# Patient Record
Sex: Female | Born: 1965 | Race: White | Hispanic: No | Smoking: Current every day smoker
Health system: Southern US, Community
[De-identification: ages and names within clinical notes are randomized; demographics above are authoritative.]

## PROBLEM LIST (undated history)

## (undated) VITALS — BP 94/63 | HR 89 | Temp 97.5°F | Resp 16

## (undated) DIAGNOSIS — F419 Anxiety disorder, unspecified: Secondary | ICD-10-CM

## (undated) DIAGNOSIS — F329 Major depressive disorder, single episode, unspecified: Secondary | ICD-10-CM

## (undated) DIAGNOSIS — N83209 Unspecified ovarian cyst, unspecified side: Secondary | ICD-10-CM

## (undated) DIAGNOSIS — F32A Depression, unspecified: Secondary | ICD-10-CM

## (undated) DIAGNOSIS — K219 Gastro-esophageal reflux disease without esophagitis: Secondary | ICD-10-CM

## (undated) DIAGNOSIS — O2230 Deep phlebothrombosis in pregnancy, unspecified trimester: Secondary | ICD-10-CM

## (undated) HISTORY — PX: APPENDECTOMY: SHX54

## (undated) HISTORY — DX: Depression, unspecified: F32.A

## (undated) HISTORY — DX: Gastro-esophageal reflux disease without esophagitis: K21.9

## (undated) HISTORY — DX: Anxiety disorder, unspecified: F41.9

## (undated) HISTORY — DX: Major depressive disorder, single episode, unspecified: F32.9

## (undated) HISTORY — PX: TONSILLECTOMY: SUR1361

---

## 2006-10-05 ENCOUNTER — Ambulatory Visit (HOSPITAL_COMMUNITY): Payer: Self-pay | Admitting: Psychiatry

## 2006-11-02 ENCOUNTER — Ambulatory Visit (HOSPITAL_COMMUNITY): Payer: Self-pay | Admitting: Psychiatry

## 2006-11-30 ENCOUNTER — Ambulatory Visit (HOSPITAL_COMMUNITY): Payer: Self-pay | Admitting: Psychiatry

## 2007-07-13 ENCOUNTER — Ambulatory Visit (HOSPITAL_COMMUNITY): Payer: Self-pay | Admitting: Psychiatry

## 2007-08-25 ENCOUNTER — Emergency Department (HOSPITAL_COMMUNITY): Admission: EM | Admit: 2007-08-25 | Discharge: 2007-08-25 | Payer: Self-pay | Admitting: Emergency Medicine

## 2007-08-27 ENCOUNTER — Emergency Department (HOSPITAL_COMMUNITY): Admission: EM | Admit: 2007-08-27 | Discharge: 2007-08-27 | Payer: Self-pay | Admitting: Emergency Medicine

## 2007-08-29 ENCOUNTER — Emergency Department (HOSPITAL_COMMUNITY): Admission: EM | Admit: 2007-08-29 | Discharge: 2007-08-29 | Payer: Self-pay | Admitting: Emergency Medicine

## 2007-08-29 ENCOUNTER — Inpatient Hospital Stay (HOSPITAL_COMMUNITY): Admission: AD | Admit: 2007-08-29 | Discharge: 2007-09-01 | Payer: Self-pay | Admitting: Psychiatry

## 2007-08-31 ENCOUNTER — Ambulatory Visit: Payer: Self-pay | Admitting: Psychiatry

## 2007-09-03 ENCOUNTER — Ambulatory Visit (HOSPITAL_COMMUNITY): Payer: Self-pay | Admitting: Psychiatry

## 2008-05-31 ENCOUNTER — Emergency Department (HOSPITAL_COMMUNITY): Admission: EM | Admit: 2008-05-31 | Discharge: 2008-05-31 | Payer: Self-pay | Admitting: Family Medicine

## 2008-06-24 ENCOUNTER — Emergency Department (HOSPITAL_COMMUNITY): Admission: EM | Admit: 2008-06-24 | Discharge: 2008-06-25 | Payer: Self-pay | Admitting: *Deleted

## 2008-09-29 ENCOUNTER — Emergency Department (HOSPITAL_COMMUNITY): Admission: EM | Admit: 2008-09-29 | Discharge: 2008-09-29 | Payer: Self-pay | Admitting: Family Medicine

## 2010-12-17 NOTE — H&P (Signed)
Kristina Owens, Kristina Owens NO.:  [id number]   MEDICAL RECORD NO.:  [id number]          PATIENT TYPE:  IPS   LOCATION:  0502                          FACILITY:  [facility]   PHYSICIAN:  Geoffery Lyons, M.D.      DATE OF BIRTH:  05-Mar-1966   DATE OF ADMISSION:  08/29/2007  DATE OF DISCHARGE:                       PSYCHIATRIC ADMISSION ASSESSMENT   IDENTIFYING INFORMATION:  A 45 year old divorced white female.  This is  a voluntary admission.   HISTORY OF PRESENT ILLNESS:  This 45 year old requested admission for  help detoxing from alcohol after 3 visits to the emergency room over the  course of the past week when she was trying to quit alcohol on her own.  She reports she had 18 months of abstinence from all substances until a  little more than a week ago when she had been suffering previously from  a dental abscess, had been prescribed Vicodin and completed the Vicodin.  At that poin, she began to develop acute cravings for alcohol and began  to drink.  Currently is drinking between 12 and 18 beers daily.  Having  some suicidal thoughts of possibly wanting to overdose on some  medications.  Did not really abuse the Vicodin.  Denies other substance  abuse   PAST PSYCHIATRIC HISTORY:  The patient is a current the patient of Jorje Guild, P.A., and Arrie Senate, her psychotherapist at [facility] outpatient clinic.  This is her first inpatient admission at [facility].  She has a history of one admission in [place], [place] for detox  preceding her 18 months of abstinence ending this past week.  Also,  another admission in December of 2005 to [facility] for 28 days,  followed by 4 months of abstinence.  She has been on naltrexone in the  past which helped her maintain abstinence.   SOCIAL HISTORY:  Divorced white female endorsing some chronic conflict  with her ex-husband, has a 54-1/2-year-old son who visits his father in  [place] in summers and an on school holidays.   Otherwise, lives full  time with the patient.  She is employed full-time by an Tour manager.  Is also a Physicist, medical at [employer]  Sister lives nearby  and is supportive and currently caring for her son.  The patient moved  to [place] a couple of years ago in order to be closer to family  after separating from her husband.  No legal charges.   FAMILY HISTORY:  Is remarkable for father with history of alcohol abuse  and depression.   ALCOHOL AND DRUG HISTORY:  Noted above.   MEDICAL HISTORY:  The patient currently followed by Dr. Merri Brunette.  Medical problems are none.   CURRENT MEDICATIONS:  1. Effexor XR 150 mg daily.  2. Lexapro 30 mg daily.  3. BuSpar 7.5 mg p.o. b.i.d.   Has been on this combination for several months, with the Effexor being  the last added medication. She also takes Depo-Provera injections every  3 months.   DRUG ALLERGIES:  Are none.   POSITIVE PHYSICAL FINDINGS:  Physical exam done in the emergency room:  She had been prescribed some Ativan in the emergency room this past week  to help with outpatient detox.  On admission to our unit, 5 feet 2  inches tall, 133 pounds, temperature 97.7, pulse 75, respirations 22,  blood pressure 135/93.  Physical exam unremarkable.  Does have a belly  button ring and a tattoo across her sacrum.   DIAGNOSTIC STUDIES:  Urine drug screen was positive for benzodiazepines,  negative for all other substances.  TSH is 1.951.  Routine urinalysis is  remarkable for 7-10 WBCs per high-powered field and moderate leukocytes,  trace blood, specific gravity is 1.012 and pH 5.50.  Urine pregnancy  test is negative.  Alcohol level was 6.  CBC:  WBC 10.6, hemoglobin 13,  hematocrit 38.2 and platelets 347,000.  Her MCV 86.6.  Chemistry:  Sodium 139, potassium 3.8, chloride 106, carbon dioxide 26, BUN 9,  creatinine 0.75, and random glucose 78.  Liver enzymes:  SGOT 23, SGPT  21, alkaline phosphatase 73, and total  bilirubin is 0.7.   MENTAL STATUS EXAM:  Fully alert female, pleasant cooperative, composed,  gives a coherent history.  Speech is normal in form, pace and tone.  Mood is depressed.  Thought process is remarkable for some passive  suicidal thoughts, is feeling that her addiction is controlling her  instead of her in control.  No active suicidal thoughts today, does not  have a plan, is deterred from suicide out of her concerns for her son,  does admit to doing some ruminating thought, constantly worrying about  her son even during periods of abstinence.  Cognition is preserved.  She  is oriented x4.  Insight adequate.  Impulse control and judgment within  normal limits.  Registration and attention are intact.  She is anxious  to re-achieve abstinence, felt good during abstinence, and generally  functioned quite well.  Wants to achieve that.  Regrets ever taking the  Vicodin.  Also would like to consider going back on the naltrexone.   AXIS I:  Depressive disorder, not otherwise specified.  EtOH abuse, rule  out dependence.  AXIS II:  Deferred.  AXIS III:  Pyuria, rule out urinary tract infection.  AXIS IV:  Moderate chronic parenting stress.  AXIS V:  Current is 45.  Past year 68.   PLAN:  Is to voluntarily admit the patient with every 15-minute checks  in place with a goal of a safe detox.  We are going to continue her  current antidepressant regimen and will start her on naltrexone 25 mg  daily.  We have placed her on a Librium detox protocol which she is  tolerating well and have started her on naltrexone 25 mg daily and will  add Risperdal 0.25 mg b.i.d. to help her with ruminating thoughts.  Estimated length of stay is 4 days.      Margaret A. Scott, N.P.      Geoffery Lyons, M.D.  Electronically Signed    MAS/MEDQ  D:  08/30/2007  T:  08/31/2007  Job:  [id number]

## 2010-12-20 NOTE — Discharge Summary (Signed)
Kristina Owens, Kristina Owens           ACCOUNT NO.:  [id number]   MEDICAL RECORD NO.:  [id number]          PATIENT TYPE:  IPS   LOCATION:  0502                          FACILITY:  BH   PHYSICIAN:  Geoffery Lyons, M.D.      DATE OF BIRTH:  July 18, 1966   DATE OF ADMISSION:  08/29/2007  DATE OF DISCHARGE:  09/01/2007                               DISCHARGE SUMMARY   CHIEF COMPLAINT AND HISTORY OF PRESENT ILLNESS:  This was the first  admission to [facility] for this 45 year old divorced  white female admitted for detox from alcohol after three visits to the  ED when she was trying to quit alcohol on her own.  She had in the past  18 months of abstinence until a little more than a week ago when she had  been suffering previously from a dental abscess.  She was prescribed  Vicodin, completed the Vicodin course and at that time started having  cravings for alcohol and began to drink, drinking between 12-18 beers.   PAST PSYCHIATRIC HISTORY:  Active at [facility]  outpatient clinic, seeing Heron Sabins and Jorje Guild.  Prior admission in  [place], [place], for detox preceding her 18 months of abstinence.   SOCIAL HISTORY:  As stated, persistent use of alcohol after 18 months of  abstinence.   MEDICAL HISTORY:  Noncontributory.   MEDICATIONS:  1. Effexor XR 150 mg per day.  2. Lexapro 30 mg per day.  3. BuSpar 7.5 twice a day.   PHYSICAL EXAMINATION:  Exam failed to show any acute findings.   LABORATORY WORK:  White blood cells 10.6, hemoglobin 13.0, sodium 139,  potassium 3.8, glucose 78, SGOT 23, SGPT 21, total bilirubin 0.7, TSH  1.951, drug screen positive for benzodiazepines.   MENTAL STATUS EXAM:  On admission revealed a female that was  cooperative.  Speech was normal in tempo and production.  Mood was  depressed.  Affect was depressed.  Thought processes were logical,  coherent and relevant.  There were some passive suicidal thoughts,  feeling that  her addiction was controlling her instead of her being in  control, endorsed persistent worrying, ruminating about her son.  No  delusions.  No active suicidal ideas, no hallucinations.  Cognition well-  preserved.   ADMITTING DIAGNOSIS:  AXIS I:  Depressive disorder not otherwise  specified.  Rule out mood disorder NOS.  Alcohol dependence.  AXIS II:  No diagnosis.  AXIS III:  No diagnosis.  AXIS IV:  Moderate.  AXIS V:  Upon admission 35, highest GAF in the last year 70.   COURSE IN THE HOSPITAL:  She was admitted, started in individual and  group psychotherapy.  She was detoxified with Librium.  Did endorse  increased use of alcohol January 1 with this gum infection and given  Vicodin and this seemed to trigger the relapse on alcohol.  Has a 80-1/2-  year-old son in fifth grade.  There is some conflict with the ex-  husband.  Had been in the mortgage business for 13 years, taking online  courses for substance abuse, had been  seeing Heron Sabins for the whole  year, just moved from [place] and was active in [place] and had been in  [place], [place], Risk manager.  We started the Risperdal to see if  it would help with the ruminations.  She continued to worry that  something was going to happen, could get to be incapacitated and she  would not do anything due to the same.  We increased the BuSpar to 15  twice a day.  January 28 she endorsed she was better, wanting to go home  with the sister and continuing to work on abstinence, encouraged by the  possibility of the medication helping with the anxiety.  There was a  family session on this same date that went well.  There were no concerns  about safety.  She was fully detoxed.  We went ahead and discharged to  outpatient followup.   DISCHARGE DIAGNOSIS:  AXIS I:  Alcohol dependence, anxiety disorder not  otherwise specified, depressive disorder not otherwise specified.  AXIS II:  No diagnosis.  AXIS III:  No diagnosis.  AXIS IV:   Moderate.  AXIS V:  On discharge 50/55.   Discharged on Revia 50 mg per day, BuSpar 50 mg twice a day, Risperdal  0.5 twice a day, Effexor XR 150 mg per day, Lexapro 30 mg per day,  Librium 25 one the following day to complete the Librium detox protocol.  Follow up at the [facility].      Geoffery Lyons, M.D.  Electronically Signed     IL/MEDQ  D:  09/26/2007  T:  09/27/2007  Job:  [id number]

## 2011-04-09 ENCOUNTER — Emergency Department: Payer: Self-pay | Admitting: Emergency Medicine

## 2011-04-25 LAB — COMPREHENSIVE METABOLIC PANEL
ALT: 21
AST: 23
Albumin: 4.1
Alkaline Phosphatase: 73
BUN: 9
CO2: 26
Calcium: 9
Chloride: 106
Creatinine, Ser: 0.75
GFR calc Af Amer: >60
GFR calc non Af Amer: >60
Glucose, Bld: 78
Potassium: 3.8
Sodium: 139
Total Bilirubin: 0.7
Total Protein: 6.8

## 2011-04-25 LAB — BENZODIAZEPINE, QUANTITATIVE, URINE
Alprazolam (GC/LC/MS), ur confirm: NEGATIVE
Flurazepam GC/MS Conf: NEGATIVE
Nordiazepam GC/MS Conf: 110 ng/mL
Oxazepam GC/MS Conf: 130 ng/mL

## 2011-04-25 LAB — DRUGS OF ABUSE SCREEN W/O ALC, ROUTINE URINE
Amphetamine Screen, Ur: NEGATIVE
Barbiturate Quant, Ur: NEGATIVE
Benzodiazepines.: POSITIVE — AB
Cocaine Metabolites: NEGATIVE
Creatinine,U: 106
Marijuana Metabolite: NEGATIVE
Methadone: NEGATIVE
Opiate Screen, Urine: NEGATIVE
Phencyclidine (PCP): NEGATIVE
Propoxyphene: NEGATIVE

## 2011-04-25 LAB — DIFFERENTIAL
Basophils Absolute: 0
Basophils Relative: 0
Eosinophils Absolute: 0.6
Eosinophils Relative: 6 — ABNORMAL HIGH
Lymphocytes Relative: 45
Lymphs Abs: 3.9
Monocytes Absolute: 0.7
Monocytes Relative: 8
Neutro Abs: 3.6
Neutrophils Relative %: 41 — ABNORMAL LOW

## 2011-04-25 LAB — CBC
HCT: 38.2
HCT: 38.8
Hemoglobin: 13
Hemoglobin: 13.4
MCHC: 34.1
MCHC: 34.6
MCV: 86.1
MCV: 86.6
Platelets: 347
Platelets: 369
RBC: 4.4
RBC: 4.5
RDW: 13.3
RDW: 13.3
WBC: 10.6 — ABNORMAL HIGH
WBC: 8.7

## 2011-04-25 LAB — URINALYSIS, ROUTINE W REFLEX MICROSCOPIC
Bilirubin Urine: NEGATIVE
Glucose, UA: NEGATIVE
Ketones, ur: NEGATIVE
Nitrite: NEGATIVE
Protein, ur: NEGATIVE
Specific Gravity, Urine: 1.012
Urobilinogen, UA: 0.2
pH: 5.5

## 2011-04-25 LAB — URINE MICROSCOPIC-ADD ON

## 2011-04-25 LAB — ETHANOL
Alcohol, Ethyl (B): 247 — ABNORMAL HIGH
Alcohol, Ethyl (B): 6

## 2011-04-25 LAB — TSH: TSH: 1.951

## 2011-04-25 LAB — PREGNANCY, URINE: Preg Test, Ur: NEGATIVE

## 2011-05-05 ENCOUNTER — Emergency Department (HOSPITAL_COMMUNITY)
Admission: EM | Admit: 2011-05-05 | Discharge: 2011-05-06 | Disposition: A | Discharge status: Other Acute Inpatient Hospital | Payer: 59 | Attending: Emergency Medicine | Admitting: Emergency Medicine

## 2011-05-05 ENCOUNTER — Ambulatory Visit (HOSPITAL_COMMUNITY): Admission: RE | Admit: 2011-05-05 | Payer: 59 | Source: Home / Self Care | Admitting: Psychiatry

## 2011-05-05 DIAGNOSIS — Z046 Encounter for general psychiatric examination, requested by authority: Secondary | ICD-10-CM | POA: Insufficient documentation

## 2011-05-05 DIAGNOSIS — F329 Major depressive disorder, single episode, unspecified: Secondary | ICD-10-CM | POA: Insufficient documentation

## 2011-05-05 DIAGNOSIS — F3289 Other specified depressive episodes: Secondary | ICD-10-CM | POA: Insufficient documentation

## 2011-05-05 DIAGNOSIS — F1010 Alcohol abuse, uncomplicated: Secondary | ICD-10-CM | POA: Insufficient documentation

## 2011-05-05 DIAGNOSIS — R259 Unspecified abnormal involuntary movements: Secondary | ICD-10-CM | POA: Insufficient documentation

## 2011-05-05 LAB — RAPID URINE DRUG SCREEN, HOSP PERFORMED
Amphetamines: NOT DETECTED
Barbiturates: NOT DETECTED
Benzodiazepines: NOT DETECTED
Cocaine: NOT DETECTED
Opiates: NOT DETECTED
Tetrahydrocannabinol: NOT DETECTED

## 2011-05-05 LAB — CBC
HCT: 44.9 % (ref 36.0–46.0)
Hemoglobin: 15.2 g/dL — ABNORMAL HIGH (ref 12.0–15.0)
MCH: 30.5 pg (ref 26.0–34.0)
MCHC: 33.9 g/dL (ref 30.0–36.0)
MCV: 90 fL (ref 78.0–100.0)
Platelets: 299 10*3/uL (ref 150–400)
RBC: 4.99 MIL/uL (ref 3.87–5.11)
RDW: 12.4 % (ref 11.5–15.5)
WBC: 7.6 10*3/uL (ref 4.0–10.5)

## 2011-05-05 LAB — COMPREHENSIVE METABOLIC PANEL
ALT: 19 U/L (ref 0–35)
AST: 18 U/L (ref 0–37)
Albumin: 4.1 g/dL (ref 3.5–5.2)
Alkaline Phosphatase: 90 U/L (ref 39–117)
BUN: 8 mg/dL (ref 6–23)
CO2: 25 mEq/L (ref 19–32)
Calcium: 8.8 mg/dL (ref 8.4–10.5)
Chloride: 109 mEq/L (ref 96–112)
Creatinine, Ser: 0.63 mg/dL (ref 0.50–1.10)
GFR calc Af Amer: >90 mL/min (ref >90)
GFR calc non Af Amer: >90 mL/min (ref >90)
Glucose, Bld: 85 mg/dL (ref 70–99)
Potassium: 4.3 mEq/L (ref 3.5–5.1)
Sodium: 144 mEq/L (ref 135–145)
Total Bilirubin: 0.1 mg/dL — ABNORMAL LOW (ref 0.3–1.2)
Total Protein: 7.5 g/dL (ref 6.0–8.3)

## 2011-05-05 LAB — ETHANOL: Alcohol, Ethyl (B): 279 mg/dL — ABNORMAL HIGH (ref 0–11)

## 2011-05-05 LAB — DIFFERENTIAL
Basophils Absolute: 0 10*3/uL (ref 0.0–0.1)
Basophils Relative: 0 % (ref 0–1)
Eosinophils Absolute: 0.3 10*3/uL (ref 0.0–0.7)
Eosinophils Relative: 3 % (ref 0–5)
Lymphocytes Relative: 43 % (ref 12–46)
Lymphs Abs: 3.2 10*3/uL (ref 0.7–4.0)
Monocytes Absolute: 0.4 10*3/uL (ref 0.1–1.0)
Monocytes Relative: 5 % (ref 3–12)
Neutro Abs: 3.7 10*3/uL (ref 1.7–7.7)
Neutrophils Relative %: 48 % (ref 43–77)

## 2011-05-06 ENCOUNTER — Inpatient Hospital Stay (HOSPITAL_COMMUNITY)
Admission: AD | Admit: 2011-05-06 | Discharge: 2011-05-10 | DRG: 897 | Disposition: A | Discharge status: Home / Self Care | Payer: 59 | Source: Ambulatory Visit | Attending: Psychiatry | Admitting: Psychiatry

## 2011-05-06 DIAGNOSIS — F1320 Sedative, hypnotic or anxiolytic dependence, uncomplicated: Secondary | ICD-10-CM

## 2011-05-06 DIAGNOSIS — F429 Obsessive-compulsive disorder, unspecified: Secondary | ICD-10-CM

## 2011-05-06 DIAGNOSIS — F0781 Postconcussional syndrome: Secondary | ICD-10-CM

## 2011-05-06 DIAGNOSIS — F39 Unspecified mood [affective] disorder: Secondary | ICD-10-CM

## 2011-05-06 DIAGNOSIS — F1020 Alcohol dependence, uncomplicated: Principal | ICD-10-CM

## 2011-05-06 DIAGNOSIS — Z6379 Other stressful life events affecting family and household: Secondary | ICD-10-CM

## 2011-05-06 DIAGNOSIS — F17200 Nicotine dependence, unspecified, uncomplicated: Secondary | ICD-10-CM

## 2011-05-06 LAB — ETHANOL: Alcohol, Ethyl (B): <11 mg/dL (ref 0–11)

## 2011-05-07 DIAGNOSIS — F1020 Alcohol dependence, uncomplicated: Secondary | ICD-10-CM

## 2011-05-07 DIAGNOSIS — F429 Obsessive-compulsive disorder, unspecified: Secondary | ICD-10-CM

## 2011-05-07 DIAGNOSIS — F1320 Sedative, hypnotic or anxiolytic dependence, uncomplicated: Secondary | ICD-10-CM

## 2011-05-11 ENCOUNTER — Inpatient Hospital Stay: Payer: Self-pay | Admitting: Psychiatry
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
## 2011-07-13 ENCOUNTER — Encounter: Payer: Self-pay | Admitting: Emergency Medicine

## 2011-07-13 ENCOUNTER — Emergency Department (HOSPITAL_COMMUNITY)
Admission: EM | Admit: 2011-07-13 | Discharge: 2011-07-14 | Disposition: A | Discharge status: Home / Self Care | Payer: 59 | Attending: Emergency Medicine | Admitting: Emergency Medicine

## 2011-07-13 DIAGNOSIS — R45851 Suicidal ideations: Secondary | ICD-10-CM | POA: Insufficient documentation

## 2011-07-13 DIAGNOSIS — F1020 Alcohol dependence, uncomplicated: Secondary | ICD-10-CM | POA: Insufficient documentation

## 2011-07-13 LAB — CBC
HCT: 40.8 % (ref 36.0–46.0)
Hemoglobin: 14.1 g/dL (ref 12.0–15.0)
MCH: 29.4 pg (ref 26.0–34.0)
MCHC: 34.6 g/dL (ref 30.0–36.0)
MCV: 85.2 fL (ref 78.0–100.0)
Platelets: 303 10*3/uL (ref 150–400)
RBC: 4.79 MIL/uL (ref 3.87–5.11)
RDW: 12.8 % (ref 11.5–15.5)
WBC: 8.6 10*3/uL (ref 4.0–10.5)

## 2011-07-13 LAB — COMPREHENSIVE METABOLIC PANEL
ALT: 15 U/L (ref 0–35)
AST: 17 U/L (ref 0–37)
Albumin: 4.5 g/dL (ref 3.5–5.2)
Alkaline Phosphatase: 89 U/L (ref 39–117)
BUN: 9 mg/dL (ref 6–23)
CO2: 24 mEq/L (ref 19–32)
Calcium: 9.4 mg/dL (ref 8.4–10.5)
Chloride: 107 mEq/L (ref 96–112)
Creatinine, Ser: 0.66 mg/dL (ref 0.50–1.10)
GFR calc Af Amer: >90 mL/min (ref >90)
GFR calc non Af Amer: >90 mL/min (ref >90)
Glucose, Bld: 81 mg/dL (ref 70–99)
Potassium: 4.1 mEq/L (ref 3.5–5.1)
Sodium: 142 mEq/L (ref 135–145)
Total Bilirubin: 0.1 mg/dL — ABNORMAL LOW (ref 0.3–1.2)
Total Protein: 7.9 g/dL (ref 6.0–8.3)

## 2011-07-13 LAB — RAPID URINE DRUG SCREEN, HOSP PERFORMED
Amphetamines: NOT DETECTED
Barbiturates: NOT DETECTED
Benzodiazepines: NOT DETECTED
Cocaine: NOT DETECTED
Opiates: NOT DETECTED
Tetrahydrocannabinol: NOT DETECTED

## 2011-07-13 LAB — ETHANOL: Alcohol, Ethyl (B): 255 mg/dL — ABNORMAL HIGH (ref 0–11)

## 2011-07-13 MED ORDER — HYDROCODONE-ACETAMINOPHEN 5-325 MG PO TABS: 1.0000 | ORAL_TABLET | Freq: Four times a day (QID) | ORAL | Status: DC | PRN

## 2011-07-13 MED ORDER — LORAZEPAM 1 MG PO TABS
1.0000 mg | ORAL_TABLET | Freq: Three times a day (TID) | ORAL | Status: DC | PRN
  Administered 2011-07-14: 1 mg via ORAL
  Filled 2011-07-13 (×2): qty 1

## 2011-07-13 MED ORDER — ALUM & MAG HYDROXIDE-SIMETH 200-200-20 MG/5ML PO SUSP: 30.0000 mL | ORAL | Status: DC | PRN

## 2011-07-13 MED ORDER — ZOLPIDEM TARTRATE 5 MG PO TABS: 5.0000 mg | ORAL_TABLET | Freq: Every evening | ORAL | Status: DC | PRN

## 2011-07-13 MED ORDER — IBUPROFEN 600 MG PO TABS: 600.0000 mg | ORAL_TABLET | Freq: Three times a day (TID) | ORAL | Status: DC | PRN

## 2011-07-13 MED ORDER — ACETAMINOPHEN 325 MG PO TABS: 650.0000 mg | ORAL_TABLET | ORAL | Status: DC | PRN

## 2011-07-13 MED ORDER — ONDANSETRON HCL 4 MG PO TABS
4.0000 mg | ORAL_TABLET | Freq: Three times a day (TID) | ORAL | Status: DC | PRN
  Administered 2011-07-14: 4 mg via ORAL
  Filled 2011-07-13: qty 1

## 2011-07-13 MED ORDER — LORAZEPAM 1 MG PO TABS
1.0000 mg | ORAL_TABLET | Freq: Once | ORAL | Status: AC
  Administered 2011-07-13: 1 mg via ORAL
  Filled 2011-07-13: qty 1

## 2011-07-13 MED ORDER — NICOTINE 21 MG/24HR TD PT24
21.0000 mg | MEDICATED_PATCH | Freq: Every day | TRANSDERMAL | Status: DC
  Administered 2011-07-13: 21 mg via TRANSDERMAL
  Filled 2011-07-13: qty 1

## 2011-07-13 NOTE — ED Notes (Signed)
ETOH, "I think I have had to much to drink, and I need some help".

## 2011-07-13 NOTE — ED Notes (Signed)
FAO:[id number]<VH> Expected date:07/13/11<BR> Expected time: 6:23 PM<BR> Means of arrival:Ambulance<BR> Comments:<BR> EMS 12 GC - suicidal

## 2011-07-13 NOTE — ED Notes (Signed)
Report given to Julie-RN- pt was clearedby security  belonging bag x 2

## 2011-07-13 NOTE — ED Provider Notes (Addendum)
History     CSN: [id number] Arrival date & time: 07/13/2011  6:35 PM   First MD Initiated Contact with Patient 07/13/11 2042      Chief Complaint  Patient presents with  . Alcohol Problem    (Consider location/radiation/quality/duration/timing/severity/associated sxs/prior treatment) HPI Comments: Kristina Owens is an alcoholic, who had one month of sobriety [facility].  She did not follow through with AA meetings prior to obtaining a sponsor.  She now is drinking more eating, lasts and is contemplating suicide.   Patient is a 45 y.o. female presenting with alcohol problem. The history is provided by the patient.  Alcohol Problem This is a chronic problem. The current episode started more than 1 year ago. The problem occurs constantly. The problem has been unchanged. Pertinent negatives include no abdominal pain, anorexia, arthralgias, change in bowel habit, chest pain, chills, fatigue, fever, headaches, nausea or vomiting. The symptoms are aggravated by nothing. She has tried nothing for the symptoms. The treatment provided no relief.    No past medical history on file.  No past surgical history on file.  No family history on file.  History  Substance Use Topics  . Smoking status: Not on file  . Smokeless tobacco: Not on file  . Alcohol Use: Not on file    OB History    Grav Para Term Preterm Abortions TAB SAB Ect Mult Living                  Review of Systems  Constitutional: Negative for fever, chills and fatigue.  Eyes: Negative.   Respiratory: Negative.   Cardiovascular: Negative for chest pain.  Gastrointestinal: Negative for nausea, vomiting, abdominal pain, anorexia and change in bowel habit.  Musculoskeletal: Negative for arthralgias.  Skin: Negative.   Neurological: Negative for headaches.  Psychiatric/Behavioral: Positive for suicidal ideas.    Allergies  Review of patient's allergies indicates no known  allergies.  Home Medications   Current Outpatient Rx  Name Route Sig Dispense Refill  . ARIPIPRAZOLE 10 MG PO TABS Oral Take 10 mg by mouth daily.      Marland Kitchen ESCITALOPRAM OXALATE 20 MG PO TABS Oral Take 30 mg by mouth daily. Pt takes 1.5 tabs for 30mg  dose     . MULTI-VITAMIN/MINERALS PO TABS Oral Take 1 tablet by mouth daily.        BP 113/70  Pulse 94  Temp(Src) 98 F (36.7 C) (Oral)  Resp 20  Wt 142 lb (64.411 kg)  SpO2 98%  Physical Exam  Constitutional: She is oriented to person, place, and time. She appears well-developed and well-nourished.  HENT:  Head: Normocephalic.  Eyes: Pupils are equal, round, and reactive to light.  Neck: Neck supple.  Cardiovascular: Normal rate.   Pulmonary/Chest: Effort normal.  Abdominal: Soft.  Musculoskeletal: Normal range of motion.  Neurological: She is oriented to person, place, and time.  Skin: Skin is warm and dry.  Psychiatric: Her behavior is normal. Thought content normal. Her mood appears anxious. Her speech is not rapid and/or pressured and not slurred. Cognition and memory are normal. She expresses impulsivity. She is communicative.    ED Course  Procedures (including critical care time)  Labs Reviewed  COMPREHENSIVE METABOLIC PANEL - Abnormal; Notable for the following:    Total Bilirubin 0.1 (*)    All other components within normal limits  ETHANOL - Abnormal; Notable for the following:    Alcohol, Ethyl (B) 255 (*)    All  other components within normal limits  URINE RAPID DRUG SCREEN (HOSP PERFORMED)  CBC   No results found.   1. Alcohol dependence    Patient discharged to home after Dr. Paris Lore.  Evaluation  Seen by activity reassessed in the morning for possible placement in rehabilitation.  Facility. MDM  She states she wants to home to take care of her dogs, although she does have someone who can care for them discussed at length.  The importance of her taking care of herself.  The fact that she can not leave at  this point since she voiced suicidality.  She is willing to stay and be assessed.        Arman Filter, NP 07/13/11 1610  Arman Filter, NP 07/14/11 0630  Arman Filter, NP 07/14/11 2006

## 2011-07-13 NOTE — ED Notes (Signed)
PTA pt gave class ring to friend, Kristina Owens (sp)

## 2011-07-13 NOTE — ED Notes (Signed)
Pt states that she is not suicidal and does not want to stay here tonight. Dr. Preston Fleeting informed and he states that pt made statements on the phone to someone that she wanted to kill herself. Dr. Preston Fleeting states that if pt wants to leave she will be IVC'd.

## 2011-07-14 DIAGNOSIS — F1020 Alcohol dependence, uncomplicated: Secondary | ICD-10-CM

## 2011-07-14 DIAGNOSIS — F429 Obsessive-compulsive disorder, unspecified: Secondary | ICD-10-CM

## 2011-07-14 MED ORDER — LORAZEPAM 2 MG/ML IJ SOLN: 1.0000 mg | Freq: Four times a day (QID) | INTRAMUSCULAR | Status: DC | PRN

## 2011-07-14 MED ORDER — LORAZEPAM 1 MG PO TABS: 1.0000 mg | ORAL_TABLET | Freq: Four times a day (QID) | ORAL | Status: DC | PRN

## 2011-07-14 NOTE — Consult Note (Signed)
Patient Identification:  Kristina Owens Date of Evaluation:  07/14/2011   History of Present Illness:  Patient seen in the medical records reviewed. 45 year old Caucasian female who came to get the detox and further help from alcohol abuse. But reported to me today that she is feeling much better she [facility] and she know how to stay sober and wants to followup in the outpatient setting. Patient works in a bank and told me that by being admitted in the hospital she don't want to jeopardize her job. Patient currently has no withdrawal symptoms. She reported that she drinks 1-2 bottles of wine daily. Patient denied any past suicide attempt and she told me she is never going to hurt herself.   Her labs are within normal limits.  Mental Status Examination: Calm cooperative pleasant on approach. Not suicidal homicidal. Not hallucinating or delusional. Attention concentration good memory is good. Attention concentration good abstract ability intact insight and judgment intact.  Patient signed a Engineer, manufacturing systems and I told the counselor to go over all the suicide precautions and gave her all the option to followup in the outpatient setting.  Patient takes Abilify and Lexapro for OCD and follow psychiatrists in the outpatient setting regularly. 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   Past Medical History:    No past medical history on file.    No past surgical history on file.  Allergies: No Known Allergies  Current Medications:  Prior to Admission medications   Medication Sig Start Date End Date Taking? Authorizing Provider  ARIPiprazole (ABILIFY) 10 MG tablet Take 10 mg by mouth daily.     Yes Historical Provider, MD  escitalopram (LEXAPRO) 20 MG tablet Take 30 mg by mouth daily. Pt takes 1.5 tabs for 30mg  dose    Yes Historical Provider, MD  Multiple Vitamins-Minerals (MULTIVITAMIN WITH MINERALS) tablet Take 1 tablet by mouth daily.     Yes  Historical Provider, MD    Social History:    does not have a smoking history on file. She does not have any smokeless tobacco history on file. Her alcohol and drug histories not on file.   Family History:    No family history on file.   DIAGNOSIS:   AXIS I  chronic alcohol dependence, OCD   AXIS II  Deffered  AXIS III See medical notes.  AXIS IV  alcohol abuse   AXIS V 55     Recommendations:  Patient does not meet criteria to be admitted on  IVC she wants to be discharged to followup in the outpatient setting at this time patient mental status is stable and can be discharged to followup in the outpatient setting.   Eulogio Ditch, MD

## 2011-07-14 NOTE — ED Notes (Signed)
Patient signed no harm contract and was given outpatient referrals.

## 2011-07-14 NOTE — ED Provider Notes (Signed)
Evaluation and management procedures were performed by the PA/NP under my supervision/collaboration.   Dione Booze, MD 07/14/11 [id number]

## 2011-07-14 NOTE — BH Assessment (Signed)
Assessment Note   Kristina Owens is an 45 y.o. female. PT PRESENT INTOXICATED REQUESTING HELP & NEEDS DETOX. PT STATES SHE DRINKS TO DEAL WITH STRESSOR. PT DENIES ANY IDEATION EVENTHOUGHT EDP EXPRESSED SHE HEARD PT VOICE SUICIDAL IDEATION TO SISTER. PT ADMITS BEING UNDER A LOT FOR STRESS WITH FINANCIAL ISSUES. PT SAYS SHE HAS BEEN TO [facility] ADMISSION THEN [facility] MONTH[facility]. PT EXPRESSED THAT SHE DID NOT WANT TO CONTINUE LIVING THIS WAY & FELT HER DRINKING TENDS TO DEPRESS HER THE MORE. PT ADMITS TO DRINKING 2 BIG BOTTLE OF WINE DAILY & HAD 4 SMALL BOTTLE TODAY.  Axis I: Alcohol Abuse and Mood Disorder NOS Axis II: Deferred Axis III: No past medical history on file. Axis IV: problems related to social environment and FINANCIAL ISSUES Axis V: 41-50 serious symptoms  Past Medical History: No past medical history on file.  No past surgical history on file.  Family History: No family history on file.  Social History:  does not have a smoking history on file. She does not have any smokeless tobacco history on file. Her alcohol and drug histories not on file.  Additional Social History:    Allergies: No Known Allergies  Home Medications:  Medications Prior to Admission  Medication Dose Route Frequency Provider Last Rate Last Dose  . acetaminophen (TYLENOL) tablet 650 mg  650 mg Oral Q4H PRN Dione Booze, MD      . alum & mag hydroxide-simeth (MAALOX/MYLANTA) 200-200-20 MG/5ML suspension 30 mL  30 mL Oral PRN Dione Booze, MD      . HYDROcodone-acetaminophen [facility]) 5-325 MG per tablet 1 tablet  1 tablet Oral Q6H PRN Dione Booze, MD      . ibuprofen (ADVIL,MOTRIN) tablet 600 mg  600 mg Oral Q8H PRN Dione Booze, MD      . LORazepam (ATIVAN) tablet 1 mg  1 mg Oral Q6H PRN Arman Filter, NP       Or  . LORazepam (ATIVAN) injection 1 mg  1 mg Intravenous Q6H PRN Arman Filter, NP      . LORazepam (ATIVAN) tablet 1 mg  1 mg Oral Once Dione Booze, MD   1 mg at 07/13/11 2039  .  LORazepam (ATIVAN) tablet 1 mg  1 mg Oral Q8H PRN Dione Booze, MD   1 mg at 07/14/11 0957  . nicotine (NICODERM CQ - dosed in mg/24 hours) patch 21 mg  21 mg Transdermal Daily Dione Booze, MD   21 mg at 07/13/11 2218  . ondansetron (ZOFRAN) tablet 4 mg  4 mg Oral Q8H PRN Dione Booze, MD   4 mg at 07/14/11 0735  . zolpidem (AMBIEN) tablet 5 mg  5 mg Oral QHS PRN Dione Booze, MD       Medications Prior to Admission  Medication Sig Dispense Refill  . ARIPiprazole (ABILIFY) 10 MG tablet Take 10 mg by mouth daily.        Marland Kitchen escitalopram (LEXAPRO) 20 MG tablet Take 30 mg by mouth daily. Pt takes 1.5 tabs for 30mg  dose       . Multiple Vitamins-Minerals (MULTIVITAMIN WITH MINERALS) tablet Take 1 tablet by mouth daily.        . QUEtiapine (SEROQUEL) 25 MG tablet Take 25 mg by mouth at bedtime.          OB/GYN Status:  No LMP recorded.  General Assessment Data Assessment Number: 1  Living Arrangements: Alone Can pt return to current living arrangement?: Yes Admission Status: Voluntary  Is patient capable of signing voluntary admission?: Yes Transfer from: Acute Hospital Referral Source: MD     Risk to self Suicidal Ideation: No Suicidal Intent: No Is patient at risk for suicide?: Yes Suicidal Plan?: No Access to Means: No What has been your use of drugs/alcohol within the last 12 months?: PT STARTED DRINKING AT AGE 64 & DRINKS 2 BOTTLES OF WINE DAILY, LAST DRINK WAS TODAY WHEN SHE DRANK 4 SMALL BOTTLE OF WINE. Previous Attempts/Gestures: No How many times?: 0  Other Self Harm Risks: NA Triggers for Past Attempts: Other (Comment) (FINANCIAL ISSUES, LIMITED SUPPORT) Intentional Self Injurious Behavior: None Family Suicide History: See progress notes (SISER HAS DEPRESSION) Recent stressful life event(s): Financial Problems;Turmoil (Comment) Persecutory voices/beliefs?: No Depression: Yes Depression Symptoms: Loss of interest in usual pleasures;Feeling worthless/self pity Substance abuse  history and/or treatment for substance abuse?: Yes Suicide prevention information given to non-admitted patients: Not applicable  Risk to Others Homicidal Ideation: No Thoughts of Harm to Others: No Current Homicidal Intent: No Current Homicidal Plan: No Access to Homicidal Means: No Identified Victim: NA History of harm to others?: No Assessment of Violence: None Noted Violent Behavior Description: COOPERATIVE Does patient have access to weapons?: No Criminal Charges Pending?: No Does patient have a court date: No  Psychosis Hallucinations: None noted Delusions: None noted  Mental Status Report Appear/Hygiene: Other (Comment) (NEAT) Eye Contact: Fair Motor Activity: Other (Comment) (NORMAL) Speech: Other (Comment) (NORMAL) Level of Consciousness: Alert Mood: Depressed;Despair;Sad Affect: Depressed;Other (Comment) Anxiety Level: None Thought Processes: Coherent;Relevant Judgement: Impaired Orientation: Person;Place;Time;Situation Obsessive Compulsive Thoughts/Behaviors: None  Cognitive Functioning Concentration: Decreased Memory: Recent Intact;Remote Intact IQ: Average Insight: Poor Impulse Control: Poor Appetite: Poor Weight Loss: 0  Weight Gain: 0  Sleep: Decreased Total Hours of Sleep: 3  Vegetative Symptoms: None  Prior Inpatient Therapy Prior Inpatient Therapy: Yes Prior Therapy Dates: 2012, 2011 Prior Therapy Facilty/Provider(s): [facility], [facility] Reason for Treatment: DETOX & REHAB  Prior Outpatient Therapy Prior Outpatient Therapy: Yes Prior Therapy Dates: CURRENT Prior Therapy Facilty/Provider(s): DR. Petra Kuba Reason for Treatment: MED MANAGEMENT                     Additional Information 1:1 In Past 12 Months?: No CIRT Risk: No Elopement Risk: No Does patient have medical clearance?: Yes     Disposition:  Disposition Disposition of Patient: Inpatient treatment program;Referred to [facility]) Type of inpatient treatment program:  Adult  On Site Evaluation by:   Reviewed with Physician:     Waldron Session 07/14/2011 11:21 AM

## 2017-06-05 DIAGNOSIS — E876 Hypokalemia: Secondary | ICD-10-CM | POA: Insufficient documentation

## 2018-12-02 ENCOUNTER — Encounter: Payer: Self-pay | Admitting: Physician Assistant

## 2018-12-02 ENCOUNTER — Ambulatory Visit: Payer: Self-pay | Admitting: Physician Assistant

## 2018-12-02 ENCOUNTER — Other Ambulatory Visit: Payer: Self-pay

## 2018-12-02 VITALS — BP 100/64 | HR 78 | Temp 97.9°F

## 2018-12-02 DIAGNOSIS — F1911 Other psychoactive substance abuse, in remission: Secondary | ICD-10-CM

## 2018-12-02 DIAGNOSIS — Z7689 Persons encountering health services in other specified circumstances: Secondary | ICD-10-CM

## 2018-12-02 DIAGNOSIS — F1011 Alcohol abuse, in remission: Secondary | ICD-10-CM

## 2018-12-02 DIAGNOSIS — Z86010 Personal history of colonic polyps: Secondary | ICD-10-CM

## 2018-12-02 DIAGNOSIS — F39 Unspecified mood [affective] disorder: Secondary | ICD-10-CM

## 2018-12-02 DIAGNOSIS — F17200 Nicotine dependence, unspecified, uncomplicated: Secondary | ICD-10-CM

## 2018-12-02 NOTE — Progress Notes (Signed)
BP 100/64   Pulse 78   Temp 97.9 F (36.6 C)   SpO2 98%    Subjective:    Patient ID: Kristina Owens, female    DOB: [birth year], 53 y.o.   MRN: [id number]  HPI: Kristina Owens is a 53 y.o. female presenting on 12/02/2018 for New Patient (Initial Visit)   HPI  Chief Complaint  Patient presents with  . New Patient (Initial Visit)    Pt [facility] about a week ago. (alcohol and crack)   [facility]  [place] [facility].    She says her Last PAP was about 2 yr ago and her Last mammogram about 3 yr ago  She says she had Polyps on colonoscopy at age 53 or 89.  She had test early Due to her father had colon cancer (but died of thyroid cancer).  That was done she doesn't know where/what facility.       Relevant past medical, surgical, family and social history reviewed and updated as indicated. Interim medical history since our last visit reviewed. Allergies and medications reviewed and updated.   Current Outpatient Medications:  .  ARIPiprazole (ABILIFY) 10 MG tablet, Take 10 mg by mouth daily.  , Disp: , Rfl:  .  escitalopram (LEXAPRO) 20 MG tablet, Take 20 mg by mouth daily. Pt takes 1.5 tabs for 30mg  dose , Disp: , Rfl:  .  GABAPENTIN PO, Take 1 tablet by mouth 3 (three) times daily., Disp: , Rfl:  .  mirtazapine (REMERON) 30 MG tablet, Take 30 mg by mouth at bedtime., Disp: , Rfl:  .  Multiple Vitamins-Minerals (MULTIVITAMIN WITH MINERALS) tablet, Take 1 tablet by mouth daily.  , Disp: , Rfl:    Review of Systems  Per HPI unless specifically indicated above     Objective:    BP 100/64   Pulse 78   Temp 97.9 F (36.6 C)   SpO2 98%   Wt Readings from Last 3 Encounters:  07/13/11 142 lb (64.4 kg)    Physical Exam Vitals signs reviewed.  Constitutional:      Appearance: She is well-developed.  HENT:     Head: Normocephalic and atraumatic.  Eyes:   Conjunctiva/sclera: Conjunctivae normal.     Pupils: Pupils are equal, round, and reactive to light.  Neck:     Musculoskeletal: Neck supple.     Thyroid: No thyromegaly.  Cardiovascular:     Rate and Rhythm: Normal rate and regular rhythm.  Pulmonary:     Effort: Pulmonary effort is normal.     Breath sounds: Normal breath sounds.  Abdominal:     General: Bowel sounds are normal.     Palpations: Abdomen is soft. There is no mass.     Tenderness: There is no abdominal tenderness.  Lymphadenopathy:     Cervical: No cervical adenopathy.  Skin:    General: Skin is warm and dry.  Neurological:     Mental Status: She is alert and oriented to person, place, and time.     Gait: Gait normal.  Psychiatric:        Behavior: Behavior normal.         Assessment & Plan:   Encounter Diagnoses  Name Primary?  . Encounter to establish care Yes  . Mood disorder (HCC)   . Substance abuse in remission (HCC)   . Alcohol abuse, in remission   . Tobacco  use disorder   . History of colon polyps      -pt will be put on mammogram list (to be scheduled when that resumes) -will defer routine labs at this time due to CV19 and old labs in EHR indicate no problems -pt is to review her records and contact us back so records of her previous colonoscopy can be obtained.  She is likely due for a repeat and will need to be referred to GI for this -pt to continue with [name] for MH issues -pt will be scheduled to [facility]TO in 1 year.  She is to contact office sooner for any problems.

## 2018-12-09 DIAGNOSIS — Z86010 Personal history of colonic polyps: Secondary | ICD-10-CM | POA: Insufficient documentation

## 2018-12-09 DIAGNOSIS — F1911 Other psychoactive substance abuse, in remission: Secondary | ICD-10-CM | POA: Insufficient documentation

## 2019-01-24 ENCOUNTER — Emergency Department (HOSPITAL_COMMUNITY)
Admission: EM | Admit: 2019-01-24 | Discharge: 2019-01-24 | Disposition: A | Discharge status: Home / Self Care | Payer: Self-pay | Attending: Emergency Medicine | Admitting: Emergency Medicine

## 2019-01-24 ENCOUNTER — Encounter (HOSPITAL_COMMUNITY): Payer: Self-pay | Admitting: Emergency Medicine

## 2019-01-24 ENCOUNTER — Other Ambulatory Visit: Payer: Self-pay

## 2019-01-24 DIAGNOSIS — R232 Flushing: Secondary | ICD-10-CM | POA: Insufficient documentation

## 2019-01-24 DIAGNOSIS — F17210 Nicotine dependence, cigarettes, uncomplicated: Secondary | ICD-10-CM | POA: Insufficient documentation

## 2019-01-24 DIAGNOSIS — Z79899 Other long term (current) drug therapy: Secondary | ICD-10-CM | POA: Insufficient documentation

## 2019-01-24 DIAGNOSIS — R110 Nausea: Secondary | ICD-10-CM

## 2019-01-24 DIAGNOSIS — F419 Anxiety disorder, unspecified: Secondary | ICD-10-CM | POA: Insufficient documentation

## 2019-01-24 LAB — CBC
HCT: 45.5 % (ref 36.0–46.0)
Hemoglobin: 14.8 g/dL (ref 12.0–15.0)
MCH: 29.8 pg (ref 26.0–34.0)
MCHC: 32.5 g/dL (ref 30.0–36.0)
MCV: 91.5 fL (ref 80.0–100.0)
Platelets: 308 10*3/uL (ref 150–400)
RBC: 4.97 MIL/uL (ref 3.87–5.11)
RDW: 12.3 % (ref 11.5–15.5)
WBC: 7.2 10*3/uL (ref 4.0–10.5)
nRBC: 0 % (ref 0.0–0.2)

## 2019-01-24 LAB — COMPREHENSIVE METABOLIC PANEL
ALT: 17 U/L (ref 0–44)
AST: 15 U/L (ref 15–41)
Albumin: 4.3 g/dL (ref 3.5–5.0)
Alkaline Phosphatase: 90 U/L (ref 38–126)
Anion gap: 7 (ref 5–15)
BUN: 7 mg/dL (ref 6–20)
CO2: 26 mmol/L (ref 22–32)
Calcium: 9.6 mg/dL (ref 8.9–10.3)
Chloride: 106 mmol/L (ref 98–111)
Creatinine, Ser: 0.68 mg/dL (ref 0.44–1.00)
GFR calc Af Amer: >60 mL/min (ref >60)
GFR calc non Af Amer: >60 mL/min (ref >60)
Glucose, Bld: 84 mg/dL (ref 70–99)
Potassium: 4.2 mmol/L (ref 3.5–5.1)
Sodium: 139 mmol/L (ref 135–145)
Total Bilirubin: 0.5 mg/dL (ref 0.3–1.2)
Total Protein: 7.2 g/dL (ref 6.5–8.1)

## 2019-01-24 LAB — LIPASE, BLOOD: Lipase: 30 U/L (ref 11–51)

## 2019-01-24 LAB — URINALYSIS, ROUTINE W REFLEX MICROSCOPIC
Bilirubin Urine: NEGATIVE
Glucose, UA: NEGATIVE mg/dL
Hgb urine dipstick: NEGATIVE
Ketones, ur: NEGATIVE mg/dL
Leukocytes,Ua: NEGATIVE
Nitrite: NEGATIVE
Protein, ur: NEGATIVE mg/dL
Specific Gravity, Urine: 1.01 (ref 1.005–1.030)
pH: 6 (ref 5.0–8.0)

## 2019-01-24 LAB — I-STAT BETA HCG BLOOD, ED (MC, WL, AP ONLY): I-stat hCG, quantitative: <5 m[IU]/mL (ref <5)

## 2019-01-24 LAB — TSH: TSH: 2.131 u[IU]/mL (ref 0.350–4.500)

## 2019-01-24 MED ORDER — SODIUM CHLORIDE 0.9% FLUSH: 3.0000 mL | Freq: Once | INTRAVENOUS | Status: DC

## 2019-01-24 MED ORDER — ARIPIPRAZOLE 10 MG PO TABS: 10.0000 mg | ORAL_TABLET | Freq: Every day | ORAL | 0 refills | Status: DC

## 2019-01-24 MED ORDER — ONDANSETRON 4 MG PO TBDP
4.0000 mg | ORAL_TABLET | Freq: Once | ORAL | Status: AC | PRN
  Administered 2019-01-24: 4 mg via ORAL
  Filled 2019-01-24: qty 1

## 2019-01-24 MED ORDER — ONDANSETRON 4 MG PO TBDP
8.0000 mg | ORAL_TABLET | Freq: Once | ORAL | Status: AC
  Administered 2019-01-24: 8 mg via ORAL
  Filled 2019-01-24: qty 2

## 2019-01-24 MED ORDER — ONDANSETRON HCL 4 MG PO TABS: 4.0000 mg | ORAL_TABLET | Freq: Four times a day (QID) | ORAL | 0 refills | Status: DC

## 2019-01-24 NOTE — ED Notes (Signed)
Pt c/o being miserable and states that she has been having hot flashes and nausea. States she has been 30 days clean from alcohol and drugs but thinks this has to do with menopause. LMP was one year ago.

## 2019-01-24 NOTE — ED Triage Notes (Addendum)
Onset a weeks ago developed per patient menopause symptoms with hot flashes and nausea. States feels some anxiety due to recovering from alcohol and drugs for 30 days. Also has a concerned of a sore on her tongue for 2 weeks. Airway intact bilateral equal chest rise and fall.

## 2019-01-24 NOTE — Discharge Instructions (Signed)
[facility] You can see the results of your thyroid test on MyChart Please follow up with a psychiatrist Return if worsening

## 2019-01-24 NOTE — ED Provider Notes (Signed)
[facility]   CSN: [id number] Arrival date & time: 01/24/19  1514     History   Chief Complaint Chief Complaint  Patient presents with  . Menopause  . Nausea  . Anxiety    HPI  Kristina Owens is a 53 y.o. female who presents with hot flashes, nausea, and anxiety.  Past medical history significant for anxiety, depression, GERD, history of polysubstance abuse.  Patient states that over the past several weeks she has been having frequent hot flashes.  She also reports increasing anxiety, obsessive compulsive thoughts, and nausea.  She went to rehab and has been clean for the past month.  She thinks any this may be contributing to her problem as well.  Her psychiatrist took her off Abilify she states that she has been on this medication for 7 years and felt like it previously helped her.  Now she has trouble with racing thoughts, obsessions about her health, insomnia.  She is not from the area and just moved here.  She states that she does not have an OB/GYN or psychiatrist here yet.  She also is requesting that I look at her tongue today because there is been a sore area on the right side of her tongue for 2 weeks.  She thinks it may be from biting down on her tongue however she is also been googling her symptoms and is worried about tongue cancer.  She denies fever, chest pain, shortness of breath, abdominal pain, vomiting, diarrhea, urinary symptoms.  No suicidal or homicidal ideation    HPI  Past Medical History:  Diagnosis Date  . Anxiety   . Depression   . GERD (gastroesophageal reflux disease)     Patient Active Problem List   Diagnosis Date Noted  . History of colon polyps 12/09/2018  . 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 APPENDECTOMY  age 53  . TONSILLECTOMY Bilateral age 53     OB History   No obstetric history on file.      Home Medications     Prior to Admission medications   Medication Sig Start Date End Date Taking? Authorizing Provider  ARIPiprazole (ABILIFY) 10 MG tablet Take 10 mg by mouth daily.      [provider]  escitalopram (LEXAPRO) 20 MG tablet Take 20 mg by mouth daily. Pt takes 1.5 tabs for 30mg  dose     [provider]  GABAPENTIN PO Take 1 tablet by mouth 3 (three) times daily.    [provider]  mirtazapine (REMERON) 30 MG tablet Take 30 mg by mouth at bedtime.    [provider]  Multiple Vitamins-Minerals (MULTIVITAMIN WITH MINERALS) tablet Take 1 tablet by mouth daily.      [provider]    Family History Family History  Problem Relation Age of Onset  . Asthma Mother   . COPD Mother   . Cancer Father        thyroid cancer    Social History Social History   Tobacco Use  . Smoking status: Current Every Day Smoker    Packs/day: 1.00    Years: 35.00    Pack years: 35.00    Types: Cigarettes  . Smokeless tobacco: Never Used  Substance Use Topics  . Alcohol use: Not Currently    Comment: alcoholic.  none since 11/15/18  . Drug use: Not Currently    Types: "Crack" cocaine  Comment: last use was 11-15-18     Allergies   Patient has no known allergies.   Review of Systems Review of Systems  Constitutional: Negative for fever.  Respiratory: Negative for shortness of breath.   Cardiovascular: Negative for chest pain.  Gastrointestinal: Positive for nausea. Negative for abdominal pain, diarrhea and vomiting.  Endocrine: Positive for heat intolerance.  Neurological: Negative for headaches.  Psychiatric/Behavioral: Positive for sleep disturbance. The patient is nervous/anxious.   All other systems reviewed and are negative.    Physical Exam Updated Vital Signs BP (!) 145/82 (BP Location: Right Arm)   Pulse 76   Temp 97.6 F (36.4 C) (Oral)   Resp 16   Ht 5\' 3"  (1.6 m)   Wt 65 kg   LMP 01/23/2018   SpO2 97%   BMI 25.38 kg/m    Physical Exam Vitals signs and nursing note reviewed.  Constitutional:      General: She is not in acute distress.    Appearance: Normal appearance. She is well-developed. She is not ill-appearing.     Comments: Calm, cooperative.  HENT:     Head: Normocephalic and atraumatic.     Comments: No evidence of tongue lesion. There is mild tenderness over the right side of the tongue Eyes:     General: No scleral icterus.       Right eye: No discharge.        Left eye: No discharge.     Conjunctiva/sclera: Conjunctivae normal.     Pupils: Pupils are equal, round, and reactive to light.  Neck:     Musculoskeletal: Normal range of motion.  Cardiovascular:     Rate and Rhythm: Normal rate and regular rhythm.  Pulmonary:     Effort: Pulmonary effort is normal. No respiratory distress.     Breath sounds: Normal breath sounds.  Abdominal:     General: There is no distension.     Palpations: Abdomen is soft.     Tenderness: There is no abdominal tenderness.  Skin:    General: Skin is warm and dry.  Neurological:     Mental Status: She is alert and oriented to person, place, and time.  Psychiatric:        Mood and Affect: Mood normal.        Behavior: Behavior normal.      ED Treatments / Results  Labs (all labs ordered are listed, but only abnormal results are displayed) Labs Reviewed  URINALYSIS, ROUTINE W REFLEX MICROSCOPIC - Abnormal; Notable for the following components:      Result Value   APPearance HAZY (*)    All other components within normal limits  LIPASE, BLOOD  COMPREHENSIVE METABOLIC PANEL  CBC  TSH  I-STAT BETA HCG BLOOD, ED ([facility], [facility], [facility] ONLY)    EKG    Radiology No results found.  Procedures Procedures (including critical care time)  Medications Ordered in ED Medications  sodium chloride flush (NS) 0.9 % injection 3 mL (has no administration in time range)  ondansetron (ZOFRAN-ODT) disintegrating tablet 4 mg (4 mg Oral Given 01/24/19 1614)   ondansetron (ZOFRAN-ODT) disintegrating tablet 8 mg (8 mg Oral Given 01/24/19 2225)     Initial Impression / Assessment and Plan / ED Course  I have reviewed the triage vital signs and the nursing notes.  Pertinent labs & imaging results that were available during my care of the patient were reviewed by me and considered in my medical decision making (see chart for details).  53 year old female presents with hot flashes, anxiety, nausea.  Her vital signs are normal here.  She is well-appearing on exam and exam is unremarkable.  I advised her that we do not give treatment for hot flashes here in the ED.  She was given a referral to women's clinic for this.  She is requesting a refill of her Abilify.  She states that this she was on this medicine long-term and was taken off of it cold [place] and she feels like her mental health is been suffering because of this.  She does not have any SI, HI, AVH.  We will give her a month supply and have her follow-up with a psychiatrist which it sounds like she is very eager to do.  Regarding her tongue do not see any evidence of any oral lesion.  She thinks she may be biting her tongue repeatedly but is worried about tongue cancer.  She was provided reassurance regarding this.  Her labs and urine are normal.  She is not pregnant.  Patient was advised to return as needed.  Final Clinical Impressions(s) / ED Diagnoses   Final diagnoses:  Anxiety  Nausea  Hot flashes    ED Discharge Orders    None       Recardo Evangelist, PA-C 01/24/19 2324    Sherwood Gambler, MD 01/24/19 2342

## 2019-05-27 ENCOUNTER — Emergency Department (HOSPITAL_COMMUNITY)
Admission: EM | Admit: 2019-05-27 | Discharge: 2019-05-27 | Disposition: A | Discharge status: Home / Self Care | Payer: Self-pay | Attending: Emergency Medicine | Admitting: Emergency Medicine

## 2019-05-27 ENCOUNTER — Encounter (HOSPITAL_COMMUNITY): Payer: Self-pay | Admitting: Emergency Medicine

## 2019-05-27 ENCOUNTER — Emergency Department (HOSPITAL_COMMUNITY): Payer: Self-pay

## 2019-05-27 ENCOUNTER — Other Ambulatory Visit: Payer: Self-pay

## 2019-05-27 DIAGNOSIS — R1030 Lower abdominal pain, unspecified: Secondary | ICD-10-CM | POA: Insufficient documentation

## 2019-05-27 DIAGNOSIS — Z79899 Other long term (current) drug therapy: Secondary | ICD-10-CM | POA: Insufficient documentation

## 2019-05-27 DIAGNOSIS — F1410 Cocaine abuse, uncomplicated: Secondary | ICD-10-CM | POA: Insufficient documentation

## 2019-05-27 DIAGNOSIS — R1031 Right lower quadrant pain: Secondary | ICD-10-CM | POA: Insufficient documentation

## 2019-05-27 DIAGNOSIS — F17210 Nicotine dependence, cigarettes, uncomplicated: Secondary | ICD-10-CM | POA: Insufficient documentation

## 2019-05-27 HISTORY — DX: Unspecified ovarian cyst, unspecified side: N83.209

## 2019-05-27 LAB — CBC WITH DIFFERENTIAL/PLATELET
Abs Immature Granulocytes: 0.03 10*3/uL (ref 0.00–0.07)
Basophils Absolute: 0 10*3/uL (ref 0.0–0.1)
Basophils Relative: 1 %
Eosinophils Absolute: 0.1 10*3/uL (ref 0.0–0.5)
Eosinophils Relative: 2 %
HCT: 45.1 % (ref 36.0–46.0)
Hemoglobin: 14.3 g/dL (ref 12.0–15.0)
Immature Granulocytes: 1 %
Lymphocytes Relative: 41 %
Lymphs Abs: 2.5 10*3/uL (ref 0.7–4.0)
MCH: 28.8 pg (ref 26.0–34.0)
MCHC: 31.7 g/dL (ref 30.0–36.0)
MCV: 90.7 fL (ref 80.0–100.0)
Monocytes Absolute: 0.5 10*3/uL (ref 0.1–1.0)
Monocytes Relative: 8 %
Neutro Abs: 3 10*3/uL (ref 1.7–7.7)
Neutrophils Relative %: 47 %
Platelets: 283 10*3/uL (ref 150–400)
RBC: 4.97 MIL/uL (ref 3.87–5.11)
RDW: 12.6 % (ref 11.5–15.5)
WBC: 6.2 10*3/uL (ref 4.0–10.5)
nRBC: 0 % (ref 0.0–0.2)

## 2019-05-27 LAB — URINALYSIS, ROUTINE W REFLEX MICROSCOPIC
Bilirubin Urine: NEGATIVE
Glucose, UA: NEGATIVE mg/dL
Hgb urine dipstick: NEGATIVE
Ketones, ur: NEGATIVE mg/dL
Leukocytes,Ua: NEGATIVE
Nitrite: NEGATIVE
Protein, ur: NEGATIVE mg/dL
Specific Gravity, Urine: 1.006 (ref 1.005–1.030)
pH: 5 (ref 5.0–8.0)

## 2019-05-27 LAB — COMPREHENSIVE METABOLIC PANEL
ALT: 17 U/L (ref 0–44)
AST: 16 U/L (ref 15–41)
Albumin: 4.1 g/dL (ref 3.5–5.0)
Alkaline Phosphatase: 85 U/L (ref 38–126)
Anion gap: 8 (ref 5–15)
BUN: 8 mg/dL (ref 6–20)
CO2: 24 mmol/L (ref 22–32)
Calcium: 8.8 mg/dL — ABNORMAL LOW (ref 8.9–10.3)
Chloride: 106 mmol/L (ref 98–111)
Creatinine, Ser: 0.61 mg/dL (ref 0.44–1.00)
GFR calc Af Amer: >60 mL/min (ref >60)
GFR calc non Af Amer: >60 mL/min (ref >60)
Glucose, Bld: 73 mg/dL (ref 70–99)
Potassium: 4.5 mmol/L (ref 3.5–5.1)
Sodium: 138 mmol/L (ref 135–145)
Total Bilirubin: 0.9 mg/dL (ref 0.3–1.2)
Total Protein: 7.1 g/dL (ref 6.5–8.1)

## 2019-05-27 LAB — PREGNANCY, URINE: Preg Test, Ur: NEGATIVE

## 2019-05-27 MED ORDER — KETOROLAC TROMETHAMINE 30 MG/ML IJ SOLN
30.0000 mg | Freq: Once | INTRAMUSCULAR | Status: AC
  Administered 2019-05-27: 30 mg via INTRAVENOUS
  Filled 2019-05-27: qty 1

## 2019-05-27 MED ORDER — ONDANSETRON HCL 4 MG/2ML IJ SOLN
4.0000 mg | Freq: Once | INTRAMUSCULAR | Status: AC
  Administered 2019-05-27: 4 mg via INTRAVENOUS
  Filled 2019-05-27: qty 2

## 2019-05-27 MED ORDER — FENTANYL CITRATE (PF) 100 MCG/2ML IJ SOLN
50.0000 ug | Freq: Once | INTRAMUSCULAR | Status: AC | PRN
  Administered 2019-05-27: 50 ug via INTRAVENOUS
  Filled 2019-05-27: qty 2

## 2019-05-27 MED ORDER — SODIUM CHLORIDE 0.9 % IV BOLUS
1000.0000 mL | Freq: Once | INTRAVENOUS | Status: AC
  Administered 2019-05-27: 1000 mL via INTRAVENOUS

## 2019-05-27 NOTE — ED Provider Notes (Signed)
[facility] DEPT Provider Note   CSN: [id number] Arrival date & time: 05/27/19  0841     History   Chief Complaint Chief Complaint  Patient presents with   Abdominal Pain    HPI Kristina Owens is a 53 y.o. female.     The history is provided by the patient and medical records. No language interpreter was used.  Abdominal Pain Associated symptoms: nausea and vomiting ("dry heaving" )   Associated symptoms: no constipation and no diarrhea    Kristina Owens is a 54 y.o. female  with a PMH of known right ovarian cyst, prior appendectomy who presents to the Emergency Department complaining of right lower quadrant pain. This has been present off and on for several weeks to months, however over the last week and a half, symptoms have become more severe.  At onset, she was told this was due to her ovarian cyst on the right, however with her worsening of pain as well as new nausea and dry heaving lately, presents to emergency department for concern of her worsening symptoms.  Denies any fever or chills.  No vaginal discharge or urinary symptoms.  No back pain.  No chest pain or shortness of breath.  Has tried ibuprofen this morning with some improvement.    Past Medical History:  Diagnosis Date   Anxiety    Depression    GERD (gastroesophageal reflux disease)    Ovarian cyst     Patient Active Problem List   Diagnosis Date Noted   History of colon polyps 12/09/2018   Substance abuse in remission ([place]) 12/09/2018    Past Surgical History:  Procedure Laterality Date   APPENDECTOMY  age 62   TONSILLECTOMY Bilateral age 31     OB History   No obstetric history on file.      Home Medications    Prior to Admission medications   Medication Sig Start Date End Date Taking? Authorizing Provider  ARIPiprazole (ABILIFY) 10 MG tablet Take 1 tablet (10 mg total) by mouth daily. 01/24/19  Yes Recardo Evangelist, PA-C  escitalopram  (LEXAPRO) 20 MG tablet Take 20 mg by mouth daily.    Yes [provider]  ibuprofen (ADVIL) 200 MG tablet Take 600 mg by mouth every 6 (six) hours as needed for fever or moderate pain.   Yes [provider]  Multiple Vitamins-Minerals (MULTIVITAMIN WITH MINERALS) tablet Take 1 tablet by mouth daily.      [provider]  ondansetron (ZOFRAN) 4 MG tablet Take 1 tablet (4 mg total) by mouth every 6 (six) hours. Patient not taking: Reported on 05/27/2019 01/24/19   Recardo Evangelist, PA-C    Family History Family History  Problem Relation Age of Onset   Asthma Mother    COPD Mother    Cancer Father        thyroid cancer    Social History Social History   Tobacco Use   Smoking status: Current Every Day Smoker    Packs/day: 1.00    Years: 35.00    Pack years: 35.00    Types: Cigarettes   Smokeless tobacco: Never Used  Substance Use Topics   Alcohol use: Not Currently    Comment: alcoholic.  none since 09/05/52   Drug use: Not Currently    Types: "Crack" cocaine    Comment: last use was 11-15-18     Allergies   Patient has no known allergies.   Review of Systems Review of Systems  Gastrointestinal: Positive for abdominal pain, nausea and vomiting ("dry heaving" ). Negative for constipation and diarrhea.     Physical Exam Updated Vital Signs BP 100/78 (BP Location: Right Arm)    Pulse 61    Temp 98.2 F (36.8 C) (Oral)    Resp 17    Ht 5\' 3"  (1.6 m)    Wt 65.8 kg    SpO2 99%    BMI 25.69 kg/m   Physical Exam Vitals signs and nursing note reviewed.  Constitutional:      General: She is not in acute distress.    Appearance: She is well-developed.  HENT:     Head: Normocephalic and atraumatic.  Neck:     Musculoskeletal: Neck supple.  Cardiovascular:     Rate and Rhythm: Normal rate and regular rhythm.     Heart sounds: Normal heart sounds. No murmur.  Pulmonary:     Effort: Pulmonary effort is normal. No respiratory distress.      Breath sounds: Normal breath sounds.  Abdominal:     General: There is no distension.     Palpations: Abdomen is soft.     Comments: Bilateral lower abdominal tenderness. No flank or CVA tenderness.  Skin:    General: Skin is warm and dry.  Neurological:     Mental Status: She is alert and oriented to person, place, and time.      ED Treatments / Results  Labs (all labs ordered are listed, but only abnormal results are displayed) Labs Reviewed  COMPREHENSIVE METABOLIC PANEL - Abnormal; Notable for the following components:      Result Value   Calcium 8.8 (*)    All other components within normal limits  URINALYSIS, ROUTINE W REFLEX MICROSCOPIC - Abnormal; Notable for the following components:   APPearance HAZY (*)    All other components within normal limits  CBC WITH DIFFERENTIAL/PLATELET  PREGNANCY, URINE    EKG None  Radiology [place] Transvaginal Non-ob  Result Date: 05/27/2019 CLINICAL DATA:  Right lower quadrant pain for 1 week. History of right ovarian cyst. EXAM: TRANSABDOMINAL AND TRANSVAGINAL ULTRASOUND OF PELVIS DOPPLER ULTRASOUND OF OVARIES TECHNIQUE: Both transabdominal and transvaginal ultrasound examinations of the pelvis were performed. Transabdominal technique was performed for global imaging of the pelvis including uterus, ovaries, adnexal regions, and pelvic cul-de-sac. It was necessary to proceed with endovaginal exam following the transabdominal exam to visualize the uterus, endometrium and ovaries/adnexa to better advantage. Color and duplex Doppler ultrasound was utilized to evaluate blood flow to the ovaries. COMPARISON:  None. FINDINGS: Uterus Measurements: 8.0 x 3.8 x 4.5 cm = volume: 72.8 mL. No fibroids or other mass visualized. Endometrium Thickness: 13 mm.  No focal abnormality visualized. Right ovary Measurements: 3.5 x 1.4 x 1.6 cm = volume: 4.2 mL. Normal appearance/no adnexal mass. Left ovary Measurements: 2.6 x 1.7 x 1.3 cm = volume: 3.1 mL. Normal  appearance/no adnexal mass. Pulsed Doppler evaluation of both ovaries demonstrates normal low-resistance arterial and venous waveforms. Other findings No abnormal free fluid. IMPRESSION: 1. Normal transabdominal and endovaginal pelvic ultrasound. No findings to account for the patient's right lower quadrant pain. Electronically Signed   By: Amie Portlandavid  Ormond M.D.   On: 05/27/2019 10:20   [place] Pelvis Complete  Result Date: 05/27/2019 CLINICAL DATA:  Right lower quadrant pain for 1 week. History of right ovarian cyst. EXAM: TRANSABDOMINAL AND TRANSVAGINAL ULTRASOUND OF PELVIS DOPPLER ULTRASOUND OF OVARIES TECHNIQUE: Both transabdominal and transvaginal ultrasound examinations of the pelvis were performed. Transabdominal technique was  performed for global imaging of the pelvis including uterus, ovaries, adnexal regions, and pelvic cul-de-sac. It was necessary to proceed with endovaginal exam following the transabdominal exam to visualize the uterus, endometrium and ovaries/adnexa to better advantage. Color and duplex Doppler ultrasound was utilized to evaluate blood flow to the ovaries. COMPARISON:  None. FINDINGS: Uterus Measurements: 8.0 x 3.8 x 4.5 cm = volume: 72.8 mL. No fibroids or other mass visualized. Endometrium Thickness: 13 mm.  No focal abnormality visualized. Right ovary Measurements: 3.5 x 1.4 x 1.6 cm = volume: 4.2 mL. Normal appearance/no adnexal mass. Left ovary Measurements: 2.6 x 1.7 x 1.3 cm = volume: 3.1 mL. Normal appearance/no adnexal mass. Pulsed Doppler evaluation of both ovaries demonstrates normal low-resistance arterial and venous waveforms. Other findings No abnormal free fluid. IMPRESSION: 1. Normal transabdominal and endovaginal pelvic ultrasound. No findings to account for the patient's right lower quadrant pain. Electronically Signed   By: Amie Portland M.D.   On: 05/27/2019 10:20   [place] Art/ven Flow Abd Pelv Doppler  Result Date: 05/27/2019 CLINICAL DATA:  Right lower quadrant pain  for 1 week. History of right ovarian cyst. EXAM: TRANSABDOMINAL AND TRANSVAGINAL ULTRASOUND OF PELVIS DOPPLER ULTRASOUND OF OVARIES TECHNIQUE: Both transabdominal and transvaginal ultrasound examinations of the pelvis were performed. Transabdominal technique was performed for global imaging of the pelvis including uterus, ovaries, adnexal regions, and pelvic cul-de-sac. It was necessary to proceed with endovaginal exam following the transabdominal exam to visualize the uterus, endometrium and ovaries/adnexa to better advantage. Color and duplex Doppler ultrasound was utilized to evaluate blood flow to the ovaries. COMPARISON:  None. FINDINGS: Uterus Measurements: 8.0 x 3.8 x 4.5 cm = volume: 72.8 mL. No fibroids or other mass visualized. Endometrium Thickness: 13 mm.  No focal abnormality visualized. Right ovary Measurements: 3.5 x 1.4 x 1.6 cm = volume: 4.2 mL. Normal appearance/no adnexal mass. Left ovary Measurements: 2.6 x 1.7 x 1.3 cm = volume: 3.1 mL. Normal appearance/no adnexal mass. Pulsed Doppler evaluation of both ovaries demonstrates normal low-resistance arterial and venous waveforms. Other findings No abnormal free fluid. IMPRESSION: 1. Normal transabdominal and endovaginal pelvic ultrasound. No findings to account for the patient's right lower quadrant pain. Electronically Signed   By: Amie Portland M.D.   On: 05/27/2019 10:20    Procedures Procedures (including critical care time)  Medications Ordered in ED Medications  sodium chloride 0.9 % bolus 1,000 mL (1,000 mLs Intravenous New Bag/Given 05/27/19 0934)  ketorolac (TORADOL) 30 MG/ML injection 30 mg (30 mg Intravenous Given 05/27/19 0936)  fentaNYL (SUBLIMAZE) injection 50 mcg (50 mcg Intravenous Given 05/27/19 0936)  ondansetron (ZOFRAN) injection 4 mg (4 mg Intravenous Given 05/27/19 0943)     Initial Impression / Assessment and Plan / ED Course  I have reviewed the triage vital signs and the nursing notes.  Pertinent labs &  imaging results that were available during my care of the patient were reviewed by me and considered in my medical decision making (see chart for details).       Arilla Massachusetts is a 53 y.o. female who presents to ED for right lower abdominal pain which feels similar to her previous ovarian cysts.  On exam, patient is afebrile, hemodynamically stable with mild lower abdominal tenderness.  She has had prior appendectomy therefore no concern for appendicitis with her right lower quadrant pain.  No flank or CVA tenderness and urinalysis without signs of infection.  Labs reviewed and reassuring.  Urine pregnancy negative.  Ultrasound without evidence of  torsion.  Offered pelvic exam for STD testing, however patient denies any vaginal discharge, denies being sexually active and does not feel as if this is necessary.  Nonsurgical abdominal exam on reeval. Evaluation does not show pathology that would require ongoing emergent intervention or inpatient treatment. Reasons to return to ER was discussed, PCP follow up encouraged and all questions answered.      MDM Number of Diagnoses or Management Options Lower abdominal pain:     Final Clinical Impressions(s) / ED Diagnoses   Final diagnoses:  Lower abdominal pain    ED Discharge Orders    None       Shanikka Wonders, Chase Picket, PA-C 05/27/19 1119    Linwood Dibbles, MD 05/29/19 1146

## 2019-05-27 NOTE — Discharge Instructions (Signed)
It was my pleasure taking care of you today!  ° °Follow up with your primary care doctor.  ° °Return to ER for new or worsening symptoms, any additional concerns.  °

## 2019-05-27 NOTE — ED Triage Notes (Signed)
Patient arrived by self from home. Pt c/o RT lower ABD pain.   Patient states she has had some nausea and dry heaves.   Patient rates pain 6/10.   Patient has hx of cyst on ovary.

## 2019-07-05 ENCOUNTER — Encounter (HOSPITAL_COMMUNITY): Payer: Self-pay

## 2019-07-05 ENCOUNTER — Emergency Department (HOSPITAL_COMMUNITY): Payer: Self-pay

## 2019-07-05 ENCOUNTER — Other Ambulatory Visit: Payer: Self-pay

## 2019-07-05 ENCOUNTER — Emergency Department (HOSPITAL_COMMUNITY)
Admission: EM | Admit: 2019-07-05 | Discharge: 2019-07-05 | Disposition: A | Discharge status: Home / Self Care | Payer: Self-pay | Attending: Emergency Medicine | Admitting: Emergency Medicine

## 2019-07-05 DIAGNOSIS — F17210 Nicotine dependence, cigarettes, uncomplicated: Secondary | ICD-10-CM | POA: Insufficient documentation

## 2019-07-05 DIAGNOSIS — R1031 Right lower quadrant pain: Secondary | ICD-10-CM | POA: Insufficient documentation

## 2019-07-05 DIAGNOSIS — R102 Pelvic and perineal pain: Secondary | ICD-10-CM | POA: Insufficient documentation

## 2019-07-05 DIAGNOSIS — R45851 Suicidal ideations: Secondary | ICD-10-CM | POA: Insufficient documentation

## 2019-07-05 DIAGNOSIS — Z113 Encounter for screening for infections with a predominantly sexual mode of transmission: Secondary | ICD-10-CM | POA: Insufficient documentation

## 2019-07-05 DIAGNOSIS — Z008 Encounter for other general examination: Secondary | ICD-10-CM | POA: Insufficient documentation

## 2019-07-05 DIAGNOSIS — Z79899 Other long term (current) drug therapy: Secondary | ICD-10-CM | POA: Insufficient documentation

## 2019-07-05 DIAGNOSIS — Z20828 Contact with and (suspected) exposure to other viral communicable diseases: Secondary | ICD-10-CM | POA: Insufficient documentation

## 2019-07-05 LAB — COMPREHENSIVE METABOLIC PANEL
ALT: 16 U/L (ref 0–44)
AST: 15 U/L (ref 15–41)
Albumin: 4.8 g/dL (ref 3.5–5.0)
Alkaline Phosphatase: 95 U/L (ref 38–126)
Anion gap: 9 (ref 5–15)
BUN: 8 mg/dL (ref 6–20)
CO2: 26 mmol/L (ref 22–32)
Calcium: 9 mg/dL (ref 8.9–10.3)
Chloride: 108 mmol/L (ref 98–111)
Creatinine, Ser: 0.52 mg/dL (ref 0.44–1.00)
GFR calc Af Amer: >60 mL/min (ref >60)
GFR calc non Af Amer: >60 mL/min (ref >60)
Glucose, Bld: 101 mg/dL — ABNORMAL HIGH (ref 70–99)
Potassium: 4 mmol/L (ref 3.5–5.1)
Sodium: 143 mmol/L (ref 135–145)
Total Bilirubin: 0.3 mg/dL (ref 0.3–1.2)
Total Protein: 8.2 g/dL — ABNORMAL HIGH (ref 6.5–8.1)

## 2019-07-05 LAB — URINALYSIS, ROUTINE W REFLEX MICROSCOPIC
Bilirubin Urine: NEGATIVE
Glucose, UA: NEGATIVE mg/dL
Ketones, ur: NEGATIVE mg/dL
Nitrite: NEGATIVE
Protein, ur: NEGATIVE mg/dL
Specific Gravity, Urine: 1.008 (ref 1.005–1.030)
pH: 5 (ref 5.0–8.0)

## 2019-07-05 LAB — LIPASE, BLOOD: Lipase: 41 U/L (ref 11–51)

## 2019-07-05 LAB — CBC WITH DIFFERENTIAL/PLATELET
Abs Immature Granulocytes: 0.06 10*3/uL (ref 0.00–0.07)
Basophils Absolute: 0 10*3/uL (ref 0.0–0.1)
Basophils Relative: 1 %
Eosinophils Absolute: 0.1 10*3/uL (ref 0.0–0.5)
Eosinophils Relative: 1 %
HCT: 48.2 % — ABNORMAL HIGH (ref 36.0–46.0)
Hemoglobin: 15.7 g/dL — ABNORMAL HIGH (ref 12.0–15.0)
Immature Granulocytes: 1 %
Lymphocytes Relative: 44 %
Lymphs Abs: 3.6 10*3/uL (ref 0.7–4.0)
MCH: 29.1 pg (ref 26.0–34.0)
MCHC: 32.6 g/dL (ref 30.0–36.0)
MCV: 89.3 fL (ref 80.0–100.0)
Monocytes Absolute: 0.5 10*3/uL (ref 0.1–1.0)
Monocytes Relative: 6 %
Neutro Abs: 4 10*3/uL (ref 1.7–7.7)
Neutrophils Relative %: 47 %
Platelets: 339 10*3/uL (ref 150–400)
RBC: 5.4 MIL/uL — ABNORMAL HIGH (ref 3.87–5.11)
RDW: 12.6 % (ref 11.5–15.5)
WBC: 8.2 10*3/uL (ref 4.0–10.5)
nRBC: 0 % (ref 0.0–0.2)

## 2019-07-05 LAB — RAPID URINE DRUG SCREEN, HOSP PERFORMED
Amphetamines: NOT DETECTED
Barbiturates: NOT DETECTED
Benzodiazepines: NOT DETECTED
Cocaine: NOT DETECTED
Opiates: NOT DETECTED
Tetrahydrocannabinol: NOT DETECTED

## 2019-07-05 LAB — I-STAT BETA HCG BLOOD, ED (MC, WL, AP ONLY): I-stat hCG, quantitative: <5 m[IU]/mL (ref <5)

## 2019-07-05 LAB — WET PREP, GENITAL
Sperm: NONE SEEN
Trich, Wet Prep: NONE SEEN
WBC, Wet Prep HPF POC: NONE SEEN
Yeast Wet Prep HPF POC: NONE SEEN

## 2019-07-05 LAB — ETHANOL: Alcohol, Ethyl (B): 185 mg/dL — ABNORMAL HIGH (ref <10)

## 2019-07-05 LAB — SARS CORONAVIRUS 2 (TAT 6-24 HRS): SARS Coronavirus 2: NEGATIVE

## 2019-07-05 MED ORDER — MORPHINE SULFATE (PF) 4 MG/ML IV SOLN
4.0000 mg | Freq: Once | INTRAVENOUS | Status: AC
  Administered 2019-07-05: 4 mg via INTRAVENOUS
  Filled 2019-07-05: qty 1

## 2019-07-05 MED ORDER — IOHEXOL 300 MG/ML  SOLN
100.0000 mL | Freq: Once | INTRAMUSCULAR | Status: AC | PRN
  Administered 2019-07-05: 100 mL via INTRAVENOUS

## 2019-07-05 MED ORDER — SODIUM CHLORIDE (PF) 0.9 % IJ SOLN
INTRAMUSCULAR | Status: AC
  Filled 2019-07-05: qty 50

## 2019-07-05 MED ORDER — ONDANSETRON HCL 4 MG/2ML IJ SOLN
4.0000 mg | Freq: Once | INTRAMUSCULAR | Status: AC
  Administered 2019-07-05: 4 mg via INTRAVENOUS
  Filled 2019-07-05: qty 2

## 2019-07-05 NOTE — BH Assessment (Signed)
[facility] Assessment Progress Note  Per Neita Garnet, MD, this pt does not require psychiatric hospitalization at this time.  Pt is to be discharged from [facility] with recommendation to continue treatment with her current outpatient provider.  This has been included in pt's discharge instructions.  Pt's nurse has been notified.  Jalene Mullet, [place] Triage Specialist [phone]

## 2019-07-05 NOTE — ED Notes (Signed)
Pt now denies suicidal ideations.

## 2019-07-05 NOTE — ED Triage Notes (Addendum)
Arrived POV from home. Dropped off by friend. Patient reports she is depressed and has thoughts of ending her life w/o specific plan. Patient cannot think of any recent events that had made her feel this way. Patient says she had a few shots of Vodka tonight

## 2019-07-05 NOTE — ED Provider Notes (Signed)
[facility]-EMERGENCY DEPT Provider Note   CSN: [id number] Arrival date & time: 07/05/19  0000     History   Chief Complaint Chief Complaint  Patient presents with  . Suicidal    HPI Kristina Owens is a 53 y.o. female.     Patient presents to the emergency department for evaluation of multiple problems.  Patient reports that Kristina Owens has been experiencing right lower abdominal and pelvic pain for several days.  Kristina Owens has had similar pains in the past and it was from an ovarian cyst.  Kristina Owens is perimenopausal, no vaginal bleeding.  Kristina Owens has not had any urinary symptoms.  No associated nausea, vomiting or diarrhea.  Patient also concerned about depression.  Kristina Owens told the triage nurse that Kristina Owens was depressed and having suicidal thoughts.  By the time Kristina Owens made it back to the exam room 2 hours later, however, patient now states that Kristina Owens is not suicidal.  Kristina Owens feels safe.  Kristina Owens still would be interested in talking to a counselor, however.     Past Medical History:  Diagnosis Date  . Anxiety   . Depression   . GERD (gastroesophageal reflux disease)   . Ovarian cyst     Patient Active Problem List   Diagnosis Date Noted  . History of colon polyps 12/09/2018  . 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 APPENDECTOMY  age 53  . TONSILLECTOMY Bilateral age 53     OB History   No obstetric history on file.      Home Medications    Prior to Admission medications   Medication Sig Start Date End Date Taking? Authorizing Provider  ARIPiprazole (ABILIFY) 10 MG tablet Take 1 tablet (10 mg total) by mouth daily. 01/24/19  Yes Bethel BornGekas, Kelly Marie, PA-C  escitalopram (LEXAPRO) 20 MG tablet Take 20 mg by mouth daily.    Yes [provider]  ondansetron (ZOFRAN) 4 MG tablet Take 1 tablet (4 mg total) by mouth every 6 (six) hours. Patient not taking: Reported on 05/27/2019 01/24/19   Bethel BornGekas, Kelly Marie, PA-C     Family History Family History  Problem Relation Age of Onset  . Asthma Mother   . COPD Mother   . Cancer Father        thyroid cancer    Social History Social History   Tobacco Use  . Smoking status: Current Every Day Smoker    Packs/day: 1.00    Years: 35.00    Pack years: 35.00    Types: Cigarettes  . Smokeless tobacco: Never Used  Substance Use Topics  . Alcohol use: Not Currently    Comment: alcoholic.  none since 11/15/18  . Drug use: Not Currently    Types: "Crack" cocaine    Comment: last use was 11-15-18     Allergies   Patient has no known allergies.   Review of Systems Review of Systems  Gastrointestinal: Positive for abdominal pain.  Genitourinary: Positive for pelvic pain.  Psychiatric/Behavioral: Positive for dysphoric mood.  All other systems reviewed and are negative.    Physical Exam Updated Vital Signs BP (!) 85/61   Pulse 70   Temp (!) 97.3 F (36.3 C) (Oral)   Resp 20   SpO2 96%   Physical Exam Vitals signs and nursing note reviewed. Exam conducted with a chaperone present.  Constitutional:      General: Kristina Owens is not in acute distress.    Appearance:  Normal appearance. Kristina Owens is well-developed.  HENT:     Head: Normocephalic and atraumatic.     Right Ear: Hearing normal.     Left Ear: Hearing normal.     Nose: Nose normal.  Eyes:     Conjunctiva/sclera: Conjunctivae normal.     Pupils: Pupils are equal, round, and reactive to light.  Neck:     Musculoskeletal: Normal range of motion and neck supple.  Cardiovascular:     Rate and Rhythm: Regular rhythm.     Heart sounds: S1 normal and S2 normal. No murmur. No friction rub. No gallop.   Pulmonary:     Effort: Pulmonary effort is normal. No respiratory distress.     Breath sounds: Normal breath sounds.  Chest:     Chest wall: No tenderness.  Abdominal:     General: Bowel sounds are normal.     Palpations: Abdomen is soft.     Tenderness: There is abdominal tenderness in the  right lower quadrant. There is no guarding or rebound. Negative signs include Murphy's sign and McBurney's sign.     Hernia: No hernia is present. There is no hernia in the left inguinal area or right inguinal area.  Genitourinary:    General: Normal vulva.     Vagina: Normal.     Cervix: Normal.     Uterus: Normal.      Adnexa: Right adnexa normal and left adnexa normal.  Musculoskeletal: Normal range of motion.  Lymphadenopathy:     Lower Body: No right inguinal adenopathy. No left inguinal adenopathy.  Skin:    General: Skin is warm and dry.     Findings: No rash.  Neurological:     Mental Status: Kristina Owens is alert and oriented to person, place, and time.     GCS: GCS eye subscore is 4. GCS verbal subscore is 5. GCS motor subscore is 6.     Cranial Nerves: No cranial nerve deficit.     Sensory: No sensory deficit.     Coordination: Coordination normal.  Psychiatric:        Speech: Speech normal.        Behavior: Behavior normal.        Thought Content: Thought content normal.      ED Treatments / Results  Labs (all labs ordered are listed, but only abnormal results are displayed) Labs Reviewed  WET PREP, GENITAL - Abnormal; Notable for the following components:      Result Value   Clue Cells Wet Prep HPF POC PRESENT (*)    All other components within normal limits  CBC WITH DIFFERENTIAL/PLATELET - Abnormal; Notable for the following components:   RBC 5.40 (*)    Hemoglobin 15.7 (*)    HCT 48.2 (*)    All other components within normal limits  COMPREHENSIVE METABOLIC PANEL - Abnormal; Notable for the following components:   Glucose, Bld 101 (*)    Total Protein 8.2 (*)    All other components within normal limits  URINALYSIS, ROUTINE W REFLEX MICROSCOPIC - Abnormal; Notable for the following components:   Color, Urine AMBER (*)    Hgb urine dipstick SMALL (*)    Leukocytes,Ua TRACE (*)    Bacteria, UA RARE (*)    All other components within normal limits  ETHANOL -  Abnormal; Notable for the following components:   Alcohol, Ethyl (B) 185 (*)    All other components within normal limits  LIPASE, BLOOD  RAPID URINE DRUG SCREEN, HOSP PERFORMED  I-STAT BETA HCG BLOOD, ED ([facility], [facility], [facility] ONLY)  GC/CHLAMYDIA PROBE AMP ([place]) NOT AT [facility]    EKG None  Radiology Ct Abdomen Pelvis W Contrast  Result Date: 07/05/2019 CLINICAL DATA:  Generalized abdominal pain. EXAM: CT ABDOMEN AND PELVIS WITH CONTRAST TECHNIQUE: Multidetector CT imaging of the abdomen and pelvis was performed using the standard protocol following bolus administration of intravenous contrast. CONTRAST:  OMNIPAQUE IOHEXOL 300 MG/ML  SOLN COMPARISON:  CT scan 04/10/2011 FINDINGS: Lower chest: The lung bases are clear of acute process. No pleural effusion or pulmonary lesions. The heart is normal in size. No pericardial effusion. The distal esophagus and aorta are unremarkable. Hepatobiliary: No focal hepatic lesions or intrahepatic biliary dilatation. Gallbladder is normal. No common bile duct dilatation. The portal and hepatic veins are patent. Pancreas: This no mass, inflammation or ductal dilatation. Spleen: Normal size.  No focal lesions. Adrenals/Urinary Tract: The adrenal glands are normal. No renal, ureteral or bladder calculi or mass. No findings for pyelonephritis. Stomach/Bowel: The stomach, duodenum, small bowel and colon are unremarkable. No acute inflammatory changes, mass lesions or obstructive findings. The terminal ileum is normal. The appendix is surgically absent. Vascular/Lymphatic: The aorta is normal in caliber. No dissection. The branch vessels are patent. The major venous structures are patent. No mesenteric or retroperitoneal mass or adenopathy. Small scattered lymph nodes are noted. Reproductive: The uterus and ovaries are unremarkable. Other: No pelvic mass or adenopathy. No free pelvic fluid collections. No inguinal mass or adenopathy. No abdominal wall hernia or  subcutaneous lesions. Evidence of prior right-sided abdominal wall hernia repair. Musculoskeletal: No significant bony findings. IMPRESSION: Unremarkable abdominal/pelvic CT scan. No acute abdominal/pelvic findings, mass lesions or adenopathy. Electronically Signed   By: Rudie Meyer M.D.   On: 07/05/2019 05:14    Procedures Procedures (including critical care time)  Medications Ordered in ED Medications  sodium chloride (PF) 0.9 % injection (has no administration in time range)  morphine 4 MG/ML injection 4 mg (4 mg Intravenous Given 07/05/19 0317)  ondansetron (ZOFRAN) injection 4 mg (4 mg Intravenous Given 07/05/19 0317)  iohexol (OMNIPAQUE) 300 MG/ML solution 100 mL (100 mLs Intravenous Contrast Given 07/05/19 0441)     Initial Impression / Assessment and Plan / ED Course  I have reviewed the triage vital signs and the nursing notes.  Pertinent labs & imaging results that were available during my care of the patient were reviewed by me and considered in my medical decision making (see chart for details).        Patient presents to the emergency department with multiple complaints.  Patient initially presented with concerns over depression and suicidal thoughts.  When I encountered her in the exam room, however, Kristina Owens did not even mention her depression, began to complain about right-sided abdominal and pelvic pain.  Kristina Owens reports that this feels similar to ovarian cysts that Kristina Owens has had in the past.  Pelvic exam was unremarkable.  Kristina Owens has had a previous appendectomy.  CT scan performed to further evaluate, anticipating possible psychiatric evaluation.  CT scan was negative.  Patient keeps asking to leave, but is redirectable and once questions, agrees to stay for psychiatric evaluation.  Kristina Owens is medically clear for psychiatric evaluation and/or treatment.  Final Clinical Impressions(s) / ED Diagnoses   Final diagnoses:  Suicidal ideation  Pelvic pain    ED Discharge Orders    None        Gilda Crease, MD 07/05/19 [phone]

## 2019-07-05 NOTE — ED Notes (Signed)
Pt is on the phone with her step father asking him to come get her. Informed the pt we would do everything we could for in the quickest time possible. She said okay.

## 2019-07-05 NOTE — ED Notes (Signed)
Sitter in room with pt. 

## 2019-07-05 NOTE — Discharge Instructions (Signed)
For your behavioral health needs, you are advised to follow up with your current outpatient providers. °

## 2019-07-05 NOTE — ED Notes (Addendum)
Pt during triage called her step father to come pick her up. When asked if she wanted to be seen for mental health she said yes and that she would stay

## 2019-07-05 NOTE — ED Notes (Signed)
Pt has requested pain meds. Informed Pt she would have to wait to see the MD before getting anything for pain.

## 2019-07-05 NOTE — ED Notes (Addendum)
Pt requested to see the MD. Informed her they are busy and they would see her as soon as they could

## 2019-07-06 LAB — GC/CHLAMYDIA PROBE AMP (~~LOC~~) NOT AT ARMC
Chlamydia: NEGATIVE
Neisseria Gonorrhea: NEGATIVE

## 2019-07-23 ENCOUNTER — Emergency Department (HOSPITAL_COMMUNITY)
Admission: EM | Admit: 2019-07-23 | Discharge: 2019-07-25 | Payer: Medicaid Other | Attending: Emergency Medicine | Admitting: Emergency Medicine

## 2019-07-23 ENCOUNTER — Other Ambulatory Visit: Payer: Self-pay

## 2019-07-23 ENCOUNTER — Encounter (HOSPITAL_COMMUNITY): Payer: Self-pay

## 2019-07-23 DIAGNOSIS — U071 COVID-19: Secondary | ICD-10-CM

## 2019-07-23 DIAGNOSIS — Z79899 Other long term (current) drug therapy: Secondary | ICD-10-CM | POA: Insufficient documentation

## 2019-07-23 DIAGNOSIS — F10929 Alcohol use, unspecified with intoxication, unspecified: Secondary | ICD-10-CM | POA: Insufficient documentation

## 2019-07-23 DIAGNOSIS — Z9114 Patient's other noncompliance with medication regimen: Secondary | ICD-10-CM | POA: Insufficient documentation

## 2019-07-23 DIAGNOSIS — R45851 Suicidal ideations: Secondary | ICD-10-CM | POA: Insufficient documentation

## 2019-07-23 DIAGNOSIS — F1094 Alcohol use, unspecified with alcohol-induced mood disorder: Secondary | ICD-10-CM

## 2019-07-23 DIAGNOSIS — Y907 Blood alcohol level of 200-239 mg/100 ml: Secondary | ICD-10-CM | POA: Insufficient documentation

## 2019-07-23 DIAGNOSIS — F331 Major depressive disorder, recurrent, moderate: Secondary | ICD-10-CM | POA: Insufficient documentation

## 2019-07-23 DIAGNOSIS — F1420 Cocaine dependence, uncomplicated: Secondary | ICD-10-CM

## 2019-07-23 LAB — COMPREHENSIVE METABOLIC PANEL
ALT: 46 U/L — ABNORMAL HIGH (ref 0–44)
AST: 49 U/L — ABNORMAL HIGH (ref 15–41)
Albumin: 3.9 g/dL (ref 3.5–5.0)
Alkaline Phosphatase: 106 U/L (ref 38–126)
Anion gap: 11 (ref 5–15)
BUN: 17 mg/dL (ref 6–20)
CO2: 21 mmol/L — ABNORMAL LOW (ref 22–32)
Calcium: 8.4 mg/dL — ABNORMAL LOW (ref 8.9–10.3)
Chloride: 107 mmol/L (ref 98–111)
Creatinine, Ser: 0.59 mg/dL (ref 0.44–1.00)
GFR calc Af Amer: >60 mL/min (ref >60)
GFR calc non Af Amer: >60 mL/min (ref >60)
Glucose, Bld: 120 mg/dL — ABNORMAL HIGH (ref 70–99)
Potassium: 3.9 mmol/L (ref 3.5–5.1)
Sodium: 139 mmol/L (ref 135–145)
Total Bilirubin: 0.5 mg/dL (ref 0.3–1.2)
Total Protein: 7.1 g/dL (ref 6.5–8.1)

## 2019-07-23 LAB — CBC WITH DIFFERENTIAL/PLATELET
Abs Immature Granulocytes: 0.07 10*3/uL (ref 0.00–0.07)
Basophils Absolute: 0 10*3/uL (ref 0.0–0.1)
Basophils Relative: 0 %
Eosinophils Absolute: 0.1 10*3/uL (ref 0.0–0.5)
Eosinophils Relative: 1 %
HCT: 43.9 % (ref 36.0–46.0)
Hemoglobin: 14.9 g/dL (ref 12.0–15.0)
Immature Granulocytes: 1 %
Lymphocytes Relative: 41 %
Lymphs Abs: 4.1 10*3/uL — ABNORMAL HIGH (ref 0.7–4.0)
MCH: 29.9 pg (ref 26.0–34.0)
MCHC: 33.9 g/dL (ref 30.0–36.0)
MCV: 88.2 fL (ref 80.0–100.0)
Monocytes Absolute: 0.7 10*3/uL (ref 0.1–1.0)
Monocytes Relative: 7 %
Neutro Abs: 5.1 10*3/uL (ref 1.7–7.7)
Neutrophils Relative %: 50 %
Platelets: 253 10*3/uL (ref 150–400)
RBC: 4.98 MIL/uL (ref 3.87–5.11)
RDW: 13.7 % (ref 11.5–15.5)
WBC: 10.2 10*3/uL (ref 4.0–10.5)
nRBC: 0 % (ref 0.0–0.2)

## 2019-07-23 LAB — RESPIRATORY PANEL BY RT PCR (FLU A&B, COVID)
Influenza A by PCR: NEGATIVE
Influenza B by PCR: NEGATIVE
SARS Coronavirus 2 by RT PCR: POSITIVE — AB

## 2019-07-23 LAB — ACETAMINOPHEN LEVEL: Acetaminophen (Tylenol), Serum: <10 ug/mL — ABNORMAL LOW (ref 10–30)

## 2019-07-23 LAB — LIPASE, BLOOD: Lipase: 33 U/L (ref 11–51)

## 2019-07-23 LAB — ETHANOL: Alcohol, Ethyl (B): 230 mg/dL — ABNORMAL HIGH (ref <10)

## 2019-07-23 LAB — SALICYLATE LEVEL: Salicylate Lvl: <7 mg/dL (ref 2.8–30.0)

## 2019-07-23 MED ORDER — LORAZEPAM 2 MG/ML IJ SOLN: 0.0000 mg | Freq: Four times a day (QID) | INTRAMUSCULAR | Status: DC

## 2019-07-23 MED ORDER — ESCITALOPRAM OXALATE 10 MG PO TABS
20.0000 mg | ORAL_TABLET | Freq: Every day | ORAL | Status: DC
  Administered 2019-07-23 – 2019-07-25 (×3): 20 mg via ORAL
  Filled 2019-07-23 (×3): qty 2

## 2019-07-23 MED ORDER — LORAZEPAM 1 MG PO TABS: 0.0000 mg | ORAL_TABLET | Freq: Two times a day (BID) | ORAL | Status: DC

## 2019-07-23 MED ORDER — LORAZEPAM 2 MG/ML IJ SOLN: 0.0000 mg | Freq: Two times a day (BID) | INTRAMUSCULAR | Status: DC

## 2019-07-23 MED ORDER — THIAMINE HCL 100 MG/ML IJ SOLN: 100.0000 mg | Freq: Every day | INTRAMUSCULAR | Status: DC

## 2019-07-23 MED ORDER — LORAZEPAM 1 MG PO TABS
0.0000 mg | ORAL_TABLET | Freq: Four times a day (QID) | ORAL | Status: DC
  Administered 2019-07-24: 1 mg via ORAL
  Administered 2019-07-24 – 2019-07-25 (×3): 2 mg via ORAL
  Filled 2019-07-23: qty 2
  Filled 2019-07-23: qty 1
  Filled 2019-07-23 (×2): qty 2

## 2019-07-23 MED ORDER — NICOTINE 21 MG/24HR TD PT24
21.0000 mg | MEDICATED_PATCH | Freq: Every day | TRANSDERMAL | Status: DC
  Administered 2019-07-23 – 2019-07-25 (×2): 21 mg via TRANSDERMAL
  Filled 2019-07-23 (×2): qty 1

## 2019-07-23 MED ORDER — ARIPIPRAZOLE 10 MG PO TABS
10.0000 mg | ORAL_TABLET | Freq: Every day | ORAL | Status: DC
  Administered 2019-07-23 – 2019-07-25 (×3): 10 mg via ORAL
  Filled 2019-07-23 (×3): qty 1

## 2019-07-23 MED ORDER — ONDANSETRON 4 MG PO TBDP
4.0000 mg | ORAL_TABLET | Freq: Once | ORAL | Status: AC
  Administered 2019-07-23: 4 mg via ORAL
  Filled 2019-07-23: qty 1

## 2019-07-23 MED ORDER — HYDROXYZINE HCL 25 MG PO TABS
50.0000 mg | ORAL_TABLET | Freq: Once | ORAL | Status: AC
  Administered 2019-07-23: 50 mg via ORAL
  Filled 2019-07-23: qty 2

## 2019-07-23 MED ORDER — DIPHENHYDRAMINE HCL 25 MG PO CAPS
50.0000 mg | ORAL_CAPSULE | Freq: Once | ORAL | Status: AC
  Administered 2019-07-23: 50 mg via ORAL
  Filled 2019-07-23: qty 2

## 2019-07-23 MED ORDER — THIAMINE HCL 100 MG PO TABS
100.0000 mg | ORAL_TABLET | Freq: Every day | ORAL | Status: DC
  Administered 2019-07-23 – 2019-07-25 (×3): 100 mg via ORAL
  Filled 2019-07-23 (×3): qty 1

## 2019-07-23 NOTE — ED Notes (Signed)
Date and time results received: 07/23/19 2306 (use smartphrase ".now" to insert current time)  Test: COVID Critical Value: Positive  Name of Provider Notified: Maryan Rued, MD  Orders Received? Or Actions Taken?: Orders Received - See Orders for details

## 2019-07-23 NOTE — ED Provider Notes (Signed)
[facility] DEPT Provider Note   CSN: [id number] Arrival date & time: 07/23/19  1952     History Chief Complaint  Patient presents with  . Suicidal    Kiely Tennessee is a 53 y.o. female.  Patient is a 53 year old female with a history of anxiety, depression, chronic alcohol abuse who is presenting today with suicidal ideation.  Patient states for the last month she has been going through a lot with relationship issues, family issues and a lot of stress.  She is drinking daily but has increased her alcohol use more recently.  Yesterday she states she attempted to take her life by taking Ativan and drinking alcohol.  There is a period of time that she does not remember at all but she did wake up today.  She has had some vomiting and is feeling anxious, stressed and endorses wanting to kill herself.  She stopped taking her Lexapro and Abilify last week.  She denies any cocaine use but continues to smoke cigarettes.  She denies any abdominal pain, fevers but has noticed a itchy rash intermittently for the last 2 days.  She cannot think of anything new that she has done and she has not started any new medications.  The history is provided by the patient.       Past Medical History:  Diagnosis Date  . Anxiety   . Depression   . GERD (gastroesophageal reflux disease)   . Ovarian cyst     Patient Active Problem List   Diagnosis Date Noted  . History of colon polyps 12/09/2018  . Substance abuse in remission ([place]) 12/09/2018    Past Surgical History:  Procedure Laterality Date  . APPENDECTOMY  age 3  . TONSILLECTOMY Bilateral age 40     OB History   No obstetric history on file.     Family History  Problem Relation Age of Onset  . Asthma Mother   . COPD Mother   . Cancer Father        thyroid cancer    Social History   Tobacco Use  . Smoking status: Current Every Day Smoker    Packs/day: 1.00    Years: 35.00    Pack years: 35.00     Types: Cigarettes  . Smokeless tobacco: Never Used  Substance Use Topics  . Alcohol use: Not Currently    Comment: alcoholic.  none since 2/92/44  . Drug use: Not Currently    Types: "Crack" cocaine    Comment: last use was 11-15-18    Home Medications Prior to Admission medications   Medication Sig Start Date End Date Taking? Authorizing Provider  ARIPiprazole (ABILIFY) 10 MG tablet Take 1 tablet (10 mg total) by mouth daily. 01/24/19  Yes Recardo Evangelist, PA-C  escitalopram (LEXAPRO) 20 MG tablet Take 20 mg by mouth daily.    Yes [provider]  ondansetron (ZOFRAN) 4 MG tablet Take 1 tablet (4 mg total) by mouth every 6 (six) hours. Patient not taking: Reported on 05/27/2019 01/24/19   Recardo Evangelist, PA-C    Allergies    Patient has no known allergies.  Review of Systems   Review of Systems  All other systems reviewed and are negative.   Physical Exam Updated Vital Signs BP (!) 138/100 (BP Location: Left Arm)   Pulse (!) 112   Temp 98.4 F (36.9 C) (Oral)   Resp 20   SpO2 96%   Physical Exam Vitals and nursing note reviewed.  Constitutional:      General: She is not in acute distress.    Appearance: She is well-developed and normal weight.  HENT:     Head: Normocephalic and atraumatic.     Nose: Nose normal.     Mouth/Throat:     Mouth: Mucous membranes are moist.  Eyes:     Pupils: Pupils are equal, round, and reactive to light.  Cardiovascular:     Rate and Rhythm: Regular rhythm. Tachycardia present.     Heart sounds: Normal heart sounds. No murmur. No friction rub.  Pulmonary:     Effort: Pulmonary effort is normal.     Breath sounds: Normal breath sounds. No wheezing or rales.  Abdominal:     General: Bowel sounds are normal. There is no distension.     Palpations: Abdomen is soft.     Tenderness: There is no abdominal tenderness. There is no guarding or rebound.  Musculoskeletal:        General: No tenderness. Normal range of  motion.     Comments: No edema  Skin:    General: Skin is warm and dry.     Findings: No rash.     Comments: No notable urticaria or rash present at this time  Neurological:     Mental Status: She is alert and oriented to person, place, and time.     Cranial Nerves: No cranial nerve deficit.  Psychiatric:        Attention and Perception: She does not perceive auditory or visual hallucinations.        Mood and Affect: Mood is anxious. Affect is tearful.        Speech: Speech normal.        Behavior: Behavior normal.        Thought Content: Thought content includes suicidal ideation. Thought content includes suicidal plan.     ED Results / Procedures / Treatments   Labs (all labs ordered are listed, but only abnormal results are displayed) Labs Reviewed  CBC WITH DIFFERENTIAL/PLATELET - Abnormal; Notable for the following components:      Result Value   Lymphs Abs 4.1 (*)    All other components within normal limits  COMPREHENSIVE METABOLIC PANEL - Abnormal; Notable for the following components:   CO2 21 (*)    Glucose, Bld 120 (*)    Calcium 8.4 (*)    AST 49 (*)    ALT 46 (*)    All other components within normal limits  ETHANOL - Abnormal; Notable for the following components:   Alcohol, Ethyl (B) 230 (*)    All other components within normal limits  ACETAMINOPHEN LEVEL - Abnormal; Notable for the following components:   Acetaminophen (Tylenol), Serum <10 (*)    All other components within normal limits  RESPIRATORY PANEL BY RT PCR (FLU A&B, COVID)  LIPASE, BLOOD  SALICYLATE LEVEL  RAPID URINE DRUG SCREEN, HOSP PERFORMED    EKG None  Radiology No results found.  Procedures Procedures (including critical care time)  Medications Ordered in ED Medications  ondansetron (ZOFRAN-ODT) disintegrating tablet 4 mg (has no administration in time range)  diphenhydrAMINE (BENADRYL) capsule 50 mg (has no administration in time range)    ED Course  I have reviewed the  triage vital signs and the nursing notes.  Pertinent labs & imaging results that were available during my care of the patient were reviewed by me and considered in my medical decision making (see chart for details).    MDM  Rules/Calculators/A&P                      Patient is a 53 year old female presenting today with SI, heavy alcohol use and history of mental health issues and has been off medication now for a week.  Patient attempted to take her life last night by drinking alcohol and taking Ativan.  She last drink approximately 45 minutes ago.  She states she has had some vomiting and is scared that she may withdrawal.  She is mildly tachycardic here at 112 but otherwise in no acute distress.  Low suspicion for withdrawal at this time as patient just recently stopped drinking alcohol.  However will place patient on CIWA get medical screening labs and if clear will have TTS evaluate.  9:14 PM Patient's labs are without acute findings other than mild transaminitis which is most likely from drinking.  Patient has an alcohol level of 230 so no evidence of withdrawal at this time.  Patient was placed in psych hold and have TTS evaluate. Final Clinical Impression(s) / ED Diagnoses Final diagnoses:  Suicidal ideation    Rx / DC Orders ED Discharge Orders    None       Gwyneth SproutPlunkett, Erian Lariviere, MD 07/23/19 2115

## 2019-07-23 NOTE — ED Triage Notes (Signed)
Pt reports SI for a few days. Pt reports family/relationship issues that caused SI. Pt reports not taking prescription Lexapro and Abilify over the last few days. Pt reports taking Ativan while drinking yesterday with an attempt to hurt herself.

## 2019-07-24 DIAGNOSIS — F1094 Alcohol use, unspecified with alcohol-induced mood disorder: Secondary | ICD-10-CM

## 2019-07-24 DIAGNOSIS — F1420 Cocaine dependence, uncomplicated: Secondary | ICD-10-CM

## 2019-07-24 LAB — RAPID URINE DRUG SCREEN, HOSP PERFORMED
Amphetamines: NOT DETECTED
Barbiturates: NOT DETECTED
Benzodiazepines: POSITIVE — AB
Cocaine: NOT DETECTED
Opiates: NOT DETECTED
Tetrahydrocannabinol: NOT DETECTED

## 2019-07-24 MED ORDER — ONDANSETRON 4 MG PO TBDP
4.0000 mg | ORAL_TABLET | Freq: Once | ORAL | Status: AC
  Administered 2019-07-24: 4 mg via ORAL
  Filled 2019-07-24: qty 1

## 2019-07-24 MED ORDER — ACETAMINOPHEN 325 MG PO TABS
650.0000 mg | ORAL_TABLET | Freq: Once | ORAL | Status: AC
  Administered 2019-07-24: 650 mg via ORAL
  Filled 2019-07-24: qty 2

## 2019-07-24 MED ORDER — GABAPENTIN 100 MG PO CAPS
200.0000 mg | ORAL_CAPSULE | Freq: Two times a day (BID) | ORAL | Status: DC
  Administered 2019-07-24 – 2019-07-25 (×2): 200 mg via ORAL
  Filled 2019-07-24 (×2): qty 2

## 2019-07-24 MED ORDER — ONDANSETRON 4 MG PO TBDP
4.0000 mg | ORAL_TABLET | Freq: Three times a day (TID) | ORAL | Status: DC | PRN
  Administered 2019-07-24 – 2019-07-25 (×2): 4 mg via ORAL
  Filled 2019-07-24 (×2): qty 1

## 2019-07-24 NOTE — ED Notes (Signed)
Pt said is very anxious and scared and would like to speak with her mother. I gave her a cordless phone to call her mother.

## 2019-07-24 NOTE — Consult Note (Addendum)
Thedore Mins, MDTelepsych Consultation   Reason for Consult: Suicidal ideations Referring Physician: Wonda Olds, EDP Location of Patient:  Location of Provider: [facility]  Patient Identification: Kristina Owens MRN:  [id number] Principal Diagnosis: Alcohol abuse with alcohol-induced mood disorder (HCC) Diagnosis:  Principal Problem:   Alcohol abuse with alcohol-induced mood disorder (HCC) Active Problems:   Cocaine use disorder, severe, dependence (HCC)   Total Time spent with patient: 30 minutes  Subjective:   Kristina Owens is a 53 y.o. female patient admitted with suicidal ideations. Patient assessed by nurse practitioner.  Patient alert and oriented, answers appropriately patient reports "I am an alcoholic I relapsed 2 weeks ago, I stopped my meds because I was trying to lose weight and my depression and anxiety got really bad."  Patient reports also smoking crack.  Patient reports being homeless.  Patient currently seen at [facility] outpatient, patient reports she is diagnosed with anxiety, depression, PTSD, OCD and ADHD."  Patient currently prescribed Lexapro, Abilify, and trazodone. Patient denies suicidal and homicidal ideations today.  Patient denies access to weapons.  Patient denies auditory and visual hallucinations.  Patient request help with substance use disorder.  Patient would like inpatient substance use treatment.     HPI:  Kristina Owens is a 53 y.o. female who came to [facility] due to increasing SI over the last few days due to increasing depression and ongoing alcohol dependency. Kristina states, "my alcoholism is out of control. I relapsed 2 weeks ago. When I don't take my meds I relapse." Kristina states she is prescribed Abilify, Lexapro, and Trazadone, though she is able to recognize that she only takes these medications 3-4 days/week. Kristina is able to recognize that, by not taking her prescribed doses, she is not getting the full  therapeutic dose, and use, of the medications she is prescribed. Kristina Owens at [facility] for her prescriptions and that she has been seeing him for approximately 6 months. Kristina denies she currently has a therapist. 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   Past Psychiatric History: Alcohol abuse with alcohol-induced mood disorder, cocaine use disorder  Risk to Self: Suicidal Ideation: Yes-Currently Present Suicidal Intent: Yes-Currently Present Is patient at risk for suicide?: Yes Suicidal Plan?: No Access to Means: Yes Specify Access to Suicidal Means: Kristina has access to her medication & EtOH What has been your use of drugs/alcohol within the last 12 months?: Kristina acknowledges the use of EtOH & crack cocaine How many times?: 1 Other Self Harm Risks: None noted Triggers for Past Attempts: Unpredictable Intentional Self Injurious Behavior: None Risk to Others: Homicidal Ideation: No Thoughts of Harm to Others: No Current Homicidal Intent: No Current Homicidal Plan: No Access to Homicidal Means: No Identified Victim: None noted History of harm to others?: No Assessment of Violence: None Noted Violent Behavior Description: None noted Does patient have access to weapons?: (Kristina denies access to guns/weapons) Criminal Charges Pending?: No Does patient have a court date: No Prior Inpatient Therapy: Prior Inpatient Therapy: Yes Prior Therapy Dates: Multiple - cannot remember dates Prior Therapy Facilty/Provider(s): Cannot remember Reason for Treatment: SA Prior Outpatient Therapy: Prior Outpatient Therapy: Yes Prior Therapy Dates: Cannot remember  Prior Therapy Facilty/Provider(s): Cannot  remember Reason for Treatment: SA, MDD Does patient have an ACCT team?: No Does patient have Intensive In-House Services?  : No Does patient have [facility] services? : Yes Does patient have P4CC services?: No  Past Medical History:  Past Medical History:  Diagnosis Date  . Anxiety   . Depression   . GERD (gastroesophageal reflux disease)   . Ovarian cyst     Past Surgical History:  Procedure Laterality Date  . APPENDECTOMY  age 2  . TONSILLECTOMY Bilateral age 62   Family History:  Family History  Problem Relation Age of Onset  . Asthma Mother   . COPD Mother   . Cancer Father        thyroid cancer   Family Psychiatric  History: Denies Social History:  Social History   Substance and Sexual Activity  Alcohol Use Not Currently   Comment: alcoholic.  none since 11/15/18     Social History   Substance and Sexual Activity  Drug Use Not Currently  . Types: "Crack" cocaine   Comment: last use was 11-15-18    Social History   Socioeconomic History  . Marital status: Divorced    Spouse name: Not on file  . Number of children: Not on file  . Years of education: Not on file  . Highest education level: Not on file  Occupational History  . Not on file  Tobacco Use  . Smoking status: Current Every Day Smoker    Packs/day: 1.00    Years: 35.00    Pack years: 35.00    Types: Cigarettes  . Smokeless tobacco: Never Used  Substance and Sexual Activity  . Alcohol use: Not Currently    Comment: alcoholic.  none since 11/15/18  . Drug use: Not Currently    Types: "Crack" cocaine    Comment: last use was 11-15-18  . Sexual activity: Not on file  Other Topics Concern  . Not on file  Social History Narrative  . Not on file   Social Determinants of Health   Financial Resource Strain:   . Difficulty of Paying Living Expenses: Not on file  Food Insecurity:   . Worried About Programme researcher, broadcasting/film/video in the Last Year: Not on file  . Ran Out of Food in the Last Year: Not on file   Transportation Needs:   . Lack of Transportation (Medical): Not on file  . Lack of Transportation (Non-Medical): Not on file  Physical Activity:   . Days of Exercise Kristina Week: Not on file  . Minutes of Exercise Kristina Session: Not on file  Stress:   . Feeling of Stress : Not on file  Social Connections:   . Frequency of Communication with Friends and Family: Not on file  . Frequency of Social Gatherings with Friends and Family: Not on file  . Attends Religious Services: Not on file  . Active Member of Clubs or Organizations: Not on file  . Attends Banker Meetings: Not on file  . Marital Status: Not on file   Additional Social History:    Allergies:  No Known Allergies  Labs:  Results for orders placed or performed during the hospital encounter of 07/23/19 (from the past 48 hour(s))  CBC with Differential     Status: Abnormal   Collection Time: 07/23/19  8:08 PM  Result Value Ref Range   WBC 10.2 4.0 - 10.5 K/uL   RBC 4.98 3.87 - 5.11 MIL/uL   Hemoglobin 14.9  12.0 - 15.0 g/dL   HCT 43.9 36.0 - 46.0 %   MCV 88.2 80.0 - 100.0 fL   MCH 29.9 26.0 - 34.0 pg   MCHC 33.9 30.0 - 36.0 g/dL   RDW 13.7 11.5 - 15.5 %   Platelets 253 150 - 400 K/uL   nRBC 0.0 0.0 - 0.2 %   Neutrophils Relative % 50 %   Neutro Abs 5.1 1.7 - 7.7 K/uL   Lymphocytes Relative 41 %   Lymphs Abs 4.1 (H) 0.7 - 4.0 K/uL   Monocytes Relative 7 %   Monocytes Absolute 0.7 0.1 - 1.0 K/uL   Eosinophils Relative 1 %   Eosinophils Absolute 0.1 0.0 - 0.5 K/uL   Basophils Relative 0 %   Basophils Absolute 0.0 0.0 - 0.1 K/uL   Immature Granulocytes 1 %   Abs Immature Granulocytes 0.07 0.00 - 0.07 K/uL    Comment: Performed at [facility], [place] [address] [place], [place] [address]  Comprehensive metabolic panel     Status: Abnormal   Collection Time: 07/23/19  8:08 PM  Result Value Ref Range   Sodium 139 135 - 145 mmol/L   Potassium 3.9 3.5 - 5.1 mmol/L   Chloride 107 98 - 111  mmol/L   CO2 21 (L) 22 - 32 mmol/L   Glucose, Bld 120 (H) 70 - 99 mg/dL   BUN 17 6 - 20 mg/dL   Creatinine, Ser 0.59 0.44 - 1.00 mg/dL   Calcium 8.4 (L) 8.9 - 10.3 mg/dL   Total Protein 7.1 6.5 - 8.1 g/dL   Albumin 3.9 3.5 - 5.0 g/dL   AST 49 (H) 15 - 41 U/L   ALT 46 (H) 0 - 44 U/L   Alkaline Phosphatase 106 38 - 126 U/L   Total Bilirubin 0.5 0.3 - 1.2 mg/dL   GFR calc non Af Amer >60 >60 mL/min   GFR calc Af Amer >60 >60 mL/min   Anion gap 11 5 - 15    Comment: Performed at [facility], [place] [address] [place], [place] [address]  Lipase     Status: None   Collection Time: 07/23/19  8:08 PM  Result Value Ref Range   Lipase 33 11 - 51 U/L    Comment: Performed at [facility], [place] [address] [place], [place] [address]  EtOH     Status: Abnormal   Collection Time: 07/23/19  8:08 PM  Result Value Ref Range   Alcohol, Ethyl (B) 230 (H) <10 mg/dL    Comment: (NOTE) Lowest detectable limit for serum alcohol is 10 mg/dL. For medical purposes only. Performed at [facility], [place] [address] [place], [place] [address]   Urine rapid drug screen (hosp performed)not at [facility]     Status: Abnormal   Collection Time: 07/23/19  8:08 PM  Result Value Ref Range   Opiates NONE DETECTED NONE DETECTED   Cocaine NONE DETECTED NONE DETECTED   Benzodiazepines POSITIVE (A) NONE DETECTED   Amphetamines NONE DETECTED NONE DETECTED   Tetrahydrocannabinol NONE DETECTED NONE DETECTED   Barbiturates NONE DETECTED NONE DETECTED    Comment: (NOTE) DRUG SCREEN FOR MEDICAL PURPOSES ONLY.  IF CONFIRMATION IS NEEDED FOR ANY PURPOSE, NOTIFY LAB WITHIN 5 DAYS. LOWEST DETECTABLE LIMITS FOR URINE DRUG SCREEN Drug Class                     Cutoff (ng/mL) Amphetamine and metabolites    1000  Barbiturate and metabolites    200 Benzodiazepine                 200 Tricyclics and metabolites     300 Opiates and metabolites        300 Cocaine and  metabolites        300 THC                            50 Performed at [facility], [address] [place], [place] [address]   Acetaminophen Level     Status: Abnormal   Collection Time: 07/23/19  8:08 PM  Result Value Ref Range   Acetaminophen (Tylenol), Serum <10 (L) 10 - 30 ug/mL    Comment: (NOTE) Therapeutic concentrations vary significantly. A range of 10-30 ug/mL  may be an effective concentration for many patients. However, some  are best treated at concentrations outside of this range. Acetaminophen concentrations >150 ug/mL at 4 hours after ingestion  and >50 ug/mL at 12 hours after ingestion are often associated with  toxic reactions. Performed at [facility], [address], [place], [place] [address]   Salicylate Level     Status: None   Collection Time: 07/23/19  8:08 PM  Result Value Ref Range   Salicylate Lvl <7.0 2.8 - 30.0 mg/dL    Comment: Performed at [facility], [address], [place], [place] [address]  Respiratory Panel by RT PCR (Flu A&B, Covid) - Nasopharyngeal Swab     Status: Abnormal   Collection Time: 07/23/19  9:13 PM   Specimen: Nasopharyngeal Swab  Result Value Ref Range   SARS Coronavirus 2 by RT PCR POSITIVE (A) NEGATIVE    Comment: RESULT CALLED TO, READ BACK BY AND VERIFIED WITH: WICKER, RN ON 07/23/19 @ 2306 BY LE (NOTE) SARS-CoV-2 target nucleic acids are DETECTED. SARS-CoV-2 RNA is generally detectable in upper respiratory specimens  during the acute phase of infection. Positive results are indicative of the presence of the identified virus, but do not rule out bacterial infection or co-infection with other pathogens not detected by the test. Clinical correlation with patient history and other diagnostic information is necessary to determine patient infection status. The expected result is Negative. Fact Sheet for Patients:  https://www.moore.com/ Fact  Sheet for Healthcare Providers: https://www.young.biz/ This test is not yet approved or cleared by the [place] FDA and  has been authorized for detection and/or diagnosis of SARS-CoV-2 by FDA under an Emergency Use Authorization (EUA).  This EUA will remain in effect (meaning this test can be used) f or the duration of  the COVID-19 declaration under Section 564(b)(1) of the Act, 21 U.S.C. section 360bbb-3(b)(1), unless the authorization is terminated or revoked sooner.    Influenza A by PCR NEGATIVE NEGATIVE   Influenza B by PCR NEGATIVE NEGATIVE    Comment: (NOTE) The Xpert Xpress SARS-CoV-2/FLU/RSV assay is intended as an aid in  the diagnosis of influenza from Nasopharyngeal swab specimens and  should not be used as a sole basis for treatment. Nasal washings and  aspirates are unacceptable for Xpert Xpress SARS-CoV-2/FLU/RSV  testing. Fact Sheet for Patients: https://www.moore.com/ Fact Sheet for Healthcare Providers: https://www.young.biz/ This test is not yet approved or cleared by the [place] FDA and  has been authorized for detection and/or diagnosis of SARS-CoV-2 by  FDA under an Emergency Use Authorization (EUA). This EUA will remain  in effect (meaning this test  can be used) for the duration of the  Covid-19 declaration under Section 564(b)(1) of the Act, 21  U.S.C. section 360bbb-3(b)(1), unless the authorization is  terminated or revoked. Performed at [facility], [address] [place], [place] [address]     Medications:  Current Facility-Administered Medications  Medication Dose Route Frequency Provider Last Rate Last Admin  . ARIPiprazole (ABILIFY) tablet 10 mg  10 mg Oral Daily Gwyneth SproutPlunkett, Whitney, MD   10 mg at 07/24/19 1016  . escitalopram (LEXAPRO) tablet 20 mg  20 mg Oral Daily Gwyneth SproutPlunkett, Whitney, MD   20 mg at 07/24/19 1016  . gabapentin (NEURONTIN) capsule 200 mg  200  mg Oral BID Cornelio Parkerson, MD      . LORazepam (ATIVAN) injection 0-4 mg  0-4 mg Intravenous Q6H Gwyneth SproutPlunkett, Whitney, MD       Or  . LORazepam (ATIVAN) tablet 0-4 mg  0-4 mg Oral Q6H Plunkett, Whitney, MD   2 mg at 07/24/19 1016  . [START ON 07/26/2019] LORazepam (ATIVAN) injection 0-4 mg  0-4 mg Intravenous Q12H Gwyneth SproutPlunkett, Whitney, MD       Or  . Melene Muller[START ON 07/26/2019] LORazepam (ATIVAN) tablet 0-4 mg  0-4 mg Oral Q12H Plunkett, Whitney, MD      . nicotine (NICODERM CQ - dosed in mg/24 hours) patch 21 mg  21 mg Transdermal Daily Gwyneth SproutPlunkett, Whitney, MD   21 mg at 07/23/19 2146  . ondansetron (ZOFRAN-ODT) disintegrating tablet 4 mg  4 mg Oral Q8H PRN Manasvini Whatley, MD      . thiamine tablet 100 mg  100 mg Oral Daily Plunkett, Whitney, MD   100 mg at 07/24/19 1016   Or  . thiamine (B-1) injection 100 mg  100 mg Intravenous Daily Gwyneth SproutPlunkett, Whitney, MD       Current Outpatient Medications  Medication Sig Dispense Refill  . ARIPiprazole (ABILIFY) 10 MG tablet Take 1 tablet (10 mg total) by mouth daily. 30 tablet 0  . escitalopram (LEXAPRO) 20 MG tablet Take 20 mg by mouth daily.     . ondansetron (ZOFRAN) 4 MG tablet Take 1 tablet (4 mg total) by mouth every 6 (six) hours. (Patient not taking: Reported on 05/27/2019) 12 tablet 0    Musculoskeletal: Strength & Muscle Tone: within normal limits Gait & Station: normal Patient leans: N/A  Psychiatric Specialty Exam: Physical Exam  Nursing note and vitals reviewed. Constitutional: She is oriented to person, place, and time. She appears well-developed.  HENT:  Head: Normocephalic.  Cardiovascular: Normal rate.  Respiratory: Effort normal.  Neurological: She is alert and oriented to person, place, and time.  Psychiatric: Her behavior is normal. Judgment and thought content normal.    Review of Systems  Constitutional: Negative.   HENT: Negative.   Eyes: Negative.   Respiratory: Negative.   Cardiovascular: Negative.   Gastrointestinal:  Negative.   Genitourinary: Negative.   Musculoskeletal: Negative.   Skin: Negative.   Neurological: Negative.   Psychiatric/Behavioral: Negative.     Blood pressure 99/65, pulse 76, temperature 98.7 F (37.1 C), temperature source Oral, resp. rate 18, SpO2 93 %.There is no height or weight on file to calculate BMI.  General Appearance: Casual and Fairly Groomed  Eye Contact:  Good  Speech:  Clear and Coherent and Normal Rate  Volume:  Normal  Mood:  Depressed  Affect:  Appropriate and Congruent  Thought Process:  Coherent, Goal Directed and Descriptions of Associations: Intact  Orientation:  Full (Time, Place, and Person)  Thought  Content:  WDL and Logical  Suicidal Thoughts:  No  Homicidal Thoughts:  No  Memory:  Immediate;   Good Recent;   Good Remote;   Good  Judgement:  Good  Insight:  Good  Psychomotor Activity:  Normal  Concentration:  Concentration: Good and Attention Span: Good  Recall:  Good  Fund of Knowledge:  Good  Language:  Good  Akathisia:  No  Handed:  Right  AIMS (if indicated):     Assets:  Communication Skills Desire for Improvement Financial Resources/Insurance Resilience  ADL's:  Intact  Cognition:  WNL  Sleep:        Treatment Plan Summary: Daily contact with patient to assess and evaluate symptoms and progress in treatment  Recommend patient be observed overnight.  Tentative plan for discharge on Monday 12/21 /2020 with substance use resources.  Follow-up with established psychiatrist Dr. Algis Owens at [place]. Patient inappropriate for behavioral health observation area at this time as she is Covid positive.  Disposition: No evidence of imminent risk to self or others at present.   Patient does not meet criteria for psychiatric inpatient admission. Supportive therapy provided about ongoing stressors. Discussed crisis plan, support from social network, calling 911, coming to the Emergency Department, and calling Suicide Hotline.  This service  was provided via telemedicine using a 2-way, interactive audio and video technology.  Names of all persons participating in this telemedicine service and their role in this encounter. Name: [facility] Role: Patient  Name: Kristina Owens Role: FNP    Patrcia Dolly, FNP 07/24/2019 12:15 PM   Patient seen face-to-face for psychiatric evaluation, chart reviewed and case discussed with the physician extender and developed treatment plan. Reviewed the information documented and agree with the treatment plan.

## 2019-07-24 NOTE — ED Notes (Signed)
Patient cooperative during TTS consult

## 2019-07-24 NOTE — ED Notes (Signed)
[place] notified this RN of patient's current plan of care based off of her TTS consult today. Plan of care is to continue observation and TTS will contact patient again later this morning.

## 2019-07-24 NOTE — Patient Outreach (Signed)
[facility] provided the patient's nurse with information for substance use recovery resources to give to the patient. Some of these resources include a residential/outpatient substance use treatment center list, online/in-person [employer], detox center list, [place] women's [facility] list/flier detailing [employer], and a Arts development officer for [employer] of the [place] online/in-person outpatient dual diagnosis treatment services. This information also included written instructions on how to contact [facility] for further questions/help with getting connected to substance use recovery resources. Per chart review, patient reports a history of daily alcohol and crack cocaine use after relapsing 2 weeks ago.
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
## 2019-07-25 ENCOUNTER — Encounter (HOSPITAL_COMMUNITY): Payer: Self-pay | Admitting: Registered Nurse

## 2019-07-25 DIAGNOSIS — R45851 Suicidal ideations: Secondary | ICD-10-CM | POA: Insufficient documentation

## 2019-07-25 DIAGNOSIS — F1094 Alcohol use, unspecified with alcohol-induced mood disorder: Secondary | ICD-10-CM

## 2019-07-25 DIAGNOSIS — F1420 Cocaine dependence, uncomplicated: Secondary | ICD-10-CM

## 2019-07-25 MED ORDER — GABAPENTIN 100 MG PO CAPS: 200.0000 mg | ORAL_CAPSULE | Freq: Two times a day (BID) | ORAL | 0 refills | Status: DC

## 2019-07-25 NOTE — Discharge Instructions (Addendum)
For your behavioral health needs, you are advised to continue treatment with [facility]: ° °     [facility] °     [address] °     [place], [place] [address] °     [phone] °     Crisis number: [phone] ° °

## 2019-07-25 NOTE — Progress Notes (Signed)
CSW provided patient with information on the [employer] quarantine hotel and filled out the paperwork if she chose to go. Patient stated that she did not want to move forward with going to the hotel as she did not want to go to the homeless shelter afterwards and reports she plans to make arrangements to go else where. CSW explained in order to get into the program, patient has to agree to go to a homeless shelter. If she chooses not to go she will have to discuss this with the Health Department and [facility]. Patient reports she will think about it. CSW spoke with EDP and RN to make them aware. Per EDP, if patient chooses not to go she will have to make her own arrangements.   [place]en Circle, LCSW Transitions of Care Department [facility] ED [phone]

## 2019-07-25 NOTE — ED Provider Notes (Addendum)
  Physical Exam  BP 122/80 (BP Location: Left Arm)   Pulse 94   Temp 98.6 F (37 C) (Oral)   Resp 16   SpO2 94%   Physical Exam  ED Course/Procedures     Procedures  MDM  Patient's been cleared by psychiatry.  Also Covid positive.  Social work gave some resources but patient does not want to go to any other places.  Will discharge  Discussed with social work.  Patient will reportedly go to Covid housing.       Davonna Belling, MD 07/25/19 1615    Davonna Belling, MD 07/25/19 1743

## 2019-07-25 NOTE — BH Assessment (Signed)
[facility] Assessment Progress Note  Per Shuvon Rankin, FNP, this pt does not require psychiatric hospitalization at this time.  Pt is to be discharged from [facility] with recommendation to continue treatment with [facility].  This has been included in pt's discharge instructions.  A social work consult have been ordered for this pt.  Pt's nurse has been notified.  Jalene Mullet, [place] Triage Specialist [phone]

## 2019-07-25 NOTE — Progress Notes (Signed)
CSW received pt's signed paperwork and postive COVID results and forwarded them to [place] at [facility] for the Health Dept.  CSW updated RN that pot's transport is en route and set to arrive in minutes.  CSW will continue to follow for D/C needs.  Alphonse Guild. Mirah Nevins, LCSW, LCAS, CSI Transitions of Care Clinical Social Worker Care Coordination Department Ph: [phone]

## 2019-07-25 NOTE — Consult Note (Signed)
[facility]  07/25/2019 1:28 PM Kristina DikeJennifer Owens  MRN:  [id number] Principal Problem: Alcohol-induced mood disorder [facility](HCC) Discharge Diagnoses: Principal Problem:   Alcohol-induced mood disorder (HCC) Active Problems:   Cocaine use disorder, severe, dependence (HCC)   Subjective: Kristina Owens, 53 y.o., female patient seen via tele psych by this provider, Dr. Lucianne MussKumar; and chart reviewed on 07/25/19.  On evaluation Kristina Owens was psychiatrically cleared by Dr. Jannifer FranklinAkintayo yesterday.  Patient seen again today via telepsych.  Patient denies suicidal/self-harm/homicidal ideation, psychosis, and paranoia.  Patient reporting that [place] [facility] [place] to [place] but states she is testing Covid positive at this time not sure if she can go back.  [place] [facility] [place] homeless informed that a social work consult will be entered to assist her with someone to go for isolation.  Patient states once isolation is completed she would like [place] [facility]. [name] is alert/oriented x 4; calm/cooperative; and mood is congruent with affect.  She does not appear to be responding to internal/external stimuli or delusional thoughts.  Patient denies suicidal/self-harm/homicidal ideation, psychosis, and paranoia.  Patient answered question appropriately.  Spoke with Dr. Ranae PalmsYelverton inform the patient status of Covid positive and has been psychiatrically cleared.  Social work consult has been ordered related to patient having nowhere to go for isolation were Covid positive.  States he will discharge once social work is completed with patient.   Total Time spent with patient: 30 minutes  Past Psychiatric History: See below  Past Medical History:  Past Medical History:  Diagnosis Date  . Anxiety   . Depression   . GERD (gastroesophageal reflux disease)   . Ovarian cyst     Past Surgical History:  Procedure Laterality Date   . APPENDECTOMY  age 53  . TONSILLECTOMY Bilateral age 53   Family History:  Family History  Problem Relation Age of Onset  . Asthma Mother   . COPD Mother   . Cancer Father        thyroid cancer   Family Psychiatric  History: Unaware Social History:  Social History   Substance and Sexual Activity  Alcohol Use Not Currently   Comment: alcoholic.  none since 11/15/18     Social History   Substance and Sexual Activity  Drug Use Not Currently  . Types: "Crack" cocaine   Comment: last use was 11-15-18    Social History   Socioeconomic History  . Marital status: Divorced    Spouse name: Not on file  . Number of children: Not on file  . Years of education: Not on file  . Highest education level: Not on file  Occupational History  . Not on file  Tobacco Use  . Smoking status: Current Every Day Smoker    Packs/day: 1.00    Years: 35.00    Pack years: 35.00    Types: Cigarettes  . Smokeless tobacco: Never Used  Substance and Sexual Activity  . Alcohol use: Not Currently    Comment: alcoholic.  none since 11/15/18  . Drug use: Not Currently    Types: "Crack" cocaine    Comment: last use was 11-15-18  . Sexual activity: Not on file  Other Topics Concern  . Not on file  Social History Narrative  . Not on file   Social Determinants of Health   Financial Resource Strain:   . Difficulty of Paying Living Expenses: Not on file  Food Insecurity:   .  Worried About Charity fundraiser in the Last Year: Not on file  . Ran Out of Food in the Last Year: Not on file  Transportation Needs:   . Lack of Transportation (Medical): Not on file  . Lack of Transportation (Non-Medical): Not on file  Physical Activity:   . Days of Exercise per Week: Not on file  . Minutes of Exercise per Session: Not on file  Stress:   . Feeling of Stress : Not on file  Social Connections:   . Frequency of Communication with Friends and Family: Not on file  . Frequency of Social Gatherings with  Friends and Family: Not on file  . Attends Religious Services: Not on file  . Active Member of Clubs or Organizations: Not on file  . Attends Archivist Meetings: Not on file  . Marital Status: Not on file    Has this patient used any form of tobacco in the last 30 days? (Cigarettes, Smokeless Tobacco, Cigars, and/or Pipes) A prescription for an FDA-approved tobacco cessation medication was offered at discharge and the patient refused  Current Medications: Current Facility-Administered Medications  Medication Dose Route Frequency Provider Last Rate Last Admin  . ARIPiprazole (ABILIFY) tablet 10 mg  10 mg Oral Daily Blanchie Dessert, MD   10 mg at 07/25/19 1039  . escitalopram (LEXAPRO) tablet 20 mg  20 mg Oral Daily Blanchie Dessert, MD   20 mg at 07/25/19 1039  . gabapentin (NEURONTIN) capsule 200 mg  200 mg Oral BID Darleene Cleaver, Mojeed, MD   200 mg at 07/25/19 1039  . LORazepam (ATIVAN) injection 0-4 mg  0-4 mg Intravenous Q6H Blanchie Dessert, MD   Stopped at 07/25/19 [phone]   Or  . LORazepam (ATIVAN) tablet 0-4 mg  0-4 mg Oral Q6H Blanchie Dessert, MD   2 mg at 07/25/19 0839  . [START ON 07/26/2019] LORazepam (ATIVAN) injection 0-4 mg  0-4 mg Intravenous Q12H Blanchie Dessert, MD       Or  . Derrill Memo ON 07/26/2019] LORazepam (ATIVAN) tablet 0-4 mg  0-4 mg Oral Q12H Plunkett, Whitney, MD      . nicotine (NICODERM CQ - dosed in mg/24 hours) patch 21 mg  21 mg Transdermal Daily Blanchie Dessert, MD   21 mg at 07/25/19 1039  . ondansetron (ZOFRAN-ODT) disintegrating tablet 4 mg  4 mg Oral Q8H PRN Corena Pilgrim, MD   4 mg at 07/25/19 0839  . thiamine tablet 100 mg  100 mg Oral Daily Blanchie Dessert, MD   100 mg at 07/25/19 1039   Or  . thiamine (B-1) injection 100 mg  100 mg Intravenous Daily Blanchie Dessert, MD       Current Outpatient Medications  Medication Sig Dispense Refill  . ARIPiprazole (ABILIFY) 10 MG tablet Take 1 tablet (10 mg total) by mouth daily. 30 tablet 0   . escitalopram (LEXAPRO) 20 MG tablet Take 20 mg by mouth daily.     Marland Kitchen gabapentin (NEURONTIN) 100 MG capsule Take 2 capsules (200 mg total) by mouth 2 (two) times daily. 60 capsule 0   PTA Medications: (Not in a hospital admission)   Musculoskeletal: Strength & Muscle Tone: within normal limits Gait & Station: normal Patient leans: N/A  Psychiatric Specialty Exam: Physical Exam Vitals and nursing note reviewed.  Constitutional:      Appearance: Normal appearance.  Pulmonary:     Effort: Pulmonary effort is normal.  Musculoskeletal:        General: Normal range of motion.  Neurological:  Mental Status: She is alert.  Psychiatric:        Mood and Affect: Mood normal.        Behavior: Behavior normal.        Thought Content: Thought content normal.        Judgment: Judgment normal.     Review of Systems  Constitutional:       Covid positive but states that she is asymptomatic   HENT: Negative for sinus pressure.   Gastrointestinal: Negative for abdominal pain, rectal pain and vomiting.  Musculoskeletal: Negative for myalgias.  Psychiatric/Behavioral: Agitation: Denies. Confusion: Denies. Hallucinations: Denies. Self-injury: Denies. Sleep disturbance: Denies. Suicidal ideas: Denies. Nervous/anxious: Denies.   All other systems reviewed and are negative.   Blood pressure 122/80, pulse 94, temperature 98.6 F (37 C), temperature source Oral, resp. rate 16, SpO2 94 %.There is no height or weight on file to calculate BMI.  General Appearance: Casual  Eye Contact:  Good  Speech:  Clear and Coherent and Normal Rate  Volume:  Normal  Mood:  " Fine.  I do not feel any pain."  Appropriate  Affect:  Appropriate and Congruent  Thought Process:  Coherent, Goal Directed and Descriptions of Associations: Intact  Orientation:  Full (Time, Place, and Person)  Thought Content:  WDL and Logical  Suicidal Thoughts:  No  Homicidal Thoughts:  No  Memory:  Immediate;   Good Recent;    Good  Judgement:  Intact  Insight:  Good  Psychomotor Activity:  Normal  Concentration:  Concentration: Good and Attention Span: Good  Recall:  Good  Fund of Knowledge:  Good  Language:  Good  Akathisia:  No  Handed:  Right  AIMS (if indicated):     Assets:  Communication Skills Desire for Improvement  ADL's:  Intact  Cognition:  WNL  Sleep:        Demographic Factors:  Caucasian  Loss Factors: Financial problems/change in socioeconomic status  Historical Factors: Impulsivity  Risk Reduction Factors:   Religious beliefs about death  Continued Clinical Symptoms:  Alcohol/Substance Abuse/Dependencies Previous Psychiatric Diagnoses and Treatments  Cognitive Features That Contribute To Risk:  None    Suicide Risk:  Minimal: No identifiable suicidal ideation.  Patients presenting with no risk factors but with morbid ruminations; may be classified as minimal risk based on the severity of the depressive symptoms    Plan Of Care/Follow-up recommendations:  Activity:  As tolerated Diet:  Heart healthy    Discharge Instructions     For your behavioral health needs, you are advised to continue treatment with [facility]:       [facility]      [address]      [place], [place] [address]      [phone]      Crisis number: [phone]       Disposition: Patient psychiatrically cleared No evidence of imminent risk to self or others at present.   Patient does not meet criteria for psychiatric inpatient admission. Supportive therapy provided about ongoing stressors. Discussed crisis plan, support from social network, calling 911, coming to the Emergency Department, and calling Suicide Hotline.  Dewey Viens, NP 07/25/2019, 1:28 PM

## 2019-07-25 NOTE — Progress Notes (Signed)
CSW received a call from pt's RN stating CSW can send pt's COVID test results to the Health Dept.  CSW will continue to follow for D/C needs.  Alphonse Guild. Raveen Wieseler, LCSW, LCAS, CSI Transitions of Care Clinical Social Worker Care Coordination Department Ph: [phone]

## 2019-07-25 NOTE — ED Notes (Addendum)
Patient gave verbal consent to writer for CSW JRiffey to send her positive COVID results to Health Dept.

## 2019-08-31 ENCOUNTER — Encounter (HOSPITAL_COMMUNITY): Payer: Self-pay | Admitting: Emergency Medicine

## 2019-08-31 ENCOUNTER — Emergency Department (HOSPITAL_COMMUNITY)
Admission: EM | Admit: 2019-08-31 | Discharge: 2019-08-31 | Disposition: A | Discharge status: Home / Self Care | Payer: Medicaid Other | Attending: Emergency Medicine | Admitting: Emergency Medicine

## 2019-08-31 ENCOUNTER — Other Ambulatory Visit: Payer: Self-pay

## 2019-08-31 DIAGNOSIS — F17210 Nicotine dependence, cigarettes, uncomplicated: Secondary | ICD-10-CM | POA: Insufficient documentation

## 2019-08-31 DIAGNOSIS — Z79899 Other long term (current) drug therapy: Secondary | ICD-10-CM | POA: Insufficient documentation

## 2019-08-31 DIAGNOSIS — F339 Major depressive disorder, recurrent, unspecified: Secondary | ICD-10-CM | POA: Insufficient documentation

## 2019-08-31 DIAGNOSIS — F419 Anxiety disorder, unspecified: Secondary | ICD-10-CM

## 2019-08-31 DIAGNOSIS — N3000 Acute cystitis without hematuria: Secondary | ICD-10-CM

## 2019-08-31 LAB — COMPREHENSIVE METABOLIC PANEL
ALT: 16 U/L (ref 0–44)
AST: 15 U/L (ref 15–41)
Albumin: 4.5 g/dL (ref 3.5–5.0)
Alkaline Phosphatase: 85 U/L (ref 38–126)
Anion gap: 11 (ref 5–15)
BUN: 12 mg/dL (ref 6–20)
CO2: 25 mmol/L (ref 22–32)
Calcium: 9.4 mg/dL (ref 8.9–10.3)
Chloride: 104 mmol/L (ref 98–111)
Creatinine, Ser: 0.71 mg/dL (ref 0.44–1.00)
GFR calc Af Amer: >60 mL/min (ref >60)
GFR calc non Af Amer: >60 mL/min (ref >60)
Glucose, Bld: 86 mg/dL (ref 70–99)
Potassium: 4.1 mmol/L (ref 3.5–5.1)
Sodium: 140 mmol/L (ref 135–145)
Total Bilirubin: 0.7 mg/dL (ref 0.3–1.2)
Total Protein: 7.8 g/dL (ref 6.5–8.1)

## 2019-08-31 LAB — CBC WITH DIFFERENTIAL/PLATELET
Abs Immature Granulocytes: 0.04 10*3/uL (ref 0.00–0.07)
Basophils Absolute: 0 10*3/uL (ref 0.0–0.1)
Basophils Relative: 0 %
Eosinophils Absolute: 0.1 10*3/uL (ref 0.0–0.5)
Eosinophils Relative: 1 %
HCT: 45 % (ref 36.0–46.0)
Hemoglobin: 14.6 g/dL (ref 12.0–15.0)
Immature Granulocytes: 0 %
Lymphocytes Relative: 19 %
Lymphs Abs: 2.3 10*3/uL (ref 0.7–4.0)
MCH: 29.4 pg (ref 26.0–34.0)
MCHC: 32.4 g/dL (ref 30.0–36.0)
MCV: 90.5 fL (ref 80.0–100.0)
Monocytes Absolute: 0.7 10*3/uL (ref 0.1–1.0)
Monocytes Relative: 6 %
Neutro Abs: 9.2 10*3/uL — ABNORMAL HIGH (ref 1.7–7.7)
Neutrophils Relative %: 74 %
Platelets: 301 10*3/uL (ref 150–400)
RBC: 4.97 MIL/uL (ref 3.87–5.11)
RDW: 13.2 % (ref 11.5–15.5)
WBC: 12.5 10*3/uL — ABNORMAL HIGH (ref 4.0–10.5)
nRBC: 0 % (ref 0.0–0.2)

## 2019-08-31 LAB — URINALYSIS, ROUTINE W REFLEX MICROSCOPIC
Bilirubin Urine: NEGATIVE
Glucose, UA: NEGATIVE mg/dL
Hgb urine dipstick: NEGATIVE
Ketones, ur: 5 mg/dL — AB
Nitrite: NEGATIVE
Protein, ur: NEGATIVE mg/dL
Specific Gravity, Urine: 1.02 (ref 1.005–1.030)
pH: 5 (ref 5.0–8.0)

## 2019-08-31 LAB — RAPID URINE DRUG SCREEN, HOSP PERFORMED
Amphetamines: NOT DETECTED
Barbiturates: NOT DETECTED
Benzodiazepines: NOT DETECTED
Cocaine: NOT DETECTED
Opiates: NOT DETECTED
Tetrahydrocannabinol: NOT DETECTED

## 2019-08-31 LAB — ACETAMINOPHEN LEVEL: Acetaminophen (Tylenol), Serum: <10 ug/mL — ABNORMAL LOW (ref 10–30)

## 2019-08-31 LAB — SALICYLATE LEVEL: Salicylate Lvl: <7 mg/dL — ABNORMAL LOW (ref 7.0–30.0)

## 2019-08-31 LAB — ETHANOL: Alcohol, Ethyl (B): <10 mg/dL (ref <10)

## 2019-08-31 LAB — TSH: TSH: 1.164 u[IU]/mL (ref 0.350–4.500)

## 2019-08-31 MED ORDER — ONDANSETRON HCL 4 MG PO TABS: 4.0000 mg | ORAL_TABLET | Freq: Three times a day (TID) | ORAL | Status: DC | PRN

## 2019-08-31 MED ORDER — BUSPIRONE HCL 10 MG PO TABS
5.0000 mg | ORAL_TABLET | Freq: Three times a day (TID) | ORAL | Status: DC
  Administered 2019-08-31: 5 mg via ORAL
  Filled 2019-08-31: qty 1

## 2019-08-31 MED ORDER — BUSPIRONE HCL 5 MG PO TABS: 5.0000 mg | ORAL_TABLET | Freq: Three times a day (TID) | ORAL | 0 refills | Status: DC

## 2019-08-31 MED ORDER — LORAZEPAM 1 MG PO TABS
1.0000 mg | ORAL_TABLET | Freq: Once | ORAL | Status: AC
  Administered 2019-08-31: 1 mg via ORAL
  Filled 2019-08-31: qty 1

## 2019-08-31 MED ORDER — ESCITALOPRAM OXALATE 10 MG PO TABS
20.0000 mg | ORAL_TABLET | Freq: Every day | ORAL | Status: DC
  Administered 2019-08-31: 20 mg via ORAL
  Filled 2019-08-31: qty 2

## 2019-08-31 MED ORDER — NICOTINE 21 MG/24HR TD PT24
21.0000 mg | MEDICATED_PATCH | Freq: Every day | TRANSDERMAL | Status: DC
  Administered 2019-08-31: 21 mg via TRANSDERMAL
  Filled 2019-08-31: qty 1

## 2019-08-31 MED ORDER — SULFAMETHOXAZOLE-TRIMETHOPRIM 800-160 MG PO TABS: 1.0000 | ORAL_TABLET | Freq: Two times a day (BID) | ORAL | 0 refills | Status: AC

## 2019-08-31 MED ORDER — ALUM & MAG HYDROXIDE-SIMETH 200-200-20 MG/5ML PO SUSP: 30.0000 mL | Freq: Four times a day (QID) | ORAL | Status: DC | PRN

## 2019-08-31 MED ORDER — SULFAMETHOXAZOLE-TRIMETHOPRIM 800-160 MG PO TABS
1.0000 | ORAL_TABLET | Freq: Once | ORAL | Status: AC
  Administered 2019-08-31: 1 via ORAL
  Filled 2019-08-31: qty 1

## 2019-08-31 MED ORDER — ARIPIPRAZOLE 10 MG PO TABS
10.0000 mg | ORAL_TABLET | Freq: Every day | ORAL | Status: DC
  Administered 2019-08-31: 10 mg via ORAL
  Filled 2019-08-31: qty 1

## 2019-08-31 NOTE — BHH Suicide Risk Assessment (Cosign Needed)
Suicide Risk Assessment  Discharge Assessment   [facility] Discharge Suicide Risk Assessment   Principal Problem: <principal problem not specified> Discharge Diagnoses: Active Problems:   * No active hospital problems. *   Total Time spent with patient: 30 minutes  Musculoskeletal: Strength & Muscle Tone: within normal limits Gait & Station: normal Patient leans: N/A  Psychiatric Specialty Exam:   Blood pressure 127/77, pulse 87, temperature 98.3 F (36.8 C), temperature source Oral, resp. rate 15, SpO2 98 %.There is no height or weight on file to calculate BMI.  General Appearance: Casual  Eye Contact::  Good  Speech:  Clear and Coherent and Normal Rate409  Volume:  Normal  Mood:  Anxious  Affect:  Appropriate and Congruent  Thought Process:  Coherent, Goal Directed and Descriptions of Associations: Intact  Orientation:  Full (Time, Place, and Person)  Thought Content:  WDL  Suicidal Thoughts:  No  Homicidal Thoughts:  No  Memory:  Immediate;   Good Recent;   Good  Judgement:  Intact  Insight:  Present  Psychomotor Activity:  Normal  Concentration:  Good  Recall:  Good  Fund of Knowledge:Good  Language: Good  Akathisia:  No  Handed:  Right  AIMS (if indicated):     Assets:  Communication Skills Desire for Improvement Housing Social Support  Sleep:     Cognition: WNL  ADL's:  Intact   Mental Status Per Nursing Assessment::   On Admission:    Kristina Owens, 54 y.o., female patient seen via tele psych by this provider, Dr. Lucianne Muss; and chart reviewed on 08/31/19.  On evaluation Kristina Owens reports she came to the hospital for medication adjustment.  Reports she has outpatient psychiatric services through [facility] but has been unable to get an appointment for medication adjustment.  Patient reports worsening of anxiety since starting a new job.  "  I do not feel like my medication is working.  My depression and anxiety is getting worse.  I just started a new job and  I get really anxious and overwhelmed, and start to feel panic and this was problem my depression to get worse."  Patient states that she has been on Abilify 10 mg daily and Lexapro 20 mg daily for years.  "  I just feel like I need to be switched to a different medication."  Patient denies suicidal/self-harm/homicidal ideations, psychosis, paranoia.  Patient does not feel she needs to come into the hospital just want to get medications adjusted.  Discussed increasing patient's medication or adding BuSpar 5 mg twice daily and then follow-up with [facility].  Patient was in agreement to adding BuSpar to her medication regimen and leave in Abilify and Lexapro at current doses.  We will give patient resource information and phone numbers for [facility]. During evaluation [facility] is alert/oriented x 4; calm/cooperative; and mood is congruent with affect.  She does not appear to be responding to internal/external stimuli or delusional thoughts.  Patient denies suicidal/self-harm/homicidal ideation, psychosis, and paranoia.  Patient answered question appropriately.  Lab indicate possible urinary tract infection will speak with EDP related to treatment and discharging patient once medically cleared.       Demographic Factors:  Caucasian  Loss Factors: NA  Historical Factors: NA  Risk Reduction Factors:   Religious beliefs about death and Employed  Continued Clinical Symptoms:  Previous Psychiatric Diagnoses and Treatments  Cognitive Features That Contribute To Risk:  None    Suicide Risk:  Minimal: No identifiable suicidal ideation.  Patients presenting  with no risk factors but with morbid ruminations; may be classified as minimal risk based on the severity of the depressive symptoms   Plan Of Care/Follow-up recommendations:  Activity:  As tolerated Diet:  Heart healthy     Discharge Instructions     For your behavioral health needs, you are advised to continue treatment with  [facility]:       [facility]      [address], [place] [address]      [phone]      Crisis number: [phone]       Disposition:  Patient psychiatrically cleared No evidence of imminent risk to self or others at present.   Patient does not meet criteria for psychiatric inpatient admission. Supportive therapy provided about ongoing stressors. Discussed crisis plan, support from social network, calling 911, coming to the Emergency Department, and calling Suicide Hotline.  Schawn Byas, NP 08/31/2019, 10:34 AM   Spoke with Dr. Gilford Raid informed that patient had been psychiatrically cleared and Buspar entered on discharge orders.  Dr. Gilford Raid will treat UTI and discharge.

## 2019-08-31 NOTE — ED Triage Notes (Signed)
Patient states she needs her medications adjusted, she feels hopeless and has no drive to get out of bed in the morning. Denies SI/HI at this time, says she does not want it to progress to SI. [facility], but does not have an appt until next week. States September 2020 was the last time her meds were adjusted.
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
## 2019-08-31 NOTE — ED Provider Notes (Signed)
[facility]-EMERGENCY DEPT Provider Note   CSN: [id number] Arrival date & time: 08/31/19  0277     History Chief Complaint  Patient presents with  . medication adjustment    Kristina Owens is a 54 y.o. female.  Pt presents to the ED today with anxiety.  Pt has a hx of depression and anxiety.  She said she feels very depressed.  She has a hard time getting oob.  She is on Lexapro and Abilify. She gets her meds through [facility].  She said she called for an appointment, but can't get in until next week.  She is worried she will progress to SI, but does not want to get there.  Pt was Covid positive on 12/19, but has no sx of Covid anymore.  She's been living at the [facility] addiction treatment center with 5 other women.        Past Medical History:  Diagnosis Date  . Anxiety   . Depression   . GERD (gastroesophageal reflux disease)   . Ovarian cyst     Patient Active Problem List   Diagnosis Date Noted  . Suicidal ideation   . 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 Cocaine use disorder, severe, dependence (HCC) 07/24/2019  . History of colon polyps 12/09/2018  . 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 APPENDECTOMY  age 46  . TONSILLECTOMY Bilateral age 43     OB History   No obstetric history on file.     Family History  Problem Relation Age of Onset  . Asthma Mother   . COPD Mother   . Cancer Father        thyroid cancer    Social History   Tobacco Use  . Smoking status: Current Every Day Smoker    Packs/day: 1.00    Years: 35.00    Pack years: 35.00    Types: Cigarettes  . Smokeless tobacco: Never Used  Substance Use Topics  . Alcohol use: Not Currently    Comment: alcoholic.  none since 11/15/18  . Drug use: Not Currently    Types: "Crack" cocaine    Comment: last use was 11-15-18    Home Medications Prior to Admission medications   Medication  Sig Start Date End Date Taking? Authorizing Provider  ARIPiprazole (ABILIFY) 10 MG tablet Take 1 tablet (10 mg total) by mouth daily. 01/24/19  Yes Bethel Born, PA-C  escitalopram (LEXAPRO) 20 MG tablet Take 20 mg by mouth daily.    Yes [provider]  ibuprofen (ADVIL) 200 MG tablet Take 600 mg by mouth every 6 (six) hours as needed for fever, headache or moderate pain.   Yes [provider]  busPIRone (BUSPAR) 5 MG tablet Take 1 tablet (5 mg total) by mouth 3 (three) times daily. 08/31/19   Rankin, Shuvon B, NP  gabapentin (NEURONTIN) 100 MG capsule Take 2 capsules (200 mg total) by mouth 2 (two) times daily. Patient not taking: Reported on 08/31/2019 07/25/19   Rankin, Shuvon B, NP  sulfamethoxazole-trimethoprim (BACTRIM DS) 800-160 MG tablet Take 1 tablet by mouth 2 (two) times daily for 7 days. 08/31/19 09/07/19  Jacalyn Lefevre, MD    Allergies    Patient has no known allergies.  Review of Systems   Review of Systems  Psychiatric/Behavioral: The patient is nervous/anxious.        Depression  All other systems reviewed and are negative.  Physical Exam Updated Vital Signs BP 127/77 (BP Location: Right Arm)   Pulse 87   Temp 98.3 F (36.8 C) (Oral)   Resp 15   SpO2 98%   Physical Exam Vitals and nursing note reviewed.  Constitutional:      Appearance: Normal appearance.  HENT:     Head: Normocephalic and atraumatic.     Right Ear: External ear normal.     Left Ear: External ear normal.     Nose: Nose normal.     Mouth/Throat:     Mouth: Mucous membranes are moist.     Pharynx: Oropharynx is clear.  Eyes:     Extraocular Movements: Extraocular movements intact.     Conjunctiva/sclera: Conjunctivae normal.     Pupils: Pupils are equal, round, and reactive to light.  Cardiovascular:     Rate and Rhythm: Normal rate and regular rhythm.     Pulses: Normal pulses.     Heart sounds: Normal heart sounds.  Pulmonary:     Effort: Pulmonary effort is  normal.     Breath sounds: Normal breath sounds.  Abdominal:     General: Abdomen is flat. Bowel sounds are normal.     Palpations: Abdomen is soft.  Musculoskeletal:        General: Normal range of motion.     Cervical back: Normal range of motion and neck supple.  Skin:    General: Skin is warm.     Capillary Refill: Capillary refill takes less than 2 seconds.  Neurological:     General: No focal deficit present.     Mental Status: She is alert and oriented to person, place, and time.  Psychiatric:        Mood and Affect: Mood is anxious and depressed.        Thought Content: Thought content does not include suicidal ideation. Thought content does not include suicidal plan.     ED Results / Procedures / Treatments   Labs (all labs ordered are listed, but only abnormal results are displayed) Labs Reviewed  CBC WITH DIFFERENTIAL/PLATELET - Abnormal; Notable for the following components:      Result Value   WBC 12.5 (*)    Neutro Abs 9.2 (*)    All other components within normal limits  SALICYLATE LEVEL - Abnormal; Notable for the following components:   Salicylate Lvl <7.5 (*)    All other components within normal limits  ACETAMINOPHEN LEVEL - Abnormal; Notable for the following components:   Acetaminophen (Tylenol), Serum <10 (*)    All other components within normal limits  URINALYSIS, ROUTINE W REFLEX MICROSCOPIC - Abnormal; Notable for the following components:   Ketones, ur 5 (*)    Leukocytes,Ua LARGE (*)    Bacteria, UA RARE (*)    All other components within normal limits  COMPREHENSIVE METABOLIC PANEL  ETHANOL  RAPID URINE DRUG SCREEN, HOSP PERFORMED  TSH    EKG None  Radiology No results found.  Procedures Procedures (including critical care time)  Medications Ordered in ED Medications  ondansetron (ZOFRAN) tablet 4 mg (has no administration in time range)  alum & mag hydroxide-simeth (MAALOX/MYLANTA) 200-200-20 MG/5ML suspension 30 mL (has no  administration in time range)  nicotine (NICODERM CQ - dosed in mg/24 hours) patch 21 mg (21 mg Transdermal Patch Applied 08/31/19 0955)  ARIPiprazole (ABILIFY) tablet 10 mg (10 mg Oral Given 08/31/19 0955)  escitalopram (LEXAPRO) tablet 20 mg (20 mg Oral Given 08/31/19 0955)  busPIRone (BUSPAR) tablet 5  mg (5 mg Oral Given 08/31/19 0955)  sulfamethoxazole-trimethoprim (BACTRIM DS) 800-160 MG per tablet 1 tablet (has no administration in time range)  LORazepam (ATIVAN) tablet 1 mg (1 mg Oral Given 08/31/19 0800)    ED Course  I have reviewed the triage vital signs and the nursing notes.  Pertinent labs & imaging results that were available during my care of the patient were reviewed by me and considered in my medical decision making (see chart for details).    MDM Rules/Calculators/A&P                     Pt was d/w TTS and they will start her on Buspar.  Pt also has a UTI, so will be put on bactrim.  She is psych cleared as she does not have SI or HI.  She knows to f/u with [facility].  Return if worse.  Final Clinical Impression(s) / ED Diagnoses Final diagnoses:  Anxiety  Acute cystitis without hematuria    Rx / DC Orders ED Discharge Orders         Ordered    busPIRone (BUSPAR) 5 MG tablet  3 times daily     08/31/19 1034    sulfamethoxazole-trimethoprim (BACTRIM DS) 800-160 MG tablet  2 times daily     08/31/19 1053           Jacalyn Lefevre, MD 08/31/19 1054

## 2019-08-31 NOTE — Discharge Instructions (Addendum)
For your behavioral health needs, you are advised to continue treatment with [facility]: ° °     [facility] °     [address] °     [place], [place] [address] °     [phone] °     Crisis number: [phone] ° °

## 2019-08-31 NOTE — BH Assessment (Signed)
[facility]HH Assessment Progress Note  Per Nelly Rout, MD, this pt does not require psychiatric hospitalization at this time.  Pt is to be discharged from [facility] with recommendation to continue treatment with [facility].  This has been included in pt's discharge instructions.  Dr Lucianne Muss agrees to call new prescription in to pt's pharmacy.  Pt's nurse has been notified.  Doylene Canning, MA Triage Specialist [phone]

## 2019-09-01 ENCOUNTER — Ambulatory Visit (HOSPITAL_COMMUNITY)
Admission: RE | Admit: 2019-09-01 | Discharge: 2019-09-01 | Disposition: A | Discharge status: Home / Self Care | Payer: Self-pay | Attending: Psychiatry | Admitting: Psychiatry

## 2019-09-01 DIAGNOSIS — R45851 Suicidal ideations: Secondary | ICD-10-CM | POA: Insufficient documentation

## 2019-09-01 DIAGNOSIS — F419 Anxiety disorder, unspecified: Secondary | ICD-10-CM | POA: Insufficient documentation

## 2019-09-01 DIAGNOSIS — F331 Major depressive disorder, recurrent, moderate: Secondary | ICD-10-CM | POA: Insufficient documentation

## 2019-09-01 DIAGNOSIS — F17210 Nicotine dependence, cigarettes, uncomplicated: Secondary | ICD-10-CM | POA: Insufficient documentation
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
## 2019-09-01 NOTE — H&P (Signed)
Behavioral Health Medical Screening Exam  Kristina Owens is an 54 y.o. female.who presents to [facility] voluntarily, as a walk-in. Patient endorses depression and anxiety. She was evaluated by [facility] team yesterday after presenting to [facility] ED with similar presentation although today she reports feeling suicidal. She denies plan or intent. She reports her suicidal thoughts started 3 days ago although yesterday when psychiatrically evaluated, she denied SI. She denies HI or psychosis. Denies a history of suicide attempts or self-harming behaviors. She reprots current medications as Effexor, Abilify, and Trazodone which she states are ineffective. She reports she is followed by [facility] for medication management. Reports her next appointment with [name] is scheduled for next Tuesday. She reports recent stressful event as starting a new job. She identifies no other stressors at this time. Reports a history of alcohol abuse with last use 07/2019. Denies other substance abuse or use. Reports she is currently living in an [facility]. She denies access to firearms.   Total Time spent with patient: 15 minutes  Psychiatric Specialty Exam: Physical Exam  Vitals reviewed. Constitutional: She is oriented to person, place, and time.  Neurological: She is alert and oriented to person, place, and time.    Review of Systems  Psychiatric/Behavioral: Positive for suicidal ideas.       Depression, anxiety     There were no vitals taken for this visit.There is no height or weight on file to calculate BMI.  General Appearance: Casual  Eye Contact:  Good  Speech:  Clear and Coherent and Normal Rate  Volume:  Normal  Mood:  Anxious and Depressed  Affect:  anxious  Thought Process:  Coherent, Linear and Descriptions of Associations: Intact  Orientation:  Full (Time, Place, and Person)  Thought Content:  Logical  Suicidal Thoughts:  Yes.  without intent/plan  Homicidal Thoughts:  No  Memory:  Immediate;    Fair Recent;   Fair  Judgement:  Fair  Insight:  Fair  Psychomotor Activity:  Normal  Concentration: Concentration: Fair and Attention Span: Fair  Recall:  [employer] of Knowledge:Fair  Language: Good  Akathisia:  Negative  Handed:  Right  AIMS (if indicated):     Assets:  Communication Skills Desire for Improvement Resilience Social Support  Sleep:       Musculoskeletal: Strength & Muscle Tone: within normal limits Gait & Station: normal Patient leans: N/A  There were no vitals taken for this visit.  Recommendations:  Based on my evaluation the patient does not appear to have an emergency medical condition.  Patient psychiatrically cleared No evidence of imminent risk to self or others at present.   Patient does not meet criteria for psychiatric inpatient admission. Supportive therapy provided about ongoing stressors. Discussed crisis plan, support from social network, calling 911, coming to the Emergency Department, and calling Suicide Hotline.  Denzil Magnuson, NP 09/01/2019, 1:13 PM

## 2019-09-15 ENCOUNTER — Emergency Department (HOSPITAL_COMMUNITY)
Admission: EM | Admit: 2019-09-15 | Discharge: 2019-09-15 | Disposition: A | Discharge status: Home / Self Care | Payer: Self-pay | Attending: Emergency Medicine | Admitting: Emergency Medicine

## 2019-09-15 ENCOUNTER — Other Ambulatory Visit: Payer: Self-pay

## 2019-09-15 ENCOUNTER — Emergency Department (HOSPITAL_COMMUNITY): Payer: Self-pay

## 2019-09-15 ENCOUNTER — Encounter (HOSPITAL_COMMUNITY): Payer: Self-pay | Admitting: Emergency Medicine

## 2019-09-15 DIAGNOSIS — Z87440 Personal history of urinary (tract) infections: Secondary | ICD-10-CM | POA: Insufficient documentation

## 2019-09-15 DIAGNOSIS — Z79899 Other long term (current) drug therapy: Secondary | ICD-10-CM | POA: Insufficient documentation

## 2019-09-15 DIAGNOSIS — R109 Unspecified abdominal pain: Secondary | ICD-10-CM

## 2019-09-15 DIAGNOSIS — K219 Gastro-esophageal reflux disease without esophagitis: Secondary | ICD-10-CM | POA: Insufficient documentation

## 2019-09-15 DIAGNOSIS — F17210 Nicotine dependence, cigarettes, uncomplicated: Secondary | ICD-10-CM | POA: Insufficient documentation

## 2019-09-15 DIAGNOSIS — R1031 Right lower quadrant pain: Secondary | ICD-10-CM | POA: Insufficient documentation

## 2019-09-15 DIAGNOSIS — R110 Nausea: Secondary | ICD-10-CM | POA: Insufficient documentation

## 2019-09-15 DIAGNOSIS — F329 Major depressive disorder, single episode, unspecified: Secondary | ICD-10-CM | POA: Insufficient documentation

## 2019-09-15 LAB — BASIC METABOLIC PANEL
Anion gap: 7 (ref 5–15)
BUN: 9 mg/dL (ref 6–20)
CO2: 24 mmol/L (ref 22–32)
Calcium: 9.1 mg/dL (ref 8.9–10.3)
Chloride: 109 mmol/L (ref 98–111)
Creatinine, Ser: 0.67 mg/dL (ref 0.44–1.00)
GFR calc Af Amer: >60 mL/min (ref >60)
GFR calc non Af Amer: >60 mL/min (ref >60)
Glucose, Bld: 82 mg/dL (ref 70–99)
Potassium: 4.2 mmol/L (ref 3.5–5.1)
Sodium: 140 mmol/L (ref 135–145)

## 2019-09-15 LAB — URINALYSIS, ROUTINE W REFLEX MICROSCOPIC
Bacteria, UA: NONE SEEN
Bilirubin Urine: NEGATIVE
Glucose, UA: NEGATIVE mg/dL
Hgb urine dipstick: NEGATIVE
Ketones, ur: 5 mg/dL — AB
Nitrite: NEGATIVE
Protein, ur: NEGATIVE mg/dL
Specific Gravity, Urine: 1.024 (ref 1.005–1.030)
pH: 6 (ref 5.0–8.0)

## 2019-09-15 LAB — CBC WITH DIFFERENTIAL/PLATELET
Abs Immature Granulocytes: 0.03 10*3/uL (ref 0.00–0.07)
Basophils Absolute: 0 10*3/uL (ref 0.0–0.1)
Basophils Relative: 0 %
Eosinophils Absolute: 0.2 10*3/uL (ref 0.0–0.5)
Eosinophils Relative: 2 %
HCT: 41.6 % (ref 36.0–46.0)
Hemoglobin: 13.7 g/dL (ref 12.0–15.0)
Immature Granulocytes: 0 %
Lymphocytes Relative: 33 %
Lymphs Abs: 2.8 10*3/uL (ref 0.7–4.0)
MCH: 29.6 pg (ref 26.0–34.0)
MCHC: 32.9 g/dL (ref 30.0–36.0)
MCV: 89.8 fL (ref 80.0–100.0)
Monocytes Absolute: 0.5 10*3/uL (ref 0.1–1.0)
Monocytes Relative: 6 %
Neutro Abs: 5 10*3/uL (ref 1.7–7.7)
Neutrophils Relative %: 59 %
Platelets: 305 10*3/uL (ref 150–400)
RBC: 4.63 MIL/uL (ref 3.87–5.11)
RDW: 12.8 % (ref 11.5–15.5)
WBC: 8.6 10*3/uL (ref 4.0–10.5)
nRBC: 0 % (ref 0.0–0.2)

## 2019-09-15 MED ORDER — KETOROLAC TROMETHAMINE 30 MG/ML IJ SOLN
30.0000 mg | Freq: Once | INTRAMUSCULAR | Status: AC
  Administered 2019-09-15: 30 mg via INTRAVENOUS
  Filled 2019-09-15: qty 1

## 2019-09-15 MED ORDER — SULFAMETHOXAZOLE-TRIMETHOPRIM 800-160 MG PO TABS: 1.0000 | ORAL_TABLET | Freq: Two times a day (BID) | ORAL | 0 refills | Status: DC

## 2019-09-15 MED ORDER — ONDANSETRON HCL 4 MG/2ML IJ SOLN
4.0000 mg | Freq: Once | INTRAMUSCULAR | Status: AC
  Administered 2019-09-15: 17:00:00 4 mg via INTRAVENOUS
  Filled 2019-09-15: qty 2

## 2019-09-15 MED ORDER — SULFAMETHOXAZOLE-TRIMETHOPRIM 800-160 MG PO TABS: 1.0000 | ORAL_TABLET | Freq: Two times a day (BID) | ORAL | 0 refills | Status: AC

## 2019-09-15 MED ORDER — IOHEXOL 300 MG/ML  SOLN: 100.0000 mL | Freq: Once | INTRAMUSCULAR | Status: DC | PRN

## 2019-09-15 NOTE — ED Provider Notes (Signed)
[facility]-EMERGENCY DEPT Provider Note   CSN: [id number] Arrival date & time: 09/15/19  1436     History Chief Complaint  Patient presents with  . Flank Pain    Kristina Owens is a 54 y.o. female with PMHx GERD, anxiety, depression, and ovarian cyst who presents to the ED today complaining of sudden onset, constant, waxing and waning, right flank pain radiating into right lower abdomen x 3-4 days. Pt also complains of nausea.  Reports she was recently treated for UTI and finished her antibiotics yesterday.  Per chart review patient was seen in the ED on 1/27 for medication refill and was found to have a UTI.  Urine culture was not sent.  She was discharged home with Bactrim twice daily x7 days.  Patient states she did not pick up the antibiotics immediately and started taking them about 1 week ago, finished yesterday.  Denies fevers, chills, vomiting, diarrhea, constipation, dysuria, urinary frequency, hematuria, vaginal discharge, pelvic pain, any other associated symptoms.   The history is provided by the patient.       Past Medical History:  Diagnosis Date  . Anxiety   . Depression   . GERD (gastroesophageal reflux disease)   . Ovarian cyst     Patient Active Problem List   Diagnosis Date Noted  . Suicidal ideation   . 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 Cocaine use disorder, severe, dependence (HCC) 07/24/2019  . History of colon polyps 12/09/2018  . 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 APPENDECTOMY  age 42  . TONSILLECTOMY Bilateral age 59     OB History   No obstetric history on file.     Family History  Problem Relation Age of Onset  . Asthma Mother   . COPD Mother   . Cancer Father        thyroid cancer    Social History   Tobacco Use  . Smoking status: Current Every Day Smoker    Packs/day: 1.00    Years: 35.00    Pack years: 35.00    Types:  Cigarettes  . Smokeless tobacco: Never Used  Substance Use Topics  . Alcohol use: Not Currently    Comment: alcoholic.  none since 11/15/18  . Drug use: Not Currently    Types: "Crack" cocaine    Comment: last use was 11-15-18    Home Medications Prior to Admission medications   Medication Sig Start Date End Date Taking? Authorizing Provider  ARIPiprazole (ABILIFY) 10 MG tablet Take 1 tablet (10 mg total) by mouth daily. 01/24/19   Bethel Born, PA-C  busPIRone (BUSPAR) 5 MG tablet Take 1 tablet (5 mg total) by mouth 3 (three) times daily. 08/31/19   Rankin, Shuvon B, NP  escitalopram (LEXAPRO) 20 MG tablet Take 20 mg by mouth daily.     [provider]  gabapentin (NEURONTIN) 100 MG capsule Take 2 capsules (200 mg total) by mouth 2 (two) times daily. Patient not taking: Reported on 08/31/2019 07/25/19   Rankin, Shuvon B, NP  ibuprofen (ADVIL) 200 MG tablet Take 600 mg by mouth every 6 (six) hours as needed for fever, headache or moderate pain.    [provider]  sulfamethoxazole-trimethoprim (BACTRIM DS) 800-160 MG tablet Take 1 tablet by mouth 2 (two) times daily for 7 days. 09/15/19 09/22/19  Tanda Rockers, PA-C    Allergies    Patient has no known allergies.  Review of Systems   Review of Systems  Constitutional: Negative for chills and fever.  Gastrointestinal: Positive for abdominal pain and nausea. Negative for constipation, diarrhea and vomiting.  Genitourinary: Positive for flank pain. Negative for dysuria, pelvic pain and vaginal bleeding.  All other systems reviewed and are negative.   Physical Exam Updated Vital Signs BP 113/64   Pulse 76   Temp 98.2 F (36.8 C)   Resp 19   SpO2 99%   Physical Exam Vitals and nursing note reviewed.  Constitutional:      Appearance: She is not ill-appearing or diaphoretic.  HENT:     Head: Normocephalic and atraumatic.  Eyes:     Conjunctiva/sclera: Conjunctivae normal.  Cardiovascular:     Rate and  Rhythm: Normal rate and regular rhythm.     Pulses: Normal pulses.  Pulmonary:     Effort: Pulmonary effort is normal.     Breath sounds: Normal breath sounds. No wheezing, rhonchi or rales.  Abdominal:     Palpations: Abdomen is soft.     Tenderness: There is abdominal tenderness. There is right CVA tenderness. There is no left CVA tenderness, guarding or rebound.     Comments: Soft, + right CVA tenderness with tenderness to RLQ, +BS throughout, no r/g/r, neg murphy's, neg mcburney's  Musculoskeletal:     Cervical back: Neck supple.  Skin:    General: Skin is warm and dry.  Neurological:     Mental Status: She is alert.     ED Results / Procedures / Treatments   Labs (all labs ordered are listed, but only abnormal results are displayed) Labs Reviewed  URINALYSIS, ROUTINE W REFLEX MICROSCOPIC - Abnormal; Notable for the following components:      Result Value   APPearance HAZY (*)    Ketones, ur 5 (*)    Leukocytes,Ua TRACE (*)    All other components within normal limits  URINE CULTURE  BASIC METABOLIC PANEL  CBC WITH DIFFERENTIAL/PLATELET    EKG None  Radiology No results found.  Procedures Procedures (including critical care time)  Medications Ordered in ED Medications  iohexol (OMNIPAQUE) 300 MG/ML solution 100 mL (has no administration in time range)  ondansetron (ZOFRAN) injection 4 mg (4 mg Intravenous Given 09/15/19 1633)  ketorolac (TORADOL) 30 MG/ML injection 30 mg (30 mg Intravenous Given 09/15/19 1633)    ED Course  I have reviewed the triage vital signs and the nursing notes.  Pertinent labs & imaging results that were available during my care of the patient were reviewed by me and considered in my medical decision making (see chart for details).  54 year old female who presents to the ED today complaining of right flank pain that started 3 to 4 days ago with radiation into right lower quadrant and nausea.  History of appendectomy, no concern for  appendicitis at this time.  She does report she was recently diagnosed with a UTI on 1/27 during ED visit for med refills.  Did not start taking antibiotics until about a week ago and finished her Bactrim yesterday.  On [place] to the ED patient is afebrile, nontachycardic and tachypneic.  She does not appear to be in any acute distress.  Sitting comfortably in bed.  She does have right CVA tenderness with radiation into right lower quadrant.  No peritoneal signs.  There is some concern for Pilo at this time given recent UTI with new onset flank pain.  Considering kidney stones.  Will obtain urinalysis at this time, screening  labs, Toradol and Zofran for symptomatic relief.  Consider imaging.   U/A hazy in appearance with trace leuks however 11-20 squamous epithelium. Will send for culture.   Labwork reassuring.  CBC without leukocytosis. Hgb stable.  BMP without electrolyte abnormalities.   Had discussed obtaining imaging to look for pyelo however pt declines at this time; she needed to leave the ED as her only ride was ready to take her home. I will cover her with another 7 days of Bactrim to fully cover for potential pyelo. Pt advised to take Ibuprofen and Tylenol PRN for pain. Advised PCP follow up in 1-2 weeks. Strict return precautions discussed with pt. She is in agreement with plan and stable for discharge home.   This note was prepared using Dragon voice recognition software and may include unintentional dictation errors due to the inherent limitations of voice recognition software.    MDM Rules/Calculators/A&P                       Final Clinical Impression(s) / ED Diagnoses Final diagnoses:  Right flank pain    Rx / DC Orders ED Discharge Orders         Ordered    sulfamethoxazole-trimethoprim (BACTRIM DS) 800-160 MG tablet  2 times daily,   Status:  Discontinued     09/15/19 1926    sulfamethoxazole-trimethoprim (BACTRIM DS) 800-160 MG tablet  2 times daily     09/15/19 1929             Discharge Instructions     Please pick up antibiotics and take as prescribed for the next 7 days.  Follow up with your PCP. If you do not have one you can follow up with [facility] for your primary care needs.  Take Ibuprofen and Tylenol as needed for the pain Return to the ED for any worsening symptoms including worsening pain, fevers > 100.4, chills, vomiting.        Tanda Rockers, PA-C 09/15/19 1946    Donnetta Hutching, MD 09/15/19 2018

## 2019-09-15 NOTE — ED Triage Notes (Signed)
Patient c/o right side flank pain worsening x3 days. Reports nausea. Denies V/D and urinary sx.

## 2019-09-15 NOTE — ED Notes (Signed)
Pt does not want CT scan. Pt stated that she does not think she needs it because her labs are "perfect". PA Margaux made aware.

## 2019-09-15 NOTE — Discharge Instructions (Addendum)
Please pick up antibiotics and take as prescribed for the next 7 days.  Follow up with your PCP. If you do not have one you can follow up with [facility] for your primary care needs.  Take Ibuprofen and Tylenol as needed for the pain Return to the ED for any worsening symptoms including worsening pain, fevers > 100.4, chills, vomiting.

## 2019-09-17 LAB — URINE CULTURE

## 2019-10-03 DIAGNOSIS — F4310 Post-traumatic stress disorder, unspecified: Secondary | ICD-10-CM | POA: Insufficient documentation

## 2019-10-03 DIAGNOSIS — F17200 Nicotine dependence, unspecified, uncomplicated: Secondary | ICD-10-CM | POA: Insufficient documentation

## 2019-11-16 ENCOUNTER — Emergency Department (HOSPITAL_COMMUNITY)
Admission: EM | Admit: 2019-11-16 | Discharge: 2019-11-16 | Disposition: A | Discharge status: Home / Self Care | Payer: Medicaid Other | Attending: Emergency Medicine | Admitting: Emergency Medicine

## 2019-11-16 ENCOUNTER — Encounter (HOSPITAL_COMMUNITY): Payer: Self-pay

## 2019-11-16 ENCOUNTER — Other Ambulatory Visit: Payer: Self-pay

## 2019-11-16 DIAGNOSIS — F10230 Alcohol dependence with withdrawal, uncomplicated: Secondary | ICD-10-CM

## 2019-11-16 DIAGNOSIS — F1910 Other psychoactive substance abuse, uncomplicated: Secondary | ICD-10-CM | POA: Insufficient documentation

## 2019-11-16 DIAGNOSIS — R519 Headache, unspecified: Secondary | ICD-10-CM | POA: Insufficient documentation

## 2019-11-16 DIAGNOSIS — F10930 Alcohol use, unspecified with withdrawal, uncomplicated: Secondary | ICD-10-CM

## 2019-11-16 DIAGNOSIS — R110 Nausea: Secondary | ICD-10-CM | POA: Insufficient documentation

## 2019-11-16 DIAGNOSIS — F17210 Nicotine dependence, cigarettes, uncomplicated: Secondary | ICD-10-CM | POA: Insufficient documentation

## 2019-11-16 DIAGNOSIS — F10139 Alcohol abuse with withdrawal, unspecified: Secondary | ICD-10-CM | POA: Insufficient documentation

## 2019-11-16 MED ORDER — SODIUM CHLORIDE 0.9 % IV BOLUS
1000.0000 mL | Freq: Once | INTRAVENOUS | Status: AC
  Administered 2019-11-16: 1000 mL via INTRAVENOUS

## 2019-11-16 MED ORDER — LORAZEPAM 2 MG/ML IJ SOLN
1.0000 mg | Freq: Once | INTRAMUSCULAR | Status: AC
  Administered 2019-11-16: 1 mg via INTRAVENOUS
  Filled 2019-11-16: qty 1

## 2019-11-16 MED ORDER — CHLORDIAZEPOXIDE HCL 25 MG PO CAPS: ORAL_CAPSULE | ORAL | 0 refills | Status: DC

## 2019-11-16 MED ORDER — ONDANSETRON HCL 4 MG/2ML IJ SOLN
4.0000 mg | Freq: Once | INTRAMUSCULAR | Status: AC
  Administered 2019-11-16: 4 mg via INTRAVENOUS
  Filled 2019-11-16: qty 2

## 2019-11-16 NOTE — Discharge Instructions (Signed)
Please be sure to proceed directly to your treatment center.  Return here for concerning changes in your condition.

## 2019-11-16 NOTE — ED Provider Notes (Signed)
[facility] DEPT Provider Note   CSN: [id number] Arrival date & time: 11/16/19  8546     History No chief complaint on file.   Kristina Owens is a 54 y.o. female.  HPI    Patient presents with concern of alcohol withdrawal, nausea, headache. Patient knowledges a history of alcoholism, notes that over the past 3 weeks after relapsing she has been drinking alcohol, about 1/5/day, and using cocaine.  She currently complains of headache, nausea, but has no vomiting, no seizures. She notes that she does have anticipated admission to treatment center, but with concern for headache, nausea, as above she presents for evaluation. She denies other focal pain, weakness.  Is unclear if she is taking any medication or needed there are any clear alleviating or exacerbating factors. She was seemingly in a treatment program until 3 weeks ago when she relapsed.  Past Medical History:  Diagnosis Date  . Anxiety   . Depression   . GERD (gastroesophageal reflux disease)   . Ovarian cyst     Patient Active Problem List   Diagnosis Date Noted  . Suicidal ideation   . 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 Cocaine use disorder, severe, dependence ([place]) 07/24/2019  . History of colon polyps 12/09/2018  . Substance abuse in remission ([place]) 12/09/2018    Past Surgical History:  Procedure Laterality Date  . APPENDECTOMY  age 48  . TONSILLECTOMY Bilateral age 41     OB History   No obstetric history on file.     Family History  Problem Relation Age of Onset  . Asthma Mother   . COPD Mother   . Cancer Father        thyroid cancer    Social History   Tobacco Use  . Smoking status: Current Every Day Smoker    Packs/day: 0.50    Years: 35.00    Pack years: 17.50    Types: Cigarettes  . Smokeless tobacco: Never Used  Substance Use Topics  . Alcohol use: Yes  . Drug use: Yes    Types: "Crack" cocaine, Cocaine    Comment: last used  11/1319    Home Medications Prior to Admission medications   Medication Sig Start Date End Date Taking? Authorizing Provider  ARIPiprazole (ABILIFY) 10 MG tablet Take 1 tablet (10 mg total) by mouth daily. 01/24/19   Recardo Evangelist, PA-C  busPIRone (BUSPAR) 5 MG tablet Take 1 tablet (5 mg total) by mouth 3 (three) times daily. 08/31/19   Rankin, Shuvon B, NP  escitalopram (LEXAPRO) 20 MG tablet Take 20 mg by mouth daily.     [provider]  gabapentin (NEURONTIN) 100 MG capsule Take 2 capsules (200 mg total) by mouth 2 (two) times daily. Patient not taking: Reported on 08/31/2019 07/25/19   Rankin, Shuvon B, NP  ibuprofen (ADVIL) 200 MG tablet Take 600 mg by mouth every 6 (six) hours as needed for fever, headache or moderate pain.    [provider]    Allergies    Patient has no known allergies.  Review of Systems   Review of Systems  Constitutional:       Per HPI, otherwise negative  HENT:       Per HPI, otherwise negative  Respiratory:       Per HPI, otherwise negative  Cardiovascular:       Per HPI, otherwise negative  Gastrointestinal: Positive for nausea. Negative for abdominal pain and vomiting.  Endocrine:  Negative aside from HPI  Genitourinary:       Neg aside from HPI   Musculoskeletal:       Per HPI, otherwise negative  Skin: Negative.   Neurological: Negative for syncope.  Psychiatric/Behavioral:       Per HPI, polysubstance abuse    Physical Exam Updated Vital Signs BP 128/77   Pulse 73   Temp 97.7 F (36.5 C) (Oral)   Resp 18   Ht 5\' 3"  (1.6 m)   Wt 65.8 kg   SpO2 96%   BMI 25.69 kg/m   Physical Exam Vitals and nursing note reviewed.  Constitutional:      General: She is not in acute distress.    Appearance: She is well-developed.  HENT:     Head: Normocephalic and atraumatic.  Eyes:     Conjunctiva/sclera: Conjunctivae normal.  Cardiovascular:     Rate and Rhythm: Normal rate and regular rhythm.  Pulmonary:      Effort: Pulmonary effort is normal. No respiratory distress.     Breath sounds: Normal breath sounds. No stridor.  Abdominal:     General: There is no distension.  Skin:    General: Skin is warm and dry.  Neurological:     Mental Status: She is alert and oriented to person, place, and time.     Cranial Nerves: No cranial nerve deficit.     ED Results / Procedures / Treatments   Chart reviewed after the initial evaluation, notable for documentation of recent treatment program for alcohol abuse. Procedures Procedures (including critical care time)  Medications Ordered in ED Medications  sodium chloride 0.9 % bolus 1,000 mL (has no administration in time range)  ondansetron (ZOFRAN) injection 4 mg (has no administration in time range)  LORazepam (ATIVAN) injection 1 mg (has no administration in time range)    ED Course  I have reviewed the triage vital signs and the nursing notes.  Pertinent labs & imaging results that were available during my care of the patient were reviewed by me and considered in my medical decision making (see chart for details).     After the initial evaluation with consideration of symptomatic alcohol withdrawal the patient received fluids, Ativan, Zofran.    Update:, Patient with no seizure activity, no tachycardia, no evidence for complicated withdrawal.  Patient appropriate for discharge with ongoing Librium therapy to proceed to alcohol rehabilitation directly.  Final Clinical Impression(s) / ED Diagnoses Final diagnoses:  Alcohol withdrawal syndrome without complication (HCC)  Polysubstance abuse (HCC)    Rx / DC Orders ED Discharge Orders         Ordered    chlordiazePOXIDE (LIBRIUM) 25 MG capsule     11/16/19 1007           11/18/19, MD 11/16/19 1113

## 2019-11-16 NOTE — ED Triage Notes (Signed)
Patient states she has been trying to detox herself from alcohol and states her last drink was at 2200 in which she had a fifth of Vodka.  Patient c/o nausea,  and tremors.  Patient states she has been drinking a fifth of Vodka daily x 3 weeks. Patient also reports that she has been using cocaine intermittently for the past 3 weeks.  Patient also reports that she was at the [facility] for relapsed 3 weeks when she relapsed.  Patient also denies SI/HI, visual or auditory hallucinations.

## 2019-11-17 ENCOUNTER — Encounter (HOSPITAL_COMMUNITY): Payer: Self-pay

## 2019-11-17 ENCOUNTER — Emergency Department (HOSPITAL_COMMUNITY)
Admission: EM | Admit: 2019-11-17 | Discharge: 2019-11-17 | Disposition: A | Discharge status: Home / Self Care | Payer: Medicaid Other | Attending: Emergency Medicine | Admitting: Emergency Medicine

## 2019-11-17 ENCOUNTER — Other Ambulatory Visit: Payer: Self-pay

## 2019-11-17 DIAGNOSIS — Z79899 Other long term (current) drug therapy: Secondary | ICD-10-CM | POA: Insufficient documentation

## 2019-11-17 DIAGNOSIS — F10230 Alcohol dependence with withdrawal, uncomplicated: Secondary | ICD-10-CM | POA: Insufficient documentation

## 2019-11-17 DIAGNOSIS — F17210 Nicotine dependence, cigarettes, uncomplicated: Secondary | ICD-10-CM | POA: Insufficient documentation

## 2019-11-17 DIAGNOSIS — F10930 Alcohol use, unspecified with withdrawal, uncomplicated: Secondary | ICD-10-CM

## 2019-11-17 MED ORDER — CHLORDIAZEPOXIDE HCL 25 MG PO CAPS
25.0000 mg | ORAL_CAPSULE | Freq: Once | ORAL | Status: AC
  Administered 2019-11-17: 25 mg via ORAL
  Filled 2019-11-17: qty 1

## 2019-11-17 MED ORDER — ONDANSETRON 4 MG PO TBDP
4.0000 mg | ORAL_TABLET | Freq: Once | ORAL | Status: AC
  Administered 2019-11-17: 4 mg via ORAL
  Filled 2019-11-17: qty 1

## 2019-11-17 MED ORDER — PROMETHAZINE HCL 25 MG RE SUPP: 25.0000 mg | Freq: Four times a day (QID) | RECTAL | 0 refills | Status: DC | PRN

## 2019-11-17 NOTE — ED Notes (Signed)
ED Provider at bedside. 

## 2019-11-17 NOTE — ED Triage Notes (Addendum)
Patient reports she is detoxing from alcohol and cocaine.   Patient last used 2 night ago.   Patient treated for same here yesterday and sent home with prescriptions. Patient reports she did not get prescription filled and went home and slept yesterday.    A/Ox4 Ambulatory in triage.   C/o nausea, headache, and tremors.   C/o depression.

## 2019-11-17 NOTE — Discharge Instructions (Signed)
Substance Abuse Treatment Programs ° °Intensive Outpatient Programs °[facility]     °[address]      °[place], [place]                   °[phone]      ° °[facility] °[address] °[place], [place] °[phone] ° °[facility]     °(Inpatient and outpatient)     °[address]           °[phone]   ° °[facility] °[phone] (Suboxone and Methadone) ° °[address]      °[place], [place] [address]      °[phone]      ° °[address] °[place], [place] °852-3033 ° °[facility] (Outpatient/Inpatient, [facility])    °(insurance only) [phone]      °       °[facility] (Groups & Residential) °[place], [place] °[phone] ° °   °[facility]     °[address]     °[place], [place]      °[phone]      ° °[facility] (for caregivers and family) °[address] °[place], [place] °[phone] ° ° ° ° ° °Residential Treatment Programs °[facility]      °[address], [place], [place] [address]  °[phone]      ° °[facility] °[address] [place], [place] [address] °[phone] ° °[facility]        °[phone]      ° °[facility] °[phone] ° °[facility]             °[address]                                         °[place], [place]                                                °[phone] or [phone]                              ° °[facility] [place] °[address] °[place], [address] °[phone] ° °[facility]    °[address]      °[place], [place]     °[phone]      ° °[facility] °[address] °[place], [place] °[phone] ° °[name]   °[address]     °[place], [place] [address]     °[phone]      °Admissions: 8am-3pm M-F ° °[facility] °[address] °,  [place] °336-227-7417 ° °[facility]: Residential Program (90 Days)   °[place], [place]      °[phone] or [phone]    ° °[facility] °[place], [place] °(Walk in Hours over the weekend or by referral) ° °[facility] °[address], [place],  [address] °[phone] ° °Crisis Mobile: [facility]:  1-877-626-1772 (for crisis response 24 hours a day) °[facility] Hotline:      1-800-256-2452 °Outpatient Psychiatry and Counseling ° °[facility]: Mobile Crisis   Management 24 hours:  1-877-626-1772 ° °[facility] sliding scale fee and walk in schedule: M-F 8am-12pm/1pm-3pm °[address]  °[place], [place] [address] °[phone] ° °[facility] °[address] °[place], [place] [address] °[phone] ° °[facility] ([facility])- new patient walk-in appointments available Monday - Friday 8am -3pm.          °[address] °[place], [place] [address] °336-676-6840 or crisis line- 336-676-6905 ° °[facility] Intensive Outpatient Therapy Program °[address] °[place], [place] [address] °336-832-9804 ° °[facility]                  °Crisis Services      °[phone]      °[address]     °[place], [place] [address]                ° °[facility]   °[facility] °[phone] °[address] °[place], [place] [address] ° ° °[facility]          °[address]  °[place], [place] [address]       °[phone] ° °[facility] °[address] °[place], [place] [address] °336-292-1510 ° °[facility]    °[address]    °[place], [place] [address]     °[phone]      ° °Parish McKinney, MD     °[address]     °[place] [address]     °336-282-1251     °  °[facility] °[address] °[place] [address] ° °[facility]     °[address]     °[place], [place]      °336-542-2076      ° °[facility] °Shamsher Ahluwalia, MD °[address] °[place], [place] [address] °[phone] ° °[facility]     °[address]     °[place], [place] [address]     °336-274-1237      ° °[facility] °[address] °[place], [place] [address] °336-854-4450 °Resources for Temporary Residential Assistance/Crisis Centers ° °DAY CENTERS °[facility] °M-F 8am-3pm   °[address] [place], [place] [address]   336-332-0824 °Services include: laundry, barbering, support groups, case management, phone  & computer access, showers, AA/NA mtgs, mental health/substance abuse nurse, job skills class, disability information, VA assistance, spiritual classes, etc.  ° °HOMELESS SHELTERS ° °[facility]     °[facility]   °[address], [place]     °[phone]       °       °[facility] (women and children)       °[address] °[place], [place] [address] °336-275-0820 °[email or web address] for application and process °Application Required ° °[facility]   °[address]    °[place] [address]     °[phone]       °             °[facility] °[address] °[place],  [address] °336.273.5572 °336-235-0363(schedule application appt.) °Application Required ° °[facility] (women only)    °[address]     °[place],  [address]     °336-884-1039      °  Intake starts 6pm daily °Need valid ID, SSC, & Police report °[facility] [place] °[address] °[place], [place] °336-881-5420 °Application Required ° °[facility] (men only)     °[address]      °[place], [place]     °[phone]      ° °[facility] °(Pregnant women only) °[address] °[place], [place] °336-275-0206 ° °[facility]      °[address]      °[place], [place] [address]     °[phone]      °       °[facility] °[address] °[place], [place] °336-723-1848 °90 day commitment/SA/Application process ° °[facility](men only)     °[address]     °[place], [place]     °[phone]       °Check-in at 7pm     °       °[facility] °[address] °[place], [place] [address] °336-248-6684 °Men/Women/Women and Children must be there by 7 pm ° °[facility] °[place], [place] °336-722-8721                ° °

## 2019-11-18 DIAGNOSIS — F1020 Alcohol dependence, uncomplicated: Secondary | ICD-10-CM | POA: Diagnosis present

## 2019-11-18 NOTE — ED Provider Notes (Signed)
[facility] DEPT Provider Note   CSN: [id number] Arrival date & time: 11/17/19  1105     History Chief Complaint  Patient presents with  . Withdrawal    Kristina Owens is a 54 y.o. female.  HPI     54 year old female comes in a chief complaint of withdrawal.  Patient has history of depression, anxiety and alcoholism.  She reports that she has not had any drinks in the last 2 nights.  She was seen in the ER for same complaint yesterday.  Patient has not taken her prescribed medications.  She is feeling restless and having some nausea, headaches and tremors.  She also admits to using cocaine which was also last used 2 or 3 days ago.  No suicidal or homicidal ideations.  Past Medical History:  Diagnosis Date  . Anxiety   . Depression   . GERD (gastroesophageal reflux disease)   . Ovarian cyst     Patient Active Problem List   Diagnosis Date Noted  . Suicidal ideation   . 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 Cocaine use disorder, severe, dependence ([place]) 07/24/2019  . History of colon polyps 12/09/2018  . Substance abuse in remission ([place]) 12/09/2018    Past Surgical History:  Procedure Laterality Date  . APPENDECTOMY  age 39  . TONSILLECTOMY Bilateral age 68     OB History   No obstetric history on file.     Family History  Problem Relation Age of Onset  . Asthma Mother   . COPD Mother   . Cancer Father        thyroid cancer    Social History   Tobacco Use  . Smoking status: Current Every Day Smoker    Packs/day: 0.50    Years: 35.00    Pack years: 17.50    Types: Cigarettes  . Smokeless tobacco: Never Used  Substance Use Topics  . Alcohol use: Yes  . Drug use: Yes    Types: "Crack" cocaine, Cocaine    Comment: last used 11/1319    Home Medications Prior to Admission medications   Medication Sig Start Date End Date Taking? Authorizing Provider  ARIPiprazole (ABILIFY) 10 MG tablet Take 1 tablet (10  mg total) by mouth daily. 01/24/19   Recardo Evangelist, PA-C  busPIRone (BUSPAR) 5 MG tablet Take 1 tablet (5 mg total) by mouth 3 (three) times daily. 08/31/19   Rankin, Shuvon B, NP  chlordiazePOXIDE (LIBRIUM) 25 MG capsule 50mg  PO TID x 1D, then 25-50mg  PO BID X 1D, then 25-50mg  PO QD X 1D 11/16/19   Carmin Muskrat, MD  escitalopram (LEXAPRO) 20 MG tablet Take 20 mg by mouth daily.     [provider]  gabapentin (NEURONTIN) 100 MG capsule Take 2 capsules (200 mg total) by mouth 2 (two) times daily. Patient not taking: Reported on 08/31/2019 07/25/19   Rankin, Shuvon B, NP  ibuprofen (ADVIL) 200 MG tablet Take 600 mg by mouth every 6 (six) hours as needed for fever, headache or moderate pain.    [provider]  promethazine (PHENERGAN) 25 MG suppository Place 1 suppository (25 mg total) rectally every 6 (six) hours as needed for nausea or vomiting. 11/17/19   Varney Biles, MD    Allergies    Patient has no known allergies.  Review of Systems   Review of Systems  Constitutional: Positive for activity change.  Respiratory: Negative for shortness of breath.   Gastrointestinal: Positive for nausea and vomiting.  Neurological: Positive for light-headedness and headaches.  Hematological: Does not bruise/bleed easily.  All other systems reviewed and are negative.   Physical Exam Updated Vital Signs BP (!) 127/91   Pulse 81   Temp 97.8 F (36.6 C) (Oral)   Resp 14   Ht 5\' 3"  (1.6 m)   Wt 65.8 kg   SpO2 98%   BMI 25.69 kg/m   Physical Exam Vitals and nursing note reviewed.  Constitutional:      Appearance: She is well-developed.  HENT:     Head: Normocephalic and atraumatic.  Cardiovascular:     Rate and Rhythm: Normal rate.  Pulmonary:     Effort: Pulmonary effort is normal.  Abdominal:     General: Bowel sounds are normal.  Musculoskeletal:     Cervical back: Normal range of motion and neck supple.  Skin:    General: Skin is warm and dry.    Neurological:     Mental Status: She is alert and oriented to person, place, and time.  Psychiatric:        Mood and Affect: Mood normal.     ED Results / Procedures / Treatments   Labs (all labs ordered are listed, but only abnormal results are displayed) Labs Reviewed - No data to display  EKG None  Radiology No results found.  Procedures Procedures (including critical care time)  Medications Ordered in ED Medications  chlordiazePOXIDE (LIBRIUM) capsule 25 mg (25 mg Oral Given 11/17/19 1229)  ondansetron (ZOFRAN-ODT) disintegrating tablet 4 mg (4 mg Oral Given 11/17/19 1229)    ED Course  I have reviewed the triage vital signs and the nursing notes.  Pertinent labs & imaging results that were available during my care of the patient were reviewed by me and considered in my medical decision making (see chart for details).    MDM Rules/Calculators/A&P                      54 year old female comes in a chief complaint of withdrawals.  She was seen yesterday for the same complaint.  She has history of alcoholism and cocaine use.  Last use for both was 2 or 3 days ago.  She has no autonomic instability, hallucinations, tactile stimulation and is not in DTs.  She has mild tremors.  She is responding to all the queries properly.  We will give her resources for rehab and also first dose of Librium now.  She was prescribed Librium yesterday which have advised her to pick up.  Final Clinical Impression(s) / ED Diagnoses Final diagnoses:  Alcohol withdrawal syndrome without complication (HCC)    Rx / DC Orders ED Discharge Orders         Ordered    promethazine (PHENERGAN) 25 MG suppository  Every 6 hours PRN,   Status:  Discontinued     11/17/19 1221    promethazine (PHENERGAN) 25 MG suppository  Every 6 hours PRN     11/17/19 1221           11/19/19, MD 11/18/19 2303

## 2019-12-05 ENCOUNTER — Ambulatory Visit: Payer: Self-pay | Admitting: Physician Assistant

## 2020-01-18 ENCOUNTER — Other Ambulatory Visit: Payer: Self-pay

## 2020-01-18 ENCOUNTER — Ambulatory Visit (INDEPENDENT_AMBULATORY_CARE_PROVIDER_SITE_OTHER): Payer: No Payment, Other | Admitting: Psychiatry

## 2020-01-18 ENCOUNTER — Encounter (HOSPITAL_COMMUNITY): Payer: Self-pay | Admitting: Psychiatry

## 2020-01-18 DIAGNOSIS — F1020 Alcohol dependence, uncomplicated: Secondary | ICD-10-CM | POA: Insufficient documentation

## 2020-01-18 DIAGNOSIS — F3176 Bipolar disorder, in full remission, most recent episode depressed: Secondary | ICD-10-CM | POA: Diagnosis not present

## 2020-01-18 DIAGNOSIS — F411 Generalized anxiety disorder: Secondary | ICD-10-CM | POA: Insufficient documentation

## 2020-01-18 DIAGNOSIS — F1421 Cocaine dependence, in remission: Secondary | ICD-10-CM | POA: Diagnosis not present

## 2020-01-18 DIAGNOSIS — F1021 Alcohol dependence, in remission: Secondary | ICD-10-CM | POA: Insufficient documentation

## 2020-01-18 MED ORDER — SERTRALINE HCL 50 MG PO TABS: 50.0000 mg | ORAL_TABLET | Freq: Every day | ORAL | 1 refills | Status: DC

## 2020-01-18 MED ORDER — ARIPIPRAZOLE 10 MG PO TABS: 10.0000 mg | ORAL_TABLET | Freq: Every day | ORAL | 1 refills | Status: DC

## 2020-01-18 MED ORDER — LAMOTRIGINE 100 MG PO TABS: 100.0000 mg | ORAL_TABLET | Freq: Two times a day (BID) | ORAL | 1 refills | Status: DC

## 2020-01-18 MED ORDER — BUSPIRONE HCL 10 MG PO TABS: 10.0000 mg | ORAL_TABLET | Freq: Three times a day (TID) | ORAL | 1 refills | Status: DC

## 2020-01-18 NOTE — Progress Notes (Signed)
Psychiatric Initial Adult Assessment   Patient Identification: Kristina Owens MRN:  [id number] Date of Evaluation:  01/18/2020   Referral Source: [name]  Chief Complaint:   " I am doing better now but still have anxiety and intrusive thoughts."  Visit Diagnosis:    ICD-10-CM   1. Bipolar disorder, in full remission, most recent episode depressed (HCC)  F31.76 ARIPiprazole (ABILIFY) 10 MG tablet    sertraline (ZOLOFT) 50 MG tablet    lamoTRIgine (LAMICTAL) 100 MG tablet  2. Generalized anxiety disorder  F41.1 sertraline (ZOLOFT) 50 MG tablet    busPIRone (BUSPAR) 10 MG tablet  3. Alcohol use disorder, severe, in early remission, dependence (HCC)  F10.21   4. Cocaine use disorder, severe, in early remission (HCC)  F14.21     History of Present Illness: This is a 54 year old female with history of MDD versus bipolar disorder, anxiety disorder, alcohol and cocaine use disorder severe now seen for establishing care after being referred here at [facility]. Patient reported that she recently got out of the rehabilitation program at [facility].  Patient stated that she is currently living in a residential [facility] in [facility].  She stated that she has been sober for 60 days straight now.  She is very proud of herself and is glad that she is living in this facility where they have other female clients living with her as well.  She stated that she is getting  Therapy services from [facility] for another 2 months and she would need to see a new therapist following that.  Patient reported that she has a long history of being addicted to alcohol and cocaine.  She stated that she has been on them for many years now that she does not even know if she has any mental illness or not.  She reported a long history of depressive episodes wherein she would feel sad and tearful mood with low energy levels.  She has had suicidal ideations in the past.  She has been to various emergency rooms  and other psychiatry facilities numerous times in the past. She also reported having mood swings and irritability with labile mood in the past.  Also reported feeling very anxious most of the time.  She stated that she has these recurring intrusive thoughts where and she is always thinking of the worst case scenario.  She gets worked up easily due to trivial stressors and is unable to relax at the end of the day.  She feels on the edge and keyed up all the time.  She stated that she has racing thoughts due to her anxiety.  She also reported frequent checking behaviors wherein she will check the locks of the doors repeatedly, will check if she is turned off her coffee pot or curling iron or not. She denied frequent handwashing or any hoarding behaviors.  She stated that she is currently stabilized on a regimen of Abilify 10 mg, Lamictal 100 mg twice daily, buspirone 10 mg 3 times daily.  She stated that Abilify has been very helpful in management of her symptoms.  She reported she was on Lexapro and Abilify combination for many years however it stopped being effective and eventually she was placed on Lamictal after Lexapro was discontinued.  She feels that the combination of Abilify and Lamictal has helped her intrusive thoughts and anxiety.  She still feels anxious most of the times and wants to know if she can try something else that will  help her with the anxiety.  She was asked if she has ever taken Zoloft in the past.  She reported that she has never taken Zoloft in the past but has heard about it.  She has taken Prozac for a short time span is not certain if it was helpful or not.  Patient was recommended to continue the combination of Abilify, Lamictal and BuSpar as it seems to be helping and add sertraline to target the anxiety symptoms intrusive thoughts.  Patient was agreeable with this plan. Potential side effects of medication and risks vs benefits of treatment vs non-treatment were explained and  discussed. All questions were answered.    Past Psychiatric History: MDD versus bipolar disorder, alcohol, cocaine use disorder severe  Previous Psychotropic Medications: Yes   Substance Abuse History in the last 12 months:  Yes.    Consequences of Substance Abuse: Medical Consequences:  Needed medical detoxification Blackouts:  yes Withdrawal Symptoms:   Cramps Diaphoresis Diarrhea Headaches Nausea Tremors Vomiting  Past Medical History:  Past Medical History:  Diagnosis Date  . Anxiety   . Depression   . GERD (gastroesophageal reflux disease)   . Ovarian cyst     Past Surgical History:  Procedure Laterality Date  . APPENDECTOMY  age 81  . TONSILLECTOMY Bilateral age 50    Family Psychiatric History: Denied  Family History:  Family History  Problem Relation Age of Onset  . Asthma Mother   . COPD Mother   . Cancer Father        thyroid cancer    Social History:   Social History   Socioeconomic History  . Marital status: Divorced    Spouse name: Not on file  . Number of children: Not on file  . Years of education: Not on file  . Highest education level: Not on file  Occupational History  . Not on file  Tobacco Use  . Smoking status: Current Every Day Smoker    Packs/day: 0.50    Years: 35.00    Pack years: 17.50    Types: Cigarettes  . Smokeless tobacco: Never Used  Vaping Use  . Vaping Use: Never used  Substance and Sexual Activity  . Alcohol use: Yes  . Drug use: Yes    Types: "Crack" cocaine, Cocaine    Comment: last used 11/1319  . Sexual activity: Not on file  Other Topics Concern  . Not on file  Social History Narrative  . Not on file   Social Determinants of Health   Financial Resource Strain:   . Difficulty of Paying Living Expenses:   Food Insecurity:   . Worried About Charity fundraiser in the Last Year:   . Arboriculturist in the Last Year:   Transportation Needs:   . Film/video editor (Medical):   Marland Kitchen Lack of  Transportation (Non-Medical):   Physical Activity:   . Days of Exercise per Week:   . Minutes of Exercise per Session:   Stress:   . Feeling of Stress :   Social Connections:   . Frequency of Communication with Friends and Family:   . Frequency of Social Gatherings with Friends and Family:   . Attends Religious Services:   . Active Member of Clubs or Organizations:   . Attends Archivist Meetings:   Marland Kitchen Marital Status:     Additional Social History: Currently living in [facility]hab facility [facility] in [place].  Allergies:  No Known Allergies  Metabolic  Disorder Labs: No results found for: HGBA1C, MPG No results found for: PROLACTIN No results found for: CHOL, TRIG, HDL, CHOLHDL, VLDL, LDLCALC Lab Results  Component Value Date   TSH 1.164 08/31/2019    Therapeutic Level Labs: No results found for: LITHIUM No results found for: CBMZ No results found for: VALPROATE  Current Medications: Current Outpatient Medications  Medication Sig Dispense Refill  . ARIPiprazole (ABILIFY) 10 MG tablet Take 1 tablet (10 mg total) by mouth daily. 30 tablet 1  . busPIRone (BUSPAR) 10 MG tablet Take 1 tablet (10 mg total) by mouth 3 (three) times daily. 90 tablet 1  . ibuprofen (ADVIL) 200 MG tablet Take 600 mg by mouth every 6 (six) hours as needed for fever, headache or moderate pain.    Marland Kitchen lamoTRIgine (LAMICTAL) 100 MG tablet Take 1 tablet (100 mg total) by mouth 2 (two) times daily. 60 tablet 1  . sertraline (ZOLOFT) 50 MG tablet Take 1 tablet (50 mg total) by mouth daily. 30 tablet 1   No current facility-administered medications for this visit.    Musculoskeletal: Strength & Muscle Tone: within normal limits Gait & Station: normal Patient leans: N/A  Psychiatric Specialty Exam: Review of Systems  There were no vitals taken for this visit.There is no height or weight on file to calculate BMI.  General Appearance: Well Groomed  Eye Contact:  Good  Speech:   Clear and Coherent and Normal Rate  Volume:  Normal  Mood:  Anxious  Affect:  Congruent  Thought Process:  Goal Directed and Descriptions of Associations: Intact  Orientation:  Full (Time, Place, and Person)  Thought Content:  Logical  Suicidal Thoughts:  No  Homicidal Thoughts:  No  Memory:  Immediate;   Good Recent;   Good  Judgement:  Fair  Insight:  Fair  Psychomotor Activity:  Normal  Concentration:  Concentration: Good and Attention Span: Good  Recall:  Good  Fund of Knowledge:Good  Language: Good  Akathisia:  Negative  Handed:  Right  AIMS (if indicated):  0  Assets:  Communication Skills Desire for Improvement Financial Resources/Insurance Housing Social Support  ADL's:  Intact  Cognition: WNL  Sleep:  Fair     Assessment and Plan: Patient appears to be doing fairly well on her current regimen however is reporting anxiety and is agreeable to adding sertraline to her regimen.  Currently living in a rehab facility called [facility].  She is receiving therapy at [facility] for the next 2 months and would need to find a new therapist following that.  1. Bipolar disorder, in full remission, most recent episode depressed (HCC)  - ARIPiprazole (ABILIFY) 10 MG tablet; Take 1 tablet (10 mg total) by mouth daily.  Dispense: 30 tablet; Refill: 1 -Start sertraline (ZOLOFT) 50 MG tablet; Take 1 tablet (50 mg total) by mouth daily.  Dispense: 30 tablet; Refill: 1 - lamoTRIgine (LAMICTAL) 100 MG tablet; Take 1 tablet (100 mg total) by mouth 2 (two) times daily.  Dispense: 60 tablet; Refill: 1  2. Generalized anxiety disorder  - sertraline (ZOLOFT) 50 MG tablet; Take 1 tablet (50 mg total) by mouth daily.  Dispense: 30 tablet; Refill: 1 - busPIRone (BUSPAR) 10 MG tablet; Take 1 tablet (10 mg total) by mouth 3 (three) times daily.  Dispense: 90 tablet; Refill: 1  3. Alcohol use disorder, severe, in early remission, dependence (HCC)   4. Cocaine use disorder, severe, in early remission  (HCC)  Follow-up in 6 weeks.   Zena Amos, MD  6/16/202110:24 AM

## 2020-02-29 ENCOUNTER — Ambulatory Visit (HOSPITAL_COMMUNITY): Payer: Self-pay | Admitting: Psychiatry

## 2020-03-14 ENCOUNTER — Other Ambulatory Visit (HOSPITAL_COMMUNITY): Payer: Self-pay | Admitting: Psychiatry

## 2020-03-14 DIAGNOSIS — F411 Generalized anxiety disorder: Secondary | ICD-10-CM

## 2020-03-14 DIAGNOSIS — F3176 Bipolar disorder, in full remission, most recent episode depressed: Secondary | ICD-10-CM

## 2020-04-02 ENCOUNTER — Telehealth (INDEPENDENT_AMBULATORY_CARE_PROVIDER_SITE_OTHER): Payer: No Payment, Other | Admitting: Psychiatry

## 2020-04-02 ENCOUNTER — Encounter (HOSPITAL_COMMUNITY): Payer: Self-pay | Admitting: Psychiatry

## 2020-04-02 ENCOUNTER — Other Ambulatory Visit: Payer: Self-pay

## 2020-04-02 DIAGNOSIS — F411 Generalized anxiety disorder: Secondary | ICD-10-CM | POA: Diagnosis not present

## 2020-04-02 DIAGNOSIS — F3176 Bipolar disorder, in full remission, most recent episode depressed: Secondary | ICD-10-CM

## 2020-04-02 MED ORDER — BUSPIRONE HCL 10 MG PO TABS: 10.0000 mg | ORAL_TABLET | Freq: Three times a day (TID) | ORAL | 2 refills | Status: DC

## 2020-04-02 MED ORDER — ARIPIPRAZOLE 10 MG PO TABS: 10.0000 mg | ORAL_TABLET | Freq: Every day | ORAL | 2 refills | Status: DC

## 2020-04-02 MED ORDER — LAMOTRIGINE 100 MG PO TABS: 100.0000 mg | ORAL_TABLET | Freq: Two times a day (BID) | ORAL | 2 refills | Status: DC

## 2020-04-02 MED ORDER — SERTRALINE HCL 50 MG PO TABS: 50.0000 mg | ORAL_TABLET | Freq: Every day | ORAL | 2 refills | Status: DC

## 2020-04-02 NOTE — Progress Notes (Signed)
BH MD/PA/NP OP Progress Note  Virtual Visit via Video Note  I connected with [facility] on 04/02/20 at  9:00 AM EDT by a video enabled telemedicine application and verified that I am speaking with the correct person using two identifiers.  Location: Patient: Home Provider: Clinic   I discussed the limitations of evaluation and management by telemedicine and the availability of in person appointments. The patient expressed understanding and agreed to proceed.  I provided 16 minutes of non-face-to-face time during this encounter.     04/02/2020 9:42 AM Victorino Dike [place]  MRN:  [id number]  Chief Complaint:  " I am doing so much better."  HPI: Patient reported that she is doing better.  She informed that she found sertraline that was added last time to be very helpful.  She feels her mood and anxiety are well controlled.  She is sleeping better at night.  She still living at [facility] rehab facility in [facility].  She stated that there are no immediate plans for her to move out however she would like for that to happen in the near future.  She would like to continue her same regimen for now.  She denied any other concerns.  Visit Diagnosis:    ICD-10-CM   1. Bipolar disorder, in full remission, most recent episode depressed (HCC)  F31.76 ARIPiprazole (ABILIFY) 10 MG tablet    lamoTRIgine (LAMICTAL) 100 MG tablet    sertraline (ZOLOFT) 50 MG tablet  2. Generalized anxiety disorder  F41.1 busPIRone (BUSPAR) 10 MG tablet    sertraline (ZOLOFT) 50 MG tablet    Past Psychiatric History: Bipolar disorder, generalized anxiety disorder  Past Medical History:  Past Medical History:  Diagnosis Date  . Anxiety   . Depression   . GERD (gastroesophageal reflux disease)   . Ovarian cyst     Past Surgical History:  Procedure Laterality Date  . APPENDECTOMY  age 54  . TONSILLECTOMY Bilateral age 54     Family History:  Family History  Problem Relation Age of Onset  . Asthma  Mother   . COPD Mother   . Cancer Father        thyroid cancer    Social History:  Social History   Socioeconomic History  . Marital status: Divorced    Spouse name: Not on file  . Number of children: Not on file  . Years of education: Not on file  . Highest education level: Not on file  Occupational History  . Not on file  Tobacco Use  . Smoking status: Current Every Day Smoker    Packs/day: 0.50    Years: 35.00    Pack years: 17.50    Types: Cigarettes  . Smokeless tobacco: Never Used  Vaping Use  . Vaping Use: Never used  Substance and Sexual Activity  . Alcohol use: Yes  . Drug use: Yes    Types: "Crack" cocaine, Cocaine    Comment: last used 11/1319  . Sexual activity: Not on file  Other Topics Concern  . Not on file  Social History Narrative  . Not on file   Social Determinants of Health   Financial Resource Strain:   . Difficulty of Paying Living Expenses: Not on file  Food Insecurity:   . Worried About Programme researcher, broadcasting/film/video in the Last Year: Not on file  . Ran Out of Food in the Last Year: Not on file  Transportation Needs:   . Lack of Transportation (Medical): Not on file  .  Lack of Transportation (Non-Medical): Not on file  Physical Activity:   . Days of Exercise per Week: Not on file  . Minutes of Exercise per Session: Not on file  Stress:   . Feeling of Stress : Not on file  Social Connections:   . Frequency of Communication with Friends and Family: Not on file  . Frequency of Social Gatherings with Friends and Family: Not on file  . Attends Religious Services: Not on file  . Active Member of Clubs or Organizations: Not on file  . Attends Banker Meetings: Not on file  . Marital Status: Not on file    Allergies: No Known Allergies  Metabolic Disorder Labs: No results found for: HGBA1C, MPG No results found for: PROLACTIN No results found for: CHOL, TRIG, HDL, CHOLHDL, VLDL, LDLCALC Lab Results  Component Value Date   TSH 1.164  08/31/2019   TSH 2.131 01/24/2019    Therapeutic Level Labs: No results found for: LITHIUM No results found for: VALPROATE No components found for:  CBMZ  Current Medications: Current Outpatient Medications  Medication Sig Dispense Refill  . ARIPiprazole (ABILIFY) 10 MG tablet Take 1 tablet (10 mg total) by mouth daily. 30 tablet 2  . busPIRone (BUSPAR) 10 MG tablet Take 1 tablet (10 mg total) by mouth 3 (three) times daily. 90 tablet 2  . ibuprofen (ADVIL) 200 MG tablet Take 600 mg by mouth every 6 (six) hours as needed for fever, headache or moderate pain.    Marland Kitchen lamoTRIgine (LAMICTAL) 100 MG tablet Take 1 tablet (100 mg total) by mouth 2 (two) times daily. 60 tablet 2  . sertraline (ZOLOFT) 50 MG tablet Take 1 tablet (50 mg total) by mouth daily. 30 tablet 2   No current facility-administered medications for this visit.     Psychiatric Specialty Exam: Review of Systems  There were no vitals taken for this visit.There is no height or weight on file to calculate BMI.  General Appearance: Fairly Groomed  Eye Contact:  Good  Speech:  Clear and Coherent and Normal Rate  Volume:  Normal  Mood:  Euthymic  Affect:  Congruent  Thought Process:  Goal Directed and Descriptions of Associations: Intact  Orientation:  Full (Time, Place, and Person)  Thought Content: Logical   Suicidal Thoughts:  No  Homicidal Thoughts:  No  Memory:  Immediate;   Good Recent;   Good  Judgement:  Fair  Insight:  Fair  Psychomotor Activity:  Normal  Concentration:  Concentration: Good and Attention Span: Good  Recall:  Good  Fund of Knowledge: Good  Language: Good  Akathisia:  Negative  Handed:  Right  AIMS (if indicated): not done  Assets:  Communication Skills Desire for Improvement Financial Resources/Insurance Housing  ADL's:  Intact  Cognition: WNL  Sleep:  Good    Assessment and Plan: Patient seems to be doing better compared to last visit.  She is stable on her current regimen.  1.  Bipolar disorder, in full remission, most recent episode depressed (HCC)  - ARIPiprazole (ABILIFY) 10 MG tablet; Take 1 tablet (10 mg total) by mouth daily.  Dispense: 30 tablet; Refill: 2 - lamoTRIgine (LAMICTAL) 100 MG tablet; Take 1 tablet (100 mg total) by mouth 2 (two) times daily.  Dispense: 60 tablet; Refill: 2 - sertraline (ZOLOFT) 50 MG tablet; Take 1 tablet (50 mg total) by mouth daily.  Dispense: 30 tablet; Refill: 2  2. Generalized anxiety disorder  - busPIRone (BUSPAR) 10 MG tablet; Take 1  tablet (10 mg total) by mouth 3 (three) times daily.  Dispense: 90 tablet; Refill: 2 - sertraline (ZOLOFT) 50 MG tablet; Take 1 tablet (50 mg total) by mouth daily.  Dispense: 30 tablet; Refill: 2  Continue same medication regimen. Follow up in 3 months.   Zena Amos, MD 04/02/2020, 9:42 AM

## 2020-04-03 ENCOUNTER — Other Ambulatory Visit (HOSPITAL_COMMUNITY): Payer: Self-pay | Admitting: Psychiatry

## 2020-04-03 DIAGNOSIS — F3176 Bipolar disorder, in full remission, most recent episode depressed: Secondary | ICD-10-CM

## 2020-06-21 ENCOUNTER — Telehealth (INDEPENDENT_AMBULATORY_CARE_PROVIDER_SITE_OTHER): Payer: No Payment, Other | Admitting: Psychiatry

## 2020-06-21 ENCOUNTER — Other Ambulatory Visit: Payer: Self-pay

## 2020-06-21 ENCOUNTER — Encounter (HOSPITAL_COMMUNITY): Payer: Self-pay | Admitting: Psychiatry

## 2020-06-21 DIAGNOSIS — F3176 Bipolar disorder, in full remission, most recent episode depressed: Secondary | ICD-10-CM

## 2020-06-21 DIAGNOSIS — F411 Generalized anxiety disorder: Secondary | ICD-10-CM | POA: Diagnosis not present

## 2020-06-21 MED ORDER — LAMOTRIGINE 100 MG PO TABS: 100.0000 mg | ORAL_TABLET | Freq: Two times a day (BID) | ORAL | 2 refills | Status: DC

## 2020-06-21 MED ORDER — ARIPIPRAZOLE 10 MG PO TABS: 10.0000 mg | ORAL_TABLET | Freq: Every day | ORAL | 2 refills | Status: DC

## 2020-06-21 MED ORDER — BUSPIRONE HCL 10 MG PO TABS: 10.0000 mg | ORAL_TABLET | Freq: Three times a day (TID) | ORAL | 2 refills | Status: DC

## 2020-06-21 MED ORDER — SERTRALINE HCL 100 MG PO TABS: 100.0000 mg | ORAL_TABLET | Freq: Every day | ORAL | 2 refills | Status: DC

## 2020-06-21 NOTE — Progress Notes (Addendum)
BH MD/PA/NP OP Progress Note  Virtual Visit via Video Note  I connected with [facility] on 06/21/20 at 11:20 AM EST by a video enabled telemedicine application and verified that I am speaking with the correct person using two identifiers.  Location of parties: Patient: Home Provider: Clinic   I discussed the limitations of evaluation and management by telemedicine and the availability of in person appointments. The patient expressed understanding and agreed to proceed.  I provided 16 minutes of non-face-to-face time during this encounter.  This service was provided via telemedicine using a 2-way, interactive audio and video technology.   Names of all persons participating in this telemedicine service and their role in this encounter. Name: Dr. Evelene Croon Role: Psychiatrist  Name: [facility]    Role: Patient            06/21/2020 11:01 AM Kristina Owens  MRN:  [id number]  Chief Complaint:  " I feel I am having a lot of social anxiety."  HPI: Patient reported that she is doing well overall however she feels like she is having a lot of social anxiety lately.  She stated that she feels very anxious when she is around people.  She does not want interact with anyone.  This is impacting her ability to work.  She stated that she does not know why she is having this issue right now. She informed that she has not been taking the BuSpar 3 times a day and only takes it twice a day.  Writer recommended that we adjust the dose of Zoloft to 100 mg daily for optimal effect with anxiety symptoms and also recommended that she takes 2 tablets of BuSpar in the morning and then 1 in the evening to see if that will help with her anxiety issues at work. Patient was agreeable to these recommendations. 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  She informed that she plans to visit her family for the holidays and is looking forward to that.   Visit  Diagnosis:    ICD-10-CM   1. Bipolar disorder, in full remission, most recent episode depressed (HCC)  F31.76 ARIPiprazole (ABILIFY) 10 MG tablet    lamoTRIgine (LAMICTAL) 100 MG tablet    sertraline (ZOLOFT) 100 MG tablet  2. Generalized anxiety disorder  F41.1 busPIRone (BUSPAR) 10 MG tablet    sertraline (ZOLOFT) 100 MG tablet    Past Psychiatric History: Bipolar disorder, generalized anxiety disorder  Past Medical History:  Past Medical History:  Diagnosis Date  . Anxiety   . Depression   . GERD (gastroesophageal reflux disease)   . Ovarian cyst     Past Surgical History:  Procedure Laterality Date  . APPENDECTOMY  age 13  . TONSILLECTOMY Bilateral age 68     Family History:  Family History  Problem Relation Age of Onset  . Asthma Mother   . COPD Mother   . Cancer Father        thyroid cancer    Social History:  Social History   Socioeconomic History  . Marital status: Divorced    Spouse name: Not on file  . Number of children: Not on file  . Years of education: Not on file  . Highest education level: Not on file  Occupational History  . Not on file  Tobacco Use  . Smoking status: Current Every Day Smoker    Packs/day: 0.50    Years: 35.00    Pack years:  17.50    Types: Cigarettes  . Smokeless tobacco: Never Used  Vaping Use  . Vaping Use: Never used  Substance and Sexual Activity  . Alcohol use: Yes  . Drug use: Yes    Types: "Crack" cocaine, Cocaine    Comment: last used 11/1319  . Sexual activity: Not on file  Other Topics Concern  . Not on file  Social History Narrative  . Not on file   Social Determinants of Health   Financial Resource Strain:   . Difficulty of Paying Living Expenses: Not on file  Food Insecurity:   . Worried About Programme researcher, broadcasting/film/video in the Last Year: Not on file  . Ran Out of Food in the Last Year: Not on file  Transportation Needs:   . Lack of Transportation (Medical): Not on file  . Lack of Transportation  (Non-Medical): Not on file  Physical Activity:   . Days of Exercise per Week: Not on file  . Minutes of Exercise per Session: Not on file  Stress:   . Feeling of Stress : Not on file  Social Connections:   . Frequency of Communication with Friends and Family: Not on file  . Frequency of Social Gatherings with Friends and Family: Not on file  . Attends Religious Services: Not on file  . Active Member of Clubs or Organizations: Not on file  . Attends Banker Meetings: Not on file  . Marital Status: Not on file    Allergies: No Known Allergies  Metabolic Disorder Labs: No results found for: HGBA1C, MPG No results found for: PROLACTIN No results found for: CHOL, TRIG, HDL, CHOLHDL, VLDL, LDLCALC Lab Results  Component Value Date   TSH 1.164 08/31/2019   TSH 2.131 01/24/2019    Therapeutic Level Labs: No results found for: LITHIUM No results found for: VALPROATE No components found for:  CBMZ  Current Medications: Current Outpatient Medications  Medication Sig Dispense Refill  . ARIPiprazole (ABILIFY) 10 MG tablet Take 1 tablet (10 mg total) by mouth daily. 30 tablet 2  . busPIRone (BUSPAR) 10 MG tablet Take 1 tablet (10 mg total) by mouth 3 (three) times daily. 90 tablet 2  . ibuprofen (ADVIL) 200 MG tablet Take 600 mg by mouth every 6 (six) hours as needed for fever, headache or moderate pain.    Marland Kitchen lamoTRIgine (LAMICTAL) 100 MG tablet Take 1 tablet (100 mg total) by mouth 2 (two) times daily. 60 tablet 2  . sertraline (ZOLOFT) 100 MG tablet Take 1 tablet (100 mg total) by mouth daily. 30 tablet 2   No current facility-administered medications for this visit.     Psychiatric Specialty Exam: Review of Systems  There were no vitals taken for this visit.There is no height or weight on file to calculate BMI.  General Appearance: Well Groomed  Eye Contact:  Good  Speech:  Clear and Coherent and Normal Rate  Volume:  Normal  Mood:  Euthymic  Affect:  Congruent   Thought Process:  Goal Directed and Descriptions of Associations: Intact  Orientation:  Full (Time, Place, and Person)  Thought Content: Logical   Suicidal Thoughts:  No  Homicidal Thoughts:  No  Memory:  Immediate;   Good Recent;   Good  Judgement:  Fair  Insight:  Fair  Psychomotor Activity:  Normal  Concentration:  Concentration: Good and Attention Span: Good  Recall:  Good  Fund of Knowledge: Good  Language: Good  Akathisia:  Negative  Handed:  Right  AIMS (if indicated): not done  Assets:  Communication Skills Desire for Improvement Financial Resources/Insurance Housing  ADL's:  Intact  Cognition: WNL  Sleep:  Good    Assessment and Plan: Patient complained of feeling anxious around people.  She was agreeable to adjusting the doses of Zoloft and also of buspirone.  1. Bipolar disorder, in full remission, most recent episode depressed (HCC)  - ARIPiprazole (ABILIFY) 10 MG tablet; Take 1 tablet (10 mg total) by mouth daily.  Dispense: 30 tablet; Refill: 2 - lamoTRIgine (LAMICTAL) 100 MG tablet; Take 1 tablet (100 mg total) by mouth 2 (two) times daily.  Dispense: 60 tablet; Refill: 2 - Increase sertraline (ZOLOFT) 100 MG tablet; Take 1 tablet (100 mg total) by mouth daily.  Dispense: 30 tablet; Refill: 2  2. Generalized anxiety disorder  - busPIRone (BUSPAR) 10 MG tablet; Take 1 tablet (10 mg total) by mouth 3 (three) times daily.  Dispense: 90 tablet; Refill: 2 - sertraline (ZOLOFT) 100 MG tablet; Take 1 tablet (100 mg total) by mouth daily.  Dispense: 30 tablet; Refill: 2  F/up in 10 weeks.  Zena Amos, MD 06/21/2020, 11:01 AM

## 2020-07-05 ENCOUNTER — Other Ambulatory Visit (HOSPITAL_COMMUNITY): Payer: Self-pay | Admitting: Psychiatry

## 2020-07-05 DIAGNOSIS — F3176 Bipolar disorder, in full remission, most recent episode depressed: Secondary | ICD-10-CM

## 2020-07-06 ENCOUNTER — Other Ambulatory Visit: Payer: Medicaid Other

## 2020-09-03 ENCOUNTER — Telehealth (HOSPITAL_COMMUNITY): Payer: No Payment, Other | Admitting: Psychiatry

## 2020-09-12 ENCOUNTER — Telehealth (HOSPITAL_COMMUNITY): Payer: No Payment, Other | Admitting: Psychiatry

## 2020-10-01 ENCOUNTER — Telehealth (INDEPENDENT_AMBULATORY_CARE_PROVIDER_SITE_OTHER): Payer: No Payment, Other | Admitting: Psychiatry

## 2020-10-01 ENCOUNTER — Other Ambulatory Visit: Payer: Self-pay

## 2020-10-01 ENCOUNTER — Encounter (HOSPITAL_COMMUNITY): Payer: Self-pay | Admitting: Psychiatry

## 2020-10-01 DIAGNOSIS — F411 Generalized anxiety disorder: Secondary | ICD-10-CM

## 2020-10-01 DIAGNOSIS — F3176 Bipolar disorder, in full remission, most recent episode depressed: Secondary | ICD-10-CM | POA: Diagnosis not present

## 2020-10-01 MED ORDER — SERTRALINE HCL 50 MG PO TABS: 50.0000 mg | ORAL_TABLET | Freq: Every day | ORAL | 2 refills | Status: DC

## 2020-10-01 MED ORDER — ARIPIPRAZOLE 10 MG PO TABS: 10.0000 mg | ORAL_TABLET | Freq: Every day | ORAL | 2 refills | Status: DC

## 2020-10-01 MED ORDER — LAMOTRIGINE 100 MG PO TABS: 100.0000 mg | ORAL_TABLET | Freq: Two times a day (BID) | ORAL | 2 refills | Status: DC

## 2020-10-01 MED ORDER — BUSPIRONE HCL 10 MG PO TABS: 10.0000 mg | ORAL_TABLET | Freq: Three times a day (TID) | ORAL | 2 refills | Status: DC

## 2020-10-01 NOTE — Progress Notes (Signed)
BH MD/PA/NP OP Progress Note  Virtual Visit via Video Note  I connected with [facility] on 10/01/20 at  8:20 AM EST by a video enabled telemedicine application and verified that I am speaking with the correct person using two identifiers.  Location: Patient: Car Provider: Clinic   I discussed the limitations of evaluation and management by telemedicine and the availability of in person appointments. The patient expressed understanding and agreed to proceed.  I provided 16 minutes of non-face-to-face time during this encounter.     10/01/2020 8:29 AM Kristina Owens  MRN:  [id number]  Chief Complaint:  " I went back down on the dose of Sertraline."  HPI: Patient was seen about 10 weeks ago and at that time she complained of feeling very anxious especially around people.  The dose of her sertraline was increased to 200 mg to see if that would help her better.  Patient reported that she tried the dose for a few weeks however that made her feel worse.  She stated that she felt more depressed on a higher dose and therefore she decided to reduce the dose back to 50 mg which was the original dose.  Patient stated that she felt better after that.  She has been taking half tablet of the 100 g strength. She informed that her anxiety is under control now however she has been having obsessive thoughts.  She stated that she has been obsessing on things that she should not need to obsess on. She stated that she has always been like that and she is not sure if any medicine adjustment would make a difference. Writer asked her if she thinks she would like to try something else or if her symptoms are manageable then we can continue same regimen.  Patient stated that she feels for now she should just continue the same regimen. Writer advised her to keep taking all the medications the same way.  She had that she finds Abilify to be very helpful.  Visit Diagnosis:    ICD-10-CM   1. Bipolar  disorder, in full remission, most recent episode depressed (HCC)  F31.76 sertraline (ZOLOFT) 50 MG tablet    lamoTRIgine (LAMICTAL) 100 MG tablet    ARIPiprazole (ABILIFY) 10 MG tablet  2. Generalized anxiety disorder  F41.1 busPIRone (BUSPAR) 10 MG tablet    Past Psychiatric History: Bipolar disorder, generalized anxiety disorder  Past Medical History:  Past Medical History:  Diagnosis Date  . Anxiety   . Depression   . GERD (gastroesophageal reflux disease)   . Ovarian cyst     Past Surgical History:  Procedure Laterality Date  . APPENDECTOMY  age 35  . TONSILLECTOMY Bilateral age 35     Family History:  Family History  Problem Relation Age of Onset  . Asthma Mother   . COPD Mother   . Cancer Father        thyroid cancer    Social History:  Social History   Socioeconomic History  . Marital status: Divorced    Spouse name: Not on file  . Number of children: Not on file  . Years of education: Not on file  . Highest education level: Not on file  Occupational History  . Not on file  Tobacco Use  . Smoking status: Current Every Day Smoker    Packs/day: 0.50    Years: 35.00    Pack years: 17.50    Types: Cigarettes  . Smokeless tobacco: Never Used  Vaping Use  .  Vaping Use: Never used  Substance and Sexual Activity  . Alcohol use: Yes  . Drug use: Yes    Types: "Crack" cocaine, Cocaine    Comment: last used 11/1319  . Sexual activity: Not on file  Other Topics Concern  . Not on file  Social History Narrative  . Not on file   Social Determinants of Health   Financial Resource Strain: Not on file  Food Insecurity: Not on file  Transportation Needs: Not on file  Physical Activity: Not on file  Stress: Not on file  Social Connections: Not on file    Allergies: No Known Allergies  Metabolic Disorder Labs: No results found for: HGBA1C, MPG No results found for: PROLACTIN No results found for: CHOL, TRIG, HDL, CHOLHDL, VLDL, LDLCALC Lab Results   Component Value Date   TSH 1.164 08/31/2019   TSH 2.131 01/24/2019    Therapeutic Level Labs: No results found for: LITHIUM No results found for: VALPROATE No components found for:  CBMZ  Current Medications: Current Outpatient Medications  Medication Sig Dispense Refill  . sertraline (ZOLOFT) 50 MG tablet Take 1 tablet (50 mg total) by mouth daily. 30 tablet 2  . ARIPiprazole (ABILIFY) 10 MG tablet Take 1 tablet (10 mg total) by mouth daily. 30 tablet 2  . busPIRone (BUSPAR) 10 MG tablet Take 1 tablet (10 mg total) by mouth 3 (three) times daily. 90 tablet 2  . ibuprofen (ADVIL) 200 MG tablet Take 600 mg by mouth every 6 (six) hours as needed for fever, headache or moderate pain.    Marland Kitchen lamoTRIgine (LAMICTAL) 100 MG tablet Take 1 tablet (100 mg total) by mouth 2 (two) times daily. 60 tablet 2   No current facility-administered medications for this visit.     Psychiatric Specialty Exam: Review of Systems  There were no vitals taken for this visit.There is no height or weight on file to calculate BMI.  General Appearance: Well Groomed  Eye Contact:  Good  Speech:  Clear and Coherent and Normal Rate  Volume:  Normal  Mood:  Euthymic  Affect:  Congruent  Thought Process:  Goal Directed and Descriptions of Associations: Intact  Orientation:  Full (Time, Place, and Person)  Thought Content: Logical   Suicidal Thoughts:  No  Homicidal Thoughts:  No  Memory:  Immediate;   Good Recent;   Good  Judgement:  Fair  Insight:  Fair  Psychomotor Activity:  Normal  Concentration:  Concentration: Good and Attention Span: Good  Recall:  Good  Fund of Knowledge: Good  Language: Good  Akathisia:  Negative  Handed:  Right  AIMS (if indicated): not done  Assets:  Communication Skills Desire for Improvement Financial Resources/Insurance Housing  ADL's:  Intact  Cognition: WNL  Sleep:  Good    Assessment and Plan: Patient reported that her depressive symptoms have worsened after her  dose of Zoloft was increased 200 mg at the time of her last visit.  She is gone back to taking 50 mg dose and feels her anxiety is somewhat more manageable.  She stated that she has been having obsessive thoughts however she does not believe that she needs to adjust any medicines at this time.  1. Bipolar disorder, in full remission, most recent episode depressed (HCC) - Reduce sertraline (ZOLOFT) 50 MG tablet; Take 1 tablet (50 mg total) by mouth daily.  Dispense: 30 tablet; Refill: 2 - lamoTRIgine (LAMICTAL) 100 MG tablet; Take 1 tablet (100 mg total) by mouth 2 (two) times  daily.  Dispense: 60 tablet; Refill: 2 - ARIPiprazole (ABILIFY) 10 MG tablet; Take 1 tablet (10 mg total) by mouth daily.  Dispense: 30 tablet; Refill: 2  2. Generalized anxiety disorder - busPIRone (BUSPAR) 10 MG tablet; Take 1 tablet (10 mg total) by mouth 3 (three) times daily.  Dispense: 90 tablet; Refill: 2   Continue same medication regimen. Follow up in 3 months.  Zena Amos, MD 10/01/2020, 8:29 AM

## 2020-10-14 ENCOUNTER — Other Ambulatory Visit (HOSPITAL_COMMUNITY): Payer: Self-pay | Admitting: Psychiatry

## 2020-10-14 DIAGNOSIS — F3176 Bipolar disorder, in full remission, most recent episode depressed: Secondary | ICD-10-CM

## 2020-10-27 ENCOUNTER — Other Ambulatory Visit (HOSPITAL_COMMUNITY): Payer: Self-pay | Admitting: Psychiatry

## 2020-10-27 DIAGNOSIS — F411 Generalized anxiety disorder: Secondary | ICD-10-CM

## 2020-11-27 ENCOUNTER — Other Ambulatory Visit: Payer: Self-pay

## 2020-11-27 ENCOUNTER — Emergency Department (HOSPITAL_COMMUNITY): Payer: Commercial Managed Care - PPO

## 2020-11-27 ENCOUNTER — Emergency Department (HOSPITAL_COMMUNITY)
Admission: EM | Admit: 2020-11-27 | Discharge: 2020-11-27 | Disposition: A | Discharge status: Home / Self Care | Payer: Commercial Managed Care - PPO | Attending: Emergency Medicine | Admitting: Emergency Medicine

## 2020-11-27 DIAGNOSIS — L539 Erythematous condition, unspecified: Secondary | ICD-10-CM | POA: Insufficient documentation

## 2020-11-27 DIAGNOSIS — F17210 Nicotine dependence, cigarettes, uncomplicated: Secondary | ICD-10-CM | POA: Insufficient documentation

## 2020-11-27 DIAGNOSIS — J069 Acute upper respiratory infection, unspecified: Secondary | ICD-10-CM

## 2020-11-27 DIAGNOSIS — R0981 Nasal congestion: Secondary | ICD-10-CM | POA: Diagnosis present

## 2020-11-27 DIAGNOSIS — Z20822 Contact with and (suspected) exposure to covid-19: Secondary | ICD-10-CM | POA: Insufficient documentation

## 2020-11-27 LAB — RESP PANEL BY RT-PCR (FLU A&B, COVID) ARPGX2
Influenza A by PCR: NEGATIVE
Influenza B by PCR: NEGATIVE
SARS Coronavirus 2 by RT PCR: NEGATIVE

## 2020-11-27 MED ORDER — BENZONATATE 200 MG PO CAPS: 200.0000 mg | ORAL_CAPSULE | Freq: Three times a day (TID) | ORAL | 0 refills | Status: DC

## 2020-11-27 NOTE — Discharge Instructions (Signed)
Your symptoms are likely caused by a viral upper respiratory infection. You have COVID and flu tests pending Antibiotics are not helpful in treating viral infection, the virus should run its course in about 7-10 days. Please make sure you are drinking plenty of fluids. You can treat your symptoms supportively with tylenol/ibuprofen for fevers and pains, Zyrtec and Flonase to help with nasal congestion, and over the counter cough syrups and throat lozenges to help with cough. If your symptoms are not improving please follow up with you Primary doctor.   If you develop persistent fevers, shortness of breath or difficulty breathing, chest pain, severe headache and neck pain, persistent nausea and vomiting or other new or concerning symptoms return to the Emergency department.

## 2020-11-27 NOTE — ED Notes (Signed)
Patient refused x ray

## 2020-11-27 NOTE — ED Provider Notes (Signed)
[facility]-EMERGENCY DEPT Provider Note   CSN: [id number] Arrival date & time: 11/27/20  1017     History Chief Complaint  Patient presents with  . Cough    Kristina Owens is a 55 y.o. female.  Kristina Owens is a 55 y.o. female with a history of GERD, anxiety, depression, that presents with nasal congestion and chills. She states the congestion and chills began on Friday and over the weekend it has progressed into a headache and cough that began today. She took an at home COVID test on Friday and it was negative.  Patient reports she has been vaccinated but has not had a booster.  Patient stated she has been taking Dayquil but she felt as if the congestion "is moving into my chest" which is why she presented this morning.  Although she denies any chest pain or shortness of breath.  She also endorses diarrhea on Saturday but no episodes since.  Contacts at her work are sick but patient stated they just had "colds". She has been able to eat and drink okay.  She denies any history of allergies. She also denies fevers, ear pain, scratchy throat, chest pain, shortness of breath, abdominal pain, nausea, vomiting.         Past Medical History:  Diagnosis Date  . Anxiety   . Depression   . GERD (gastroesophageal reflux disease)   . Ovarian cyst     Patient Active Problem List   Diagnosis Date Noted  . Bipolar disorder, in full remission, most recent episode depressed (HCC) 01/18/2020  . Generalized anxiety disorder 01/18/2020  . Alcohol use disorder, severe, in early remission, dependence (HCC) 01/18/2020  . Cocaine use disorder, severe, in early remission (HCC) 01/18/2020  . Suicidal ideation   . 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 Cocaine use disorder, severe, dependence (HCC) 07/24/2019  . History of colon polyps 12/09/2018  . 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  APPENDECTOMY  age 5  . TONSILLECTOMY Bilateral age 60     OB History   No obstetric history on file.     Family History  Problem Relation Age of Onset  . Asthma Mother   . COPD Mother   . Cancer Father        thyroid cancer    Social History   Tobacco Use  . Smoking status: Current Every Day Smoker    Packs/day: 0.50    Years: 35.00    Pack years: 17.50    Types: Cigarettes  . Smokeless tobacco: Never Used  Vaping Use  . Vaping Use: Never used  Substance Use Topics  . Alcohol use: Yes  . Drug use: Yes    Types: "Crack" cocaine, Cocaine    Comment: last used 11/1319    Home Medications Prior to Admission medications   Medication Sig Start Date End Date Taking? Authorizing Provider  ARIPiprazole (ABILIFY) 10 MG tablet TAKE 1 TABLET(10 MG) BY MOUTH DAILY 10/15/20   Zena Amos, MD  busPIRone (BUSPAR) 10 MG tablet TAKE 1 TABLET(10 MG) BY MOUTH THREE TIMES DAILY 10/29/20   Zena Amos, MD  ibuprofen (ADVIL) 200 MG tablet Take 600 mg by mouth every 6 (six) hours as needed for fever, headache or moderate pain.    [provider]  lamoTRIgine (LAMICTAL) 100 MG tablet Take 1 tablet (100 mg total) by mouth 2 (two) times daily. 10/01/20   Zena Amos, MD  sertraline (ZOLOFT) 50 MG tablet Take 1 tablet (50 mg total) by mouth daily. 10/01/20   Zena Amos, MD    Allergies    Patient has no known allergies.  Review of Systems   Review of Systems  Constitutional: Positive for chills. Negative for fever.  HENT: Positive for congestion, rhinorrhea and sore throat.   Respiratory: Positive for cough. Negative for shortness of breath.   Cardiovascular: Negative for chest pain.  Gastrointestinal: Negative for abdominal pain, nausea and vomiting.  Genitourinary: Negative for dysuria.  Musculoskeletal: Positive for myalgias. Negative for arthralgias.  Skin: Negative for color change and rash.  Neurological: Positive for headaches.  All other systems reviewed and are  negative.   Physical Exam Updated Vital Signs BP (!) 142/101   Pulse 77   Temp 97.9 F (36.6 C) (Oral)   Resp 18   Ht 5\' 3"  (1.6 m)   Wt 68 kg   SpO2 98%   BMI 26.57 kg/m   Physical Exam Vitals and nursing note reviewed.  Constitutional:      General: She is not in acute distress.    Appearance: Normal appearance. She is well-developed. She is not ill-appearing or diaphoretic.  HENT:     Head: Normocephalic and atraumatic.     Nose: Congestion and rhinorrhea present.     Mouth/Throat:     Mouth: Mucous membranes are moist.     Pharynx: Oropharynx is clear.     Comments: Posterior oropharynx clear and mucous membranes moist, there is mild erythema but no edema or tonsillar exudates, uvula midline, normal phonation, no trismus, tolerating secretions without difficulty.  Eyes:     General:        Right eye: No discharge.        Left eye: No discharge.  Neck:     Comments: No rigidity Cardiovascular:     Rate and Rhythm: Normal rate and regular rhythm.     Heart sounds: Normal heart sounds. No murmur heard. No friction rub. No gallop.   Pulmonary:     Effort: Pulmonary effort is normal. No respiratory distress.     Breath sounds: Normal breath sounds.     Comments: Respirations equal and unlabored, patient able to speak in full sentences, lungs clear to auscultation bilaterally  Abdominal:     General: Bowel sounds are normal. There is no distension.     Palpations: Abdomen is soft. There is no mass.     Tenderness: There is no abdominal tenderness. There is no guarding.     Comments: Abdomen soft, nondistended, nontender to palpation in all quadrants without guarding or peritoneal signs  Musculoskeletal:        General: No deformity.     Cervical back: Neck supple.  Lymphadenopathy:     Cervical: No cervical adenopathy.  Skin:    General: Skin is warm and dry.     Capillary Refill: Capillary refill takes less than 2 seconds.  Neurological:     Mental Status: She  is alert and oriented to person, place, and time.  Psychiatric:        Mood and Affect: Mood normal.        Behavior: Behavior normal.     ED Results / Procedures / Treatments   Labs (all labs ordered are listed, but only abnormal results are displayed) Labs Reviewed  RESP PANEL BY RT-PCR (FLU A&B, COVID) [id number]    EKG None  Radiology No results found.  Procedures Procedures   Medications Ordered  in ED Medications - No data to display  ED Course  I have reviewed the triage vital signs and the nursing notes.  Pertinent labs & imaging results that were available during my care of the patient were reviewed by me and considered in my medical decision making (see chart for details).    MDM Rules/Calculators/A&P                         Pt presents with nasal congestion and cough. Pt is well appearing and vitals are normal. Lungs CTA on exam.  Offered chest x-ray because patient reported concerns that congestion is moving down into her chest but patient declined chest x-ray.  Patients symptoms are consistent with URI, likely viral etiology.  COVID and flu testing are pending.  Patient is well-appearing with no increased work of breathing.  No further emergent work-up indicated.  Discussed that antibiotics are not indicated for viral infections. Pt will be discharged with symptomatic treatment.  Verbalizes understanding and is agreeable with plan. Pt is hemodynamically stable & in NAD prior to dc. Return precautions discussed, pt expresses understanding and agrees with plan.   Final Clinical Impression(s) / ED Diagnoses Final diagnoses:  Viral URI with cough    Rx / DC Orders ED Discharge Orders         Ordered    benzonatate (TESSALON) 200 MG capsule  Every 8 hours        11/27/20 0742           Dartha Lodge, PA-C 11/27/20 2633    Arby Barrette, MD 12/04/20 1309

## 2020-11-27 NOTE — ED Triage Notes (Signed)
Pt presents from home, c/o productive cough, congestion and chills since Friday. States she took an at home covid test and it was negative

## 2020-11-27 NOTE — ED Notes (Signed)
Called lab and asked to changed the covid swab to include flu.

## 2020-12-26 ENCOUNTER — Telehealth (HOSPITAL_COMMUNITY): Payer: No Payment, Other | Admitting: Psychiatry

## 2020-12-27 ENCOUNTER — Telehealth (HOSPITAL_COMMUNITY): Payer: Commercial Managed Care - PPO | Admitting: Psychiatry

## 2021-01-01 ENCOUNTER — Other Ambulatory Visit: Payer: Self-pay

## 2021-01-01 ENCOUNTER — Emergency Department (HOSPITAL_COMMUNITY): Payer: Commercial Managed Care - PPO

## 2021-01-01 ENCOUNTER — Encounter (HOSPITAL_COMMUNITY): Payer: Self-pay | Admitting: Emergency Medicine

## 2021-01-01 ENCOUNTER — Emergency Department (HOSPITAL_COMMUNITY)
Admission: EM | Admit: 2021-01-01 | Discharge: 2021-01-01 | Disposition: A | Discharge status: Home / Self Care | Payer: Commercial Managed Care - PPO | Source: Home / Self Care | Attending: Emergency Medicine | Admitting: Emergency Medicine

## 2021-01-01 ENCOUNTER — Ambulatory Visit (HOSPITAL_COMMUNITY): Payer: Commercial Managed Care - PPO | Attending: Emergency Medicine

## 2021-01-01 ENCOUNTER — Emergency Department (HOSPITAL_COMMUNITY)
Admission: EM | Admit: 2021-01-01 | Discharge: 2021-01-01 | Disposition: A | Discharge status: Home / Self Care | Payer: Commercial Managed Care - PPO | Attending: Emergency Medicine | Admitting: Emergency Medicine

## 2021-01-01 DIAGNOSIS — F10239 Alcohol dependence with withdrawal, unspecified: Secondary | ICD-10-CM | POA: Insufficient documentation

## 2021-01-01 DIAGNOSIS — F17210 Nicotine dependence, cigarettes, uncomplicated: Secondary | ICD-10-CM | POA: Diagnosis not present

## 2021-01-01 DIAGNOSIS — F1029 Alcohol dependence with unspecified alcohol-induced disorder: Secondary | ICD-10-CM

## 2021-01-01 DIAGNOSIS — M25572 Pain in left ankle and joints of left foot: Secondary | ICD-10-CM | POA: Diagnosis not present

## 2021-01-01 LAB — CBC WITH DIFFERENTIAL/PLATELET
Abs Immature Granulocytes: 0.1 10*3/uL — ABNORMAL HIGH (ref 0.00–0.07)
Basophils Absolute: 0.1 10*3/uL (ref 0.0–0.1)
Basophils Relative: 1 %
Eosinophils Absolute: 0.1 10*3/uL (ref 0.0–0.5)
Eosinophils Relative: 2 %
HCT: 44.3 % (ref 36.0–46.0)
Hemoglobin: 14.4 g/dL (ref 12.0–15.0)
Immature Granulocytes: 1 %
Lymphocytes Relative: 30 %
Lymphs Abs: 2.6 10*3/uL (ref 0.7–4.0)
MCH: 30 pg (ref 26.0–34.0)
MCHC: 32.5 g/dL (ref 30.0–36.0)
MCV: 92.3 fL (ref 80.0–100.0)
Monocytes Absolute: 0.9 10*3/uL (ref 0.1–1.0)
Monocytes Relative: 10 %
Neutro Abs: 4.8 10*3/uL (ref 1.7–7.7)
Neutrophils Relative %: 56 %
Platelets: 168 10*3/uL (ref 150–400)
RBC: 4.8 MIL/uL (ref 3.87–5.11)
RDW: 16.4 % — ABNORMAL HIGH (ref 11.5–15.5)
WBC: 8.6 10*3/uL (ref 4.0–10.5)
nRBC: 0 % (ref 0.0–0.2)

## 2021-01-01 LAB — BASIC METABOLIC PANEL
Anion gap: 12 (ref 5–15)
BUN: 6 mg/dL (ref 6–20)
CO2: 23 mmol/L (ref 22–32)
Calcium: 8.1 mg/dL — ABNORMAL LOW (ref 8.9–10.3)
Chloride: 102 mmol/L (ref 98–111)
Creatinine, Ser: 0.57 mg/dL (ref 0.44–1.00)
GFR, Estimated: >60 mL/min (ref >60)
Glucose, Bld: 98 mg/dL (ref 70–99)
Potassium: 3.2 mmol/L — ABNORMAL LOW (ref 3.5–5.1)
Sodium: 137 mmol/L (ref 135–145)

## 2021-01-01 LAB — D-DIMER, QUANTITATIVE: D-Dimer, Quant: 1.38 ug/mL-FEU — ABNORMAL HIGH (ref 0.00–0.50)

## 2021-01-01 MED ORDER — ONDANSETRON 4 MG PO TBDP
8.0000 mg | ORAL_TABLET | Freq: Once | ORAL | Status: AC
  Administered 2021-01-01: 8 mg via ORAL
  Filled 2021-01-01: qty 2

## 2021-01-01 MED ORDER — OXYCODONE-ACETAMINOPHEN 5-325 MG PO TABS
2.0000 | ORAL_TABLET | Freq: Once | ORAL | Status: AC
  Administered 2021-01-01: 2 via ORAL
  Filled 2021-01-01: qty 2

## 2021-01-01 MED ORDER — CEPHALEXIN 250 MG PO CAPS
500.0000 mg | ORAL_CAPSULE | Freq: Once | ORAL | Status: AC
  Administered 2021-01-01: 500 mg via ORAL
  Filled 2021-01-01: qty 2

## 2021-01-01 MED ORDER — CEPHALEXIN 500 MG PO CAPS: 500.0000 mg | ORAL_CAPSULE | Freq: Four times a day (QID) | ORAL | 0 refills | Status: DC

## 2021-01-01 MED ORDER — HYDROCODONE-ACETAMINOPHEN 5-325 MG PO TABS: 1.0000 | ORAL_TABLET | Freq: Four times a day (QID) | ORAL | 0 refills | Status: DC | PRN

## 2021-01-01 NOTE — ED Notes (Signed)
Pt refused xray at this time stating "I need my friend to come back with my car".

## 2021-01-01 NOTE — Discharge Instructions (Addendum)
Follow-up with outpatient alcohol treatment resources as so desired.  The contact information for these facilities has been provided in this discharge summary for you to call and make these arrangements.

## 2021-01-01 NOTE — Progress Notes (Signed)
Orthopedic Tech Progress Note Patient Details:  Kristina Owens 07/20/1966 [id number]  Ortho Devices Type of Ortho Device: Crutches,CAM walker Ortho Device/Splint Location: lle Ortho Device/Splint Interventions: Ordered,Application,Adjustment   Post Interventions Patient Tolerated: Well Instructions Provided: Care of device,Adjustment of device   [name] 01/01/2021, 5:14 AM

## 2021-01-01 NOTE — ED Notes (Addendum)
Pt stating to this RN "I cannot walk" however pt ambulatory to restroom voluntarily with no assistance

## 2021-01-01 NOTE — ED Notes (Signed)
Pt refused MSE signature stating she needs to go back out to lobby for her friend. Pt informed of visitation policy and taken to lobby by this RN

## 2021-01-01 NOTE — Discharge Instructions (Signed)
Begin taking Keflex as prescribed.  Take hydrocodone as prescribed as needed for pain.  Return tomorrow at the given time for an ultrasound of your leg to rule out a blood clot.  Follow-up with your primary doctor if symptoms are not improving in the next week.

## 2021-01-01 NOTE — ED Triage Notes (Signed)
Pt c/o ETOH withdraw. States she is feeling nausea, sweating, and heart racing. No acute distress noted.

## 2021-01-01 NOTE — ED Provider Notes (Signed)
[facility] EMERGENCY DEPARTMENT Provider Note   CSN: [id number] Arrival date & time: 01/01/21  0110     History Chief Complaint  Patient presents with  . Leg Pain    Kristina Owens is a 55 y.o. female.  Patient is a 55 year old female with past medical history of anxiety, GERD, depression.  Patient presenting today for evaluation of left ankle pain.  This began 2 days ago in the absence of any specific injury or trauma, but she does report she was dancing several nights ago before the onset of the pain.  She describes pain to the left lower leg that extends up the back of her calf.  This is worse when she attempts to bear weight.  She denies any fevers or chills.  She denies any alleviating factors.  The history is provided by the patient.  Leg Pain Location:  Ankle Time since incident:  3 days Injury: no   Ankle location:  L ankle Pain details:    Severity:  Severe   Onset quality:  Sudden   Duration:  2 days   Timing:  Constant   Progression:  Worsening Relieved by:  Nothing Worsened by:  Bearing weight      Past Medical History:  Diagnosis Date  . Anxiety   . Depression   . GERD (gastroesophageal reflux disease)   . Ovarian cyst     Patient Active Problem List   Diagnosis Date Noted  . Bipolar disorder, in full remission, most recent episode depressed (HCC) 01/18/2020  . Generalized anxiety disorder 01/18/2020  . Alcohol use disorder, severe, in early remission, dependence (HCC) 01/18/2020  . Cocaine use disorder, severe, in early remission (HCC) 01/18/2020  . Suicidal ideation   . 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 Cocaine use disorder, severe, dependence (HCC) 07/24/2019  . History of colon polyps 12/09/2018  . 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 APPENDECTOMY  age 44  . TONSILLECTOMY Bilateral age 22     OB History   No obstetric history on  file.     Family History  Problem Relation Age of Onset  . Asthma Mother   . COPD Mother   . Cancer Father        thyroid cancer    Social History   Tobacco Use  . Smoking status: Current Every Day Smoker    Packs/day: 0.50    Years: 35.00    Pack years: 17.50    Types: Cigarettes  . Smokeless tobacco: Never Used  Vaping Use  . Vaping Use: Never used  Substance Use Topics  . Alcohol use: Yes  . Drug use: Yes    Types: "Crack" cocaine, Cocaine    Comment: last used 4/31/22    Home Medications Prior to Admission medications   Medication Sig Start Date End Date Taking? Authorizing Provider  ARIPiprazole (ABILIFY) 10 MG tablet TAKE 1 TABLET(10 MG) BY MOUTH DAILY 10/15/20   Zena Amos, MD  benzonatate (TESSALON) 200 MG capsule Take 1 capsule (200 mg total) by mouth every 8 (eight) hours. 11/27/20   Dartha Lodge, PA-C  busPIRone (BUSPAR) 10 MG tablet TAKE 1 TABLET(10 MG) BY MOUTH THREE TIMES DAILY 10/29/20   Zena Amos, MD  ibuprofen (ADVIL) 200 MG tablet Take 600 mg by mouth every 6 (six) hours as needed for fever, headache or moderate pain.    [provider]  lamoTRIgine (LAMICTAL) 100 MG tablet  Take 1 tablet (100 mg total) by mouth 2 (two) times daily. 10/01/20   Zena Amos, MD  sertraline (ZOLOFT) 50 MG tablet Take 1 tablet (50 mg total) by mouth daily. 10/01/20   Zena Amos, MD    Allergies    Patient has no known allergies.  Review of Systems   Review of Systems  All other systems reviewed and are negative.   Physical Exam Updated Vital Signs BP (!) 181/145 (BP Location: Right Arm)   Pulse (!) 107   Temp 97.9 F (36.6 C)   Resp (!) 22   Ht 5\' 3"  (1.6 m)   Wt 52.2 kg   SpO2 98%   BMI 20.37 kg/m   Physical Exam Vitals and nursing note reviewed.  Constitutional:      General: She is not in acute distress.    Appearance: She is well-developed. She is not diaphoretic.  HENT:     Head: Normocephalic and atraumatic.  Cardiovascular:      Rate and Rhythm: Normal rate and regular rhythm.     Heart sounds: No murmur heard. No friction rub. No gallop.   Pulmonary:     Effort: Pulmonary effort is normal. No respiratory distress.     Breath sounds: Normal breath sounds. No wheezing.  Abdominal:     General: Bowel sounds are normal. There is no distension.     Palpations: Abdomen is soft.     Tenderness: There is no abdominal tenderness.  Musculoskeletal:        General: Tenderness present. Normal range of motion.     Cervical back: Normal range of motion and neck supple.     Comments: The left ankle appears grossly normal, but there is slight erythema to the medial aspect of the left foot.  She has good range of motion without pain.  There is mild calf tenderness, but no swelling.  ' sign is equivocal.  Skin:    General: Skin is warm and dry.  Neurological:     Mental Status: She is alert and oriented to person, place, and time.     ED Results / Procedures / Treatments   Labs (all labs ordered are listed, but only abnormal results are displayed) Labs Reviewed  BASIC METABOLIC PANEL  CBC WITH DIFFERENTIAL/PLATELET  D-DIMER, QUANTITATIVE    EKG None  Radiology No results found.  Procedures Procedures   Medications Ordered in ED Medications  oxyCODONE-acetaminophen (PERCOCET/ROXICET) 5-325 MG per tablet 2 tablet (has no administration in time range)    ED Course  I have reviewed the triage vital signs and the nursing notes.  Pertinent labs & imaging results that were available during my care of the patient were reviewed by me and considered in my medical decision making (see chart for details).    MDM Rules/Calculators/A&P  Patient presenting here with complaints of left ankle pain that began 2 days ago in the absence of any injury or trauma.  Her pain is rapidly worsening.  Patient is afebrile with no white count.  She has good range of motion of the ankle, but exquisite tenderness with palpation to  the medial aspect of the heel.  There is some erythema to this area and cellulitis is a possibility.  She does have a mildly elevated D-dimer for which an ultrasound will be obtained as an outpatient.  Patient to be discharged with crutches, Cam walker, Keflex, and pain medicine.  She is to return as needed if symptoms worsen.  Final Clinical Impression(s) /  ED Diagnoses Final diagnoses:  None    Rx / DC Orders ED Discharge Orders    None       Geoffery Lyons, MD 01/01/21 [phone]

## 2021-01-01 NOTE — ED Provider Notes (Signed)
[facility] EMERGENCY DEPARTMENT Provider Note   CSN: [id number] Arrival date & time: 01/01/21  0441     History Chief Complaint  Patient presents with  . Alcohol Problem    Kristina Owens is a 55 y.o. female.  Patient with past medical history of anxiety, depression, GERD.  Patient presenting today for evaluation of nausea which she believes is related to alcohol withdrawal.  Patient was just seen and discharged approximately 1 hour prior to this presentation by myself.  She presented with complaints of severe pain in her foot/ankle because of which is possible cellulitis.  During this visit, she mentioned nothing of alcohol withdrawal or desire to stop drinking. Again this visit was completed just over 1 hour ago.  Patient tells me she drinks daily.  She last consumed alcohol at approximately midnight tonight.  She complains of feeling very nauseated and believes she is dehydrated.  The history is provided by the patient.       Past Medical History:  Diagnosis Date  . Anxiety   . Depression   . GERD (gastroesophageal reflux disease)   . Ovarian cyst     Patient Active Problem List   Diagnosis Date Noted  . Bipolar disorder, in full remission, most recent episode depressed (HCC) 01/18/2020  . Generalized anxiety disorder 01/18/2020  . Alcohol use disorder, severe, in early remission, dependence (HCC) 01/18/2020  . Cocaine use disorder, severe, in early remission (HCC) 01/18/2020  . Suicidal ideation   . 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 Cocaine use disorder, severe, dependence (HCC) 07/24/2019  . History of colon polyps 12/09/2018  . 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 APPENDECTOMY  age 54  . TONSILLECTOMY Bilateral age 78     OB History   No obstetric history on file.     Family History  Problem Relation Age of Onset  . Asthma Mother   . COPD Mother   .  Cancer Father        thyroid cancer    Social History   Tobacco Use  . Smoking status: Current Every Day Smoker    Packs/day: 0.50    Years: 35.00    Pack years: 17.50    Types: Cigarettes  . Smokeless tobacco: Never Used  Vaping Use  . Vaping Use: Never used  Substance Use Topics  . Alcohol use: Yes  . Drug use: Yes    Types: "Crack" cocaine, Cocaine    Comment: last used 4/31/22    Home Medications Prior to Admission medications   Medication Sig Start Date End Date Taking? Authorizing Provider  cephALEXin (KEFLEX) 500 MG capsule Take 1 capsule (500 mg total) by mouth 4 (four) times daily. 01/01/21  Yes Irvin Bastin, Riley Lam, MD  HYDROcodone-acetaminophen (NORCO) 5-325 MG tablet Take 1-2 tablets by mouth every 6 (six) hours as needed. 01/01/21  Yes Jack Bolio, Riley Lam, MD  ARIPiprazole (ABILIFY) 10 MG tablet TAKE 1 TABLET(10 MG) BY MOUTH DAILY Patient not taking: Reported on 01/01/2021 10/15/20   Zena Amos, MD  benzonatate (TESSALON) 200 MG capsule Take 1 capsule (200 mg total) by mouth every 8 (eight) hours. Patient not taking: Reported on 01/01/2021 11/27/20   Dartha Lodge, PA-C  busPIRone (BUSPAR) 10 MG tablet TAKE 1 TABLET(10 MG) BY MOUTH THREE TIMES DAILY Patient not taking: Reported on 01/01/2021 10/29/20   Zena Amos, MD  ibuprofen (ADVIL) 200 MG tablet Take 200 mg by mouth every  6 (six) hours as needed for headache or moderate pain.    [provider]  lamoTRIgine (LAMICTAL) 100 MG tablet Take 1 tablet (100 mg total) by mouth 2 (two) times daily. Patient not taking: Reported on 01/01/2021 10/01/20   Zena Amos, MD  sertraline (ZOLOFT) 50 MG tablet Take 1 tablet (50 mg total) by mouth daily. Patient not taking: Reported on 01/01/2021 10/01/20   Zena Amos, MD    Allergies    Patient has no known allergies.  Review of Systems   Review of Systems  All other systems reviewed and are negative.   Physical Exam Updated Vital Signs BP 91/68 (BP Location: Right Arm)    Pulse 87   Temp (!) 97.4 F (36.3 C) (Oral)   Resp 20   SpO2 96%   Physical Exam Vitals and nursing note reviewed.  Constitutional:      General: She is not in acute distress.    Appearance: She is well-developed. She is not diaphoretic.  HENT:     Head: Normocephalic and atraumatic.  Cardiovascular:     Rate and Rhythm: Normal rate and regular rhythm.     Heart sounds: No murmur heard. No friction rub. No gallop.   Pulmonary:     Effort: Pulmonary effort is normal. No respiratory distress.     Breath sounds: Normal breath sounds. Stridor:   No wheezing.  Abdominal:     General: Bowel sounds are normal. There is no distension.     Palpations: Abdomen is soft.     Tenderness: There is no abdominal tenderness.  Musculoskeletal:        General: Normal range of motion.     Cervical back: Normal range of motion and neck supple.  Skin:    General: Skin is warm and dry.  Neurological:     Mental Status: She is alert and oriented to person, place, and time.     Cranial Nerves: Cranial nerve deficit:       ED Results / Procedures / Treatments   Labs (all labs ordered are listed, but only abnormal results are displayed) Labs Reviewed - No data to display  EKG None  Radiology DG Foot Complete Left  Result Date: 01/01/2021 CLINICAL DATA:  Left ankle and foot pain, worse with flexion extension EXAM: LEFT FOOT - COMPLETE 3+ VIEW COMPARISON:  None. FINDINGS: Frontal, oblique, and lateral views of the left foot are obtained. No fracture, subluxation, or dislocation. There is mild joint space narrowing of the first metatarsophalangeal joint. Remaining joint spaces are unremarkable. The soft tissues are normal. IMPRESSION: 1. Mild osteoarthritis of the first metatarsophalangeal joint. Otherwise unremarkable left foot. Electronically Signed   By: Sharlet Salina M.D.   On: 01/01/2021 02:00    Procedures Procedures   Medications Ordered in ED Medications  ondansetron (ZOFRAN-ODT)  disintegrating tablet 8 mg (has no administration in time range)    ED Course  I have reviewed the triage vital signs and the nursing notes.  Pertinent labs & imaging results that were available during my care of the patient were reviewed by me and considered in my medical decision making (see chart for details).    MDM Rules/Calculators/A&P  Patient seen now for the second time this evening.  The initial presentation was for ankle/foot pain and this presentation is for alcohol withdrawal.  Patient's vitals are stable and she does not appear shaky or tremulous.  I see no sign of DTs or active withdrawal.  She complains of feeling  extremely nauseated.  Patient will be given Zofran and at this point I believe is stable for discharge.  She is concerned she is dehydrated, however her electrolytes do not reflect this.  She does express a desire to stop drinking.  She will be given the resource guide for alcohol treatment centers that she can call and make these arrangements.  Final Clinical Impression(s) / ED Diagnoses Final diagnoses:  None    Rx / DC Orders ED Discharge Orders    None       Geoffery Lyons, MD 01/01/21 [phone]

## 2021-01-01 NOTE — ED Triage Notes (Signed)
Pt c/o left lower ankle throbbing pain radiates down to foot and up to calf. Pain in lower leg worsens with flexion/extension. Pt unsure of injury. Pedial Pulses equal.

## 2021-01-01 NOTE — ED Notes (Signed)
Pt states she drinks a pint of Vodka a day, last drink was around Midnight Denies hx of withdrawal related seizures.   Pt c/o N/V, weakness and sweating.

## 2021-01-01 NOTE — ED Notes (Signed)
Patient verbalizes understanding of discharge instructions. Opportunity for questioning and answers were provided. Armband removed by staff, pt discharged from ED ambulatory with crutches. ° °

## 2021-01-08 ENCOUNTER — Inpatient Hospital Stay (HOSPITAL_COMMUNITY)
Admission: EM | Admit: 2021-01-08 | Discharge: 2021-01-18 | DRG: 853 | Disposition: A | Discharge status: Home / Self Care | Payer: Self-pay | Attending: Internal Medicine | Admitting: Internal Medicine

## 2021-01-08 ENCOUNTER — Encounter (HOSPITAL_COMMUNITY): Payer: Self-pay | Admitting: Emergency Medicine

## 2021-01-08 ENCOUNTER — Other Ambulatory Visit: Payer: Self-pay

## 2021-01-08 ENCOUNTER — Emergency Department (HOSPITAL_COMMUNITY): Payer: Self-pay

## 2021-01-08 ENCOUNTER — Emergency Department (HOSPITAL_COMMUNITY)
Admit: 2021-01-08 | Discharge: 2021-01-08 | Disposition: A | Discharge status: Home / Self Care | Payer: Self-pay | Attending: Emergency Medicine | Admitting: Emergency Medicine

## 2021-01-08 DIAGNOSIS — F10930 Alcohol use, unspecified with withdrawal, uncomplicated: Secondary | ICD-10-CM

## 2021-01-08 DIAGNOSIS — M7989 Other specified soft tissue disorders: Secondary | ICD-10-CM

## 2021-01-08 DIAGNOSIS — B9620 Unspecified Escherichia coli [E. coli] as the cause of diseases classified elsewhere: Secondary | ICD-10-CM | POA: Diagnosis present

## 2021-01-08 DIAGNOSIS — N390 Urinary tract infection, site not specified: Secondary | ICD-10-CM | POA: Diagnosis present

## 2021-01-08 DIAGNOSIS — R7989 Other specified abnormal findings of blood chemistry: Secondary | ICD-10-CM | POA: Diagnosis present

## 2021-01-08 DIAGNOSIS — G8929 Other chronic pain: Secondary | ICD-10-CM | POA: Diagnosis present

## 2021-01-08 DIAGNOSIS — E876 Hypokalemia: Secondary | ICD-10-CM | POA: Diagnosis not present

## 2021-01-08 DIAGNOSIS — I82442 Acute embolism and thrombosis of left tibial vein: Secondary | ICD-10-CM | POA: Diagnosis present

## 2021-01-08 DIAGNOSIS — F10139 Alcohol abuse with withdrawal, unspecified: Secondary | ICD-10-CM

## 2021-01-08 DIAGNOSIS — I82409 Acute embolism and thrombosis of unspecified deep veins of unspecified lower extremity: Secondary | ICD-10-CM

## 2021-01-08 DIAGNOSIS — Z825 Family history of asthma and other chronic lower respiratory diseases: Secondary | ICD-10-CM

## 2021-01-08 DIAGNOSIS — I871 Compression of vein: Secondary | ICD-10-CM | POA: Diagnosis present

## 2021-01-08 DIAGNOSIS — A419 Sepsis, unspecified organism: Principal | ICD-10-CM | POA: Diagnosis present

## 2021-01-08 DIAGNOSIS — F411 Generalized anxiety disorder: Secondary | ICD-10-CM

## 2021-01-08 DIAGNOSIS — I2602 Saddle embolus of pulmonary artery with acute cor pulmonale: Secondary | ICD-10-CM

## 2021-01-08 DIAGNOSIS — K219 Gastro-esophageal reflux disease without esophagitis: Secondary | ICD-10-CM | POA: Diagnosis present

## 2021-01-08 DIAGNOSIS — Z5900 Homelessness unspecified: Secondary | ICD-10-CM

## 2021-01-08 DIAGNOSIS — F1920 Other psychoactive substance dependence, uncomplicated: Secondary | ICD-10-CM

## 2021-01-08 DIAGNOSIS — J189 Pneumonia, unspecified organism: Secondary | ICD-10-CM

## 2021-01-08 DIAGNOSIS — I82402 Acute embolism and thrombosis of unspecified deep veins of left lower extremity: Secondary | ICD-10-CM

## 2021-01-08 DIAGNOSIS — I82412 Acute embolism and thrombosis of left femoral vein: Secondary | ICD-10-CM | POA: Diagnosis present

## 2021-01-08 DIAGNOSIS — I82462 Acute embolism and thrombosis of left calf muscular vein: Secondary | ICD-10-CM | POA: Diagnosis present

## 2021-01-08 DIAGNOSIS — Z808 Family history of malignant neoplasm of other organs or systems: Secondary | ICD-10-CM

## 2021-01-08 DIAGNOSIS — I2699 Other pulmonary embolism without acute cor pulmonale: Secondary | ICD-10-CM | POA: Diagnosis present

## 2021-01-08 DIAGNOSIS — M549 Dorsalgia, unspecified: Secondary | ICD-10-CM | POA: Diagnosis present

## 2021-01-08 DIAGNOSIS — F1021 Alcohol dependence, in remission: Secondary | ICD-10-CM

## 2021-01-08 DIAGNOSIS — Z79899 Other long term (current) drug therapy: Secondary | ICD-10-CM

## 2021-01-08 DIAGNOSIS — Z20822 Contact with and (suspected) exposure to covid-19: Secondary | ICD-10-CM | POA: Diagnosis present

## 2021-01-08 DIAGNOSIS — F1020 Alcohol dependence, uncomplicated: Secondary | ICD-10-CM

## 2021-01-08 DIAGNOSIS — I2692 Saddle embolus of pulmonary artery without acute cor pulmonale: Secondary | ICD-10-CM

## 2021-01-08 DIAGNOSIS — I824Y2 Acute embolism and thrombosis of unspecified deep veins of left proximal lower extremity: Secondary | ICD-10-CM

## 2021-01-08 DIAGNOSIS — F3176 Bipolar disorder, in full remission, most recent episode depressed: Secondary | ICD-10-CM

## 2021-01-08 DIAGNOSIS — I82432 Acute embolism and thrombosis of left popliteal vein: Secondary | ICD-10-CM | POA: Diagnosis present

## 2021-01-08 DIAGNOSIS — F10239 Alcohol dependence with withdrawal, unspecified: Secondary | ICD-10-CM | POA: Diagnosis present

## 2021-01-08 DIAGNOSIS — Z9114 Patient's other noncompliance with medication regimen: Secondary | ICD-10-CM

## 2021-01-08 DIAGNOSIS — I82452 Acute embolism and thrombosis of left peroneal vein: Secondary | ICD-10-CM | POA: Diagnosis present

## 2021-01-08 DIAGNOSIS — F17210 Nicotine dependence, cigarettes, uncomplicated: Secondary | ICD-10-CM | POA: Diagnosis present

## 2021-01-08 DIAGNOSIS — F1410 Cocaine abuse, uncomplicated: Secondary | ICD-10-CM | POA: Diagnosis present

## 2021-01-08 LAB — MAGNESIUM: Magnesium: 2 mg/dL (ref 1.7–2.4)

## 2021-01-08 LAB — DIFFERENTIAL
Abs Immature Granulocytes: 0.16 10*3/uL — ABNORMAL HIGH (ref 0.00–0.07)
Basophils Absolute: 0 10*3/uL (ref 0.0–0.1)
Basophils Relative: 1 %
Eosinophils Absolute: 0.1 10*3/uL (ref 0.0–0.5)
Eosinophils Relative: 1 %
Immature Granulocytes: 2 %
Lymphocytes Relative: 25 %
Lymphs Abs: 1.9 10*3/uL (ref 0.7–4.0)
Monocytes Absolute: 1 10*3/uL (ref 0.1–1.0)
Monocytes Relative: 13 %
Neutro Abs: 4.6 10*3/uL (ref 1.7–7.7)
Neutrophils Relative %: 58 %

## 2021-01-08 LAB — BASIC METABOLIC PANEL
Anion gap: 11 (ref 5–15)
BUN: <5 mg/dL — ABNORMAL LOW (ref 6–20)
CO2: 28 mmol/L (ref 22–32)
Calcium: 8.8 mg/dL — ABNORMAL LOW (ref 8.9–10.3)
Chloride: 100 mmol/L (ref 98–111)
Creatinine, Ser: 0.75 mg/dL (ref 0.44–1.00)
GFR, Estimated: >60 mL/min (ref >60)
Glucose, Bld: 112 mg/dL — ABNORMAL HIGH (ref 70–99)
Potassium: 3.8 mmol/L (ref 3.5–5.1)
Sodium: 139 mmol/L (ref 135–145)

## 2021-01-08 LAB — CBC
HCT: 48.1 % — ABNORMAL HIGH (ref 36.0–46.0)
Hemoglobin: 15.3 g/dL — ABNORMAL HIGH (ref 12.0–15.0)
MCH: 30 pg (ref 26.0–34.0)
MCHC: 31.8 g/dL (ref 30.0–36.0)
MCV: 94.3 fL (ref 80.0–100.0)
Platelets: 170 10*3/uL (ref 150–400)
RBC: 5.1 MIL/uL (ref 3.87–5.11)
RDW: 17.3 % — ABNORMAL HIGH (ref 11.5–15.5)
WBC: 7.9 10*3/uL (ref 4.0–10.5)
nRBC: 0 % (ref 0.0–0.2)

## 2021-01-08 LAB — HIV ANTIBODY (ROUTINE TESTING W REFLEX): HIV Screen 4th Generation wRfx: NONREACTIVE

## 2021-01-08 LAB — RESP PANEL BY RT-PCR (FLU A&B, COVID) ARPGX2
Influenza A by PCR: NEGATIVE
Influenza B by PCR: NEGATIVE
SARS Coronavirus 2 by RT PCR: NEGATIVE

## 2021-01-08 LAB — BRAIN NATRIURETIC PEPTIDE: B Natriuretic Peptide: 39.9 pg/mL (ref 0.0–100.0)

## 2021-01-08 LAB — TROPONIN I (HIGH SENSITIVITY): Troponin I (High Sensitivity): 10 ng/L (ref <18)

## 2021-01-08 LAB — PROTIME-INR
INR: 1.1 (ref 0.8–1.2)
Prothrombin Time: 13.8 seconds (ref 11.4–15.2)

## 2021-01-08 MED ORDER — HYDROMORPHONE HCL 1 MG/ML IJ SOLN
1.0000 mg | Freq: Once | INTRAMUSCULAR | Status: AC
  Administered 2021-01-08: 1 mg via INTRAVENOUS
  Filled 2021-01-08: qty 1

## 2021-01-08 MED ORDER — ENOXAPARIN SODIUM 60 MG/0.6ML IJ SOSY
50.0000 mg | PREFILLED_SYRINGE | INTRAMUSCULAR | Status: DC
  Filled 2021-01-08: qty 0.5

## 2021-01-08 MED ORDER — ACETAMINOPHEN 650 MG RE SUPP: 650.0000 mg | Freq: Four times a day (QID) | RECTAL | Status: DC | PRN

## 2021-01-08 MED ORDER — ADULT MULTIVITAMIN W/MINERALS CH
1.0000 | ORAL_TABLET | Freq: Every day | ORAL | Status: DC
  Administered 2021-01-08 – 2021-01-18 (×11): 1 via ORAL
  Filled 2021-01-08 (×11): qty 1

## 2021-01-08 MED ORDER — SODIUM CHLORIDE (PF) 0.9 % IJ SOLN
INTRAMUSCULAR | Status: AC
  Filled 2021-01-08: qty 50

## 2021-01-08 MED ORDER — ENOXAPARIN SODIUM 60 MG/0.6ML IJ SOSY
50.0000 mg | PREFILLED_SYRINGE | Freq: Two times a day (BID) | INTRAMUSCULAR | Status: DC
  Filled 2021-01-08: qty 0.5

## 2021-01-08 MED ORDER — THIAMINE HCL 100 MG/ML IJ SOLN
100.0000 mg | Freq: Every day | INTRAMUSCULAR | Status: DC
  Filled 2021-01-08: qty 2

## 2021-01-08 MED ORDER — ACETAMINOPHEN 325 MG PO TABS
650.0000 mg | ORAL_TABLET | Freq: Four times a day (QID) | ORAL | Status: DC | PRN
  Administered 2021-01-10 – 2021-01-17 (×3): 650 mg via ORAL
  Filled 2021-01-08 (×3): qty 2

## 2021-01-08 MED ORDER — HYDROCODONE-ACETAMINOPHEN 5-325 MG PO TABS
1.0000 | ORAL_TABLET | ORAL | Status: DC | PRN
  Administered 2021-01-08 – 2021-01-15 (×25): 1 via ORAL
  Filled 2021-01-08 (×27): qty 1

## 2021-01-08 MED ORDER — FOLIC ACID 1 MG PO TABS
1.0000 mg | ORAL_TABLET | Freq: Every day | ORAL | Status: DC
  Administered 2021-01-08 – 2021-01-18 (×11): 1 mg via ORAL
  Filled 2021-01-08 (×11): qty 1

## 2021-01-08 MED ORDER — MORPHINE SULFATE (PF) 2 MG/ML IV SOLN
2.0000 mg | INTRAVENOUS | Status: DC | PRN
Start: 2021-01-08 — End: 2021-01-18
  Administered 2021-01-09 – 2021-01-18 (×23): 2 mg via INTRAVENOUS
  Filled 2021-01-08 (×25): qty 1

## 2021-01-08 MED ORDER — THIAMINE HCL 100 MG PO TABS
100.0000 mg | ORAL_TABLET | Freq: Every day | ORAL | Status: DC
  Administered 2021-01-08 – 2021-01-18 (×11): 100 mg via ORAL
  Filled 2021-01-08 (×11): qty 1

## 2021-01-08 MED ORDER — POLYETHYLENE GLYCOL 3350 17 G PO PACK: 17.0000 g | PACK | Freq: Every day | ORAL | Status: DC | PRN

## 2021-01-08 MED ORDER — IOHEXOL 350 MG/ML SOLN
80.0000 mL | Freq: Once | INTRAVENOUS | Status: AC | PRN
  Administered 2021-01-08: 75 mL via INTRAVENOUS

## 2021-01-08 MED ORDER — HYDROCODONE-ACETAMINOPHEN 5-325 MG PO TABS
1.0000 | ORAL_TABLET | Freq: Once | ORAL | Status: AC
  Administered 2021-01-08: 1 via ORAL
  Filled 2021-01-08: qty 1

## 2021-01-08 MED ORDER — SODIUM CHLORIDE 0.9% FLUSH
3.0000 mL | Freq: Two times a day (BID) | INTRAVENOUS | Status: DC
  Administered 2021-01-09 – 2021-01-17 (×10): 3 mL via INTRAVENOUS

## 2021-01-08 MED ORDER — LORAZEPAM 2 MG/ML IJ SOLN
1.0000 mg | INTRAMUSCULAR | Status: AC | PRN
  Administered 2021-01-09 – 2021-01-11 (×9): 2 mg via INTRAVENOUS
  Filled 2021-01-08 (×11): qty 1

## 2021-01-08 MED ORDER — SODIUM CHLORIDE 0.9 % IV BOLUS
1000.0000 mL | Freq: Once | INTRAVENOUS | Status: AC
  Administered 2021-01-08: 1000 mL via INTRAVENOUS

## 2021-01-08 MED ORDER — LORAZEPAM 1 MG PO TABS
1.0000 mg | ORAL_TABLET | ORAL | Status: AC | PRN
  Administered 2021-01-08: 1 mg via ORAL
  Administered 2021-01-08 – 2021-01-09 (×3): 2 mg via ORAL
  Administered 2021-01-09: 1 mg via ORAL
  Administered 2021-01-10 (×2): 2 mg via ORAL
  Filled 2021-01-08: qty 2
  Filled 2021-01-08: qty 1
  Filled 2021-01-08 (×2): qty 2
  Filled 2021-01-08: qty 1
  Filled 2021-01-08 (×2): qty 2

## 2021-01-08 MED ORDER — ENOXAPARIN SODIUM 80 MG/0.8ML IJ SOSY
70.0000 mg | PREFILLED_SYRINGE | Freq: Two times a day (BID) | INTRAMUSCULAR | Status: DC
  Administered 2021-01-08 – 2021-01-09 (×3): 70 mg via SUBCUTANEOUS
  Filled 2021-01-08 (×2): qty 0.7
  Filled 2021-01-08 (×2): qty 0.8

## 2021-01-08 NOTE — TOC Benefit Eligibility Note (Signed)
Transition of Care [facility]) Benefit Eligibility Note    Patient Details  Name: Kristina Owens MRN: [id number] Date of Birth: Mar 31, 1966   Medication/Dose: Carlena Hurl 10 MG DAILY   and   ELIQUIS  5 MG BID        Prescription Coverage Preferred Pharmacy: [facility]              Additional Notes: NO PHARMACY [place] [address] Phone Number: 01/08/2021, 3:55 PM

## 2021-01-08 NOTE — Progress Notes (Signed)
Left lower extremity venous duplex has been completed. Preliminary results can be found in CV Proc through chart review.  Results were given to Dr. Charm Barges.  01/08/21 9:05 AM Olen Cordial RVT

## 2021-01-08 NOTE — ED Notes (Signed)
Verified with pharmacy pt's weight is 150lbs.

## 2021-01-08 NOTE — ED Triage Notes (Signed)
Per pt, states left ankle pain radiating up calf and into thigh-symptoms for awhile-states she is currently on an antibiotic for issue-states using ETOH, meth and crack-last use 3 hours ago-wants detox

## 2021-01-08 NOTE — ED Notes (Signed)
Hospitalist at the bedside 

## 2021-01-08 NOTE — ED Provider Notes (Addendum)
Emergency Medicine Provider Triage Evaluation Note  [facility] , a 55 y.o. female  was evaluated in triage.  Pt complains of left ankle and calf pain, needs detox.  Review of Systems  Positive: Muscular pain, SOB Negative: No fever no trauma  Physical Exam  BP 130/90 (BP Location: Right Arm)   Pulse (!) 130   Temp 98.4 F (36.9 C) (Oral)   Resp 16   SpO2 100%  Gen:   Awake, no distress  Resp:  Normal effort  MSK:   Moves extremities, pain left ankle and calf, distal pulses intact Other:  Tachycardic  Medical Decision Making  Medically screening exam initiated at 8:37 AM.  Appropriate orders placed.  Shadawn [place] was informed that the remainder of the evaluation will be completed by another provider, this initial triage assessment does not replace that evaluation, and the importance of remaining in the ED until their evaluation is complete.  Placed order for duplex left lower extremity, ordered some pain medication, consult for peer support   Terrilee Files, MD 01/08/21 [phone]  Addendum - positive for DVT. Labs ordered, patient updated   Terrilee Files, MD 01/08/21 [phone]

## 2021-01-08 NOTE — Progress Notes (Signed)
TOC CM sent benefit check for Xarelto and Eliquis. Isidoro Donning RN CCM, [facility] ED TOC CM [phone]

## 2021-01-08 NOTE — ED Notes (Signed)
Pt departed the dept with ED tech transporting via stretcher.

## 2021-01-08 NOTE — H&P (Signed)
History and Physical        Hospital Admission Note Date: 01/08/2021  Patient name: Kristina Owens Medical record number: [id number] Date of birth: [birth year] Age: 55 y.o. Gender: female  PCP: Greer Ee., FNP   Chief Complaint    Chief Complaint  Patient presents with  . Leg Pain  . detox      HPI:   This is a 55 year old female with a history of anxiety, depression, GERD, bipolar, polysubstance abuse, alcohol abuse who presented to the ED with two weeks of left leg pain. She was in the ED on 5/31 for alcohol withdrawal and at the time noted left ankle pain. She had a D dimer 1.3 and was advised to get an outpatient [place] but this did not happen. She has also had 3 days of shortness of breath but no chest pain and has a chronic cough. Associated with swelling and pain with bearing weight.  Most recently used crack cocaine and methamphetamine and alcohol yesterday.  No IV drug use.  She does have a history of tremor and night sweats from withdrawal.  She is seeking to going to rehab once she is discharged from. She denies any history of cancer, VTE, OCP use, recent surgery, recent travel but she does smoke less than 1 pack/day of cigarettes.   ED Course: Afebrile, tachycardic, hemodynamically stable, on room air. Labs overall unremarkable, COVID 19 pending. Notable Imaging: Bilateral LE Doppler - Positive LLE acute DVT involving the left common femoral, left femoral, popliteal, posterior tibial, peroneal, soleal and gastrocnemius veins. CTA chest - multiple bilateral PE including saddle embolus with CT evidence of right heart strain. Patient received Dilaudid, Norco, 1L NS bolus and will be started on Lovenox. ED provider discussed with PCCM who felt a hospitalist admission was appropriate and would consult if needed.    Vitals:   01/08/21 0950 01/08/21 1207  BP:  (!)  124/91  Pulse: (!) 115 99  Resp: 16 15  Temp:    SpO2: 96% 98%     Review of Systems:  Review of Systems  All other systems reviewed and are negative.   Medical/Social/Family History   Past Medical History: Past Medical History:  Diagnosis Date  . Anxiety   . Depression   . GERD (gastroesophageal reflux disease)   . Ovarian cyst     Past Surgical History:  Procedure Laterality Date  . APPENDECTOMY  age 30  . TONSILLECTOMY Bilateral age 55    Medications: Prior to Admission medications   Medication Sig Start Date End Date Taking? Authorizing Provider  ARIPiprazole (ABILIFY) 10 MG tablet TAKE 1 TABLET(10 MG) BY MOUTH DAILY Patient not taking: Reported on 01/01/2021 10/15/20   Zena Amos, MD  benzonatate (TESSALON) 200 MG capsule Take 1 capsule (200 mg total) by mouth every 8 (eight) hours. Patient not taking: Reported on 01/01/2021 11/27/20   Dartha Lodge, PA-C  busPIRone (BUSPAR) 10 MG tablet TAKE 1 TABLET(10 MG) BY MOUTH THREE TIMES DAILY Patient not taking: Reported on 01/01/2021 10/29/20   Zena Amos, MD  cephALEXin (KEFLEX) 500 MG capsule Take 1 capsule (500 mg total) by mouth 4 (four) times daily. 01/01/21   Geoffery Lyons, MD  HYDROcodone-acetaminophen (NORCO) 5-325 MG tablet Take 1-2 tablets by mouth every 6 (six) hours as needed. 01/01/21   Geoffery Lyons, MD  ibuprofen (ADVIL) 200 MG tablet Take 200 mg by mouth every 6 (six) hours as needed for headache or moderate pain.    [provider]  lamoTRIgine (LAMICTAL) 100 MG tablet Take 1 tablet (100 mg total) by mouth 2 (two) times daily. Patient not taking: Reported on 01/01/2021 10/01/20   Zena Amos, MD  sertraline (ZOLOFT) 50 MG tablet Take 1 tablet (50 mg total) by mouth daily. Patient not taking: Reported on 01/01/2021 10/01/20   Zena Amos, MD    Allergies:  No Known Allergies  Social History:  reports that she has been smoking cigarettes. She has a 17.50 pack-year smoking history. She has never  used smokeless tobacco. She reports current alcohol use. She reports current drug use. Drugs: "Crack" cocaine, Cocaine, and Methamphetamines.  Family History: Family History  Problem Relation Age of Onset  . Asthma Mother   . COPD Mother   . Cancer Father        thyroid cancer     Objective   Physical Exam: Blood pressure (!) 124/91, pulse 99, temperature 98.4 F (36.9 C), temperature source Oral, resp. rate 15, weight 68 kg, SpO2 98 %.  Physical Exam Vitals and nursing note reviewed.  Constitutional:      Appearance: Normal appearance.  HENT:     Head: Normocephalic and atraumatic.  Eyes:     Conjunctiva/sclera: Conjunctivae normal.  Cardiovascular:     Rate and Rhythm: Regular rhythm. Tachycardia present.  Pulmonary:     Effort: Pulmonary effort is normal.     Breath sounds: Normal breath sounds.  Abdominal:     General: Abdomen is flat.     Palpations: Abdomen is soft.  Musculoskeletal:     Comments: Trace left lower extremity swelling  Skin:    Coloration: Skin is not jaundiced or pale.  Neurological:     Mental Status: She is alert. Mental status is at baseline.  Psychiatric:        Mood and Affect: Mood normal.        Behavior: Behavior normal.     LABS on Admission: I have personally reviewed all the labs and imaging below    Basic Metabolic Panel: Recent Labs  Lab 01/08/21 0915  NA 139  K 3.8  CL 100  CO2 28  GLUCOSE 112*  BUN <5*  CREATININE 0.75  CALCIUM 8.8*   Liver Function Tests: No results for input(s): AST, ALT, ALKPHOS, BILITOT, PROT, ALBUMIN in the last 168 hours. No results for input(s): LIPASE, AMYLASE in the last 168 hours. No results for input(s): AMMONIA in the last 168 hours. CBC: Recent Labs  Lab 01/08/21 1220  WBC 7.9  NEUTROABS 4.6  HGB 15.3*  HCT 48.1*  MCV 94.3  PLT 170   Cardiac Enzymes: No results for input(s): CKTOTAL, CKMB, CKMBINDEX, TROPONINI in the last 168 hours. BNP: Invalid input(s): POCBNP CBG: No  results for input(s): GLUCAP in the last 168 hours.  Radiological Exams on Admission:  CT Angio Chest PE W/Cm &/Or Wo Cm  Result Date: 01/08/2021 CLINICAL DATA:  Positive for DVT today, leg pain, clinically suspected pulmonary embolism EXAM: CT ANGIOGRAPHY CHEST WITH CONTRAST TECHNIQUE: Multidetector CT imaging of the chest was performed using the standard protocol during bolus administration of intravenous contrast. Multiplanar CT image reconstructions and MIPs were obtained to evaluate the vascular anatomy. CONTRAST:  59mL OMNIPAQUE IOHEXOL  350 MG/ML SOLN IV COMPARISON:  None FINDINGS: Cardiovascular: Aorta normal caliber without aneurysm or dissection. Heart unremarkable. No pericardial effusion. Pulmonary arteries adequately opacified. Multiple filling defects are seen within the pulmonary arteries bilaterally consistent with pulmonary emboli. These include saddle embolus at the bifurcation of the RIGHT pulmonary artery, saddle embolus at bifurcation of RIGHT middle and RIGHT lower lobe pulmonary arteries, and within BILATERAL lower lobes and RIGHT middle lobe. RV/LV ratio = 1.01, elevated, consistent with RIGHT heart strain. Mediastinum/Nodes: Esophagus unremarkable. Base of cervical region normal appearance. No thoracic adenopathy. Lungs/Pleura: Scattered atelectasis. Small foci of infiltrate RIGHT upper lobe laterally and in lingula. No additional consolidation, pleural effusion, or pneumothorax. Upper Abdomen: Visualized upper abdomen unremarkable Musculoskeletal: No acute osseous findings. Review of the MIP images confirms the above findings. IMPRESSION: Multiple BILATERAL pulmonary emboli including saddle embolus at the bifurcation of the RIGHT pulmonary artery, saddle embolus at bifurcation of RIGHT middle and RIGHT lower lobe pulmonary arteries, and within BILATERAL lower lobes and RIGHT middle lobe. Positive for acute PE with CT evidence of right heart strain (RV/LV Ratio = 1.01) consistent with at  least submassive (intermediate risk) PE. The presence of right heart strain has been associated with an increased risk of morbidity and mortality. Please refer to the "PE Focused" order set in EPIC. Scattered atelectasis with small foci of infiltrate in RIGHT upper lobe laterally and in lingula. Critical Value/emergent results were called by telephone at the time of interpretation on 01/08/2021 at 10:52 am to provider Meridee Score MD, who verbally acknowledged these results. Electronically Signed   By: Ulyses Southward M.D.   On: 01/08/2021 10:53   VAS [place] LOWER EXTREMITY VENOUS (DVT) (MC and WL 7a-7p)  Result Date: 01/08/2021  Lower Venous DVT Study Patient Name:  BRENLYNN FAKE  Date of Exam:   01/08/2021 Medical Rec #: [id number]          Accession #:    [id number] Date of Birth: July 24, 1966         Patient Gender: F Patient Age:   54Y Exam Location:  [facility] Procedure:      VAS [place] LOWER EXTREMITY VENOUS (DVT) Referring Phys: [id number] MICHAEL C BUTLER --------------------------------------------------------------------------------  Indications: Swelling.  Risk Factors: None identified. Comparison Study: No prior studies. Performing Technologist: Chanda Busing RVT  Examination Guidelines: A complete evaluation includes B-mode imaging, spectral Doppler, color Doppler, and power Doppler as needed of all accessible portions of each vessel. Bilateral testing is considered an integral part of a complete examination. Limited examinations for reoccurring indications may be performed as noted. The reflux portion of the exam is performed with the patient in reverse Trendelenburg.  +-----+---------------+---------+-----------+----------+--------------+ RIGHTCompressibilityPhasicitySpontaneityPropertiesThrombus Aging +-----+---------------+---------+-----------+----------+--------------+ CFV  Full           Yes      Yes                                  +-----+---------------+---------+-----------+----------+--------------+   +---------+---------------+---------+-----------+----------+--------------+ LEFT     CompressibilityPhasicitySpontaneityPropertiesThrombus Aging +---------+---------------+---------+-----------+----------+--------------+ CFV      Partial        No       No                   Acute          +---------+---------------+---------+-----------+----------+--------------+ SFJ      Full                                                        +---------+---------------+---------+-----------+----------+--------------+  FV Prox  None                                         Acute          +---------+---------------+---------+-----------+----------+--------------+ FV Mid   None           No       No                   Acute          +---------+---------------+---------+-----------+----------+--------------+ FV DistalNone           No       No                   Acute          +---------+---------------+---------+-----------+----------+--------------+ PFV      Full                                                        +---------+---------------+---------+-----------+----------+--------------+ POP      None           No       No                   Acute          +---------+---------------+---------+-----------+----------+--------------+ PTV      None                                         Acute          +---------+---------------+---------+-----------+----------+--------------+ PERO     None                                         Acute          +---------+---------------+---------+-----------+----------+--------------+ Soleal   None                                         Acute          +---------+---------------+---------+-----------+----------+--------------+ Gastroc  Partial                                      Acute           +---------+---------------+---------+-----------+----------+--------------+ EIV      Full           No       No                                  +---------+---------------+---------+-----------+----------+--------------+    Summary: RIGHT: - There is no evidence of deep vein thrombosis in the lower extremity.  LEFT: - Findings consistent with acute deep vein thrombosis involving the left common femoral vein, left femoral vein, left popliteal vein, left posterior tibial veins, left peroneal  veins, left soleal veins, and left gastrocnemius veins. - No cystic structure found in the popliteal fossa.  *See table(s) above for measurements and observations.    Preliminary       EKG: sinus tachycardia   A & P   Active Problems:   Pulmonary embolism (HCC)   DVT (deep venous thrombosis) (HCC)   Polysubstance (excluding opioids) dependence (HCC)   Alcohol abuse with withdrawal (HCC)   1. Acute unprovoked left lower extremity DVT  acute saddle PE with right heart strain a. Hemodynamically stable on room air b. Bilateral LE Doppler - Positive LLE acute DVT involving the left common femoral, left femoral, popliteal, posterior tibial, peroneal, soleal and gastrocnemius veins. CTA chest - multiple bilateral PE including saddle embolus with CT evidence of right heart strain c. Continue therapeutic Lovenox d. IR consult to evaluate if she is a candidate for thrombectomy of the DVT  2. Polysubstance abuse a. Most recently used alcohol, crack cocaine and methamphetamine yesterday b. She is hoping to go into rehab and wishes to get clean c. TOC consult  3. Alcohol abuse and suspected mild alcohol withdrawal a. CIWA protocol  4. Anxiety/depression/bipolar disorder a. Currently not taking her home medications  5. Tobacco use a. Advised cessation     DVT prophylaxis: Lovenox   Code Status: Full Code  Diet: Regular Family Communication: Admission, patients condition and plan of care  including tests being ordered have been discussed with the patient who indicates understanding and agrees with the plan and Code Status.  Disposition Plan: The appropriate patient status for this patient is INPATIENT. Inpatient status is judged to be reasonable and necessary in order to provide the required intensity of service to ensure the patient's safety. The patient's presenting symptoms, physical exam findings, and initial radiographic and laboratory data in the context of their chronic comorbidities is felt to place them at high risk for further clinical deterioration. Furthermore, it is not anticipated that the patient will be medically stable for discharge from the hospital within 2 midnights of admission. The following factors support the patient status of inpatient.   " The patient's presenting symptoms include left lower extremity pain, shortness of breath. " The worrisome physical exam findings include tachycardia and leg swelling. " The initial radiographic and laboratory data are worrisome because of acute saddle PE and DVT. " The chronic co-morbidities include polysubstance abuse.   * I certify that at the point of admission it is my clinical judgment that the patient will require inpatient hospital care spanning beyond 2 midnights from the point of admission due to high intensity of service, high risk for further deterioration and high frequency of surveillance required.*   Status is: Inpatient  Remains inpatient appropriate because:IV treatments appropriate due to intensity of illness or inability to take PO and Inpatient level of care appropriate due to severity of illness   Dispo: The patient is from: Home              Anticipated d/c is to: Home              Patient currently is not medically stable to d/c.   Difficult to place patient No         The medical decision making on this patient was of high complexity and the patient is at high risk for clinical  deterioration, therefore this is a level 3  admission.  Consultants  . None  Procedures  . None  Time Spent on Admission: 70  minutes    Jae Dire, DO [facility]  01/08/2021, 1:10 PM

## 2021-01-08 NOTE — Progress Notes (Signed)
IR was requested for image guided thrombectomy of LLE DVT.   Pt was evaluated at the bedside in ED.  Pt states that her leg pain is better now, 5 out of 10 point pain scale.  She states that the pain was 10/10 when she came in to ED.  Pain comes and goes, is  mostly located aroud the medial malleolus. Putting weight makes pain worse, elevating the leg makes pain better.  Denies any trauma that could cause the pain.   Physical exam revealed no swelling, erythema, warmth, superficial venous dilation noted on the LLE.  Positive to TTP on LLE.  VSS, no tachycardia or thacypenia. O2 97% on room air.  Troponin 1 High sensitivity and BNP normal.   Case was reviewed by Dr. Bryn Gulling, the risk of LLE DVT thrombectomy would outweigh the benefit at this time as patient is hemodynamically stable and serology reveled no sign of heart strain.  Dr. Bryn Gulling discussed with ordering provider.   Will delete IR Rad eval order.   Please call IR for questions and concerns.    Lynann Bologna Keondre Markson PA-C 01/08/2021 3:30 PM

## 2021-01-08 NOTE — ED Provider Notes (Signed)
[facility]-EMERGENCY DEPT Provider Note   CSN: [id number] Arrival date & time: 01/08/21  8295     History Chief Complaint  Patient presents with  . Leg Pain  . detox    Kristina Owens is a 55 y.o. female.  She is here with left leg pain that went on for 2 weeks.  No known trauma.  She was evaluated in the ER and had a positive D-dimer was to get an outpatient ultrasound but this did not happen.  She said for the last 3 days she has had shortness of breath, no chest pain.  Chronic cough.  She is detoxing from alcohol and drugs.  She denies any IV drug use.  Using crack cocaine.she is nauseous no vomiting.  No prior history of DVT or PE.   The history is provided by the patient.  Leg Pain Location:  Leg Leg location:  L leg Pain details:    Quality:  Throbbing   Severity:  Severe   Onset quality:  Gradual   Duration:  2 weeks   Timing:  Constant   Progression:  Worsening Chronicity:  New Relieved by:  Nothing Worsened by:  Bearing weight Ineffective treatments:  None tried Associated symptoms: swelling   Associated symptoms: no back pain, no fever, no numbness and no tingling   Shortness of Breath Severity:  Moderate Onset quality:  Gradual Timing:  Intermittent Progression:  Unchanged Chronicity:  New Relieved by:  None tried Worsened by:  Activity Ineffective treatments:  None tried Associated symptoms: cough   Associated symptoms: no abdominal pain, no chest pain, no fever, no headaches, no hemoptysis, no rash, no sore throat and no vomiting   Risk factors: tobacco use        Past Medical History:  Diagnosis Date  . Anxiety   . Depression   . GERD (gastroesophageal reflux disease)   . Ovarian cyst     Patient Active Problem List   Diagnosis Date Noted  . Bipolar disorder, in full remission, most recent episode depressed (HCC) 01/18/2020  . Generalized anxiety disorder 01/18/2020  . Alcohol use disorder, severe, in early remission,  dependence (HCC) 01/18/2020  . Cocaine use disorder, severe, in early remission (HCC) 01/18/2020  . Suicidal ideation   . 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 Cocaine use disorder, severe, dependence (HCC) 07/24/2019  . History of colon polyps 12/09/2018  . 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 APPENDECTOMY  age 70  . TONSILLECTOMY Bilateral age 71     OB History   No obstetric history on file.     Family History  Problem Relation Age of Onset  . Asthma Mother   . COPD Mother   . Cancer Father        thyroid cancer    Social History   Tobacco Use  . Smoking status: Current Every Day Smoker    Packs/day: 0.50    Years: 35.00    Pack years: 17.50    Types: Cigarettes  . Smokeless tobacco: Never Used  Vaping Use  . Vaping Use: Never used  Substance Use Topics  . Alcohol use: Yes  . Drug use: Yes    Types: "Crack" cocaine, Cocaine, Methamphetamines    Comment: last used 4/31/22    Home Medications Prior to Admission medications   Medication Sig Start Date End Date Taking? Authorizing Provider  ARIPiprazole (ABILIFY) 10 MG tablet TAKE 1 TABLET(10  MG) BY MOUTH DAILY Patient not taking: Reported on 01/01/2021 10/15/20   Zena Amos, MD  benzonatate (TESSALON) 200 MG capsule Take 1 capsule (200 mg total) by mouth every 8 (eight) hours. Patient not taking: Reported on 01/01/2021 11/27/20   Dartha Lodge, PA-C  busPIRone (BUSPAR) 10 MG tablet TAKE 1 TABLET(10 MG) BY MOUTH THREE TIMES DAILY Patient not taking: Reported on 01/01/2021 10/29/20   Zena Amos, MD  cephALEXin (KEFLEX) 500 MG capsule Take 1 capsule (500 mg total) by mouth 4 (four) times daily. 01/01/21   Geoffery Lyons, MD  HYDROcodone-acetaminophen (NORCO) 5-325 MG tablet Take 1-2 tablets by mouth every 6 (six) hours as needed. 01/01/21   Geoffery Lyons, MD  ibuprofen (ADVIL) 200 MG tablet Take 200 mg by mouth every 6 (six) hours as  needed for headache or moderate pain.    [provider]  lamoTRIgine (LAMICTAL) 100 MG tablet Take 1 tablet (100 mg total) by mouth 2 (two) times daily. Patient not taking: Reported on 01/01/2021 10/01/20   Zena Amos, MD  sertraline (ZOLOFT) 50 MG tablet Take 1 tablet (50 mg total) by mouth daily. Patient not taking: Reported on 01/01/2021 10/01/20   Zena Amos, MD    Allergies    Patient has no known allergies.  Review of Systems   Review of Systems  Constitutional: Negative for fever.  HENT: Negative for sore throat.   Eyes: Negative for visual disturbance.  Respiratory: Positive for cough and shortness of breath. Negative for hemoptysis.   Cardiovascular: Negative for chest pain.  Gastrointestinal: Negative for abdominal pain and vomiting.  Genitourinary: Negative for dysuria.  Musculoskeletal: Negative for back pain.  Skin: Negative for rash.  Neurological: Negative for headaches.    Physical Exam Updated Vital Signs BP 130/90 (BP Location: Right Arm)   Pulse (!) 130   Temp 98.4 F (36.9 C) (Oral)   Resp 16   SpO2 100%   Physical Exam Vitals and nursing note reviewed.  Constitutional:      General: She is not in acute distress.    Appearance: Normal appearance. She is well-developed.  HENT:     Head: Normocephalic and atraumatic.  Eyes:     Conjunctiva/sclera: Conjunctivae normal.  Cardiovascular:     Rate and Rhythm: Regular rhythm. Tachycardia present.     Heart sounds: No murmur heard.   Pulmonary:     Effort: Pulmonary effort is normal. No respiratory distress.     Breath sounds: Normal breath sounds.  Abdominal:     Palpations: Abdomen is soft.     Tenderness: There is no abdominal tenderness. There is no guarding or rebound.  Musculoskeletal:        General: Tenderness present. No deformity.     Cervical back: Neck supple.     Right lower leg: No edema.     Comments: LLE diffuse pain  Skin:    General: Skin is warm and dry.   Neurological:     General: No focal deficit present.     Mental Status: She is alert.     ED Results / Procedures / Treatments   Labs (all labs ordered are listed, but only abnormal results are displayed) Labs Reviewed  BASIC METABOLIC PANEL - Abnormal; Notable for the following components:      Result Value   Glucose, Bld 112 (*)    BUN <5 (*)    Calcium 8.8 (*)    All other components within normal limits  RESP PANEL BY RT-PCR (FLU  A&B, COVID) ARPGX2  PROTIME-INR  BRAIN NATRIURETIC PEPTIDE  MAGNESIUM  CBC WITH DIFFERENTIAL/PLATELET  HIV ANTIBODY (ROUTINE TESTING W REFLEX)  BASIC METABOLIC PANEL  CBC  TROPONIN I (HIGH SENSITIVITY)    EKG EKG Interpretation  Date/Time:  Tuesday January 08 2021 09:05:25 EDT Ventricular Rate:  119 PR Interval:  108 QRS Duration: 103 QT Interval:  338 QTC Calculation: 476 R Axis:   79 Text Interpretation: Sinus tachycardia 12 Lead; Mason-Likar No old tracing to compare Confirmed by Meridee Score [phone]) on 01/08/2021 9:11:16 AM   Radiology CT Angio Chest PE W/Cm &/Or Wo Cm  Result Date: 01/08/2021 CLINICAL DATA:  Positive for DVT today, leg pain, clinically suspected pulmonary embolism EXAM: CT ANGIOGRAPHY CHEST WITH CONTRAST TECHNIQUE: Multidetector CT imaging of the chest was performed using the standard protocol during bolus administration of intravenous contrast. Multiplanar CT image reconstructions and MIPs were obtained to evaluate the vascular anatomy. CONTRAST:  33mL OMNIPAQUE IOHEXOL 350 MG/ML SOLN IV COMPARISON:  None FINDINGS: Cardiovascular: Aorta normal caliber without aneurysm or dissection. Heart unremarkable. No pericardial effusion. Pulmonary arteries adequately opacified. Multiple filling defects are seen within the pulmonary arteries bilaterally consistent with pulmonary emboli. These include saddle embolus at the bifurcation of the RIGHT pulmonary artery, saddle embolus at bifurcation of RIGHT middle and RIGHT lower lobe  pulmonary arteries, and within BILATERAL lower lobes and RIGHT middle lobe. RV/LV ratio = 1.01, elevated, consistent with RIGHT heart strain. Mediastinum/Nodes: Esophagus unremarkable. Base of cervical region normal appearance. No thoracic adenopathy. Lungs/Pleura: Scattered atelectasis. Small foci of infiltrate RIGHT upper lobe laterally and in lingula. No additional consolidation, pleural effusion, or pneumothorax. Upper Abdomen: Visualized upper abdomen unremarkable Musculoskeletal: No acute osseous findings. Review of the MIP images confirms the above findings. IMPRESSION: Multiple BILATERAL pulmonary emboli including saddle embolus at the bifurcation of the RIGHT pulmonary artery, saddle embolus at bifurcation of RIGHT middle and RIGHT lower lobe pulmonary arteries, and within BILATERAL lower lobes and RIGHT middle lobe. Positive for acute PE with CT evidence of right heart strain (RV/LV Ratio = 1.01) consistent with at least submassive (intermediate risk) PE. The presence of right heart strain has been associated with an increased risk of morbidity and mortality. Please refer to the "PE Focused" order set in EPIC. Scattered atelectasis with small foci of infiltrate in RIGHT upper lobe laterally and in lingula. Critical Value/emergent results were called by telephone at the time of interpretation on 01/08/2021 at 10:52 am to provider Meridee Score MD, who verbally acknowledged these results. Electronically Signed   By: Ulyses Southward M.D.   On: 01/08/2021 10:53   VAS [place] LOWER EXTREMITY VENOUS (DVT) ([facility] and [facility] 7a-7p)  Result Date: 01/08/2021  Lower Venous DVT Study Patient Name:  Kristina Owens  Date of Exam:   01/08/2021 Medical Rec #: [id number]          Accession #:    [id number] Date of Birth: Dec 16, 1965         Patient Gender: F Patient Age:   54Y Exam Location:  [facility] Procedure:      VAS [place] LOWER EXTREMITY VENOUS (DVT) Referring Phys: [id number] Delray Reza C Jabes Primo  --------------------------------------------------------------------------------  Indications: Swelling.  Risk Factors: None identified. Comparison Study: No prior studies. Performing Technologist: Chanda Busing RVT  Examination Guidelines: A complete evaluation includes B-mode imaging, spectral Doppler, color Doppler, and power Doppler as needed of all accessible portions of each vessel. Bilateral testing is considered an integral part of a complete examination. Limited examinations for reoccurring  indications may be performed as noted. The reflux portion of the exam is performed with the patient in reverse Trendelenburg.  +-----+---------------+---------+-----------+----------+--------------+ RIGHTCompressibilityPhasicitySpontaneityPropertiesThrombus Aging +-----+---------------+---------+-----------+----------+--------------+ CFV  Full           Yes      Yes                                 +-----+---------------+---------+-----------+----------+--------------+   +---------+---------------+---------+-----------+----------+--------------+ LEFT     CompressibilityPhasicitySpontaneityPropertiesThrombus Aging +---------+---------------+---------+-----------+----------+--------------+ CFV      Partial        No       No                   Acute          +---------+---------------+---------+-----------+----------+--------------+ SFJ      Full                                                        +---------+---------------+---------+-----------+----------+--------------+ FV Prox  None                                         Acute          +---------+---------------+---------+-----------+----------+--------------+ FV Mid   None           No       No                   Acute          +---------+---------------+---------+-----------+----------+--------------+ FV DistalNone           No       No                   Acute           +---------+---------------+---------+-----------+----------+--------------+ PFV      Full                                                        +---------+---------------+---------+-----------+----------+--------------+ POP      None           No       No                   Acute          +---------+---------------+---------+-----------+----------+--------------+ PTV      None                                         Acute          +---------+---------------+---------+-----------+----------+--------------+ PERO     None                                         Acute          +---------+---------------+---------+-----------+----------+--------------+ Soleal   None  Acute          +---------+---------------+---------+-----------+----------+--------------+ Gastroc  Partial                                      Acute          +---------+---------------+---------+-----------+----------+--------------+ EIV      Full           No       No                                  +---------+---------------+---------+-----------+----------+--------------+    Summary: RIGHT: - There is no evidence of deep vein thrombosis in the lower extremity.  LEFT: - Findings consistent with acute deep vein thrombosis involving the left common femoral vein, left femoral vein, left popliteal vein, left posterior tibial veins, left peroneal veins, left soleal veins, and left gastrocnemius veins. - No cystic structure found in the popliteal fossa.  *See table(s) above for measurements and observations.    Preliminary     Procedures .Critical Care Performed by: Terrilee Files, MD Authorized by: Terrilee Files, MD   Critical care provider statement:    Critical care time (minutes):  45   Critical care time was exclusive of:  Separately billable procedures and treating other patients   Critical care was necessary to treat or prevent imminent or  life-threatening deterioration of the following conditions:  Circulatory failure   Critical care was time spent personally by me on the following activities:  Discussions with consultants, evaluation of patient's response to treatment, examination of patient, ordering and performing treatments and interventions, ordering and review of laboratory studies, ordering and review of radiographic studies, pulse oximetry, re-evaluation of patient's condition, obtaining history from patient or surrogate, review of old charts and development of treatment plan with patient or surrogate     Medications Ordered in ED Medications  sodium chloride 0.9 % bolus 1,000 mL (has no administration in time range)  HYDROmorphone (DILAUDID) injection 1 mg (has no administration in time range)  sodium chloride (PF) 0.9 % injection (has no administration in time range)  HYDROcodone-acetaminophen (NORCO/VICODIN) 5-325 MG per tablet 1 tablet (1 tablet Oral Given 01/08/21 0904)  iohexol (OMNIPAQUE) 350 MG/ML injection 80 mL (75 mLs Intravenous Contrast Given 01/08/21 1035)    ED Course  I have reviewed the triage vital signs and the nursing notes.  Pertinent labs & imaging results that were available during my care of the patient were reviewed by me and considered in my medical decision making (see chart for details).  Clinical Course as of 01/08/21 1835  Tue Jan 08, 2021  1048 I was informed by the CT tech that she thinks the patient has a saddle PE.  I have asked charge nurse to try and get the patient into her regular room so we can get her monitored. [MB]  [id number] Radiologist called me with results of her CT chest.  She has a submassive PE and evidence of some right heart strain.  I have updated the patient and ordered heparin per pharmacy protocol.  I have paged intensive care and hospitalist [MB]  1123 Discussed with Dr. Dairl Ponder [facility] hospitalist will evaluate patient for admission.  Discussed with Dr. Everardo All critical care.   She felt that the patient was appropriate for hospitalist admission and recommends routine anticoagulation. [MB]  Clinical Course User Index [MB] Terrilee FilesButler, Sayre Witherington C, MD   MDM Rules/Calculators/A&P                         This patient complains of left leg pain shortness of breath; this involves an extensive number of treatment Options and is a complaint that carries with it a high risk of complications and Morbidity. The differential includes DVT, PE, musculoskeletal, vascular, cellulitis  I ordered, reviewed and interpreted labs, which included chemistries with mildly elevated glucose normal renal function, INR normal I ordered medication IV fluids and pain medication, IV heparin I ordered imaging studies which included duplex left lower extremity and CT angio chest and I independently    visualized and interpreted imaging which showed extensive DVT and saddle PE  Previous records obtained and reviewed including recent ED visit last week when she had a positive D-dimer and unfortunately ultrasound was not available, patient was instructed to return in the morning for duplex I consulted critical care Dr. Everardo AllEllison and [facility] hospitalist Dr. Dairl PonderSegal and discussed lab and imaging findings  Critical Interventions: Identification and treatment of patient's acute DVT PE with anticoagulation  After the interventions stated above, I reevaluated the patient and found patient will need to be admitted to the hospital for submassive PE.  Final Clinical Impression(s) / ED Diagnoses Final diagnoses:  Acute saddle pulmonary embolism without acute cor pulmonale (HCC)  Acute deep vein thrombosis (DVT) of left lower extremity, unspecified vein (HCC)    Rx / DC Orders ED Discharge Orders    None       Terrilee FilesButler, Dayanna Pryce C, MD 01/08/21 [id number]

## 2021-01-08 NOTE — ED Notes (Signed)
Verified with provider pt not being heparinized at this time. CIWA protocol started.

## 2021-01-08 NOTE — ED Notes (Signed)
Report sent to floor nurse.  

## 2021-01-09 LAB — CBC
HCT: 45.7 % (ref 36.0–46.0)
Hemoglobin: 14.4 g/dL (ref 12.0–15.0)
MCH: 30.3 pg (ref 26.0–34.0)
MCHC: 31.5 g/dL (ref 30.0–36.0)
MCV: 96.2 fL (ref 80.0–100.0)
Platelets: 141 10*3/uL — ABNORMAL LOW (ref 150–400)
RBC: 4.75 MIL/uL (ref 3.87–5.11)
RDW: 16.9 % — ABNORMAL HIGH (ref 11.5–15.5)
WBC: 7.6 10*3/uL (ref 4.0–10.5)
nRBC: 0 % (ref 0.0–0.2)

## 2021-01-09 LAB — BASIC METABOLIC PANEL
Anion gap: 6 (ref 5–15)
BUN: 8 mg/dL (ref 6–20)
CO2: 31 mmol/L (ref 22–32)
Calcium: 8.2 mg/dL — ABNORMAL LOW (ref 8.9–10.3)
Chloride: 102 mmol/L (ref 98–111)
Creatinine, Ser: 0.76 mg/dL (ref 0.44–1.00)
GFR, Estimated: >60 mL/min (ref >60)
Glucose, Bld: 107 mg/dL — ABNORMAL HIGH (ref 70–99)
Potassium: 3.1 mmol/L — ABNORMAL LOW (ref 3.5–5.1)
Sodium: 139 mmol/L (ref 135–145)

## 2021-01-09 MED ORDER — POTASSIUM CHLORIDE 20 MEQ PO PACK
40.0000 meq | PACK | Freq: Once | ORAL | Status: AC
  Administered 2021-01-09: 40 meq via ORAL
  Filled 2021-01-09: qty 2

## 2021-01-09 MED ORDER — ENOXAPARIN SODIUM 80 MG/0.8ML IJ SOSY
70.0000 mg | PREFILLED_SYRINGE | Freq: Two times a day (BID) | INTRAMUSCULAR | Status: DC
  Administered 2021-01-09 – 2021-01-10 (×2): 70 mg via SUBCUTANEOUS
  Filled 2021-01-09 (×2): qty 0.8

## 2021-01-09 MED ORDER — NICOTINE 21 MG/24HR TD PT24
21.0000 mg | MEDICATED_PATCH | Freq: Every day | TRANSDERMAL | Status: DC
  Administered 2021-01-09 – 2021-01-17 (×9): 21 mg via TRANSDERMAL
  Filled 2021-01-09 (×11): qty 1

## 2021-01-09 NOTE — TOC Initial Note (Addendum)
Transition of Care [facility]) - Initial/Assessment Note    Patient Details  Name: Kristina Owens MRN: [id number] Date of Birth: 08-12-1965  [facility] CM/SW Contact:    Lanier Clam, RN Phone Number: 01/09/2021, 12:47 PM  Clinical Narrative: Spoke to patient about d/c plans-d/c to stay w/friends or to her car-she declines shelter, or substance abuse counseling. Has no health insurance,or pcp-states she lost her job 4 weeks [employer] but no more. Informed of [employer] for health insurance-provided resources-insurance,health dept Social Services,med asst resources.Will use MATCH program for eliquis after pharmacy provides 30day free discount coupon. [facility] pharmacy, & pcp /[facility] otpt pharmacy for meds to bed-patient has $3 co pay cash.Has own transport home or to her car.Financial counselor outsourced to [employer] for medical application screening.   1:36p-[place] for pcp appt set.            Expected Discharge Plan: Home/Self Care Barriers to Discharge: Continued Medical Work up   Patient Goals and CMS Choice Patient states their goals for this hospitalization and ongoing recovery are:: may go to her car or friends home CMS Medicare.gov Compare Post Acute Care list provided to:: Patient    Expected Discharge Plan and Services Expected Discharge Plan: Home/Self Care   Discharge Planning Services: CM Consult   Living arrangements for the past 2 months: Apartment                                      Prior Living Arrangements/Services Living arrangements for the past 2 months: Apartment Lives with:: Friends Patient language and need for interpreter reviewed:: Yes Do you feel safe going back to the place where you live?: Yes      Need for Family Participation in Patient Care: No (Comment) Care giver support system in place?: Yes (comment)   Criminal Activity/Legal Involvement Pertinent to Current  Situation/Hospitalization: No - Comment as needed  Activities of Daily Living Home Assistive Devices/Equipment: Eyeglasses ADL Screening (condition at time of admission) Patient's cognitive ability adequate to safely complete daily activities?: Yes Is the patient deaf or have difficulty hearing?: No Does the patient have difficulty seeing, even when wearing glasses/contacts?: No Does the patient have difficulty concentrating, remembering, or making decisions?: No Patient able to express need for assistance with ADLs?: Yes Does the patient have difficulty dressing or bathing?: No Independently performs ADLs?: Yes (appropriate for developmental age) Does the patient have difficulty walking or climbing stairs?: Yes (secondary to left ankle pain) Weakness of Legs: Left Weakness of Arms/Hands: None  Permission Sought/Granted Permission sought to share information with : Case Manager Permission granted to share information with : Yes, Verbal Permission Granted  Share Information with NAME: Case manager           Emotional Assessment Appearance:: Appears stated age Attitude/Demeanor/Rapport: Gracious Affect (typically observed): Accepting Orientation: : Oriented to Self,Oriented to Place,Oriented to  Time,Oriented to Situation Alcohol / Substance Use: Alcohol Use,Illicit Drugs Psych Involvement: No (comment)  Admission diagnosis:  Pulmonary embolism (HCC) [I26.99] Patient Active Problem List   Diagnosis Date Noted  . Pulmonary embolism (HCC) 01/08/2021  . DVT (deep venous thrombosis) (HCC) 01/08/2021  . Polysubstance (excluding opioids) dependence (HCC) 01/08/2021  . Alcohol abuse with withdrawal (HCC) 01/08/2021  . Bipolar disorder, in full remission, most recent episode depressed (HCC) 01/18/2020  . Generalized anxiety disorder 01/18/2020  . Alcohol use disorder, severe, in  early remission, dependence (HCC) 01/18/2020  . Cocaine use disorder, severe, in early remission (HCC)  01/18/2020  . Suicidal ideation   . 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 Cocaine use disorder, severe, dependence (HCC) 07/24/2019  . History of colon polyps 12/09/2018  . Substance abuse in remission (HCC) 12/09/2018   PCP:  Greer Ee., FNP Pharmacy:   [facility] [phone] - [place], [place] - [address] AT [facility] [address] [place] [address] Phone: [phone] Fax: [phone]     Social Determinants of Health (SDOH) Interventions    Readmission Risk Interventions No flowsheet data found.

## 2021-01-09 NOTE — Progress Notes (Signed)
PROGRESS NOTE    Kristina Owens  JOI:[id number] DOB: Mar 28, 1966 DOA: 01/08/2021 PCP: Greer Ee., FNP   Brief Narrative: This 55 years old female with PMH significant for anxiety, depression, GERD, bipolar, polysubstance abuse, alcohol abuse presented in the ED with 2 weeks history of left leg pain.  Patient was seen in the ED on 5/31 for alcohol withdrawal and at the time she was noted to have left ankle pain.  she had a D-dimer 1.3 and was advised to get an outpatient ultrasound but this did not happen.  Patient most recently used crack cocaine and methamphetamine and alcohol one day prior to admission.  She denies any IV drug use.  She does report history of tremors and night sweats  from withdrawal.  She denies any history of cancer, VTE, OCP use, recent surgery, recent travel but does smoke cigarettes regularly.  Bilateral lower extremity Duplex: positive for left lower extremity acute DVT involving left common femoral, left femoral, popliteal, posterior tibial, peroneal, soleal and gastrocnemius veins.  CTA chest shows multiple bilateral PE including saddle embolus with CT evidence of right heart strain. Patient was admitted for DVT and acute PE and started on Lovenox.  Assessment & Plan:   Active Problems:   Pulmonary embolism (HCC)   DVT (deep venous thrombosis) (HCC)   Polysubstance (excluding opioids) dependence (HCC)   Alcohol abuse with withdrawal (HCC)   Acute unprovoked left LE DVT /Acute saddle PE with right heart strain. Patient is hemodynamically stable. Bilateral LE Duplex : Positive L LE acute DVT involving left common femoral, left femoral, popliteal, posterior tibial, peroneal, soleal and gastrocnemius veins. CTA chest shows multiple bilateral PE including saddle embolus with right heart strain. Continue therapeutic Lovenox  IR consulted: Patient is not a candidate for thrombectomy,  reconsult IR if patient is unstable. Pharmacy consulted to transition to  Eliquis.   Polysubstance abuse: Patient reported recent use of alcohol, crack cocaine and methamphetamine 1 day prior to hospitalization Patient is interested to go into rehab and wishes to detox Transition of care consulted.  Alcohol abuse and suspected alcohol withdrawal.: Continue CIWA protocol  Anxiety disorder, bipolar, depression Continue home medications.  Tobacco use: Continue nicotine patch, advised smoking cessation  DVT prophylaxis: Lovenox Code Status: Full code. Family Communication:No family at bedside. Disposition Plan:  Status is: Inpatient  Remains inpatient appropriate because:Inpatient level of care appropriate due to severity of illness   Dispo: The patient is from: Home              Anticipated d/c is to: Home              Patient currently is not medically stable to d/c.   Difficult to place patient No   Consultants:    None  Procedures: Venous duplex, CTA chest. Antimicrobials:  Anti-infectives (From admission, onward)   None      Subjective: Patient was seen and examined at bedside.  Overnight events noted.  She reports left leg pain,  reports not able to walk.   Patient also reports chest soreness but denies any difficulty breathing.  Objective: Vitals:   01/08/21 2107 01/09/21 0046 01/09/21 0448 01/09/21 1135  BP: 103/71 113/68 106/71 107/86  Pulse: 95 96 90 92  Resp: 14 18 18    Temp: 98.3 F (36.8 C) 97.9 F (36.6 C) 98.5 F (36.9 C)   TempSrc: Oral Oral Oral   SpO2: 96% 100% 96%   Weight:        Intake/Output Summary (  Last 24 hours) at 01/09/2021 1258 Last data filed at 01/08/2021 2315 Gross per 24 hour  Intake 1363 ml  Output --  Net 1363 ml   Filed Weights   01/08/21 1207  Weight: 68 kg    Examination:  General exam: Appears calm and comfortable , not in any acute distress. Respiratory system: Clear to auscultation. Respiratory effort normal. Cardiovascular system: S1 & S2 heard, RRR. No JVD, murmurs, rubs,  gallops or clicks. No pedal edema. Gastrointestinal system: Abdomen is nondistended, soft and nontender. No organomegaly or masses felt. Normal bowel sounds heard. Central nervous system: Alert and oriented. No focal neurological deficits. Extremities: Symmetric 5 x 5 power.  Left leg calf tenderness noted. Skin: No rashes, lesions or ulcers Psychiatry: Judgement and insight appear normal. Mood & affect appropriate.     Data Reviewed: I have personally reviewed following labs and imaging studies  CBC: Recent Labs  Lab 01/08/21 1220 01/09/21 0518  WBC 7.9 7.6  NEUTROABS 4.6  --   HGB 15.3* 14.4  HCT 48.1* 45.7  MCV 94.3 96.2  PLT 170 141*   Basic Metabolic Panel: Recent Labs  Lab 01/08/21 0915 01/08/21 1740 01/09/21 0518  NA 139  --  139  K 3.8  --  3.1*  CL 100  --  102  CO2 28  --  31  GLUCOSE 112*  --  107*  BUN <5*  --  8  CREATININE 0.75  --  0.76  CALCIUM 8.8*  --  8.2*  MG  --  2.0  --    GFR: Estimated Creatinine Clearance: 75.2 mL/min (by C-G formula based on SCr of 0.76 mg/dL). Liver Function Tests: No results for input(s): AST, ALT, ALKPHOS, BILITOT, PROT, ALBUMIN in the last 168 hours. No results for input(s): LIPASE, AMYLASE in the last 168 hours. No results for input(s): AMMONIA in the last 168 hours. Coagulation Profile: Recent Labs  Lab 01/08/21 0915  INR 1.1   Cardiac Enzymes: No results for input(s): CKTOTAL, CKMB, CKMBINDEX, TROPONINI in the last 168 hours. BNP (last 3 results) No results for input(s): PROBNP in the last 8760 hours. HbA1C: No results for input(s): HGBA1C in the last 72 hours. CBG: No results for input(s): GLUCAP in the last 168 hours. Lipid Profile: No results for input(s): CHOL, HDL, LDLCALC, TRIG, CHOLHDL, LDLDIRECT in the last 72 hours. Thyroid Function Tests: No results for input(s): TSH, T4TOTAL, FREET4, T3FREE, THYROIDAB in the last 72 hours. Anemia Panel: No results for input(s): VITAMINB12, FOLATE, FERRITIN,  TIBC, IRON, RETICCTPCT in the last 72 hours. Sepsis Labs: No results for input(s): PROCALCITON, LATICACIDVEN in the last 168 hours.  Recent Results (from the past 240 hour(s))  Resp Panel by RT-PCR (Flu A&B, Covid) Nasopharyngeal Swab     Status: None   Collection Time: 01/08/21 12:20 PM   Specimen: Nasopharyngeal Swab; Nasopharyngeal(NP) swabs in vial transport medium  Result Value Ref Range Status   SARS Coronavirus 2 by RT PCR NEGATIVE NEGATIVE Final    Comment: (NOTE) SARS-CoV-2 target nucleic acids are NOT DETECTED.  The SARS-CoV-2 RNA is generally detectable in upper respiratory specimens during the acute phase of infection. The lowest concentration of SARS-CoV-2 viral copies this assay can detect is 138 copies/mL. A negative result does not preclude SARS-Cov-2 infection and should not be used as the sole basis for treatment or other patient management decisions. A negative result may occur with  improper specimen collection/handling, submission of specimen other than nasopharyngeal swab, presence of viral mutation(s)  within the areas targeted by this assay, and inadequate number of viral copies(<138 copies/mL). A negative result must be combined with clinical observations, patient history, and epidemiological information. The expected result is Negative.  Fact Sheet for Patients:  BloggerCourse.com  Fact Sheet for Healthcare Providers:  SeriousBroker.it  This test is no t yet approved or cleared by the [place] FDA and  has been authorized for detection and/or diagnosis of SARS-CoV-2 by FDA under an Emergency Use Authorization (EUA). This EUA will remain  in effect (meaning this test can be used) for the duration of the COVID-19 declaration under Section 564(b)(1) of the Act, 21 U.S.C.section 360bbb-3(b)(1), unless the authorization is terminated  or revoked sooner.       Influenza A by PCR NEGATIVE NEGATIVE Final    Influenza B by PCR NEGATIVE NEGATIVE Final    Comment: (NOTE) The Xpert Xpress SARS-CoV-2/FLU/RSV plus assay is intended as an aid in the diagnosis of influenza from Nasopharyngeal swab specimens and should not be used as a sole basis for treatment. Nasal washings and aspirates are unacceptable for Xpert Xpress SARS-CoV-2/FLU/RSV testing.  Fact Sheet for Patients: BloggerCourse.com  Fact Sheet for Healthcare Providers: SeriousBroker.it  This test is not yet approved or cleared by the [place] FDA and has been authorized for detection and/or diagnosis of SARS-CoV-2 by FDA under an Emergency Use Authorization (EUA). This EUA will remain in effect (meaning this test can be used) for the duration of the COVID-19 declaration under Section 564(b)(1) of the Act, 21 U.S.C. section 360bbb-3(b)(1), unless the authorization is terminated or revoked.  Performed at [facility], [address], [place], [place] [address]     Radiology Studies: CT Angio Chest PE W/Cm &/Or Wo Cm  Result Date: 01/08/2021 CLINICAL DATA:  Positive for DVT today, leg pain, clinically suspected pulmonary embolism EXAM: CT ANGIOGRAPHY CHEST WITH CONTRAST TECHNIQUE: Multidetector CT imaging of the chest was performed using the standard protocol during bolus administration of intravenous contrast. Multiplanar CT image reconstructions and MIPs were obtained to evaluate the vascular anatomy. CONTRAST:  75mL OMNIPAQUE IOHEXOL 350 MG/ML SOLN IV COMPARISON:  None FINDINGS: Cardiovascular: Aorta normal caliber without aneurysm or dissection. Heart unremarkable. No pericardial effusion. Pulmonary arteries adequately opacified. Multiple filling defects are seen within the pulmonary arteries bilaterally consistent with pulmonary emboli. These include saddle embolus at the bifurcation of the RIGHT pulmonary artery, saddle embolus at bifurcation of RIGHT middle  and RIGHT lower lobe pulmonary arteries, and within BILATERAL lower lobes and RIGHT middle lobe. RV/LV ratio = 1.01, elevated, consistent with RIGHT heart strain. Mediastinum/Nodes: Esophagus unremarkable. Base of cervical region normal appearance. No thoracic adenopathy. Lungs/Pleura: Scattered atelectasis. Small foci of infiltrate RIGHT upper lobe laterally and in lingula. No additional consolidation, pleural effusion, or pneumothorax. Upper Abdomen: Visualized upper abdomen unremarkable Musculoskeletal: No acute osseous findings. Review of the MIP images confirms the above findings. IMPRESSION: Multiple BILATERAL pulmonary emboli including saddle embolus at the bifurcation of the RIGHT pulmonary artery, saddle embolus at bifurcation of RIGHT middle and RIGHT lower lobe pulmonary arteries, and within BILATERAL lower lobes and RIGHT middle lobe. Positive for acute PE with CT evidence of right heart strain (RV/LV Ratio = 1.01) consistent with at least submassive (intermediate risk) PE. The presence of right heart strain has been associated with an increased risk of morbidity and mortality. Please refer to the "PE Focused" order set in EPIC. Scattered atelectasis with small foci of infiltrate in RIGHT upper lobe laterally and in lingula. Critical  Value/emergent results were called by telephone at the time of interpretation on 01/08/2021 at 10:52 am to provider Meridee ScoreMICHAEL BUTLER MD, who verbally acknowledged these results. Electronically Signed   By: Ulyses SouthwardMark  Boles M.D.   On: 01/08/2021 10:53   VAS US LOWER EXTREMITY VENOUS (DVT) (MC and WL 7a-7p)  Result Date: 01/08/2021  Lower Venous DVT Study Patient Name:  Kristina LanJENNIFER Southall  Date of Exam:   01/08/2021 Medical Rec #: [id number]          Accession #:    [id number] Date of Birth: July 20, 1966         Patient Gender: F Patient Age:   54Y [facility] Procedure:      VAS US LOWER EXTREMITY VENOUS (DVT) Referring Phys: [id number] MICHAEL C BUTLER  --------------------------------------------------------------------------------  Indications: Swelling.  Risk Factors: None identified. Comparison Study: No prior studies. Performing Technologist: Chanda BusingGregory Collins RVT  Examination Guidelines: A complete evaluation includes B-mode imaging, spectral Doppler, color Doppler, and power Doppler as needed of all accessible portions of each vessel. Bilateral testing is considered an integral part of a complete examination. Limited examinations for reoccurring indications may be performed as noted. The reflux portion of the exam is performed with the patient in reverse Trendelenburg.  +-----+---------------+---------+-----------+----------+--------------+ RIGHTCompressibilityPhasicitySpontaneityPropertiesThrombus Aging +-----+---------------+---------+-----------+----------+--------------+ CFV  Full           Yes      Yes                                 +-----+---------------+---------+-----------+----------+--------------+   +---------+---------------+---------+-----------+----------+--------------+ LEFT     CompressibilityPhasicitySpontaneityPropertiesThrombus Aging +---------+---------------+---------+-----------+----------+--------------+ CFV      Partial        No       No                   Acute          +---------+---------------+---------+-----------+----------+--------------+ SFJ      Full                                                        +---------+---------------+---------+-----------+----------+--------------+ FV Prox  None                                         Acute          +---------+---------------+---------+-----------+----------+--------------+ FV Mid   None           No       No                   Acute          +---------+---------------+---------+-----------+----------+--------------+ FV DistalNone           No       No                   Acute           +---------+---------------+---------+-----------+----------+--------------+ PFV      Full                                                        +---------+---------------+---------+-----------+----------+--------------+  POP      None           No       No                   Acute          +---------+---------------+---------+-----------+----------+--------------+ PTV      None                                         Acute          +---------+---------------+---------+-----------+----------+--------------+ PERO     None                                         Acute          +---------+---------------+---------+-----------+----------+--------------+ Soleal   None                                         Acute          +---------+---------------+---------+-----------+----------+--------------+ Gastroc  Partial                                      Acute          +---------+---------------+---------+-----------+----------+--------------+ EIV      Full           No       No                                  +---------+---------------+---------+-----------+----------+--------------+     Summary: RIGHT: - There is no evidence of deep vein thrombosis in the lower extremity.  LEFT: - Findings consistent with acute deep vein thrombosis involving the left common femoral vein, left femoral vein, left popliteal vein, left posterior tibial veins, left peroneal veins, left soleal veins, and left gastrocnemius veins. - No cystic structure found in the popliteal fossa.  *See table(s) above for measurements and observations. Electronically signed by Fabienne Bruns MD on 01/08/2021 at 4:28:42 PM.    Final    Scheduled Meds: . enoxaparin (LOVENOX) injection  70 mg Subcutaneous BID  . folic acid  1 mg Oral Daily  . multivitamin with minerals  1 tablet Oral Daily  . nicotine  21 mg Transdermal Daily  . potassium chloride  40 mEq Oral Once  . sodium chloride flush  3 mL Intravenous Q12H   . thiamine  100 mg Oral Daily   Or  . thiamine  100 mg Intravenous Daily   Continuous Infusions:   LOS: 1 day    Time spent: 35 mins    Caralynn Gelber, MD [facility]   If 7PM-7AM, please contact night-coverage

## 2021-01-10 LAB — BASIC METABOLIC PANEL
Anion gap: 5 (ref 5–15)
BUN: 8 mg/dL (ref 6–20)
CO2: 26 mmol/L (ref 22–32)
Calcium: 8.2 mg/dL — ABNORMAL LOW (ref 8.9–10.3)
Chloride: 103 mmol/L (ref 98–111)
Creatinine, Ser: 0.72 mg/dL (ref 0.44–1.00)
GFR, Estimated: >60 mL/min (ref >60)
Glucose, Bld: 103 mg/dL — ABNORMAL HIGH (ref 70–99)
Potassium: 4.4 mmol/L (ref 3.5–5.1)
Sodium: 134 mmol/L — ABNORMAL LOW (ref 135–145)

## 2021-01-10 LAB — CBC
HCT: 47.6 % — ABNORMAL HIGH (ref 36.0–46.0)
Hemoglobin: 15 g/dL (ref 12.0–15.0)
MCH: 30.1 pg (ref 26.0–34.0)
MCHC: 31.5 g/dL (ref 30.0–36.0)
MCV: 95.6 fL (ref 80.0–100.0)
Platelets: 162 10*3/uL (ref 150–400)
RBC: 4.98 MIL/uL (ref 3.87–5.11)
RDW: 16.5 % — ABNORMAL HIGH (ref 11.5–15.5)
WBC: 8.6 10*3/uL (ref 4.0–10.5)
nRBC: 0 % (ref 0.0–0.2)

## 2021-01-10 MED ORDER — CARVEDILOL 3.125 MG PO TABS
3.1250 mg | ORAL_TABLET | Freq: Two times a day (BID) | ORAL | Status: DC
  Administered 2021-01-11 – 2021-01-12 (×3): 3.125 mg via ORAL
  Filled 2021-01-10 (×5): qty 1

## 2021-01-10 MED ORDER — ARIPIPRAZOLE 10 MG PO TABS
10.0000 mg | ORAL_TABLET | Freq: Every day | ORAL | Status: DC
  Administered 2021-01-10 – 2021-01-18 (×9): 10 mg via ORAL
  Filled 2021-01-10 (×9): qty 1

## 2021-01-10 MED ORDER — LAMOTRIGINE 25 MG PO TABS
25.0000 mg | ORAL_TABLET | Freq: Every day | ORAL | Status: DC
  Administered 2021-01-10 – 2021-01-18 (×9): 25 mg via ORAL
  Filled 2021-01-10 (×9): qty 1

## 2021-01-10 MED ORDER — APIXABAN 5 MG PO TABS
5.0000 mg | ORAL_TABLET | Freq: Two times a day (BID) | ORAL | Status: DC
  Administered 2021-01-18: 5 mg via ORAL
  Filled 2021-01-10: qty 1

## 2021-01-10 MED ORDER — SERTRALINE HCL 50 MG PO TABS
50.0000 mg | ORAL_TABLET | Freq: Every day | ORAL | Status: DC
  Administered 2021-01-10 – 2021-01-18 (×9): 50 mg via ORAL
  Filled 2021-01-10 (×9): qty 1

## 2021-01-10 MED ORDER — BUSPIRONE HCL 5 MG PO TABS
10.0000 mg | ORAL_TABLET | Freq: Three times a day (TID) | ORAL | Status: DC
  Administered 2021-01-10 – 2021-01-18 (×24): 10 mg via ORAL
  Filled 2021-01-10 (×23): qty 2

## 2021-01-10 MED ORDER — APIXABAN 5 MG PO TABS
10.0000 mg | ORAL_TABLET | Freq: Two times a day (BID) | ORAL | Status: AC
  Administered 2021-01-10 – 2021-01-17 (×15): 10 mg via ORAL
  Filled 2021-01-10 (×15): qty 2

## 2021-01-10 NOTE — Discharge Instructions (Addendum)
       Zena Amos, MD  Psychiatry  [facility]        [email or web address]...Zena Amos  Locations. [facility] [place]. [address]. [place] , [place] [address]. [phone].    Gender: Female   Phone: [phone]  .Marland Kitchen     Explore further       Information on my medicine - ELIQUIS (apixaban)  Why was Eliquis prescribed for you? Eliquis was prescribed to treat blood clots that may have been found in the veins of your legs (deep vein thrombosis) or in your lungs (pulmonary embolism) and to reduce the risk of them occurring again.  What do You need to know about Eliquis ? The starting dose is 10 mg (two 5 mg tablets) taken TWICE daily for the FIRST SEVEN (7) DAYS, then on (enter date)  01/18/21  the dose is reduced to ONE 5 mg tablet taken TWICE daily.  Eliquis may be taken with or without food.   Try to take the dose about the same time in the morning and in the evening. If you have difficulty swallowing the tablet whole please discuss with your pharmacist how to take the medication safely.  Take Eliquis exactly as prescribed and DO NOT stop taking Eliquis without talking to the doctor who prescribed the medication.  Stopping may increase your risk of developing a new blood clot.  Refill your prescription before you run out.  After discharge, you should have regular check-up appointments with your healthcare provider that is prescribing your Eliquis.    What do you do if you miss a dose? If a dose of ELIQUIS is not taken at the scheduled time, take it as soon as possible on the same day and twice-daily administration should be resumed. The dose should not be doubled to make up for a missed dose.  Important Safety Information A possible side effect of Eliquis is bleeding. You should call your healthcare provider right away if you experience any of the following: Bleeding from an  injury or your nose that does not stop. Unusual colored urine (red or dark brown) or unusual colored stools (red or black). Unusual bruising for unknown reasons. A serious fall or if you hit your head (even if there is no bleeding).  Some medicines may interact with Eliquis and might increase your risk of bleeding or clotting while on Eliquis. To help avoid this, consult your healthcare provider or pharmacist prior to using any new prescription or non-prescription medications, including herbals, vitamins, non-steroidal anti-inflammatory drugs (NSAIDs) and supplements.  This website has more information on Eliquis (apixaban): http://www.eliquis.com/eliquis/home

## 2021-01-10 NOTE — Evaluation (Signed)
.ptmedPhysical Therapy Evaluation Patient Details Name: Kristina Owens MRN: [id number] DOB: [birth year] Today's Date: 01/10/2021   History of Present Illness  55 year old female who presents with L leg pain for 2 weeks; also SOB without check pain, chronic cough. Bilateral LE Doppler - Positive LLE acute DVT involving the left common femoral, left femoral, popliteal, posterior tibial, peroneal, soleal and gastrocnemius veins. CTA chest - multiple bilateral PE including saddle embolus with CT evidence of right heart strain. PMH: anxiety, depression, GERD, bipolar, polysubstance abuse, alcohol abuse  Clinical Impression  Pt admitted with above diagnosis. Pt independent at baseline, with polysubstance abuse and vague regarding d/c plans. Pt currently requiring supv for safety due to sleepiness, tolerates limited ambulation in room without DME. Pt able to complete toileting with supv. Pt limited by LLE and chest tightness complaints. Pt on RA with SpO2 98% HR in 90s. Pt tolerates remaining up in chair with nurse tech getting bed linen and RN notified regarding pain medication request. Pt currently with functional limitations due to the deficits listed below (see PT Problem List). Pt will benefit from skilled PT to increase their independence and safety with mobility to allow discharge to the venue listed below.       Follow Up Recommendations No PT follow up    Equipment Recommendations  None recommended by PT    Recommendations for Other Services       Precautions / Restrictions Precautions Precautions: Fall Restrictions Weight Bearing Restrictions: No      Mobility  Bed Mobility Overal bed mobility: Modified Independent  General bed mobility comments: slow to mobilize to sitting EOB, no physical assist or VCs    Transfers Overall transfer level: Needs assistance Equipment used: None Transfers: Sit to/from [employer] Transfers Sit to Stand: Supervision Stand pivot  transfers: Supervision  General transfer comment: supv for safety for STS and stand pivot to [facility] due to sleepiness, no LOB  Ambulation/Gait Ambulation/Gait assistance: Supervision Gait Distance (Feet): 10 Feet Assistive device: None Gait Pattern/deviations: Step-to pattern;Shuffle;Trunk flexed Gait velocity: decreased   General Gait Details: slow steps from [facility] over to recliner, able to complete turns and navigate past furniture, maintains trunk flexed and reaches for furniture intermittently without LOB  Stairs            Wheelchair Mobility    Modified Rankin (Stroke Patients Only)       Balance Overall balance assessment: Mild deficits observed, not formally tested       Pertinent Vitals/Pain Pain Assessment: 0-10 Pain Score: 10-Worst pain ever Pain Location: LLE and chest Pain Descriptors / Indicators: Aching;Tightness Pain Intervention(s): Limited activity within patient's tolerance;Monitored during session;Patient requesting pain meds-RN notified;Repositioned    Home Living Family/patient expects to be discharged to:: Unsure Living Arrangements: Alone Available Help at Discharge: Friend(s);Available PRN/intermittently             Additional Comments: Pt appears unsure of d/c location plans, doesn't elaborate and states "somewhere".    Prior Function Level of Independence: Independent  Comments: Pt reports independent, drives, denies falls.     Hand Dominance        Extremity/Trunk Assessment   Upper Extremity Assessment Upper Extremity Assessment: Overall WFL for tasks assessed    Lower Extremity Assessment Lower Extremity Assessment: Overall WFL for tasks assessed (AROM WNL, strength grossly 4-/5, reports LLE tingling in foot)    Cervical / Trunk Assessment Cervical / Trunk Assessment: Normal  Communication   Communication: No difficulties  Cognition Arousal/Alertness: Awake/alert Behavior During  Therapy: Flat affect Overall Cognitive  Status: Within Functional Limits for tasks assessed  General Comments: Pt sleepy, but arousable, aware she is in the hospial, flat affect, follows commands appropriately.      General Comments      Exercises     Assessment/Plan    PT Assessment Patient needs continued PT services  PT Problem List Decreased activity tolerance;Decreased balance;Cardiopulmonary status limiting activity;Impaired sensation;Pain       PT Treatment Interventions DME instruction;Gait training;Functional mobility training;Therapeutic activities;Therapeutic exercise;Balance training;Patient/family education    PT Goals (Current goals can be found in the Care Plan section)  Acute Rehab PT Goals Patient Stated Goal: less pain PT Goal Formulation: With patient Time For Goal Achievement: 01/24/21 Potential to Achieve Goals: Good    Frequency Min 3X/week   Barriers to discharge        Co-evaluation               AM-PAC PT "6 Clicks" Mobility  Outcome Measure Help needed turning from your back to your side while in a flat bed without using bedrails?: None Help needed moving from lying on your back to sitting on the side of a flat bed without using bedrails?: None Help needed moving to and from a bed to a chair (including a wheelchair)?: A Little Help needed standing up from a chair using your arms (e.g., wheelchair or bedside chair)?: A Little Help needed to walk in hospital room?: A Little Help needed climbing 3-5 steps with a railing? : A Little 6 Click Score: 20    End of Session   Activity Tolerance: Patient limited by pain Patient left: in chair;with call bell/phone within reach;with chair alarm set Nurse Communication: Mobility status;Other (comment);Patient requests pain meds (needs new bed linen) PT Visit Diagnosis: Other abnormalities of gait and mobility (R26.89)    Time: 0076-2263 PT Time Calculation (min) (ACUTE ONLY): 12 min   Charges:   PT Evaluation $PT Eval Low  Complexity: 1 Low           Tori Felma Pfefferle PT, DPT 01/10/21, 10:45 AM

## 2021-01-10 NOTE — Progress Notes (Signed)
PROGRESS NOTE    Kristina Owens  VZD:[id number] DOB: [birth year] DOA: 01/08/2021 PCP: Greer Ee., FNP   Brief Narrative: This 55 years old female with PMH significant for anxiety, depression, GERD, bipolar, polysubstance abuse, alcohol abuse presented in the ED with 2 weeks history of left leg pain.  Patient was seen in the ED on 5/31 for alcohol withdrawal and at the time she was noted to have left ankle pain.  she had a D-dimer 1.3 and was advised to get an outpatient ultrasound but this did not happen.  Patient most recently used crack cocaine and methamphetamine and alcohol one day prior to admission.  She denies any IV drug use.  She does report history of tremors and night sweats  from withdrawal.  She denies any history of cancer, VTE, OCP use, recent surgery, recent travel but does smoke cigarettes regularly.  Bilateral lower extremity Duplex: positive for left lower extremity acute DVT involving left common femoral, left femoral, popliteal, posterior tibial, peroneal, soleal and gastrocnemius veins.  CTA chest shows multiple bilateral PE including saddle embolus with CT evidence of right heart strain. Patient was admitted for DVT and acute PE and started on Lovenox.  Assessment & Plan:   Active Problems:   Pulmonary embolism (HCC)   DVT (deep venous thrombosis) (HCC)   Polysubstance (excluding opioids) dependence (HCC)   Alcohol abuse with withdrawal (HCC)   Acute unprovoked left LE DVT /Acute saddle PE with right heart strain. Patient is hemodynamically stable. Bilateral LE Duplex : Positive L LE acute DVT involving left common femoral, left femoral, popliteal, posterior tibial, peroneal, soleal and gastrocnemius veins. CTA chest shows multiple bilateral PE including saddle embolus with right heart strain. Continue therapeutic Lovenox  IR consulted: Patient is not a candidate for thrombectomy,  reconsult IR if patient is unstable. Pharmacy consulted to transition to  Eliquis.   Polysubstance abuse: Patient reported recent use of alcohol, crack cocaine and methamphetamine 1 day prior to hospitalization Patient is interested to go into rehab and wishes to detox Transition of care consulted.  Alcohol abuse and suspected alcohol withdrawal.: Continue CIWA protocol  Anxiety disorder, bipolar, depression Continue home medications.  Tobacco use: Continue nicotine patch, advised smoking cessation  DVT prophylaxis: Lovenox Code Status: Full code. Family Communication:No family at bedside. Disposition Plan:  Status is: Inpatient  Remains inpatient appropriate because:Inpatient level of care appropriate due to severity of illness   Dispo: The patient is from: Home              Anticipated d/c is to: Home              Patient currently is not medically stable to d/c.   Difficult to place patient No   Consultants:   None  Procedures: Venous duplex, CTA chest. Antimicrobials:  Anti-infectives (From admission, onward)    None       Subjective: Patient was seen and examined at bedside.  Overnight events noted.   She reports worsening left leg pain,  reports not able to walk.   Patient also reports chest soreness but denies any difficulty breathing.  Objective: Vitals:   01/09/21 2047 01/10/21 0620 01/10/21 0630 01/10/21 1301  BP: 109/86 92/76  103/60  Pulse: (!) 103 89  (!) 110  Resp: 20 20  20   Temp: 99 F (37.2 C) 98.2 F (36.8 C)  98.7 F (37.1 C)  TempSrc: Oral Oral    SpO2: 97%   96%  Weight:  Height:   5\' 3"  (1.6 m)    No intake or output data in the 24 hours ending 01/10/21 1327  Filed Weights   01/08/21 1207  Weight: 68 kg    Examination:  General exam: Appears calm and comfortable , not in any acute distress. Respiratory system: Clear to auscultation. Respiratory effort normal. Cardiovascular system: S1 & S2 heard, RRR. No JVD, murmurs, rubs, gallops or clicks. No pedal edema. Gastrointestinal system:  Abdomen is nondistended, soft and nontender. No organomegaly or masses felt. Normal bowel sounds heard. Central nervous system: Alert and oriented. No focal neurological deficits. Extremities: Symmetric 5 x 5 power.  Left leg calf tenderness noted. Skin: No rashes, lesions or ulcers Psychiatry: Judgement and insight appear normal. Mood & affect appropriate.     Data Reviewed: I have personally reviewed following labs and imaging studies  CBC: Recent Labs  Lab 01/08/21 1220 01/09/21 0518 01/10/21 0513  WBC 7.9 7.6 8.6  NEUTROABS 4.6  --   --   HGB 15.3* 14.4 15.0  HCT 48.1* 45.7 47.6*  MCV 94.3 96.2 95.6  PLT 170 141* 162   Basic Metabolic Panel: Recent Labs  Lab 01/08/21 0915 01/08/21 1740 01/09/21 0518 01/10/21 0513  NA 139  --  139 134*  K 3.8  --  3.1* 4.4  CL 100  --  102 103  CO2 28  --  31 26  GLUCOSE 112*  --  107* 103*  BUN <5*  --  8 8  CREATININE 0.75  --  0.76 0.72  CALCIUM 8.8*  --  8.2* 8.2*  MG  --  2.0  --   --    GFR: Estimated Creatinine Clearance: 75.2 mL/min (by C-G formula based on SCr of 0.72 mg/dL). Liver Function Tests: No results for input(s): AST, ALT, ALKPHOS, BILITOT, PROT, ALBUMIN in the last 168 hours. No results for input(s): LIPASE, AMYLASE in the last 168 hours. No results for input(s): AMMONIA in the last 168 hours. Coagulation Profile: Recent Labs  Lab 01/08/21 0915  INR 1.1   Cardiac Enzymes: No results for input(s): CKTOTAL, CKMB, CKMBINDEX, TROPONINI in the last 168 hours. BNP (last 3 results) No results for input(s): PROBNP in the last 8760 hours. HbA1C: No results for input(s): HGBA1C in the last 72 hours. CBG: No results for input(s): GLUCAP in the last 168 hours. Lipid Profile: No results for input(s): CHOL, HDL, LDLCALC, TRIG, CHOLHDL, LDLDIRECT in the last 72 hours. Thyroid Function Tests: No results for input(s): TSH, T4TOTAL, FREET4, T3FREE, THYROIDAB in the last 72 hours. Anemia Panel: No results for  input(s): VITAMINB12, FOLATE, FERRITIN, TIBC, IRON, RETICCTPCT in the last 72 hours. Sepsis Labs: No results for input(s): PROCALCITON, LATICACIDVEN in the last 168 hours.  Recent Results (from the past 240 hour(s))  Resp Panel by RT-PCR (Flu A&B, Covid) Nasopharyngeal Swab     Status: None   Collection Time: 01/08/21 12:20 PM   Specimen: Nasopharyngeal Swab; Nasopharyngeal(NP) swabs in vial transport medium  Result Value Ref Range Status   SARS Coronavirus 2 by RT PCR NEGATIVE NEGATIVE Final    Comment: (NOTE) SARS-CoV-2 target nucleic acids are NOT DETECTED.  The SARS-CoV-2 RNA is generally detectable in upper respiratory specimens during the acute phase of infection. The lowest concentration of SARS-CoV-2 viral copies this assay can detect is 138 copies/mL. A negative result does not preclude SARS-Cov-2 infection and should not be used as the sole basis for treatment or other patient management decisions. A negative result may  occur with  improper specimen collection/handling, submission of specimen other than nasopharyngeal swab, presence of viral mutation(s) within the areas targeted by this assay, and inadequate number of viral copies(<138 copies/mL). A negative result must be combined with clinical observations, patient history, and epidemiological information. The expected result is Negative.  Fact Sheet for Patients:  BloggerCourse.com  Fact Sheet for Healthcare Providers:  SeriousBroker.it  This test is no t yet approved or cleared by the [place] FDA and  has been authorized for detection and/or diagnosis of SARS-CoV-2 by FDA under an Emergency Use Authorization (EUA). This EUA will remain  in effect (meaning this test can be used) for the duration of the COVID-19 declaration under Section 564(b)(1) of the Act, 21 U.S.C.section 360bbb-3(b)(1), unless the authorization is terminated  or revoked sooner.        Influenza A by PCR NEGATIVE NEGATIVE Final   Influenza B by PCR NEGATIVE NEGATIVE Final    Comment: (NOTE) The Xpert Xpress SARS-CoV-2/FLU/RSV plus assay is intended as an aid in the diagnosis of influenza from Nasopharyngeal swab specimens and should not be used as a sole basis for treatment. Nasal washings and aspirates are unacceptable for Xpert Xpress SARS-CoV-2/FLU/RSV testing.  Fact Sheet for Patients: BloggerCourse.com  Fact Sheet for Healthcare Providers: SeriousBroker.it  This test is not yet approved or cleared by the [place] FDA and has been authorized for detection and/or diagnosis of SARS-CoV-2 by FDA under an Emergency Use Authorization (EUA). This EUA will remain in effect (meaning this test can be used) for the duration of the COVID-19 declaration under Section 564(b)(1) of the Act, 21 U.S.C. section 360bbb-3(b)(1), unless the authorization is terminated or revoked.  Performed at [facility], [address] [place], [place] [address]     Radiology Studies: No results found.  Scheduled Meds:  apixaban  10 mg Oral BID   Followed by   Melene Muller ON 01/17/2021] apixaban  5 mg Oral BID   ARIPiprazole  10 mg Oral Daily   busPIRone  10 mg Oral TID   folic acid  1 mg Oral Daily   lamoTRIgine  25 mg Oral Daily   multivitamin with minerals  1 tablet Oral Daily   nicotine  21 mg Transdermal Daily   sertraline  50 mg Oral Daily   sodium chloride flush  3 mL Intravenous Q12H   thiamine  100 mg Oral Daily   Or   thiamine  100 mg Intravenous Daily   Continuous Infusions:   LOS: 2 days    Time spent: 25 mins    Cipriano Bunker, MD [facility]   If 7PM-7AM, please contact night-coverage

## 2021-01-11 ENCOUNTER — Inpatient Hospital Stay (HOSPITAL_COMMUNITY): Payer: Self-pay

## 2021-01-11 LAB — CBC
HCT: 44.1 % (ref 36.0–46.0)
Hemoglobin: 14.3 g/dL (ref 12.0–15.0)
MCH: 30.4 pg (ref 26.0–34.0)
MCHC: 32.4 g/dL (ref 30.0–36.0)
MCV: 93.6 fL (ref 80.0–100.0)
Platelets: 201 10*3/uL (ref 150–400)
RBC: 4.71 MIL/uL (ref 3.87–5.11)
RDW: 15.8 % — ABNORMAL HIGH (ref 11.5–15.5)
WBC: 12 10*3/uL — ABNORMAL HIGH (ref 4.0–10.5)
nRBC: 0 % (ref 0.0–0.2)

## 2021-01-11 LAB — LACTIC ACID, PLASMA
Lactic Acid, Venous: 0.6 mmol/L (ref 0.5–1.9)
Lactic Acid, Venous: 0.7 mmol/L (ref 0.5–1.9)

## 2021-01-11 LAB — URINALYSIS, ROUTINE W REFLEX MICROSCOPIC
Bilirubin Urine: NEGATIVE
Glucose, UA: NEGATIVE mg/dL
Ketones, ur: 20 mg/dL — AB
Leukocytes,Ua: NEGATIVE
Nitrite: POSITIVE — AB
Protein, ur: NEGATIVE mg/dL
Specific Gravity, Urine: 1.023 (ref 1.005–1.030)
pH: 5 (ref 5.0–8.0)

## 2021-01-11 LAB — BASIC METABOLIC PANEL
Anion gap: 10 (ref 5–15)
BUN: 10 mg/dL (ref 6–20)
CO2: 22 mmol/L (ref 22–32)
Calcium: 8.2 mg/dL — ABNORMAL LOW (ref 8.9–10.3)
Chloride: 98 mmol/L (ref 98–111)
Creatinine, Ser: 0.61 mg/dL (ref 0.44–1.00)
GFR, Estimated: >60 mL/min (ref >60)
Glucose, Bld: 107 mg/dL — ABNORMAL HIGH (ref 70–99)
Potassium: 4.1 mmol/L (ref 3.5–5.1)
Sodium: 130 mmol/L — ABNORMAL LOW (ref 135–145)

## 2021-01-11 LAB — PROCALCITONIN: Procalcitonin: <0.1 ng/mL

## 2021-01-11 MED ORDER — SODIUM CHLORIDE 0.9 % IV SOLN
2.0000 g | INTRAVENOUS | Status: AC
  Administered 2021-01-11 – 2021-01-17 (×7): 2 g via INTRAVENOUS
  Filled 2021-01-11: qty 20
  Filled 2021-01-11 (×6): qty 2

## 2021-01-11 MED ORDER — SODIUM CHLORIDE 0.9 % IV SOLN
500.0000 mg | INTRAVENOUS | Status: DC
  Administered 2021-01-11 – 2021-01-12 (×2): 500 mg via INTRAVENOUS
  Filled 2021-01-11 (×3): qty 500

## 2021-01-11 MED ORDER — SODIUM CHLORIDE 0.9 % IV SOLN: INTRAVENOUS | Status: DC

## 2021-01-11 MED ORDER — LORAZEPAM 2 MG/ML IJ SOLN
0.5000 mg | Freq: Once | INTRAMUSCULAR | Status: AC
  Administered 2021-01-11: 0.5 mg via INTRAVENOUS
  Filled 2021-01-11: qty 1

## 2021-01-11 NOTE — Progress Notes (Signed)
   01/11/21 1031  Assess: MEWS Score  Temp (!) 101.1 F (38.4 C)  BP 98/63  Pulse Rate (!) 101  Resp (!) 34  Level of Consciousness Alert  SpO2 95 %  O2 Device Room Air  Assess: MEWS Score  MEWS Temp 1  MEWS Systolic 1  MEWS Pulse 1  MEWS RR 2  MEWS LOC 0  MEWS Score 5  MEWS Score Color Red  Assess: if the MEWS score is Yellow or Red  Were vital signs taken at a resting state? Yes  Focused Assessment Change from prior assessment (see assessment flowsheet)  Does the patient meet 2 or more of the SIRS criteria? Yes  Does the patient have a confirmed or suspected source of infection? Yes  Provider and Rapid Response Notified? Yes  MEWS guidelines implemented *See Row Information* Yes  Treat  MEWS Interventions Administered prn meds/treatments;Escalated (See documentation below)  Pain Scale 0-10  Pain Score 10  Pain Type Acute pain  Pain Location Back  Pain Orientation Left  Pain Descriptors / Indicators Sore  Pain Frequency Constant  Pain Onset On-going  Patients Stated Pain Goal 3  Pain Intervention(s) Medication (See eMAR)  Multiple Pain Sites Yes  2nd Pain Site  Pain Score 10  Wong-Baker Pain Rating 10  Pain Type Acute pain  Pain Location Leg  Pain Orientation Left  Pain Descriptors / Indicators Sore  Pain Frequency Constant  Pain Onset On-going  Patient's Stated Pain Goal 3  Pain Intervention(s) Medication (See eMAR)  Take Vital Signs  Increase Vital Sign Frequency  Red: Q 1hr X 4 then Q 4hr X 4, if remains red, continue Q 4hrs  Escalate  MEWS: Escalate Red: discuss with charge nurse/RN and provider, consider discussing with RRT  Notify: Charge Nurse/RN  Name of Charge Nurse/RN Notified Launa Flight  Date Charge Nurse/RN Notified 01/11/21  Time Charge Nurse/RN Notified 1300  Notify: Provider  Provider Name/Title Casper Harrison  Date Provider Notified 01/11/21  Time Provider Notified 1030  Notification Type Page  Notification Reason Change in status  Provider  response See new orders  Date of Provider Response 01/11/21  Time of Provider Response 1033  Notify: Rapid Response  Name of Rapid Response RN Notified Alexis Heavner  Date Rapid Response Notified 01/11/21  Time Rapid Response Notified 1030  Assess: SIRS CRITERIA  SIRS Temperature  1  SIRS Pulse 1  SIRS Respirations  1

## 2021-01-11 NOTE — Progress Notes (Signed)
PROGRESS NOTE    Kristina Owens  IZT:[id number] DOB: 02-06-1966 DOA: 01/08/2021 PCP: Haywood Filler., FNP   Brief Narrative: This 55 years old female with PMH significant for anxiety, depression, GERD, bipolar, polysubstance abuse, alcohol abuse presented in the ED with 2 weeks history of left leg pain.  Patient was seen in the ED on 5/31 for alcohol withdrawal and at the time she was noted to have left ankle pain.  she had a D-dimer 1.3 and was advised to get an outpatient ultrasound but this did not happen.  Patient most recently used crack cocaine and methamphetamine and alcohol one day prior to admission.  She denies any IV drug use.  She does report history of tremors and night sweats  from withdrawal.  She denies any history of cancer, VTE, OCP use, recent surgery, recent travel but does smoke cigarettes regularly.  Bilateral lower extremity Duplex: positive for left lower extremity acute DVT involving left common femoral, left femoral, popliteal, posterior tibial, peroneal, soleal and gastrocnemius veins.  CTA chest shows multiple bilateral PE including saddle embolus with CT evidence of right heart strain.  Patient was admitted for DVT and acute PE and started on Lovenox. She was successfully transitioned on Eliquis.  Patient has spiked fever, UA consistent with UTI chest x-ray shows infiltrate is started on empiric antibiotics following of blood and urine cultures.  Assessment & Plan:   Active Problems:   Pulmonary embolism (HCC)   DVT (deep venous thrombosis) (HCC)   Polysubstance (excluding opioids) dependence (HCC)   Alcohol abuse with withdrawal ([place])   Acute unprovoked left LE DVT /Acute saddle PE with right heart strain. Patient was hemodynamically stable. Bilateral LE Duplex : Positive L LE acute DVT involving left common femoral, left femoral, popliteal, posterior tibial, peroneal, soleal and gastrocnemius veins. CTA chest shows multiple bilateral PE including saddle  embolus with right heart strain. Continue therapeutic Lovenox  IR consulted: Patient is not a candidate for thrombectomy,  reconsult IR if patient is unstable. Lovenox successfully transitioned to Eliquis.  Sepsis secondary to UTI/pneumonia. Patient has spiked fever 101.4,  met sepsis criteria with leukocytosis, tachycardia, tachypnea , fever, focus UTI. UA consistent with UTI and chest x-ray shows infiltrate Will start ceftriaxone and Zithromax for CAP and UTI Follow-up urine and blood cultures.  Lactic acid and procalcitonin pending  Polysubstance abuse: Patient reported recent use of alcohol, crack cocaine and methamphetamine 1 day prior to hospitalization Patient is interested to go into rehab and wishes to detox Transition of care consulted.  Alcohol abuse and suspected alcohol withdrawal.: Continue CIWA protocol  Anxiety disorder, bipolar, depression Continue home medications.  Tobacco use: Continue nicotine patch, advised smoking cessation  DVT prophylaxis: Lovenox Code Status: Full code. Family Communication:No family at bedside. Disposition Plan:  Status is: Inpatient  Remains inpatient appropriate because:Inpatient level of care appropriate due to severity of illness   Dispo: The patient is from: Home              Anticipated d/c is to: Home              Patient currently is not medically stable to d/c.   Difficult to place patient No   Consultants:   None  Procedures: Venous duplex, CTA chest. Antimicrobials:  Anti-infectives (From admission, onward)    Start     Dose/Rate Route Frequency Ordered Stop   01/11/21 1400  azithromycin (ZITHROMAX) 500 mg in sodium chloride 0.9 % 250 mL IVPB  500 mg 250 mL/hr over 60 Minutes Intravenous Every 24 hours 01/11/21 1254     01/11/21 1300  cefTRIAXone (ROCEPHIN) 2 g in sodium chloride 0.9 % 100 mL IVPB        2 g 200 mL/hr over 30 Minutes Intravenous Every 24 hours 01/11/21 1254          Subjective: Patient was seen and examined at bedside.  Overnight events noted.  She spiked fever 101.4 She reports chronic back pain, not able to walk. Patient also reports chest soreness but denies any difficulty breathing.   Objective: Vitals:   01/11/21 0925 01/11/21 1031 01/11/21 1219 01/11/21 1300  BP: 99/64 98/63 112/76   Pulse: 99 (!) 101 97   Resp: 20 (!) 34 (!) 27 (!) 24  Temp: (!) 101.4 F (38.6 C) (!) 101.1 F (38.4 C) 99.6 F (37.6 C) 98.6 F (37 C)  TempSrc: Oral Oral Oral   SpO2: 91% 95% 92%   Weight:      Height:        Intake/Output Summary (Last 24 hours) at 01/11/2021 1350 Last data filed at 01/11/2021 0300 Gross per 24 hour  Intake --  Output 300 ml  Net -300 ml    Filed Weights   01/08/21 1207  Weight: 68 kg    Examination:  General exam: Appears calm and comfortable , not in any acute distress. Respiratory system: Clear to auscultation. Respiratory effort normal. Cardiovascular system: S1 & S2 heard, RRR. No JVD, murmurs, rubs, gallops or clicks. No pedal edema. Gastrointestinal system: Abdomen is nondistended, soft and nontender. No organomegaly or masses felt. Normal bowel sounds heard. Central nervous system: Alert and oriented. No focal neurological deficits. Extremities: Symmetric 5 x 5 power.  Left leg calf tenderness noted. Skin: No rashes, lesions or ulcers Psychiatry: Judgement and insight appear normal. Mood & affect appropriate.     Data Reviewed: I have personally reviewed following labs and imaging studies  CBC: Recent Labs  Lab 01/08/21 1220 01/09/21 0518 01/10/21 0513 01/11/21 0454  WBC 7.9 7.6 8.6 12.0*  NEUTROABS 4.6  --   --   --   HGB 15.3* 14.4 15.0 14.3  HCT 48.1* 45.7 47.6* 44.1  MCV 94.3 96.2 95.6 93.6  PLT 170 141* 162 416   Basic Metabolic Panel: Recent Labs  Lab 01/08/21 0915 01/08/21 1740 01/09/21 0518 01/10/21 0513 01/11/21 0454  NA 139  --  139 134* 130*  K 3.8  --  3.1* 4.4 4.1  CL 100   --  102 103 98  CO2 28  --  31 26 22   GLUCOSE 112*  --  107* 103* 107*  BUN <5*  --  8 8 10   CREATININE 0.75  --  0.76 0.72 0.61  CALCIUM 8.8*  --  8.2* 8.2* 8.2*  MG  --  2.0  --   --   --    GFR: Estimated Creatinine Clearance: 75.2 mL/min (by C-G formula based on SCr of 0.61 mg/dL). Liver Function Tests: No results for input(s): AST, ALT, ALKPHOS, BILITOT, PROT, ALBUMIN in the last 168 hours. No results for input(s): LIPASE, AMYLASE in the last 168 hours. No results for input(s): AMMONIA in the last 168 hours. Coagulation Profile: Recent Labs  Lab 01/08/21 0915  INR 1.1   Cardiac Enzymes: No results for input(s): CKTOTAL, CKMB, CKMBINDEX, TROPONINI in the last 168 hours. BNP (last 3 results) No results for input(s): PROBNP in the last 8760 hours. HbA1C: No results for input(s):  HGBA1C in the last 72 hours. CBG: No results for input(s): GLUCAP in the last 168 hours. Lipid Profile: No results for input(s): CHOL, HDL, LDLCALC, TRIG, CHOLHDL, LDLDIRECT in the last 72 hours. Thyroid Function Tests: No results for input(s): TSH, T4TOTAL, FREET4, T3FREE, THYROIDAB in the last 72 hours. Anemia Panel: No results for input(s): VITAMINB12, FOLATE, FERRITIN, TIBC, IRON, RETICCTPCT in the last 72 hours. Sepsis Labs: Recent Labs  Lab 01/11/21 1254  LATICACIDVEN 0.6    Recent Results (from the past 240 hour(s))  Resp Panel by RT-PCR (Flu A&B, Covid) Nasopharyngeal Swab     Status: None   Collection Time: 01/08/21 12:20 PM   Specimen: Nasopharyngeal Swab; Nasopharyngeal(NP) swabs in vial transport medium  Result Value Ref Range Status   SARS Coronavirus 2 by RT PCR NEGATIVE NEGATIVE Final    Comment: (NOTE) SARS-CoV-2 target nucleic acids are NOT DETECTED.  The SARS-CoV-2 RNA is generally detectable in upper respiratory specimens during the acute phase of infection. The lowest concentration of SARS-CoV-2 viral copies this assay can detect is 138 copies/mL. A negative result  does not preclude SARS-Cov-2 infection and should not be used as the sole basis for treatment or other patient management decisions. A negative result may occur with  improper specimen collection/handling, submission of specimen other than nasopharyngeal swab, presence of viral mutation(s) within the areas targeted by this assay, and inadequate number of viral copies(<138 copies/mL). A negative result must be combined with clinical observations, patient history, and epidemiological information. The expected result is Negative.  Fact Sheet for Patients:  EntrepreneurPulse.com.au  Fact Sheet for Healthcare Providers:  IncredibleEmployment.be  This test is no t yet approved or cleared by the [place] FDA and  has been authorized for detection and/or diagnosis of SARS-CoV-2 by FDA under an Emergency Use Authorization (EUA). This EUA will remain  in effect (meaning this test can be used) for the duration of the COVID-19 declaration under Section 564(b)(1) of the Act, 21 U.S.C.section 360bbb-3(b)(1), unless the authorization is terminated  or revoked sooner.       Influenza A by PCR NEGATIVE NEGATIVE Final   Influenza B by PCR NEGATIVE NEGATIVE Final    Comment: (NOTE) The Xpert Xpress SARS-CoV-2/FLU/RSV plus assay is intended as an aid in the diagnosis of influenza from Nasopharyngeal swab specimens and should not be used as a sole basis for treatment. Nasal washings and aspirates are unacceptable for Xpert Xpress SARS-CoV-2/FLU/RSV testing.  Fact Sheet for Patients: EntrepreneurPulse.com.au  Fact Sheet for Healthcare Providers: IncredibleEmployment.be  This test is not yet approved or cleared by the [place] FDA and has been authorized for detection and/or diagnosis of SARS-CoV-2 by FDA under an Emergency Use Authorization (EUA). This EUA will remain in effect (meaning this test can be used) for  the duration of the COVID-19 declaration under Section 564(b)(1) of the Act, 21 U.S.C. section 360bbb-3(b)(1), unless the authorization is terminated or revoked.  Performed at [facility], [place] [address] [place], [place] [address]     Radiology Studies: DG CHEST PORT 1 VIEW  Result Date: 01/11/2021 CLINICAL DATA:  Pneumonia. EXAM: PORTABLE CHEST 1 VIEW COMPARISON:  None. FINDINGS: The heart size and mediastinal contours are within normal limits. Right lung is clear. Mild left basilar atelectasis or infiltrate is noted. The visualized skeletal structures are unremarkable. IMPRESSION: Mild left basilar atelectasis or infiltrate. Electronically Signed   By: Marijo Conception M.D.   On: 01/11/2021 11:54    Scheduled Meds:  apixaban  10 mg Oral BID   Followed by   Derrill Memo ON 01/18/2021] apixaban  5 mg Oral BID   ARIPiprazole  10 mg Oral Daily   busPIRone  10 mg Oral TID   carvedilol  3.125 mg Oral BID WC   folic acid  1 mg Oral Daily   lamoTRIgine  25 mg Oral Daily   multivitamin with minerals  1 tablet Oral Daily   nicotine  21 mg Transdermal Daily   sertraline  50 mg Oral Daily   sodium chloride flush  3 mL Intravenous Q12H   thiamine  100 mg Oral Daily   Or   thiamine  100 mg Intravenous Daily   Continuous Infusions:  sodium chloride 100 mL/hr at 01/11/21 1119   azithromycin     cefTRIAXone (ROCEPHIN)  IV       LOS: 3 days    Time spent: 25 mins    Shawna Clamp, MD [facility]   If 7PM-7AM, please contact night-coverage

## 2021-01-12 LAB — MAGNESIUM: Magnesium: 1.9 mg/dL (ref 1.7–2.4)

## 2021-01-12 LAB — BASIC METABOLIC PANEL
Anion gap: 8 (ref 5–15)
BUN: 8 mg/dL (ref 6–20)
CO2: 23 mmol/L (ref 22–32)
Calcium: 8.1 mg/dL — ABNORMAL LOW (ref 8.9–10.3)
Chloride: 102 mmol/L (ref 98–111)
Creatinine, Ser: 0.39 mg/dL — ABNORMAL LOW (ref 0.44–1.00)
GFR, Estimated: >60 mL/min (ref >60)
Glucose, Bld: 83 mg/dL (ref 70–99)
Potassium: 4.1 mmol/L (ref 3.5–5.1)
Sodium: 133 mmol/L — ABNORMAL LOW (ref 135–145)

## 2021-01-12 LAB — PHOSPHORUS: Phosphorus: 2.4 mg/dL — ABNORMAL LOW (ref 2.5–4.6)

## 2021-01-12 LAB — CBC
HCT: 38.7 % (ref 36.0–46.0)
Hemoglobin: 12.5 g/dL (ref 12.0–15.0)
MCH: 30 pg (ref 26.0–34.0)
MCHC: 32.3 g/dL (ref 30.0–36.0)
MCV: 93 fL (ref 80.0–100.0)
Platelets: 219 10*3/uL (ref 150–400)
RBC: 4.16 MIL/uL (ref 3.87–5.11)
RDW: 15.6 % — ABNORMAL HIGH (ref 11.5–15.5)
WBC: 8.4 10*3/uL (ref 4.0–10.5)
nRBC: 0 % (ref 0.0–0.2)

## 2021-01-12 MED ORDER — BENZONATATE 100 MG PO CAPS
100.0000 mg | ORAL_CAPSULE | Freq: Three times a day (TID) | ORAL | Status: DC | PRN
  Administered 2021-01-12 – 2021-01-15 (×6): 100 mg via ORAL
  Filled 2021-01-12 (×6): qty 1

## 2021-01-12 NOTE — Progress Notes (Signed)
PROGRESS NOTE    Kristina Owens  YTW:[id number] DOB: 30-Dec-1965 DOA: 01/08/2021 PCP: Haywood Filler., FNP   Brief Narrative: This 55 years old female with PMH significant for anxiety, depression, GERD, bipolar, polysubstance abuse, alcohol abuse presented in the ED with 2 weeks history of left leg pain.  Patient was seen in the ED on 5/31 for alcohol withdrawal and at the time she was noted to have left ankle pain.  she had a D-dimer 1.3 and was advised to get an outpatient ultrasound but this did not happen.  Patient most recently used crack cocaine and methamphetamine and alcohol one day prior to admission.  She denies any IV drug use.  She does report history of tremors and night sweats  from withdrawal.  She denies any history of cancer, VTE, OCP use, recent surgery, recent travel but does smoke cigarettes regularly.  Bilateral lower extremity Duplex: positive for left lower extremity acute DVT involving left common femoral, left femoral, popliteal, posterior tibial, peroneal, soleal and gastrocnemius veins.  CTA chest shows multiple bilateral PE including saddle embolus with CT evidence of right heart strain.  Patient was admitted for DVT and acute PE and started on Lovenox. She was successfully transitioned on Eliquis.  Patient has spiked fever, UA consistent with UTI , chest x-ray shows infiltrate and started on empiric antibiotics, following of blood and urine cultures.  Assessment & Plan:   Active Problems:   Pulmonary embolism (HCC)   DVT (deep venous thrombosis) (HCC)   Polysubstance (excluding opioids) dependence (HCC)   Alcohol abuse with withdrawal ([place])   Acute unprovoked left LE DVT /Acute saddle PE with right heart strain. Patient was hemodynamically stable. Bilateral LE Duplex : Positive L LE acute DVT involving left common femoral, left femoral, popliteal, posterior tibial, peroneal, soleal and gastrocnemius veins. CTA chest shows multiple bilateral PE including  saddle embolus with right heart strain. Started on therapeutic Lovenox  IR consulted: Patient is not a candidate for thrombectomy,  reconsult IR if patient is unstable. Lovenox successfully transitioned to Eliquis.  Sepsis secondary to UTI/pneumonia. Patient has spiked fever 101.4, 100.9 met sepsis criteria with leukocytosis, tachycardia, tachypnea , fever, focus UTI. UA consistent with UTI and chest x-ray shows infiltrate Continue ceftriaxone and Zithromax for CAP and UTI Follow-up urine and blood cultures.  Lactic acid normal, procalcitonin level 0.10  Polysubstance abuse: Patient reported recent use of alcohol, crack cocaine and methamphetamine 1 day prior to hospitalization Patient is interested to go into rehab and wishes to detox Transition of care consulted.  Alcohol abuse and suspected alcohol withdrawal.: Continue CIWA protocol  Anxiety disorder, bipolar, depression Continue home medications. Patient seems very depressed. Psych consulted.  Tobacco use: Continue nicotine patch, advised smoking cessation  DVT prophylaxis: Eliquis Code Status: Full code. Family Communication: No family at bedside. Disposition Plan:  Status is: Inpatient  Remains inpatient appropriate because:Inpatient level of care appropriate due to severity of illness   Dispo: The patient is from: Home              Anticipated d/c is to: Home in 1-2 days.              Patient currently is not medically stable to d/c.   Difficult to place patient No   Consultants:   None  Procedures: Venous duplex, CTA chest. Antimicrobials:  Anti-infectives (From admission, onward)    Start     Dose/Rate Route Frequency Ordered Stop   01/11/21 1400  azithromycin (ZITHROMAX) 500 mg  in sodium chloride 0.9 % 250 mL IVPB        500 mg 250 mL/hr over 60 Minutes Intravenous Every 24 hours 01/11/21 1254     01/11/21 1300  cefTRIAXone (ROCEPHIN) 2 g in sodium chloride 0.9 % 100 mL IVPB        2 g 200 mL/hr  over 30 Minutes Intravenous Every 24 hours 01/11/21 1254         Subjective: Patient was seen and examined at bedside.  Overnight events noted.   She reports chronic back pain, left leg pain, hurts when she walks. Patient also reports chest soreness but denies any difficulty breathing.   Objective: Vitals:   01/11/21 1927 01/11/21 2020 01/12/21 0819 01/12/21 0933  BP: 108/72   101/66  Pulse: 84   95  Resp: 15     Temp: 98.4 F (36.9 C) 98.6 F (37 C) (!) 100.9 F (38.3 C)   TempSrc: Oral Oral Oral   SpO2: 97%     Weight:      Height:        Intake/Output Summary (Last 24 hours) at 01/12/2021 1236 Last data filed at 01/11/2021 1748 Gross per 24 hour  Intake --  Output 600 ml  Net -600 ml    Filed Weights   01/08/21 1207  Weight: 68 kg    Examination:  General exam: Appears calm and comfortable , reports in a lot of pain. Respiratory system: Clear to auscultation. Respiratory effort normal. Cardiovascular system: S1 & S2 heard, RRR. No JVD, murmurs, rubs, gallops or clicks. No pedal edema. Gastrointestinal system: Abdomen is nondistended, soft and nontender. No organomegaly or masses felt. Normal bowel sounds heard. Central nervous system: Alert and oriented. No focal neurological deficits. Extremities: No edema, no cyanosis, no clubbing,  Left leg calf tenderness noted. Skin: No rashes, lesions or ulcers Psychiatry: Judgement and insight appear normal. Mood & affect appropriate.     Data Reviewed: I have personally reviewed following labs and imaging studies  CBC: Recent Labs  Lab 01/08/21 1220 01/09/21 0518 01/10/21 0513 01/11/21 0454 01/12/21 0533  WBC 7.9 7.6 8.6 12.0* 8.4  NEUTROABS 4.6  --   --   --   --   HGB 15.3* 14.4 15.0 14.3 12.5  HCT 48.1* 45.7 47.6* 44.1 38.7  MCV 94.3 96.2 95.6 93.6 93.0  PLT 170 141* 162 201 937   Basic Metabolic Panel: Recent Labs  Lab 01/08/21 0915 01/08/21 1740 01/09/21 0518 01/10/21 0513 01/11/21 0454  01/12/21 0533  NA 139  --  139 134* 130* 133*  K 3.8  --  3.1* 4.4 4.1 4.1  CL 100  --  102 103 98 102  CO2 28  --  _0 GLUCOSE 112*  --  107* 103* 107* 83  BUN <5*  --  _1 CREATININE 0.75  --  0.76 0.72 0.61 0.39*  CALCIUM 8.8*  --  8.2* 8.2* 8.2* 8.1*  MG  --  2.0  --   --   --  1.9  PHOS  --   --   --   --   --  2.4*   GFR: Estimated Creatinine Clearance: 75.2 mL/min (A) (by C-G formula based on SCr of 0.39 mg/dL (L)). Liver Function Tests: No results for input(s): AST, ALT, ALKPHOS, BILITOT, PROT, ALBUMIN in the last 168 hours. No results for input(s): LIPASE, AMYLASE in the last 168 hours. No results for input(s): AMMONIA in the  last 168 hours. Coagulation Profile: Recent Labs  Lab 01/08/21 0915  INR 1.1   Cardiac Enzymes: No results for input(s): CKTOTAL, CKMB, CKMBINDEX, TROPONINI in the last 168 hours. BNP (last 3 results) No results for input(s): PROBNP in the last 8760 hours. HbA1C: No results for input(s): HGBA1C in the last 72 hours. CBG: No results for input(s): GLUCAP in the last 168 hours. Lipid Profile: No results for input(s): CHOL, HDL, LDLCALC, TRIG, CHOLHDL, LDLDIRECT in the last 72 hours. Thyroid Function Tests: No results for input(s): TSH, T4TOTAL, FREET4, T3FREE, THYROIDAB in the last 72 hours. Anemia Panel: No results for input(s): VITAMINB12, FOLATE, FERRITIN, TIBC, IRON, RETICCTPCT in the last 72 hours. Sepsis Labs: Recent Labs  Lab 01/11/21 1254 01/11/21 1537  PROCALCITON <0.10  --   LATICACIDVEN 0.6 0.7    Recent Results (from the past 240 hour(s))  Resp Panel by RT-PCR (Flu A&B, Covid) Nasopharyngeal Swab     Status: None   Collection Time: 01/08/21 12:20 PM   Specimen: Nasopharyngeal Swab; Nasopharyngeal(NP) swabs in vial transport medium  Result Value Ref Range Status   SARS Coronavirus 2 by RT PCR NEGATIVE NEGATIVE Final    Comment: (NOTE) SARS-CoV-2 target nucleic acids are NOT DETECTED.  The SARS-CoV-2 RNA is  generally detectable in upper respiratory specimens during the acute phase of infection. The lowest concentration of SARS-CoV-2 viral copies this assay can detect is 138 copies/mL. A negative result does not preclude SARS-Cov-2 infection and should not be used as the sole basis for treatment or other patient management decisions. A negative result may occur with  improper specimen collection/handling, submission of specimen other than nasopharyngeal swab, presence of viral mutation(s) within the areas targeted by this assay, and inadequate number of viral copies(<138 copies/mL). A negative result must be combined with clinical observations, patient history, and epidemiological information. The expected result is Negative.  Fact Sheet for Patients:  EntrepreneurPulse.com.au  Fact Sheet for Healthcare Providers:  IncredibleEmployment.be  This test is no t yet approved or cleared by the [place] FDA and  has been authorized for detection and/or diagnosis of SARS-CoV-2 by FDA under an Emergency Use Authorization (EUA). This EUA will remain  in effect (meaning this test can be used) for the duration of the COVID-19 declaration under Section 564(b)(1) of the Act, 21 U.S.C.section 360bbb-3(b)(1), unless the authorization is terminated  or revoked sooner.       Influenza A by PCR NEGATIVE NEGATIVE Final   Influenza B by PCR NEGATIVE NEGATIVE Final    Comment: (NOTE) The Xpert Xpress SARS-CoV-2/FLU/RSV plus assay is intended as an aid in the diagnosis of influenza from Nasopharyngeal swab specimens and should not be used as a sole basis for treatment. Nasal washings and aspirates are unacceptable for Xpert Xpress SARS-CoV-2/FLU/RSV testing.  Fact Sheet for Patients: EntrepreneurPulse.com.au  Fact Sheet for Healthcare Providers: IncredibleEmployment.be  This test is not yet approved or cleared by the [place] FDA and has been authorized for detection and/or diagnosis of SARS-CoV-2 by FDA under an Emergency Use Authorization (EUA). This EUA will remain in effect (meaning this test can be used) for the duration of the COVID-19 declaration under Section 564(b)(1) of the Act, 21 U.S.C. section 360bbb-3(b)(1), unless the authorization is terminated or revoked.  Performed at [facility], [place] [address] [place], [place] [address]   Culture, blood (routine x 2)     Status: None (Preliminary result)   Collection Time: 01/11/21 11:22 AM   Specimen: BLOOD  Result Value Ref Range Status   Specimen Description   Final    BLOOD RIGHT ANTECUBITAL Performed at [facility], [place] [address], [place], [place] [address]    Special Requests   Final    BOTTLES DRAWN AEROBIC AND ANAEROBIC Blood Culture adequate volume Performed at [place] [address], [place], [place] [address]    Culture   Final    NO GROWTH < 24 HOURS Performed at [place] [address], [place], [place] [address]    Report Status PENDING  Incomplete  Culture, blood (routine x 2)     Status: None (Preliminary result)   Collection Time: 01/11/21 11:22 AM   Specimen: BLOOD  Result Value Ref Range Status   Specimen Description   Final    BLOOD LEFT ANTECUBITAL Performed at [facility], [place] [address], [place], [place] [address]    Special Requests   Final    BOTTLES DRAWN AEROBIC AND ANAEROBIC Blood Culture adequate volume Performed at [place] [address], [place], [place] [address]    Culture   Final    NO GROWTH < 24 HOURS Performed at [place] [address], [place],  [address]    Report Status PENDING  Incomplete    Radiology Studies: DG CHEST PORT 1 VIEW  Result Date: 01/11/2021 CLINICAL DATA:  Pneumonia. EXAM: PORTABLE CHEST 1 VIEW COMPARISON:  None. FINDINGS: The heart size and mediastinal  contours are within normal limits. Right lung is clear. Mild left basilar atelectasis or infiltrate is noted. The visualized skeletal structures are unremarkable. IMPRESSION: Mild left basilar atelectasis or infiltrate. Electronically Signed   By: Marijo Conception M.D.   On: 01/11/2021 11:54    Scheduled Meds:  apixaban  10 mg Oral BID   Followed by   Derrill Memo ON 01/18/2021] apixaban  5 mg Oral BID   ARIPiprazole  10 mg Oral Daily   busPIRone  10 mg Oral TID   carvedilol  3.125 mg Oral BID WC   folic acid  1 mg Oral Daily   lamoTRIgine  25 mg Oral Daily   multivitamin with minerals  1 tablet Oral Daily   nicotine  21 mg Transdermal Daily   sertraline  50 mg Oral Daily   sodium chloride flush  3 mL Intravenous Q12H   thiamine  100 mg Oral Daily   Or   thiamine  100 mg Intravenous Daily   Continuous Infusions:  sodium chloride 100 mL/hr at 01/12/21 0929   azithromycin 500 mg (01/11/21 1618)   cefTRIAXone (ROCEPHIN)  IV 2 g (01/11/21 1502)     LOS: 4 days    Time spent: 25 mins    Leron Stoffers, MD [facility]   If 7PM-7AM, please contact night-coverage

## 2021-01-12 NOTE — Progress Notes (Signed)
Patient granted permission for this RN to talk her her mom about her care. Mom called to express concerns that pt may be getting depressed. She shared that pt told her sister that she felt like she was going to die. That it may not be a bad idea if she did die. She report that patient that she under suicide watch in the past.  Mom reports that patient take medication for depression and  has not taken then for awhile. She also reports that the patient in under the care of a physiatrist. Will notify MD and CN. At this time pt denies suicidal  or homicidal ideations.

## 2021-01-12 NOTE — Consult Note (Signed)
[facility] Face-to-Face Psychiatry Consult   Reason for Consult: ''Severe Depression. Hx. of suicidal ideations. Please eveluate.'' Referring Physician:  Cipriano Bunker, MD Patient Identification: Kristina Owens MRN:  [id number] Principal Diagnosis: Bipolar disorder, in full remission, most recent episode depressed (HCC) Diagnosis:  Principal Problem:   Bipolar disorder, in full remission, most recent episode depressed (HCC) Active Problems:   Generalized anxiety disorder   Alcohol use disorder, severe, in early remission, dependence (HCC)   Pulmonary embolism (HCC)   DVT (deep venous thrombosis) (HCC)   Polysubstance (excluding opioids) dependence (HCC)   Alcohol abuse with withdrawal (HCC)   Total Time spent with patient: 1 hour  Subjective:   Kristina Owens is a 55 y.o. female patient admitted with left ankle pain.  HPI:  55 years old female with PMH significant for generalized anxiety disorder, Bipolar depression, GERD, polysubstance abuse(alcohol, cocaine, methamphetamine) who was admitted due to left leg pain. However, patient reports that she ran out of her antipsychotic and mood stabilizer over 2 weeks ago and has not been able to keep her follow up appointment with Dr. Zena Amos at [facility] in [place]. She reports worsening depression, anxiety but denies mood swings, psychosis, delusions and self harming thoughts.She reports that she has been self medicating with illicit drugs-alcohol, cocaine and methamphetamine. However, she is requesting to get back on her Lamictal, Buspar, Zoloft and Abilify.  Past Psychiatric History: as above  Risk to Self:  denies Risk to Others:  denies Prior Inpatient Therapy:   Prior Outpatient Therapy:  [facility]  Past Medical History:  Past Medical History:  Diagnosis Date   Anxiety    Depression    GERD (gastroesophageal reflux disease)    Ovarian cyst     Past Surgical History:  Procedure Laterality Date   APPENDECTOMY  age 3    TONSILLECTOMY Bilateral age 58   Family History:  Family History  Problem Relation Age of Onset   Asthma Mother    COPD Mother    Cancer Father        thyroid cancer   Family Psychiatric  History:  Social History:  Social History   Substance and Sexual Activity  Alcohol Use Yes     Social History   Substance and Sexual Activity  Drug Use Yes   Types: "Crack" cocaine, Cocaine, Methamphetamines   Comment: last used 4/31/22    Social History   Socioeconomic History   Marital status: Divorced    Spouse name: Not on file   Number of children: Not on file   Years of education: Not on file   Highest education level: Not on file  Occupational History   Not on file  Tobacco Use   Smoking status: Every Day    Packs/day: 0.50    Years: 35.00    Pack years: 17.50    Types: Cigarettes   Smokeless tobacco: Never  Vaping Use   Vaping Use: Never used  Substance and Sexual Activity   Alcohol use: Yes   Drug use: Yes    Types: "Crack" cocaine, Cocaine, Methamphetamines    Comment: last used 4/31/22   Sexual activity: Not on file  Other Topics Concern   Not on file  Social History Narrative   Not on file   Social Determinants of Health   Financial Resource Strain: Not on file  Food Insecurity: Not on file  Transportation Needs: Not on file  Physical Activity: Not on file  Stress: Not on file  Social Connections: Not on file  Additional Social History:    Allergies:  No Known Allergies  Labs:  Results for orders placed or performed during the hospital encounter of 01/08/21 (from the past 48 hour(s))  Basic metabolic panel     Status: Abnormal   Collection Time: 01/11/21  4:54 AM  Result Value Ref Range   Sodium 130 (L) 135 - 145 mmol/L   Potassium 4.1 3.5 - 5.1 mmol/L   Chloride 98 98 - 111 mmol/L   CO2 22 22 - 32 mmol/L   Glucose, Bld 107 (H) 70 - 99 mg/dL    Comment: Glucose reference range applies only to samples taken after fasting for at least 8 hours.    BUN 10 6 - 20 mg/dL   Creatinine, Ser 5.40 0.44 - 1.00 mg/dL   Calcium 8.2 (L) 8.9 - 10.3 mg/dL   GFR, Estimated >98 >11 mL/min    Comment: (NOTE) Calculated using the CKD-EPI Creatinine Equation (2021)    Anion gap 10 5 - 15    Comment: Performed at [facility], [address] [place], [place] [address]  CBC     Status: Abnormal   Collection Time: 01/11/21  4:54 AM  Result Value Ref Range   WBC 12.0 (H) 4.0 - 10.5 K/uL   RBC 4.71 3.87 - 5.11 MIL/uL   Hemoglobin 14.3 12.0 - 15.0 g/dL   HCT 29.5 62.1 - 30.8 %   MCV 93.6 80.0 - 100.0 fL   MCH 30.4 26.0 - 34.0 pg   MCHC 32.4 30.0 - 36.0 g/dL   RDW 65.7 (H) 84.6 - 96.2 %   Platelets 201 150 - 400 K/uL   nRBC 0.0 0.0 - 0.2 %    Comment: Performed at [facility], [address], [place], [place] [address]  Culture, blood (routine x 2)     Status: None (Preliminary result)   Collection Time: 01/11/21 11:22 AM   Specimen: BLOOD  Result Value Ref Range   Specimen Description      BLOOD RIGHT ANTECUBITAL Performed at [facility], [address] [place], [place] [address]    Special Requests      BOTTLES DRAWN AEROBIC AND ANAEROBIC Blood Culture adequate volume Performed at [facility], [address] [place], [place] [address]    Culture      NO GROWTH < 24 HOURS Performed at [facility], [address], [place], [place] [address]    Report Status PENDING   Culture, blood (routine x 2)     Status: None (Preliminary result)   Collection Time: 01/11/21 11:22 AM   Specimen: BLOOD  Result Value Ref Range   Specimen Description      BLOOD LEFT ANTECUBITAL Performed at [facility], [address], [place], [place] [address]    Special Requests      BOTTLES DRAWN AEROBIC AND ANAEROBIC Blood Culture adequate volume Performed at [facility], [address], [place], [place] [address]    Culture      NO GROWTH < 24  HOURS Performed at [facility], [address], [place], [place] [address]    Report Status PENDING   Urinalysis, Routine w reflex microscopic Urine, Catheterized     Status: Abnormal   Collection Time: 01/11/21 11:39 AM  Result Value Ref Range   Color, Urine AMBER (A) YELLOW    Comment: BIOCHEMICALS MAY BE AFFECTED BY COLOR   APPearance HAZY (A) CLEAR  Specific Gravity, Urine 1.023 1.005 - 1.030   pH 5.0 5.0 - 8.0   Glucose, UA NEGATIVE NEGATIVE mg/dL   Hgb urine dipstick MODERATE (A) NEGATIVE   Bilirubin Urine NEGATIVE NEGATIVE   Ketones, ur 20 (A) NEGATIVE mg/dL   Protein, ur NEGATIVE NEGATIVE mg/dL   Nitrite POSITIVE (A) NEGATIVE   Leukocytes,Ua NEGATIVE NEGATIVE   RBC / HPF 0-5 0 - 5 RBC/hpf   WBC, UA 0-5 0 - 5 WBC/hpf   Bacteria, UA RARE (A) NONE SEEN   Squamous Epithelial / LPF 6-10 0 - 5   Mucus PRESENT     Comment: Performed at [facility], [address] [place], [place] [address]  Lactic acid, plasma     Status: None   Collection Time: 01/11/21 12:54 PM  Result Value Ref Range   Lactic Acid, Venous 0.6 0.5 - 1.9 mmol/L    Comment: Performed at [facility], [address] [place], [place] [address]  Procalcitonin - Baseline     Status: None   Collection Time: 01/11/21 12:54 PM  Result Value Ref Range   Procalcitonin <0.10 ng/mL    Comment:        Interpretation: PCT (Procalcitonin) <= 0.5 ng/mL: Systemic infection (sepsis) is not likely. Local bacterial infection is possible. (NOTE)       Sepsis PCT Algorithm           Lower Respiratory Tract                                      Infection PCT Algorithm    ----------------------------     ----------------------------         PCT < 0.25 ng/mL                PCT < 0.10 ng/mL          Strongly encourage             Strongly discourage   discontinuation of antibiotics    initiation of antibiotics    ----------------------------     -----------------------------        PCT 0.25 - 0.50 ng/mL            PCT 0.10 - 0.25 ng/mL               OR       >80% decrease in PCT            Discourage initiation of                                            antibiotics      Encourage discontinuation           of antibiotics    ----------------------------     -----------------------------         PCT >= 0.50 ng/mL              PCT 0.26 - 0.50 ng/mL               AND        <80% decrease in PCT             Encourage initiation of  antibiotics       Encourage continuation           of antibiotics    ----------------------------     -----------------------------        PCT >= 0.50 ng/mL                  PCT > 0.50 ng/mL               AND         increase in PCT                  Strongly encourage                                      initiation of antibiotics    Strongly encourage escalation           of antibiotics                                     -----------------------------                                           PCT <= 0.25 ng/mL                                                 OR                                        > 80% decrease in PCT                                      Discontinue / Do not initiate                                             antibiotics  Performed at [facility], [address], [place], [place] [address]   Lactic acid, plasma     Status: None   Collection Time: 01/11/21  3:37 PM  Result Value Ref Range   Lactic Acid, Venous 0.7 0.5 - 1.9 mmol/L    Comment: Performed at [facility], [address] [place], [place] [address]  Basic metabolic panel     Status: Abnormal   Collection Time: 01/12/21  5:33 AM  Result Value Ref Range   Sodium 133 (L) 135 - 145 mmol/L   Potassium 4.1 3.5 - 5.1 mmol/L   Chloride 102 98 - 111 mmol/L   CO2 23 22 - 32 mmol/L   Glucose, Bld 83 70 - 99 mg/dL    Comment: Glucose reference range applies only to samples  taken after fasting for at least 8 hours.   BUN 8 6 - 20 mg/dL   Creatinine, Ser 9.16 (L) 0.44 - 1.00 mg/dL   Calcium 8.1 (  L) 8.9 - 10.3 mg/dL   GFR, Estimated >28 >41 mL/min    Comment: (NOTE) Calculated using the CKD-EPI Creatinine Equation (2021)    Anion gap 8 5 - 15    Comment: Performed at [facility], [address] [place], [place] [address]  CBC     Status: Abnormal   Collection Time: 01/12/21  5:33 AM  Result Value Ref Range   WBC 8.4 4.0 - 10.5 K/uL   RBC 4.16 3.87 - 5.11 MIL/uL   Hemoglobin 12.5 12.0 - 15.0 g/dL   HCT 10.2 72.5 - 36.6 %   MCV 93.0 80.0 - 100.0 fL   MCH 30.0 26.0 - 34.0 pg   MCHC 32.3 30.0 - 36.0 g/dL   RDW 44.0 (H) 34.7 - 42.5 %   Platelets 219 150 - 400 K/uL   nRBC 0.0 0.0 - 0.2 %    Comment: Performed at [facility], [address] [place], [place] [address]  Magnesium     Status: None   Collection Time: 01/12/21  5:33 AM  Result Value Ref Range   Magnesium 1.9 1.7 - 2.4 mg/dL    Comment: Performed at [facility], [address], [place], [place] [address]  Phosphorus     Status: Abnormal   Collection Time: 01/12/21  5:33 AM  Result Value Ref Range   Phosphorus 2.4 (L) 2.5 - 4.6 mg/dL    Comment: Performed at [facility], [address] [place], [place] [address]    Current Facility-Administered Medications  Medication Dose Route Frequency Provider Last Rate Last Admin   0.9 %  sodium chloride infusion   Intravenous Continuous Cipriano Bunker, MD 100 mL/hr at 01/12/21 0929 New Bag at 01/12/21 0929   acetaminophen (TYLENOL) tablet 650 mg  650 mg Oral Q6H PRN Jae Dire, MD   650 mg at 01/11/21 1130   Or   acetaminophen (TYLENOL) suppository 650 mg  650 mg Rectal Q6H PRN Jae Dire, MD       apixaban Everlene Balls) tablet 10 mg  10 mg Oral BID Cipriano Bunker, MD   10 mg at 01/12/21 0932   Followed by   Melene Muller ON 01/18/2021] apixaban (ELIQUIS) tablet 5 mg  5 mg  Oral BID Cipriano Bunker, MD       ARIPiprazole (ABILIFY) tablet 10 mg  10 mg Oral Daily Cipriano Bunker, MD   10 mg at 01/12/21 0931   azithromycin (ZITHROMAX) 500 mg in sodium chloride 0.9 % 250 mL IVPB  500 mg Intravenous Q24H Cipriano Bunker, MD 250 mL/hr at 01/11/21 1618 500 mg at 01/11/21 1618   busPIRone (BUSPAR) tablet 10 mg  10 mg Oral TID Cipriano Bunker, MD   10 mg at 01/12/21 0931   carvedilol (COREG) tablet 3.125 mg  3.125 mg Oral BID WC Cipriano Bunker, MD   3.125 mg at 01/12/21 0933   cefTRIAXone (ROCEPHIN) 2 g in sodium chloride 0.9 % 100 mL IVPB  2 g Intravenous Q24H Cipriano Bunker, MD 200 mL/hr at 01/11/21 1502 2 g at 01/11/21 1502   folic acid (FOLVITE) tablet 1 mg  1 mg Oral Daily Jae Dire, MD   1 mg at 01/12/21 0931   HYDROcodone-acetaminophen (NORCO/VICODIN) 5-325 MG per tablet 1 tablet  1 tablet Oral Q4H PRN Jae Dire, MD   1 tablet at 01/12/21 0920   lamoTRIgine (LAMICTAL) tablet 25 mg  25 mg Oral Daily Cipriano Bunker, MD   25  mg at 01/12/21 0932   morphine 2 MG/ML injection 2 mg  2 mg Intravenous Q2H PRN Jae Dire, MD   2 mg at 01/12/21 0544   multivitamin with minerals tablet 1 tablet  1 tablet Oral Daily Jae Dire, MD   1 tablet at 01/12/21 0931   nicotine (NICODERM CQ - dosed in mg/24 hours) patch 21 mg  21 mg Transdermal Daily Zierle-Ghosh, Asia B, DO   21 mg at 01/12/21 0935   polyethylene glycol (MIRALAX / GLYCOLAX) packet 17 g  17 g Oral Daily PRN Jae Dire, MD       sertraline (ZOLOFT) tablet 50 mg  50 mg Oral Daily Cipriano Bunker, MD   50 mg at 01/12/21 0931   sodium chloride flush (NS) 0.9 % injection 3 mL  3 mL Intravenous Q12H Jae Dire, MD   3 mL at 01/11/21 1215   thiamine tablet 100 mg  100 mg Oral Daily Jae Dire, MD   100 mg at 01/12/21 0930   Or   thiamine (B-1) injection 100 mg  100 mg Intravenous Daily Jae Dire, MD        Musculoskeletal: Strength & Muscle Tone:  unable to assess Gait & Station: unable to  stand Patient leans: N/A    Psychiatric Specialty Exam:  Presentation  General Appearance: Appropriate for Environment  Eye Contact:Good  Speech:Clear and Coherent  Speech Volume:Normal  Handedness:Right   Mood and Affect  Mood:Depressed  Affect:Constricted   Thought Process  Thought Processes:Coherent; Linear  Descriptions of Associations:Intact  Orientation:Full (Time, Place and Person)  Thought Content:Logical  History of Schizophrenia/Schizoaffective disorder:No data recorded Duration of Psychotic Symptoms:No data recorded Hallucinations:Hallucinations: None  Ideas of Reference:None  Suicidal Thoughts:Suicidal Thoughts: No  Homicidal Thoughts:Homicidal Thoughts: No   Sensorium  Memory:Immediate Good; Recent Good; Remote Good  Judgment:Intact  Insight:Shallow   Executive Functions  Concentration:Good  Attention Span:Good  Recall:Good  Fund of Knowledge:Good  Language:Good   Psychomotor Activity  Psychomotor Activity:Psychomotor Activity: Psychomotor Retardation   Assets  Assets:Communication Skills; Desire for Improvement; Social Support   Sleep  Sleep:Sleep: Fair   Physical Exam: Physical Exam Review of Systems  Constitutional:  Positive for malaise/fatigue.  HENT: Negative.    Eyes: Negative.   Respiratory: Negative.    Cardiovascular: Negative.   Gastrointestinal: Negative.   Genitourinary: Negative.   Musculoskeletal:  Positive for myalgias.  Skin: Negative.   Psychiatric/Behavioral:  Positive for depression and substance abuse.   Blood pressure 101/66, pulse 95, temperature (!) 100.9 F (38.3 C), temperature source Oral, resp. rate 15, height  (1.6 m), weight 68 kg, SpO2 97 %. Body mass index is 26.57 kg/m.  Treatment Plan Summary: 55 year old female with history of mental illness who ran out of her medications over 2 weeks ago but has been self medicating with illicit drugs. However, patient is requesting to  be back on her medications.  Recommendations: -Continue Lamictal 25 mg daily, Buspar 10 mg tid, Abilify 10 mg daily and Zoloft 50 mg daily for depression/mood/anxiety -Social worker to refer patient back to  Dr. Zena Amos at [facility] in [place] for outpatient psychiatric care upon discharge.    Disposition: No evidence of imminent risk to self or others at present.   Patient does not meet criteria for psychiatric inpatient admission. Supportive therapy provided about ongoing stressors. Psychiatric service signing out. Re-consult as needed  Thedore Mins, MD 01/12/2021 1:07 PM

## 2021-01-13 DIAGNOSIS — F3176 Bipolar disorder, in full remission, most recent episode depressed: Secondary | ICD-10-CM

## 2021-01-13 LAB — BASIC METABOLIC PANEL
Anion gap: 7 (ref 5–15)
BUN: 6 mg/dL (ref 6–20)
CO2: 23 mmol/L (ref 22–32)
Calcium: 7.8 mg/dL — ABNORMAL LOW (ref 8.9–10.3)
Chloride: 105 mmol/L (ref 98–111)
Creatinine, Ser: 0.42 mg/dL — ABNORMAL LOW (ref 0.44–1.00)
GFR, Estimated: >60 mL/min (ref >60)
Glucose, Bld: 83 mg/dL (ref 70–99)
Potassium: 3.4 mmol/L — ABNORMAL LOW (ref 3.5–5.1)
Sodium: 135 mmol/L (ref 135–145)

## 2021-01-13 LAB — CBC
HCT: 36.3 % (ref 36.0–46.0)
Hemoglobin: 11.7 g/dL — ABNORMAL LOW (ref 12.0–15.0)
MCH: 30.4 pg (ref 26.0–34.0)
MCHC: 32.2 g/dL (ref 30.0–36.0)
MCV: 94.3 fL (ref 80.0–100.0)
Platelets: 257 10*3/uL (ref 150–400)
RBC: 3.85 MIL/uL — ABNORMAL LOW (ref 3.87–5.11)
RDW: 15.6 % — ABNORMAL HIGH (ref 11.5–15.5)
WBC: 6.1 10*3/uL (ref 4.0–10.5)
nRBC: 0 % (ref 0.0–0.2)

## 2021-01-13 MED ORDER — SODIUM CHLORIDE 0.9 % IV BOLUS
500.0000 mL | Freq: Once | INTRAVENOUS | Status: AC
  Administered 2021-01-13: 500 mL via INTRAVENOUS

## 2021-01-13 MED ORDER — POTASSIUM CHLORIDE 20 MEQ PO PACK
40.0000 meq | PACK | Freq: Once | ORAL | Status: AC
  Administered 2021-01-13: 40 meq via ORAL
  Filled 2021-01-13: qty 2

## 2021-01-13 MED ORDER — AZITHROMYCIN 250 MG PO TABS
500.0000 mg | ORAL_TABLET | Freq: Every day | ORAL | Status: DC
  Administered 2021-01-13 – 2021-01-14 (×2): 500 mg via ORAL
  Filled 2021-01-13 (×2): qty 2

## 2021-01-13 NOTE — Progress Notes (Signed)
PROGRESS NOTE    Kristina Owens  SFS:[id number] DOB: 1965/10/10 DOA: 01/08/2021 PCP: Haywood Filler., FNP   Brief Narrative: This 55 years old female with PMH significant for anxiety, depression, GERD, bipolar, polysubstance abuse, alcohol abuse presented in the ED with 2 weeks history of left leg pain.  Patient was seen in the ED on 5/31 for alcohol withdrawal and at the time she was noted to have left ankle pain.  she had a D-dimer 1.3 and was advised to get an outpatient ultrasound but this did not happen.  Patient most recently used crack cocaine and methamphetamine and alcohol one day prior to admission.  She denies any IV drug use.  She does report history of tremors and night sweats  from withdrawal.  She denies any history of cancer, VTE, OCP use, recent surgery, recent travel but does smoke cigarettes regularly.  Bilateral lower extremity Duplex: positive for left lower extremity acute DVT involving left common femoral, left femoral, popliteal, posterior tibial, peroneal, soleal and gastrocnemius veins.  CTA chest shows multiple bilateral PE including saddle embolus with CT evidence of right heart strain.  Patient was admitted for DVT and acute PE and started on Lovenox. She was successfully transitioned on Eliquis.  Patient has spiked fever, UA consistent with UTI , chest x-ray shows infiltrate and started on empiric antibiotics, following of blood and urine cultures.  Assessment & Plan:   Principal Problem:   Bipolar disorder, in full remission, most recent episode depressed ([place]) Active Problems:   Generalized anxiety disorder   Alcohol use disorder, severe, in early remission, dependence ([place])   Pulmonary embolism ([place])   DVT (deep venous thrombosis) ([facility])   Polysubstance (excluding opioids) dependence ([place])   Alcohol abuse with withdrawal ([place])   Acute unprovoked left LE DVT /Acute saddle PE with right heart strain. Patient was hemodynamically stable. Bilateral LE  Duplex : Positive L LE acute DVT involving left common femoral, left femoral, popliteal, posterior tibial, peroneal, soleal and gastrocnemius veins. CTA chest shows multiple bilateral PE including saddle embolus with right heart strain. Started on therapeutic Lovenox. IR consulted: Patient is not a candidate for thrombectomy,  reconsult IR if patient is unstable. Lovenox successfully transitioned to Eliquis.  Sepsis secondary to UTI/pneumonia. Patient has spiked fever 101.4, 100.9 met sepsis criteria with leukocytosis, tachycardia, tachypnea , fever, focus UTI. UA consistent with UTI and chest x-ray shows infiltrate. Continue ceftriaxone and Zithromax for CAP and UTI Blood cultures no growth so far, urine cultures pending,   Lactic acid normal, procalcitonin level 0.10  Polysubstance abuse: Patient reported recent use of alcohol, crack cocaine and methamphetamine 1 day prior to hospitalization Patient is interested to go into rehab and wishes to detox. Transition of care consulted.  Alcohol abuse and suspected alcohol withdrawal.: Continue CIWA protocol  Anxiety disorder, bipolar, depression Patient seems very depressed. Psych consulted. Patient resumed on her home medications, Lamictal, BuSpar, Zoloft, Abilify  Tobacco use: Continue nicotine patch, advised smoking cessation  DVT prophylaxis: Eliquis Code Status: Full code. Family Communication: No family at bedside. Disposition Plan:  Status is: Inpatient  Remains inpatient appropriate because:Inpatient level of care appropriate due to severity of illness   Dispo: The patient is from: Home              Anticipated d/c is to: Home in 1-2 days.              Patient currently is not medically stable to d/c.   Difficult to place patient  No   Consultants:   None  Procedures: Venous duplex, CTA chest. Antimicrobials:  Anti-infectives (From admission, onward)    Start     Dose/Rate Route Frequency Ordered Stop    01/11/21 1400  azithromycin (ZITHROMAX) 500 mg in sodium chloride 0.9 % 250 mL IVPB        500 mg 250 mL/hr over 60 Minutes Intravenous Every 24 hours 01/11/21 1254     01/11/21 1300  cefTRIAXone (ROCEPHIN) 2 g in sodium chloride 0.9 % 100 mL IVPB        2 g 200 mL/hr over 30 Minutes Intravenous Every 24 hours 01/11/21 1254         Subjective: Patient was seen and examined at bedside.  Overnight events noted.   She remained afebrile in last 24 hours. She reports chronic back pain, left leg pain, hurts when she walks. She appears more alert and active, she feels better after her psych medications were resumed. She was encouraged to ambulate.   Objective: Vitals:   01/12/21 1749 01/12/21 2011 01/13/21 0600 01/13/21 0853  BP: 100/68  (!) 92/55 (!) 91/59  Pulse: 87  75 70  Resp:  20 20   Temp:  98.1 F (36.7 C) 98.5 F (36.9 C)   TempSrc:  Oral Oral   SpO2:   94%   Weight:      Height:        Intake/Output Summary (Last 24 hours) at 01/13/2021 1058 Last data filed at 01/13/2021 0800 Gross per 24 hour  Intake 990 ml  Output 200 ml  Net 790 ml    Filed Weights   01/08/21 1207  Weight: 68 kg    Examination:  General exam: Appears calm and comfortable , more active and alert. Respiratory system: Clear to auscultation. Respiratory effort normal. Cardiovascular system: S1 & S2 heard, RRR. No JVD, murmurs, rubs, gallops or clicks. No pedal edema. Gastrointestinal system: Abdomen is nondistended, soft and nontender. No organomegaly or masses felt. Normal bowel sounds heard. Central nervous system: Alert and oriented X3. No focal neurological deficits. Extremities: No edema, no cyanosis, no clubbing,  Left leg calf tenderness noted. Skin: No rashes, lesions or ulcers Psychiatry: Judgement and insight appear normal. Mood & affect appropriate.  She denies suicidal/ homicidal ideations.    Data Reviewed: I have personally reviewed following labs and imaging  studies  CBC: Recent Labs  Lab 01/08/21 1220 01/09/21 0518 01/10/21 0513 01/11/21 0454 01/12/21 0533 01/13/21 0543  WBC 7.9 7.6 8.6 12.0* 8.4 6.1  NEUTROABS 4.6  --   --   --   --   --   HGB 15.3* 14.4 15.0 14.3 12.5 11.7*  HCT 48.1* 45.7 47.6* 44.1 38.7 36.3  MCV 94.3 96.2 95.6 93.6 93.0 94.3  PLT 170 141* 162 201 219 088   Basic Metabolic Panel: Recent Labs  Lab 01/08/21 1740 01/09/21 0518 01/10/21 0513 01/11/21 0454 01/12/21 0533 01/13/21 0543  NA  --  139 134* 130* 133* 135  K  --  3.1* 4.4 4.1 4.1 3.4*  CL  --  102 103 98 102 105  CO2  --  31 26 22 23 23   GLUCOSE  --  107* 103* 107* 83 83  BUN  --  8 8 10 8 6   CREATININE  --  0.76 0.72 0.61 0.39* 0.42*  CALCIUM  --  8.2* 8.2* 8.2* 8.1* 7.8*  MG 2.0  --   --   --  1.9  --   PHOS  --   --   --   --  2.4*  --    GFR: Estimated Creatinine Clearance: 75.2 mL/min (A) (by C-G formula based on SCr of 0.42 mg/dL (L)). Liver Function Tests: No results for input(s): AST, ALT, ALKPHOS, BILITOT, PROT, ALBUMIN in the last 168 hours. No results for input(s): LIPASE, AMYLASE in the last 168 hours. No results for input(s): AMMONIA in the last 168 hours. Coagulation Profile: Recent Labs  Lab 01/08/21 0915  INR 1.1   Cardiac Enzymes: No results for input(s): CKTOTAL, CKMB, CKMBINDEX, TROPONINI in the last 168 hours. BNP (last 3 results) No results for input(s): PROBNP in the last 8760 hours. HbA1C: No results for input(s): HGBA1C in the last 72 hours. CBG: No results for input(s): GLUCAP in the last 168 hours. Lipid Profile: No results for input(s): CHOL, HDL, LDLCALC, TRIG, CHOLHDL, LDLDIRECT in the last 72 hours. Thyroid Function Tests: No results for input(s): TSH, T4TOTAL, FREET4, T3FREE, THYROIDAB in the last 72 hours. Anemia Panel: No results for input(s): VITAMINB12, FOLATE, FERRITIN, TIBC, IRON, RETICCTPCT in the last 72 hours. Sepsis Labs: Recent Labs  Lab 01/11/21 1254 01/11/21 1537  PROCALCITON <0.10   --   LATICACIDVEN 0.6 0.7    Recent Results (from the past 240 hour(s))  Resp Panel by RT-PCR (Flu A&B, Covid) Nasopharyngeal Swab     Status: None   Collection Time: 01/08/21 12:20 PM   Specimen: Nasopharyngeal Swab; Nasopharyngeal(NP) swabs in vial transport medium  Result Value Ref Range Status   SARS Coronavirus 2 by RT PCR NEGATIVE NEGATIVE Final    Comment: (NOTE) SARS-CoV-2 target nucleic acids are NOT DETECTED.  The SARS-CoV-2 RNA is generally detectable in upper respiratory specimens during the acute phase of infection. The lowest concentration of SARS-CoV-2 viral copies this assay can detect is 138 copies/mL. A negative result does not preclude SARS-Cov-2 infection and should not be used as the sole basis for treatment or other patient management decisions. A negative result may occur with  improper specimen collection/handling, submission of specimen other than nasopharyngeal swab, presence of viral mutation(s) within the areas targeted by this assay, and inadequate number of viral copies(<138 copies/mL). A negative result must be combined with clinical observations, patient history, and epidemiological information. The expected result is Negative.  Fact Sheet for Patients:  EntrepreneurPulse.com.au  Fact Sheet for Healthcare Providers:  IncredibleEmployment.be  This test is no t yet approved or cleared by the [place] FDA and  has been authorized for detection and/or diagnosis of SARS-CoV-2 by FDA under an Emergency Use Authorization (EUA). This EUA will remain  in effect (meaning this test can be used) for the duration of the COVID-19 declaration under Section 564(b)(1) of the Act, 21 U.S.C.section 360bbb-3(b)(1), unless the authorization is terminated  or revoked sooner.       Influenza A by PCR NEGATIVE NEGATIVE Final   Influenza B by PCR NEGATIVE NEGATIVE Final    Comment: (NOTE) The Xpert Xpress SARS-CoV-2/FLU/RSV  plus assay is intended as an aid in the diagnosis of influenza from Nasopharyngeal swab specimens and should not be used as a sole basis for treatment. Nasal washings and aspirates are unacceptable for Xpert Xpress SARS-CoV-2/FLU/RSV testing.  Fact Sheet for Patients: EntrepreneurPulse.com.au  Fact Sheet for Healthcare Providers: IncredibleEmployment.be  This test is not yet approved or cleared by the [place] FDA and has been authorized for detection and/or diagnosis of SARS-CoV-2 by FDA under an Emergency Use Authorization (EUA). This EUA will remain in effect (meaning this test can be used) for the duration of the COVID-19  declaration under Section 564(b)(1) of the Act, 21 U.S.C. section 360bbb-3(b)(1), unless the authorization is terminated or revoked.  Performed at [facility], [place] [address], [place], [place] [address]   Culture, blood (routine x 2)     Status: None (Preliminary result)   Collection Time: 01/11/21 11:22 AM   Specimen: BLOOD  Result Value Ref Range Status   Specimen Description   Final    BLOOD RIGHT ANTECUBITAL Performed at [facility], [place] [address], [place], [place] [address]    Special Requests   Final    BOTTLES DRAWN AEROBIC AND ANAEROBIC Blood Culture adequate volume Performed at [place] [address] [place], [place] [address]    Culture   Final    NO GROWTH 2 DAYS Performed at [place] [address], [place], [place] [address]    Report Status PENDING  Incomplete  Culture, blood (routine x 2)     Status: None (Preliminary result)   Collection Time: 01/11/21 11:22 AM   Specimen: BLOOD  Result Value Ref Range Status   Specimen Description   Final    BLOOD LEFT ANTECUBITAL Performed at [facility], [place] [address], [place], [place] [address]    Special Requests   Final    BOTTLES DRAWN AEROBIC AND ANAEROBIC Blood Culture adequate  volume Performed at [place] [address], [place], [place] [address]    Culture   Final    NO GROWTH 2 DAYS Performed at [place] [address], [place], [place] [address]    Report Status PENDING  Incomplete    Radiology Studies: No results found.  Scheduled Meds:  apixaban  10 mg Oral BID   Followed by   Derrill Memo ON 01/18/2021] apixaban  5 mg Oral BID   ARIPiprazole  10 mg Oral Daily   busPIRone  10 mg Oral TID   carvedilol  3.125 mg Oral BID WC   folic acid  1 mg Oral Daily   lamoTRIgine  25 mg Oral Daily   multivitamin with minerals  1 tablet Oral Daily   nicotine  21 mg Transdermal Daily   sertraline  50 mg Oral Daily   sodium chloride flush  3 mL Intravenous Q12H   thiamine  100 mg Oral Daily   Or   thiamine  100 mg Intravenous Daily   Continuous Infusions:  sodium chloride 100 mL/hr at 01/13/21 0839   azithromycin 500 mg (01/12/21 1411)   cefTRIAXone (ROCEPHIN)  IV 2 g (01/12/21 1324)     LOS: 5 days    Time spent: 25 mins    Laryn Venning, MD [facility]   If 7PM-7AM, please contact night-coverage

## 2021-01-14 ENCOUNTER — Inpatient Hospital Stay (HOSPITAL_COMMUNITY): Payer: Self-pay

## 2021-01-14 LAB — URINE CULTURE: Culture: >=100000 — AB

## 2021-01-14 LAB — POTASSIUM: Potassium: 3.5 mmol/L (ref 3.5–5.1)

## 2021-01-14 MED ORDER — SODIUM CHLORIDE 0.9 % IV BOLUS
500.0000 mL | Freq: Once | INTRAVENOUS | Status: AC
  Administered 2021-01-14: 500 mL via INTRAVENOUS

## 2021-01-14 MED ORDER — HYDROCODONE-ACETAMINOPHEN 5-325 MG PO TABS
1.0000 | ORAL_TABLET | Freq: Once | ORAL | Status: AC
  Administered 2021-01-14: 1 via ORAL

## 2021-01-14 NOTE — Progress Notes (Signed)
Patient gave permission to speak with her mother Dewayne Hatch regarding continued plan of care.

## 2021-01-14 NOTE — Progress Notes (Addendum)
Physical Therapy Treatment Patient Details Name: Kristina Owens MRN: [id number] DOB: [birth year] Today's Date: 01/14/2021    History of Present Illness 55 year old female who presents with L leg pain for 2 weeks; also SOB without chest pain, chronic cough. Bilateral LE Doppler - Positive LLE acute DVT involving the left common femoral, left femoral, popliteal, posterior tibial, peroneal, soleal and gastrocnemius veins. CTA chest - multiple bilateral PE including saddle embolus with CT evidence of right heart strain. PMH: anxiety, depression, GERD, bipolar, polysubstance abuse, alcohol abuse    PT Comments    Pt ambulates 15 ft with IV pole to steady, supv for safety, limited by LLE pain complaints. Verbal cues for IV pole maneuvering, LLE weight-bearing and attention to task. Pt without dyspnea or chest pain complaints during session, 98% on RA. Pt tolerates remaining up in recliner at EOS with reports of improvement in LLE pain. Pt appears distracted about d/c tomorrow; RN aware pt wanting to speak with case manager and reaching out. Will continue acute PT as able.   Follow Up Recommendations  No PT follow up     Equipment Recommendations  None recommended by PT    Recommendations for Other Services       Precautions / Restrictions Precautions Precautions: Fall Restrictions Weight Bearing Restrictions: No    Mobility  Bed Mobility Overal bed mobility: Modified Independent    General bed mobility comments: increased time, no assist or cues    Transfers Overall transfer level: Needs assistance Equipment used: None   Sit to Stand: Supervision  General transfer comment: supv for safety  Ambulation/Gait Ambulation/Gait assistance: Supervision Gait Distance (Feet): 15 Feet Assistive device: IV Pole Gait Pattern/deviations: Step-to pattern;Decreased stride length;Decreased weight shift to left Gait velocity: decreased   General Gait Details: pt with LLE slightly  externally rotated and decreased L weight-shift due to pain complaints in WB, pushing IV pole for steadying, no LOB, limited by LLE pain   Stairs             Wheelchair Mobility    Modified Rankin (Stroke Patients Only)       Balance Overall balance assessment: Mild deficits observed, not formally tested       Cognition Arousal/Alertness: Awake/alert Behavior During Therapy: WFL for tasks assessed/performed Overall Cognitive Status: Within Functional Limits for tasks assessed  General Comments: Pt perseverating on d/c tomorrow, asks therapist and nurse multiple times during session to speak with case manager to set up d/c location- RN contacting Sports coach.      Exercises      General Comments General comments (skin integrity, edema, etc.): Pt on RA with SpO2 98%.      Pertinent Vitals/Pain Pain Assessment: Faces Faces Pain Scale: Hurts even more Pain Location: L thigh with ambulation Pain Descriptors / Indicators: Aching;Tightness Pain Intervention(s): Limited activity within patient's tolerance;Monitored during session;Repositioned    Home Living                      Prior Function            PT Goals (current goals can now be found in the care plan section) Acute Rehab PT Goals Patient Stated Goal: less pain PT Goal Formulation: With patient Time For Goal Achievement: 01/24/21 Potential to Achieve Goals: Good Progress towards PT goals: Progressing toward goals    Frequency    Min 3X/week      PT Plan Current plan remains appropriate    Co-evaluation  AM-PAC PT "6 Clicks" Mobility   Outcome Measure  Help needed turning from your back to your side while in a flat bed without using bedrails?: None Help needed moving from lying on your back to sitting on the side of a flat bed without using bedrails?: None Help needed moving to and from a bed to a chair (including a wheelchair)?: A Little Help needed standing up  from a chair using your arms (e.g., wheelchair or bedside chair)?: A Little Help needed to walk in hospital room?: A Little Help needed climbing 3-5 steps with a railing? : A Little 6 Click Score: 20    End of Session   Activity Tolerance: Patient limited by pain Patient left: in chair;with call bell/phone within reach;with chair alarm set Nurse Communication: Mobility status;Other (comment) (d/c planning) PT Visit Diagnosis: Other abnormalities of gait and mobility (R26.89)     Time: 2409-7353 PT Time Calculation (min) (ACUTE ONLY): 10 min  Charges:  $Gait Training: 8-22 mins                      Tori Donesha Wallander PT, DPT 01/14/21, 3:54 PM

## 2021-01-14 NOTE — Progress Notes (Addendum)
Patient complains of worsening pain in LLE. Pt states that edema is worse than yesterday. On am assessment it was noted LLE was greater than right. No change from am assessment. Edema LLE ankle up to thigh area. BLE appear to be red/tan. Warm,2+ pedal pulse BLE. Provider updated and contacted to assist. V.O received to administer  Norco/Vicodin5-325 mg once now.

## 2021-01-14 NOTE — TOC Progression Note (Signed)
Transition of Care [facility]) - Progression Note    Patient Details  Name: Kristina Owens MRN: [id number] Date of Birth: [birth year]  [facility] CM/SW Contact  Asaiah Scarber, Olegario Messier, RN Phone Number: 01/14/2021, 4:31 PM  Clinical Narrative:  Spoke to patient again about resources for otpt SA, & psych resources-listed in f/u d/c section-patient found her list given to her on an earlier date,along with other resources. Patient can go to [facility] @ d/c since she states she is homeless, & would rather live on the streets. She must d/c by 12p to get to [facility] timely.     Expected Discharge Plan: Homeless Shelter Barriers to Discharge: Continued Medical Work up  Expected Discharge Plan and Services Expected Discharge Plan: Homeless Shelter   Discharge Planning Services: CM Consult   Living arrangements for the past 2 months: Apartment                                       Social Determinants of Health (SDOH) Interventions    Readmission Risk Interventions No flowsheet data found.

## 2021-01-14 NOTE — Progress Notes (Signed)
PROGRESS NOTE    Kristina Owens  JOA:[id number] DOB: July 06, 1966 DOA: 01/08/2021 PCP: Haywood Filler., FNP   Brief Narrative: This 55 years old female with PMH significant for anxiety, depression, GERD, bipolar, polysubstance abuse, alcohol abuse presented in the ED with 2 weeks history of left leg pain. Patient most recently used crack cocaine and methamphetamine and alcohol one day prior to admission.  She denies any IV drug use.  She had D-dimer 1.3 and  Bilateral lower extremity Duplex: positive for acute DVT involving left common femoral, left femoral, popliteal, posterior tibial, peroneal, soleal and gastrocnemius veins.  CTA chest shows multiple bilateral PE including saddle embolus with CT evidence of right heart strain.  Patient was admitted for DVT and acute PE and started on Lovenox. She was successfully transitioned on Eliquis.  Patient had spiked fever, UA consistent with UTI, started on antibiotics.   Assessment & Plan:   Principal Problem:   Bipolar disorder, in full remission, most recent episode depressed ([place]) Active Problems:   Generalized anxiety disorder   Alcohol use disorder, severe, in early remission, dependence ([place])   Pulmonary embolism ([place])   DVT (deep venous thrombosis) ([facility])   Polysubstance (excluding opioids) dependence ([place])   Alcohol abuse with withdrawal ([place])   Acute unprovoked left LE DVT /Acute saddle PE with right heart strain. Patient was hemodynamically stable. Bilateral LE Duplex : Positive L LE acute DVT involving left common femoral, left femoral, popliteal, posterior tibial, peroneal, soleal and gastrocnemius veins. CTA chest shows multiple bilateral PE including saddle embolus with right heart strain. Started on therapeutic Lovenox. IR consulted: Patient is not a candidate for thrombectomy,  reconsult IR if patient is unstable. Lovenox successfully transitioned to Eliquis.  Sepsis secondary to UTI/pneumonia. Patient had spiked  fever 101.4, 100.9 met sepsis criteria with leukocytosis, tachycardia, tachypnea , fever, focus UTI. UA consistent with UTI and chest x-ray shows infiltrate/ atelectasis. Continue ceftriaxone and Zithromax for CAP and UTI Blood cultures no growth so far, urine cultures E. coli sensitive to ceftriaxone. Lactic acid normal, procalcitonin level 0.10. Discontinue Zithromax.  Change to Keflex at discharge for few days.  Polysubstance abuse: Patient reported recent use of alcohol, crack cocaine and methamphetamine 1 day prior to hospitalization Patient is interested to go into rehab and wishes to detox. Transition of care consulted.  Alcohol abuse and suspected alcohol withdrawal.: Continue CIWA protocol  Anxiety disorder, bipolar, depression Patient seems very depressed. Psych consulted. Patient resumed on her home medications, Lamictal, BuSpar, Zoloft, Abilify  Tobacco use: Continue nicotine patch, advised smoking cessation  DVT prophylaxis: Eliquis Code Status: Full code. Family Communication: No family at bedside. Disposition Plan:  Status is: Inpatient  Remains inpatient appropriate because:Inpatient level of care appropriate due to severity of illness   Dispo: The patient is from: Home              Anticipated d/c is to: Home in 1-2 days.              Patient currently is not medically stable to d/c.   Difficult to place patient No  Patient is interested in detox.  Consultants:   None  Procedures: Venous duplex, CTA chest. Antimicrobials:  Anti-infectives (From admission, onward)    Start     Dose/Rate Route Frequency Ordered Stop   01/13/21 1400  azithromycin (ZITHROMAX) tablet 500 mg        500 mg Oral Daily 01/13/21 1314     01/11/21 1400  azithromycin (ZITHROMAX) 500 mg  in sodium chloride 0.9 % 250 mL IVPB  Status:  Discontinued        500 mg 250 mL/hr over 60 Minutes Intravenous Every 24 hours 01/11/21 1254 01/13/21 1314   01/11/21 1300  cefTRIAXone  (ROCEPHIN) 2 g in sodium chloride 0.9 % 100 mL IVPB        2 g 200 mL/hr over 30 Minutes Intravenous Every 24 hours 01/11/21 1254         Subjective: Patient was seen and examined at bedside.  Overnight events noted.   She remained afebrile in last 48 hours. She reports chronic back pain, left leg pain, hurts when she walks. She appears more alert and active, she feels better after her psych medications were resumed. She was encouraged to ambulate. She is interested in detox.   Objective: Vitals:   01/13/21 1500 01/13/21 1700 01/13/21 2059 01/14/21 0515  BP: (!) 91/58 114/71 96/72 (!) 99/58  Pulse: 78 75 68 75  Resp:   18 16  Temp:   98 F (36.7 C) 98.1 F (36.7 C)  TempSrc:   Oral Oral  SpO2: 94% 96% 96% 92%  Weight:      Height:        Intake/Output Summary (Last 24 hours) at 01/14/2021 1044 Last data filed at 01/13/2021 2200 Gross per 24 hour  Intake 120 ml  Output 201 ml  Net -81 ml    Filed Weights   01/08/21 1207  Weight: 68 kg    Examination:  General exam: Appears calm and comfortable , more active and alert. Respiratory system: Clear to auscultation. Respiratory effort normal. Cardiovascular system: S1 & S2 heard, RRR. No JVD, murmurs, rubs, gallops or clicks. No pedal edema. Gastrointestinal system: Abdomen is nondistended, soft and nontender. No organomegaly or masses felt. Normal bowel sounds heard. Central nervous system: Alert and oriented X3. No focal neurological deficits. Extremities: No edema, no cyanosis, no clubbing,  Left leg calf tenderness noted. Skin: No rashes, lesions or ulcers Psychiatry: Judgement and insight appear normal. Mood & affect appropriate.  She denies suicidal/ homicidal ideations.    Data Reviewed: I have personally reviewed following labs and imaging studies  CBC: Recent Labs  Lab 01/08/21 1220 01/09/21 0518 01/10/21 0513 01/11/21 0454 01/12/21 0533 01/13/21 0543  WBC 7.9 7.6 8.6 12.0* 8.4 6.1  NEUTROABS 4.6   --   --   --   --   --   HGB 15.3* 14.4 15.0 14.3 12.5 11.7*  HCT 48.1* 45.7 47.6* 44.1 38.7 36.3  MCV 94.3 96.2 95.6 93.6 93.0 94.3  PLT 170 141* 162 201 219 675   Basic Metabolic Panel: Recent Labs  Lab 01/08/21 1740 01/09/21 0518 01/10/21 0513 01/11/21 0454 01/12/21 0533 01/13/21 0543 01/14/21 0458  NA  --  139 134* 130* 133* 135  --   K  --  3.1* 4.4 4.1 4.1 3.4* 3.5  CL  --  102 103 98 102 105  --   CO2  --  _0 --   GLUCOSE  --  107* 103* 107* 83 83  --   BUN  --  _1 --   CREATININE  --  0.76 0.72 0.61 0.39* 0.42*  --   CALCIUM  --  8.2* 8.2* 8.2* 8.1* 7.8*  --   MG 2.0  --   --   --  1.9  --   --   PHOS  --   --   --   --  2.4*  --   --    GFR: Estimated Creatinine Clearance: 75.2 mL/min (A) (by C-G formula based on SCr of 0.42 mg/dL (L)). Liver Function Tests: No results for input(s): AST, ALT, ALKPHOS, BILITOT, PROT, ALBUMIN in the last 168 hours. No results for input(s): LIPASE, AMYLASE in the last 168 hours. No results for input(s): AMMONIA in the last 168 hours. Coagulation Profile: Recent Labs  Lab 01/08/21 0915  INR 1.1   Cardiac Enzymes: No results for input(s): CKTOTAL, CKMB, CKMBINDEX, TROPONINI in the last 168 hours. BNP (last 3 results) No results for input(s): PROBNP in the last 8760 hours. HbA1C: No results for input(s): HGBA1C in the last 72 hours. CBG: No results for input(s): GLUCAP in the last 168 hours. Lipid Profile: No results for input(s): CHOL, HDL, LDLCALC, TRIG, CHOLHDL, LDLDIRECT in the last 72 hours. Thyroid Function Tests: No results for input(s): TSH, T4TOTAL, FREET4, T3FREE, THYROIDAB in the last 72 hours. Anemia Panel: No results for input(s): VITAMINB12, FOLATE, FERRITIN, TIBC, IRON, RETICCTPCT in the last 72 hours. Sepsis Labs: Recent Labs  Lab 01/11/21 1254 01/11/21 1537  PROCALCITON <0.10  --   LATICACIDVEN 0.6 0.7    Recent Results (from the past 240 hour(s))  Resp Panel by RT-PCR (Flu A&B,  Covid) Nasopharyngeal Swab     Status: None   Collection Time: 01/08/21 12:20 PM   Specimen: Nasopharyngeal Swab; Nasopharyngeal(NP) swabs in vial transport medium  Result Value Ref Range Status   SARS Coronavirus 2 by RT PCR NEGATIVE NEGATIVE Final    Comment: (NOTE) SARS-CoV-2 target nucleic acids are NOT DETECTED.  The SARS-CoV-2 RNA is generally detectable in upper respiratory specimens during the acute phase of infection. The lowest concentration of SARS-CoV-2 viral copies this assay can detect is 138 copies/mL. A negative result does not preclude SARS-Cov-2 infection and should not be used as the sole basis for treatment or other patient management decisions. A negative result may occur with  improper specimen collection/handling, submission of specimen other than nasopharyngeal swab, presence of viral mutation(s) within the areas targeted by this assay, and inadequate number of viral copies(<138 copies/mL). A negative result must be combined with clinical observations, patient history, and epidemiological information. The expected result is Negative.  Fact Sheet for Patients:  EntrepreneurPulse.com.au  Fact Sheet for Healthcare Providers:  IncredibleEmployment.be  This test is no t yet approved or cleared by the [place] FDA and  has been authorized for detection and/or diagnosis of SARS-CoV-2 by FDA under an Emergency Use Authorization (EUA). This EUA will remain  in effect (meaning this test can be used) for the duration of the COVID-19 declaration under Section 564(b)(1) of the Act, 21 U.S.C.section 360bbb-3(b)(1), unless the authorization is terminated  or revoked sooner.       Influenza A by PCR NEGATIVE NEGATIVE Final   Influenza B by PCR NEGATIVE NEGATIVE Final    Comment: (NOTE) The Xpert Xpress SARS-CoV-2/FLU/RSV plus assay is intended as an aid in the diagnosis of influenza from Nasopharyngeal swab specimens and should  not be used as a sole basis for treatment. Nasal washings and aspirates are unacceptable for Xpert Xpress SARS-CoV-2/FLU/RSV testing.  Fact Sheet for Patients: EntrepreneurPulse.com.au  Fact Sheet for Healthcare Providers: IncredibleEmployment.be  This test is not yet approved or cleared by the [place] FDA and has been authorized for detection and/or diagnosis of SARS-CoV-2 by FDA under an Emergency Use Authorization (EUA). This EUA will remain in effect (meaning this test can be used) for the duration  of the COVID-19 declaration under Section 564(b)(1) of the Act, 21 U.S.C. section 360bbb-3(b)(1), unless the authorization is terminated or revoked.  Performed at [facility], [place] [address] [place], [place] [address]   Culture, blood (routine x 2)     Status: None (Preliminary result)   Collection Time: 01/11/21 11:22 AM   Specimen: BLOOD  Result Value Ref Range Status   Specimen Description   Final    BLOOD RIGHT ANTECUBITAL Performed at [facility], [place] [address] [place], [place] [address]    Special Requests   Final    BOTTLES DRAWN AEROBIC AND ANAEROBIC Blood Culture adequate volume Performed at [place] [address] [place], [place] [address]    Culture   Final    NO GROWTH 3 DAYS Performed at [facility], [place] [address] [place], [place] [address]    Report Status PENDING  Incomplete  Culture, blood (routine x 2)     Status: None (Preliminary result)   Collection Time: 01/11/21 11:22 AM   Specimen: BLOOD  Result Value Ref Range Status   Specimen Description   Final    BLOOD LEFT ANTECUBITAL Performed at [facility], [place] [address], [place], [place] [address]    Special Requests   Final    BOTTLES DRAWN AEROBIC AND ANAEROBIC Blood Culture adequate volume Performed at [facility] [address] [place], [place] [address]    Culture    Final    NO GROWTH 3 DAYS Performed at [facility], [place] [address] [place], [place] [address]    Report Status PENDING  Incomplete  Culture, Urine     Status: Abnormal   Collection Time: 01/11/21 11:40 AM   Specimen: Urine, Clean Catch  Result Value Ref Range Status   Specimen Description   Final    URINE, CLEAN CATCH Performed at [facility], [place] [address], [place], [place] [address]    Special Requests   Final    NONE Performed at [facility], [place] [address], [place], [place] [address]    Culture >=100,000 COLONIES/mL ESCHERICHIA COLI (A)  Final   Report Status 01/14/2021 FINAL  Final   Organism ID, Bacteria ESCHERICHIA COLI (A)  Final      Susceptibility   Escherichia coli - MIC*    AMPICILLIN 8 SENSITIVE Sensitive     CEFAZOLIN <=4 SENSITIVE Sensitive     CEFEPIME <=0.12 SENSITIVE Sensitive     CEFTRIAXONE <=0.25 SENSITIVE Sensitive     CIPROFLOXACIN <=0.25 SENSITIVE Sensitive     GENTAMICIN <=1 SENSITIVE Sensitive     IMIPENEM <=0.25 SENSITIVE Sensitive     NITROFURANTOIN <=16 SENSITIVE Sensitive     TRIMETH/SULFA <=20 SENSITIVE Sensitive     AMPICILLIN/SULBACTAM 4 SENSITIVE Sensitive     PIP/TAZO <=4 SENSITIVE Sensitive     * >=100,000 COLONIES/mL ESCHERICHIA COLI    Radiology Studies: DG CHEST PORT 1 VIEW  Result Date: 01/14/2021 CLINICAL DATA:  Pneumonia, left chest pain EXAM: PORTABLE CHEST 1 VIEW COMPARISON:  Radiograph 01/11/2021 FINDINGS: Persistent consolidative opacity in the left lung base, increased from prior with some increasingly conspicuous opacity in the periphery of the right lower lung as well. Suspect small left and trace right effusion. No pneumothorax. Cardiomediastinal contours are stable. No acute osseous or soft tissue abnormality. Telemetry leads overlie the chest. IMPRESSION: Increasing consolidation in the left lung base and periphery of the right lower lobe,  concerning for worsening airspace  disease and/or atelectasis. Electronically Signed   By: Lovena Le M.D.   On: 01/14/2021 05:57    Scheduled Meds:  apixaban  10 mg Oral BID   Followed by   Derrill Memo ON 01/18/2021] apixaban  5 mg Oral BID   ARIPiprazole  10 mg Oral Daily   azithromycin  500 mg Oral Daily   busPIRone  10 mg Oral TID   folic acid  1 mg Oral Daily   lamoTRIgine  25 mg Oral Daily   multivitamin with minerals  1 tablet Oral Daily   nicotine  21 mg Transdermal Daily   sertraline  50 mg Oral Daily   sodium chloride flush  3 mL Intravenous Q12H   thiamine  100 mg Oral Daily   Or   thiamine  100 mg Intravenous Daily   Continuous Infusions:  sodium chloride 100 mL/hr at 01/13/21 1650   cefTRIAXone (ROCEPHIN)  IV 2 g (01/13/21 1225)     LOS: 6 days    Time spent: 25 mins    Onie Hayashi, MD [facility]   If 7PM-7AM, please contact night-coverage

## 2021-01-15 ENCOUNTER — Other Ambulatory Visit (HOSPITAL_COMMUNITY): Payer: Self-pay

## 2021-01-15 ENCOUNTER — Inpatient Hospital Stay (HOSPITAL_COMMUNITY): Payer: Self-pay

## 2021-01-15 LAB — CBC
HCT: 38.2 % (ref 36.0–46.0)
Hemoglobin: 12 g/dL (ref 12.0–15.0)
MCH: 29.9 pg (ref 26.0–34.0)
MCHC: 31.4 g/dL (ref 30.0–36.0)
MCV: 95 fL (ref 80.0–100.0)
Platelets: 390 10*3/uL (ref 150–400)
RBC: 4.02 MIL/uL (ref 3.87–5.11)
RDW: 15.6 % — ABNORMAL HIGH (ref 11.5–15.5)
WBC: 5.4 10*3/uL (ref 4.0–10.5)
nRBC: 0 % (ref 0.0–0.2)

## 2021-01-15 LAB — PHOSPHORUS: Phosphorus: 3.5 mg/dL (ref 2.5–4.6)

## 2021-01-15 LAB — BASIC METABOLIC PANEL
Anion gap: 9 (ref 5–15)
BUN: <5 mg/dL — ABNORMAL LOW (ref 6–20)
CO2: 24 mmol/L (ref 22–32)
Calcium: 8.4 mg/dL — ABNORMAL LOW (ref 8.9–10.3)
Chloride: 107 mmol/L (ref 98–111)
Creatinine, Ser: 0.49 mg/dL (ref 0.44–1.00)
GFR, Estimated: >60 mL/min (ref >60)
Glucose, Bld: 74 mg/dL (ref 70–99)
Potassium: 3.5 mmol/L (ref 3.5–5.1)
Sodium: 140 mmol/L (ref 135–145)

## 2021-01-15 LAB — MAGNESIUM: Magnesium: 1.9 mg/dL (ref 1.7–2.4)

## 2021-01-15 LAB — TROPONIN I (HIGH SENSITIVITY)
Troponin I (High Sensitivity): 3 ng/L (ref <18)
Troponin I (High Sensitivity): 3 ng/L (ref <18)

## 2021-01-15 MED ORDER — OXYCODONE-ACETAMINOPHEN 5-325 MG PO TABS
1.0000 | ORAL_TABLET | Freq: Four times a day (QID) | ORAL | Status: DC | PRN
  Administered 2021-01-15 – 2021-01-18 (×9): 2 via ORAL
  Filled 2021-01-15 (×9): qty 2

## 2021-01-15 MED ORDER — IOHEXOL 350 MG/ML SOLN
100.0000 mL | Freq: Once | INTRAVENOUS | Status: AC | PRN
  Administered 2021-01-15: 100 mL via INTRAVENOUS

## 2021-01-15 MED ORDER — LORAZEPAM 2 MG/ML IJ SOLN
0.5000 mg | Freq: Once | INTRAMUSCULAR | Status: AC
  Administered 2021-01-16: 0.5 mg via INTRAVENOUS
  Filled 2021-01-15: qty 1

## 2021-01-15 NOTE — Consult Note (Signed)
Chief Complaint: Patient was seen in consultation today for left lower extremity venography with catheter directed thrombectomy/clot retrieval via Inari device Chief Complaint  Patient presents with   Leg Pain   detox    Referring Physician(s): Regalado,B  Supervising Physician: Malachy Moan  Patient Status: [facility] - In-pt  History of Present Illness: Kristina Owens is a 55 y.o. female with past medical history significant for anxiety, depression, GERD, bipolar disorder, polysubstance abuse, alcohol abuse who presented to [facility] ED on 6/7 /22 with 2-week history of left leg pain. She had used crack cocaine, methamphetamine and alcohol prior to admission.  She was subsequently found to have elevated D-dimer.  Bilateral lower extremity venous duplex revealed acute DVT involving the left common femoral, left femoral, popliteal, posterior tibial, peroneal, soleal and gastrocnemius veins.  CT chest revealed multiple bilateral PE including saddle embolus with CT evidence of right heart strain.  She was admitted for DVT and PE and started on Lovenox and later transitioned to Eliquis.  She was also treated for UTI.  She was seen by our service on 6/7 and at that time there was minimal leg swelling and she was hemodynamically stable with minimal cardiovascular effects of PE and felt not to be candidate for PE thrombectomy.  Today patient was complaining of the onset of pleuritic left chest pain as well as worsening left lower extremity pain/ edema.  IR service was again contacted and recommended CT venogram of the abdomen pelvis.  This revealed:  1. No evidence of iliac vein compression physiology or ileo caval DVT. 2. Positive for occlusive DVT in the left common femoral vein, femoral vein, profunda femoral vein and saphenofemoral junction with associated left upper thigh edema. 3. Small left pleural effusion with associated left lower lobe atelectasis. 4. Expected evolution of  pulmonary emboli within the visualized pulmonary arteries. No evidence of right heart strain.  Request now received for endovascular intervention/left lower extremity venography with thrombectomy/clot retrieval    Past Medical History:  Diagnosis Date   Anxiety    Depression    GERD (gastroesophageal reflux disease)    Ovarian cyst     Past Surgical History:  Procedure Laterality Date   APPENDECTOMY  age 40   TONSILLECTOMY Bilateral age 28    Allergies: Patient has no known allergies.  Medications: Prior to Admission medications   Medication Sig Start Date End Date Taking? Authorizing Provider  cephALEXin (KEFLEX) 500 MG capsule Take 1 capsule (500 mg total) by mouth 4 (four) times daily. 01/01/21  Yes Delo, Riley Lam, MD  HYDROcodone-acetaminophen (NORCO) 5-325 MG tablet Take 1-2 tablets by mouth every 6 (six) hours as needed. Patient taking differently: Take 1-2 tablets by mouth every 6 (six) hours as needed for moderate pain. 01/01/21  Yes Delo, Riley Lam, MD  ARIPiprazole (ABILIFY) 10 MG tablet TAKE 1 TABLET(10 MG) BY MOUTH DAILY Patient not taking: No sig reported 10/15/20   Zena Amos, MD  benzonatate (TESSALON) 200 MG capsule Take 1 capsule (200 mg total) by mouth every 8 (eight) hours. Patient not taking: No sig reported 11/27/20   Dartha Lodge, PA-C  busPIRone (BUSPAR) 10 MG tablet TAKE 1 TABLET(10 MG) BY MOUTH THREE TIMES DAILY Patient not taking: No sig reported 10/29/20   Zena Amos, MD  lamoTRIgine (LAMICTAL) 100 MG tablet Take 1 tablet (100 mg total) by mouth 2 (two) times daily. Patient not taking: No sig reported 10/01/20   Zena Amos, MD  sertraline (ZOLOFT) 50 MG tablet Take 1 tablet (  50 mg total) by mouth daily. Patient not taking: No sig reported 10/01/20   Zena Amos, MD     Family History  Problem Relation Age of Onset   Asthma Mother    COPD Mother    Cancer Father        thyroid cancer    Social History   Socioeconomic History   Marital  status: Divorced    Spouse name: Not on file   Number of children: Not on file   Years of education: Not on file   Highest education level: Not on file  Occupational History   Not on file  Tobacco Use   Smoking status: Every Day    Packs/day: 0.50    Years: 35.00    Pack years: 17.50    Types: Cigarettes   Smokeless tobacco: Never  Vaping Use   Vaping Use: Never used  Substance and Sexual Activity   Alcohol use: Yes   Drug use: Yes    Types: "Crack" cocaine, Cocaine, Methamphetamines    Comment: last used 4/31/22   Sexual activity: Not on file  Other Topics Concern   Not on file  Social History Narrative   Not on file   Social Determinants of Health   Financial Resource Strain: Not on file  Food Insecurity: Not on file  Transportation Needs: Not on file  Physical Activity: Not on file  Stress: Not on file  Social Connections: Not on file      Review of Systems denies fever, headache, back pain, vomiting or bleeding.  She does have left upper chest discomfort especially with deep breathing, persistent dyspnea, occasional dry cough, left lateral abdominal discomfort, occasional nausea, left lower extremity pain/heaviness  Vital Signs: BP 110/81 (BP Location: Right Arm)   Pulse 77   Temp 98.7 F (37.1 C) (Oral)   Resp 18   Ht 5\' 3"  (1.6 m)   Wt 150 lb (68 kg)   SpO2 97%   BMI 26.57 kg/m   Physical Exam awake, alert.  Chest with diminished breath sounds bases, distant breath sounds in general; heart with regular rate and rhythm.  Abdomen soft, few bowel sounds, mildly tender left lateral abdominal region to palpation, 1-2+ left lower extremity edema ,1+ DP/PT pulses on left, no significant right lower extremity edema  Imaging: CT Angio Chest PE W/Cm &/Or Wo Cm  Result Date: 01/08/2021 CLINICAL DATA:  Positive for DVT today, leg pain, clinically suspected pulmonary embolism EXAM: CT ANGIOGRAPHY CHEST WITH CONTRAST TECHNIQUE: Multidetector CT imaging of the chest  was performed using the standard protocol during bolus administration of intravenous contrast. Multiplanar CT image reconstructions and MIPs were obtained to evaluate the vascular anatomy. CONTRAST:  52mL OMNIPAQUE IOHEXOL 350 MG/ML SOLN IV COMPARISON:  None FINDINGS: Cardiovascular: Aorta normal caliber without aneurysm or dissection. Heart unremarkable. No pericardial effusion. Pulmonary arteries adequately opacified. Multiple filling defects are seen within the pulmonary arteries bilaterally consistent with pulmonary emboli. These include saddle embolus at the bifurcation of the RIGHT pulmonary artery, saddle embolus at bifurcation of RIGHT middle and RIGHT lower lobe pulmonary arteries, and within BILATERAL lower lobes and RIGHT middle lobe. RV/LV ratio = 1.01, elevated, consistent with RIGHT heart strain. Mediastinum/Nodes: Esophagus unremarkable. Base of cervical region normal appearance. No thoracic adenopathy. Lungs/Pleura: Scattered atelectasis. Small foci of infiltrate RIGHT upper lobe laterally and in lingula. No additional consolidation, pleural effusion, or pneumothorax. Upper Abdomen: Visualized upper abdomen unremarkable Musculoskeletal: No acute osseous findings. Review of the MIP images confirms the  above findings. IMPRESSION: Multiple BILATERAL pulmonary emboli including saddle embolus at the bifurcation of the RIGHT pulmonary artery, saddle embolus at bifurcation of RIGHT middle and RIGHT lower lobe pulmonary arteries, and within BILATERAL lower lobes and RIGHT middle lobe. Positive for acute PE with CT evidence of right heart strain (RV/LV Ratio = 1.01) consistent with at least submassive (intermediate risk) PE. The presence of right heart strain has been associated with an increased risk of morbidity and mortality. Please refer to the "PE Focused" order set in EPIC. Scattered atelectasis with small foci of infiltrate in RIGHT upper lobe laterally and in lingula. Critical Value/emergent results  were called by telephone at the time of interpretation on 01/08/2021 at 10:52 am to provider Meridee Score MD, who verbally acknowledged these results. Electronically Signed   By: Ulyses Southward M.D.   On: 01/08/2021 10:53   DG CHEST PORT 1 VIEW  Result Date: 01/14/2021 CLINICAL DATA:  Pneumonia, left chest pain EXAM: PORTABLE CHEST 1 VIEW COMPARISON:  Radiograph 01/11/2021 FINDINGS: Persistent consolidative opacity in the left lung base, increased from prior with some increasingly conspicuous opacity in the periphery of the right lower lung as well. Suspect small left and trace right effusion. No pneumothorax. Cardiomediastinal contours are stable. No acute osseous or soft tissue abnormality. Telemetry leads overlie the chest. IMPRESSION: Increasing consolidation in the left lung base and periphery of the right lower lobe, concerning for worsening airspace disease and/or atelectasis. Electronically Signed   By: Kreg Shropshire M.D.   On: 01/14/2021 05:57   DG CHEST PORT 1 VIEW  Result Date: 01/11/2021 CLINICAL DATA:  Pneumonia. EXAM: PORTABLE CHEST 1 VIEW COMPARISON:  None. FINDINGS: The heart size and mediastinal contours are within normal limits. Right lung is clear. Mild left basilar atelectasis or infiltrate is noted. The visualized skeletal structures are unremarkable. IMPRESSION: Mild left basilar atelectasis or infiltrate. Electronically Signed   By: Lupita Raider M.D.   On: 01/11/2021 11:54   DG Foot Complete Left  Result Date: 01/01/2021 CLINICAL DATA:  Left ankle and foot pain, worse with flexion extension EXAM: LEFT FOOT - COMPLETE 3+ VIEW COMPARISON:  None. FINDINGS: Frontal, oblique, and lateral views of the left foot are obtained. No fracture, subluxation, or dislocation. There is mild joint space narrowing of the first metatarsophalangeal joint. Remaining joint spaces are unremarkable. The soft tissues are normal. IMPRESSION: 1. Mild osteoarthritis of the first metatarsophalangeal joint.  Otherwise unremarkable left foot. Electronically Signed   By: Sharlet Salina M.D.   On: 01/01/2021 02:00   CT VENOGRAM ABD/PEL  Result Date: 01/15/2021 CLINICAL DATA:  55 year old female with pulmonary emboli and acute left lower extremity DVT. She is having persistent and worsening left lower extremity pain and swelling despite being on anticoagulation. Evaluate for evidence of iliac vein compression syndrome. EXAM: CT VENOGRAM ABDOMEN AND PELVIS TECHNIQUE: CT of the abdomen and pelvis obtained following administration of intravenous contrast. Vascular timing optimized for venous phase imaging. CONTRAST:  OMNIPAQUE IOHEXOL 350 MG/ML SOLN COMPARISON:  CT abdomen/pelvis 07/05/2019 FINDINGS: VASCULAR IVC: Widely patent IVC. No evidence of IVC duplication or other congenital anomaly. Hepatic/portal veins: Widely patent. Renal veins: Widely patent.  Normal anatomy. Visceral veins: IMV and SMV are widely patent and unremarkable. Iliac veins: Bilateral common, internal and external iliac veins are widely patent. No evidence of iliac vein compression physiology. Femoral veins: Marked expansion of the left common femoral vein with central filling defect consistent with DVT. DVT extends into the profunda and femoral vein  as well as just into the saphenofemoral junction. There is associated inflammatory stranding within the perivenous fat. The right femoral veins are patent and unremarkable. NON VASCULAR Lower chest: Small left pleural effusion with associated left lower lobe atelectasis. No evidence of pulmonary infarction. Limited visualization of the left lower lobe pulmonary arteries demonstrates some residual linear filling defects consistent with evolving pulmonary emboli. The heart is normal in size. No evidence of right heart strain. Hepatobiliary: Geographic hypoattenuation in the left hemi-liver adjacent to the fissure for the falciform ligament is nonspecific but most suggestive of benign focal fatty  infiltration. Normal hepatic contour and morphology. No discrete hepatic lesions. Normal appearance of the gallbladder. No intra or extrahepatic biliary ductal dilatation. Pancreas: No mass or inflammatory changes Spleen: Normal size and morphology. Genitourinary: Normal adrenal glands. No evidence of hydronephrosis, nephrolithiasis or enhancing renal mass. The ureters and kidneys are also unremarkable. The bladder is normal. Bowel: No evidence of obstruction or focal bowel wall thickening. The terminal ileum is unremarkable. Reproductive: Normal uterus and adnexa.  No adnexal mass. Other: No significant ascites or abdominal wall hernia. Edema present in the left upper thigh and inguinal region. Musculoskeletal: No acute fracture or aggressive appearing lytic or blastic osseous lesion. IMPRESSION: 1. No evidence of iliac vein compression physiology or ileo caval DVT. 2. Positive for occlusive DVT in the left common femoral vein, femoral vein, profunda femoral vein and saphenofemoral junction with associated left upper thigh edema. 3. Small left pleural effusion with associated left lower lobe atelectasis. 4. Expected evolution of pulmonary emboli within the visualized pulmonary arteries. No evidence of right heart strain. Signed, Sterling Big, MD, RPVI [facility] Electronically Signed   By: Malachy Moan M.D.   On: 01/15/2021 16:31   VAS [place] LOWER EXTREMITY VENOUS (DVT) ([facility] and [facility] 7a-7p)  Result Date: 01/08/2021  Lower Venous DVT Study Patient Name:  KANDAS OLIVETO  Date of Exam:   01/08/2021 Medical Rec #: [id number]          Accession #:    [id number] Date of Birth: January 13, 1966         Patient Gender: F Patient Age:   54Y Exam Location:  [facility] Procedure:      VAS [place] LOWER EXTREMITY VENOUS (DVT) Referring Phys: [id number] MICHAEL C BUTLER --------------------------------------------------------------------------------  Indications:  Swelling.  Risk Factors: None identified. Comparison Study: No prior studies. Performing Technologist: Chanda Busing RVT  Examination Guidelines: A complete evaluation includes B-mode imaging, spectral Doppler, color Doppler, and power Doppler as needed of all accessible portions of each vessel. Bilateral testing is considered an integral part of a complete examination. Limited examinations for reoccurring indications may be performed as noted. The reflux portion of the exam is performed with the patient in reverse Trendelenburg.  +-----+---------------+---------+-----------+----------+--------------+ RIGHTCompressibilityPhasicitySpontaneityPropertiesThrombus Aging +-----+---------------+---------+-----------+----------+--------------+ CFV  Full           Yes      Yes                                 +-----+---------------+---------+-----------+----------+--------------+   +---------+---------------+---------+-----------+----------+--------------+ LEFT     CompressibilityPhasicitySpontaneityPropertiesThrombus Aging +---------+---------------+---------+-----------+----------+--------------+ CFV      Partial        No       No                   Acute          +---------+---------------+---------+-----------+----------+--------------+  SFJ      Full                                                        +---------+---------------+---------+-----------+----------+--------------+ FV Prox  None                                         Acute          +---------+---------------+---------+-----------+----------+--------------+ FV Mid   None           No       No                   Acute          +---------+---------------+---------+-----------+----------+--------------+ FV DistalNone           No       No                   Acute          +---------+---------------+---------+-----------+----------+--------------+ PFV      Full                                                         +---------+---------------+---------+-----------+----------+--------------+ POP      None           No       No                   Acute          +---------+---------------+---------+-----------+----------+--------------+ PTV      None                                         Acute          +---------+---------------+---------+-----------+----------+--------------+ PERO     None                                         Acute          +---------+---------------+---------+-----------+----------+--------------+ Soleal   None                                         Acute          +---------+---------------+---------+-----------+----------+--------------+ Gastroc  Partial                                      Acute          +---------+---------------+---------+-----------+----------+--------------+ EIV      Full           No       No                                  +---------+---------------+---------+-----------+----------+--------------+  Summary: RIGHT: - There is no evidence of deep vein thrombosis in the lower extremity.  LEFT: - Findings consistent with acute deep vein thrombosis involving the left common femoral vein, left femoral vein, left popliteal vein, left posterior tibial veins, left peroneal veins, left soleal veins, and left gastrocnemius veins. - No cystic structure found in the popliteal fossa.  *See table(s) above for measurements and observations. Electronically signed by Fabienne Bruns MD on 01/08/2021 at 4:28:42 PM.    Final     Labs:  CBC: Recent Labs    01/11/21 0454 01/12/21 0533 01/13/21 0543 01/15/21 0446  WBC 12.0* 8.4 6.1 5.4  HGB 14.3 12.5 11.7* 12.0  HCT 44.1 38.7 36.3 38.2  PLT 201 219 257 390    COAGS: Recent Labs    01/08/21 0915  INR 1.1    BMP: Recent Labs    01/11/21 0454 01/12/21 0533 01/13/21 0543 01/14/21 0458 01/15/21 0446  NA 130* 133* 135  --  140  K 4.1 4.1 3.4* 3.5 3.5  CL 98 102 105  --   107  CO2 22 23 23   --  24  GLUCOSE 107* 83 83  --  74  BUN 10 8 6   --  <5*  CALCIUM 8.2* 8.1* 7.8*  --  8.4*  CREATININE 0.61 0.39* 0.42*  --  0.49  GFRNONAA >60 >60 >60  --  >60    LIVER FUNCTION TESTS: No results for input(s): BILITOT, AST, ALT, ALKPHOS, PROT, ALBUMIN in the last 8760 hours.  TUMOR MARKERS: No results for input(s): AFPTM, CEA, CA199, CHROMGRNA in the last 8760 hours.  Assessment and Plan: 55 y.o. female with past medical history significant for anxiety, depression, GERD, bipolar disorder, polysubstance abuse, alcohol abuse who presented to ED on 6/7 /22 with 2-week history of left leg pain. She had used crack cocaine, methamphetamine and alcohol prior to admission.  She was subsequently found to have elevated D-dimer.  Bilateral lower extremity venous duplex revealed acute DVT involving the left common femoral, left femoral, popliteal, posterior tibial, peroneal, soleal and gastrocnemius veins.  CT chest revealed multiple bilateral PE including saddle embolus with CT evidence of right heart strain.  She was admitted for DVT and PE and started on Lovenox and later transitioned to Eliquis.  She was also treated for UTI.  She was seen by our service on 6/7 and at that time there was minimal leg swelling and she was hemodynamically stable with minimal cardiovascular effects of PE and felt not to be candidate for PE thrombectomy.  Today patient was complaining of the onset of pleuritic left chest pain as well as worsening left lower extremity pain/ edema.  IR service was again contacted and recommended CT venogram of the abdomen pelvis.  This revealed:  1. No evidence of iliac vein compression physiology or ileo caval DVT. 2. Positive for occlusive DVT in the left common femoral vein, femoral vein, profunda femoral vein and saphenofemoral junction with associated left upper thigh edema. 3. Small left pleural effusion with associated left lower lobe atelectasis. 4.  Expected evolution of pulmonary emboli within the visualized pulmonary arteries. No evidence of right heart strain.  Request now received for endovascular intervention/left lower extremity venography with thrombectomy/clot retrieval, possible angioplasty via Inari device.  Latest imaging studies were reviewed by Dr. 08-21-1969.  Details/risks of above procedure, including but not limited to, internal bleeding, infection, vascular injury, inability to completely remove all clot discussed with patient with her understanding and consent.  Procedure scheduled for 6/15.   Thank you for this interesting consult.  I greatly enjoyed meeting [facility] and look forward to participating in their care.  A copy of this report was sent to the requesting provider on this date.  Electronically Signed: D. Jeananne RamaKevin Jadence Kinlaw, PA-C 01/15/2021, 4:47 PM   I spent a total of   30 minutes  in face to face in clinical consultation, greater than 50% of which was counseling/coordinating care for left lower extremity venography with catheter directed thrombectomy/clot retrieval/possible angioplasty

## 2021-01-15 NOTE — Progress Notes (Signed)
PROGRESS NOTE    Kristina Owens  BHA:[id number] DOB: [birth year] DOA: 01/08/2021 PCP: Haywood Filler., FNP   Brief Narrative: 55 year old with past medical history significant for anxiety, depression, GERD, bipolar, polysubstance abuse, alcohol abuse presented to ED with 2 weeks history of left leg pain.  Patient mostly recently used crack cocaine and methamphetamine and alcohol today prior to admission.  Patient was found to have elevated D-dimer at 1.3, bilateral lower extremity duplex: Positive for acute DVT involving left common femoral, left femoral, popliteal, posterior tibial, peroneal, soleal and gastrocnemius veins.  CTA chest showed multiple bilateral PE including saddle embolus with CT evidence of right heart strain. Patient was admitted for DVT and PE started on Lovenox.  She was subsequently transition to Eliquis.  She is spiking fever, and treated with IV antibiotics for UTI.    Assessment & Plan:   Principal Problem:   Bipolar disorder, in full remission, most recent episode depressed ([place]) Active Problems:   Generalized anxiety disorder   Alcohol use disorder, severe, in early remission, dependence ([place])   Pulmonary embolism ([place])   DVT (deep venous thrombosis) ([facility])   Polysubstance (excluding opioids) dependence ([place])   Alcohol abuse with withdrawal ([place])   1-Acute unprovoked Left Lower extremity DVT/acute saddle PE with right heart strain by CT: -Patient was initially on Lovenox.  Subsequently transition to Eliquis 6/09.  -CTA;  Shows multiple bilateral PE including saddle embolus, and the bifurcation of the right pulmonary artery, saddle embolus at the bifurcation of right middle and right lower lobe pulmonary arteries and within bilateral lower lobes and right middle lobe. -Doppler lower extremity: Acute deep vein thrombosis involving the left common femoral vein, left femoral vein, left popliteal vein, left posterior tibial veins, left peroneal veins, left  soleal veins and left gastrocnemius veins. -Complaining today of new onset chest pain, pleuritic.  Suspect related to atelectasis.  Patient vitals has remained stable.  Oxygen saturation 97 on room air, blood pressure in the normal range.  Troponins x2 negative. -Plan to proceed with 2D echo monitor vitals. -Worsening lower extremity edema, IR reconsulted for evaluation, plan for CT venogram abdomen and pelvis. -incentive spirometry.   2-Sepsis, Secondary to UTI/pneumonia: Met sepsis criteria with leukocytosis, tachycardia, tachypnea. Treated initially with ceftriaxone and azithromycin. Now on ceftriaxone.   3-polysubstance abuse: Counseling provided  Alcohol abuse, suspected alcohol withdrawal: She was monitored on CIWA protocol  Depression disorder, bipolar: Medication resume Lamictal BuSpar Zoloft and Abilify  Tobacco use counseling provided. .     Estimated body mass index is 26.57 kg/m as calculated from the following:   Height as of this encounter: _0  (1.6 m).   Weight as of this encounter: 68 kg.   DVT prophylaxis: Eliquis,  Code Status: Full code Family Communication: care discussed with patient.  Disposition Plan:  Status is: Inpatient  Remains inpatient appropriate because:Ongoing active pain requiring inpatient pain management  Dispo: The patient is from: homeless               Anticipated d/c is to:  shelter              Patient currently is not medically stable to d/c.   Difficult to place patient No        Consultants:  IR  Procedures:  ECHO pending  Antimicrobials:    Subjective: She is complaining of chest pain, and SOB> chest pain started last night, pleuritic chest pain. SOB is similar.  She also report mild dizziness.  She complaint of worsening Left LE edema, and persistent pain.,   Objective: Vitals:   01/15/21 0800 01/15/21 1333 01/15/21 1335 01/15/21 1337  BP: 120/68 126/70 125/78 110/81  Pulse: 63 63 71 77  Resp: 18 18     Temp:  98.7 F (37.1 C)    TempSrc:  Oral    SpO2: 97% 97%    Weight:      Height:        Intake/Output Summary (Last 24 hours) at 01/15/2021 1346 Last data filed at 01/15/2021 1342 Gross per 24 hour  Intake 1560 ml  Output 725 ml  Net 835 ml   Filed Weights   01/08/21 1207  Weight: 68 kg    Examination:  General exam: Appears calm and comfortable  Respiratory system: Clear to auscultation. Respiratory effort normal. Cardiovascular system: S1 & S2 heard, RRR. No JVD, murmurs, rubs, gallops or clicks. No pedal edema. Gastrointestinal system: Abdomen is nondistended, soft and nontender. No organomegaly or masses felt. Normal bowel sounds heard. Central nervous system: Alert and oriented. No focal neurological deficits. Extremities: Left LE edema plus 2.    Data Reviewed: I have personally reviewed following labs and imaging studies  CBC: Recent Labs  Lab 01/10/21 0513 01/11/21 0454 01/12/21 0533 01/13/21 0543 01/15/21 0446  WBC 8.6 12.0* 8.4 6.1 5.4  HGB 15.0 14.3 12.5 11.7* 12.0  HCT 47.6* 44.1 38.7 36.3 38.2  MCV 95.6 93.6 93.0 94.3 95.0  PLT 162 201 219 257 093   Basic Metabolic Panel: Recent Labs  Lab 01/08/21 1740 01/09/21 0518 01/10/21 0513 01/11/21 0454 01/12/21 0533 01/13/21 0543 01/14/21 0458 01/15/21 0446  NA  --    < > 134* 130* 133* 135  --  140  K  --    < > 4.4 4.1 4.1 3.4* 3.5 3.5  CL  --    < > 103 98 102 105  --  107  CO2  --    < > _0 --  24  GLUCOSE  --    < > 103* 107* 83 83  --  74  BUN  --    < > _1 --  <5*  CREATININE  --    < > 0.72 0.61 0.39* 0.42*  --  0.49  CALCIUM  --    < > 8.2* 8.2* 8.1* 7.8*  --  8.4*  MG 2.0  --   --   --  1.9  --   --  1.9  PHOS  --   --   --   --  2.4*  --   --  3.5   < > = values in this interval not displayed.   GFR: Estimated Creatinine Clearance: 75.2 mL/min (by C-G formula based on SCr of 0.49 mg/dL). Liver Function Tests: No results for input(s): AST, ALT, ALKPHOS, BILITOT,  PROT, ALBUMIN in the last 168 hours. No results for input(s): LIPASE, AMYLASE in the last 168 hours. No results for input(s): AMMONIA in the last 168 hours. Coagulation Profile: No results for input(s): INR, PROTIME in the last 168 hours. Cardiac Enzymes: No results for input(s): CKTOTAL, CKMB, CKMBINDEX, TROPONINI in the last 168 hours. BNP (last 3 results) No results for input(s): PROBNP in the last 8760 hours. HbA1C: No results for input(s): HGBA1C in the last 72 hours. CBG: No results for input(s): GLUCAP in the last 168 hours. Lipid Profile: No results for input(s): CHOL, HDL, LDLCALC, TRIG, CHOLHDL,  LDLDIRECT in the last 72 hours. Thyroid Function Tests: No results for input(s): TSH, T4TOTAL, FREET4, T3FREE, THYROIDAB in the last 72 hours. Anemia Panel: No results for input(s): VITAMINB12, FOLATE, FERRITIN, TIBC, IRON, RETICCTPCT in the last 72 hours. Sepsis Labs: Recent Labs  Lab 01/11/21 1254 01/11/21 1537  PROCALCITON <0.10  --   LATICACIDVEN 0.6 0.7    Recent Results (from the past 240 hour(s))  Resp Panel by RT-PCR (Flu A&B, Covid) Nasopharyngeal Swab     Status: None   Collection Time: 01/08/21 12:20 PM   Specimen: Nasopharyngeal Swab; Nasopharyngeal(NP) swabs in vial transport medium  Result Value Ref Range Status   SARS Coronavirus 2 by RT PCR NEGATIVE NEGATIVE Final    Comment: (NOTE) SARS-CoV-2 target nucleic acids are NOT DETECTED.  The SARS-CoV-2 RNA is generally detectable in upper respiratory specimens during the acute phase of infection. The lowest concentration of SARS-CoV-2 viral copies this assay can detect is 138 copies/mL. A negative result does not preclude SARS-Cov-2 infection and should not be used as the sole basis for treatment or other patient management decisions. A negative result may occur with  improper specimen collection/handling, submission of specimen other than nasopharyngeal swab, presence of viral mutation(s) within the areas  targeted by this assay, and inadequate number of viral copies(<138 copies/mL). A negative result must be combined with clinical observations, patient history, and epidemiological information. The expected result is Negative.  Fact Sheet for Patients:  EntrepreneurPulse.com.au  Fact Sheet for Healthcare Providers:  IncredibleEmployment.be  This test is no t yet approved or cleared by the [place] FDA and  has been authorized for detection and/or diagnosis of SARS-CoV-2 by FDA under an Emergency Use Authorization (EUA). This EUA will remain  in effect (meaning this test can be used) for the duration of the COVID-19 declaration under Section 564(b)(1) of the Act, 21 U.S.C.section 360bbb-3(b)(1), unless the authorization is terminated  or revoked sooner.       Influenza A by PCR NEGATIVE NEGATIVE Final   Influenza B by PCR NEGATIVE NEGATIVE Final    Comment: (NOTE) The Xpert Xpress SARS-CoV-2/FLU/RSV plus assay is intended as an aid in the diagnosis of influenza from Nasopharyngeal swab specimens and should not be used as a sole basis for treatment. Nasal washings and aspirates are unacceptable for Xpert Xpress SARS-CoV-2/FLU/RSV testing.  Fact Sheet for Patients: EntrepreneurPulse.com.au  Fact Sheet for Healthcare Providers: IncredibleEmployment.be  This test is not yet approved or cleared by the [place] FDA and has been authorized for detection and/or diagnosis of SARS-CoV-2 by FDA under an Emergency Use Authorization (EUA). This EUA will remain in effect (meaning this test can be used) for the duration of the COVID-19 declaration under Section 564(b)(1) of the Act, 21 U.S.C. section 360bbb-3(b)(1), unless the authorization is terminated or revoked.  Performed at [facility], [place] [address], [place], [place] [address]   Culture, blood (routine x 2)     Status: None  (Preliminary result)   Collection Time: 01/11/21 11:22 AM   Specimen: BLOOD  Result Value Ref Range Status   Specimen Description   Final    BLOOD RIGHT ANTECUBITAL Performed at [facility], [place] [address], [place], [place] [address]    Special Requests   Final    BOTTLES DRAWN AEROBIC AND ANAEROBIC Blood Culture adequate volume Performed at [place] [address] [place], [place] [address]    Culture   Final    NO GROWTH 4 DAYS  Performed at [facility], [place] [address], [place], [place] [address]    Report Status PENDING  Incomplete  Culture, blood (routine x 2)     Status: None (Preliminary result)   Collection Time: 01/11/21 11:22 AM   Specimen: BLOOD  Result Value Ref Range Status   Specimen Description   Final    BLOOD LEFT ANTECUBITAL Performed at [facility], [place] [address] [place], [place] [address]    Special Requests   Final    BOTTLES DRAWN AEROBIC AND ANAEROBIC Blood Culture adequate volume Performed at [place] [address], [place], [place] [address]    Culture   Final    NO GROWTH 4 DAYS Performed at [facility], [place] [address], [place], [place] [address]    Report Status PENDING  Incomplete  Culture, Urine     Status: Abnormal   Collection Time: 01/11/21 11:40 AM   Specimen: Urine, Clean Catch  Result Value Ref Range Status   Specimen Description   Final    URINE, CLEAN CATCH Performed at [facility], [place] [address] [place], [place] [address]    Special Requests   Final    NONE Performed at [facility], [place] [address] [place], [place] [address]    Culture >=100,000 COLONIES/mL ESCHERICHIA COLI (A)  Final   Report Status 01/14/2021 FINAL  Final   Organism ID, Bacteria ESCHERICHIA COLI (A)  Final      Susceptibility   Escherichia coli - MIC*    AMPICILLIN 8 SENSITIVE Sensitive     CEFAZOLIN <=4 SENSITIVE Sensitive     CEFEPIME  <=0.12 SENSITIVE Sensitive     CEFTRIAXONE <=0.25 SENSITIVE Sensitive     CIPROFLOXACIN <=0.25 SENSITIVE Sensitive     GENTAMICIN <=1 SENSITIVE Sensitive     IMIPENEM <=0.25 SENSITIVE Sensitive     NITROFURANTOIN <=16 SENSITIVE Sensitive     TRIMETH/SULFA <=20 SENSITIVE Sensitive     AMPICILLIN/SULBACTAM 4 SENSITIVE Sensitive     PIP/TAZO <=4 SENSITIVE Sensitive     * >=100,000 COLONIES/mL ESCHERICHIA COLI         Radiology Studies: DG CHEST PORT 1 VIEW  Result Date: 01/14/2021 CLINICAL DATA:  Pneumonia, left chest pain EXAM: PORTABLE CHEST 1 VIEW COMPARISON:  Radiograph 01/11/2021 FINDINGS: Persistent consolidative opacity in the left lung base, increased from prior with some increasingly conspicuous opacity in the periphery of the right lower lung as well. Suspect small left and trace right effusion. No pneumothorax. Cardiomediastinal contours are stable. No acute osseous or soft tissue abnormality. Telemetry leads overlie the chest. IMPRESSION: Increasing consolidation in the left lung base and periphery of the right lower lobe, concerning for worsening airspace disease and/or atelectasis. Electronically Signed   By: Lovena Le M.D.   On: 01/14/2021 05:57        Scheduled Meds:  apixaban  10 mg Oral BID   Followed by   Derrill Memo ON 01/18/2021] apixaban  5 mg Oral BID   ARIPiprazole  10 mg Oral Daily   busPIRone  10 mg Oral TID   folic acid  1 mg Oral Daily   lamoTRIgine  25 mg Oral Daily   multivitamin with minerals  1 tablet Oral Daily   nicotine  21 mg Transdermal Daily   sertraline  50 mg Oral Daily   sodium chloride flush  3 mL Intravenous Q12H   thiamine  100 mg Oral Daily   Or   thiamine  100 mg Intravenous Daily   Continuous Infusions:  cefTRIAXone (ROCEPHIN)  IV 2 g (01/15/21 1230)     LOS: 7 days    Time spent: 35 minutes    Clarion Mooneyhan A Alissandra Geoffroy, MD [facility]   If 7PM-7AM, please contact night-coverage [email or web address]  01/15/2021, 1:46 PM

## 2021-01-15 NOTE — Progress Notes (Addendum)
   01/15/21 0800  Vitals  BP 120/68  MAP (mmHg) 81  BP Location Right Arm  BP Method Automatic  Pulse Rate 63  ECG Heart Rate 64  Resp 18  MEWS COLOR  MEWS Score Color Green  Oxygen Therapy  SpO2 97 %  MEWS Score  MEWS Temp 0  MEWS Systolic 0  MEWS Pulse 0  MEWS RR 0  MEWS LOC 0  MEWS Score 0  Patient complains of non radiating, left sided chest pain.Pt denies associated symptoms, SOB, N/V, dizziness, or weakness. Pain is worsened by deep breathing. Pt reports pain began early morning and has not resolved. EKG completed. Provider updated.

## 2021-01-16 ENCOUNTER — Inpatient Hospital Stay (HOSPITAL_COMMUNITY): Payer: Self-pay

## 2021-01-16 DIAGNOSIS — I2602 Saddle embolus of pulmonary artery with acute cor pulmonale: Secondary | ICD-10-CM

## 2021-01-16 HISTORY — PX: IR PTA VENOUS EXCEPT DIALYSIS CIRCUIT: IMG6126

## 2021-01-16 HISTORY — PX: IR US GUIDE VASC ACCESS LEFT: IMG2389

## 2021-01-16 HISTORY — PX: IR VENO/EXT/UNI LEFT: IMG675

## 2021-01-16 HISTORY — PX: IR THROMBECT VENO MECH MOD SED: IMG2300

## 2021-01-16 HISTORY — PX: IR VENOCAVAGRAM IVC: IMG678

## 2021-01-16 LAB — CBC WITH DIFFERENTIAL/PLATELET
Abs Immature Granulocytes: 0.05 10*3/uL (ref 0.00–0.07)
Basophils Absolute: 0 10*3/uL (ref 0.0–0.1)
Basophils Relative: 1 %
Eosinophils Absolute: 0.1 10*3/uL (ref 0.0–0.5)
Eosinophils Relative: 3 %
HCT: 39.5 % (ref 36.0–46.0)
Hemoglobin: 12.5 g/dL (ref 12.0–15.0)
Immature Granulocytes: 1 %
Lymphocytes Relative: 36 %
Lymphs Abs: 1.7 10*3/uL (ref 0.7–4.0)
MCH: 29.9 pg (ref 26.0–34.0)
MCHC: 31.6 g/dL (ref 30.0–36.0)
MCV: 94.5 fL (ref 80.0–100.0)
Monocytes Absolute: 0.6 10*3/uL (ref 0.1–1.0)
Monocytes Relative: 13 %
Neutro Abs: 2.2 10*3/uL (ref 1.7–7.7)
Neutrophils Relative %: 46 %
Platelets: 436 10*3/uL — ABNORMAL HIGH (ref 150–400)
RBC: 4.18 MIL/uL (ref 3.87–5.11)
RDW: 15.5 % (ref 11.5–15.5)
WBC: 4.7 10*3/uL (ref 4.0–10.5)
nRBC: 0 % (ref 0.0–0.2)

## 2021-01-16 LAB — ECHOCARDIOGRAM COMPLETE
AR max vel: 2.67 cm2
AV Area VTI: 2.44 cm2
AV Area mean vel: 2.53 cm2
AV Mean grad: 4 mmHg
AV Peak grad: 7.7 mmHg
Ao pk vel: 1.39 m/s
Area-P 1/2: 5.84 cm2
Height: 63 in
S' Lateral: 3.3 cm
Weight: 2400 oz

## 2021-01-16 LAB — BASIC METABOLIC PANEL
Anion gap: 7 (ref 5–15)
BUN: <5 mg/dL — ABNORMAL LOW (ref 6–20)
CO2: 24 mmol/L (ref 22–32)
Calcium: 8.2 mg/dL — ABNORMAL LOW (ref 8.9–10.3)
Chloride: 107 mmol/L (ref 98–111)
Creatinine, Ser: 0.47 mg/dL (ref 0.44–1.00)
GFR, Estimated: >60 mL/min (ref >60)
Glucose, Bld: 83 mg/dL (ref 70–99)
Potassium: 3.4 mmol/L — ABNORMAL LOW (ref 3.5–5.1)
Sodium: 138 mmol/L (ref 135–145)

## 2021-01-16 LAB — CULTURE, BLOOD (ROUTINE X 2)
Culture: NO GROWTH
Culture: NO GROWTH
Special Requests: ADEQUATE
Special Requests: ADEQUATE

## 2021-01-16 LAB — PROTIME-INR
INR: 1.2 (ref 0.8–1.2)
Prothrombin Time: 15.4 seconds — ABNORMAL HIGH (ref 11.4–15.2)

## 2021-01-16 MED ORDER — LIDOCAINE-EPINEPHRINE 1 %-1:100000 IJ SOLN
INTRAMUSCULAR | Status: AC | PRN
  Administered 2021-01-16: 5 mL
  Administered 2021-01-16: 10 mL

## 2021-01-16 MED ORDER — HEPARIN SODIUM (PORCINE) 1000 UNIT/ML IJ SOLN
INTRAMUSCULAR | Status: AC
  Filled 2021-01-16: qty 1

## 2021-01-16 MED ORDER — MIDAZOLAM HCL 2 MG/2ML IJ SOLN
INTRAMUSCULAR | Status: AC
  Filled 2021-01-16: qty 6

## 2021-01-16 MED ORDER — POTASSIUM CHLORIDE CRYS ER 20 MEQ PO TBCR
40.0000 meq | EXTENDED_RELEASE_TABLET | Freq: Once | ORAL | Status: AC
  Administered 2021-01-16: 40 meq via ORAL
  Filled 2021-01-16: qty 2

## 2021-01-16 MED ORDER — LIDOCAINE HCL 1 % IJ SOLN
INTRAMUSCULAR | Status: AC
  Filled 2021-01-16: qty 20

## 2021-01-16 MED ORDER — FENTANYL CITRATE (PF) 100 MCG/2ML IJ SOLN
INTRAMUSCULAR | Status: AC
  Filled 2021-01-16: qty 2

## 2021-01-16 MED ORDER — DIPHENHYDRAMINE HCL 50 MG/ML IJ SOLN
INTRAMUSCULAR | Status: AC | PRN
  Administered 2021-01-16: 25 mg via INTRAVENOUS

## 2021-01-16 MED ORDER — FENTANYL CITRATE (PF) 100 MCG/2ML IJ SOLN
INTRAMUSCULAR | Status: AC
  Filled 2021-01-16: qty 4

## 2021-01-16 MED ORDER — DIPHENHYDRAMINE HCL 50 MG/ML IJ SOLN
INTRAMUSCULAR | Status: AC
  Filled 2021-01-16: qty 1

## 2021-01-16 MED ORDER — IOHEXOL 300 MG/ML  SOLN: 100.0000 mL | Freq: Once | INTRAMUSCULAR | Status: DC | PRN

## 2021-01-16 MED ORDER — FENTANYL CITRATE (PF) 100 MCG/2ML IJ SOLN
INTRAMUSCULAR | Status: AC | PRN
  Administered 2021-01-16 (×6): 50 ug via INTRAVENOUS

## 2021-01-16 MED ORDER — HEPARIN SODIUM (PORCINE) 1000 UNIT/ML IJ SOLN
INTRAMUSCULAR | Status: AC | PRN
  Administered 2021-01-16: 5000 [IU] via INTRAVENOUS

## 2021-01-16 MED ORDER — MIDAZOLAM HCL 2 MG/2ML IJ SOLN
INTRAMUSCULAR | Status: AC | PRN
  Administered 2021-01-16 (×6): 1 mg via INTRAVENOUS

## 2021-01-16 NOTE — Procedures (Signed)
Interventional Radiology Procedure Note  Procedure: Left lower extremity catheter directed mechanical thrombectomy with PTA of common femoral vein to 12 mm.   Complications: None  Estimated Blood Loss: 25 mL  Recommendations: - Bedrest x 2 hrs - Assess left popliteal; fossa access site for hematoma - Ace wrap to right leg - Keep right leg elevated - Encourage ambulation beginning tomorrow   Signed,  Sterling Big, MD

## 2021-01-16 NOTE — Progress Notes (Signed)
PT Cancellation Note  Patient Details Name: Kristina Owens MRN: [id number] DOB: 1966-06-11   Cancelled Treatment:     Hold PT today due to Left lower extremity catheter directed mechanical thrombectomy with PTA of common femoral vein to 12 mm performed today. With post procedure orders to begin ambulation tomorrow ( 01/17/2021).    Marella Bile 01/16/2021, 12:29 PM Clois Dupes, PT, MPT Acute Rehabilitation Services Office: [phone] Pager: [phone] 01/16/2021

## 2021-01-16 NOTE — Progress Notes (Signed)
PROGRESS NOTE    Kristina Owens  BOF:[id number] DOB: 04-09-1966 DOA: 01/08/2021 PCP: Haywood Filler., FNP   Brief Narrative: 55 year old with past medical history significant for anxiety, depression, GERD, bipolar, polysubstance abuse, alcohol abuse presented to ED with 2 weeks history of left leg pain.  Patient mostly recently used crack cocaine and methamphetamine and alcohol today prior to admission.  Patient was found to have elevated D-dimer at 1.3, bilateral lower extremity duplex: Positive for acute DVT involving left common femoral, left femoral, popliteal, posterior tibial, peroneal, soleal and gastrocnemius veins.  CTA chest showed multiple bilateral PE including saddle embolus with CT evidence of right heart strain. Patient was admitted for DVT and PE started on Lovenox.  She was subsequently transition to Eliquis.  She is spiking fever, and treated with IV antibiotics for UTI.    Assessment & Plan:   Principal Problem:   Bipolar disorder, in full remission, most recent episode depressed ([place]) Active Problems:   Generalized anxiety disorder   Alcohol use disorder, severe, in early remission, dependence ([place])   Pulmonary embolism ([place])   DVT (deep venous thrombosis) ([facility])   Polysubstance (excluding opioids) dependence ([place])   Alcohol abuse with withdrawal ([place])   1-Acute unprovoked Left Lower extremity DVT/acute saddle PE with right heart strain by CT: -Patient was initially on Lovenox.  Subsequently transition to Eliquis 6/09.  -CTA;  Shows multiple bilateral PE including saddle embolus, and the bifurcation of the right pulmonary artery, saddle embolus at the bifurcation of right middle and right lower lobe pulmonary arteries and within bilateral lower lobes and right middle lobe. -Doppler lower extremity: Acute deep vein thrombosis involving the left common femoral vein, left femoral vein, left popliteal vein, left posterior tibial veins, left peroneal veins, left  soleal veins and left gastrocnemius veins. -New onset chest pain  pleuritic on 6/14.  Suspect related to atelectasis/PNA  Patient vitals has remained stable.  Oxygen saturation 97 on room air, blood pressure in the normal range.  Troponins x2 negative. -ECHO pending.  -Worsening lower extremity edema 6/14, IR reconsulted for evaluation, had  CT venogram abdomen and pelvis.  -incentive spirometry.  -Underwent catheter directed mechanical thrombectomy with TPA of common femoral vein by IR 6/15. -Continue with eliquis.   2-Sepsis, Secondary to UTI/pneumonia: Met sepsis criteria with leukocytosis, tachycardia, tachypnea. Treated initially with ceftriaxone and azithromycin. Now on ceftriaxone. Complete 7 days tx  3-polysubstance abuse: Counseling provided  Alcohol abuse, suspected alcohol withdrawal: She was monitored on CIWA protocol  Depression disorder, bipolar: Medication resume Lamictal BuSpar Zoloft and Abilify  Tobacco use counseling provided. .  Hypokalemia; replete orally.    Estimated body mass index is 26.57 kg/m as calculated from the following:   Height as of this encounter: 5' 3"  (1.6 m).   Weight as of this encounter: 68 kg.   DVT prophylaxis: Eliquis,  Code Status: Full code Family Communication: care discussed with patient.  Disposition Plan:  Status is: Inpatient  Remains inpatient appropriate because:Ongoing active pain requiring inpatient pain management  Dispo: The patient is from: homeless               Anticipated d/c is to: plan for group home on Friday              Patient currently is not medically stable to d/c.   Difficult to place patient No        Consultants:  IR  Procedures:  ECHO pending  Antimicrobials:    Subjective: She  will be able to go to group home center for substance abuse rehab. She is complaining of leg pain. Denies worsening dyspnea.   Objective: Vitals:   01/16/21 1220 01/16/21 1312 01/16/21 1348 01/16/21 1420   BP: 117/65 96/61 98/60  106/62  Pulse: 75 64 66 70  Resp: 18 19 18 18   Temp: 98.2 F (36.8 C) 97.9 F (36.6 C) 98.4 F (36.9 C) 98.2 F (36.8 C)  TempSrc: Oral Oral Oral Oral  SpO2: 95% 94% 94% 94%  Weight:      Height:        Intake/Output Summary (Last 24 hours) at 01/16/2021 1600 Last data filed at 01/16/2021 0845 Gross per 24 hour  Intake 120 ml  Output 1000 ml  Net -880 ml    Filed Weights   01/08/21 1207  Weight: 68 kg    Examination:  General exam: NAD Respiratory system: CTA Cardiovascular system: S 1 S 2 RRR Gastrointestinal system: BS present, soft, nt Central nervous system: alert Extremities: ft LE edema   Data Reviewed: I have personally reviewed following labs and imaging studies  CBC: Recent Labs  Lab 01/11/21 0454 01/12/21 0533 01/13/21 0543 01/15/21 0446 01/16/21 0505  WBC 12.0* 8.4 6.1 5.4 4.7  NEUTROABS  --   --   --   --  2.2  HGB 14.3 12.5 11.7* 12.0 12.5  HCT 44.1 38.7 36.3 38.2 39.5  MCV 93.6 93.0 94.3 95.0 94.5  PLT 201 219 257 390 436*    Basic Metabolic Panel: Recent Labs  Lab 01/11/21 0454 01/12/21 0533 01/13/21 0543 01/14/21 0458 01/15/21 0446 01/16/21 0505  NA 130* 133* 135  --  140 138  K 4.1 4.1 3.4* 3.5 3.5 3.4*  CL 98 102 105  --  107 107  CO2 22 23 23   --  24 24  GLUCOSE 107* 83 83  --  74 83  BUN 10 8 6   --  <5* <5*  CREATININE 0.61 0.39* 0.42*  --  0.49 0.47  CALCIUM 8.2* 8.1* 7.8*  --  8.4* 8.2*  MG  --  1.9  --   --  1.9  --   PHOS  --  2.4*  --   --  3.5  --     GFR: Estimated Creatinine Clearance: 75.2 mL/min (by C-G formula based on SCr of 0.47 mg/dL). Liver Function Tests: No results for input(s): AST, ALT, ALKPHOS, BILITOT, PROT, ALBUMIN in the last 168 hours. No results for input(s): LIPASE, AMYLASE in the last 168 hours. No results for input(s): AMMONIA in the last 168 hours. Coagulation Profile: Recent Labs  Lab 01/16/21 0505  INR 1.2   Cardiac Enzymes: No results for input(s):  CKTOTAL, CKMB, CKMBINDEX, TROPONINI in the last 168 hours. BNP (last 3 results) No results for input(s): PROBNP in the last 8760 hours. HbA1C: No results for input(s): HGBA1C in the last 72 hours. CBG: No results for input(s): GLUCAP in the last 168 hours. Lipid Profile: No results for input(s): CHOL, HDL, LDLCALC, TRIG, CHOLHDL, LDLDIRECT in the last 72 hours. Thyroid Function Tests: No results for input(s): TSH, T4TOTAL, FREET4, T3FREE, THYROIDAB in the last 72 hours. Anemia Panel: No results for input(s): VITAMINB12, FOLATE, FERRITIN, TIBC, IRON, RETICCTPCT in the last 72 hours. Sepsis Labs: Recent Labs  Lab 01/11/21 1254 01/11/21 1537  PROCALCITON <0.10  --   LATICACIDVEN 0.6 0.7     Recent Results (from the past 240 hour(s))  Resp Panel by RT-PCR (Flu A&B, Covid) Nasopharyngeal Swab  Status: None   Collection Time: 01/08/21 12:20 PM   Specimen: Nasopharyngeal Swab; Nasopharyngeal(NP) swabs in vial transport medium  Result Value Ref Range Status   SARS Coronavirus 2 by RT PCR NEGATIVE NEGATIVE Final    Comment: (NOTE) SARS-CoV-2 target nucleic acids are NOT DETECTED.  The SARS-CoV-2 RNA is generally detectable in upper respiratory specimens during the acute phase of infection. The lowest concentration of SARS-CoV-2 viral copies this assay can detect is 138 copies/mL. A negative result does not preclude SARS-Cov-2 infection and should not be used as the sole basis for treatment or other patient management decisions. A negative result may occur with  improper specimen collection/handling, submission of specimen other than nasopharyngeal swab, presence of viral mutation(s) within the areas targeted by this assay, and inadequate number of viral copies(<138 copies/mL). A negative result must be combined with clinical observations, patient history, and epidemiological information. The expected result is Negative.  Fact Sheet for Patients:   EntrepreneurPulse.com.au  Fact Sheet for Healthcare Providers:  IncredibleEmployment.be  This test is no t yet approved or cleared by the [place] FDA and  has been authorized for detection and/or diagnosis of SARS-CoV-2 by FDA under an Emergency Use Authorization (EUA). This EUA will remain  in effect (meaning this test can be used) for the duration of the COVID-19 declaration under Section 564(b)(1) of the Act, 21 U.S.C.section 360bbb-3(b)(1), unless the authorization is terminated  or revoked sooner.       Influenza A by PCR NEGATIVE NEGATIVE Final   Influenza B by PCR NEGATIVE NEGATIVE Final    Comment: (NOTE) The Xpert Xpress SARS-CoV-2/FLU/RSV plus assay is intended as an aid in the diagnosis of influenza from Nasopharyngeal swab specimens and should not be used as a sole basis for treatment. Nasal washings and aspirates are unacceptable for Xpert Xpress SARS-CoV-2/FLU/RSV testing.  Fact Sheet for Patients: EntrepreneurPulse.com.au  Fact Sheet for Healthcare Providers: IncredibleEmployment.be  This test is not yet approved or cleared by the [place] FDA and has been authorized for detection and/or diagnosis of SARS-CoV-2 by FDA under an Emergency Use Authorization (EUA). This EUA will remain in effect (meaning this test can be used) for the duration of the COVID-19 declaration under Section 564(b)(1) of the Act, 21 U.S.C. section 360bbb-3(b)(1), unless the authorization is terminated or revoked.  Performed at [facility], [place] [address] [place], [place] [address]   Culture, blood (routine x 2)     Status: None   Collection Time: 01/11/21 11:22 AM   Specimen: BLOOD  Result Value Ref Range Status   Specimen Description   Final    BLOOD RIGHT ANTECUBITAL Performed at [facility], [place] [address], [place], [place] [address]    Special Requests   Final     BOTTLES DRAWN AEROBIC AND ANAEROBIC Blood Culture adequate volume Performed at [facility] [address], [place], [place] [address]    Culture   Final    NO GROWTH 5 DAYS Performed at [facility], [place] [address], [place], [place] [address]    Report Status 01/16/2021 FINAL  Final  Culture, blood (routine x 2)     Status: None   Collection Time: 01/11/21 11:22 AM   Specimen: BLOOD  Result Value Ref Range Status   Specimen Description   Final    BLOOD LEFT ANTECUBITAL Performed at [facility], [place] [address] [place],  [address]    Special Requests   Final  BOTTLES DRAWN AEROBIC AND ANAEROBIC Blood Culture adequate volume Performed at [place] [address] [place], [place] [address]    Culture   Final    NO GROWTH 5 DAYS Performed at [facility], [place] [address] [place], [place] [address]    Report Status 01/16/2021 FINAL  Final  Culture, Urine     Status: Abnormal   Collection Time: 01/11/21 11:40 AM   Specimen: Urine, Clean Catch  Result Value Ref Range Status   Specimen Description   Final    URINE, CLEAN CATCH Performed at [facility], [place] [address] [place], [place] [address]    Special Requests   Final    NONE Performed at [facility], [place] [address] [place], [place] [address]    Culture >=100,000 COLONIES/mL ESCHERICHIA COLI (A)  Final   Report Status 01/14/2021 FINAL  Final   Organism ID, Bacteria ESCHERICHIA COLI (A)  Final      Susceptibility   Escherichia coli - MIC*    AMPICILLIN 8 SENSITIVE Sensitive     CEFAZOLIN <=4 SENSITIVE Sensitive     CEFEPIME <=0.12 SENSITIVE Sensitive     CEFTRIAXONE <=0.25 SENSITIVE Sensitive     CIPROFLOXACIN <=0.25 SENSITIVE Sensitive     GENTAMICIN <=1 SENSITIVE Sensitive     IMIPENEM <=0.25 SENSITIVE Sensitive     NITROFURANTOIN <=16 SENSITIVE Sensitive     TRIMETH/SULFA <=20 SENSITIVE Sensitive      AMPICILLIN/SULBACTAM 4 SENSITIVE Sensitive     PIP/TAZO <=4 SENSITIVE Sensitive     * >=100,000 COLONIES/mL ESCHERICHIA COLI          Radiology Studies: IR Veno/Ext/Uni Left  Result Date: 01/16/2021 INDICATION: 55 year old female with acute left lower extremity femoropopliteal and calf vein DVT. She has been appropriately anticoagulated but continues to have progressive left lower extremity pain and swelling. Therefore, she is a candidate for percutaneous catheter directed thrombectomy. EXAM: THROMBOECTOMY MECHANICAL VENOUS; LEFT EXTREMITY VENOGRAPHY; IR ULTRASOUND GUIDANCE VASC ACCESS LEFT; IR PTA VENOUS EXCEPT DIALYSIS CIRCUIT; INFERIOR VENA CAVOGRAM COMPARISON:  CT venogram 01/15/2021 MEDICATIONS: 25 mg Benadryl, 5000 units heparin, 75 mL Omnipaque 300 ANESTHESIA/SEDATION: Versed 6 mg IV; Fentanyl 300s mcg IV Moderate Sedation Time:  86 minutes The patient was continuously monitored during the procedure by the interventional radiology nurse under my direct supervision. FLUOROSCOPY TIME: Fluoroscopy Time: 12 minutes 42 seconds (113 mGy). COMPLICATIONS: None immediate. TECHNIQUE: Informed written consent was obtained from the patient after a thorough discussion of the procedural risks, benefits and alternatives. All questions were addressed. Maximal Sterile Barrier Technique was utilized including caps, mask, sterile gowns, sterile gloves, sterile drape, hand hygiene and skin antiseptic. A timeout was performed prior to the initiation of the procedure. The popliteal vein was interrogated with ultrasound and found to be completely thrombosed. An image was obtained and stored for the medical record. Local anesthesia was attained by infiltration with 1% lidocaine. A small dermatotomy was made. Under real-time sonographic guidance, the vessel was punctured with a 21 gauge micropuncture needle. Using standard technique, the initial micro needle was exchanged over a 0.018 micro wire for a transitional 4  [place] micro sheath. The micro sheath was then exchanged over a 0.035 wire for a 6 French vascular sheath. A 100 cm angled vert catheter and Glidewire were then used to advanced into the left external iliac vein. Venography was performed. Patent external iliac vein. Duplicated common iliac vein. The IVC is widely patent. Nonocclusive thrombus is present within the  external iliac vein. A pull-back venogram demonstrates significant occlusive thrombus in the common femoral vein, femoral vein and popliteal vein. Next, the catheter and wire combination were navigated up into the left subclavian vein. The glidewire was then exchanged for a 260 cm superstiff Amplatz wire. Initial angioplasty of the popliteal vein and distal femoral vein was performed utilizing an 8 x 40 mm Conquest balloon. This was performed in order to macerate and soften the thrombus to allow full expansion of the recovery cone of the In[place] sheath device. Following angioplasty of the expected intravenous landing zone, the percutaneous tract was serially dilated to 16 [place]. The 43 [place] recovery cone sheath was then advanced over the wire and positioned in the popliteal vein. The recovery cone was successfully deployed. The [place] ClotTriever device was then advanced over the wire into the common iliac vein. Mechanical thrombectomy with extirpation of acute, subacute and chronic thrombus was then performed throughout the external iliac vein, common femoral, femoral and popliteal veins. A multiple passes were taken with successful extirpation of a large volume of thrombus of varying chronicity. A 55 cm 6 [place] vascular sheath was then advanced coaxially over the wire through the 16 [place] Inari sheath. Left lower extremity venography was again performed. Restoration of patency of the venous system. There is a small amount of residual thrombus as well as chronic narrowing in the common femoral vein. Therefore, the sheath was removed over the wire.  A 12 x 40 mm Conquest balloon was then advanced into the region of stenosis and angioplasty performed. The balloon was inflated to full effacement and maintained for 90 seconds. One final pass with the ClotTriever device was then performed through the area extirpating a small amount of residual chronic thrombus. Final venography a through the sheath demonstrates restored patency and no significant residual stenosis or thrombus. Outflow is slow secondary to a paucity of inflow from the occluded calf veins. The sheath was then removed and hemostasis attained with the assistance of a pursestring suture using 0 silk suture. FINDINGS: 1. Large volume thrombus within the left external iliac, common femoral, femoral and popliteal veins. 2. 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 IMPRESSION: 1. Successful catheter directed mechanical thrombectomy with extirpation of a mix of acute, subacute and chronic thrombus. 2. Successful balloon angioplasty of common femoral venous stenosis to 12 mm. PLAN: 1. Bed rest with left leg straight for 2 hours. 2. Apply Ace bandage compression to left lower extremity. 3. Encourage ambulation beginning this evening or tomorrow morning. 4. Continue anticoagulation. Signed, Criselda Peaches, MD, [place] [facility] Radiology Electronically Signed   By: Jacqulynn Cadet M.D.   On: 01/16/2021 13:16   IR Venocavagram Ivc  Result Date: 01/16/2021 INDICATION: 55 year old female with acute left lower extremity femoropopliteal and calf vein DVT. She has been appropriately anticoagulated but continues to have progressive left lower extremity pain and swelling. Therefore, she is a candidate for percutaneous catheter directed thrombectomy. EXAM: THROMBOECTOMY MECHANICAL VENOUS; LEFT EXTREMITY VENOGRAPHY; IR ULTRASOUND GUIDANCE VASC ACCESS LEFT; IR PTA VENOUS EXCEPT DIALYSIS CIRCUIT; INFERIOR VENA CAVOGRAM COMPARISON:  CT venogram 01/15/2021  MEDICATIONS: 25 mg Benadryl, 5000 units heparin, 75 mL Omnipaque 300 ANESTHESIA/SEDATION: Versed 6 mg IV; Fentanyl 300s mcg IV Moderate Sedation Time:  86 minutes The patient was continuously monitored during the procedure by the interventional radiology nurse under my direct supervision. FLUOROSCOPY TIME: Fluoroscopy Time: 12 minutes 42 seconds (113 mGy). COMPLICATIONS: None immediate. TECHNIQUE: Informed written consent was  obtained from the patient after a thorough discussion of the procedural risks, benefits and alternatives. All questions were addressed. Maximal Sterile Barrier Technique was utilized including caps, mask, sterile gowns, sterile gloves, sterile drape, hand hygiene and skin antiseptic. A timeout was performed prior to the initiation of the procedure. The popliteal vein was interrogated with ultrasound and found to be completely thrombosed. An image was obtained and stored for the medical record. Local anesthesia was attained by infiltration with 1% lidocaine. A small dermatotomy was made. Under real-time sonographic guidance, the vessel was punctured with a 21 gauge micropuncture needle. Using standard technique, the initial micro needle was exchanged over a 0.018 micro wire for a transitional 4 [place] micro sheath. The micro sheath was then exchanged over a 0.035 wire for a 6 French vascular sheath. A 100 cm angled vert catheter and Glidewire were then used to advanced into the left external iliac vein. Venography was performed. Patent external iliac vein. Duplicated common iliac vein. The IVC is widely patent. Nonocclusive thrombus is present within the external iliac vein. A pull-back venogram demonstrates significant occlusive thrombus in the common femoral vein, femoral vein and popliteal vein. Next, the catheter and wire combination were navigated up into the left subclavian vein. The glidewire was then exchanged for a 260 cm superstiff Amplatz wire. Initial angioplasty of the popliteal  vein and distal femoral vein was performed utilizing an 8 x 40 mm Conquest balloon. This was performed in order to macerate and soften the thrombus to allow full expansion of the recovery cone of the Inari sheath device. Following angioplasty of the expected intravenous landing zone, the percutaneous tract was serially dilated to 16 [place]. The 79 [place] recovery cone sheath was then advanced over the wire and positioned in the popliteal vein. The recovery cone was successfully deployed. The Inari ClotTriever device was then advanced over the wire into the common iliac vein. Mechanical thrombectomy with extirpation of acute, subacute and chronic thrombus was then performed throughout the external iliac vein, common femoral, femoral and popliteal veins. A multiple passes were taken with successful extirpation of a large volume of thrombus of varying chronicity. A 55 cm 6 [place] vascular sheath was then advanced coaxially over the wire through the 16 [place] Inari sheath. Left lower extremity venography was again performed. Restoration of patency of the venous system. There is a small amount of residual thrombus as well as chronic narrowing in the common femoral vein. Therefore, the sheath was removed over the wire. A 12 x 40 mm Conquest balloon was then advanced into the region of stenosis and angioplasty performed. The balloon was inflated to full effacement and maintained for 90 seconds. One final pass with the ClotTriever device was then performed through the area extirpating a small amount of residual chronic thrombus. Final venography a through the sheath demonstrates restored patency and no significant residual stenosis or thrombus. Outflow is slow secondary to a paucity of inflow from the occluded calf veins. The sheath was then removed and hemostasis attained with the assistance of a pursestring suture using 0 silk suture. FINDINGS: 1. Large volume thrombus within the left external iliac, common femoral,  femoral and popliteal veins. 2. 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 IMPRESSION: 1. Successful catheter directed mechanical thrombectomy with extirpation of a mix of acute, subacute and chronic thrombus. 2. Successful balloon angioplasty of common femoral venous stenosis to 12 mm. PLAN: 1. Bed rest with left leg straight for 2 hours. 2. Apply Ace bandage compression  to left lower extremity. 3. Encourage ambulation beginning this evening or tomorrow morning. 4. Continue anticoagulation. Signed, Criselda Peaches, MD, [place] [facility] Radiology Electronically Signed   By: Jacqulynn Cadet M.D.   On: 01/16/2021 13:16   IR THROMBECT VENO MECH MOD SED  Result Date: 01/16/2021 INDICATION: 55 year old female with acute left lower extremity femoropopliteal and calf vein DVT. She has been appropriately anticoagulated but continues to have progressive left lower extremity pain and swelling. Therefore, she is a candidate for percutaneous catheter directed thrombectomy. EXAM: THROMBOECTOMY MECHANICAL VENOUS; LEFT EXTREMITY VENOGRAPHY; IR ULTRASOUND GUIDANCE VASC ACCESS LEFT; IR PTA VENOUS EXCEPT DIALYSIS CIRCUIT; INFERIOR VENA CAVOGRAM COMPARISON:  CT venogram 01/15/2021 MEDICATIONS: 25 mg Benadryl, 5000 units heparin, 75 mL Omnipaque 300 ANESTHESIA/SEDATION: Versed 6 mg IV; Fentanyl 300s mcg IV Moderate Sedation Time:  86 minutes The patient was continuously monitored during the procedure by the interventional radiology nurse under my direct supervision. FLUOROSCOPY TIME: Fluoroscopy Time: 12 minutes 42 seconds (113 mGy). COMPLICATIONS: None immediate. TECHNIQUE: Informed written consent was obtained from the patient after a thorough discussion of the procedural risks, benefits and alternatives. All questions were addressed. Maximal Sterile Barrier Technique was utilized including caps, mask, sterile gowns, sterile gloves, sterile drape, hand hygiene and skin  antiseptic. A timeout was performed prior to the initiation of the procedure. The popliteal vein was interrogated with ultrasound and found to be completely thrombosed. An image was obtained and stored for the medical record. Local anesthesia was attained by infiltration with 1% lidocaine. A small dermatotomy was made. Under real-time sonographic guidance, the vessel was punctured with a 21 gauge micropuncture needle. Using standard technique, the initial micro needle was exchanged over a 0.018 micro wire for a transitional 4 [place] micro sheath. The micro sheath was then exchanged over a 0.035 wire for a 6 French vascular sheath. A 100 cm angled vert catheter and Glidewire were then used to advanced into the left external iliac vein. Venography was performed. Patent external iliac vein. Duplicated common iliac vein. The IVC is widely patent. Nonocclusive thrombus is present within the external iliac vein. A pull-back venogram demonstrates significant occlusive thrombus in the common femoral vein, femoral vein and popliteal vein. Next, the catheter and wire combination were navigated up into the left subclavian vein. The glidewire was then exchanged for a 260 cm superstiff Amplatz wire. Initial angioplasty of the popliteal vein and distal femoral vein was performed utilizing an 8 x 40 mm Conquest balloon. This was performed in order to macerate and soften the thrombus to allow full expansion of the recovery cone of the Inari sheath device. Following angioplasty of the expected intravenous landing zone, the percutaneous tract was serially dilated to 16 [place]. The 52 [place] recovery cone sheath was then advanced over the wire and positioned in the popliteal vein. The recovery cone was successfully deployed. The Inari ClotTriever device was then advanced over the wire into the common iliac vein. Mechanical thrombectomy with extirpation of acute, subacute and chronic thrombus was then performed throughout the  external iliac vein, common femoral, femoral and popliteal veins. A multiple passes were taken with successful extirpation of a large volume of thrombus of varying chronicity. A 55 cm 6 [place] vascular sheath was then advanced coaxially over the wire through the 16 [place] Inari sheath. Left lower extremity venography was again performed. Restoration of patency of the venous system. There is a small amount of residual thrombus as well as chronic narrowing in the  common femoral vein. Therefore, the sheath was removed over the wire. A 12 x 40 mm Conquest balloon was then advanced into the region of stenosis and angioplasty performed. The balloon was inflated to full effacement and maintained for 90 seconds. One final pass with the ClotTriever device was then performed through the area extirpating a small amount of residual chronic thrombus. Final venography a through the sheath demonstrates restored patency and no significant residual stenosis or thrombus. Outflow is slow secondary to a paucity of inflow from the occluded calf veins. The sheath was then removed and hemostasis attained with the assistance of a pursestring suture using 0 silk suture. FINDINGS: 1. Large volume thrombus within the left external iliac, common femoral, femoral and popliteal veins. 2. 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 IMPRESSION: 1. Successful catheter directed mechanical thrombectomy with extirpation of a mix of acute, subacute and chronic thrombus. 2. Successful balloon angioplasty of common femoral venous stenosis to 12 mm. PLAN: 1. Bed rest with left leg straight for 2 hours. 2. Apply Ace bandage compression to left lower extremity. 3. Encourage ambulation beginning this evening or tomorrow morning. 4. Continue anticoagulation. Signed, Criselda Peaches, MD, [facility] Radiology Electronically Signed   By: Jacqulynn Cadet M.D.   On: 01/16/2021 13:16   IR US Guide  Vasc Access Left  Result Date: 01/16/2021 INDICATION: 55 year old female with acute left lower extremity femoropopliteal and calf vein DVT. She has been appropriately anticoagulated but continues to have progressive left lower extremity pain and swelling. Therefore, she is a candidate for percutaneous catheter directed thrombectomy. EXAM: THROMBOECTOMY MECHANICAL VENOUS; LEFT EXTREMITY VENOGRAPHY; IR ULTRASOUND GUIDANCE VASC ACCESS LEFT; IR PTA VENOUS EXCEPT DIALYSIS CIRCUIT; INFERIOR VENA CAVOGRAM COMPARISON:  CT venogram 01/15/2021 MEDICATIONS: 25 mg Benadryl, 5000 units heparin, 75 mL Omnipaque 300 ANESTHESIA/SEDATION: Versed 6 mg IV; Fentanyl 300s mcg IV Moderate Sedation Time:  86 minutes The patient was continuously monitored during the procedure by the interventional radiology nurse under my direct supervision. FLUOROSCOPY TIME: Fluoroscopy Time: 12 minutes 42 seconds (113 mGy). COMPLICATIONS: None immediate. TECHNIQUE: Informed written consent was obtained from the patient after a thorough discussion of the procedural risks, benefits and alternatives. All questions were addressed. Maximal Sterile Barrier Technique was utilized including caps, mask, sterile gowns, sterile gloves, sterile drape, hand hygiene and skin antiseptic. A timeout was performed prior to the initiation of the procedure. The popliteal vein was interrogated with ultrasound and found to be completely thrombosed. An image was obtained and stored for the medical record. Local anesthesia was attained by infiltration with 1% lidocaine. A small dermatotomy was made. Under real-time sonographic guidance, the vessel was punctured with a 21 gauge micropuncture needle. Using standard technique, the initial micro needle was exchanged over a 0.018 micro wire for a transitional 4 [place] micro sheath. The micro sheath was then exchanged over a 0.035 wire for a 6 French vascular sheath. A 100 cm angled vert catheter and Glidewire were then used to  advanced into the left external iliac vein. Venography was performed. Patent external iliac vein. Duplicated common iliac vein. The IVC is widely patent. Nonocclusive thrombus is present within the external iliac vein. A pull-back venogram demonstrates significant occlusive thrombus in the common femoral vein, femoral vein and popliteal vein. Next, the catheter and wire combination were navigated up into the left subclavian vein. The glidewire was then exchanged for a 260 cm superstiff Amplatz wire. Initial angioplasty of the popliteal vein and distal femoral  vein was performed utilizing an 8 x 40 mm Conquest balloon. This was performed in order to macerate and soften the thrombus to allow full expansion of the recovery cone of the Inari sheath device. Following angioplasty of the expected intravenous landing zone, the percutaneous tract was serially dilated to 16 [place]. The 4 [place] recovery cone sheath was then advanced over the wire and positioned in the popliteal vein. The recovery cone was successfully deployed. The Inari ClotTriever device was then advanced over the wire into the common iliac vein. Mechanical thrombectomy with extirpation of acute, subacute and chronic thrombus was then performed throughout the external iliac vein, common femoral, femoral and popliteal veins. A multiple passes were taken with successful extirpation of a large volume of thrombus of varying chronicity. A 55 cm 6 [place] vascular sheath was then advanced coaxially over the wire through the 16 [place] Inari sheath. Left lower extremity venography was again performed. Restoration of patency of the venous system. There is a small amount of residual thrombus as well as chronic narrowing in the common femoral vein. Therefore, the sheath was removed over the wire. A 12 x 40 mm Conquest balloon was then advanced into the region of stenosis and angioplasty performed. The balloon was inflated to full effacement and maintained for 90  seconds. One final pass with the ClotTriever device was then performed through the area extirpating a small amount of residual chronic thrombus. Final venography a through the sheath demonstrates restored patency and no significant residual stenosis or thrombus. Outflow is slow secondary to a paucity of inflow from the occluded calf veins. The sheath was then removed and hemostasis attained with the assistance of a pursestring suture using 0 silk suture. FINDINGS: 1. Large volume thrombus within the left external iliac, common femoral, femoral and popliteal veins. 2. 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 IMPRESSION: 1. Successful catheter directed mechanical thrombectomy with extirpation of a mix of acute, subacute and chronic thrombus. 2. Successful balloon angioplasty of common femoral venous stenosis to 12 mm. PLAN: 1. Bed rest with left leg straight for 2 hours. 2. Apply Ace bandage compression to left lower extremity. 3. Encourage ambulation beginning this evening or tomorrow morning. 4. Continue anticoagulation. Signed, Criselda Peaches, MD, [place] [facility] Radiology Electronically Signed   By: Jacqulynn Cadet M.D.   On: 01/16/2021 13:16   IR PTA VENOUS EXCEPT DIALYSIS CIRCUIT  Result Date: 01/16/2021 INDICATION: 55 year old female with acute left lower extremity femoropopliteal and calf vein DVT. She has been appropriately anticoagulated but continues to have progressive left lower extremity pain and swelling. Therefore, she is a candidate for percutaneous catheter directed thrombectomy. EXAM: THROMBOECTOMY MECHANICAL VENOUS; LEFT EXTREMITY VENOGRAPHY; IR ULTRASOUND GUIDANCE VASC ACCESS LEFT; IR PTA VENOUS EXCEPT DIALYSIS CIRCUIT; INFERIOR VENA CAVOGRAM COMPARISON:  CT venogram 01/15/2021 MEDICATIONS: 25 mg Benadryl, 5000 units heparin, 75 mL Omnipaque 300 ANESTHESIA/SEDATION: Versed 6 mg IV; Fentanyl 300s mcg IV Moderate Sedation Time:   86 minutes The patient was continuously monitored during the procedure by the interventional radiology nurse under my direct supervision. FLUOROSCOPY TIME: Fluoroscopy Time: 12 minutes 42 seconds (113 mGy). COMPLICATIONS: None immediate. TECHNIQUE: Informed written consent was obtained from the patient after a thorough discussion of the procedural risks, benefits and alternatives. All questions were addressed. Maximal Sterile Barrier Technique was utilized including caps, mask, sterile gowns, sterile gloves, sterile drape, hand hygiene and skin antiseptic. A timeout was performed prior to the initiation of the procedure. The popliteal  vein was interrogated with ultrasound and found to be completely thrombosed. An image was obtained and stored for the medical record. Local anesthesia was attained by infiltration with 1% lidocaine. A small dermatotomy was made. Under real-time sonographic guidance, the vessel was punctured with a 21 gauge micropuncture needle. Using standard technique, the initial micro needle was exchanged over a 0.018 micro wire for a transitional 4 [place] micro sheath. The micro sheath was then exchanged over a 0.035 wire for a 6 French vascular sheath. A 100 cm angled vert catheter and Glidewire were then used to advanced into the left external iliac vein. Venography was performed. Patent external iliac vein. Duplicated common iliac vein. The IVC is widely patent. Nonocclusive thrombus is present within the external iliac vein. A pull-back venogram demonstrates significant occlusive thrombus in the common femoral vein, femoral vein and popliteal vein. Next, the catheter and wire combination were navigated up into the left subclavian vein. The glidewire was then exchanged for a 260 cm superstiff Amplatz wire. Initial angioplasty of the popliteal vein and distal femoral vein was performed utilizing an 8 x 40 mm Conquest balloon. This was performed in order to macerate and soften the thrombus to  allow full expansion of the recovery cone of the Inari sheath device. Following angioplasty of the expected intravenous landing zone, the percutaneous tract was serially dilated to 16 [place]. The 84 [place] recovery cone sheath was then advanced over the wire and positioned in the popliteal vein. The recovery cone was successfully deployed. The Inari ClotTriever device was then advanced over the wire into the common iliac vein. Mechanical thrombectomy with extirpation of acute, subacute and chronic thrombus was then performed throughout the external iliac vein, common femoral, femoral and popliteal veins. A multiple passes were taken with successful extirpation of a large volume of thrombus of varying chronicity. A 55 cm 6 [place] vascular sheath was then advanced coaxially over the wire through the 16 [place] Inari sheath. Left lower extremity venography was again performed. Restoration of patency of the venous system. There is a small amount of residual thrombus as well as chronic narrowing in the common femoral vein. Therefore, the sheath was removed over the wire. A 12 x 40 mm Conquest balloon was then advanced into the region of stenosis and angioplasty performed. The balloon was inflated to full effacement and maintained for 90 seconds. One final pass with the ClotTriever device was then performed through the area extirpating a small amount of residual chronic thrombus. Final venography a through the sheath demonstrates restored patency and no significant residual stenosis or thrombus. Outflow is slow secondary to a paucity of inflow from the occluded calf veins. The sheath was then removed and hemostasis attained with the assistance of a pursestring suture using 0 silk suture. FINDINGS: 1. Large volume thrombus within the left external iliac, common femoral, femoral and popliteal veins. 2. 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 IMPRESSION: 1. Successful catheter directed mechanical thrombectomy  with extirpation of a mix of acute, subacute and chronic thrombus. 2. Successful balloon angioplasty of common femoral venous stenosis to 12 mm. PLAN: 1. Bed rest with left leg straight for 2 hours. 2. Apply Ace bandage compression to left lower extremity. 3. Encourage ambulation beginning this evening or tomorrow morning. 4. Continue anticoagulation. Signed, Criselda Peaches, MD, [place] [facility] Radiology Electronically Signed   By: Jacqulynn Cadet M.D.   On: 01/16/2021 13:16   CT VENOGRAM ABD/PEL  Result Date: 01/15/2021  CLINICAL DATA:  55 year old female with pulmonary emboli and acute left lower extremity DVT. She is having persistent and worsening left lower extremity pain and swelling despite being on anticoagulation. Evaluate for evidence of iliac vein compression syndrome. EXAM: CT VENOGRAM ABDOMEN AND PELVIS TECHNIQUE: CT of the abdomen and pelvis obtained following administration of intravenous contrast. Vascular timing optimized for venous phase imaging. CONTRAST:  147m OMNIPAQUE IOHEXOL 350 MG/ML SOLN COMPARISON:  CT abdomen/pelvis 07/05/2019 FINDINGS: VASCULAR IVC: Widely patent IVC. No evidence of IVC duplication or other congenital anomaly. Hepatic/portal veins: Widely patent. Renal veins: Widely patent.  Normal anatomy. Visceral veins: IMV and SMV are widely patent and unremarkable. Iliac veins: Bilateral common, internal and external iliac veins are widely patent. No evidence of iliac vein compression physiology. Femoral veins: Marked expansion of the left common femoral vein with central filling defect consistent with DVT. DVT extends into the profunda and femoral vein as well as just into the saphenofemoral junction. There is associated inflammatory stranding within the perivenous fat. The right femoral veins are patent and unremarkable. NON VASCULAR Lower chest: Small left pleural effusion with associated left lower lobe atelectasis. No  evidence of pulmonary infarction. Limited visualization of the left lower lobe pulmonary arteries demonstrates some residual linear filling defects consistent with evolving pulmonary emboli. The heart is normal in size. No evidence of right heart strain. Hepatobiliary: Geographic hypoattenuation in the left hemi-liver adjacent to the fissure for the falciform ligament is nonspecific but most suggestive of benign focal fatty infiltration. Normal hepatic contour and morphology. No discrete hepatic lesions. Normal appearance of the gallbladder. No intra or extrahepatic biliary ductal dilatation. Pancreas: No mass or inflammatory changes Spleen: Normal size and morphology. Genitourinary: Normal adrenal glands. No evidence of hydronephrosis, nephrolithiasis or enhancing renal mass. The ureters and kidneys are also unremarkable. The bladder is normal. Bowel: No evidence of obstruction or focal bowel wall thickening. The terminal ileum is unremarkable. Reproductive: Normal uterus and adnexa.  No adnexal mass. Other: No significant ascites or abdominal wall hernia. Edema present in the left upper thigh and inguinal region. Musculoskeletal: No acute fracture or aggressive appearing lytic or blastic osseous lesion. IMPRESSION: 1. No evidence of iliac vein compression physiology or ileo caval DVT. 2. Positive for occlusive DVT in the left common femoral vein, femoral vein, profunda femoral vein and saphenofemoral junction with associated left upper thigh edema. 3. Small left pleural effusion with associated left lower lobe atelectasis. 4. Expected evolution of pulmonary emboli within the visualized pulmonary arteries. No evidence of right heart strain. Signed, HCriselda Peaches MD, [facility] Electronically Signed   By: HJacqulynn CadetM.D.   On: 01/15/2021 16:31        Scheduled Meds:  apixaban  10 mg Oral BID   Followed by   [Derrill MemoON 01/18/2021]  apixaban  5 mg Oral BID   ARIPiprazole  10 mg Oral Daily   busPIRone  10 mg Oral TID   diphenhydrAMINE       fentaNYL       fentaNYL       folic acid  1 mg Oral Daily   heparin sodium (porcine)       lamoTRIgine  25 mg Oral Daily   lidocaine       midazolam       multivitamin with minerals  1 tablet Oral Daily   nicotine  21 mg Transdermal Daily   sertraline  50 mg Oral Daily   sodium  chloride flush  3 mL Intravenous Q12H   thiamine  100 mg Oral Daily   Or   thiamine  100 mg Intravenous Daily   Continuous Infusions:  cefTRIAXone (ROCEPHIN)  IV 2 g (01/16/21 1222)     LOS: 8 days    Time spent: 35 minutes    Jadan Hinojos A Chancey Cullinane, MD [facility]   If 7PM-7AM, please contact night-coverage [email or web address]  01/16/2021, 4:00 PM

## 2021-01-16 NOTE — Sedation Documentation (Signed)
Pt C/O 8/10 left chest pain md aware.

## 2021-01-17 ENCOUNTER — Other Ambulatory Visit: Payer: Self-pay | Admitting: Radiology

## 2021-01-17 DIAGNOSIS — I82402 Acute embolism and thrombosis of unspecified deep veins of left lower extremity: Secondary | ICD-10-CM

## 2021-01-17 LAB — CBC
HCT: 38.4 % (ref 36.0–46.0)
Hemoglobin: 12.2 g/dL (ref 12.0–15.0)
MCH: 29.8 pg (ref 26.0–34.0)
MCHC: 31.8 g/dL (ref 30.0–36.0)
MCV: 93.7 fL (ref 80.0–100.0)
Platelets: 451 10*3/uL — ABNORMAL HIGH (ref 150–400)
RBC: 4.1 MIL/uL (ref 3.87–5.11)
RDW: 15.6 % — ABNORMAL HIGH (ref 11.5–15.5)
WBC: 6.4 10*3/uL (ref 4.0–10.5)
nRBC: 0 % (ref 0.0–0.2)

## 2021-01-17 LAB — BASIC METABOLIC PANEL
Anion gap: 6 (ref 5–15)
BUN: 5 mg/dL — ABNORMAL LOW (ref 6–20)
CO2: 28 mmol/L (ref 22–32)
Calcium: 8.5 mg/dL — ABNORMAL LOW (ref 8.9–10.3)
Chloride: 103 mmol/L (ref 98–111)
Creatinine, Ser: 0.57 mg/dL (ref 0.44–1.00)
GFR, Estimated: >60 mL/min (ref >60)
Glucose, Bld: 94 mg/dL (ref 70–99)
Potassium: 4.3 mmol/L (ref 3.5–5.1)
Sodium: 137 mmol/L (ref 135–145)

## 2021-01-17 NOTE — Progress Notes (Signed)
Physical Therapy Treatment Patient Details Name: Kristina Owens MRN: [id number] DOB: 1966-07-26 Today's Date: 01/17/2021    History of Present Illness 55 year old female who presents with L leg pain for 2 weeks; also SOB without chest pain, chronic cough. Bilateral LE Doppler - Positive LLE acute DVT involving the left common femoral, left femoral, popliteal, posterior tibial, peroneal, soleal and gastrocnemius veins. CTA chest - multiple bilateral PE including saddle embolus with CT evidence of right heart strain. PMH: anxiety, depression, GERD, bipolar, polysubstance abuse, alcohol abuse    PT Comments    Pt ambulates 110 ft with RW, reports LLE "tired", takes short choppy steps without LOB. Pt pleasant, excited to d/c tomorrow and encouraged by ambulation tolerance this session. Will continue acute PT as able.    Follow Up Recommendations  No PT follow up     Equipment Recommendations  None recommended by PT    Recommendations for Other Services       Precautions / Restrictions Precautions Precautions: Fall Restrictions Weight Bearing Restrictions: No    Mobility  Bed Mobility Overal bed mobility: Modified Independent  General bed mobility comments: slightly increased time    Transfers Overall transfer level: Needs assistance Equipment used: None Transfers: Sit to/from Stand Sit to Stand: Supervision  General transfer comment: supv for safety, BUE assisting to power up  Ambulation/Gait Ambulation/Gait assistance: Supervision Gait Distance (Feet): 110 Feet Assistive device: None Gait Pattern/deviations: Step-to pattern;Decreased stride length;Decreased weight shift to left Gait velocity: decreased   General Gait Details: short, choppy steps with decreased L weight-shift, no LOB, denies chest pain and reports LLE feels "tired"   Stairs             Wheelchair Mobility    Modified Rankin (Stroke Patients Only)       Balance Overall balance  assessment: Mild deficits observed, not formally tested     Cognition Arousal/Alertness: Awake/alert Behavior During Therapy: WFL for tasks assessed/performed Overall Cognitive Status: Within Functional Limits for tasks assessed       Exercises      General Comments        Pertinent Vitals/Pain Pain Assessment: Faces Faces Pain Scale: Hurts a little bit Pain Location: L thigh with ambulation Pain Descriptors / Indicators:  ("it's just tired") Pain Intervention(s): Limited activity within patient's tolerance;Monitored during session    Home Living                      Prior Function            PT Goals (current goals can now be found in the care plan section) Acute Rehab PT Goals Patient Stated Goal: less pain PT Goal Formulation: With patient Time For Goal Achievement: 01/24/21 Potential to Achieve Goals: Good Progress towards PT goals: Progressing toward goals    Frequency    Min 3X/week      PT Plan Current plan remains appropriate    Co-evaluation              AM-PAC PT "6 Clicks" Mobility   Outcome Measure  Help needed turning from your back to your side while in a flat bed without using bedrails?: None Help needed moving from lying on your back to sitting on the side of a flat bed without using bedrails?: None Help needed moving to and from a bed to a chair (including a wheelchair)?: A Little Help needed standing up from a chair using your arms (e.g., wheelchair or bedside chair)?: A  Little Help needed to walk in hospital room?: A Little Help needed climbing 3-5 steps with a railing? : A Little 6 Click Score: 20    End of Session Equipment Utilized During Treatment: Gait belt Activity Tolerance: Patient tolerated treatment well Patient left: in bed;with call bell/phone within reach;with bed alarm set Nurse Communication: Mobility status;Other (comment) (LLE "tired") PT Visit Diagnosis: Other abnormalities of gait and mobility  (R26.89)     Time: 0277-4128 PT Time Calculation (min) (ACUTE ONLY): 9 min  Charges:  $Gait Training: 8-22 mins                      Tori Olegario Emberson PT, DPT 01/17/21, 1:26 PM

## 2021-01-17 NOTE — TOC Progression Note (Signed)
Transition of Care [facility]) - Progression Note    Patient Details  Name: Kristina Owens MRN: [id number] Date of Birth: 1965/12/22  [facility] CM/SW Contact  Mykai Wendorf, Olegario Messier, RN Phone Number: 01/17/2021, 8:42 AM  Clinical Narrative:  Confirmed w/patient & [facility]-rep Reese(President) patient will be going their for Inpt rehab for sobriety on Friday.[facility] program for eliquis(patient has $3). Patient's pharmacy-[facility] [place]. Patient has own transportation. MD aware.    Expected Discharge Plan: IP Rehab Facility Barriers to Discharge: Continued Medical Work up  Expected Discharge Plan and Services Expected Discharge Plan: IP Rehab Facility   Discharge Planning Services: CM Consult   Living arrangements for the past 2 months: Apartment                                       Social Determinants of Health (SDOH) Interventions    Readmission Risk Interventions No flowsheet data found.

## 2021-01-17 NOTE — Progress Notes (Signed)
PROGRESS NOTE    Kristina Owens  WNU:[id number] DOB: [birth year] DOA: 01/08/2021 PCP: Haywood Filler., FNP   Brief Narrative: 55 year old with past medical history significant for anxiety, depression, GERD, bipolar, polysubstance abuse, alcohol abuse presented to ED with 2 weeks history of left leg pain.  Patient mostly recently used crack cocaine and methamphetamine and alcohol today prior to admission.  Patient was found to have elevated D-dimer at 1.3, bilateral lower extremity duplex: Positive for acute DVT involving left common femoral, left femoral, popliteal, posterior tibial, peroneal, soleal and gastrocnemius veins.  CTA chest showed multiple bilateral PE including saddle embolus with CT evidence of right heart strain. Patient was admitted for DVT and PE started on Lovenox.  She was subsequently transition to Eliquis.  She is spiking fever, and treated with IV antibiotics for UTI.    Assessment & Plan:   Principal Problem:   Bipolar disorder, in full remission, most recent episode depressed ([place]) Active Problems:   Generalized anxiety disorder   Alcohol use disorder, severe, in early remission, dependence ([place])   Pulmonary embolism ([place])   DVT (deep venous thrombosis) ([facility])   Polysubstance (excluding opioids) dependence ([place])   Alcohol abuse with withdrawal ([place])   1-Acute unprovoked Left Lower extremity DVT/acute saddle PE with right heart strain by CT: -Patient was initially on Lovenox.  Subsequently transition to Eliquis 6/09.  -CTA;  Shows multiple bilateral PE including saddle embolus, and the bifurcation of the right pulmonary artery, saddle embolus at the bifurcation of right middle and right lower lobe pulmonary arteries and within bilateral lower lobes and right middle lobe. -Doppler lower extremity: Acute deep vein thrombosis involving the left common femoral vein, left femoral vein, left popliteal vein, left posterior tibial veins, left peroneal veins, left  soleal veins and left gastrocnemius veins. -New onset chest pain  pleuritic on 6/14.  Suspect related to atelectasis/PNA  Patient vitals has remained stable.  Oxygen saturation 97 on room air, blood pressure in the normal range.  Troponins x2 negative. -ECHO pending.  -Worsening lower extremity edema 6/14, IR reconsulted for evaluation, had  CT venogram abdomen and pelvis.  -incentive spirometry.  -Underwent catheter directed mechanical thrombectomy with TPA of common femoral vein by IR 6/15. -Continue with eliquis.  -Stable. Plan to discharge tomorrow to [facility].   2-Sepsis, Secondary to UTI/pneumonia: Met sepsis criteria with leukocytosis, tachycardia, tachypnea. Treated initially with ceftriaxone and azithromycin. Received ceftriaxone. Completed 7 days tx  3-polysubstance abuse: Counseling provided  Alcohol abuse, suspected alcohol withdrawal: She was monitored on CIWA protocol  Depression disorder, bipolar: Medication resume Lamictal BuSpar Zoloft and Abilify  Tobacco use counseling provided. .  Hypokalemia; replete orally.    Estimated body mass index is 26.57 kg/m as calculated from the following:   Height as of this encounter: _0  (1.6 m).   Weight as of this encounter: 68 kg.   DVT prophylaxis: Eliquis,  Code Status: Full code Family Communication: care discussed with patient.  Disposition Plan:  Status is: Inpatient  Remains inpatient appropriate because:Ongoing active pain requiring inpatient pain management  Dispo: The patient is from: homeless               Anticipated d/c is to: plan for group home on Friday              Patient currently is not medically stable to d/c.   Difficult to place patient No        Consultants:  IR  Procedures:  ECHO  pending  Antimicrobials:    Subjective: LE edema improved. Denies worsening dyspnea.   Objective: Vitals:   01/16/21 2156 01/17/21 0541 01/17/21 1309 01/17/21 1319  BP: 119/64 100/67 (!)  82/54 100/79  Pulse: 66 61 (!) 55 76  Resp:  16 18   Temp: 98.4 F (36.9 C) 98.2 F (36.8 C) 98.2 F (36.8 C)   TempSrc: Oral Oral Oral   SpO2: 99% 90% 97%   Weight:      Height:        Intake/Output Summary (Last 24 hours) at 01/17/2021 1527 Last data filed at 01/17/2021 0830 Gross per 24 hour  Intake 440 ml  Output 400 ml  Net 40 ml    Filed Weights   01/08/21 1207  Weight: 68 kg    Examination:  General exam: NAD Respiratory system: CTA Cardiovascular system: S 1, S 2 RRR Gastrointestinal system: BS present, soft, nt Central nervous system: Alert Extremities: LE with less edema   Data Reviewed: I have personally reviewed following labs and imaging studies  CBC: Recent Labs  Lab 01/12/21 0533 01/13/21 0543 01/15/21 0446 01/16/21 0505 01/17/21 0816  WBC 8.4 6.1 5.4 4.7 6.4  NEUTROABS  --   --   --  2.2  --   HGB 12.5 11.7* 12.0 12.5 12.2  HCT 38.7 36.3 38.2 39.5 38.4  MCV 93.0 94.3 95.0 94.5 93.7  PLT 219 257 390 436* 451*    Basic Metabolic Panel: Recent Labs  Lab 01/12/21 0533 01/13/21 0543 01/14/21 0458 01/15/21 0446 01/16/21 0505 01/17/21 0816  NA 133* 135  --  140 138 137  K 4.1 3.4* 3.5 3.5 3.4* 4.3  CL 102 105  --  107 107 103  CO2 23 23  --  _0 GLUCOSE 83 83  --  74 83 94  BUN 8 6  --  <5* <5* 5*  CREATININE 0.39* 0.42*  --  0.49 0.47 0.57  CALCIUM 8.1* 7.8*  --  8.4* 8.2* 8.5*  MG 1.9  --   --  1.9  --   --   PHOS 2.4*  --   --  3.5  --   --     GFR: Estimated Creatinine Clearance: 75.2 mL/min (by C-G formula based on SCr of 0.57 mg/dL). Liver Function Tests: No results for input(s): AST, ALT, ALKPHOS, BILITOT, PROT, ALBUMIN in the last 168 hours. No results for input(s): LIPASE, AMYLASE in the last 168 hours. No results for input(s): AMMONIA in the last 168 hours. Coagulation Profile: Recent Labs  Lab 01/16/21 0505  INR 1.2    Cardiac Enzymes: No results for input(s): CKTOTAL, CKMB, CKMBINDEX, TROPONINI in the  last 168 hours. BNP (last 3 results) No results for input(s): PROBNP in the last 8760 hours. HbA1C: No results for input(s): HGBA1C in the last 72 hours. CBG: No results for input(s): GLUCAP in the last 168 hours. Lipid Profile: No results for input(s): CHOL, HDL, LDLCALC, TRIG, CHOLHDL, LDLDIRECT in the last 72 hours. Thyroid Function Tests: No results for input(s): TSH, T4TOTAL, FREET4, T3FREE, THYROIDAB in the last 72 hours. Anemia Panel: No results for input(s): VITAMINB12, FOLATE, FERRITIN, TIBC, IRON, RETICCTPCT in the last 72 hours. Sepsis Labs: Recent Labs  Lab 01/11/21 1254 01/11/21 1537  PROCALCITON <0.10  --   LATICACIDVEN 0.6 0.7     Recent Results (from the past 240 hour(s))  Resp Panel by RT-PCR (Flu A&B, Covid) Nasopharyngeal Swab     Status: None  Collection Time: 01/08/21 12:20 PM   Specimen: Nasopharyngeal Swab; Nasopharyngeal(NP) swabs in vial transport medium  Result Value Ref Range Status   SARS Coronavirus 2 by RT PCR NEGATIVE NEGATIVE Final    Comment: (NOTE) SARS-CoV-2 target nucleic acids are NOT DETECTED.  The SARS-CoV-2 RNA is generally detectable in upper respiratory specimens during the acute phase of infection. The lowest concentration of SARS-CoV-2 viral copies this assay can detect is 138 copies/mL. A negative result does not preclude SARS-Cov-2 infection and should not be used as the sole basis for treatment or other patient management decisions. A negative result may occur with  improper specimen collection/handling, submission of specimen other than nasopharyngeal swab, presence of viral mutation(s) within the areas targeted by this assay, and inadequate number of viral copies(<138 copies/mL). A negative result must be combined with clinical observations, patient history, and epidemiological information. The expected result is Negative.  Fact Sheet for Patients:  EntrepreneurPulse.com.au  Fact Sheet for Healthcare  Providers:  IncredibleEmployment.be  This test is no t yet approved or cleared by the [place] FDA and  has been authorized for detection and/or diagnosis of SARS-CoV-2 by FDA under an Emergency Use Authorization (EUA). This EUA will remain  in effect (meaning this test can be used) for the duration of the COVID-19 declaration under Section 564(b)(1) of the Act, 21 U.S.C.section 360bbb-3(b)(1), unless the authorization is terminated  or revoked sooner.       Influenza A by PCR NEGATIVE NEGATIVE Final   Influenza B by PCR NEGATIVE NEGATIVE Final    Comment: (NOTE) The Xpert Xpress SARS-CoV-2/FLU/RSV plus assay is intended as an aid in the diagnosis of influenza from Nasopharyngeal swab specimens and should not be used as a sole basis for treatment. Nasal washings and aspirates are unacceptable for Xpert Xpress SARS-CoV-2/FLU/RSV testing.  Fact Sheet for Patients: EntrepreneurPulse.com.au  Fact Sheet for Healthcare Providers: IncredibleEmployment.be  This test is not yet approved or cleared by the [place] FDA and has been authorized for detection and/or diagnosis of SARS-CoV-2 by FDA under an Emergency Use Authorization (EUA). This EUA will remain in effect (meaning this test can be used) for the duration of the COVID-19 declaration under Section 564(b)(1) of the Act, 21 U.S.C. section 360bbb-3(b)(1), unless the authorization is terminated or revoked.  Performed at [facility], [place] [address] [place], [place] [address]   Culture, blood (routine x 2)     Status: None   Collection Time: 01/11/21 11:22 AM   Specimen: BLOOD  Result Value Ref Range Status   Specimen Description   Final    BLOOD RIGHT ANTECUBITAL Performed at [facility], [place] [address], [place], [place] [address]    Special Requests   Final    BOTTLES DRAWN AEROBIC AND ANAEROBIC Blood Culture adequate volume Performed  at [place] [address], [place], [place] [address]    Culture   Final    NO GROWTH 5 DAYS Performed at [facility], [place] [address], [place], [place] [address]    Report Status 01/16/2021 FINAL  Final  Culture, blood (routine x 2)     Status: None   Collection Time: 01/11/21 11:22 AM   Specimen: BLOOD  Result Value Ref Range Status   Specimen Description   Final    BLOOD LEFT ANTECUBITAL Performed at [facility], [place] [address] [place], [place] [address]    Special Requests   Final    BOTTLES DRAWN  AEROBIC AND ANAEROBIC Blood Culture adequate volume Performed at [place] [address] [place], [place] [address]    Culture   Final    NO GROWTH 5 DAYS Performed at [facility], [place] [address], [place], [place] [address]    Report Status 01/16/2021 FINAL  Final  Culture, Urine     Status: Abnormal   Collection Time: 01/11/21 11:40 AM   Specimen: Urine, Clean Catch  Result Value Ref Range Status   Specimen Description   Final    URINE, CLEAN CATCH Performed at [facility], [place] [address], [place], [place] [address]    Special Requests   Final    NONE Performed at [facility], [place] [address], [place], [place] [address]    Culture >=100,000 COLONIES/mL ESCHERICHIA COLI (A)  Final   Report Status 01/14/2021 FINAL  Final   Organism ID, Bacteria ESCHERICHIA COLI (A)  Final      Susceptibility   Escherichia coli - MIC*    AMPICILLIN 8 SENSITIVE Sensitive     CEFAZOLIN <=4 SENSITIVE Sensitive     CEFEPIME <=0.12 SENSITIVE Sensitive     CEFTRIAXONE <=0.25 SENSITIVE Sensitive     CIPROFLOXACIN <=0.25 SENSITIVE Sensitive     GENTAMICIN <=1 SENSITIVE Sensitive     IMIPENEM <=0.25 SENSITIVE Sensitive     NITROFURANTOIN <=16 SENSITIVE Sensitive     TRIMETH/SULFA <=20 SENSITIVE Sensitive     AMPICILLIN/SULBACTAM 4 SENSITIVE Sensitive     PIP/TAZO <=4 SENSITIVE Sensitive      * >=100,000 COLONIES/mL ESCHERICHIA COLI          Radiology Studies: IR Veno/Ext/Uni Left  Result Date: 01/16/2021 INDICATION: 55 year old female with acute left lower extremity femoropopliteal and calf vein DVT. She has been appropriately anticoagulated but continues to have progressive left lower extremity pain and swelling. Therefore, she is a candidate for percutaneous catheter directed thrombectomy. EXAM: THROMBOECTOMY MECHANICAL VENOUS; LEFT EXTREMITY VENOGRAPHY; IR ULTRASOUND GUIDANCE VASC ACCESS LEFT; IR PTA VENOUS EXCEPT DIALYSIS CIRCUIT; INFERIOR VENA CAVOGRAM COMPARISON:  CT venogram 01/15/2021 MEDICATIONS: 25 mg Benadryl, 5000 units heparin, 75 mL Omnipaque 300 ANESTHESIA/SEDATION: Versed 6 mg IV; Fentanyl 300s mcg IV Moderate Sedation Time:  86 minutes The patient was continuously monitored during the procedure by the interventional radiology nurse under my direct supervision. FLUOROSCOPY TIME: Fluoroscopy Time: 12 minutes 42 seconds (113 mGy). COMPLICATIONS: None immediate. TECHNIQUE: Informed written consent was obtained from the patient after a thorough discussion of the procedural risks, benefits and alternatives. All questions were addressed. Maximal Sterile Barrier Technique was utilized including caps, mask, sterile gowns, sterile gloves, sterile drape, hand hygiene and skin antiseptic. A timeout was performed prior to the initiation of the procedure. The popliteal vein was interrogated with ultrasound and found to be completely thrombosed. An image was obtained and stored for the medical record. Local anesthesia was attained by infiltration with 1% lidocaine. A small dermatotomy was made. Under real-time sonographic guidance, the vessel was punctured with a 21 gauge micropuncture needle. Using standard technique, the initial micro needle was exchanged over a 0.018 micro wire for a transitional 4 [place] micro sheath. The micro sheath was then exchanged over a 0.035 wire for a 6  French vascular sheath. A 100 cm angled vert catheter and Glidewire were then used to advanced into the left external iliac vein. Venography was performed. Patent external iliac vein. Duplicated common iliac vein. The IVC is widely patent. Nonocclusive thrombus is present within the external iliac  vein. A pull-back venogram demonstrates significant occlusive thrombus in the common femoral vein, femoral vein and popliteal vein. Next, the catheter and wire combination were navigated up into the left subclavian vein. The glidewire was then exchanged for a 260 cm superstiff Amplatz wire. Initial angioplasty of the popliteal vein and distal femoral vein was performed utilizing an 8 x 40 mm Conquest balloon. This was performed in order to macerate and soften the thrombus to allow full expansion of the recovery cone of the Inari sheath device. Following angioplasty of the expected intravenous landing zone, the percutaneous tract was serially dilated to 16 [place]. The 22 [place] recovery cone sheath was then advanced over the wire and positioned in the popliteal vein. The recovery cone was successfully deployed. The [place] ClotTriever device was then advanced over the wire into the common iliac vein. Mechanical thrombectomy with extirpation of acute, subacute and chronic thrombus was then performed throughout the external iliac vein, common femoral, femoral and popliteal veins. A multiple passes were taken with successful extirpation of a large volume of thrombus of varying chronicity. A 55 cm 6 [place] vascular sheath was then advanced coaxially over the wire through the 16 [place] Inari sheath. Left lower extremity venography was again performed. Restoration of patency of the venous system. There is a small amount of residual thrombus as well as chronic narrowing in the common femoral vein. Therefore, the sheath was removed over the wire. A 12 x 40 mm Conquest balloon was then advanced into the region of stenosis and  angioplasty performed. The balloon was inflated to full effacement and maintained for 90 seconds. One final pass with the ClotTriever device was then performed through the area extirpating a small amount of residual chronic thrombus. Final venography a through the sheath demonstrates restored patency and no significant residual stenosis or thrombus. Outflow is slow secondary to a paucity of inflow from the occluded calf veins. The sheath was then removed and hemostasis attained with the assistance of a pursestring suture using 0 silk suture. FINDINGS: 1. Large volume thrombus within the left external iliac, common femoral, femoral and popliteal veins. 2. 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 IMPRESSION: 1. Successful catheter directed mechanical thrombectomy with extirpation of a mix of acute, subacute and chronic thrombus. 2. Successful balloon angioplasty of common femoral venous stenosis to 12 mm. PLAN: 1. Bed rest with left leg straight for 2 hours. 2. Apply Ace bandage compression to left lower extremity. 3. Encourage ambulation beginning this evening or tomorrow morning. 4. Continue anticoagulation. Signed, Criselda Peaches, MD, [facility] Radiology Electronically Signed   By: Jacqulynn Cadet M.D.   On: 01/16/2021 13:16   IR Venocavagram Ivc  Result Date: 01/16/2021 INDICATION: 54 year old female with acute left lower extremity femoropopliteal and calf vein DVT. She has been appropriately anticoagulated but continues to have progressive left lower extremity pain and swelling. Therefore, she is a candidate for percutaneous catheter directed thrombectomy. EXAM: THROMBOECTOMY MECHANICAL VENOUS; LEFT EXTREMITY VENOGRAPHY; IR ULTRASOUND GUIDANCE VASC ACCESS LEFT; IR PTA VENOUS EXCEPT DIALYSIS CIRCUIT; INFERIOR VENA CAVOGRAM COMPARISON:  CT venogram 01/15/2021 MEDICATIONS: 25 mg Benadryl, 5000 units heparin, 75 mL Omnipaque 300  ANESTHESIA/SEDATION: Versed 6 mg IV; Fentanyl 300s mcg IV Moderate Sedation Time:  86 minutes The patient was continuously monitored during the procedure by the interventional radiology nurse under my direct supervision. FLUOROSCOPY TIME: Fluoroscopy Time: 12 minutes 42 seconds (113 mGy). COMPLICATIONS: None immediate. TECHNIQUE: Informed written consent was obtained from  the patient after a thorough discussion of the procedural risks, benefits and alternatives. All questions were addressed. Maximal Sterile Barrier Technique was utilized including caps, mask, sterile gowns, sterile gloves, sterile drape, hand hygiene and skin antiseptic. A timeout was performed prior to the initiation of the procedure. The popliteal vein was interrogated with ultrasound and found to be completely thrombosed. An image was obtained and stored for the medical record. Local anesthesia was attained by infiltration with 1% lidocaine. A small dermatotomy was made. Under real-time sonographic guidance, the vessel was punctured with a 21 gauge micropuncture needle. Using standard technique, the initial micro needle was exchanged over a 0.018 micro wire for a transitional 4 [place] micro sheath. The micro sheath was then exchanged over a 0.035 wire for a 6 French vascular sheath. A 100 cm angled vert catheter and Glidewire were then used to advanced into the left external iliac vein. Venography was performed. Patent external iliac vein. Duplicated common iliac vein. The IVC is widely patent. Nonocclusive thrombus is present within the external iliac vein. A pull-back venogram demonstrates significant occlusive thrombus in the common femoral vein, femoral vein and popliteal vein. Next, the catheter and wire combination were navigated up into the left subclavian vein. The glidewire was then exchanged for a 260 cm superstiff Amplatz wire. Initial angioplasty of the popliteal vein and distal femoral vein was performed utilizing an 8 x 40 mm  Conquest balloon. This was performed in order to macerate and soften the thrombus to allow full expansion of the recovery cone of the Inari sheath device. Following angioplasty of the expected intravenous landing zone, the percutaneous tract was serially dilated to 16 [place]. The 66 [place] recovery cone sheath was then advanced over the wire and positioned in the popliteal vein. The recovery cone was successfully deployed. The [place] ClotTriever device was then advanced over the wire into the common iliac vein. Mechanical thrombectomy with extirpation of acute, subacute and chronic thrombus was then performed throughout the external iliac vein, common femoral, femoral and popliteal veins. A multiple passes were taken with successful extirpation of a large volume of thrombus of varying chronicity. A 55 cm 6 [place] vascular sheath was then advanced coaxially over the wire through the 16 [place] Inari sheath. Left lower extremity venography was again performed. Restoration of patency of the venous system. There is a small amount of residual thrombus as well as chronic narrowing in the common femoral vein. Therefore, the sheath was removed over the wire. A 12 x 40 mm Conquest balloon was then advanced into the region of stenosis and angioplasty performed. The balloon was inflated to full effacement and maintained for 90 seconds. One final pass with the ClotTriever device was then performed through the area extirpating a small amount of residual chronic thrombus. Final venography a through the sheath demonstrates restored patency and no significant residual stenosis or thrombus. Outflow is slow secondary to a paucity of inflow from the occluded calf veins. The sheath was then removed and hemostasis attained with the assistance of a pursestring suture using 0 silk suture. FINDINGS: 1. Large volume thrombus within the left external iliac, common femoral, femoral and popliteal veins. 2. 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 IMPRESSION: 1. Successful catheter directed mechanical thrombectomy with extirpation of a mix of acute, subacute and chronic thrombus. 2. Successful balloon angioplasty of common femoral venous stenosis to 12 mm. PLAN: 1. Bed rest with left leg straight for 2 hours. 2. Apply Ace bandage compression to left  lower extremity. 3. Encourage ambulation beginning this evening or tomorrow morning. 4. Continue anticoagulation. Signed, Criselda Peaches, MD, [place] [facility] [facility] Radiology Electronically Signed   By: Jacqulynn Cadet M.D.   On: 01/16/2021 13:16   IR THROMBECT VENO MECH MOD SED  Result Date: 01/16/2021 INDICATION: 55 year old female with acute left lower extremity femoropopliteal and calf vein DVT. She has been appropriately anticoagulated but continues to have progressive left lower extremity pain and swelling. Therefore, she is a candidate for percutaneous catheter directed thrombectomy. EXAM: THROMBOECTOMY MECHANICAL VENOUS; LEFT EXTREMITY VENOGRAPHY; IR ULTRASOUND GUIDANCE VASC ACCESS LEFT; IR PTA VENOUS EXCEPT DIALYSIS CIRCUIT; INFERIOR VENA CAVOGRAM COMPARISON:  CT venogram 01/15/2021 MEDICATIONS: 25 mg Benadryl, 5000 units heparin, 75 mL Omnipaque 300 ANESTHESIA/SEDATION: Versed 6 mg IV; Fentanyl 300s mcg IV Moderate Sedation Time:  86 minutes The patient was continuously monitored during the procedure by the interventional radiology nurse under my direct supervision. FLUOROSCOPY TIME: Fluoroscopy Time: 12 minutes 42 seconds (113 mGy). COMPLICATIONS: None immediate. TECHNIQUE: Informed written consent was obtained from the patient after a thorough discussion of the procedural risks, benefits and alternatives. All questions were addressed. Maximal Sterile Barrier Technique was utilized including caps, mask, sterile gowns, sterile gloves, sterile drape, hand hygiene and skin antiseptic. A timeout was performed prior to the initiation  of the procedure. The popliteal vein was interrogated with ultrasound and found to be completely thrombosed. An image was obtained and stored for the medical record. Local anesthesia was attained by infiltration with 1% lidocaine. A small dermatotomy was made. Under real-time sonographic guidance, the vessel was punctured with a 21 gauge micropuncture needle. Using standard technique, the initial micro needle was exchanged over a 0.018 micro wire for a transitional 4 [place] micro sheath. The micro sheath was then exchanged over a 0.035 wire for a 6 French vascular sheath. A 100 cm angled vert catheter and Glidewire were then used to advanced into the left external iliac vein. Venography was performed. Patent external iliac vein. Duplicated common iliac vein. The IVC is widely patent. Nonocclusive thrombus is present within the external iliac vein. A pull-back venogram demonstrates significant occlusive thrombus in the common femoral vein, femoral vein and popliteal vein. Next, the catheter and wire combination were navigated up into the left subclavian vein. The glidewire was then exchanged for a 260 cm superstiff Amplatz wire. Initial angioplasty of the popliteal vein and distal femoral vein was performed utilizing an 8 x 40 mm Conquest balloon. This was performed in order to macerate and soften the thrombus to allow full expansion of the recovery cone of the Inari sheath device. Following angioplasty of the expected intravenous landing zone, the percutaneous tract was serially dilated to 16 [place]. The 57 [place] recovery cone sheath was then advanced over the wire and positioned in the popliteal vein. The recovery cone was successfully deployed. The Inari ClotTriever device was then advanced over the wire into the common iliac vein. Mechanical thrombectomy with extirpation of acute, subacute and chronic thrombus was then performed throughout the external iliac vein, common femoral, femoral and popliteal  veins. A multiple passes were taken with successful extirpation of a large volume of thrombus of varying chronicity. A 55 cm 6 [place] vascular sheath was then advanced coaxially over the wire through the 16 [place] Inari sheath. Left lower extremity venography was again performed. Restoration of patency of the venous system. There is a small amount of residual thrombus as well as chronic narrowing in the common femoral  vein. Therefore, the sheath was removed over the wire. A 12 x 40 mm Conquest balloon was then advanced into the region of stenosis and angioplasty performed. The balloon was inflated to full effacement and maintained for 90 seconds. One final pass with the ClotTriever device was then performed through the area extirpating a small amount of residual chronic thrombus. Final venography a through the sheath demonstrates restored patency and no significant residual stenosis or thrombus. Outflow is slow secondary to a paucity of inflow from the occluded calf veins. The sheath was then removed and hemostasis attained with the assistance of a pursestring suture using 0 silk suture. FINDINGS: 1. Large volume thrombus within the left external iliac, common femoral, femoral and popliteal veins. 2. 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 IMPRESSION: 1. Successful catheter directed mechanical thrombectomy with extirpation of a mix of acute, subacute and chronic thrombus. 2. Successful balloon angioplasty of common femoral venous stenosis to 12 mm. PLAN: 1. Bed rest with left leg straight for 2 hours. 2. Apply Ace bandage compression to left lower extremity. 3. Encourage ambulation beginning this evening or tomorrow morning. 4. Continue anticoagulation. Signed, Criselda Peaches, MD, [place] [facility] Radiology Electronically Signed   By: Jacqulynn Cadet M.D.   On: 01/16/2021 13:16   IR US Guide Vasc Access Left  Result Date: 01/16/2021 INDICATION:  55 year old female with acute left lower extremity femoropopliteal and calf vein DVT. She has been appropriately anticoagulated but continues to have progressive left lower extremity pain and swelling. Therefore, she is a candidate for percutaneous catheter directed thrombectomy. EXAM: THROMBOECTOMY MECHANICAL VENOUS; LEFT EXTREMITY VENOGRAPHY; IR ULTRASOUND GUIDANCE VASC ACCESS LEFT; IR PTA VENOUS EXCEPT DIALYSIS CIRCUIT; INFERIOR VENA CAVOGRAM COMPARISON:  CT venogram 01/15/2021 MEDICATIONS: 25 mg Benadryl, 5000 units heparin, 75 mL Omnipaque 300 ANESTHESIA/SEDATION: Versed 6 mg IV; Fentanyl 300s mcg IV Moderate Sedation Time:  86 minutes The patient was continuously monitored during the procedure by the interventional radiology nurse under my direct supervision. FLUOROSCOPY TIME: Fluoroscopy Time: 12 minutes 42 seconds (113 mGy). COMPLICATIONS: None immediate. TECHNIQUE: Informed written consent was obtained from the patient after a thorough discussion of the procedural risks, benefits and alternatives. All questions were addressed. Maximal Sterile Barrier Technique was utilized including caps, mask, sterile gowns, sterile gloves, sterile drape, hand hygiene and skin antiseptic. A timeout was performed prior to the initiation of the procedure. The popliteal vein was interrogated with ultrasound and found to be completely thrombosed. An image was obtained and stored for the medical record. Local anesthesia was attained by infiltration with 1% lidocaine. A small dermatotomy was made. Under real-time sonographic guidance, the vessel was punctured with a 21 gauge micropuncture needle. Using standard technique, the initial micro needle was exchanged over a 0.018 micro wire for a transitional 4 [place] micro sheath. The micro sheath was then exchanged over a 0.035 wire for a 6 [place] vascular sheath. A 100 cm angled vert catheter and Glidewire were then used to advanced into the left external iliac vein. Venography was  performed. Patent external iliac vein. Duplicated common iliac vein. The IVC is widely patent. Nonocclusive thrombus is present within the external iliac vein. A pull-back venogram demonstrates significant occlusive thrombus in the common femoral vein, femoral vein and popliteal vein. Next, the catheter and wire combination were navigated up into the left subclavian vein. The glidewire was then exchanged for a 260 cm superstiff Amplatz wire. Initial angioplasty of the popliteal vein and distal femoral vein was  performed utilizing an 8 x 40 mm Conquest balloon. This was performed in order to macerate and soften the thrombus to allow full expansion of the recovery cone of the Inari sheath device. Following angioplasty of the expected intravenous landing zone, the percutaneous tract was serially dilated to 16 [place]. The 74 [place] recovery cone sheath was then advanced over the wire and positioned in the popliteal vein. The recovery cone was successfully deployed. The Inari ClotTriever device was then advanced over the wire into the common iliac vein. Mechanical thrombectomy with extirpation of acute, subacute and chronic thrombus was then performed throughout the external iliac vein, common femoral, femoral and popliteal veins. A multiple passes were taken with successful extirpation of a large volume of thrombus of varying chronicity. A 55 cm 6 [place] vascular sheath was then advanced coaxially over the wire through the 16 [place] Inari sheath. Left lower extremity venography was again performed. Restoration of patency of the venous system. There is a small amount of residual thrombus as well as chronic narrowing in the common femoral vein. Therefore, the sheath was removed over the wire. A 12 x 40 mm Conquest balloon was then advanced into the region of stenosis and angioplasty performed. The balloon was inflated to full effacement and maintained for 90 seconds. One final pass with the ClotTriever device was  then performed through the area extirpating a small amount of residual chronic thrombus. Final venography a through the sheath demonstrates restored patency and no significant residual stenosis or thrombus. Outflow is slow secondary to a paucity of inflow from the occluded calf veins. The sheath was then removed and hemostasis attained with the assistance of a pursestring suture using 0 silk suture. FINDINGS: 1. Large volume thrombus within the left external iliac, common femoral, femoral and popliteal veins. 2. 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 IMPRESSION: 1. Successful catheter directed mechanical thrombectomy with extirpation of a mix of acute, subacute and chronic thrombus. 2. Successful balloon angioplasty of common femoral venous stenosis to 12 mm. PLAN: 1. Bed rest with left leg straight for 2 hours. 2. Apply Ace bandage compression to left lower extremity. 3. Encourage ambulation beginning this evening or tomorrow morning. 4. Continue anticoagulation. Signed, Criselda Peaches, MD, [place] [facility] Radiology Electronically Signed   By: Jacqulynn Cadet M.D.   On: 01/16/2021 13:16   IR PTA VENOUS EXCEPT DIALYSIS CIRCUIT  Result Date: 01/16/2021 INDICATION: 55 year old female with acute left lower extremity femoropopliteal and calf vein DVT. She has been appropriately anticoagulated but continues to have progressive left lower extremity pain and swelling. Therefore, she is a candidate for percutaneous catheter directed thrombectomy. EXAM: THROMBOECTOMY MECHANICAL VENOUS; LEFT EXTREMITY VENOGRAPHY; IR ULTRASOUND GUIDANCE VASC ACCESS LEFT; IR PTA VENOUS EXCEPT DIALYSIS CIRCUIT; INFERIOR VENA CAVOGRAM COMPARISON:  CT venogram 01/15/2021 MEDICATIONS: 25 mg Benadryl, 5000 units heparin, 75 mL Omnipaque 300 ANESTHESIA/SEDATION: Versed 6 mg IV; Fentanyl 300s mcg IV Moderate Sedation Time:  86 minutes The patient was continuously monitored during  the procedure by the interventional radiology nurse under my direct supervision. FLUOROSCOPY TIME: Fluoroscopy Time: 12 minutes 42 seconds (113 mGy). COMPLICATIONS: None immediate. TECHNIQUE: Informed written consent was obtained from the patient after a thorough discussion of the procedural risks, benefits and alternatives. All questions were addressed. Maximal Sterile Barrier Technique was utilized including caps, mask, sterile gowns, sterile gloves, sterile drape, hand hygiene and skin antiseptic. A timeout was performed prior to the initiation of the procedure. The popliteal vein was  interrogated with ultrasound and found to be completely thrombosed. An image was obtained and stored for the medical record. Local anesthesia was attained by infiltration with 1% lidocaine. A small dermatotomy was made. Under real-time sonographic guidance, the vessel was punctured with a 21 gauge micropuncture needle. Using standard technique, the initial micro needle was exchanged over a 0.018 micro wire for a transitional 4 [place] micro sheath. The micro sheath was then exchanged over a 0.035 wire for a 6 [place] vascular sheath. A 100 cm angled vert catheter and Glidewire were then used to advanced into the left external iliac vein. Venography was performed. Patent external iliac vein. Duplicated common iliac vein. The IVC is widely patent. Nonocclusive thrombus is present within the external iliac vein. A pull-back venogram demonstrates significant occlusive thrombus in the common femoral vein, femoral vein and popliteal vein. Next, the catheter and wire combination were navigated up into the left subclavian vein. The glidewire was then exchanged for a 260 cm superstiff Amplatz wire. Initial angioplasty of the popliteal vein and distal femoral vein was performed utilizing an 8 x 40 mm Conquest balloon. This was performed in order to macerate and soften the thrombus to allow full expansion of the recovery cone of the Inari sheath  device. Following angioplasty of the expected intravenous landing zone, the percutaneous tract was serially dilated to 16 [place]. The 51 [place] recovery cone sheath was then advanced over the wire and positioned in the popliteal vein. The recovery cone was successfully deployed. The Inari ClotTriever device was then advanced over the wire into the common iliac vein. Mechanical thrombectomy with extirpation of acute, subacute and chronic thrombus was then performed throughout the external iliac vein, common femoral, femoral and popliteal veins. A multiple passes were taken with successful extirpation of a large volume of thrombus of varying chronicity. A 55 cm 6 [place] vascular sheath was then advanced coaxially over the wire through the 16 [place] Inari sheath. Left lower extremity venography was again performed. Restoration of patency of the venous system. There is a small amount of residual thrombus as well as chronic narrowing in the common femoral vein. Therefore, the sheath was removed over the wire. A 12 x 40 mm Conquest balloon was then advanced into the region of stenosis and angioplasty performed. The balloon was inflated to full effacement and maintained for 90 seconds. One final pass with the ClotTriever device was then performed through the area extirpating a small amount of residual chronic thrombus. Final venography a through the sheath demonstrates restored patency and no significant residual stenosis or thrombus. Outflow is slow secondary to a paucity of inflow from the occluded calf veins. The sheath was then removed and hemostasis attained with the assistance of a pursestring suture using 0 silk suture. FINDINGS: 1. Large volume thrombus within the left external iliac, common femoral, femoral and popliteal veins. 2. 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 IMPRESSION: 1. Successful catheter directed mechanical thrombectomy with extirpation of a mix of acute, subacute and chronic  thrombus. 2. Successful balloon angioplasty of common femoral venous stenosis to 12 mm. PLAN: 1. Bed rest with left leg straight for 2 hours. 2. Apply Ace bandage compression to left lower extremity. 3. Encourage ambulation beginning this evening or tomorrow morning. 4. Continue anticoagulation. Signed, Criselda Peaches, MD, [place] [facility] Radiology Electronically Signed   By: Jacqulynn Cadet M.D.   On: 01/16/2021 13:16   ECHOCARDIOGRAM COMPLETE  Result Date: 01/16/2021  ECHOCARDIOGRAM REPORT   Patient Name:   Kristina Owens Date of Exam: 01/16/2021 Medical Rec #:  [id number]         Height:       63.0 in Accession #:    [id number]        Weight:       150.0 lb Date of Birth:  12-09-1965        BSA:          45.711 m Patient Age:    19 years          BP:           120/65 mmHg Patient Gender: F                 HR:           77 bpm. Exam Location:  Inpatient Procedure: 2D Echo, Cardiac Doppler and Color Doppler Indications:    Pulmonary embolus  History:        Patient has no prior history of Echocardiogram examinations.                 Risk Factors:Current Smoker.  Sonographer:    Cammy Brochure Referring Phys: [phone] Anitria Andon A Kaleen Rochette IMPRESSIONS  1. Left ventricular ejection fraction, by estimation, is 60 to 65%. The left ventricle has normal function. The left ventricle has no regional wall motion abnormalities. Left ventricular diastolic parameters were normal.  2. Right ventricular systolic function is normal. The right ventricular size is normal.  3. The mitral valve is normal in structure. No evidence of mitral valve regurgitation.  4. The aortic valve is tricuspid. Aortic valve regurgitation is not visualized. No aortic stenosis is present. Comparison(s): No prior Echocardiogram. FINDINGS  Left Ventricle: Left ventricular ejection fraction, by estimation, is 60 to 65%. The left ventricle has normal function. The left ventricle has no regional  wall motion abnormalities. The left ventricular internal cavity size was normal in size. There is  no left ventricular hypertrophy. Left ventricular diastolic parameters were normal. Right Ventricle: The right ventricular size is normal. No increase in right ventricular wall thickness. Right ventricular systolic function is normal. Left Atrium: Left atrial size was normal in size. Right Atrium: Right atrial size was normal in size. Pericardium: There is no evidence of pericardial effusion. Mitral Valve: The mitral valve is normal in structure. No evidence of mitral valve regurgitation. Tricuspid Valve: The tricuspid valve is normal in structure. Tricuspid valve regurgitation is trivial. No evidence of tricuspid stenosis. Aortic Valve: The aortic valve is tricuspid. Aortic valve regurgitation is not visualized. No aortic stenosis is present. Aortic valve mean gradient measures 4.0 mmHg. Aortic valve peak gradient measures 7.7 mmHg. Aortic valve area, by VTI measures 2.44 cm. Pulmonic Valve: The pulmonic valve was grossly normal. Pulmonic valve regurgitation is trivial. No evidence of pulmonic stenosis. Aorta: The aortic root is normal in size and structure. IAS/Shunts: The atrial septum is grossly normal.  LEFT VENTRICLE PLAX 2D LVIDd:         4.90 cm  Diastology LVIDs:         3.30 cm  LV e' medial:    8.05 cm/s LV PW:         0.90 cm  LV E/e' medial:  10.5 LV IVS:        0.90 cm  LV e' lateral:   12.00 cm/s LVOT diam:     2.00 cm  LV E/e' lateral: 7.1 LV SV:  70 LV SV Index:   41 LVOT Area:     3.14 cm  RIGHT VENTRICLE RV Basal diam:  3.60 cm RV S prime:     11.70 cm/s TAPSE (M-mode): 2.3 cm LEFT ATRIUM             Index       RIGHT ATRIUM           Index LA diam:        3.70 cm 2.16 cm/m  RA Area:     12.90 cm LA Vol (A2C):   38.0 ml 22.21 ml/m RA Volume:   33.00 ml  19.29 ml/m LA Vol (A4C):   43.1 ml 25.19 ml/m LA Biplane Vol: 42.9 ml 25.07 ml/m  AORTIC VALVE AV Area (Vmax):    2.67 cm AV Area  (Vmean):   2.53 cm AV Area (VTI):     2.44 cm AV Vmax:           139.00 cm/s AV Vmean:          88.200 cm/s AV VTI:            0.288 m AV Peak Grad:      7.7 mmHg AV Mean Grad:      4.0 mmHg LVOT Vmax:         118.00 cm/s LVOT Vmean:        71.000 cm/s LVOT VTI:          0.224 m LVOT/AV VTI ratio: 0.78  AORTA Ao Root diam: 2.70 cm MITRAL VALVE               TRICUSPID VALVE MV Area (PHT): 5.84 cm    TR Peak grad:   38.7 mmHg MV Decel Time: 130 msec    TR Vmax:        311.00 cm/s MV E velocity: 84.80 cm/s MV A velocity: 47.60 cm/s  SHUNTS MV E/A ratio:  1.78        Systemic VTI:  0.22 m                            Systemic Diam: 2.00 cm Rudean Haskell MD Electronically signed by Rudean Haskell MD Signature Date/Time: 01/16/2021/5:00:40 PM    Final    CT VENOGRAM ABD/PEL  Result Date: 01/15/2021 CLINICAL DATA:  55 year old female with pulmonary emboli and acute left lower extremity DVT. She is having persistent and worsening left lower extremity pain and swelling despite being on anticoagulation. Evaluate for evidence of iliac vein compression syndrome. EXAM: CT VENOGRAM ABDOMEN AND PELVIS TECHNIQUE: CT of the abdomen and pelvis obtained following administration of intravenous contrast. Vascular timing optimized for venous phase imaging. CONTRAST:  144m OMNIPAQUE IOHEXOL 350 MG/ML SOLN COMPARISON:  CT abdomen/pelvis 07/05/2019 FINDINGS: VASCULAR IVC: Widely patent IVC. No evidence of IVC duplication or other congenital anomaly. Hepatic/portal veins: Widely patent. Renal veins: Widely patent.  Normal anatomy. Visceral veins: IMV and SMV are widely patent and unremarkable. Iliac veins: Bilateral common, internal and external iliac veins are widely patent. No evidence of iliac vein compression physiology. Femoral veins: Marked expansion of the left common femoral vein with central filling defect consistent with DVT. DVT extends into the profunda and femoral vein as well as just into the saphenofemoral  junction. There is associated inflammatory stranding within the perivenous fat. The right femoral veins are patent and unremarkable. NON VASCULAR Lower chest: Small left pleural effusion with associated left lower lobe atelectasis.  No evidence of pulmonary infarction. Limited visualization of the left lower lobe pulmonary arteries demonstrates some residual linear filling defects consistent with evolving pulmonary emboli. The heart is normal in size. No evidence of right heart strain. Hepatobiliary: Geographic hypoattenuation in the left hemi-liver adjacent to the fissure for the falciform ligament is nonspecific but most suggestive of benign focal fatty infiltration. Normal hepatic contour and morphology. No discrete hepatic lesions. Normal appearance of the gallbladder. No intra or extrahepatic biliary ductal dilatation. Pancreas: No mass or inflammatory changes Spleen: Normal size and morphology. Genitourinary: Normal adrenal glands. No evidence of hydronephrosis, nephrolithiasis or enhancing renal mass. The ureters and kidneys are also unremarkable. The bladder is normal. Bowel: No evidence of obstruction or focal bowel wall thickening. The terminal ileum is unremarkable. Reproductive: Normal uterus and adnexa.  No adnexal mass. Other: No significant ascites or abdominal wall hernia. Edema present in the left upper thigh and inguinal region. Musculoskeletal: No acute fracture or aggressive appearing lytic or blastic osseous lesion. IMPRESSION: 1. No evidence of iliac vein compression physiology or ileo caval DVT. 2. Positive for occlusive DVT in the left common femoral vein, femoral vein, profunda femoral vein and saphenofemoral junction with associated left upper thigh edema. 3. Small left pleural effusion with associated left lower lobe atelectasis. 4. Expected evolution of pulmonary emboli within the visualized pulmonary arteries. No evidence of right heart strain. Signed, Criselda Peaches, MD, [place]  [facility] Radiology Electronically Signed   By: Jacqulynn Cadet M.D.   On: 01/15/2021 16:31        Scheduled Meds:  apixaban  10 mg Oral BID   Followed by   Derrill Memo ON 01/18/2021] apixaban  5 mg Oral BID   ARIPiprazole  10 mg Oral Daily   busPIRone  10 mg Oral TID   folic acid  1 mg Oral Daily   lamoTRIgine  25 mg Oral Daily   multivitamin with minerals  1 tablet Oral Daily   nicotine  21 mg Transdermal Daily   sertraline  50 mg Oral Daily   sodium chloride flush  3 mL Intravenous Q12H   thiamine  100 mg Oral Daily   Or   thiamine  100 mg Intravenous Daily   Continuous Infusions:     LOS: 9 days    Time spent: 35 minutes    Brigett Estell A Adream Parzych, MD [facility]   If 7PM-7AM, please contact night-coverage [email or web address]  01/17/2021, 3:27 PM

## 2021-01-17 NOTE — Progress Notes (Signed)
Chief Complaint: Patient was seen today for follow up LLE DVT thrombectomy   Supervising Physician: Ruel Favors  Patient Status: [facility] - In-pt  Subjective: S/p successful LLE DVT thrombectomy and PTA of CFV Feels pretty good, just some calf aching but overall leg discomfort and 'heaviness' much better.  Objective: Physical Exam: BP 100/67 (BP Location: Right Arm)   Pulse 61   Temp 98.2 F (36.8 C) (Oral)   Resp 16   Ht  (1.6 m)   Wt 68 kg   SpO2 90%   BMI 26.57 kg/m  LLE thigh soft, NT Popliteal access site soft, NT Calf still with soft edema but NT Foot warm.   Current Facility-Administered Medications:    acetaminophen (TYLENOL) tablet 650 mg, 650 mg, Oral, Q6H PRN, 650 mg at 01/11/21 1130 **OR** acetaminophen (TYLENOL) suppository 650 mg, 650 mg, Rectal, Q6H PRN, Jae Dire, MD   apixaban (ELIQUIS) tablet 10 mg, 10 mg, Oral, BID, 10 mg at 01/17/21 0833 **FOLLOWED BY** [START ON 01/18/2021] apixaban (ELIQUIS) tablet 5 mg, 5 mg, Oral, BID, Cipriano Bunker, MD   ARIPiprazole (ABILIFY) tablet 10 mg, 10 mg, Oral, Daily, Cipriano Bunker, MD, 10 mg at 01/17/21 4098   benzonatate (TESSALON) capsule 100 mg, 100 mg, Oral, TID PRN, Cipriano Bunker, MD, 100 mg at 01/15/21 1738   busPIRone (BUSPAR) tablet 10 mg, 10 mg, Oral, TID, Cipriano Bunker, MD, 10 mg at 01/17/21 0834   cefTRIAXone (ROCEPHIN) 2 g in sodium chloride 0.9 % 100 mL IVPB, 2 g, Intravenous, Q24H, Regalado, Belkys A, MD, Stopped at 01/16/21 1330   folic acid (FOLVITE) tablet 1 mg, 1 mg, Oral, Daily, Whitney Post E, MD, 1 mg at 01/17/21 0834   iohexol (OMNIPAQUE) 300 MG/ML solution 100 mL, 100 mL, Intravenous, Once PRN, Sterling Big, MD   lamoTRIgine (LAMICTAL) tablet 25 mg, 25 mg, Oral, Daily, Cipriano Bunker, MD, 25 mg at 01/17/21 1191   morphine 2 MG/ML injection 2 mg, 2 mg, Intravenous, Q2H PRN, Jae Dire, MD, 2 mg at 01/17/21 0831   multivitamin with minerals tablet 1 tablet, 1 tablet, Oral,  Daily, Jae Dire, MD, 1 tablet at 01/17/21 4782   nicotine (NICODERM CQ - dosed in mg/24 hours) patch 21 mg, 21 mg, Transdermal, Daily, Zierle-Ghosh, Asia B, DO, 21 mg at 01/17/21 0835   oxyCODONE-acetaminophen (PERCOCET/ROXICET) 5-325 MG per tablet 1-2 tablet, 1-2 tablet, Oral, Q6H PRN, Regalado, Belkys A, MD, 2 tablet at 01/17/21 0931   polyethylene glycol (MIRALAX / GLYCOLAX) packet 17 g, 17 g, Oral, Daily PRN, Jae Dire, MD   sertraline (ZOLOFT) tablet 50 mg, 50 mg, Oral, Daily, Cipriano Bunker, MD, 50 mg at 01/17/21 0834   sodium chloride flush (NS) 0.9 % injection 3 mL, 3 mL, Intravenous, Q12H, Jae Dire, MD, 3 mL at 01/17/21 0835   thiamine tablet 100 mg, 100 mg, Oral, Daily, 100 mg at 01/17/21 0834 **OR** thiamine (B-1) injection 100 mg, 100 mg, Intravenous, Daily, Jae Dire, MD  Labs: CBC Recent Labs    01/16/21 0505 01/17/21 0816  WBC 4.7 6.4  HGB 12.5 12.2  HCT 39.5 38.4  PLT 436* 451*   BMET Recent Labs    01/16/21 0505 01/17/21 0816  NA 138 137  K 3.4* 4.3  CL 107 103  CO2 24 28  GLUCOSE 83 94  BUN <5* 5*  CREATININE 0.47 0.57  CALCIUM 8.2* 8.5*   LFT No results for input(s): PROT, ALBUMIN, AST, ALT, ALKPHOS,  BILITOT, BILIDIR, IBILI, LIPASE in the last 72 hours. PT/INR Recent Labs    01/16/21 0505  LABPROT 15.4*  INR 1.2     Studies/Results: IR Veno/Ext/Uni Left  Result Date: 01/16/2021 INDICATION: 55 year old female with acute left lower extremity femoropopliteal and calf vein DVT. She has been appropriately anticoagulated but continues to have progressive left lower extremity pain and swelling. Therefore, she is a candidate for percutaneous catheter directed thrombectomy. EXAM: THROMBOECTOMY MECHANICAL VENOUS; LEFT EXTREMITY VENOGRAPHY; IR ULTRASOUND GUIDANCE VASC ACCESS LEFT; IR PTA VENOUS EXCEPT DIALYSIS CIRCUIT; INFERIOR VENA CAVOGRAM COMPARISON:  CT venogram 01/15/2021 MEDICATIONS: 25 mg Benadryl, 5000 units heparin, 75 mL Omnipaque  300 ANESTHESIA/SEDATION: Versed 6 mg IV; Fentanyl 300s mcg IV Moderate Sedation Time:  86 minutes The patient was continuously monitored during the procedure by the interventional radiology nurse under my direct supervision. FLUOROSCOPY TIME: Fluoroscopy Time: 12 minutes 42 seconds (113 mGy). COMPLICATIONS: None immediate. TECHNIQUE: Informed written consent was obtained from the patient after a thorough discussion of the procedural risks, benefits and alternatives. All questions were addressed. Maximal Sterile Barrier Technique was utilized including caps, mask, sterile gowns, sterile gloves, sterile drape, hand hygiene and skin antiseptic. A timeout was performed prior to the initiation of the procedure. The popliteal vein was interrogated with ultrasound and found to be completely thrombosed. An image was obtained and stored for the medical record. Local anesthesia was attained by infiltration with 1% lidocaine. A small dermatotomy was made. Under real-time sonographic guidance, the vessel was punctured with a 21 gauge micropuncture needle. Using standard technique, the initial micro needle was exchanged over a 0.018 micro wire for a transitional 4 [place] micro sheath. The micro sheath was then exchanged over a 0.035 wire for a 6 French vascular sheath. A 100 cm angled vert catheter and Glidewire were then used to advanced into the left external iliac vein. Venography was performed. Patent external iliac vein. Duplicated common iliac vein. The IVC is widely patent. Nonocclusive thrombus is present within the external iliac vein. A pull-back venogram demonstrates significant occlusive thrombus in the common femoral vein, femoral vein and popliteal vein. Next, the catheter and wire combination were navigated up into the left subclavian vein. The glidewire was then exchanged for a 260 cm superstiff Amplatz wire. Initial angioplasty of the popliteal vein and distal femoral vein was performed utilizing an 8 x 40 mm  Conquest balloon. This was performed in order to macerate and soften the thrombus to allow full expansion of the recovery cone of the Inari sheath device. Following angioplasty of the expected intravenous landing zone, the percutaneous tract was serially dilated to 16 [place]. The 32 [place] recovery cone sheath was then advanced over the wire and positioned in the popliteal vein. The recovery cone was successfully deployed. The Inari ClotTriever device was then advanced over the wire into the common iliac vein. Mechanical thrombectomy with extirpation of acute, subacute and chronic thrombus was then performed throughout the external iliac vein, common femoral, femoral and popliteal veins. A multiple passes were taken with successful extirpation of a large volume of thrombus of varying chronicity. A 55 cm 6 [place] vascular sheath was then advanced coaxially over the wire through the 16 [place] Inari sheath. Left lower extremity venography was again performed. Restoration of patency of the venous system. There is a small amount of residual thrombus as well as chronic narrowing in the common femoral vein. Therefore, the sheath was removed over the wire. A 12 x 40 mm Conquest balloon was then  advanced into the region of stenosis and angioplasty performed. The balloon was inflated to full effacement and maintained for 90 seconds. One final pass with the ClotTriever device was then performed through the area extirpating a small amount of residual chronic thrombus. Final venography a through the sheath demonstrates restored patency and no significant residual stenosis or thrombus. Outflow is slow secondary to a paucity of inflow from the occluded calf veins. The sheath was then removed and hemostasis attained with the assistance of a pursestring suture using 0 silk suture. FINDINGS: 1. Large volume thrombus within the left external iliac, common femoral, femoral and popliteal veins. 2. 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 IMPRESSION: 1. Successful catheter directed mechanical thrombectomy with extirpation of a mix of acute, subacute and chronic thrombus. 2. Successful balloon angioplasty of common femoral venous stenosis to 12 mm. PLAN: 1. Bed rest with left leg straight for 2 hours. 2. Apply Ace bandage compression to left lower extremity. 3. Encourage ambulation beginning this evening or tomorrow morning. 4. Continue anticoagulation. Signed, Sterling Big, MD, RPVI [facility] Radiology Electronically Signed   By: Malachy Moan M.D.   On: 01/16/2021 13:16   IR Venocavagram Ivc  Result Date: 01/16/2021 INDICATION: 55 year old female with acute left lower extremity femoropopliteal and calf vein DVT. She has been appropriately anticoagulated but continues to have progressive left lower extremity pain and swelling. Therefore, she is a candidate for percutaneous catheter directed thrombectomy. EXAM: THROMBOECTOMY MECHANICAL VENOUS; LEFT EXTREMITY VENOGRAPHY; IR ULTRASOUND GUIDANCE VASC ACCESS LEFT; IR PTA VENOUS EXCEPT DIALYSIS CIRCUIT; INFERIOR VENA CAVOGRAM COMPARISON:  CT venogram 01/15/2021 MEDICATIONS: 25 mg Benadryl, 5000 units heparin, 75 mL Omnipaque 300 ANESTHESIA/SEDATION: Versed 6 mg IV; Fentanyl 300s mcg IV Moderate Sedation Time:  86 minutes The patient was continuously monitored during the procedure by the interventional radiology nurse under my direct supervision. FLUOROSCOPY TIME: Fluoroscopy Time: 12 minutes 42 seconds (113 mGy). COMPLICATIONS: None immediate. TECHNIQUE: Informed written consent was obtained from the patient after a thorough discussion of the procedural risks, benefits and alternatives. All questions were addressed. Maximal Sterile Barrier Technique was utilized including caps, mask, sterile gowns, sterile gloves, sterile drape, hand hygiene and skin antiseptic. A timeout was performed prior to the initiation of the  procedure. The popliteal vein was interrogated with ultrasound and found to be completely thrombosed. An image was obtained and stored for the medical record. Local anesthesia was attained by infiltration with 1% lidocaine. A small dermatotomy was made. Under real-time sonographic guidance, the vessel was punctured with a 21 gauge micropuncture needle. Using standard technique, the initial micro needle was exchanged over a 0.018 micro wire for a transitional 4 [place] micro sheath. The micro sheath was then exchanged over a 0.035 wire for a 6 French vascular sheath. A 100 cm angled vert catheter and Glidewire were then used to advanced into the left external iliac vein. Venography was performed. Patent external iliac vein. Duplicated common iliac vein. The IVC is widely patent. Nonocclusive thrombus is present within the external iliac vein. A pull-back venogram demonstrates significant occlusive thrombus in the common femoral vein, femoral vein and popliteal vein. Next, the catheter and wire combination were navigated up into the left subclavian vein. The glidewire was then exchanged for a 260 cm superstiff Amplatz wire. Initial angioplasty of the popliteal vein and distal femoral vein was performed utilizing an 8 x 40 mm Conquest balloon. This was performed in order to macerate and soften the thrombus to  allow full expansion of the recovery cone of the Inari sheath device. Following angioplasty of the expected intravenous landing zone, the percutaneous tract was serially dilated to 16 [place]. The 5 [place] recovery cone sheath was then advanced over the wire and positioned in the popliteal vein. The recovery cone was successfully deployed. The Inari ClotTriever device was then advanced over the wire into the common iliac vein. Mechanical thrombectomy with extirpation of acute, subacute and chronic thrombus was then performed throughout the external iliac vein, common femoral, femoral and popliteal veins. A  multiple passes were taken with successful extirpation of a large volume of thrombus of varying chronicity. A 55 cm 6 [place] vascular sheath was then advanced coaxially over the wire through the 16 [place] Inari sheath. Left lower extremity venography was again performed. Restoration of patency of the venous system. There is a small amount of residual thrombus as well as chronic narrowing in the common femoral vein. Therefore, the sheath was removed over the wire. A 12 x 40 mm Conquest balloon was then advanced into the region of stenosis and angioplasty performed. The balloon was inflated to full effacement and maintained for 90 seconds. One final pass with the ClotTriever device was then performed through the area extirpating a small amount of residual chronic thrombus. Final venography a through the sheath demonstrates restored patency and no significant residual stenosis or thrombus. Outflow is slow secondary to a paucity of inflow from the occluded calf veins. The sheath was then removed and hemostasis attained with the assistance of a pursestring suture using 0 silk suture. FINDINGS: 1. Large volume thrombus within the left external iliac, common femoral, femoral and popliteal veins. 2. 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 IMPRESSION: 1. Successful catheter directed mechanical thrombectomy with extirpation of a mix of acute, subacute and chronic thrombus. 2. Successful balloon angioplasty of common femoral venous stenosis to 12 mm. PLAN: 1. Bed rest with left leg straight for 2 hours. 2. Apply Ace bandage compression to left lower extremity. 3. Encourage ambulation beginning this evening or tomorrow morning. 4. Continue anticoagulation. Signed, Sterling Big, MD, RPVI [facility] Radiology Electronically Signed   By: Malachy Moan M.D.   On: 01/16/2021 13:16   IR THROMBECT VENO MECH MOD SED  Result Date: 01/16/2021 INDICATION:  55 year old female with acute left lower extremity femoropopliteal and calf vein DVT. She has been appropriately anticoagulated but continues to have progressive left lower extremity pain and swelling. Therefore, she is a candidate for percutaneous catheter directed thrombectomy. EXAM: THROMBOECTOMY MECHANICAL VENOUS; LEFT EXTREMITY VENOGRAPHY; IR ULTRASOUND GUIDANCE VASC ACCESS LEFT; IR PTA VENOUS EXCEPT DIALYSIS CIRCUIT; INFERIOR VENA CAVOGRAM COMPARISON:  CT venogram 01/15/2021 MEDICATIONS: 25 mg Benadryl, 5000 units heparin, 75 mL Omnipaque 300 ANESTHESIA/SEDATION: Versed 6 mg IV; Fentanyl 300s mcg IV Moderate Sedation Time:  86 minutes The patient was continuously monitored during the procedure by the interventional radiology nurse under my direct supervision. FLUOROSCOPY TIME: Fluoroscopy Time: 12 minutes 42 seconds (113 mGy). COMPLICATIONS: None immediate. TECHNIQUE: Informed written consent was obtained from the patient after a thorough discussion of the procedural risks, benefits and alternatives. All questions were addressed. Maximal Sterile Barrier Technique was utilized including caps, mask, sterile gowns, sterile gloves, sterile drape, hand hygiene and skin antiseptic. A timeout was performed prior to the initiation of the procedure. The popliteal vein was interrogated with ultrasound and found to be completely thrombosed. An image was obtained and stored for the medical record. Local anesthesia  was attained by infiltration with 1% lidocaine. A small dermatotomy was made. Under real-time sonographic guidance, the vessel was punctured with a 21 gauge micropuncture needle. Using standard technique, the initial micro needle was exchanged over a 0.018 micro wire for a transitional 4 [place] micro sheath. The micro sheath was then exchanged over a 0.035 wire for a 6 French vascular sheath. A 100 cm angled vert catheter and Glidewire were then used to advanced into the left external iliac vein. Venography was  performed. Patent external iliac vein. Duplicated common iliac vein. The IVC is widely patent. Nonocclusive thrombus is present within the external iliac vein. A pull-back venogram demonstrates significant occlusive thrombus in the common femoral vein, femoral vein and popliteal vein. Next, the catheter and wire combination were navigated up into the left subclavian vein. The glidewire was then exchanged for a 260 cm superstiff Amplatz wire. Initial angioplasty of the popliteal vein and distal femoral vein was performed utilizing an 8 x 40 mm Conquest balloon. This was performed in order to macerate and soften the thrombus to allow full expansion of the recovery cone of the Inari sheath device. Following angioplasty of the expected intravenous landing zone, the percutaneous tract was serially dilated to 16 [place]. The 49 [place] recovery cone sheath was then advanced over the wire and positioned in the popliteal vein. The recovery cone was successfully deployed. The Inari ClotTriever device was then advanced over the wire into the common iliac vein. Mechanical thrombectomy with extirpation of acute, subacute and chronic thrombus was then performed throughout the external iliac vein, common femoral, femoral and popliteal veins. A multiple passes were taken with successful extirpation of a large volume of thrombus of varying chronicity. A 55 cm 6 [place] vascular sheath was then advanced coaxially over the wire through the 16 [place] Inari sheath. Left lower extremity venography was again performed. Restoration of patency of the venous system. There is a small amount of residual thrombus as well as chronic narrowing in the common femoral vein. Therefore, the sheath was removed over the wire. A 12 x 40 mm Conquest balloon was then advanced into the region of stenosis and angioplasty performed. The balloon was inflated to full effacement and maintained for 90 seconds. One final pass with the ClotTriever device was  then performed through the area extirpating a small amount of residual chronic thrombus. Final venography a through the sheath demonstrates restored patency and no significant residual stenosis or thrombus. Outflow is slow secondary to a paucity of inflow from the occluded calf veins. The sheath was then removed and hemostasis attained with the assistance of a pursestring suture using 0 silk suture. FINDINGS: 1. Large volume thrombus within the left external iliac, common femoral, femoral and popliteal veins. 2. 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 IMPRESSION: 1. Successful catheter directed mechanical thrombectomy with extirpation of a mix of acute, subacute and chronic thrombus. 2. Successful balloon angioplasty of common femoral venous stenosis to 12 mm. PLAN: 1. Bed rest with left leg straight for 2 hours. 2. Apply Ace bandage compression to left lower extremity. 3. Encourage ambulation beginning this evening or tomorrow morning. 4. Continue anticoagulation. Signed, Sterling Big, MD, RPVI [facility] Radiology Electronically Signed   By: Malachy Moan M.D.   On: 01/16/2021 13:16   IR US Guide Vasc Access Left  Result Date: 01/16/2021 INDICATION: 55 year old female with acute left lower extremity femoropopliteal and calf vein DVT. She has been appropriately anticoagulated but continues  to have progressive left lower extremity pain and swelling. Therefore, she is a candidate for percutaneous catheter directed thrombectomy. EXAM: THROMBOECTOMY MECHANICAL VENOUS; LEFT EXTREMITY VENOGRAPHY; IR ULTRASOUND GUIDANCE VASC ACCESS LEFT; IR PTA VENOUS EXCEPT DIALYSIS CIRCUIT; INFERIOR VENA CAVOGRAM COMPARISON:  CT venogram 01/15/2021 MEDICATIONS: 25 mg Benadryl, 5000 units heparin, 75 mL Omnipaque 300 ANESTHESIA/SEDATION: Versed 6 mg IV; Fentanyl 300s mcg IV Moderate Sedation Time:  86 minutes The patient was continuously monitored during the  procedure by the interventional radiology nurse under my direct supervision. FLUOROSCOPY TIME: Fluoroscopy Time: 12 minutes 42 seconds (113 mGy). COMPLICATIONS: None immediate. TECHNIQUE: Informed written consent was obtained from the patient after a thorough discussion of the procedural risks, benefits and alternatives. All questions were addressed. Maximal Sterile Barrier Technique was utilized including caps, mask, sterile gowns, sterile gloves, sterile drape, hand hygiene and skin antiseptic. A timeout was performed prior to the initiation of the procedure. The popliteal vein was interrogated with ultrasound and found to be completely thrombosed. An image was obtained and stored for the medical record. Local anesthesia was attained by infiltration with 1% lidocaine. A small dermatotomy was made. Under real-time sonographic guidance, the vessel was punctured with a 21 gauge micropuncture needle. Using standard technique, the initial micro needle was exchanged over a 0.018 micro wire for a transitional 4 [place] micro sheath. The micro sheath was then exchanged over a 0.035 wire for a 6 French vascular sheath. A 100 cm angled vert catheter and Glidewire were then used to advanced into the left external iliac vein. Venography was performed. Patent external iliac vein. Duplicated common iliac vein. The IVC is widely patent. Nonocclusive thrombus is present within the external iliac vein. A pull-back venogram demonstrates significant occlusive thrombus in the common femoral vein, femoral vein and popliteal vein. Next, the catheter and wire combination were navigated up into the left subclavian vein. The glidewire was then exchanged for a 260 cm superstiff Amplatz wire. Initial angioplasty of the popliteal vein and distal femoral vein was performed utilizing an 8 x 40 mm Conquest balloon. This was performed in order to macerate and soften the thrombus to allow full expansion of the recovery cone of the Inari sheath  device. Following angioplasty of the expected intravenous landing zone, the percutaneous tract was serially dilated to 16 [place]. The 55 [place] recovery cone sheath was then advanced over the wire and positioned in the popliteal vein. The recovery cone was successfully deployed. The Inari ClotTriever device was then advanced over the wire into the common iliac vein. Mechanical thrombectomy with extirpation of acute, subacute and chronic thrombus was then performed throughout the external iliac vein, common femoral, femoral and popliteal veins. A multiple passes were taken with successful extirpation of a large volume of thrombus of varying chronicity. A 55 cm 6 [place] vascular sheath was then advanced coaxially over the wire through the 16 [place] Inari sheath. Left lower extremity venography was again performed. Restoration of patency of the venous system. There is a small amount of residual thrombus as well as chronic narrowing in the common femoral vein. Therefore, the sheath was removed over the wire. A 12 x 40 mm Conquest balloon was then advanced into the region of stenosis and angioplasty performed. The balloon was inflated to full effacement and maintained for 90 seconds. One final pass with the ClotTriever device was then performed through the area extirpating a small amount of residual chronic thrombus. Final venography a through the sheath demonstrates restored patency and no significant residual  stenosis or thrombus. Outflow is slow secondary to a paucity of inflow from the occluded calf veins. The sheath was then removed and hemostasis attained with the assistance of a pursestring suture using 0 silk suture. FINDINGS: 1. Large volume thrombus within the left external iliac, common femoral, femoral and popliteal veins. 2. 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 IMPRESSION: 1. Successful catheter directed mechanical thrombectomy with extirpation of a mix of acute, subacute and chronic  thrombus. 2. Successful balloon angioplasty of common femoral venous stenosis to 12 mm. PLAN: 1. Bed rest with left leg straight for 2 hours. 2. Apply Ace bandage compression to left lower extremity. 3. Encourage ambulation beginning this evening or tomorrow morning. 4. Continue anticoagulation. Signed, Sterling BigHeath K. McCullough, MD, RPVI [facility] Radiology Electronically Signed   By: Malachy MoanHeath  McCullough M.D.   On: 01/16/2021 13:16   IR PTA VENOUS EXCEPT DIALYSIS CIRCUIT  Result Date: 01/16/2021 INDICATION: 55 year old female with acute left lower extremity femoropopliteal and calf vein DVT. She has been appropriately anticoagulated but continues to have progressive left lower extremity pain and swelling. Therefore, she is a candidate for percutaneous catheter directed thrombectomy. EXAM: THROMBOECTOMY MECHANICAL VENOUS; LEFT EXTREMITY VENOGRAPHY; IR ULTRASOUND GUIDANCE VASC ACCESS LEFT; IR PTA VENOUS EXCEPT DIALYSIS CIRCUIT; INFERIOR VENA CAVOGRAM COMPARISON:  CT venogram 01/15/2021 MEDICATIONS: 25 mg Benadryl, 5000 units heparin, 75 mL Omnipaque 300 ANESTHESIA/SEDATION: Versed 6 mg IV; Fentanyl 300s mcg IV Moderate Sedation Time:  86 minutes The patient was continuously monitored during the procedure by the interventional radiology nurse under my direct supervision. FLUOROSCOPY TIME: Fluoroscopy Time: 12 minutes 42 seconds (113 mGy). COMPLICATIONS: None immediate. TECHNIQUE: Informed written consent was obtained from the patient after a thorough discussion of the procedural risks, benefits and alternatives. All questions were addressed. Maximal Sterile Barrier Technique was utilized including caps, mask, sterile gowns, sterile gloves, sterile drape, hand hygiene and skin antiseptic. A timeout was performed prior to the initiation of the procedure. The popliteal vein was interrogated with ultrasound and found to be completely thrombosed. An image was obtained and  stored for the medical record. Local anesthesia was attained by infiltration with 1% lidocaine. A small dermatotomy was made. Under real-time sonographic guidance, the vessel was punctured with a 21 gauge micropuncture needle. Using standard technique, the initial micro needle was exchanged over a 0.018 micro wire for a transitional 4 [place] micro sheath. The micro sheath was then exchanged over a 0.035 wire for a 6 French vascular sheath. A 100 cm angled vert catheter and Glidewire were then used to advanced into the left external iliac vein. Venography was performed. Patent external iliac vein. Duplicated common iliac vein. The IVC is widely patent. Nonocclusive thrombus is present within the external iliac vein. A pull-back venogram demonstrates significant occlusive thrombus in the common femoral vein, femoral vein and popliteal vein. Next, the catheter and wire combination were navigated up into the left subclavian vein. The glidewire was then exchanged for a 260 cm superstiff Amplatz wire. Initial angioplasty of the popliteal vein and distal femoral vein was performed utilizing an 8 x 40 mm Conquest balloon. This was performed in order to macerate and soften the thrombus to allow full expansion of the recovery cone of the Inari sheath device. Following angioplasty of the expected intravenous landing zone, the percutaneous tract was serially dilated to 16 [place]. The 5816 [place] Inari recovery cone sheath was then advanced over the wire and positioned in the popliteal vein. The recovery cone was  successfully deployed. The Inari ClotTriever device was then advanced over the wire into the common iliac vein. Mechanical thrombectomy with extirpation of acute, subacute and chronic thrombus was then performed throughout the external iliac vein, common femoral, femoral and popliteal veins. A multiple passes were taken with successful extirpation of a large volume of thrombus of varying chronicity. A 55 cm 6 [place]  vascular sheath was then advanced coaxially over the wire through the 16 [place] Inari sheath. Left lower extremity venography was again performed. Restoration of patency of the venous system. There is a small amount of residual thrombus as well as chronic narrowing in the common femoral vein. Therefore, the sheath was removed over the wire. A 12 x 40 mm Conquest balloon was then advanced into the region of stenosis and angioplasty performed. The balloon was inflated to full effacement and maintained for 90 seconds. One final pass with the ClotTriever device was then performed through the area extirpating a small amount of residual chronic thrombus. Final venography a through the sheath demonstrates restored patency and no significant residual stenosis or thrombus. Outflow is slow secondary to a paucity of inflow from the occluded calf veins. The sheath was then removed and hemostasis attained with the assistance of a pursestring suture using 0 silk suture. FINDINGS: 1. Large volume thrombus within the left external iliac, common femoral, femoral and popliteal veins. 2. 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 IMPRESSION: 1. Successful catheter directed mechanical thrombectomy with extirpation of a mix of acute, subacute and chronic thrombus. 2. Successful balloon angioplasty of common femoral venous stenosis to 12 mm. PLAN: 1. Bed rest with left leg straight for 2 hours. 2. Apply Ace bandage compression to left lower extremity. 3. Encourage ambulation beginning this evening or tomorrow morning. 4. Continue anticoagulation. Signed, Sterling Big, MD, RPVI [facility] Electronically Signed   By: Malachy Moan M.D.   On: 01/16/2021 13:16   ECHOCARDIOGRAM COMPLETE  Result Date: 01/16/2021    ECHOCARDIOGRAM REPORT   Patient Name:   EVELINA LORE Date of Exam: 01/16/2021 Medical Rec #:  [id number]         Height:       63.0 in Accession #:     [id number]        Weight:       150.0 lb Date of Birth:  31-Jan-1966        BSA:          1.711 m Patient Age:    54 years          BP:           120/65 mmHg Patient Gender: F                 HR:           77 bpm. Exam Location:  Inpatient Procedure: 2D Echo, Cardiac Doppler and Color Doppler Indications:    Pulmonary embolus  History:        Patient has no prior history of Echocardiogram examinations.                 Risk Factors:Current Smoker.  Sonographer:    Shirlean Kelly Referring Phys: [phone] BELKYS A REGALADO IMPRESSIONS  1. Left ventricular ejection fraction, by estimation, is 60 to 65%. The left ventricle has normal function. The left ventricle has no regional wall motion abnormalities. Left ventricular diastolic parameters were normal.  2. Right ventricular systolic function is normal. The right  ventricular size is normal.  3. The mitral valve is normal in structure. No evidence of mitral valve regurgitation.  4. The aortic valve is tricuspid. Aortic valve regurgitation is not visualized. No aortic stenosis is present. Comparison(s): No prior Echocardiogram. FINDINGS  Left Ventricle: Left ventricular ejection fraction, by estimation, is 60 to 65%. The left ventricle has normal function. The left ventricle has no regional wall motion abnormalities. The left ventricular internal cavity size was normal in size. There is  no left ventricular hypertrophy. Left ventricular diastolic parameters were normal. Right Ventricle: The right ventricular size is normal. No increase in right ventricular wall thickness. Right ventricular systolic function is normal. Left Atrium: Left atrial size was normal in size. Right Atrium: Right atrial size was normal in size. Pericardium: There is no evidence of pericardial effusion. Mitral Valve: The mitral valve is normal in structure. No evidence of mitral valve regurgitation. Tricuspid Valve: The tricuspid valve is normal in structure. Tricuspid valve regurgitation is  trivial. No evidence of tricuspid stenosis. Aortic Valve: The aortic valve is tricuspid. Aortic valve regurgitation is not visualized. No aortic stenosis is present. Aortic valve mean gradient measures 4.0 mmHg. Aortic valve peak gradient measures 7.7 mmHg. Aortic valve area, by VTI measures 2.44 cm. Pulmonic Valve: The pulmonic valve was grossly normal. Pulmonic valve regurgitation is trivial. No evidence of pulmonic stenosis. Aorta: The aortic root is normal in size and structure. IAS/Shunts: The atrial septum is grossly normal.  LEFT VENTRICLE PLAX 2D LVIDd:         4.90 cm  Diastology LVIDs:         3.30 cm  LV e' medial:    8.05 cm/s LV PW:         0.90 cm  LV E/e' medial:  10.5 LV IVS:        0.90 cm  LV e' lateral:   12.00 cm/s LVOT diam:     2.00 cm  LV E/e' lateral: 7.1 LV SV:         70 LV SV Index:   41 LVOT Area:     3.14 cm  RIGHT VENTRICLE RV Basal diam:  3.60 cm RV S prime:     11.70 cm/s TAPSE (M-mode): 2.3 cm LEFT ATRIUM             Index       RIGHT ATRIUM           Index LA diam:        3.70 cm 2.16 cm/m  RA Area:     12.90 cm LA Vol (A2C):   38.0 ml 22.21 ml/m RA Volume:   33.00 ml  19.29 ml/m LA Vol (A4C):   43.1 ml 25.19 ml/m LA Biplane Vol: 42.9 ml 25.07 ml/m  AORTIC VALVE AV Area (Vmax):    2.67 cm AV Area (Vmean):   2.53 cm AV Area (VTI):     2.44 cm AV Vmax:           139.00 cm/s AV Vmean:          88.200 cm/s AV VTI:            0.288 m AV Peak Grad:      7.7 mmHg AV Mean Grad:      4.0 mmHg LVOT Vmax:         118.00 cm/s LVOT Vmean:        71.000 cm/s LVOT VTI:          0.224 m  LVOT/AV VTI ratio: 0.78  AORTA Ao Root diam: 2.70 cm MITRAL VALVE               TRICUSPID VALVE MV Area (PHT): 5.84 cm    TR Peak grad:   38.7 mmHg MV Decel Time: 130 msec    TR Vmax:        311.00 cm/s MV E velocity: 84.80 cm/s MV A velocity: 47.60 cm/s  SHUNTS MV E/A ratio:  1.78        Systemic VTI:  0.22 m                            Systemic Diam: 2.00 cm Riley Lam MD Electronically signed  by Riley Lam MD Signature Date/Time: 01/16/2021/5:00:40 PM    Final    CT VENOGRAM ABD/PEL  Result Date: 01/15/2021 CLINICAL DATA:  55 year old female with pulmonary emboli and acute left lower extremity DVT. She is having persistent and worsening left lower extremity pain and swelling despite being on anticoagulation. Evaluate for evidence of iliac vein compression syndrome. EXAM: CT VENOGRAM ABDOMEN AND PELVIS TECHNIQUE: CT of the abdomen and pelvis obtained following administration of intravenous contrast. Vascular timing optimized for venous phase imaging. CONTRAST:  OMNIPAQUE IOHEXOL 350 MG/ML SOLN COMPARISON:  CT abdomen/pelvis 07/05/2019 FINDINGS: VASCULAR IVC: Widely patent IVC. No evidence of IVC duplication or other congenital anomaly. Hepatic/portal veins: Widely patent. Renal veins: Widely patent.  Normal anatomy. Visceral veins: IMV and SMV are widely patent and unremarkable. Iliac veins: Bilateral common, internal and external iliac veins are widely patent. No evidence of iliac vein compression physiology. Femoral veins: Marked expansion of the left common femoral vein with central filling defect consistent with DVT. DVT extends into the profunda and femoral vein as well as just into the saphenofemoral junction. There is associated inflammatory stranding within the perivenous fat. The right femoral veins are patent and unremarkable. NON VASCULAR Lower chest: Small left pleural effusion with associated left lower lobe atelectasis. No evidence of pulmonary infarction. Limited visualization of the left lower lobe pulmonary arteries demonstrates some residual linear filling defects consistent with evolving pulmonary emboli. The heart is normal in size. No evidence of right heart strain. Hepatobiliary: Geographic hypoattenuation in the left hemi-liver adjacent to the fissure for the falciform ligament is nonspecific but most suggestive of benign focal fatty infiltration. Normal hepatic  contour and morphology. No discrete hepatic lesions. Normal appearance of the gallbladder. No intra or extrahepatic biliary ductal dilatation. Pancreas: No mass or inflammatory changes Spleen: Normal size and morphology. Genitourinary: Normal adrenal glands. No evidence of hydronephrosis, nephrolithiasis or enhancing renal mass. The ureters and kidneys are also unremarkable. The bladder is normal. Bowel: No evidence of obstruction or focal bowel wall thickening. The terminal ileum is unremarkable. Reproductive: Normal uterus and adnexa.  No adnexal mass. Other: No significant ascites or abdominal wall hernia. Edema present in the left upper thigh and inguinal region. Musculoskeletal: No acute fracture or aggressive appearing lytic or blastic osseous lesion. IMPRESSION: 1. No evidence of iliac vein compression physiology or ileo caval DVT. 2. Positive for occlusive DVT in the left common femoral vein, femoral vein, profunda femoral vein and saphenofemoral junction with associated left upper thigh edema. 3. Small left pleural effusion with associated left lower lobe atelectasis. 4. Expected evolution of pulmonary emboli within the visualized pulmonary arteries. No evidence of right heart strain. Signed, Sterling Big, MD, RPVI [facility] Electronically  Signed   By: Malachy Moan M.D.   On: 01/15/2021 16:31    Assessment/Plan: LLE DVT thrombectomy and PTA of CFV Clinical improvement Continue anticoagulation DC ACE wrap and switch to knee high TED hose. Encourage OOB/ambulating OK to DC from IR standpoint, we will arrange outpt clinic follow up.     LOS: 9 days   I spent a total of 15 minutes in face to face in clinical consultation, greater than 50% of which was counseling/coordinating care for LLE DVT thrombectomy  Brayton El PA-C 01/17/2021 11:18 AM

## 2021-01-18 ENCOUNTER — Other Ambulatory Visit (HOSPITAL_COMMUNITY): Payer: Self-pay

## 2021-01-18 MED ORDER — FOLIC ACID 1 MG PO TABS: 1.0000 mg | ORAL_TABLET | Freq: Every day | ORAL | 0 refills | Status: DC

## 2021-01-18 MED ORDER — NICOTINE 21 MG/24HR TD PT24: 21.0000 mg | MEDICATED_PATCH | Freq: Every day | TRANSDERMAL | 0 refills | Status: DC

## 2021-01-18 MED ORDER — SERTRALINE HCL 50 MG PO TABS: 50.0000 mg | ORAL_TABLET | Freq: Every day | ORAL | 2 refills | Status: DC

## 2021-01-18 MED ORDER — ADULT MULTIVITAMIN W/MINERALS CH: 1.0000 | ORAL_TABLET | Freq: Every day | ORAL | 0 refills | Status: DC

## 2021-01-18 MED ORDER — OXYCODONE-ACETAMINOPHEN 5-325 MG PO TABS: 1.0000 | ORAL_TABLET | Freq: Four times a day (QID) | ORAL | 0 refills | Status: AC | PRN

## 2021-01-18 MED ORDER — LAMOTRIGINE 25 MG PO TABS: 25.0000 mg | ORAL_TABLET | Freq: Every day | ORAL | 0 refills | Status: DC

## 2021-01-18 MED ORDER — BUSPIRONE HCL 10 MG PO TABS: 10.0000 mg | ORAL_TABLET | Freq: Three times a day (TID) | ORAL | 1 refills | Status: DC

## 2021-01-18 MED ORDER — POLYETHYLENE GLYCOL 3350 17 G PO PACK: 17.0000 g | PACK | Freq: Every day | ORAL | 0 refills | Status: DC | PRN

## 2021-01-18 MED ORDER — ARIPIPRAZOLE 10 MG PO TABS: 10.0000 mg | ORAL_TABLET | Freq: Every day | ORAL | 0 refills | Status: DC

## 2021-01-18 MED ORDER — APIXABAN 5 MG PO TABS: 5.0000 mg | ORAL_TABLET | Freq: Two times a day (BID) | ORAL | 6 refills | Status: DC

## 2021-01-18 NOTE — Discharge Summary (Addendum)
Physician Discharge Summary  [facility] WGN:[id number] DOB: [birth year] DOA: 01/08/2021  PCP: Haywood Filler., FNP  Admit date: 01/08/2021 Discharge date: 01/18/2021  Admitted From:  Homeless Disposition: [place]   Recommendations for Outpatient Follow-up:  Follow up with PCP in 1-2 weeks Please obtain BMP/CBC in one week Needs further evaluation of cause of PE, DVT. Needs appropriate screening for malignancy.    Discharge Condition:Stable.  CODE STATUS:Full Code Diet recommendation: Heart Healthy   Brief/Interim Summary: 55 year old with past medical history significant for anxiety, depression, GERD, bipolar, polysubstance abuse, alcohol abuse presented to ED with 2 weeks history of left leg pain.  Patient mostly recently used crack cocaine and methamphetamine and alcohol today prior to admission.  Patient was found to have elevated D-dimer at 1.3, bilateral lower extremity duplex: Positive for acute DVT involving left common femoral, left femoral, popliteal, posterior tibial, peroneal, soleal and gastrocnemius veins.  CTA chest showed multiple bilateral PE including saddle embolus with CT evidence of right heart strain. Patient was admitted for DVT and PE started on Lovenox.  She was subsequently transition to Eliquis.  She is spiking fever, and treated with IV antibiotics for UTI.      1-Acute unprovoked Left Lower extremity DVT/acute saddle PE with right heart strain by CT: -Patient was initially on Lovenox.  Subsequently transition to Eliquis 6/09. -Without Cor pulmonale.  -CTA;  Shows multiple bilateral PE including saddle embolus, and the bifurcation of the right pulmonary artery, saddle embolus at the bifurcation of right middle and right lower lobe pulmonary arteries and within bilateral lower lobes and right middle lobe. -Doppler lower extremity: Acute deep vein thrombosis involving the left common femoral vein, left femoral vein, left popliteal vein, left  posterior tibial veins, left peroneal veins, left soleal veins and left gastrocnemius veins. -New onset chest pain  pleuritic on 6/14.  Suspect related to atelectasis/PNA  Patient vitals has remained stable.  Oxygen saturation 97 on room air, blood pressure in the normal range.  Troponins x2 negative. -ECHO Normal RV function. -Worsening lower extremity edema 6/14, IR reconsulted for evaluation, had  CT venogram abdomen and pelvis. -incentive spirometry. -Underwent catheter directed mechanical thrombectomy with TPA of common femoral vein by IR 6/15. -Continue with eliquis. . Will need Hypercoagulable work up and screening malignancy.  -Stable. Plan to discharge today to [place].   -will provide short course of opioids, 3 days.   2-Sepsis, Secondary to UTI/pneumonia: POA Met sepsis criteria with leukocytosis, tachycardia, tachypnea. Treated initially with ceftriaxone and azithromycin. Received ceftriaxone. Completed 7 days tx   3-polysubstance abuse: Counseling provided   Alcohol abuse, suspected alcohol withdrawal: She was monitored on CIWA protocol   Depression disorder, bipolar: Medication resume Lamictal BuSpar Zoloft and Abilify   Tobacco use counseling provided. . Hypokalemia; replaced.      Estimated body mass index is 26.57 kg/m as calculated from the following:   Height as of this encounter: _0  (1.6 m).   Weight as of this encounter: 68 kg.          Discharge Diagnoses:  Principal Problem:   Bipolar disorder, in full remission, most recent episode depressed ([place]) Active Problems:   Generalized anxiety disorder   Alcohol use disorder, severe, in early remission, dependence ([place])   Pulmonary embolism ([place])   DVT (deep venous thrombosis) ([facility])   Polysubstance (excluding opioids) dependence ([place])   Alcohol abuse with withdrawal [facility])    Discharge Instructions  Discharge Instructions     Diet -  low sodium heart healthy   Complete by: As directed     Diet - low sodium heart healthy   Complete by: As directed    Discharge wound care:   Complete by: As directed    Discharge wound care:   Complete by: As directed    See above   Increase activity slowly   Complete by: As directed    Increase activity slowly   Complete by: As directed       Allergies as of 01/18/2021   No Known Allergies      Medication List     STOP taking these medications    benzonatate 200 MG capsule Commonly known as: TESSALON   cephALEXin 500 MG capsule Commonly known as: KEFLEX   HYDROcodone-acetaminophen 5-325 MG tablet Commonly known as: Norco       TAKE these medications    apixaban 5 MG Tabs tablet Commonly known as: ELIQUIS Take 1 tablet (5 mg total) by mouth 2 (two) times daily.   ARIPiprazole 10 MG tablet Commonly known as: ABILIFY Take 1 tablet (10 mg total) by mouth daily. Start taking on: January 19, 2021 What changed: See the new instructions.   busPIRone 10 MG tablet Commonly known as: BUSPAR Take 1 tablet (10 mg total) by mouth 3 (three) times daily. What changed: See the new instructions.   folic acid 1 MG tablet Commonly known as: FOLVITE Take 1 tablet (1 mg total) by mouth daily. Start taking on: January 19, 2021   lamoTRIgine 25 MG tablet Commonly known as: LAMICTAL Take 1 tablet (25 mg total) by mouth daily. Start taking on: January 19, 2021 What changed:  medication strength how much to take when to take this   multivitamin with minerals Tabs tablet Take 1 tablet by mouth daily. Start taking on: January 19, 2021   nicotine 21 mg/24hr patch Commonly known as: NICODERM CQ - dosed in mg/24 hours Place 1 patch (21 mg total) onto the skin daily. Start taking on: January 19, 2021   oxyCODONE-acetaminophen 5-325 MG tablet Commonly known as: PERCOCET/ROXICET Take 1-2 tablets by mouth every 6 (six) hours as needed for up to 3 days for moderate pain.   polyethylene glycol 17 g packet Commonly known as: MIRALAX /  GLYCOLAX Take 17 g by mouth daily as needed for mild constipation.   sertraline 50 MG tablet Commonly known as: Zoloft Take 1 tablet (50 mg total) by mouth daily.               Discharge Care Instructions  (From admission, onward)           Start     Ordered   01/18/21 0000  Discharge wound care:        01/18/21 1053   01/18/21 0000  Discharge wound care:       Comments: See above   01/18/21 [place]. Go on 02/25/2021.   Specialty: Internal Medicine Why: @ 9:20a bring photo ID, discharge papers. Contact information: [phone] [place] [address] [phone]        Dr. Nevada Crane. Schedule an appointment as soon as possible for a visit.   Contact information: [place] [facility] [place] [address] [place] [address] [place]        Criselda Peaches, MD. Daphane Shepherd  in 3 week(s).   Specialties: Interventional Radiology, Radiology Why: Clinic will call you to schedule follow up appointment. Contact information: [address] [place] [address] [phone]                No Known Allergies  Consultations: IR   Procedures/Studies: CT Angio Chest PE W/Cm &/Or Wo Cm  Result Date: 01/08/2021 CLINICAL DATA:  Positive for DVT today, leg pain, clinically suspected pulmonary embolism EXAM: CT ANGIOGRAPHY CHEST WITH CONTRAST TECHNIQUE: Multidetector CT imaging of the chest was performed using the standard protocol during bolus administration of intravenous contrast. Multiplanar CT image reconstructions and MIPs were obtained to evaluate the vascular anatomy. CONTRAST:  67m OMNIPAQUE IOHEXOL 350 MG/ML SOLN IV COMPARISON:  None FINDINGS: Cardiovascular: Aorta normal caliber without aneurysm or dissection. Heart unremarkable. No pericardial effusion. Pulmonary arteries adequately opacified. Multiple  filling defects are seen within the pulmonary arteries bilaterally consistent with pulmonary emboli. These include saddle embolus at the bifurcation of the RIGHT pulmonary artery, saddle embolus at bifurcation of RIGHT middle and RIGHT lower lobe pulmonary arteries, and within BILATERAL lower lobes and RIGHT middle lobe. RV/LV ratio = 1.01, elevated, consistent with RIGHT heart strain. Mediastinum/Nodes: Esophagus unremarkable. Base of cervical region normal appearance. No thoracic adenopathy. Lungs/Pleura: Scattered atelectasis. Small foci of infiltrate RIGHT upper lobe laterally and in lingula. No additional consolidation, pleural effusion, or pneumothorax. Upper Abdomen: Visualized upper abdomen unremarkable Musculoskeletal: No acute osseous findings. Review of the MIP images confirms the above findings. IMPRESSION: Multiple BILATERAL pulmonary emboli including saddle embolus at the bifurcation of the RIGHT pulmonary artery, saddle embolus at bifurcation of RIGHT middle and RIGHT lower lobe pulmonary arteries, and within BILATERAL lower lobes and RIGHT middle lobe. Positive for acute PE with CT evidence of right heart strain (RV/LV Ratio = 1.01) consistent with at least submassive (intermediate risk) PE. The presence of right heart strain has been associated with an increased risk of morbidity and mortality. Please refer to the "PE Focused" order set in EPIC. Scattered atelectasis with small foci of infiltrate in RIGHT upper lobe laterally and in lingula. Critical Value/emergent results were called by telephone at the time of interpretation on 01/08/2021 at 10:52 am to provider MAletta EdouardMD, who verbally acknowledged these results. Electronically Signed   By: MLavonia DanaM.D.   On: 01/08/2021 10:53   IR Veno/Ext/Uni Left  Result Date: 01/16/2021 INDICATION: 55year old female with acute left lower extremity femoropopliteal and calf vein DVT. She has been appropriately anticoagulated but continues to have  progressive left lower extremity pain and swelling. Therefore, she is a candidate for percutaneous catheter directed thrombectomy. EXAM: THROMBOECTOMY MECHANICAL VENOUS; LEFT EXTREMITY VENOGRAPHY; IR ULTRASOUND GUIDANCE VASC ACCESS LEFT; IR PTA VENOUS EXCEPT DIALYSIS CIRCUIT; INFERIOR VENA CAVOGRAM COMPARISON:  CT venogram 01/15/2021 MEDICATIONS: 25 mg Benadryl, 5000 units heparin, 75 mL Omnipaque 300 ANESTHESIA/SEDATION: Versed 6 mg IV; Fentanyl 300s mcg IV Moderate Sedation Time:  86 minutes The patient was continuously monitored during the procedure by the interventional radiology nurse under my direct supervision. FLUOROSCOPY TIME: Fluoroscopy Time: 12 minutes 42 seconds (113 mGy). COMPLICATIONS: None immediate. TECHNIQUE: Informed written consent was obtained from the patient after a thorough discussion of the procedural risks, benefits and alternatives. All questions were addressed. Maximal Sterile Barrier Technique was utilized including caps, mask, sterile gowns, sterile gloves, sterile drape, hand hygiene and skin antiseptic. A timeout was performed prior to the initiation of the procedure. The popliteal vein was interrogated with ultrasound and found  to be completely thrombosed. An image was obtained and stored for the medical record. Local anesthesia was attained by infiltration with 1% lidocaine. A small dermatotomy was made. Under real-time sonographic guidance, the vessel was punctured with a 21 gauge micropuncture needle. Using standard technique, the initial micro needle was exchanged over a 0.018 micro wire for a transitional 4 [place] micro sheath. The micro sheath was then exchanged over a 0.035 wire for a 6 French vascular sheath. A 100 cm angled vert catheter and Glidewire were then used to advanced into the left external iliac vein. Venography was performed. Patent external iliac vein. Duplicated common iliac vein. The IVC is widely patent. Nonocclusive thrombus is present within the external  iliac vein. A pull-back venogram demonstrates significant occlusive thrombus in the common femoral vein, femoral vein and popliteal vein. Next, the catheter and wire combination were navigated up into the left subclavian vein. The glidewire was then exchanged for a 260 cm superstiff Amplatz wire. Initial angioplasty of the popliteal vein and distal femoral vein was performed utilizing an 8 x 40 mm Conquest balloon. This was performed in order to macerate and soften the thrombus to allow full expansion of the recovery cone of the Inari sheath device. Following angioplasty of the expected intravenous landing zone, the percutaneous tract was serially dilated to 16 [place]. The 21 [place] recovery cone sheath was then advanced over the wire and positioned in the popliteal vein. The recovery cone was successfully deployed. The [place] ClotTriever device was then advanced over the wire into the common iliac vein. Mechanical thrombectomy with extirpation of acute, subacute and chronic thrombus was then performed throughout the external iliac vein, common femoral, femoral and popliteal veins. A multiple passes were taken with successful extirpation of a large volume of thrombus of varying chronicity. A 55 cm 6 [place] vascular sheath was then advanced coaxially over the wire through the 16 [place] Inari sheath. Left lower extremity venography was again performed. Restoration of patency of the venous system. There is a small amount of residual thrombus as well as chronic narrowing in the common femoral vein. Therefore, the sheath was removed over the wire. A 12 x 40 mm Conquest balloon was then advanced into the region of stenosis and angioplasty performed. The balloon was inflated to full effacement and maintained for 90 seconds. One final pass with the ClotTriever device was then performed through the area extirpating a small amount of residual chronic thrombus. Final venography a through the sheath demonstrates restored  patency and no significant residual stenosis or thrombus. Outflow is slow secondary to a paucity of inflow from the occluded calf veins. The sheath was then removed and hemostasis attained with the assistance of a pursestring suture using 0 silk suture. FINDINGS: 1. Large volume thrombus within the left external iliac, common femoral, femoral and popliteal veins. 2. 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 IMPRESSION: 1. Successful catheter directed mechanical thrombectomy with extirpation of a mix of acute, subacute and chronic thrombus. 2. Successful balloon angioplasty of common femoral venous stenosis to 12 mm. PLAN: 1. Bed rest with left leg straight for 2 hours. 2. Apply Ace bandage compression to left lower extremity. 3. Encourage ambulation beginning this evening or tomorrow morning. 4. Continue anticoagulation. Signed, Criselda Peaches, MD, [place] [facility] Radiology Electronically Signed   By: Jacqulynn Cadet M.D.   On: 01/16/2021 13:16   IR Venocavagram Ivc  Result Date: 01/16/2021 INDICATION: 55 year old female with acute left lower  extremity femoropopliteal and calf vein DVT. She has been appropriately anticoagulated but continues to have progressive left lower extremity pain and swelling. Therefore, she is a candidate for percutaneous catheter directed thrombectomy. EXAM: THROMBOECTOMY MECHANICAL VENOUS; LEFT EXTREMITY VENOGRAPHY; IR ULTRASOUND GUIDANCE VASC ACCESS LEFT; IR PTA VENOUS EXCEPT DIALYSIS CIRCUIT; INFERIOR VENA CAVOGRAM COMPARISON:  CT venogram 01/15/2021 MEDICATIONS: 25 mg Benadryl, 5000 units heparin, 75 mL Omnipaque 300 ANESTHESIA/SEDATION: Versed 6 mg IV; Fentanyl 300s mcg IV Moderate Sedation Time:  86 minutes The patient was continuously monitored during the procedure by the interventional radiology nurse under my direct supervision. FLUOROSCOPY TIME: Fluoroscopy Time: 12 minutes 42 seconds (113 mGy). COMPLICATIONS:  None immediate. TECHNIQUE: Informed written consent was obtained from the patient after a thorough discussion of the procedural risks, benefits and alternatives. All questions were addressed. Maximal Sterile Barrier Technique was utilized including caps, mask, sterile gowns, sterile gloves, sterile drape, hand hygiene and skin antiseptic. A timeout was performed prior to the initiation of the procedure. The popliteal vein was interrogated with ultrasound and found to be completely thrombosed. An image was obtained and stored for the medical record. Local anesthesia was attained by infiltration with 1% lidocaine. A small dermatotomy was made. Under real-time sonographic guidance, the vessel was punctured with a 21 gauge micropuncture needle. Using standard technique, the initial micro needle was exchanged over a 0.018 micro wire for a transitional 4 [place] micro sheath. The micro sheath was then exchanged over a 0.035 wire for a 6 French vascular sheath. A 100 cm angled vert catheter and Glidewire were then used to advanced into the left external iliac vein. Venography was performed. Patent external iliac vein. Duplicated common iliac vein. The IVC is widely patent. Nonocclusive thrombus is present within the external iliac vein. A pull-back venogram demonstrates significant occlusive thrombus in the common femoral vein, femoral vein and popliteal vein. Next, the catheter and wire combination were navigated up into the left subclavian vein. The glidewire was then exchanged for a 260 cm superstiff Amplatz wire. Initial angioplasty of the popliteal vein and distal femoral vein was performed utilizing an 8 x 40 mm Conquest balloon. This was performed in order to macerate and soften the thrombus to allow full expansion of the recovery cone of the Inari sheath device. Following angioplasty of the expected intravenous landing zone, the percutaneous tract was serially dilated to 16 [place]. The 33 [place] recovery cone  sheath was then advanced over the wire and positioned in the popliteal vein. The recovery cone was successfully deployed. The Inari ClotTriever device was then advanced over the wire into the common iliac vein. Mechanical thrombectomy with extirpation of acute, subacute and chronic thrombus was then performed throughout the external iliac vein, common femoral, femoral and popliteal veins. A multiple passes were taken with successful extirpation of a large volume of thrombus of varying chronicity. A 55 cm 6 [place] vascular sheath was then advanced coaxially over the wire through the 16 [place] Inari sheath. Left lower extremity venography was again performed. Restoration of patency of the venous system. There is a small amount of residual thrombus as well as chronic narrowing in the common femoral vein. Therefore, the sheath was removed over the wire. A 12 x 40 mm Conquest balloon was then advanced into the region of stenosis and angioplasty performed. The balloon was inflated to full effacement and maintained for 90 seconds. One final pass with the ClotTriever device was then performed through the area extirpating a small amount of residual chronic thrombus.  Final venography a through the sheath demonstrates restored patency and no significant residual stenosis or thrombus. Outflow is slow secondary to a paucity of inflow from the occluded calf veins. The sheath was then removed and hemostasis attained with the assistance of a pursestring suture using 0 silk suture. FINDINGS: 1. Large volume thrombus within the left external iliac, common femoral, femoral and popliteal veins. 2. 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 IMPRESSION: 1. Successful catheter directed mechanical thrombectomy with extirpation of a mix of acute, subacute and chronic thrombus. 2. Successful balloon angioplasty of common femoral venous stenosis to 12 mm. PLAN: 1. Bed rest with left leg straight for 2 hours. 2. Apply Ace bandage  compression to left lower extremity. 3. Encourage ambulation beginning this evening or tomorrow morning. 4. Continue anticoagulation. Signed, Criselda Peaches, MD, [place] [facility] Radiology Electronically Signed   By: Jacqulynn Cadet M.D.   On: 01/16/2021 13:16   IR THROMBECT VENO MECH MOD SED  Result Date: 01/16/2021 INDICATION: 55 year old female with acute left lower extremity femoropopliteal and calf vein DVT. She has been appropriately anticoagulated but continues to have progressive left lower extremity pain and swelling. Therefore, she is a candidate for percutaneous catheter directed thrombectomy. EXAM: THROMBOECTOMY MECHANICAL VENOUS; LEFT EXTREMITY VENOGRAPHY; IR ULTRASOUND GUIDANCE VASC ACCESS LEFT; IR PTA VENOUS EXCEPT DIALYSIS CIRCUIT; INFERIOR VENA CAVOGRAM COMPARISON:  CT venogram 01/15/2021 MEDICATIONS: 25 mg Benadryl, 5000 units heparin, 75 mL Omnipaque 300 ANESTHESIA/SEDATION: Versed 6 mg IV; Fentanyl 300s mcg IV Moderate Sedation Time:  86 minutes The patient was continuously monitored during the procedure by the interventional radiology nurse under my direct supervision. FLUOROSCOPY TIME: Fluoroscopy Time: 12 minutes 42 seconds (113 mGy). COMPLICATIONS: None immediate. TECHNIQUE: Informed written consent was obtained from the patient after a thorough discussion of the procedural risks, benefits and alternatives. All questions were addressed. Maximal Sterile Barrier Technique was utilized including caps, mask, sterile gowns, sterile gloves, sterile drape, hand hygiene and skin antiseptic. A timeout was performed prior to the initiation of the procedure. The popliteal vein was interrogated with ultrasound and found to be completely thrombosed. An image was obtained and stored for the medical record. Local anesthesia was attained by infiltration with 1% lidocaine. A small dermatotomy was made. Under real-time sonographic guidance, the vessel  was punctured with a 21 gauge micropuncture needle. Using standard technique, the initial micro needle was exchanged over a 0.018 micro wire for a transitional 4 [place] micro sheath. The micro sheath was then exchanged over a 0.035 wire for a 6 French vascular sheath. A 100 cm angled vert catheter and Glidewire were then used to advanced into the left external iliac vein. Venography was performed. Patent external iliac vein. Duplicated common iliac vein. The IVC is widely patent. Nonocclusive thrombus is present within the external iliac vein. A pull-back venogram demonstrates significant occlusive thrombus in the common femoral vein, femoral vein and popliteal vein. Next, the catheter and wire combination were navigated up into the left subclavian vein. The glidewire was then exchanged for a 260 cm superstiff Amplatz wire. Initial angioplasty of the popliteal vein and distal femoral vein was performed utilizing an 8 x 40 mm Conquest balloon. This was performed in order to macerate and soften the thrombus to allow full expansion of the recovery cone of the Inari sheath device. Following angioplasty of the expected intravenous landing zone, the percutaneous tract was serially dilated to 16 [place]. The [place] recovery cone sheath was then advanced  over the wire and positioned in the popliteal vein. The recovery cone was successfully deployed. The Inari ClotTriever device was then advanced over the wire into the common iliac vein. Mechanical thrombectomy with extirpation of acute, subacute and chronic thrombus was then performed throughout the external iliac vein, common femoral, femoral and popliteal veins. A multiple passes were taken with successful extirpation of a large volume of thrombus of varying chronicity. A 55 cm 6 [place] vascular sheath was then advanced coaxially over the wire through the 16 [place] Inari sheath. Left lower extremity venography was again performed. Restoration of patency of the  venous system. There is a small amount of residual thrombus as well as chronic narrowing in the common femoral vein. Therefore, the sheath was removed over the wire. A 12 x 40 mm Conquest balloon was then advanced into the region of stenosis and angioplasty performed. The balloon was inflated to full effacement and maintained for 90 seconds. One final pass with the ClotTriever device was then performed through the area extirpating a small amount of residual chronic thrombus. Final venography a through the sheath demonstrates restored patency and no significant residual stenosis or thrombus. Outflow is slow secondary to a paucity of inflow from the occluded calf veins. The sheath was then removed and hemostasis attained with the assistance of a pursestring suture using 0 silk suture. FINDINGS: 1. Large volume thrombus within the left external iliac, common femoral, femoral and popliteal veins. 2. 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 IMPRESSION: 1. Successful catheter directed mechanical thrombectomy with extirpation of a mix of acute, subacute and chronic thrombus. 2. Successful balloon angioplasty of common femoral venous stenosis to 12 mm. PLAN: 1. Bed rest with left leg straight for 2 hours. 2. Apply Ace bandage compression to left lower extremity. 3. Encourage ambulation beginning this evening or tomorrow morning. 4. Continue anticoagulation. Signed, Criselda Peaches, MD, [facility] Radiology Electronically Signed   By: Jacqulynn Cadet M.D.   On: 01/16/2021 13:16   IR US Guide Vasc Access Left  Result Date: 01/16/2021 INDICATION: 55 year old female with acute left lower extremity femoropopliteal and calf vein DVT. She has been appropriately anticoagulated but continues to have progressive left lower extremity pain and swelling. Therefore, she is a candidate for percutaneous catheter directed thrombectomy. EXAM: THROMBOECTOMY MECHANICAL  VENOUS; LEFT EXTREMITY VENOGRAPHY; IR ULTRASOUND GUIDANCE VASC ACCESS LEFT; IR PTA VENOUS EXCEPT DIALYSIS CIRCUIT; INFERIOR VENA CAVOGRAM COMPARISON:  CT venogram 01/15/2021 MEDICATIONS: 25 mg Benadryl, 5000 units heparin, 75 mL Omnipaque 300 ANESTHESIA/SEDATION: Versed 6 mg IV; Fentanyl 300s mcg IV Moderate Sedation Time:  86 minutes The patient was continuously monitored during the procedure by the interventional radiology nurse under my direct supervision. FLUOROSCOPY TIME: Fluoroscopy Time: 12 minutes 42 seconds (113 mGy). COMPLICATIONS: None immediate. TECHNIQUE: Informed written consent was obtained from the patient after a thorough discussion of the procedural risks, benefits and alternatives. All questions were addressed. Maximal Sterile Barrier Technique was utilized including caps, mask, sterile gowns, sterile gloves, sterile drape, hand hygiene and skin antiseptic. A timeout was performed prior to the initiation of the procedure. The popliteal vein was interrogated with ultrasound and found to be completely thrombosed. An image was obtained and stored for the medical record. Local anesthesia was attained by infiltration with 1% lidocaine. A small dermatotomy was made. Under real-time sonographic guidance, the vessel was punctured with a 21 gauge micropuncture needle. Using standard technique, the initial micro needle was exchanged over a 0.018 micro  wire for a transitional 4 [place] micro sheath. The micro sheath was then exchanged over a 0.035 wire for a 6 French vascular sheath. A 100 cm angled vert catheter and Glidewire were then used to advanced into the left external iliac vein. Venography was performed. Patent external iliac vein. Duplicated common iliac vein. The IVC is widely patent. Nonocclusive thrombus is present within the external iliac vein. A pull-back venogram demonstrates significant occlusive thrombus in the common femoral vein, femoral vein and popliteal vein. Next, the catheter and  wire combination were navigated up into the left subclavian vein. The glidewire was then exchanged for a 260 cm superstiff Amplatz wire. Initial angioplasty of the popliteal vein and distal femoral vein was performed utilizing an 8 x 40 mm Conquest balloon. This was performed in order to macerate and soften the thrombus to allow full expansion of the recovery cone of the Inari sheath device. Following angioplasty of the expected intravenous landing zone, the percutaneous tract was serially dilated to 16 [place]. The 7 [place] recovery cone sheath was then advanced over the wire and positioned in the popliteal vein. The recovery cone was successfully deployed. The Inari ClotTriever device was then advanced over the wire into the common iliac vein. Mechanical thrombectomy with extirpation of acute, subacute and chronic thrombus was then performed throughout the external iliac vein, common femoral, femoral and popliteal veins. A multiple passes were taken with successful extirpation of a large volume of thrombus of varying chronicity. A 55 cm 6 [place] vascular sheath was then advanced coaxially over the wire through the 16 [place] Inari sheath. Left lower extremity venography was again performed. Restoration of patency of the venous system. There is a small amount of residual thrombus as well as chronic narrowing in the common femoral vein. Therefore, the sheath was removed over the wire. A 12 x 40 mm Conquest balloon was then advanced into the region of stenosis and angioplasty performed. The balloon was inflated to full effacement and maintained for 90 seconds. One final pass with the ClotTriever device was then performed through the area extirpating a small amount of residual chronic thrombus. Final venography a through the sheath demonstrates restored patency and no significant residual stenosis or thrombus. Outflow is slow secondary to a paucity of inflow from the occluded calf veins. The sheath was then  removed and hemostasis attained with the assistance of a pursestring suture using 0 silk suture. FINDINGS: 1. Large volume thrombus within the left external iliac, common femoral, femoral and popliteal veins. 2. 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 IMPRESSION: 1. Successful catheter directed mechanical thrombectomy with extirpation of a mix of acute, subacute and chronic thrombus. 2. Successful balloon angioplasty of common femoral venous stenosis to 12 mm. PLAN: 1. Bed rest with left leg straight for 2 hours. 2. Apply Ace bandage compression to left lower extremity. 3. Encourage ambulation beginning this evening or tomorrow morning. 4. Continue anticoagulation. Signed, Criselda Peaches, MD, [facility] Radiology Electronically Signed   By: Jacqulynn Cadet M.D.   On: 01/16/2021 13:16   DG CHEST PORT 1 VIEW  Result Date: 01/14/2021 CLINICAL DATA:  Pneumonia, left chest pain EXAM: PORTABLE CHEST 1 VIEW COMPARISON:  Radiograph 01/11/2021 FINDINGS: Persistent consolidative opacity in the left lung base, increased from prior with some increasingly conspicuous opacity in the periphery of the right lower lung as well. Suspect small left and trace right effusion. No pneumothorax. Cardiomediastinal contours are stable. No acute osseous or  soft tissue abnormality. Telemetry leads overlie the chest. IMPRESSION: Increasing consolidation in the left lung base and periphery of the right lower lobe, concerning for worsening airspace disease and/or atelectasis. Electronically Signed   By: Lovena Le M.D.   On: 01/14/2021 05:57   DG CHEST PORT 1 VIEW  Result Date: 01/11/2021 CLINICAL DATA:  Pneumonia. EXAM: PORTABLE CHEST 1 VIEW COMPARISON:  None. FINDINGS: The heart size and mediastinal contours are within normal limits. Right lung is clear. Mild left basilar atelectasis or infiltrate is noted. The visualized skeletal structures are unremarkable.  IMPRESSION: Mild left basilar atelectasis or infiltrate. Electronically Signed   By: Marijo Conception M.D.   On: 01/11/2021 11:54   DG Foot Complete Left  Result Date: 01/01/2021 CLINICAL DATA:  Left ankle and foot pain, worse with flexion extension EXAM: LEFT FOOT - COMPLETE 3+ VIEW COMPARISON:  None. FINDINGS: Frontal, oblique, and lateral views of the left foot are obtained. No fracture, subluxation, or dislocation. There is mild joint space narrowing of the first metatarsophalangeal joint. Remaining joint spaces are unremarkable. The soft tissues are normal. IMPRESSION: 1. Mild osteoarthritis of the first metatarsophalangeal joint. Otherwise unremarkable left foot. Electronically Signed   By: Randa Ngo M.D.   On: 01/01/2021 02:00   IR PTA VENOUS EXCEPT DIALYSIS CIRCUIT  Result Date: 01/16/2021 INDICATION: 55 year old female with acute left lower extremity femoropopliteal and calf vein DVT. She has been appropriately anticoagulated but continues to have progressive left lower extremity pain and swelling. Therefore, she is a candidate for percutaneous catheter directed thrombectomy. EXAM: THROMBOECTOMY MECHANICAL VENOUS; LEFT EXTREMITY VENOGRAPHY; IR ULTRASOUND GUIDANCE VASC ACCESS LEFT; IR PTA VENOUS EXCEPT DIALYSIS CIRCUIT; INFERIOR VENA CAVOGRAM COMPARISON:  CT venogram 01/15/2021 MEDICATIONS: 25 mg Benadryl, 5000 units heparin, 75 mL Omnipaque 300 ANESTHESIA/SEDATION: Versed 6 mg IV; Fentanyl 300s mcg IV Moderate Sedation Time:  86 minutes The patient was continuously monitored during the procedure by the interventional radiology nurse under my direct supervision. FLUOROSCOPY TIME: Fluoroscopy Time: 12 minutes 42 seconds (113 mGy). COMPLICATIONS: None immediate. TECHNIQUE: Informed written consent was obtained from the patient after a thorough discussion of the procedural risks, benefits and alternatives. All questions were addressed. Maximal Sterile Barrier Technique was utilized including caps,  mask, sterile gowns, sterile gloves, sterile drape, hand hygiene and skin antiseptic. A timeout was performed prior to the initiation of the procedure. The popliteal vein was interrogated with ultrasound and found to be completely thrombosed. An image was obtained and stored for the medical record. Local anesthesia was attained by infiltration with 1% lidocaine. A small dermatotomy was made. Under real-time sonographic guidance, the vessel was punctured with a 21 gauge micropuncture needle. Using standard technique, the initial micro needle was exchanged over a 0.018 micro wire for a transitional 4 [place] micro sheath. The micro sheath was then exchanged over a 0.035 wire for a 6 French vascular sheath. A 100 cm angled vert catheter and Glidewire were then used to advanced into the left external iliac vein. Venography was performed. Patent external iliac vein. Duplicated common iliac vein. The IVC is widely patent. Nonocclusive thrombus is present within the external iliac vein. A pull-back venogram demonstrates significant occlusive thrombus in the common femoral vein, femoral vein and popliteal vein. Next, the catheter and wire combination were navigated up into the left subclavian vein. The glidewire was then exchanged for a 260 cm superstiff Amplatz wire. Initial angioplasty of the popliteal vein and distal femoral vein was performed utilizing an 8 x 40 mm Conquest  balloon. This was performed in order to macerate and soften the thrombus to allow full expansion of the recovery cone of the Inari sheath device. Following angioplasty of the expected intravenous landing zone, the percutaneous tract was serially dilated to 16 [place]. The 31 [place] recovery cone sheath was then advanced over the wire and positioned in the popliteal vein. The recovery cone was successfully deployed. The Inari ClotTriever device was then advanced over the wire into the common iliac vein. Mechanical thrombectomy with extirpation of  acute, subacute and chronic thrombus was then performed throughout the external iliac vein, common femoral, femoral and popliteal veins. A multiple passes were taken with successful extirpation of a large volume of thrombus of varying chronicity. A 55 cm 6 [place] vascular sheath was then advanced coaxially over the wire through the 16 [place] Inari sheath. Left lower extremity venography was again performed. Restoration of patency of the venous system. There is a small amount of residual thrombus as well as chronic narrowing in the common femoral vein. Therefore, the sheath was removed over the wire. A 12 x 40 mm Conquest balloon was then advanced into the region of stenosis and angioplasty performed. The balloon was inflated to full effacement and maintained for 90 seconds. One final pass with the ClotTriever device was then performed through the area extirpating a small amount of residual chronic thrombus. Final venography a through the sheath demonstrates restored patency and no significant residual stenosis or thrombus. Outflow is slow secondary to a paucity of inflow from the occluded calf veins. The sheath was then removed and hemostasis attained with the assistance of a pursestring suture using 0 silk suture. FINDINGS: 1. Large volume thrombus within the left external iliac, common femoral, femoral and popliteal veins. 2. 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 IMPRESSION: 1. Successful catheter directed mechanical thrombectomy with extirpation of a mix of acute, subacute and chronic thrombus. 2. Successful balloon angioplasty of common femoral venous stenosis to 12 mm. PLAN: 1. Bed rest with left leg straight for 2 hours. 2. Apply Ace bandage compression to left lower extremity. 3. Encourage ambulation beginning this evening or tomorrow morning. 4. Continue anticoagulation. Signed, Criselda Peaches, MD, [facility] Radiology Electronically  Signed   By: Jacqulynn Cadet M.D.   On: 01/16/2021 13:16   ECHOCARDIOGRAM COMPLETE  Result Date: 01/16/2021    ECHOCARDIOGRAM REPORT   Patient Name:   JAIMARIE RAPOZO Date of Exam: 01/16/2021 Medical Rec #:  [id number]         Height:       63.0 in Accession #:    [id number]        Weight:       150.0 lb Date of Birth:  [birth year]        BSA:          32.711 m Patient Age:    55 years          BP:           120/65 mmHg Patient Gender: F                 HR:           77 bpm. Exam Location:  Inpatient Procedure: 2D Echo, Cardiac Doppler and Color Doppler Indications:    Pulmonary embolus  History:        Patient has no prior history of Echocardiogram examinations.  Risk Factors:Current Smoker.  Sonographer:    Cammy Brochure Referring Phys: [phone] Diara Chaudhari A Omauri Boeve IMPRESSIONS  1. Left ventricular ejection fraction, by estimation, is 60 to 65%. The left ventricle has normal function. The left ventricle has no regional wall motion abnormalities. Left ventricular diastolic parameters were normal.  2. Right ventricular systolic function is normal. The right ventricular size is normal.  3. The mitral valve is normal in structure. No evidence of mitral valve regurgitation.  4. The aortic valve is tricuspid. Aortic valve regurgitation is not visualized. No aortic stenosis is present. Comparison(s): No prior Echocardiogram. FINDINGS  Left Ventricle: Left ventricular ejection fraction, by estimation, is 60 to 65%. The left ventricle has normal function. The left ventricle has no regional wall motion abnormalities. The left ventricular internal cavity size was normal in size. There is  no left ventricular hypertrophy. Left ventricular diastolic parameters were normal. Right Ventricle: The right ventricular size is normal. No increase in right ventricular wall thickness. Right ventricular systolic function is normal. Left Atrium: Left atrial size was normal in size. Right Atrium: Right atrial size was  normal in size. Pericardium: There is no evidence of pericardial effusion. Mitral Valve: The mitral valve is normal in structure. No evidence of mitral valve regurgitation. Tricuspid Valve: The tricuspid valve is normal in structure. Tricuspid valve regurgitation is trivial. No evidence of tricuspid stenosis. Aortic Valve: The aortic valve is tricuspid. Aortic valve regurgitation is not visualized. No aortic stenosis is present. Aortic valve mean gradient measures 4.0 mmHg. Aortic valve peak gradient measures 7.7 mmHg. Aortic valve area, by VTI measures 2.44 cm. Pulmonic Valve: The pulmonic valve was grossly normal. Pulmonic valve regurgitation is trivial. No evidence of pulmonic stenosis. Aorta: The aortic root is normal in size and structure. IAS/Shunts: The atrial septum is grossly normal.  LEFT VENTRICLE PLAX 2D LVIDd:         4.90 cm  Diastology LVIDs:         3.30 cm  LV e' medial:    8.05 cm/s LV PW:         0.90 cm  LV E/e' medial:  10.5 LV IVS:        0.90 cm  LV e' lateral:   12.00 cm/s LVOT diam:     2.00 cm  LV E/e' lateral: 7.1 LV SV:         70 LV SV Index:   41 LVOT Area:     3.14 cm  RIGHT VENTRICLE RV Basal diam:  3.60 cm RV S prime:     11.70 cm/s TAPSE (M-mode): 2.3 cm LEFT ATRIUM             Index       RIGHT ATRIUM           Index LA diam:        3.70 cm 2.16 cm/m  RA Area:     12.90 cm LA Vol (A2C):   38.0 ml 22.21 ml/m RA Volume:   33.00 ml  19.29 ml/m LA Vol (A4C):   43.1 ml 25.19 ml/m LA Biplane Vol: 42.9 ml 25.07 ml/m  AORTIC VALVE AV Area (Vmax):    2.67 cm AV Area (Vmean):   2.53 cm AV Area (VTI):     2.44 cm AV Vmax:           139.00 cm/s AV Vmean:          88.200 cm/s AV VTI:  0.288 m AV Peak Grad:      7.7 mmHg AV Mean Grad:      4.0 mmHg LVOT Vmax:         118.00 cm/s LVOT Vmean:        71.000 cm/s LVOT VTI:          0.224 m LVOT/AV VTI ratio: 0.78  AORTA Ao Root diam: 2.70 cm MITRAL VALVE               TRICUSPID VALVE MV Area (PHT): 5.84 cm    TR Peak grad:    38.7 mmHg MV Decel Time: 130 msec    TR Vmax:        311.00 cm/s MV E velocity: 84.80 cm/s MV A velocity: 47.60 cm/s  SHUNTS MV E/A ratio:  1.78        Systemic VTI:  0.22 m                            Systemic Diam: 2.00 cm Rudean Haskell MD Electronically signed by Rudean Haskell MD Signature Date/Time: 01/16/2021/5:00:40 PM    Final    CT VENOGRAM ABD/PEL  Result Date: 01/15/2021 CLINICAL DATA:  55 year old female with pulmonary emboli and acute left lower extremity DVT. She is having persistent and worsening left lower extremity pain and swelling despite being on anticoagulation. Evaluate for evidence of iliac vein compression syndrome. EXAM: CT VENOGRAM ABDOMEN AND PELVIS TECHNIQUE: CT of the abdomen and pelvis obtained following administration of intravenous contrast. Vascular timing optimized for venous phase imaging. CONTRAST:  146m OMNIPAQUE IOHEXOL 350 MG/ML SOLN COMPARISON:  CT abdomen/pelvis 07/05/2019 FINDINGS: VASCULAR IVC: Widely patent IVC. No evidence of IVC duplication or other congenital anomaly. Hepatic/portal veins: Widely patent. Renal veins: Widely patent.  Normal anatomy. Visceral veins: IMV and SMV are widely patent and unremarkable. Iliac veins: Bilateral common, internal and external iliac veins are widely patent. No evidence of iliac vein compression physiology. Femoral veins: Marked expansion of the left common femoral vein with central filling defect consistent with DVT. DVT extends into the profunda and femoral vein as well as just into the saphenofemoral junction. There is associated inflammatory stranding within the perivenous fat. The right femoral veins are patent and unremarkable. NON VASCULAR Lower chest: Small left pleural effusion with associated left lower lobe atelectasis. No evidence of pulmonary infarction. Limited visualization of the left lower lobe pulmonary arteries demonstrates some residual linear filling defects consistent with evolving pulmonary  emboli. The heart is normal in size. No evidence of right heart strain. Hepatobiliary: Geographic hypoattenuation in the left hemi-liver adjacent to the fissure for the falciform ligament is nonspecific but most suggestive of benign focal fatty infiltration. Normal hepatic contour and morphology. No discrete hepatic lesions. Normal appearance of the gallbladder. No intra or extrahepatic biliary ductal dilatation. Pancreas: No mass or inflammatory changes Spleen: Normal size and morphology. Genitourinary: Normal adrenal glands. No evidence of hydronephrosis, nephrolithiasis or enhancing renal mass. The ureters and kidneys are also unremarkable. The bladder is normal. Bowel: No evidence of obstruction or focal bowel wall thickening. The terminal ileum is unremarkable. Reproductive: Normal uterus and adnexa.  No adnexal mass. Other: No significant ascites or abdominal wall hernia. Edema present in the left upper thigh and inguinal region. Musculoskeletal: No acute fracture or aggressive appearing lytic or blastic osseous lesion. IMPRESSION: 1. No evidence of iliac vein compression physiology or ileo caval DVT. 2. Positive for occlusive DVT in the left common  femoral vein, femoral vein, profunda femoral vein and saphenofemoral junction with associated left upper thigh edema. 3. Small left pleural effusion with associated left lower lobe atelectasis. 4. Expected evolution of pulmonary emboli within the visualized pulmonary arteries. No evidence of right heart strain. Signed, Criselda Peaches, MD, [place] [facility] Radiology Electronically Signed   By: Jacqulynn Cadet M.D.   On: 01/15/2021 16:31   VAS [place] LOWER EXTREMITY VENOUS (DVT) ([facility] and [facility] 7a-7p)  Result Date: 01/08/2021  Lower Venous DVT Study Patient Name:  TANNYA GONET  Date of Exam:   01/08/2021 Medical Rec #: [id number]          Accession #:    [id number] Date of Birth: Dec 21, 1965         Patient Gender: F  Patient Age:   54Y Exam Location:  [facility] Procedure:      VAS [place] LOWER EXTREMITY VENOUS (DVT) Referring Phys: [id number] MICHAEL C BUTLER --------------------------------------------------------------------------------  Indications: Swelling.  Risk Factors: None identified. Comparison Study: No prior studies. Performing Technologist: Oliver Hum RVT  Examination Guidelines: A complete evaluation includes B-mode imaging, spectral Doppler, color Doppler, and power Doppler as needed of all accessible portions of each vessel. Bilateral testing is considered an integral part of a complete examination. Limited examinations for reoccurring indications may be performed as noted. The reflux portion of the exam is performed with the patient in reverse Trendelenburg.  +-----+---------------+---------+-----------+----------+--------------+ RIGHTCompressibilityPhasicitySpontaneityPropertiesThrombus Aging +-----+---------------+---------+-----------+----------+--------------+ CFV  Full           Yes      Yes                                 +-----+---------------+---------+-----------+----------+--------------+   +---------+---------------+---------+-----------+----------+--------------+ LEFT     CompressibilityPhasicitySpontaneityPropertiesThrombus Aging +---------+---------------+---------+-----------+----------+--------------+ CFV      Partial        No       No                   Acute          +---------+---------------+---------+-----------+----------+--------------+ SFJ      Full                                                        +---------+---------------+---------+-----------+----------+--------------+ FV Prox  None                                         Acute          +---------+---------------+---------+-----------+----------+--------------+ FV Mid   None           No       No                   Acute           +---------+---------------+---------+-----------+----------+--------------+ FV DistalNone           No       No                   Acute          +---------+---------------+---------+-----------+----------+--------------+ PFV      Full                                                        +---------+---------------+---------+-----------+----------+--------------+  POP      None           No       No                   Acute          +---------+---------------+---------+-----------+----------+--------------+ PTV      None                                         Acute          +---------+---------------+---------+-----------+----------+--------------+ PERO     None                                         Acute          +---------+---------------+---------+-----------+----------+--------------+ Soleal   None                                         Acute          +---------+---------------+---------+-----------+----------+--------------+ Gastroc  Partial                                      Acute          +---------+---------------+---------+-----------+----------+--------------+ EIV      Full           No       No                                  +---------+---------------+---------+-----------+----------+--------------+     Summary: RIGHT: - There is no evidence of deep vein thrombosis in the lower extremity.  LEFT: - Findings consistent with acute deep vein thrombosis involving the left common femoral vein, left femoral vein, left popliteal vein, left posterior tibial veins, left peroneal veins, left soleal veins, and left gastrocnemius veins. - No cystic structure found in the popliteal fossa.  *See table(s) above for measurements and observations. Electronically signed by Ruta Hinds MD on 01/08/2021 at 4:28:42 PM.    Final      Subjective: She is alert, chest pain persist but controlled. Leg pain improved.   Discharge Exam: Vitals:   01/17/21 2022  01/18/21 0512  BP: (!) 94/54 94/66  Pulse: (!) 59 (!) 56  Resp: 18 18  Temp: 98.1 F (36.7 C) 98.1 F (36.7 C)  SpO2: 96% 95%     General: Pt is alert, awake, not in acute distress Cardiovascular: RRR, S1/S2 +, no rubs, no gallops Respiratory: CTA bilaterally, no wheezing, no rhonchi Abdominal: Soft, NT, ND, bowel sounds + Extremities: no edema, no cyanosis    The results of significant diagnostics from this hospitalization (including imaging, microbiology, ancillary and laboratory) are listed below for reference.     Microbiology: Recent Results (from the past 240 hour(s))  Resp Panel by RT-PCR (Flu A&B, Covid) Nasopharyngeal Swab     Status: None   Collection Time: 01/08/21 12:20 PM   Specimen: Nasopharyngeal Swab; Nasopharyngeal(NP) swabs in vial transport medium  Result Value Ref Range Status   SARS Coronavirus 2 by RT  PCR NEGATIVE NEGATIVE Final    Comment: (NOTE) SARS-CoV-2 target nucleic acids are NOT DETECTED.  The SARS-CoV-2 RNA is generally detectable in upper respiratory specimens during the acute phase of infection. The lowest concentration of SARS-CoV-2 viral copies this assay can detect is 138 copies/mL. A negative result does not preclude SARS-Cov-2 infection and should not be used as the sole basis for treatment or other patient management decisions. A negative result may occur with  improper specimen collection/handling, submission of specimen other than nasopharyngeal swab, presence of viral mutation(s) within the areas targeted by this assay, and inadequate number of viral copies(<138 copies/mL). A negative result must be combined with clinical observations, patient history, and epidemiological information. The expected result is Negative.  Fact Sheet for Patients:  EntrepreneurPulse.com.au  Fact Sheet for Healthcare Providers:  IncredibleEmployment.be  This test is no t yet approved or cleared by the [place] FDA and  has been authorized for detection and/or diagnosis of SARS-CoV-2 by FDA under an Emergency Use Authorization (EUA). This EUA will remain  in effect (meaning this test can be used) for the duration of the COVID-19 declaration under Section 564(b)(1) of the Act, 21 U.S.C.section 360bbb-3(b)(1), unless the authorization is terminated  or revoked sooner.       Influenza A by PCR NEGATIVE NEGATIVE Final   Influenza B by PCR NEGATIVE NEGATIVE Final    Comment: (NOTE) The Xpert Xpress SARS-CoV-2/FLU/RSV plus assay is intended as an aid in the diagnosis of influenza from Nasopharyngeal swab specimens and should not be used as a sole basis for treatment. Nasal washings and aspirates are unacceptable for Xpert Xpress SARS-CoV-2/FLU/RSV testing.  Fact Sheet for Patients: EntrepreneurPulse.com.au  Fact Sheet for Healthcare Providers: IncredibleEmployment.be  This test is not yet approved or cleared by the [place] FDA and has been authorized for detection and/or diagnosis of SARS-CoV-2 by FDA under an Emergency Use Authorization (EUA). This EUA will remain in effect (meaning this test can be used) for the duration of the COVID-19 declaration under Section 564(b)(1) of the Act, 21 U.S.C. section 360bbb-3(b)(1), unless the authorization is terminated or revoked.  Performed at [facility], [place] [address] [place] [address] [place], [place] [address]   Culture, blood (routine x 2)     Status: None   Collection Time: 01/11/21 11:22 AM   Specimen: BLOOD  Result Value Ref Range Status   Specimen Description   Final    BLOOD RIGHT ANTECUBITAL Performed at [facility], [place] [address], [place], [place] [address]    Special Requests   Final    BOTTLES DRAWN AEROBIC AND ANAEROBIC Blood Culture adequate volume Performed at [facility] [address] [place], [place] [address]    Culture   Final    NO GROWTH  5 DAYS Performed at [facility], [place] [address], [place], [place] [address]    Report Status 01/16/2021 FINAL  Final  Culture, blood (routine x 2)     Status: None   Collection Time: 01/11/21 11:22 AM   Specimen: BLOOD  Result Value Ref Range Status   Specimen Description   Final    BLOOD LEFT ANTECUBITAL Performed at [facility], [place] [address] [place], [place] [address]    Special Requests   Final    BOTTLES DRAWN AEROBIC AND ANAEROBIC Blood Culture adequate volume Performed at [facility] [address] [place],  [address]    Culture   Final  NO GROWTH 5 DAYS Performed at [facility], [place] [address], [place], [place] [address]    Report Status 01/16/2021 FINAL  Final  Culture, Urine     Status: Abnormal   Collection Time: 01/11/21 11:40 AM   Specimen: Urine, Clean Catch  Result Value Ref Range Status   Specimen Description   Final    URINE, CLEAN CATCH Performed at [facility], [place] [address] [place], [place] [address]    Special Requests   Final    NONE Performed at [facility], [place] [address], [place], [place] [address]    Culture >=100,000 COLONIES/mL ESCHERICHIA COLI (A)  Final   Report Status 01/14/2021 FINAL  Final   Organism ID, Bacteria ESCHERICHIA COLI (A)  Final      Susceptibility   Escherichia coli - MIC*    AMPICILLIN 8 SENSITIVE Sensitive     CEFAZOLIN <=4 SENSITIVE Sensitive     CEFEPIME <=0.12 SENSITIVE Sensitive     CEFTRIAXONE <=0.25 SENSITIVE Sensitive     CIPROFLOXACIN <=0.25 SENSITIVE Sensitive     GENTAMICIN <=1 SENSITIVE Sensitive     IMIPENEM <=0.25 SENSITIVE Sensitive     NITROFURANTOIN <=16 SENSITIVE Sensitive     TRIMETH/SULFA <=20 SENSITIVE Sensitive     AMPICILLIN/SULBACTAM 4 SENSITIVE Sensitive     PIP/TAZO <=4 SENSITIVE Sensitive     * >=100,000 COLONIES/mL ESCHERICHIA COLI     Labs: BNP (last 3 results) Recent Labs    01/08/21 1220   BNP 80.1   Basic Metabolic Panel: Recent Labs  Lab 01/12/21 0533 01/13/21 0543 01/14/21 0458 01/15/21 0446 01/16/21 0505 01/17/21 0816  NA 133* 135  --  140 138 137  K 4.1 3.4* 3.5 3.5 3.4* 4.3  CL 102 105  --  107 107 103  CO2 23 23  --  _0 GLUCOSE 83 83  --  74 83 94  BUN 8 6  --  <5* <5* 5*  CREATININE 0.39* 0.42*  --  0.49 0.47 0.57  CALCIUM 8.1* 7.8*  --  8.4* 8.2* 8.5*  MG 1.9  --   --  1.9  --   --   PHOS 2.4*  --   --  3.5  --   --    Liver Function Tests: No results for input(s): AST, ALT, ALKPHOS, BILITOT, PROT, ALBUMIN in the last 168 hours. No results for input(s): LIPASE, AMYLASE in the last 168 hours. No results for input(s): AMMONIA in the last 168 hours. CBC: Recent Labs  Lab 01/12/21 0533 01/13/21 0543 01/15/21 0446 01/16/21 0505 01/17/21 0816  WBC 8.4 6.1 5.4 4.7 6.4  NEUTROABS  --   --   --  2.2  --   HGB 12.5 11.7* 12.0 12.5 12.2  HCT 38.7 36.3 38.2 39.5 38.4  MCV 93.0 94.3 95.0 94.5 93.7  PLT 219 257 390 436* 451*   Cardiac Enzymes: No results for input(s): CKTOTAL, CKMB, CKMBINDEX, TROPONINI in the last 168 hours. BNP: Invalid input(s): POCBNP CBG: No results for input(s): GLUCAP in the last 168 hours. D-Dimer No results for input(s): DDIMER in the last 72 hours. Hgb A1c No results for input(s): HGBA1C in the last 72 hours. Lipid Profile No results for input(s): CHOL, HDL, LDLCALC, TRIG, CHOLHDL, LDLDIRECT in the last 72 hours. Thyroid function studies No results for input(s): TSH, T4TOTAL, T3FREE, THYROIDAB in the last 72 hours.  Invalid input(s): FREET3 Anemia work up No results for input(s): VITAMINB12, FOLATE,  FERRITIN, TIBC, IRON, RETICCTPCT in the last 72 hours. Urinalysis    Component Value Date/Time   COLORURINE AMBER (A) 01/11/2021 1139   APPEARANCEUR HAZY (A) 01/11/2021 1139   LABSPEC 1.023 01/11/2021 1139   PHURINE 5.0 01/11/2021 1139   GLUCOSEU NEGATIVE 01/11/2021 1139   HGBUR MODERATE (A) 01/11/2021 1139    BILIRUBINUR NEGATIVE 01/11/2021 1139   KETONESUR 20 (A) 01/11/2021 1139   PROTEINUR NEGATIVE 01/11/2021 1139   UROBILINOGEN 0.2 08/29/2007 1601   NITRITE POSITIVE (A) 01/11/2021 1139   LEUKOCYTESUR NEGATIVE 01/11/2021 1139   Sepsis Labs Invalid input(s): PROCALCITONIN,  WBC,  LACTICIDVEN Microbiology Recent Results (from the past 240 hour(s))  Resp Panel by RT-PCR (Flu A&B, Covid) Nasopharyngeal Swab     Status: None   Collection Time: 01/08/21 12:20 PM   Specimen: Nasopharyngeal Swab; Nasopharyngeal(NP) swabs in vial transport medium  Result Value Ref Range Status   SARS Coronavirus 2 by RT PCR NEGATIVE NEGATIVE Final    Comment: (NOTE) SARS-CoV-2 target nucleic acids are NOT DETECTED.  The SARS-CoV-2 RNA is generally detectable in upper respiratory specimens during the acute phase of infection. The lowest concentration of SARS-CoV-2 viral copies this assay can detect is 138 copies/mL. A negative result does not preclude SARS-Cov-2 infection and should not be used as the sole basis for treatment or other patient management decisions. A negative result may occur with  improper specimen collection/handling, submission of specimen other than nasopharyngeal swab, presence of viral mutation(s) within the areas targeted by this assay, and inadequate number of viral copies(<138 copies/mL). A negative result must be combined with clinical observations, patient history, and epidemiological information. The expected result is Negative.  Fact Sheet for Patients:  EntrepreneurPulse.com.au  Fact Sheet for Healthcare Providers:  IncredibleEmployment.be  This test is no t yet approved or cleared by the [place] FDA and  has been authorized for detection and/or diagnosis of SARS-CoV-2 by FDA under an Emergency Use Authorization (EUA). This EUA will remain  in effect (meaning this test can be used) for the duration of the COVID-19 declaration under  Section 564(b)(1) of the Act, 21 U.S.C.section 360bbb-3(b)(1), unless the authorization is terminated  or revoked sooner.       Influenza A by PCR NEGATIVE NEGATIVE Final   Influenza B by PCR NEGATIVE NEGATIVE Final    Comment: (NOTE) The Xpert Xpress SARS-CoV-2/FLU/RSV plus assay is intended as an aid in the diagnosis of influenza from Nasopharyngeal swab specimens and should not be used as a sole basis for treatment. Nasal washings and aspirates are unacceptable for Xpert Xpress SARS-CoV-2/FLU/RSV testing.  Fact Sheet for Patients: EntrepreneurPulse.com.au  Fact Sheet for Healthcare Providers: IncredibleEmployment.be  This test is not yet approved or cleared by the [place] FDA and has been authorized for detection and/or diagnosis of SARS-CoV-2 by FDA under an Emergency Use Authorization (EUA). This EUA will remain in effect (meaning this test can be used) for the duration of the COVID-19 declaration under Section 564(b)(1) of the Act, 21 U.S.C. section 360bbb-3(b)(1), unless the authorization is terminated or revoked.  Performed at [facility], [place] [address] [place], [place] [address]   Culture, blood (routine x 2)     Status: None   Collection Time: 01/11/21 11:22 AM   Specimen: BLOOD  Result Value Ref Range Status   Specimen Description   Final    BLOOD RIGHT ANTECUBITAL Performed at [facility], [place] [address] [place], [place] [address]    Special Requests  Final    BOTTLES DRAWN AEROBIC AND ANAEROBIC Blood Culture adequate volume Performed at [facility] [address], [place], [place] [address]    Culture   Final    NO GROWTH 5 DAYS Performed at [facility], [place] [address] [place], [place] [address]    Report Status 01/16/2021 FINAL  Final  Culture, blood (routine x 2)     Status: None   Collection Time: 01/11/21 11:22 AM   Specimen: BLOOD  Result Value Ref  Range Status   Specimen Description   Final    BLOOD LEFT ANTECUBITAL Performed at [facility], [place] [address] [place], [place] [address]    Special Requests   Final    BOTTLES DRAWN AEROBIC AND ANAEROBIC Blood Culture adequate volume Performed at [facility] [address] [place], [place] [address]    Culture   Final    NO GROWTH 5 DAYS Performed at [facility], [place] [address], [place], [place] [address]    Report Status 01/16/2021 FINAL  Final  Culture, Urine     Status: Abnormal   Collection Time: 01/11/21 11:40 AM   Specimen: Urine, Clean Catch  Result Value Ref Range Status   Specimen Description   Final    URINE, CLEAN CATCH Performed at [facility], [place] [address], [place], [place] [address]    Special Requests   Final    NONE Performed at [facility], [place] [address], [place], [place] [address]    Culture >=100,000 COLONIES/mL ESCHERICHIA COLI (A)  Final   Report Status 01/14/2021 FINAL  Final   Organism ID, Bacteria ESCHERICHIA COLI (A)  Final      Susceptibility   Escherichia coli - MIC*    AMPICILLIN 8 SENSITIVE Sensitive     CEFAZOLIN <=4 SENSITIVE Sensitive     CEFEPIME <=0.12 SENSITIVE Sensitive     CEFTRIAXONE <=0.25 SENSITIVE Sensitive     CIPROFLOXACIN <=0.25 SENSITIVE Sensitive     GENTAMICIN <=1 SENSITIVE Sensitive     IMIPENEM <=0.25 SENSITIVE Sensitive     NITROFURANTOIN <=16 SENSITIVE Sensitive     TRIMETH/SULFA <=20 SENSITIVE Sensitive     AMPICILLIN/SULBACTAM 4 SENSITIVE Sensitive     PIP/TAZO <=4 SENSITIVE Sensitive     * >=100,000 COLONIES/mL ESCHERICHIA COLI     Time coordinating discharge: 40 minutes  SIGNED:   Elmarie Shiley, MD  [facility]

## 2021-01-18 NOTE — TOC Transition Note (Signed)
Transition of Care [facility]) - CM/SW Discharge Note   Patient Details  Name: Kristina Owens MRN: [id number] Date of Birth: 1965-12-19  [facility] CM/SW Contact:  Lanier Clam, RN Phone Number: 01/18/2021, 10:16 AM   Clinical Narrative: MATCH program to be used for meds-patient has $3 co pay for each med-Pharmacy [facility] [address] [place].Paitent will go to IP rehab for sobriety. No further CM needs.      Final next level of care: IP Rehab Facility Barriers to Discharge: No Barriers Identified   Patient Goals and CMS Choice Patient states their goals for this hospitalization and ongoing recovery are:: may go to her car or friends home CMS Medicare.gov Compare Post Acute Care list provided to:: Patient    Discharge Placement                       Discharge Plan and Services   Discharge Planning Services: CM Consult                                 Social Determinants of Health (SDOH) Interventions     Readmission Risk Interventions No flowsheet data found.

## 2021-01-18 NOTE — Progress Notes (Signed)
Physical Therapy Treatment Patient Details Name: Kristina Owens MRN: [id number] DOB: September 04, 1965 Today's Date: 01/18/2021    History of Present Illness 55 year old female who presents with L leg pain for 2 weeks; also SOB without chest pain, chronic cough. Bilateral LE Doppler - Positive LLE acute DVT involving the left common femoral, left femoral, popliteal, posterior tibial, peroneal, soleal and gastrocnemius veins. CTA chest - multiple bilateral PE including saddle embolus with CT evidence of right heart strain. PMH: anxiety, depression, GERD, bipolar, polysubstance abuse, alcohol abuse    PT Comments    Pt progressing well with her mobility.  Amb in hallway.  General Gait Details: good alternating gait with no need for any AD.  Mild c/o L calf pain (L LE DVT) "tender/tight". Practiced stairs.  General stair comments: 50% VC's on proper sequencing and safety "up with the good (right)/down with bad (left)" Pt plans to go to a Drug Rehab Facility.    Follow Up Recommendations  No PT follow up     Equipment Recommendations  None recommended by PT    Recommendations for Other Services       Precautions / Restrictions Precautions Precautions: Fall    Mobility  Bed Mobility Overal bed mobility: Modified Independent             General bed mobility comments: slightly increased time    Transfers Overall transfer level: Modified independent Equipment used: None Transfers: Sit to/from [employer] Sit to Stand: Modified independent (Device/Increase time) Stand pivot transfers: Modified independent (Device/Increase time)       General transfer comment: self able with good safety cognition and use of hands to steady self  Ambulation/Gait Ambulation/Gait assistance: Supervision Gait Distance (Feet): 250 Feet Assistive device: None Gait Pattern/deviations: Step-to pattern;Decreased stride length;Decreased weight shift to left Gait velocity: decreased    General Gait Details: good alternating gait with no need for any AD.  Mild c/o L calf pain (L LE DVT) "tender/tight".   Stairs Stairs: Yes Stairs assistance: Supervision Stair Management: One rail Right;Step to pattern;Forwards Number of Stairs: 5 General stair comments: 50% VC's on proper sequencing and safety "up with the good (right)/down with bad (left)"   Wheelchair Mobility    Modified Rankin (Stroke Patients Only)       Balance                                            Cognition Arousal/Alertness: Awake/alert Behavior During Therapy: WFL for tasks assessed/performed Overall Cognitive Status: Within Functional Limits for tasks assessed                                 General Comments: AxO x 3 very pleasant lady      Exercises      General Comments        Pertinent Vitals/Pain Pain Assessment: Faces Pain Location: L calf Pain Descriptors / Indicators: Aching;Discomfort Pain Intervention(s): Monitored during session    Home Living                      Prior Function            PT Goals (current goals can now be found in the care plan section) Progress towards PT goals: Progressing toward goals    Frequency  Min 3X/week      PT Plan Current plan remains appropriate    Co-evaluation              AM-PAC PT "6 Clicks" Mobility   Outcome Measure  Help needed turning from your back to your side while in a flat bed without using bedrails?: None Help needed moving from lying on your back to sitting on the side of a flat bed without using bedrails?: None Help needed moving to and from a bed to a chair (including a wheelchair)?: A Little Help needed standing up from a chair using your arms (e.g., wheelchair or bedside chair)?: A Little Help needed to walk in hospital room?: A Little Help needed climbing 3-5 steps with a railing? : A Little 6 Click Score: 20    End of Session Equipment Utilized  During Treatment: Gait belt Activity Tolerance: Patient tolerated treatment well Patient left: in bed;with call bell/phone within reach;with bed alarm set Nurse Communication: Mobility status;Other (comment) PT Visit Diagnosis: Other abnormalities of gait and mobility (R26.89)     Time: 5697-9480 PT Time Calculation (min) (ACUTE ONLY): 10 min  Charges:  $Gait Training: 8-22 mins                     Felecia Shelling  PTA Acute  Rehabilitation Services Pager      [phone] Office      [phone]

## 2021-01-21 ENCOUNTER — Other Ambulatory Visit (HOSPITAL_COMMUNITY): Payer: Self-pay

## 2021-01-23 ENCOUNTER — Other Ambulatory Visit: Payer: Self-pay | Admitting: Radiology

## 2021-01-23 DIAGNOSIS — I82402 Acute embolism and thrombosis of unspecified deep veins of left lower extremity: Secondary | ICD-10-CM

## 2021-01-23 NOTE — Care Management (Signed)
Received call after d/c from [facility] secy [place] about MATCH letter-spoke to pharmacist Maurine Minister from [facility] [address] Informed of mrn# [phone] needed to add zero-which is added to prior number [facility] letter went through.No further assistance needed.

## 2021-01-28 ENCOUNTER — Other Ambulatory Visit: Payer: Self-pay

## 2021-01-28 ENCOUNTER — Emergency Department (HOSPITAL_COMMUNITY)
Admission: EM | Admit: 2021-01-28 | Discharge: 2021-01-28 | Disposition: A | Discharge status: Home / Self Care | Payer: Medicaid Other | Attending: Emergency Medicine | Admitting: Emergency Medicine

## 2021-01-28 ENCOUNTER — Encounter (HOSPITAL_COMMUNITY): Payer: Self-pay | Admitting: Emergency Medicine

## 2021-01-28 DIAGNOSIS — Z8616 Personal history of COVID-19: Secondary | ICD-10-CM | POA: Insufficient documentation

## 2021-01-28 DIAGNOSIS — R2242 Localized swelling, mass and lump, left lower limb: Secondary | ICD-10-CM

## 2021-01-28 DIAGNOSIS — F17210 Nicotine dependence, cigarettes, uncomplicated: Secondary | ICD-10-CM | POA: Insufficient documentation

## 2021-01-28 DIAGNOSIS — Z7901 Long term (current) use of anticoagulants: Secondary | ICD-10-CM | POA: Insufficient documentation

## 2021-01-28 DIAGNOSIS — R600 Localized edema: Secondary | ICD-10-CM | POA: Insufficient documentation

## 2021-01-28 DIAGNOSIS — M79662 Pain in left lower leg: Secondary | ICD-10-CM | POA: Insufficient documentation

## 2021-01-28 LAB — CBC WITH DIFFERENTIAL/PLATELET
Abs Immature Granulocytes: 0.05 10*3/uL (ref 0.00–0.07)
Basophils Absolute: 0 10*3/uL (ref 0.0–0.1)
Basophils Relative: 1 %
Eosinophils Absolute: 0.2 10*3/uL (ref 0.0–0.5)
Eosinophils Relative: 2 %
HCT: 42.3 % (ref 36.0–46.0)
Hemoglobin: 13.4 g/dL (ref 12.0–15.0)
Immature Granulocytes: 1 %
Lymphocytes Relative: 45 %
Lymphs Abs: 3.2 10*3/uL (ref 0.7–4.0)
MCH: 29.5 pg (ref 26.0–34.0)
MCHC: 31.7 g/dL (ref 30.0–36.0)
MCV: 93 fL (ref 80.0–100.0)
Monocytes Absolute: 0.6 10*3/uL (ref 0.1–1.0)
Monocytes Relative: 9 %
Neutro Abs: 2.9 10*3/uL (ref 1.7–7.7)
Neutrophils Relative %: 42 %
Platelets: 449 10*3/uL — ABNORMAL HIGH (ref 150–400)
RBC: 4.55 MIL/uL (ref 3.87–5.11)
RDW: 15.5 % (ref 11.5–15.5)
WBC: 7 10*3/uL (ref 4.0–10.5)
nRBC: 0 % (ref 0.0–0.2)

## 2021-01-28 LAB — BASIC METABOLIC PANEL
Anion gap: 10 (ref 5–15)
BUN: 12 mg/dL (ref 6–20)
CO2: 20 mmol/L — ABNORMAL LOW (ref 22–32)
Calcium: 9.2 mg/dL (ref 8.9–10.3)
Chloride: 110 mmol/L (ref 98–111)
Creatinine, Ser: 0.71 mg/dL (ref 0.44–1.00)
GFR, Estimated: >60 mL/min (ref >60)
Glucose, Bld: 80 mg/dL (ref 70–99)
Potassium: 4.2 mmol/L (ref 3.5–5.1)
Sodium: 140 mmol/L (ref 135–145)

## 2021-01-28 MED ORDER — HYDROCODONE-ACETAMINOPHEN 5-325 MG PO TABS: 1.0000 | ORAL_TABLET | ORAL | 0 refills | Status: DC | PRN

## 2021-01-28 NOTE — Discharge Instructions (Addendum)
It is possible that you have developed another blood clot in your left leg.  I have ordered for you to obtain an ultrasound tomorrow morning.  Please come to the emergency department at 11 AM and tell them that you have a ultrasound scheduled.  Please continue taking all medications as prescribed.

## 2021-01-28 NOTE — ED Provider Notes (Signed)
[facility] EMERGENCY DEPARTMENT Provider Note   CSN: [id number] Arrival date & time: 01/28/21  1754     History Chief Complaint  Patient presents with   Foot/Ankle Swelling    Kristina Owens is a 55 y.o. female.  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  Unclear cause of VTE.  Patient thinks it was related to recent COVID-19 infection.  She was started on Eliquis for further VTE prophylaxis.  2 weeks ago she had mechanical thrombectomy for DVT in the left lower extremity.  She had significant improvement in pain and symptoms after the procedure. Has not missed any doses of Eliquis. Presenting complaining of left lower extremity swelling and pain that is new and starting within the last 24 hours.  No injury to the left lower extremity.  No falls or trauma.  Denies any rashes.     Past Medical History:  Diagnosis Date   Anxiety    Depression    GERD (gastroesophageal reflux disease)    Ovarian cyst     Patient Active Problem List   Diagnosis Date Noted   Pulmonary embolism (HCC) 01/08/2021   DVT (deep venous thrombosis) (HCC) 01/08/2021   Polysubstance (excluding opioids) dependence (HCC) 01/08/2021   Alcohol abuse with withdrawal (HCC) 01/08/2021   Bipolar disorder, in full remission, most recent episode depressed (HCC) 01/18/2020   Generalized anxiety disorder 01/18/2020   Alcohol use disorder, severe, in early remission, dependence (HCC) 01/18/2020   Cocaine use disorder, severe, in early remission (HCC) 01/18/2020   Suicidal ideation    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     Family History  Problem Relation Age of Onset   Asthma Mother    COPD Mother    Cancer Father        thyroid cancer    Social History   Tobacco Use   Smoking status: Every Day    Packs/day: 0.50    Years: 35.00    Pack years: 17.50    Types: Cigarettes   Smokeless tobacco: Never  Vaping Use   Vaping Use: Never used  Substance Use Topics   Alcohol use: Yes   Drug use: Yes    Types: "Crack" cocaine, Cocaine, Methamphetamines    Comment: last used 4/31/22    Home Medications Prior to Admission medications   Medication Sig Start Date End Date Taking? Authorizing Provider  apixaban (ELIQUIS) 5 MG TABS tablet Take 1 tablet (5 mg total) by mouth 2 (two) times daily. 01/18/21   Regalado, Belkys A, MD  ARIPiprazole (ABILIFY) 10 MG tablet Take 1 tablet (10 mg total) by mouth daily. 01/19/21   Regalado, Belkys A, MD  busPIRone (BUSPAR) 10 MG tablet Take 1 tablet (10 mg total) by mouth 3 (three) times daily. 01/18/21   Regalado, Prentiss Bells, MD  folic acid (FOLVITE) 1 MG tablet Take 1 tablet (1 mg total) by mouth daily. 01/19/21   Regalado, Belkys A, MD  lamoTRIgine (LAMICTAL) 25 MG tablet Take 1 tablet (25 mg total) by mouth daily. 01/19/21   Regalado, Belkys A, MD  Multiple Vitamin (MULTIVITAMIN WITH MINERALS) TABS tablet Take 1 tablet by mouth daily. 01/19/21   Regalado, Belkys A, MD  nicotine (NICODERM CQ - DOSED IN MG/24 HOURS) 21 mg/24hr patch Place 1 patch (21 mg total) onto the skin daily. 01/19/21   Regalado, Belkys A, MD  polyethylene glycol (MIRALAX / GLYCOLAX) 17 g packet Take 17 g by mouth daily as needed for mild constipation. 01/18/21   Regalado, Belkys A, MD  sertraline (ZOLOFT) 50 MG tablet Take 1 tablet (50 mg total) by mouth  daily. 01/18/21   Regalado, Prentiss Bells, MD    Allergies    Patient has no known allergies.  Review of Systems   Review of Systems  Constitutional:  Negative for chills and fever.  HENT:  Negative for ear pain and sore throat.   Eyes:  Negative for pain and visual disturbance.  Respiratory:  Negative for cough and shortness of breath.   Cardiovascular:  Positive for leg swelling (LLE). Negative for chest pain and palpitations.  Gastrointestinal:  Negative for abdominal pain and vomiting.  Genitourinary:  Negative for dysuria and hematuria.  Musculoskeletal:  Negative for arthralgias and back pain.  Skin:  Negative for color change and rash.  Neurological:  Negative for seizures and syncope.  All other systems reviewed and are negative.  Physical Exam Updated Vital Signs BP 115/89 (BP Location: Right Arm)   Pulse 71   Temp 98 F (36.7 C)   Resp 18   Ht 5\' 4"  (1.626 m)   Wt 60 kg   SpO2 98%   BMI 22.71 kg/m   Physical Exam Vitals and nursing note reviewed.  Constitutional:      General: She is not in acute distress.    Appearance: She is well-developed.  HENT:     Head: Normocephalic and atraumatic.  Eyes:     Conjunctiva/sclera: Conjunctivae normal.  Cardiovascular:     Rate and Rhythm: Normal rate and regular rhythm.     Heart sounds: No murmur heard. Pulmonary:     Effort: Pulmonary effort is normal. No respiratory distress.     Breath sounds: Normal breath sounds.  Abdominal:     Palpations: Abdomen is soft.     Tenderness: There is no abdominal tenderness.  Musculoskeletal:     Cervical back: Neck supple.     Comments:  Mild appreciable edema in the left lower extremity compared to right. No erythema. Full active ROM at the ankle and knee. Posterior leg is tender to palpation. No wounds. There is a surgical incision with sutures in place from thrombectomy. No drainage, erythema or warmth.  Skin:    General: Skin is warm and dry.  Neurological:     Mental  Status: She is alert.    ED Results / Procedures / Treatments   Labs (all labs ordered are listed, but only abnormal results are displayed) Labs Reviewed  CBC WITH DIFFERENTIAL/PLATELET - Abnormal; Notable for the following components:      Result Value   Platelets 449 (*)    All other components within normal limits  BASIC METABOLIC PANEL - Abnormal; Notable for the following components:   CO2 20 (*)    All other components within normal limits  EKG None  Radiology No results found.  Procedures Procedures   Medications Ordered in ED Medications - No data to display  ED Course  I have reviewed the triage vital signs and the nursing notes.  Pertinent labs & imaging results that were available during my care of the patient were reviewed by me and considered in my medical decision making (see chart for details).    MDM Rules/Calculators/A&P                          Primary concern is recurrent/worsening deep vein thrombosis of the left lower extremity despite ongoing systemic anticoagulation with Eliquis. Unfortunately, at the time of her emergency department visit I am unable to have a vascular lower extremity ultrasound done(lack of [place] tech).  I feel as though the study is indicated though and I placed an order for her to have this completed tomorrow morning at 11 AM.  She will return to the emergency department to have the study completed. I do not suspect septic arthritis.  I do not suspect traumatic musculoskeletal injury.  No indication for plain films at this time. Exam not c/w cellulitis or post-op wound infection. Known pulmonary emboli however no respiratory symptoms such as shortness of breath, chest pain.  Patient will return tomorrow for DVT study.  Final Clinical Impression(s) / ED Diagnoses Final diagnoses:  Localized swelling of left lower leg    Rx / DC Orders ED Discharge Orders          Ordered    LE VENOUS        01/28/21 2226              Ardeen Fillers, DO 01/28/21 2235    Gerhard Munch, MD 01/28/21 2336

## 2021-01-28 NOTE — ED Provider Notes (Signed)
Emergency Medicine Provider Triage Evaluation Note  [facility] , a 55 y.o. female  was evaluated in triage.  Pt complains of lle swelling and pain that started today. She recently had a thrombectomy of the lle.  Review of Systems  Positive: Lle swelling/pain Negative: fever  Physical Exam  There were no vitals taken for this visit. Gen:   Awake, no distress   Resp:  Normal effort  MSK:   Moves extremities without difficulty  Other:  Ttp to the left calf, dp pulse intact  Medical Decision Making  Medically screening exam initiated at 7:15 PM.  Appropriate orders placed.  Agripina [place] was informed that the remainder of the evaluation will be completed by another provider, this initial triage assessment does not replace that evaluation, and the importance of remaining in the ED until their evaluation is complete.     Rayne Du 01/28/21 1918    Terrilee Files, MD 01/29/21 1018

## 2021-01-28 NOTE — ED Triage Notes (Signed)
Patient reports pain and swelling ar left foot and ankle radiating to left calf today , denies injury/ambulatory .

## 2021-01-29 ENCOUNTER — Encounter (HOSPITAL_COMMUNITY): Payer: Self-pay | Admitting: Emergency Medicine

## 2021-01-29 ENCOUNTER — Ambulatory Visit (HOSPITAL_COMMUNITY)
Admission: RE | Admit: 2021-01-29 | Discharge: 2021-01-29 | Disposition: A | Discharge status: Home / Self Care | Payer: Self-pay | Source: Ambulatory Visit | Attending: Emergency Medicine | Admitting: Emergency Medicine

## 2021-01-29 ENCOUNTER — Emergency Department (HOSPITAL_COMMUNITY)
Admission: EM | Admit: 2021-01-29 | Discharge: 2021-01-29 | Disposition: A | Discharge status: Home / Self Care | Payer: Medicaid Other | Attending: Emergency Medicine | Admitting: Emergency Medicine

## 2021-01-29 DIAGNOSIS — Z7901 Long term (current) use of anticoagulants: Secondary | ICD-10-CM | POA: Insufficient documentation

## 2021-01-29 DIAGNOSIS — F17210 Nicotine dependence, cigarettes, uncomplicated: Secondary | ICD-10-CM | POA: Insufficient documentation

## 2021-01-29 DIAGNOSIS — M79662 Pain in left lower leg: Secondary | ICD-10-CM | POA: Insufficient documentation

## 2021-01-29 DIAGNOSIS — M79605 Pain in left leg: Secondary | ICD-10-CM

## 2021-01-29 DIAGNOSIS — I824Y2 Acute embolism and thrombosis of unspecified deep veins of left proximal lower extremity: Secondary | ICD-10-CM

## 2021-01-29 DIAGNOSIS — Z01810 Encounter for preprocedural cardiovascular examination: Secondary | ICD-10-CM

## 2021-01-29 MED ORDER — HYDROCODONE-ACETAMINOPHEN 5-325 MG PO TABS
1.0000 | ORAL_TABLET | Freq: Once | ORAL | Status: AC
  Administered 2021-01-29: 1 via ORAL
  Filled 2021-01-29: qty 1

## 2021-01-29 NOTE — ED Provider Notes (Signed)
[facility] EMERGENCY DEPARTMENT Provider Note   CSN: [id number] Arrival date & time: 01/29/21  1134     History Chief Complaint  Patient presents with   DVT    Kristina Owens is a 55 y.o. female.  HPI  Patient presented to the ED for evaluation of persistent left leg pain.  Patient has history of a DVT and pulmonary embolism.  On June 7 she was admitted to the hospital with a saddle pulmonary embolism.  She was admitted on June 7 and discharged on June 17.  During her hospitalization she had a thrombectomy performed by Dr. Archer Asa.  Patient has been taking Eliquis..  Patient noticed in the last couple of days she started having increasing left lower extremity swelling and pain.  She was seen in the emergency room last evening.  She had an outpatient Doppler study performed today.  The findings show evidence of acute DVT involving the left popliteal left great saphenous and left proximal profunda vein.  Patient was instructed come back to the ED  Past Medical History:  Diagnosis Date   Anxiety    Depression    GERD (gastroesophageal reflux disease)    Ovarian cyst     Patient Active Problem List   Diagnosis Date Noted   Pulmonary embolism (HCC) 01/08/2021   DVT (deep venous thrombosis) (HCC) 01/08/2021   Polysubstance (excluding opioids) dependence (HCC) 01/08/2021   Alcohol abuse with withdrawal (HCC) 01/08/2021   Bipolar disorder, in full remission, most recent episode depressed (HCC) 01/18/2020   Generalized anxiety disorder 01/18/2020   Alcohol use disorder, severe, in early remission, dependence (HCC) 01/18/2020   Cocaine use disorder, severe, in early remission (HCC) 01/18/2020   Suicidal ideation    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     Family History  Problem Relation Age of Onset   Asthma Mother    COPD Mother    Cancer Father        thyroid cancer    Social History   Tobacco Use   Smoking status: Every Day    Packs/day: 0.50    Years: 35.00    Pack years: 17.50    Types: Cigarettes   Smokeless tobacco: Never  Vaping Use   Vaping Use: Never used  Substance Use Topics   Alcohol use: Yes   Drug use: Yes    Types: "Crack" cocaine, Cocaine, Methamphetamines    Comment: last used 4/31/22    Home Medications Prior to Admission medications   Medication Sig Start Date End Date Taking? Authorizing Provider  acetaminophen (TYLENOL) 500 MG tablet Take 1,000 mg by mouth every 4 (four) hours as needed (for pain- ALTERNATING WITH IBUPROFEN).   Yes [provider]  apixaban (ELIQUIS) 5 MG TABS tablet Take 1 tablet (5 mg total) by mouth 2 (two) times daily. 01/18/21  Yes Regalado, Belkys A, MD  ARIPiprazole (ABILIFY) 10 MG  tablet Take 1 tablet (10 mg total) by mouth daily. 01/19/21  Yes Regalado, Belkys A, MD  busPIRone (BUSPAR) 10 MG tablet Take 1 tablet (10 mg total) by mouth 3 (three) times daily. Patient taking differently: Take 10 mg by mouth 3 (three) times daily as needed (for anxiety). 01/18/21  Yes Regalado, Belkys A, MD  ibuprofen (ADVIL) 200 MG tablet Take 800 mg by mouth every 4 (four) hours as needed (for pain- ALTERNATING WITH ACETAMINOPHEN).   Yes [provider]  lamoTRIgine (LAMICTAL) 25 MG tablet Take 1 tablet (25 mg total) by mouth daily. 01/19/21  Yes Regalado, Belkys A, MD  sertraline (ZOLOFT) 50 MG tablet Take 1 tablet (50 mg total) by mouth daily. 01/18/21  Yes Regalado,  Belkys A, MD  folic acid (FOLVITE) 1 MG tablet Take 1 tablet (1 mg total) by mouth daily. Patient not taking: Reported on 01/29/2021 01/19/21   Regalado,  Billings A, MD  HYDROcodone-acetaminophen (NORCO/VICODIN) 5-325 MG tablet Take 1 tablet by mouth every 4 (four) hours as needed. Patient not taking: Reported on 01/29/2021 01/28/21   Ardeen Fillers, DO  Multiple Vitamin (MULTIVITAMIN WITH MINERALS) TABS tablet Take 1 tablet by mouth daily. Patient not taking: Reported on 01/29/2021 01/19/21   Regalado,  Billings A, MD  nicotine (NICODERM CQ - DOSED IN MG/24 HOURS) 21 mg/24hr patch Place 1 patch (21 mg total) onto the skin daily. Patient not taking: No sig reported 01/19/21   Regalado, Belkys A, MD  polyethylene glycol (MIRALAX / GLYCOLAX) 17 g packet Take 17 g by mouth daily as needed for mild constipation. Patient not taking: Reported on 01/29/2021 01/18/21   Alba Cory, MD    Allergies    Patient has no known allergies.  Review of Systems   Review of Systems  All other systems reviewed and are negative.  Physical Exam Updated Vital Signs BP 111/73   Pulse 69   Temp 98.4 F (36.9 C)   Resp 13   SpO2 96%   Physical Exam Vitals and nursing note reviewed.  Constitutional:      General: She is not in acute distress.    Appearance: She is well-developed.  HENT:     Head: Normocephalic and atraumatic.     Right Ear: External ear normal.     Left Ear: External ear normal.  Eyes:     General: No scleral icterus.       Right eye: No discharge.        Left eye: No discharge.     Conjunctiva/sclera: Conjunctivae normal.  Neck:     Trachea: No tracheal deviation.  Cardiovascular:     Rate and Rhythm: Normal rate and regular rhythm.  Pulmonary:     Effort: Pulmonary effort is normal. No respiratory distress.     Breath sounds: Normal breath sounds. No stridor. No wheezing or rales.  Abdominal:     General: Bowel sounds are normal. There is no distension.     Palpations: Abdomen  is soft.     Tenderness: There is no abdominal tenderness. There is no guarding or rebound.  Musculoskeletal:        General: Tenderness present. No deformity.     Cervical back: Neck supple.  Skin:    General: Skin is warm and dry.     Findings: No rash.  Neurological:     General: No focal deficit present.     Mental Status: She is alert.     Cranial Nerves: No cranial nerve deficit (no  facial droop, extraocular movements intact, no slurred speech).     Sensory: No sensory deficit.     Motor: No abnormal muscle tone or seizure activity.     Coordination: Coordination normal.  Psychiatric:        Mood and Affect: Mood normal.    ED Results / Procedures / Treatments   Labs (all labs ordered are listed, but only abnormal results are displayed) Labs Reviewed - No data to display  EKG None  Radiology LE VENOUS  Result Date: 01/29/2021  Lower Venous DVT Study Patient Name:  Kristina Owens  Date of Exam:   01/29/2021 Medical Rec #: [id number]          Accession #:    [id number] Date of Birth: 07-15-1966         Patient Gender: F Patient Age:   [age]Y Exam [facility]  [facility] Procedure:      VAS US LOWER EXTREMITY VENOUS (DVT) Referring Phys: [id number] ROBERT LOCKWOOD --------------------------------------------------------------------------------  Indications: Pain LLE post thrombectomy. Other Indications: Chest pain. Risk Factors: Confirmed PE 01/08/21 DVT LLE 01/08/21 (extensive) HX of COVID. Anticoagulation: Eliquis. Comparison Study: Previous 01/08/21 - LLE Positive CFV, FV, PopV, Gastroc, Soleal,                   PTV, PeroV Performing Technologist: Jody Hill RVT, RDMS  Examination Guidelines: A complete evaluation includes B-mode imaging, spectral Doppler, color Doppler, and power Doppler as needed of all accessible portions of each vessel. Bilateral testing is considered an integral part of a complete examination. Limited examinations for reoccurring indications may be performed as  noted. The reflux portion of the exam is performed with the patient in reverse Trendelenburg.  +-----+---------------+---------+-----------+----------+--------------+ RIGHTCompressibilityPhasicitySpontaneityPropertiesThrombus Aging +-----+---------------+---------+-----------+----------+--------------+ CFV  Full           Yes      Yes                                 +-----+---------------+---------+-----------+----------+--------------+   +---------+---------------+---------+-----------+----------+-------------------+ LEFT     CompressibilityPhasicitySpontaneityPropertiesThrombus Aging      +---------+---------------+---------+-----------+----------+-------------------+ CFV      Partial        Yes      Yes                  Chronic - residual                                                        clot                +---------+---------------+---------+-----------+----------+-------------------+ SFJ      Partial                                      Acute               +---------+---------------+---------+-----------+----------+-------------------+ FV Prox  Full           Yes      Yes                                      +---------+---------------+---------+-----------+----------+-------------------+ FV  Mid   Full           Yes      Yes                                      +---------+---------------+---------+-----------+----------+-------------------+ FV DistalFull           Yes      Yes                                      +---------+---------------+---------+-----------+----------+-------------------+ PFV      Partial        No       No                   Age Indeterminate   +---------+---------------+---------+-----------+----------+-------------------+ POP      Partial        No       No                   Acute               +---------+---------------+---------+-----------+----------+-------------------+ PTV      Partial        No        No                   Chronic             +---------+---------------+---------+-----------+----------+-------------------+ PERO     None           No       No                   Chronic             +---------+---------------+---------+-----------+----------+-------------------+ Soleal   None           No       No                   Chronic             +---------+---------------+---------+-----------+----------+-------------------+ Gastroc  Partial        No       Yes                                      +---------+---------------+---------+-----------+----------+-------------------+ GSV      Partial        No       No                   Acute - GSV origin                                                        Rouleaux flow seen                                                        past thrombus       +---------+---------------+---------+-----------+----------+-------------------+  EIV      Full           Yes      Yes                                      +---------+---------------+---------+-----------+----------+-------------------+ Patient underwent thrombectomy (CFV to PopV) for extensive clot found on 01/08/21. Newly developed thrombus found in SFJ, GSV, and PopV despite being on Eliquis.    Summary: RIGHT: - No evidence of common femoral vein obstruction.  LEFT: - Findings consistent with acute deep vein thrombosis involving the SF junction, and left popliteal vein. - Findings consistent with acute superficial vein thrombosis involving the left great saphenous vein. - Findings consistent with age indeterminate deep vein thrombosis involving the left proximal profunda vein. 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 - No cystic structure found in the popliteal fossa.  *See table(s) above for measurements and  observations. Electronically signed by Waverly Ferrari MD on 01/29/2021 at 12:18:05 PM.    Final     Procedures Procedures   Medications Ordered in ED Medications  HYDROcodone-acetaminophen (NORCO/VICODIN) 5-325 MG per tablet 1 tablet (has no administration in time range)  HYDROcodone-acetaminophen (NORCO/VICODIN) 5-325 MG per tablet 1 tablet (1 tablet Oral Given 01/29/21 1214)    ED Course  I have reviewed the triage vital signs and the nursing notes.  Pertinent labs & imaging results that were available during my care of the patient were reviewed by me and considered in my medical decision making (see chart for details).  Clinical Course as of 01/29/21 1507  Tue Jan 29, 2021  1342 Discussed with Dr Archer Asa.  Findings are not too different than prior studies. Although not improving much. [JK]  1343 Common femoral and femoral was cleared out on procedure. [JK]  1505 Discussed with Dr Clelia Croft.  No need to change medications at this time.  I did confirm pt is taking eliquis twice daily. [JK]    Clinical Course User Index [JK] Linwood Dibbles, MD   MDM Rules/Calculators/A&P                         Patient presented to the ED for evaluation of leg pain with recent DVT and PE diagnosis.  Patient had a repeat Doppler study performed this morning after ED visit last evening.  It did show evidence of venous thrombosis.  I discussed the case with Dr. Archer Asa who performed her venogram.  Her findings today are consistent with her most recent imaging test.  It does not appear that she has a new acute thrombosis.  I discussed case with Dr. Clelia Croft hematology.  He recommends continuing the Eliquis twice daily which the patient confirms she is taking.  He suspects her symptoms are related to residual discomfort from her known thrombosis.  I will give her referral to hematology for further outpatient hypercoagulability work-up.  Final Clinical Impression(s) / ED Diagnoses Final diagnoses:  Deep vein  thrombosis (DVT) of proximal vein of left lower extremity, unspecified chronicity (HCC)    Rx / DC Orders ED Discharge Orders     None        Linwood Dibbles, MD 01/29/21 1507

## 2021-01-29 NOTE — ED Triage Notes (Signed)
Pt sent to ED after + outpatient vascular US.  Reports L leg pain since yesterday.  Seen in ED.  Takes Eliquis.

## 2021-01-29 NOTE — ED Notes (Signed)
DC instructions reviewed with pt. Pt verbalized understanding.  Pt DC 

## 2021-01-29 NOTE — Progress Notes (Signed)
LLE venous duplex has been completed.  Patient taken to ED and preliminary findings given to triage nurse.  Results can be found under chart review under CV PROC. 01/29/2021 12:10 PM Mc Bloodworth RVT, RDMS

## 2021-01-29 NOTE — ED Notes (Signed)
Got patient into a gown on the monitor patient is resting with call bell in reach 

## 2021-02-05 ENCOUNTER — Telehealth: Payer: Self-pay

## 2021-02-05 ENCOUNTER — Emergency Department (HOSPITAL_BASED_OUTPATIENT_CLINIC_OR_DEPARTMENT_OTHER)
Admit: 2021-02-05 | Discharge: 2021-02-05 | Disposition: A | Discharge status: Home / Self Care | Payer: Self-pay | Attending: Emergency Medicine | Admitting: Emergency Medicine

## 2021-02-05 ENCOUNTER — Emergency Department (HOSPITAL_COMMUNITY): Payer: Self-pay

## 2021-02-05 ENCOUNTER — Other Ambulatory Visit: Payer: Self-pay

## 2021-02-05 ENCOUNTER — Emergency Department (HOSPITAL_COMMUNITY)
Admission: EM | Admit: 2021-02-05 | Discharge: 2021-02-06 | Disposition: A | Discharge status: Home / Self Care | Payer: Self-pay | Attending: Emergency Medicine | Admitting: Emergency Medicine

## 2021-02-05 DIAGNOSIS — I824Y2 Acute embolism and thrombosis of unspecified deep veins of left proximal lower extremity: Secondary | ICD-10-CM | POA: Insufficient documentation

## 2021-02-05 DIAGNOSIS — Z7901 Long term (current) use of anticoagulants: Secondary | ICD-10-CM | POA: Insufficient documentation

## 2021-02-05 DIAGNOSIS — M79605 Pain in left leg: Secondary | ICD-10-CM

## 2021-02-05 DIAGNOSIS — Z79899 Other long term (current) drug therapy: Secondary | ICD-10-CM | POA: Insufficient documentation

## 2021-02-05 DIAGNOSIS — Z20822 Contact with and (suspected) exposure to covid-19: Secondary | ICD-10-CM | POA: Insufficient documentation

## 2021-02-05 DIAGNOSIS — R0602 Shortness of breath: Secondary | ICD-10-CM | POA: Insufficient documentation

## 2021-02-05 DIAGNOSIS — I2699 Other pulmonary embolism without acute cor pulmonale: Secondary | ICD-10-CM | POA: Insufficient documentation

## 2021-02-05 DIAGNOSIS — F17210 Nicotine dependence, cigarettes, uncomplicated: Secondary | ICD-10-CM | POA: Insufficient documentation

## 2021-02-05 LAB — CBC WITH DIFFERENTIAL/PLATELET
Abs Immature Granulocytes: 0.03 10*3/uL (ref 0.00–0.07)
Basophils Absolute: 0 10*3/uL (ref 0.0–0.1)
Basophils Relative: 1 %
Eosinophils Absolute: 0.2 10*3/uL (ref 0.0–0.5)
Eosinophils Relative: 3 %
HCT: 44.9 % (ref 36.0–46.0)
Hemoglobin: 14.7 g/dL (ref 12.0–15.0)
Immature Granulocytes: 0 %
Lymphocytes Relative: 45 %
Lymphs Abs: 3.2 10*3/uL (ref 0.7–4.0)
MCH: 29.5 pg (ref 26.0–34.0)
MCHC: 32.7 g/dL (ref 30.0–36.0)
MCV: 90 fL (ref 80.0–100.0)
Monocytes Absolute: 0.6 10*3/uL (ref 0.1–1.0)
Monocytes Relative: 9 %
Neutro Abs: 2.9 10*3/uL (ref 1.7–7.7)
Neutrophils Relative %: 42 %
Platelets: 278 10*3/uL (ref 150–400)
RBC: 4.99 MIL/uL (ref 3.87–5.11)
RDW: 14.8 % (ref 11.5–15.5)
WBC: 7 10*3/uL (ref 4.0–10.5)
nRBC: 0 % (ref 0.0–0.2)

## 2021-02-05 LAB — COMPREHENSIVE METABOLIC PANEL
ALT: 23 U/L (ref 0–44)
AST: 16 U/L (ref 15–41)
Albumin: 3.9 g/dL (ref 3.5–5.0)
Alkaline Phosphatase: 88 U/L (ref 38–126)
Anion gap: 11 (ref 5–15)
BUN: 9 mg/dL (ref 6–20)
CO2: 21 mmol/L — ABNORMAL LOW (ref 22–32)
Calcium: 9.4 mg/dL (ref 8.9–10.3)
Chloride: 105 mmol/L (ref 98–111)
Creatinine, Ser: 0.78 mg/dL (ref 0.44–1.00)
GFR, Estimated: >60 mL/min (ref >60)
Glucose, Bld: 121 mg/dL — ABNORMAL HIGH (ref 70–99)
Potassium: 3.9 mmol/L (ref 3.5–5.1)
Sodium: 137 mmol/L (ref 135–145)
Total Bilirubin: 0.7 mg/dL (ref 0.3–1.2)
Total Protein: 7.2 g/dL (ref 6.5–8.1)

## 2021-02-05 LAB — PROTIME-INR
INR: 1 (ref 0.8–1.2)
Prothrombin Time: 13.3 seconds (ref 11.4–15.2)

## 2021-02-05 NOTE — ED Provider Notes (Signed)
Emergency Medicine Provider Triage Evaluation Note  [facility] , a 55 y.o. female  was evaluated in triage.  Pt complains of shortness of breath which began yesterday.  Also had a embolectomy of her left lower leg by Dr. Archer Asa approximately 3 weeks ago tomorrow.  Also was told previously that she had a DVT, which she was given Eliquis.  She feels that Eliquis is making her feel sick, with some nausea, feeling overall ill.  Review of Systems  Positive: Shortness of breath. Leg swelling Negative: Fever, cough,  Physical Exam  There were no vitals taken for this visit. Gen:   Awake, no distress   Resp:  Normal effort  MSK:   Moves extremities without difficulty  Other:  Pain with palpation along the left calf.  Incision noted clean and dry without erythema but slight lump noted.  Pulses are present, capillary refill is intact.  Medical Decision Making  Medically screening exam initiated at 4:24 PM.  Appropriate orders placed.  Lorane [place] was informed that the remainder of the evaluation will be completed by another provider, this initial triage assessment does not replace that evaluation, and the importance of remaining in the ED until their evaluation is complete.  DVT study order, lab work placed.   Claude Manges, PA-C 02/05/21 1628    Milagros Loll, MD 02/06/21 [phone]

## 2021-02-05 NOTE — Progress Notes (Signed)
Left lower extremity venous duplex has been completed. Preliminary results can be found in CV Proc through chart review.  Results were given to [employer] PA.  02/05/21 7:04 PM Olen Cordial RVT

## 2021-02-05 NOTE — ED Triage Notes (Signed)
Pt reports having surgery three weeks ago to remove dvt but now having recurrent L calf pain. 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 Also concerned she is having side effects from eliquis d/t constant nausea.

## 2021-02-06 ENCOUNTER — Emergency Department (HOSPITAL_COMMUNITY): Payer: Self-pay

## 2021-02-06 LAB — TROPONIN I (HIGH SENSITIVITY): Troponin I (High Sensitivity): <2 ng/L (ref <18)

## 2021-02-06 LAB — RESP PANEL BY RT-PCR (FLU A&B, COVID) ARPGX2
Influenza A by PCR: NEGATIVE
Influenza B by PCR: NEGATIVE
SARS Coronavirus 2 by RT PCR: NEGATIVE

## 2021-02-06 MED ORDER — MORPHINE SULFATE (PF) 4 MG/ML IV SOLN: 4.0000 mg | Freq: Once | INTRAVENOUS | Status: DC

## 2021-02-06 MED ORDER — IOHEXOL 350 MG/ML SOLN
62.0000 mL | Freq: Once | INTRAVENOUS | Status: AC | PRN
  Administered 2021-02-06: 62 mL via INTRAVENOUS

## 2021-02-06 MED ORDER — MORPHINE SULFATE (PF) 2 MG/ML IV SOLN
2.0000 mg | Freq: Once | INTRAVENOUS | Status: AC
  Administered 2021-02-06: 2 mg via INTRAVENOUS
  Filled 2021-02-06: qty 1

## 2021-02-06 MED ORDER — ONDANSETRON 4 MG PO TBDP: 4.0000 mg | ORAL_TABLET | Freq: Three times a day (TID) | ORAL | 0 refills | Status: DC | PRN

## 2021-02-06 MED ORDER — FENTANYL CITRATE (PF) 100 MCG/2ML IJ SOLN
50.0000 ug | INTRAMUSCULAR | Status: DC | PRN
  Administered 2021-02-06: 50 ug via INTRAVENOUS
  Filled 2021-02-06: qty 2

## 2021-02-06 MED ORDER — SODIUM CHLORIDE 0.9 % IV BOLUS
1000.0000 mL | Freq: Once | INTRAVENOUS | Status: AC
  Administered 2021-02-06: 1000 mL via INTRAVENOUS

## 2021-02-06 MED ORDER — ONDANSETRON HCL 4 MG/2ML IJ SOLN
4.0000 mg | Freq: Once | INTRAMUSCULAR | Status: AC
  Administered 2021-02-06: 4 mg via INTRAVENOUS

## 2021-02-06 MED ORDER — ACETAMINOPHEN 500 MG PO TABS
1000.0000 mg | ORAL_TABLET | Freq: Once | ORAL | Status: AC
  Administered 2021-02-06: 1000 mg via ORAL
  Filled 2021-02-06: qty 2

## 2021-02-06 MED ORDER — HYDROCODONE-ACETAMINOPHEN 5-325 MG PO TABS: 1.0000 | ORAL_TABLET | Freq: Three times a day (TID) | ORAL | 0 refills | Status: DC | PRN

## 2021-02-06 MED ORDER — ONDANSETRON HCL 4 MG/2ML IJ SOLN
2.0000 mg | Freq: Once | INTRAMUSCULAR | Status: DC
  Filled 2021-02-06: qty 2

## 2021-02-06 NOTE — ED Provider Notes (Signed)
[facility] EMERGENCY DEPARTMENT Provider Note   CSN: [id number] Arrival date & time: 02/05/21  1618     History Chief Complaint  Patient presents with   Shortness of Breath   Leg Pain    Kristina Owens is a 55 y.o. female history of DVT/PE on Eliquis, polysubstance abuse, alcohol abuse with withdrawal, anxiety, depression, GERD.  Patient underwent embolectomy of the left leg 3 weeks ago.  Patient presents to the ER yesterday afternoon for worsening shortness of breath, patient reports intermittent shortness of breath since her embolectomy 3 weeks ago but it became severe last night, no clear exacerbating factor, it was not exertional.  Patient reports a left-sided chest pain, stabbing, only occurs with deep inspiration, improves with rest, moderate in intensity.  Patient reports her symptoms are associated some nausea without vomiting that began yesterday.  Of note patient reports continued left lower leg pain since her embolectomy that has not changed in the past 3 weeks.  Denies fever/chills, fall/injury, abdominal pain, vomiting, diarrhea, extremity swelling/color change or any additional concerns today. HPI     Past Medical History:  Diagnosis Date   Anxiety    Depression    GERD (gastroesophageal reflux disease)    Ovarian cyst     Patient Active Problem List   Diagnosis Date Noted   Pulmonary embolism (HCC) 01/08/2021   DVT (deep venous thrombosis) (HCC) 01/08/2021   Polysubstance (excluding opioids) dependence (HCC) 01/08/2021   Alcohol abuse with withdrawal (HCC) 01/08/2021   Bipolar disorder, in full remission, most recent episode depressed (HCC) 01/18/2020   Generalized anxiety disorder 01/18/2020   Alcohol use disorder, severe, in early remission, dependence (HCC) 01/18/2020   Cocaine use disorder, severe, in early remission (HCC) 01/18/2020   Suicidal ideation    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     Family History  Problem Relation Age of Onset   Asthma Mother    COPD Mother    Cancer Father        thyroid cancer    Social History   Tobacco Use   Smoking status: Every Day    Packs/day: 0.50    Years: 35.00    Pack years: 17.50    Types: Cigarettes   Smokeless tobacco: Never  Vaping Use   Vaping Use: Never used  Substance Use Topics   Alcohol use: Yes   Drug use: Yes    Types: "Crack" cocaine, Cocaine, Methamphetamines    Comment: last used 4/31/22    Home Medications Prior to Admission medications   Medication Sig Start Date End Date Taking? Authorizing Provider  HYDROcodone-acetaminophen (NORCO/VICODIN) 5-325 MG tablet Take 1 tablet by mouth every 8 (eight) hours as needed for severe pain. 02/06/21  Yes Harlene Salts A, PA-C  ondansetron (ZOFRAN ODT) 4 MG disintegrating tablet Take 1 tablet (4 mg total) by mouth every 8 (eight) hours as needed for nausea  or vomiting. 02/06/21  Yes Harlene SaltsMorelli, Evangelynn Lochridge A, PA-C  acetaminophen (TYLENOL) 500 MG tablet Take 1,000 mg by mouth every 4 (four) hours as needed (for pain- ALTERNATING WITH IBUPROFEN).    [provider]  apixaban (ELIQUIS) 5 MG TABS tablet Take 1 tablet (5 mg total) by mouth 2 (two) times daily. 01/18/21   Regalado, Belkys A, MD  ARIPiprazole (ABILIFY) 10 MG tablet Take 1 tablet (10 mg total) by mouth daily. 01/19/21   Regalado, Belkys A, MD  busPIRone (BUSPAR) 10 MG tablet Take 1 tablet (10 mg total) by mouth 3 (three)  times daily. Patient taking differently: Take 10 mg by mouth 3 (three) times daily as needed (for anxiety). 01/18/21   Regalado, Belkys A, MD  folic acid (FOLVITE) 1 MG tablet Take 1 tablet (1 mg total) by mouth daily. Patient not taking: Reported on 01/29/2021 01/19/21   Regalado, Jon BillingsBelkys A, MD  ibuprofen (ADVIL) 200 MG tablet Take 800 mg by mouth every 4 (four) hours as needed (for pain- ALTERNATING WITH ACETAMINOPHEN).    [provider]  lamoTRIgine (LAMICTAL) 25 MG tablet Take 1 tablet (25 mg total) by mouth daily. 01/19/21   Regalado, Belkys A, MD  Multiple Vitamin (MULTIVITAMIN WITH MINERALS) TABS tablet Take 1 tablet by mouth daily. Patient not taking: Reported on 01/29/2021 01/19/21   Regalado, Jon BillingsBelkys A, MD  nicotine (NICODERM CQ - DOSED IN MG/24 HOURS) 21 mg/24hr patch Place 1 patch (21 mg total) onto the skin daily. Patient not taking: No sig reported 01/19/21   Regalado, Belkys A, MD  polyethylene glycol (MIRALAX / GLYCOLAX) 17 g packet Take 17 g by mouth daily as needed for mild constipation. Patient not taking: Reported on 01/29/2021 01/18/21   Regalado, Jon BillingsBelkys A, MD  sertraline (ZOLOFT) 50 MG tablet Take 1 tablet (50 mg total) by mouth daily. 01/18/21   Regalado, Prentiss BellsBelkys A, MD    Allergies    Patient has no known allergies.  Review of Systems   Review of Systems Ten systems are reviewed and are negative for acute change except as noted in the HPI  Physical Exam Updated Vital Signs BP (!) 114/97 (BP Location: Left Arm)   Pulse 62   Temp (!) 97.5 F (36.4 C) (Oral)   Resp 17   Ht 5\' 4"  (1.626 m)   Wt 68 kg   SpO2 100%   BMI 25.75 kg/m   Physical Exam Constitutional:      General: She is not in acute distress.    Appearance: Normal appearance. She is well-developed. She is not ill-appearing or diaphoretic.  HENT:     Head: Normocephalic and atraumatic.  Eyes:     General: Vision grossly intact. Gaze aligned appropriately.     Pupils: Pupils are equal, round, and  reactive to light.  Neck:     Trachea: Trachea and phonation normal.  Cardiovascular:     Rate and Rhythm: Normal rate and regular rhythm.  Pulmonary:     Effort: Pulmonary effort is normal. No respiratory distress.     Breath sounds: Normal breath sounds.  Abdominal:     General: There is no distension.     Palpations: Abdomen is soft.     Tenderness: There is no abdominal tenderness. There is no guarding or rebound.  Musculoskeletal:        General: Normal range of motion.     Cervical back: Normal range of motion.     Right lower leg: No edema.  Left lower leg: No edema.  Skin:    General: Skin is warm and dry.  Neurological:     Mental Status: She is alert.     GCS: GCS eye subscore is 4. GCS verbal subscore is 5. GCS motor subscore is 6.     Comments: Speech is clear and goal oriented, follows commands Major Cranial nerves without deficit, no facial droop Moves extremities without ataxia, coordination intact  Psychiatric:        Behavior: Behavior normal.    ED Results / Procedures / Treatments   Labs (all labs ordered are listed, but only abnormal results are displayed) Labs Reviewed  COMPREHENSIVE METABOLIC PANEL - Abnormal; Notable for the following components:      Result Value   CO2 21 (*)    Glucose, Bld 121 (*)    All other components within normal limits  RESP PANEL BY RT-PCR (FLU A&B, COVID) ARPGX2  CBC WITH DIFFERENTIAL/PLATELET  PROTIME-INR  TROPONIN I (HIGH SENSITIVITY)    EKG EKG Interpretation  Date/Time:  Tuesday February 05 2021 16:34:44 EDT Ventricular Rate:  103 PR Interval:  128 QRS Duration: 70 QT Interval:  328 QTC Calculation: 429 R Axis:   73 Text Interpretation: Sinus tachycardia Otherwise normal ECG Similar to prior Confirmed by Alona Bene [phone] on 02/06/2021 9:02:43 AM  Radiology DG Chest 1 View  Result Date: 02/05/2021 CLINICAL DATA:  Shortness of breath.  Weakness. EXAM: CHEST  1 VIEW COMPARISON:  01/14/2021. FINDINGS:  Mediastinum and hilar structures normal. Left base atelectasis and infiltrate. Follow-up exam suggested to demonstrate clearing and to ensure the absence of an underlying mass lesion. Small left pleural effusion cannot be excluded. No pneumothorax. Heart size normal. No acute bony abnormality. IMPRESSION: Left base atelectasis and infiltrate. Follow-up exam suggested to demonstrate clearing and to ensure the absence of an underlying mass lesion. Small left pleural effusion cannot be excluded. Electronically Signed   By: Maisie Fus  Register   On: 02/05/2021 17:00   CT Angio Chest PE W and/or Wo Contrast  Result Date: 02/06/2021 CLINICAL DATA:  Shortness of breath and chest pain. History of recent DVT, bilateral pulmonary emboli and thrombectomy left lower extremity DVT. EXAM: CT ANGIOGRAPHY CHEST WITH CONTRAST TECHNIQUE: Multidetector CT imaging of the chest was performed using the standard protocol during bolus administration of intravenous contrast. Multiplanar CT image reconstructions and MIPs were obtained to evaluate the vascular anatomy. CONTRAST:  54mL OMNIPAQUE IOHEXOL 350 MG/ML SOLN COMPARISON:  CT a of the chest on 01/08/2021 and on 01/19/2021 at [facility] FINDINGS: Cardiovascular: Continued resolution of bilateral pulmonary embolism since prior imaging last month. There is only a very small amount of residual nonocclusive mural thrombus in the left lower lobe pulmonary artery. Right-sided thrombus has nearly completely resolved with potentially minimal component of mural thrombus remaining in the posterior aspect of the right main pulmonary artery and a web in a posterior right lower lobe pulmonary artery branch. No evidence of central pulmonary artery dilatation or right heart strain. The heart size is normal. No pericardial fluid identified. The thoracic aorta is of normal caliber. Mediastinum/Nodes: No enlarged mediastinal, hilar, or axillary lymph nodes. Thyroid gland, trachea, and esophagus  demonstrate no significant findings. Lungs/Pleura: Stable atelectasis in the lingula. Some residual scarring is present in the posterior left lower lobe. Small left pleural effusion seen previously has resolved. There is no evidence of pulmonary edema, consolidation, pneumothorax, nodule or pleural fluid. Upper Abdomen: No acute abnormality. Musculoskeletal: No chest wall abnormality. No  acute or significant osseous findings. Review of the MIP images confirms the above findings. IMPRESSION: 1. Continued resolution of bilateral pulmonary embolism with only a minimal amount of nonocclusive mural thrombus remaining bilaterally. There is no evidence of acute pulmonary embolism. 2. No evidence to suggest cor pulmonale. 3. Resolution of previously seen small left pleural effusion. Electronically Signed   By: Irish Lack M.D.   On: 02/06/2021 08:44   VAS [place] LOWER EXTREMITY VENOUS (DVT) (ONLY [facility] & [facility] 7a-7p)  Result Date: 02/05/2021  Lower Venous DVT Study Patient Name:  Kristina Owens  Date of Exam:   02/05/2021 Medical Rec #: [id number]          Accession #:    [id number] Date of Birth: [birth year]         Patient Gender: F Patient Age:   31Y Exam Location:  [facility] Procedure:      VAS [place] LOWER EXTREMITY VENOUS (DVT) Referring Phys: [id number] Leonie Douglas SOTO --------------------------------------------------------------------------------  Indications: Pain.  Risk Factors: DVT Surgery 01/16/2021 - Left catheter directed mechanical thrombectomy with TPA of common femoral vein. Anticoagulation: Eliquis. Comparison Study: 01/29/2021 - RIGHT:                   - No evidence of common femoral vein obstruction.                    LEFT:                   - Findings consistent with acute deep vein thrombosis                   involving the SF junction, and left popliteal vein.                   - Findings consistent with acute superficial vein thrombosis                   involving the left great saphenous vein.                    - Findings consistent with age indeterminate deep vein                   thrombosis involving the left proximal profunda vein.                   - Findings consistent with chronic deep vein thrombosis                   involving the left common femoral vein, left posterior tibial                   veins, left peroneal veins, left soleal veins, and left                   gastrocnemius veins.                   - No cystic structure found in the popliteal fossa. Performing Technologist: Chanda Busing RVT  Examination Guidelines: A complete evaluation includes B-mode imaging, spectral Doppler, color Doppler, and power Doppler as needed of all accessible portions of each vessel. Bilateral testing is considered an integral part of a complete examination. Limited examinations for reoccurring indications may be performed as noted. The reflux portion of the exam is performed with the patient in reverse Trendelenburg.  +-----+---------------+---------+-----------+----------+--------------+ RIGHTCompressibilityPhasicitySpontaneityPropertiesThrombus Aging +-----+---------------+---------+-----------+----------+--------------+ CFV  Full  Yes      Yes                                 +-----+---------------+---------+-----------+----------+--------------+   +---------+---------------+---------+-----------+----------+-----------------+ LEFT     CompressibilityPhasicitySpontaneityPropertiesThrombus Aging    +---------+---------------+---------+-----------+----------+-----------------+ CFV      Partial        Yes      Yes                  Chronic           +---------+---------------+---------+-----------+----------+-----------------+ SFJ      Partial        No       No                   Chronic           +---------+---------------+---------+-----------+----------+-----------------+ FV Prox  Full                                                            +---------+---------------+---------+-----------+----------+-----------------+ FV Mid   Full                                                           +---------+---------------+---------+-----------+----------+-----------------+ FV DistalFull                                                           +---------+---------------+---------+-----------+----------+-----------------+ PFV      Partial        No       No                   Chronic           +---------+---------------+---------+-----------+----------+-----------------+ POP      Partial        Yes      Yes                  Acute             +---------+---------------+---------+-----------+----------+-----------------+ PTV      Partial                                      Age Indeterminate +---------+---------------+---------+-----------+----------+-----------------+ PERO     Partial                                      Age Indeterminate +---------+---------------+---------+-----------+----------+-----------------+ Soleal   Partial                                      Acute             +---------+---------------+---------+-----------+----------+-----------------+ Gastroc  Full                                                           +---------+---------------+---------+-----------+----------+-----------------+  GSV      Full                                                           +---------+---------------+---------+-----------+----------+-----------------+     Summary: RIGHT: - No evidence of common femoral vein obstruction.  LEFT: - Findings consistent with acute deep vein thrombosis involving the left popliteal vein, and left soleal veins. - Findings consistent with age indeterminate deep vein thrombosis involving the left posterior tibial veins, and left peroneal veins. - Findings consistent with chronic deep vein thrombosis involving the left common femoral vein, SF junction, and left  proximal profunda vein. - No cystic structure found in the popliteal fossa.  *See table(s) above for measurements and observations.    Preliminary     Procedures Procedures   Medications Ordered in ED Medications  fentaNYL (SUBLIMAZE) injection 50 mcg (50 mcg Intravenous Given 02/06/21 0841)  acetaminophen (TYLENOL) tablet 1,000 mg (1,000 mg Oral Given 02/06/21 0349)  sodium chloride 0.9 % bolus 1,000 mL (1,000 mLs Intravenous New Bag/Given 02/06/21 0749)  morphine 2 MG/ML injection 2 mg (2 mg Intravenous Given 02/06/21 0740)  iohexol (OMNIPAQUE) 350 MG/ML injection 62 mL (62 mLs Intravenous Contrast Given 02/06/21 0757)  ondansetron (ZOFRAN) injection 4 mg (4 mg Intravenous Given 02/06/21 0841)    ED Course  I have reviewed the triage vital signs and the nursing notes.  Pertinent labs & imaging results that were available during my care of the patient were reviewed by me and considered in my medical decision making (see chart for details).    MDM Rules/Calculators/A&P                         Additional history obtained from: Nursing notes from this visit. Review of electronic medical records.  Patient had ER visit on 01/29/2021, at that time she had presented to the ER after a outpatient Doppler study showing acute DVT of the left popliteal, left great saphenous and left proximal profunda vein, she was on Eliquis at that time patient was instructed to come to the ED.  ER provider consulted with Dr. Archer Asa who performed her venogram and also consulted with hematology Dr. Clelia Croft.  It was recommended that patient continue her Eliquis twice daily and patient was discharged with outpatient follow-up. -------------- I reviewed and interpreted labs which include: CBC within normal limits, no leukocytosis to suggest infectious process, no anemia or thrombocytopenia. CMP shows no emergent electrolyte derangement, AKI, LFT elevations or gap. INR is 1.0 Troponin negative COVID/Influenza panel  negative  CXR:  IMPRESSION:  Left base atelectasis and infiltrate. Follow-up exam suggested to  demonstrate clearing and to ensure the absence of an underlying mass  lesion. Small left pleural effusion cannot be excluded.   DVT Study:  Summary:  RIGHT:  - No evidence of common femoral vein obstruction.     LEFT:  - Findings consistent with acute deep vein thrombosis involving the left popliteal vein, and left soleal veins.  - Findings consistent with age indeterminate deep vein thrombosis involving the left posterior tibial veins, and left peroneal veins.  - Findings consistent with chronic deep vein thrombosis involving the left common femoral vein, SF junction, and left proximal profunda vein.  - No cystic structure found in the popliteal fossa.  CTA Chest:  IMPRESSION:  1. Continued resolution of bilateral pulmonary embolism with only a  minimal amount of nonocclusive mural thrombus remaining bilaterally.  There is no evidence of acute pulmonary embolism.  2. No evidence to suggest cor pulmonale.  3. Resolution of previously seen small left pleural effusion.   EKG: Sinus tachycardia Otherwise normal ECG Similar to prior Confirmed by Alona Bene [phone] on 02/06/2021 9:02:43 AM --------------- Patient was reassessed he is resting company bed no acute distress vital signs stable on room air.  No abdominal tenderness on exam.  No active chest pain.  Work-up above is reassuring.  Suspect patient's left-sided chest pain which has been ongoing since previous pulmonary embolism is residual and expected after previous PE her CTA appears to be improving.  There is no indication for admission at this time.  Additionally low suspicion for ACS, dissection, bacterial pneumonia or other emergent pathology requiring further ER work-up or treatment at this time.  Patient reports that she is unhappy with her Eliquis and it causes some nausea without vomiting.  She has a follow-up appointment with her  vascular surgeon in less than 1 week and is also seeing hematology this week for further evaluation and treatment.  I encouraged the patient to call both her vascular surgeon and hematology today to inform them of her ER visit and discuss medication changes.  I have prescribed the patient 4 mg ODT Zofran to help with nausea, she reports she is taking his medication the past without adverse effect and her QTC is not prolonged during this visit today.  Additionally patient reports continued pain over the past 3 weeks from her left leg, she does have an acute DVT involving the left popliteal vein but this was also seen during her ER visit on 01/29/2021, patient encouraged to continue taking her Eliquis as prescribed until her follow-up with vascular surgery and PCP.  I reviewed PDMP and patient last narcotic prescription was on 01/28/2021 and she received 10 pills of Norco without refill.  Given her continued pain from her DVT I prescribed the patient 5 pills Norco without refill.  I discussed narcotic precautions with the patient and she stated understanding.  Patient has a ride home from the ER today.  Patient's case and results were reviewed with attending physician Dr. Jacqulyn Bath reviewed who agrees with discharge and outpatient follow-up at this time.   At this time there does not appear to be any evidence of an acute emergency medical condition and the patient appears stable for discharge with appropriate outpatient follow up. Diagnosis was discussed with patient who verbalizes understanding of care plan and is agreeable to discharge. I have discussed return precautions with patient who verbalizes understanding. Patient encouraged to follow-up with their PCP, hematology and vascular surgery.  All questions answered.  Note: Portions of this report may have been transcribed using voice recognition software. Every effort was made to ensure accuracy; however, inadvertent computerized transcription errors may still be  present.  Final Clinical Impression(s) / ED Diagnoses Final diagnoses:  Pulmonary embolism without acute cor pulmonale, unspecified chronicity, unspecified pulmonary embolism type (HCC)  Deep vein thrombosis (DVT) of proximal vein of left lower extremity, unspecified chronicity (HCC)    Rx / DC Orders ED Discharge Orders          Ordered    HYDROcodone-acetaminophen (NORCO/VICODIN) 5-325 MG tablet  Every 8 hours PRN        02/06/21 0920    ondansetron (ZOFRAN ODT) 4 MG disintegrating tablet  Every 8 hours PRN        02/06/21 0920             Elizabeth Palau 02/06/21 1610    Maia Plan, MD 02/11/21 [phone]

## 2021-02-06 NOTE — Discharge Instructions (Addendum)
At this time there does not appear to be the presence of an emergent medical condition, however there is always the potential for conditions to change. Please read and follow the below instructions.  Please return to the Emergency Department immediately for any new or worsening symptoms. Please be sure to follow up with your Primary Care Provider within one week regarding your visit today; please call their office to schedule an appointment even if you are feeling better for a follow-up visit. You may take the medication Norco (Hydrocodone/Acetaminophen) as prescribed to help with severe pain.  This medication will make you drowsy so do not drive, drink alcohol, take other sedating medications or perform any dangerous activities such as driving after taking Norco. Norco contains Tylenol (acetaminophen) so do not take any other Tylenol-containing products with Norco. You may use the medication Zofran as prescribed to help with nausea.  Zofran should dissolve under your tongue so do not chew or swallow it. Please talk with your primary care provider and vascular surgeon about potentially changing your Eliquis medication to a different blood thinner.  Call their offices today to discuss your ER visit and to schedule a follow-up appointment.  Go to the nearest Emergency Department immediately if: You have fever or chills You have signs or symptoms that a blood clot has moved to the lungs. These may include: Shortness of breath. Chest pain. Fast or irregular heartbeats (palpitations). Light-headedness or dizziness. Coughing up blood. You have signs or symptoms that your blood is too thin. These may include: Blood in your vomit, stool, or urine. A cut that will not stop bleeding. A menstrual period that is heavier than usual. A severe headache or confusion. You have any new/concerning or worsening of symptoms   Please read the additional information packets attached to your discharge summary.  Do  not take your medicine if  develop an itchy rash, swelling in your mouth or lips, or difficulty breathing; call 911 and seek immediate emergency medical attention if this occurs.  You may review your lab tests and imaging results in their entirety on your MyChart account.  Please discuss all results of fully with your primary care provider and other specialist at your follow-up visit.  Note: Portions of this text may have been transcribed using voice recognition software. Every effort was made to ensure accuracy; however, inadvertent computerized transcription errors may still be present.

## 2021-02-07 ENCOUNTER — Encounter: Payer: Self-pay | Admitting: Nurse Practitioner

## 2021-02-07 ENCOUNTER — Other Ambulatory Visit: Payer: Self-pay

## 2021-02-07 ENCOUNTER — Ambulatory Visit (INDEPENDENT_AMBULATORY_CARE_PROVIDER_SITE_OTHER): Payer: Medicaid Other | Admitting: Nurse Practitioner

## 2021-02-07 VITALS — BP 105/59 | HR 69 | Temp 97.2°F | Ht 64.0 in | Wt 146.1 lb

## 2021-02-07 DIAGNOSIS — Z7901 Long term (current) use of anticoagulants: Secondary | ICD-10-CM

## 2021-02-07 DIAGNOSIS — R82998 Other abnormal findings in urine: Secondary | ICD-10-CM

## 2021-02-07 DIAGNOSIS — I824Y2 Acute embolism and thrombosis of unspecified deep veins of left proximal lower extremity: Secondary | ICD-10-CM

## 2021-02-07 DIAGNOSIS — I2602 Saddle embolus of pulmonary artery with acute cor pulmonale: Secondary | ICD-10-CM

## 2021-02-07 DIAGNOSIS — M79605 Pain in left leg: Secondary | ICD-10-CM

## 2021-02-07 DIAGNOSIS — Z7689 Persons encountering health services in other specified circumstances: Secondary | ICD-10-CM

## 2021-02-07 DIAGNOSIS — R5383 Other fatigue: Secondary | ICD-10-CM

## 2021-02-07 LAB — POCT URINALYSIS DIPSTICK
Bilirubin, UA: NEGATIVE
Blood, UA: NEGATIVE
Glucose, UA: NEGATIVE
Ketones, UA: NEGATIVE
Nitrite, UA: NEGATIVE
Protein, UA: NEGATIVE
Spec Grav, UA: >=1.03 — AB (ref 1.010–1.025)
Urobilinogen, UA: 0.2 E.U./dL
pH, UA: 5 (ref 5.0–8.0)

## 2021-02-07 MED ORDER — XARELTO VTE STARTER PACK 15 & 20 MG PO TBPK: ORAL_TABLET | ORAL | 0 refills | Status: DC

## 2021-02-07 NOTE — Patient Instructions (Signed)
Health Maintenance, Female Adopting a healthy lifestyle and getting preventive care are important in promoting health and wellness. Ask your health care provider about: The right schedule for you to have regular tests and exams. Things you can do on your own to prevent diseases and keep yourself healthy. What should I know about diet, weight, and exercise? Eat a healthy diet  Eat a diet that includes plenty of vegetables, fruits, low-fat dairy products, and lean protein. Do not eat a lot of foods that are high in solid fats, added sugars, or sodium.  Maintain a healthy weight Body mass index (BMI) is used to identify weight problems. It estimates body fat based on height and weight. Your health care provider can help determineyour BMI and help you achieve or maintain a healthy weight. Get regular exercise Get regular exercise. This is one of the most important things you can do for your health. Most adults should: Exercise for at least 150 minutes each week. The exercise should increase your heart rate and make you sweat (moderate-intensity exercise). Do strengthening exercises at least twice a week. This is in addition to the moderate-intensity exercise. Spend less time sitting. Even light physical activity can be beneficial. Watch cholesterol and blood lipids Have your blood tested for lipids and cholesterol at 54 years of age, then havethis test every 5 years. Have your cholesterol levels checked more often if: Your lipid or cholesterol levels are high. You are older than 54 years of age. You are at high risk for heart disease. What should I know about cancer screening? Depending on your health history and family history, you may need to have cancer screening at various ages. This may include screening for: Breast cancer. Cervical cancer. Colorectal cancer. Skin cancer. Lung cancer. What should I know about heart disease, diabetes, and high blood pressure? Blood pressure and heart  disease High blood pressure causes heart disease and increases the risk of stroke. This is more likely to develop in people who have high blood pressure readings, are of African descent, or are overweight. Have your blood pressure checked: Every 3-5 years if you are 18-39 years of age. Every year if you are 40 years old or older. Diabetes Have regular diabetes screenings. This checks your fasting blood sugar level. Have the screening done: Once every three years after age 40 if you are at a normal weight and have a low risk for diabetes. More often and at a younger age if you are overweight or have a high risk for diabetes. What should I know about preventing infection? Hepatitis B If you have a higher risk for hepatitis B, you should be screened for this virus. Talk with your health care provider to find out if you are at risk forhepatitis B infection. Hepatitis C Testing is recommended for: Everyone born from 1945 through 1965. Anyone with known risk factors for hepatitis C. Sexually transmitted infections (STIs) Get screened for STIs, including gonorrhea and chlamydia, if: You are sexually active and are younger than 54 years of age. You are older than 54 years of age and your health care provider tells you that you are at risk for this type of infection. Your sexual activity has changed since you were last screened, and you are at increased risk for chlamydia or gonorrhea. Ask your health care provider if you are at risk. Ask your health care provider about whether you are at high risk for HIV. Your health care provider may recommend a prescription medicine to help   prevent HIV infection. If you choose to take medicine to prevent HIV, you should first get tested for HIV. You should then be tested every 3 months for as long as you are taking the medicine. Pregnancy If you are about to stop having your period (premenopausal) and you may become pregnant, seek counseling before you get  pregnant. Take 400 to 800 micrograms (mcg) of folic acid every day if you become pregnant. Ask for birth control (contraception) if you want to prevent pregnancy. Osteoporosis and menopause Osteoporosis is a disease in which the bones lose minerals and strength with aging. This can result in bone fractures. If you are 25 years old or older, or if you are at risk for osteoporosis and fractures, ask your health care provider if you should: Be screened for bone loss. Take a calcium or vitamin D supplement to lower your risk of fractures. Be given hormone replacement therapy (HRT) to treat symptoms of menopause. Follow these instructions at home: Lifestyle Do not use any products that contain nicotine or tobacco, such as cigarettes, e-cigarettes, and chewing tobacco. If you need help quitting, ask your health care provider. Do not use street drugs. Do not share needles. Ask your health care provider for help if you need support or information about quitting drugs. Alcohol use Do not drink alcohol if: Your health care provider tells you not to drink. You are pregnant, may be pregnant, or are planning to become pregnant. If you drink alcohol: Limit how much you use to 0-1 drink a day. Limit intake if you are breastfeeding. Be aware of how much alcohol is in your drink. In the [place] one drink equals one 12 oz bottle of beer (355 mL), one 5 oz glass of wine (148 mL), or one 1 oz glass of hard liquor (44 mL). General instructions Schedule regular health, dental, and eye exams. Stay current with your vaccines. Tell your health care provider if: You often feel depressed. You have ever been abused or do not feel safe at home. Summary Adopting a healthy lifestyle and getting preventive care are important in promoting health and wellness. Follow your health care provider's instructions about healthy diet, exercising, and getting tested or screened for diseases. Follow your health care provider's  instructions on monitoring your cholesterol and blood pressure. This information is not intended to replace advice given to you by your health care provider. Make sure you discuss any questions you have with your healthcare provider. Document Revised: 07/14/2018 Document Reviewed: 07/14/2018 Elsevier Patient Education  2022 [place]. Fatigue If you have fatigue, you feel tired all the time and have a lack of energy or a lack of motivation. Fatigue may make it difficult to start or complete tasks because of exhaustion. In general, occasional or mild fatigue is often a normal response to activity or life. However, long-lasting (chronic) or extreme fatigue may be a symptom of a medical condition. Follow these instructions at home: General instructions Watch your fatigue for any changes. Go to bed and get up at the same time every day. Avoid fatigue by pacing yourself during the day and getting enough sleep at night. Maintain a healthy weight. Medicines Take over-the-counter and prescription medicines only as told by your health care provider. Take a multivitamin, if told by your health care provider.  Do not use herbal or dietary supplements unless they are approved by your health care provider. Activity  Exercise regularly, as told by your health care provider. Use or practice techniques to help  you relax, such as yoga, tai chi, meditation, or massage therapy.  Eating and drinking  Avoid heavy meals in the evening. Eat a well-balanced diet, which includes lean proteins, whole grains, plenty of fruits and vegetables, and low-fat dairy products. Avoid consuming too much caffeine. Avoid the use of alcohol. Drink enough fluid to keep your urine pale yellow.  Lifestyle Change situations that cause you stress. Try to keep your work and personal schedule in balance. Do not use any products that contain nicotine or tobacco, such as cigarettes and e-cigarettes. If you need help quitting, ask  your health care provider. Do not use drugs. Contact a health care provider if: Your fatigue does not get better. You have a fever. You suddenly lose or gain weight. You have headaches. You have trouble falling asleep or sleeping through the night. You feel angry, guilty, anxious, or sad. You are unable to have a bowel movement (constipation). Your skin is dry. You have swelling in your legs or another part of your body. Get help right away if: You feel confused. Your vision is blurry. You feel faint or you pass out. You have a severe headache. You have severe pain in your abdomen, your back, or the area between your waist and hips (pelvis). You have chest pain, shortness of breath, or an irregular or fast heartbeat. You are unable to urinate, or you urinate less than normal. You have abnormal bleeding, such as bleeding from the rectum, vagina, nose, lungs, or nipples. You vomit blood. You have thoughts about hurting yourself or others. If you ever feel like you may hurt yourself or others, or have thoughts about taking your own life, get help right away. You can go to your nearest emergency department or call: Your local emergency services (911 in the [place] A suicide crisis helpline, such as the National Suicide Prevention Lifeline at 1-800-273-8255. This is open 24 hours a day. Summary If you have fatigue, you feel tired all the time and have a lack of energy or a lack of motivation. Fatigue may make it difficult to start or complete tasks because of exhaustion. Long-lasting (chronic) or extreme fatigue may be a symptom of a medical condition. Exercise regularly, as told by your health care provider. Change situations that cause you stress. Try to keep your work and personal schedule in balance. This information is not intended to replace advice given to you by your health care provider. Make sure you discuss any questions you have with your healthcare provider. Document Revised:  05/31/2020 Document Reviewed: 05/31/2020 Elsevier Patient Education  2022 Elsevier Inc.   

## 2021-02-07 NOTE — Progress Notes (Signed)
[facility] Patient [facility] [address] [place], [place]  [address] Phone:  [phone]   Fax:  [phone]   New Patient Office Visit  Subjective:  Patient ID: Kristina Owens, female    DOB: 1966/01/07  Age: 55 y.o. MRN: [id number]  CC:  Chief Complaint  Patient presents with   New Patient (Initial Visit)    PE, multiple hospital visits. First hospitalization  was 6/7  Requesting thyroid levels checked.     HPI Kristina Owens presents establish care. She  has a past medical history of Anxiety, Depression, GERD (gastroesophageal reflux disease), and Ovarian cyst.   She reports she was recently seen in the emergency room for left leg pain and was diagnosed with additional blood clots.  This is with no known etiology.  She does report COVID-19 infection x2.  She reports that she has been taking the Eliquis as directed.  However, she feels like thie Eliquis is making her sick. She has nausea. She is wondering if this can be switched. She has not income. She is getting financial help from family. She is concern about ongoing treatment for the pain in her leg due to the blood clots. She reports that she lives in [place] [facility] and is going to establish care with community clinic that offers behavioral health services.  She is unsure if the Eliquis will be available for her at a reduced cost.  She has a history of substance abuse.  She is seeking treatment for her leg pain.  She is willing to be seen with pain management.  Past Medical History:  Diagnosis Date   Anxiety    Depression    GERD (gastroesophageal reflux disease)    Ovarian cyst     Past Surgical History:  Procedure Laterality Date   APPENDECTOMY  age 59   IR PTA VENOUS EXCEPT DIALYSIS CIRCUIT  01/16/2021   IR THROMBECT VENO MECH MOD SED  01/16/2021   IR US GUIDE VASC ACCESS LEFT  01/16/2021   IR VENO/EXT/UNI LEFT  01/16/2021   IR VENOCAVAGRAM IVC  01/16/2021   TONSILLECTOMY Bilateral age 56    Family History   Problem Relation Age of Onset   Asthma Mother    COPD Mother    Cancer Father        thyroid cancer    Social History   Socioeconomic History   Marital status: Divorced    Spouse name: Not on file   Number of children: Not on file   Years of education: Not on file   Highest education level: Not on file  Occupational History   Not on file  Tobacco Use   Smoking status: Every Day    Packs/day: 0.50    Years: 35.00    Pack years: 17.50    Types: Cigarettes   Smokeless tobacco: Never  Vaping Use   Vaping Use: Never used  Substance and Sexual Activity   Alcohol use: Yes   Drug use: Yes    Types: "Crack" cocaine, Cocaine, Methamphetamines    Comment: last used 4/31/22   Sexual activity: Not on file  Other Topics Concern   Not on file  Social History Narrative   Not on file   Social Determinants of Health   Financial Resource Strain: Not on file  Food Insecurity: Not on file  Transportation Needs: Not on file  Physical Activity: Not on file  Stress: Not on file  Social Connections: Not on file  Intimate Partner Violence: Not  on file    ROS Review of Systems  Cardiovascular:  Positive for chest pain.  Musculoskeletal:        Calf and upper thigh   Objective:   Today's Vitals: BP (!) 105/59   Pulse 69   Temp (!) 97.2 F (36.2 C)   Ht 5\' 4"  (1.626 m)   Wt 146 lb 0.8 oz (66.2 kg)   SpO2 99%   BMI 25.07 kg/m   Physical Exam Constitutional:      General: She is not in acute distress.    Appearance: She is normal weight. She is not ill-appearing, toxic-appearing or diaphoretic.  HENT:     Head: Normocephalic and atraumatic.     Right Ear: Tympanic membrane normal.     Left Ear: Tympanic membrane normal.  Cardiovascular:     Rate and Rhythm: Normal rate and regular rhythm.     Pulses: Normal pulses.     Heart sounds: Normal heart sounds.  Pulmonary:     Effort: Pulmonary effort is normal.     Breath sounds: Normal breath sounds.  Abdominal:      General: Bowel sounds are normal.     Palpations: Abdomen is soft.  Musculoskeletal:        General: Normal range of motion.     Cervical back: Normal range of motion.     Comments: Slightly guarded gait  Skin:    General: Skin is warm and dry.     Capillary Refill: Capillary refill takes less than 2 seconds.  Neurological:     General: No focal deficit present.     Mental Status: She is alert and oriented to person, place, and time.  Psychiatric:        Mood and Affect: Mood normal.        Behavior: Behavior normal.        Thought Content: Thought content normal.        Judgment: Judgment normal.    Assessment & Plan:   Problem List Items Addressed This Visit       Cardiovascular and Mediastinum   Pulmonary embolism (HCC) Stable on anticoagulant therapy minimal breathing issues   DVT (deep venous thrombosis) (HCC) Recurrent encourage patient to stay on Eliquis therapy as directed we will continue to monitor closely   Relevant Orders   Comp. Metabolic Panel (12) (Completed)   Other Visit Diagnoses     Anticoagulant long-term use    Persistent  Eliquis 120 tablets (samples provided)   Relevant Orders   Comp. Metabolic Panel (12) (Completed)   CBC with Differential/Platelet (Completed)   Fatigue, unspecified type     Worsening would like evaluation Labs pending   Relevant Orders   TSH (Completed)   Left leg pain     Worsening would like a referral to pain management for further evaluation   Relevant Orders   Ambulatory referral to Pain Clinic   Encounter to establish care     Discussed female health maintenance; SBE, annual CBE, PAP test Discussed general safety in vehicle and COVID Discussed regular hydration with water Discussed healthy diet and exercise and weight management Discussed sexual health  Discussed mental health Encouraged to call our office for an appointment with in ongoing concerns for questions.     Relevant Orders   Urinalysis Dipstick  (Completed)   Urine white blood cells increased       Relevant Orders   Urine Culture (Completed)       Outpatient Encounter Medications as of  02/07/2021  Medication Sig   acetaminophen (TYLENOL) 500 MG tablet Take 1,000 mg by mouth every 4 (four) hours as needed (for pain- ALTERNATING WITH IBUPROFEN).   apixaban (ELIQUIS) 5 MG TABS tablet Take 1 tablet (5 mg total) by mouth 2 (two) times daily.   ARIPiprazole (ABILIFY) 10 MG tablet Take 1 tablet (10 mg total) by mouth daily.   busPIRone (BUSPAR) 10 MG tablet Take 1 tablet (10 mg total) by mouth 3 (three) times daily. (Patient taking differently: Take 10 mg by mouth 3 (three) times daily as needed (for anxiety).)   HYDROcodone-acetaminophen (NORCO/VICODIN) 5-325 MG tablet Take 1 tablet by mouth every 8 (eight) hours as needed for severe pain.   ibuprofen (ADVIL) 200 MG tablet Take 800 mg by mouth every 4 (four) hours as needed (for pain- ALTERNATING WITH ACETAMINOPHEN).   lamoTRIgine (LAMICTAL) 25 MG tablet Take 1 tablet (25 mg total) by mouth daily.   sertraline (ZOLOFT) 50 MG tablet Take 1 tablet (50 mg total) by mouth daily.   [DISCONTINUED] Rivaroxaban Stater Pack, 15 mg and 20 mg, (XARELTO STARTER PACK) Follow package directions: Take one 15mg  tablet by mouth twice a day. On day 22, switch to one 20mg  tablet once a day. Take with food.   ondansetron (ZOFRAN ODT) 4 MG disintegrating tablet Take 1 tablet (4 mg total) by mouth every 8 (eight) hours as needed for nausea or vomiting. (Patient not taking: Reported on 02/07/2021)   [DISCONTINUED] folic acid (FOLVITE) 1 MG tablet Take 1 tablet (1 mg total) by mouth daily. (Patient not taking: No sig reported)   [DISCONTINUED] Multiple Vitamin (MULTIVITAMIN WITH MINERALS) TABS tablet Take 1 tablet by mouth daily. (Patient not taking: Reported on 01/29/2021)   [DISCONTINUED] nicotine (NICODERM CQ - DOSED IN MG/24 HOURS) 21 mg/24hr patch Place 1 patch (21 mg total) onto the skin daily. (Patient not  taking: No sig reported)   [DISCONTINUED] polyethylene glycol (MIRALAX / GLYCOLAX) 17 g packet Take 17 g by mouth daily as needed for mild constipation. (Patient not taking: Reported on 01/29/2021)   No facility-administered encounter medications on file as of 02/07/2021.    Follow-up: Return in about 3 months (around 05/10/2021) for FU / faigue DVT 04/10/2021.   07/10/2021, NP

## 2021-02-09 LAB — CBC WITH DIFFERENTIAL/PLATELET
Basophils Absolute: 0 10*3/uL (ref 0.0–0.2)
Basos: 0 %
EOS (ABSOLUTE): 0.2 10*3/uL (ref 0.0–0.4)
Eos: 3 %
Hematocrit: 42.5 % (ref 34.0–46.6)
Hemoglobin: 14.3 g/dL (ref 11.1–15.9)
Immature Grans (Abs): 0 10*3/uL (ref 0.0–0.1)
Immature Granulocytes: 0 %
Lymphocytes Absolute: 2.6 10*3/uL (ref 0.7–3.1)
Lymphs: 39 %
MCH: 29.4 pg (ref 26.6–33.0)
MCHC: 33.6 g/dL (ref 31.5–35.7)
MCV: 87 fL (ref 79–97)
Monocytes Absolute: 0.6 10*3/uL (ref 0.1–0.9)
Monocytes: 8 %
Neutrophils Absolute: 3.3 10*3/uL (ref 1.4–7.0)
Neutrophils: 50 %
Platelets: 250 10*3/uL (ref 150–450)
RBC: 4.86 x10E6/uL (ref 3.77–5.28)
RDW: 15.2 % (ref 11.7–15.4)
WBC: 6.7 10*3/uL (ref 3.4–10.8)

## 2021-02-09 LAB — COMP. METABOLIC PANEL (12)
AST: 14 IU/L (ref 0–40)
Albumin/Globulin Ratio: 2.2 (ref 1.2–2.2)
Albumin: 4.8 g/dL (ref 3.8–4.9)
Alkaline Phosphatase: 101 IU/L (ref 44–121)
BUN/Creatinine Ratio: 13 (ref 9–23)
BUN: 10 mg/dL (ref 6–24)
Bilirubin Total: 0.2 mg/dL (ref 0.0–1.2)
Calcium: 9.4 mg/dL (ref 8.7–10.2)
Chloride: 104 mmol/L (ref 96–106)
Creatinine, Ser: 0.79 mg/dL (ref 0.57–1.00)
Globulin, Total: 2.2 g/dL (ref 1.5–4.5)
Glucose: 82 mg/dL (ref 65–99)
Potassium: 4.8 mmol/L (ref 3.5–5.2)
Sodium: 141 mmol/L (ref 134–144)
Total Protein: 7 g/dL (ref 6.0–8.5)
eGFR: 89 mL/min/{1.73_m2} (ref >59)

## 2021-02-09 LAB — URINE CULTURE

## 2021-02-09 LAB — TSH: TSH: 1.25 u[IU]/mL (ref 0.450–4.500)

## 2021-02-12 ENCOUNTER — Other Ambulatory Visit: Payer: Medicaid Other

## 2021-02-19 DIAGNOSIS — R791 Abnormal coagulation profile: Secondary | ICD-10-CM | POA: Insufficient documentation

## 2021-02-19 DIAGNOSIS — Z7901 Long term (current) use of anticoagulants: Secondary | ICD-10-CM | POA: Insufficient documentation

## 2021-02-19 NOTE — Telephone Encounter (Signed)
Error

## 2021-02-25 ENCOUNTER — Emergency Department (HOSPITAL_COMMUNITY)
Admission: EM | Admit: 2021-02-25 | Discharge: 2021-02-25 | Disposition: A | Discharge status: Home / Self Care | Payer: Self-pay | Attending: Emergency Medicine | Admitting: Emergency Medicine

## 2021-02-25 ENCOUNTER — Emergency Department (HOSPITAL_BASED_OUTPATIENT_CLINIC_OR_DEPARTMENT_OTHER): Admit: 2021-02-25 | Discharge: 2021-02-25 | Disposition: A | Discharge status: Home / Self Care | Payer: Self-pay

## 2021-02-25 ENCOUNTER — Ambulatory Visit: Payer: Self-pay | Admitting: Nurse Practitioner

## 2021-02-25 ENCOUNTER — Emergency Department (HOSPITAL_COMMUNITY)
Admission: EM | Admit: 2021-02-25 | Discharge: 2021-02-25 | Disposition: A | Discharge status: Home / Self Care | Payer: Medicaid Other | Attending: Emergency Medicine | Admitting: Emergency Medicine

## 2021-02-25 ENCOUNTER — Other Ambulatory Visit: Payer: Self-pay

## 2021-02-25 DIAGNOSIS — M7989 Other specified soft tissue disorders: Secondary | ICD-10-CM

## 2021-02-25 DIAGNOSIS — M79604 Pain in right leg: Secondary | ICD-10-CM | POA: Insufficient documentation

## 2021-02-25 DIAGNOSIS — R0602 Shortness of breath: Secondary | ICD-10-CM | POA: Insufficient documentation

## 2021-02-25 DIAGNOSIS — Z20822 Contact with and (suspected) exposure to covid-19: Secondary | ICD-10-CM | POA: Insufficient documentation

## 2021-02-25 DIAGNOSIS — M79605 Pain in left leg: Secondary | ICD-10-CM | POA: Insufficient documentation

## 2021-02-25 DIAGNOSIS — Z7901 Long term (current) use of anticoagulants: Secondary | ICD-10-CM | POA: Insufficient documentation

## 2021-02-25 DIAGNOSIS — Z5321 Procedure and treatment not carried out due to patient leaving prior to being seen by health care provider: Secondary | ICD-10-CM | POA: Insufficient documentation

## 2021-02-25 DIAGNOSIS — R2242 Localized swelling, mass and lump, left lower limb: Secondary | ICD-10-CM | POA: Insufficient documentation

## 2021-02-25 DIAGNOSIS — F17210 Nicotine dependence, cigarettes, uncomplicated: Secondary | ICD-10-CM | POA: Insufficient documentation

## 2021-02-25 DIAGNOSIS — R791 Abnormal coagulation profile: Secondary | ICD-10-CM | POA: Insufficient documentation

## 2021-02-25 LAB — CBC WITH DIFFERENTIAL/PLATELET
Abs Immature Granulocytes: 0.03 10*3/uL (ref 0.00–0.07)
Basophils Absolute: 0 10*3/uL (ref 0.0–0.1)
Basophils Relative: 0 %
Eosinophils Absolute: 0.1 10*3/uL (ref 0.0–0.5)
Eosinophils Relative: 2 %
HCT: 43.4 % (ref 36.0–46.0)
Hemoglobin: 14.1 g/dL (ref 12.0–15.0)
Immature Granulocytes: 0 %
Lymphocytes Relative: 49 %
Lymphs Abs: 3.2 10*3/uL (ref 0.7–4.0)
MCH: 29.1 pg (ref 26.0–34.0)
MCHC: 32.5 g/dL (ref 30.0–36.0)
MCV: 89.5 fL (ref 80.0–100.0)
Monocytes Absolute: 0.5 10*3/uL (ref 0.1–1.0)
Monocytes Relative: 7 %
Neutro Abs: 2.8 10*3/uL (ref 1.7–7.7)
Neutrophils Relative %: 42 %
Platelets: 369 10*3/uL (ref 150–400)
RBC: 4.85 MIL/uL (ref 3.87–5.11)
RDW: 14.4 % (ref 11.5–15.5)
WBC: 6.7 10*3/uL (ref 4.0–10.5)
nRBC: 0 % (ref 0.0–0.2)

## 2021-02-25 LAB — COMPREHENSIVE METABOLIC PANEL
ALT: 41 U/L (ref 0–44)
AST: 47 U/L — ABNORMAL HIGH (ref 15–41)
Albumin: 4.7 g/dL (ref 3.5–5.0)
Alkaline Phosphatase: 87 U/L (ref 38–126)
Anion gap: 11 (ref 5–15)
BUN: 10 mg/dL (ref 6–20)
CO2: 24 mmol/L (ref 22–32)
Calcium: 9.5 mg/dL (ref 8.9–10.3)
Chloride: 102 mmol/L (ref 98–111)
Creatinine, Ser: 0.81 mg/dL (ref 0.44–1.00)
GFR, Estimated: >60 mL/min (ref >60)
Glucose, Bld: 97 mg/dL (ref 70–99)
Potassium: 5.1 mmol/L (ref 3.5–5.1)
Sodium: 137 mmol/L (ref 135–145)
Total Bilirubin: 0.3 mg/dL (ref 0.3–1.2)
Total Protein: 8.1 g/dL (ref 6.5–8.1)

## 2021-02-25 LAB — PROTIME-INR
INR: 2.8 — ABNORMAL HIGH (ref 0.8–1.2)
Prothrombin Time: 29.8 seconds — ABNORMAL HIGH (ref 11.4–15.2)

## 2021-02-25 LAB — RESP PANEL BY RT-PCR (FLU A&B, COVID) ARPGX2
Influenza A by PCR: NEGATIVE
Influenza B by PCR: NEGATIVE
SARS Coronavirus 2 by RT PCR: NEGATIVE

## 2021-02-25 MED ORDER — OXYCODONE-ACETAMINOPHEN 5-325 MG PO TABS
1.0000 | ORAL_TABLET | Freq: Once | ORAL | Status: AC
  Administered 2021-02-25: 1 via ORAL
  Filled 2021-02-25: qty 1

## 2021-02-25 NOTE — ED Triage Notes (Signed)
Pt complains of left leg pain and swelling since last night. She was treated for DVT 6 weeks ago. Her blood thinner was changed from eliquis to coumadin because eliquis was not working.

## 2021-02-25 NOTE — ED Provider Notes (Signed)
Emergency Medicine Provider Triage Evaluation Note  [facility] , a 55 y.o. female  was evaluated in triage.  Pt complains of left leg swelling and pain that began last night.  Treated for DVT 6 weeks ago, originally on Eliquis but was changed to Coumadin due to Eliquis not working adequately.  Review of Systems  Positive: Leg swelling Negative: Fever, trauma  Physical Exam  BP 111/81 (BP Location: Left Arm)   Pulse 84   Temp 98.7 F (37.1 C) (Oral)   Resp 16   SpO2 97%  Gen:   Awake, no distress   Resp:  Normal effort  MSK:   Moves extremities without difficulty  Other:    Medical Decision Making  Medically screening exam initiated at 2:35 PM.  Appropriate orders placed.  [place] was informed that the remainder of the evaluation will be completed by another provider, this initial triage assessment does not replace that evaluation, and the importance of remaining in the ED until their evaluation is complete.     Claude Manges, PA-C 02/25/21 1436    Derwood Kaplan, MD 02/25/21 1530

## 2021-02-25 NOTE — Discharge Instructions (Addendum)
Your INR was 2.8 today.  Continue taking your medications as prescribed.  Follow up with your vascular surgeon as scheduled.

## 2021-02-25 NOTE — Progress Notes (Signed)
Left lower extremity venous duplex has been completed. Preliminary results can be found in CV Proc through chart review.  Results were given to [facility] PA.  02/25/21 2:55 PM Olen Cordial RVT

## 2021-02-25 NOTE — ED Provider Notes (Signed)
[facility]-EMERGENCY DEPT Provider Note   CSN: [id number] Arrival date & time: 02/25/21  1319     History Chief Complaint  Patient presents with   Leg Pain    Kristina Owens is a 55 y.o. female.  The history is provided by the patient and medical records.  Leg Pain Kristina Owens is a 55 y.o. female who presents to the Emergency Department complaining of leg pain/swelling.  She presents to the ED for evaluation of LLE pain and swelling that started yesterday.  Pain is mostly in the left posterior calf and the left upper thigh.  Has more ankle swelling.    She has a hx/o DVT and recently had a thrombectomy 6 weeks ago and she was started on Eliquis at that time.  She developed a recurrent clot and was started on coumadin.  She continues the coumadin as prescribed.  She has associated sob - intermittent, unchanged since recent PE. No fever, chest pain, vomiting, diarrhea.  Has occasional blood in her stool.      Past Medical History:  Diagnosis Date   Anxiety    Depression    GERD (gastroesophageal reflux disease)    Ovarian cyst     Patient Active Problem List   Diagnosis Date Noted   Pulmonary embolism (HCC) 01/08/2021   DVT (deep venous thrombosis) (HCC) 01/08/2021   Polysubstance (excluding opioids) dependence (HCC) 01/08/2021   Alcohol abuse with withdrawal (HCC) 01/08/2021   Bipolar disorder, in full remission, most recent episode depressed (HCC) 01/18/2020   Generalized anxiety disorder 01/18/2020   Alcohol use disorder, severe, in early remission, dependence (HCC) 01/18/2020   Cocaine use disorder, severe, in early remission (HCC) 01/18/2020   Suicidal ideation    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     Family History  Problem Relation Age of Onset   Asthma Mother    COPD Mother    Cancer Father        thyroid cancer    Social History   Tobacco Use   Smoking status: Every Day    Packs/day: 0.50    Years: 35.00    Pack years: 17.50    Types: Cigarettes   Smokeless tobacco: Never  Vaping Use   Vaping Use: Never used  Substance Use Topics   Alcohol use: Yes   Drug use: Yes    Types: "Crack" cocaine, Cocaine, Methamphetamines    Comment: last used 4/31/22    Home Medications Prior to Admission medications   Medication Sig Start Date End Date Taking? Authorizing Provider  acetaminophen (TYLENOL) 500 MG tablet Take 1,000 mg by mouth every 4 (four) hours as needed (for pain- ALTERNATING WITH IBUPROFEN).    [provider]  apixaban (ELIQUIS) 5 MG TABS tablet Take 1 tablet (5 mg total) by mouth 2 (two) times daily. 01/18/21   Regalado, Belkys A, MD  ARIPiprazole (ABILIFY) 10 MG  tablet Take 1 tablet (10 mg total) by mouth daily. 01/19/21   Regalado, Belkys A, MD  busPIRone (BUSPAR) 10 MG tablet Take 1 tablet (10 mg total) by mouth 3 (three) times daily. Patient taking differently: Take 10 mg by mouth 3 (three) times daily as needed (for anxiety). 01/18/21   Regalado, Belkys A, MD  HYDROcodone-acetaminophen (NORCO/VICODIN) 5-325 MG tablet Take 1 tablet by mouth every 8 (eight) hours as needed for severe pain. 02/06/21   Harlene Salts A, PA-C  ibuprofen (ADVIL) 200 MG tablet Take 800 mg by mouth every 4 (four) hours as needed (for pain- ALTERNATING WITH ACETAMINOPHEN).    [provider]  lamoTRIgine (LAMICTAL) 25 MG tablet Take 1 tablet (25 mg total) by mouth daily. 01/19/21    Regalado, Belkys A, MD  ondansetron (ZOFRAN ODT) 4 MG disintegrating tablet Take 1 tablet (4 mg total) by mouth every 8 (eight) hours as needed for nausea or vomiting. Patient not taking: Reported on 02/07/2021 02/06/21   Harlene Salts A, PA-C  sertraline (ZOLOFT) 50 MG tablet Take 1 tablet (50 mg total) by mouth daily. 01/18/21   Regalado, Prentiss Bells, MD    Allergies    Patient has no known allergies.  Review of Systems   Review of Systems  All other systems reviewed and are negative.  Physical Exam Updated Vital Signs BP 110/83   Pulse 86   Temp 98.5 F (36.9 C) (Oral)   Resp 16   SpO2 99%   Physical Exam Vitals and nursing note reviewed.  Constitutional:      Appearance: She is well-developed.  HENT:     Head: Normocephalic and atraumatic.  Cardiovascular:     Rate and Rhythm: Normal rate and regular rhythm.     Heart sounds: No murmur heard. Pulmonary:     Effort: Pulmonary effort is normal. No respiratory distress.  Musculoskeletal:     Comments: 2+ left DP pulses.  No significant lower extremity edema.  TTP over left calf.    Skin:    General: Skin is warm and dry.  Neurological:     Mental Status: She is alert and oriented to person, place, and time.  Psychiatric:        Behavior: Behavior normal.    ED Results / Procedures / Treatments   Labs (all labs ordered are listed, but only abnormal results are displayed) Labs Reviewed  COMPREHENSIVE METABOLIC PANEL - Abnormal; Notable for the following components:      Result Value   AST 47 (*)    All other components within normal limits  PROTIME-INR - Abnormal; Notable for the following components:   Prothrombin Time 29.8 (*)    INR 2.8 (*)    All other components within normal limits  RESP PANEL BY RT-PCR (FLU A&B, COVID) ARPGX2  CBC WITH DIFFERENTIAL/PLATELET    EKG None  Radiology VAS [place] LOWER EXTREMITY VENOUS (DVT) (ONLY [facility] & [facility] 7a-7p)  Result Date: 02/25/2021  Lower Venous DVT Study Patient Name:   Kristina Owens  Date of Exam:   02/25/2021 Medical Rec #: [id number]          Accession #:    [id number] Date of Birth: 03/25/1966         Patient Gender: F Patient Age:   54Y Exam Location:  [facility] Procedure:      VAS [place] LOWER EXTREMITY VENOUS (DVT) Referring Phys: [id number] Leonie Douglas SOTO --------------------------------------------------------------------------------  Indications: Swelling.  Risk Factors: DVT. Anticoagulation: Coumadin. Comparison Study: 01/08/2021 -  Left lower extremity venous duplex -                   RIGHT:                   - There is no evidence of deep vein thrombosis in the lower                   extremity.                    LEFT:                   - Findings consistent with acute deep vein thrombosis                   involving the left common femoral vein, left femoral vein,                   left popliteal vein, left posterior tibial veins, left                   peroneal veins, left soleal veins, and left                   gastrocnemius veins.                   - No cystic structure found in the popliteal fossa.                    01/29/2021 - Left lower extremity venous duplex -                   RIGHT:                   - No evidence of common femoral vein obstruction.                    LEFT:                   - Findings consistent with acute deep vein thrombosis                   involving the SF junction, and left popliteal vein.                   - Findings consistent with acute superficial vein thrombosis                   involving the left great saphenous vein.                   - Findings consistent with age indeterminate deep vein                   thrombosis                   involving the left proximal profunda vein.                   - Findings consistent with chronic deep vein thrombosis                   involving the left common femoral vein, left posterior tibial                   veins, left peroneal veins,  left soleal veins, and left  gastrocnemius veins.                   - No cystic structure found in the popliteal fossa.                    02/05/2021 - Left lower extremity venous duplex -                   RIGHT:                   - No evidence of common femoral vein obstruction.                    LEFT:                   - Findings consistent with acute deep vein thrombosis                   involving the left popliteal vein, and left soleal veins.                   - Findings consistent with age indeterminate deep vein                   thrombosis                   involving the left posterior tibial veins, and left peroneal                   veins.                   - Findings consistent with chronic deep vein thrombosis                   involving the left common femoral vein, SF junction, and left                   proximal profunda vein.                   - No cystic structure found in the popliteal fossa. Performing Technologist: Chanda BusingGregory Collins RVT  Examination Guidelines: A complete evaluation includes B-mode imaging, spectral Doppler, color Doppler, and power Doppler as needed of all accessible portions of each vessel. Bilateral testing is considered an integral part of a complete examination. Limited examinations for reoccurring indications may be performed as noted. The reflux portion of the exam is performed with the patient in reverse Trendelenburg.  +-----+---------------+---------+-----------+----------+--------------+ RIGHTCompressibilityPhasicitySpontaneityPropertiesThrombus Aging +-----+---------------+---------+-----------+----------+--------------+ CFV  Full           Yes      Yes                                 +-----+---------------+---------+-----------+----------+--------------+   +---------+---------------+---------+-----------+----------+-----------------+ LEFT     CompressibilityPhasicitySpontaneityPropertiesThrombus Aging     +---------+---------------+---------+-----------+----------+-----------------+ CFV      Full           Yes      Yes                                    +---------+---------------+---------+-----------+----------+-----------------+ SFJ      Full                                                           +---------+---------------+---------+-----------+----------+-----------------+  FV Prox  Full                                                           +---------+---------------+---------+-----------+----------+-----------------+ FV Mid   Full                                                           +---------+---------------+---------+-----------+----------+-----------------+ FV DistalFull                                                           +---------+---------------+---------+-----------+----------+-----------------+ PFV      Full                                                           +---------+---------------+---------+-----------+----------+-----------------+ POP      Partial        No       No                   Age Indeterminate +---------+---------------+---------+-----------+----------+-----------------+ PTV      Partial                                      Age Indeterminate +---------+---------------+---------+-----------+----------+-----------------+ PERO     Full                                                           +---------+---------------+---------+-----------+----------+-----------------+ Soleal   Full                                                           +---------+---------------+---------+-----------+----------+-----------------+ Gastroc  Full                                                           +---------+---------------+---------+-----------+----------+-----------------+     Summary: RIGHT: - No evidence of common femoral vein obstruction.  LEFT: - Findings consistent with age indeterminate deep vein  thrombosis involving the left popliteal vein, and left posterior tibial veins. - No cystic structure found in the popliteal fossa.  *See table(s) above for measurements and observations. Electronically signed by Fabienne Bruns MD on 02/25/2021 at 4:30:40 PM.  Final     Procedures Procedures   Medications Ordered in ED Medications  oxyCODONE-acetaminophen (PERCOCET/ROXICET) 5-325 MG per tablet 1 tablet (has no administration in time range)  oxyCODONE-acetaminophen (PERCOCET/ROXICET) 5-325 MG per tablet 1 tablet (1 tablet Oral Given 02/25/21 1923)    ED Course  I have reviewed the triage vital signs and the nursing notes.  Pertinent labs & imaging results that were available during my care of the patient were reviewed by me and considered in my medical decision making (see chart for details).    MDM Rules/Calculators/A&P                          patient with history of PE, recently treated on eloquence with treatment failure and recurrent DVT. She has been transition to warfarin. She is here for increasing swelling and discomfort to the left leg. Ultrasound with indeterminate DVT. Her INR is therapeutic. Discussed with hematology. This is felt to be appropriate given her recent clot and is not to be considered an acute DVT or treatment failure. Discussed with patient home care for DVT with outpatient vascular surgery follow-up.  Final Clinical Impression(s) / ED Diagnoses Final diagnoses:  Left leg pain    Rx / DC Orders ED Discharge Orders     None        Tilden Fossa, MD 02/25/21 2211

## 2021-02-25 NOTE — ED Notes (Signed)
Patient decided to leave.   

## 2021-02-26 ENCOUNTER — Telehealth: Payer: Self-pay

## 2021-02-28 ENCOUNTER — Telehealth (INDEPENDENT_AMBULATORY_CARE_PROVIDER_SITE_OTHER): Payer: Medicaid Other | Admitting: Nurse Practitioner

## 2021-02-28 ENCOUNTER — Encounter: Payer: Self-pay | Admitting: Nurse Practitioner

## 2021-02-28 ENCOUNTER — Other Ambulatory Visit: Payer: Self-pay

## 2021-02-28 DIAGNOSIS — Z5181 Encounter for therapeutic drug level monitoring: Secondary | ICD-10-CM

## 2021-02-28 DIAGNOSIS — K921 Melena: Secondary | ICD-10-CM

## 2021-02-28 DIAGNOSIS — Z7901 Long term (current) use of anticoagulants: Secondary | ICD-10-CM

## 2021-02-28 NOTE — Progress Notes (Signed)
   [facility] Patient [facility] [address] Kristina Owens [place], [place]  [address] Phone:  [phone]   Fax:  [phone] Virtual Visit via Telephone Note  I connected with Kristina Owens on 02/28/21 at  3:20 PM EDT by telephone and verified that I am speaking with the correct person using two identifiers.   I discussed the limitations, risks, security and privacy concerns of performing an evaluation and management service by telephone and the availability of in person appointments. I also discussed with the patient that there may be a patient responsible charge related to this service. The patient expressed understanding and agreed to proceed.  Patient home Provider Office  History of Present Illness:  Kristina Owens  has a past medical history of Anxiety, Depression, GERD (gastroesophageal reflux disease), and Ovarian cyst.   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  She reports that the Eliquis therapy was not effective. She cannot to develop new clots. She has been at the Coumadin.  She was recently seen at the Coumadin clinic.  She was advised to contact our office for evaluation  Patient has noticed a change in the color of her stool . Patient has associated symptoms of  color change in the stool; maroon   . The patient has occasional abdominal pain; LLQ and nausea.  She denies any other symptoms.  The patient has a known history of: colon polyps and anticoagulant therapy . The patient has noticed the change over the last 10 or 11 days ago.  This is after the start of Clomid therapy.  There is not a history of rectal injury. Patient has not had similar episodes of rectal bleeding in the past. She reports a colonoscopy 5 years ago . She did have polyps removed.  Her current INR 5.1.  She will follow-up with Coumadin clinic on tomorrow    ROS   Observations/Objective: No exam; telephone visit  Assessment  and Plan:  Problem List Items Addressed This Visit   None Visit Diagnoses     Anticoagulant long-term use    Eliquis discontinued currently on Coumadin therapy   Encounter for monitoring Coumadin therapy    2 weeks of Coumadin therapy being followed in [place]   Blood in stool  -  Primary Referral to gastroenterology due to history of colon polyps and possible occult blood in stool   Relevant Orders   Ambulatory referral to Gastroenterology        No orders of the defined types were placed in this encounter.    Barbette Merino, NP   Follow Up Instructions: Follow-up as scheduled   I discussed the assessment and treatment plan with the patient. The patient was provided an opportunity to ask questions and all were answered. The patient agreed with the plan and demonstrated an understanding of the instructions.   The patient was advised to call back or seek an in-person evaluation if the symptoms worsen or if the condition fails to improve as anticipated.  I provided 8 minutes of telephone- visit time during this encounter.   Barbette Merino, NP

## 2021-02-28 NOTE — Patient Instructions (Signed)
Rectal Bleeding Rectal bleeding is when blood comes out of the opening of the butt (anus). People with this kind of bleeding may notice bright red blood in their underwear or in the toilet after they poop (have a bowel movement). They may also have blood mixed with their poop (stool), or dark red or black poop. Rectal bleeding is often a sign that something is wrong. This condition can be caused by many things. It needs to be checked by a doctor. Your doctor will do tests to know what is causing your condition. Follow these instructions at home: Watch for any changes in your condition. Take these actions to help with bleeding and discomfort: Medicines Take over-the-counter and prescription medicines only as told by your doctor. Ask your doctor about changing or stopping your normal medicines. This is important if you are taking blood thinners. Medicines that thin the blood can make rectal bleeding worse. Managing constipation Your condition may cause trouble pooping (constipation). To prevent or treat trouble pooping, or to help make your poop soft, you may need to: Drink enough fluid to keep your pee (urine) pale yellow. Take over-the-counter or prescription medicines. Eat foods that are high in fiber. These include beans, whole grains, and fresh fruits and vegetables. Limit foods that are high in fat and sugar. These include fried or sweet foods.  General instructions Try not to strain when you poop. Try taking a warm bath. This may help with pain. Keep all follow-up visits as told by your doctor. This is important. Contact a doctor if: You have pain or swelling in your belly (abdomen). You have a fever. You feel weak. You feel like you may vomit. You cannot poop. Get help right away if: You have new bleeding. You have more bleeding than before. You have black or dark red poop. You vomit blood or something that looks like coffee grounds. You pass out (faint). You have very bad pain  in your butt. Summary Rectal bleeding is when blood comes out of the opening of the butt. This bleeding is often a sign that something is wrong. Eat a diet that is high in fiber. This will help to keep your poop soft. Talk to your doctor if you take medicines that thin the blood. These medicines can make bleeding worse. Get help right away if you have new or more bleeding, black or dark red poop, or blood in your vomit. Also, get help if you pass out or have very bad pain in your butt. This information is not intended to replace advice given to you by your health care provider. Make sure you discuss any questions you have with your health care provider. Document Revised: 06/22/2019 Document Reviewed: 06/22/2019 Elsevier Patient Education  2022 Elsevier Inc.  

## 2021-03-06 ENCOUNTER — Other Ambulatory Visit: Payer: Self-pay

## 2021-03-06 ENCOUNTER — Emergency Department (HOSPITAL_COMMUNITY)
Admission: EM | Admit: 2021-03-06 | Discharge: 2021-03-07 | Disposition: A | Discharge status: Home / Self Care | Payer: Self-pay | Attending: Emergency Medicine | Admitting: Emergency Medicine

## 2021-03-06 ENCOUNTER — Emergency Department (HOSPITAL_COMMUNITY): Payer: Self-pay

## 2021-03-06 ENCOUNTER — Encounter (HOSPITAL_COMMUNITY): Payer: Self-pay

## 2021-03-06 DIAGNOSIS — N898 Other specified noninflammatory disorders of vagina: Secondary | ICD-10-CM | POA: Insufficient documentation

## 2021-03-06 DIAGNOSIS — R0602 Shortness of breath: Secondary | ICD-10-CM | POA: Insufficient documentation

## 2021-03-06 DIAGNOSIS — I82402 Acute embolism and thrombosis of unspecified deep veins of left lower extremity: Secondary | ICD-10-CM | POA: Insufficient documentation

## 2021-03-06 DIAGNOSIS — I824Z2 Acute embolism and thrombosis of unspecified deep veins of left distal lower extremity: Secondary | ICD-10-CM

## 2021-03-06 DIAGNOSIS — F17210 Nicotine dependence, cigarettes, uncomplicated: Secondary | ICD-10-CM | POA: Insufficient documentation

## 2021-03-06 DIAGNOSIS — Z7901 Long term (current) use of anticoagulants: Secondary | ICD-10-CM | POA: Insufficient documentation

## 2021-03-06 DIAGNOSIS — M7989 Other specified soft tissue disorders: Secondary | ICD-10-CM

## 2021-03-06 HISTORY — DX: Deep phlebothrombosis in pregnancy, unspecified trimester: O22.30

## 2021-03-06 LAB — I-STAT BETA HCG BLOOD, ED (MC, WL, AP ONLY): I-stat hCG, quantitative: <5 m[IU]/mL (ref <5)

## 2021-03-06 NOTE — ED Provider Notes (Signed)
Emergency Medicine Provider Triage Evaluation Note  [facility] , a 55 y.o. female  was evaluated in triage.  Pt complains of leg swelling.  Patient reports swelling and pain in her left leg that began today.  She reports associated shortness of breath that also began today.  No chest pain, cough, fever, chills, redness, rash.  She is on Coumadin and reports that her INR has been supratherapeutic.  She has a history of PE.  Review of Systems  Positive: Shortness of breath, leg pain, leg swelling Negative: Fever, chills, cough, rash, erythema, chest pain, abdominal pain, vomiting  Physical Exam  BP 114/75   Pulse 69   Temp 98.2 F (36.8 C)   Resp (!) 21   SpO2 99%  Gen:   Awake, no distress   Resp:  Normal effort  MSK:   Moves extremities without difficulty  Other:  Trace peripheral edema to the bilateral lower extremities.  Lungs are clear to auscultation bilaterally.  No tachypnea.  Medical Decision Making  Medically screening exam initiated at 11:51 PM.  Appropriate orders placed.  Fotini [place] was informed that the remainder of the evaluation will be completed by another provider, this initial triage assessment does not replace that evaluation, and the importance of remaining in the ED until their evaluation is complete.  Work-up has been initiated in the emergency department.  She will require further work-up and evaluation.  I reviewed the patient's INR from 8/1 through care everywhere and it was therapeutic.  Will review today.   Barkley Boards, PA-C 03/06/21 2351    Glynn Octave, MD 03/07/21 [phone]

## 2021-03-06 NOTE — ED Triage Notes (Signed)
Pt reports that she has a hx of DVT and PE, reports today began to have swelling of pain and swelling in her L leg. Some SOB

## 2021-03-07 ENCOUNTER — Emergency Department (HOSPITAL_BASED_OUTPATIENT_CLINIC_OR_DEPARTMENT_OTHER): Payer: Self-pay

## 2021-03-07 ENCOUNTER — Telehealth: Payer: Self-pay | Admitting: Nurse Practitioner

## 2021-03-07 ENCOUNTER — Emergency Department (HOSPITAL_COMMUNITY): Payer: Self-pay

## 2021-03-07 DIAGNOSIS — M7989 Other specified soft tissue disorders: Secondary | ICD-10-CM

## 2021-03-07 DIAGNOSIS — M79605 Pain in left leg: Secondary | ICD-10-CM

## 2021-03-07 LAB — CBC WITH DIFFERENTIAL/PLATELET
Abs Immature Granulocytes: 0.02 10*3/uL (ref 0.00–0.07)
Basophils Absolute: 0 10*3/uL (ref 0.0–0.1)
Basophils Relative: 0 %
Eosinophils Absolute: 0.2 10*3/uL (ref 0.0–0.5)
Eosinophils Relative: 3 %
HCT: 39.7 % (ref 36.0–46.0)
Hemoglobin: 13.1 g/dL (ref 12.0–15.0)
Immature Granulocytes: 0 %
Lymphocytes Relative: 53 %
Lymphs Abs: 4.1 10*3/uL — ABNORMAL HIGH (ref 0.7–4.0)
MCH: 29.1 pg (ref 26.0–34.0)
MCHC: 33 g/dL (ref 30.0–36.0)
MCV: 88.2 fL (ref 80.0–100.0)
Monocytes Absolute: 0.7 10*3/uL (ref 0.1–1.0)
Monocytes Relative: 9 %
Neutro Abs: 2.7 10*3/uL (ref 1.7–7.7)
Neutrophils Relative %: 35 %
Platelets: 314 10*3/uL (ref 150–400)
RBC: 4.5 MIL/uL (ref 3.87–5.11)
RDW: 13.7 % (ref 11.5–15.5)
WBC: 7.7 10*3/uL (ref 4.0–10.5)
nRBC: 0 % (ref 0.0–0.2)

## 2021-03-07 LAB — COMPREHENSIVE METABOLIC PANEL
ALT: 20 U/L (ref 0–44)
AST: 16 U/L (ref 15–41)
Albumin: 4 g/dL (ref 3.5–5.0)
Alkaline Phosphatase: 84 U/L (ref 38–126)
Anion gap: 10 (ref 5–15)
BUN: 10 mg/dL (ref 6–20)
CO2: 22 mmol/L (ref 22–32)
Calcium: 9.4 mg/dL (ref 8.9–10.3)
Chloride: 104 mmol/L (ref 98–111)
Creatinine, Ser: 0.64 mg/dL (ref 0.44–1.00)
GFR, Estimated: >60 mL/min (ref >60)
Glucose, Bld: 112 mg/dL — ABNORMAL HIGH (ref 70–99)
Potassium: 3.9 mmol/L (ref 3.5–5.1)
Sodium: 136 mmol/L (ref 135–145)
Total Bilirubin: 0.7 mg/dL (ref 0.3–1.2)
Total Protein: 6.7 g/dL (ref 6.5–8.1)

## 2021-03-07 LAB — PROTIME-INR
INR: 1.6 — ABNORMAL HIGH (ref 0.8–1.2)
Prothrombin Time: 19.1 seconds — ABNORMAL HIGH (ref 11.4–15.2)

## 2021-03-07 LAB — TROPONIN I (HIGH SENSITIVITY)
Troponin I (High Sensitivity): 3 ng/L (ref <18)
Troponin I (High Sensitivity): 3 ng/L (ref <18)

## 2021-03-07 LAB — BRAIN NATRIURETIC PEPTIDE: B Natriuretic Peptide: 16.9 pg/mL (ref 0.0–100.0)

## 2021-03-07 MED ORDER — ENOXAPARIN SODIUM 80 MG/0.8ML IJ SOSY
1.0000 mg/kg | PREFILLED_SYRINGE | Freq: Once | INTRAMUSCULAR | Status: DC
  Filled 2021-03-07: qty 0.65

## 2021-03-07 MED ORDER — OXYCODONE-ACETAMINOPHEN 5-325 MG PO TABS
1.0000 | ORAL_TABLET | Freq: Once | ORAL | Status: AC
  Administered 2021-03-07: 1 via ORAL
  Filled 2021-03-07: qty 1

## 2021-03-07 MED ORDER — OXYCODONE-ACETAMINOPHEN 5-325 MG PO TABS
2.0000 | ORAL_TABLET | Freq: Once | ORAL | Status: AC
  Administered 2021-03-07: 2 via ORAL
  Filled 2021-03-07: qty 2

## 2021-03-07 MED ORDER — ACETAMINOPHEN 325 MG PO TABS
650.0000 mg | ORAL_TABLET | Freq: Once | ORAL | Status: AC
  Administered 2021-03-07: 650 mg via ORAL
  Filled 2021-03-07: qty 2

## 2021-03-07 MED ORDER — ENOXAPARIN SODIUM 80 MG/0.8ML IJ SOSY
1.0000 mg/kg | PREFILLED_SYRINGE | Freq: Two times a day (BID) | INTRAMUSCULAR | Status: DC
  Administered 2021-03-07: 67.5 mg via SUBCUTANEOUS
  Filled 2021-03-07: qty 0.68

## 2021-03-07 MED ORDER — IOHEXOL 350 MG/ML SOLN
61.0000 mL | Freq: Once | INTRAVENOUS | Status: AC | PRN
  Administered 2021-03-07: 61 mL via INTRAVENOUS

## 2021-03-07 NOTE — Discharge Instructions (Addendum)
Your INR today is low at 1.6. Lovenox injection given in ED. Followup with the coumadin clinic today for dosage adjustments as scheduled. Return to the ED if you develop new or worsening symptoms.

## 2021-03-07 NOTE — ED Notes (Signed)
Patient given discharge instructions. Questions were answered. Patient verbalized understanding of discharge instructions and care at home.  

## 2021-03-07 NOTE — ED Provider Notes (Signed)
[facility]   CSN: [id number] Arrival date & time: 03/06/21  2206     History Chief Complaint  Patient presents with   Leg Swelling    Kristina LanJennifer Owens is a 55 y.o. female.  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  States pain started acutely in her left thigh and lower leg last night.  No fall or trauma.  Increase shortness of breath today with some intermittent chest tightness, ongoing every few minutes.  Estimates the tightness lasted about 10 minutes at a time.  No cough, fever, rash.  States compliance with Coumadin.  She was transitioned off of Eliquis due to treatment failure.  States her INR was elevated 2 days ago and had 1 dose held per instructions. No weakness in the leg.  No numbness or tingling.  No abdominal pain, nausea or vomiting.  The history is provided by the patient.      Past Medical History:  Diagnosis Date   Anxiety    Depression    DVT (deep vein thrombosis) in pregnancy    GERD (gastroesophageal reflux disease)    Ovarian cyst     Patient Active Problem List   Diagnosis Date Noted   Pulmonary embolism (HCC) 01/08/2021   DVT (deep venous thrombosis) (HCC) 01/08/2021   Polysubstance (excluding opioids) dependence (HCC) 01/08/2021   Alcohol abuse with withdrawal (HCC) 01/08/2021   Bipolar disorder, in full remission, most recent episode depressed (HCC) 01/18/2020   Generalized anxiety disorder 01/18/2020   Alcohol use disorder, severe, in early remission, dependence (HCC) 01/18/2020   Cocaine use disorder, severe, in early remission (HCC) 01/18/2020   Suicidal ideation    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     Family History  Problem Relation Age of Onset   Asthma Mother    COPD Mother    Cancer Father        thyroid cancer    Social History   Tobacco Use   Smoking status: Every Day    Packs/day: 0.50    Years: 35.00    Pack years: 17.50    Types: Cigarettes   Smokeless tobacco: Never  Vaping Use   Vaping Use: Never used  Substance Use Topics   Alcohol use: Yes   Drug use: Yes    Types: "Crack" cocaine, Cocaine, Methamphetamines    Comment: last used 4/31/22    Home Medications Prior to Admission medications   Medication Sig Start Date End Date Taking? Authorizing Provider  acetaminophen (TYLENOL) 500 MG tablet Take 1,000 mg by mouth every 4 (four) hours as needed (for pain- ALTERNATING WITH IBUPROFEN).   Yes [provider]  ARIPiprazole (ABILIFY) 10 MG tablet Take 1 tablet (10 mg total) by mouth daily. 01/19/21  Yes Regalado, Prentiss BellsBelkys A, MD  Artificial Tear Ointment (DRY EYES  OP) Place 1 drop into both eyes daily as needed (red/dry eyes).   Yes [provider]  busPIRone (BUSPAR) 10 MG tablet Take 1 tablet (10 mg total) by mouth 3 (three) times daily. Patient taking differently: Take 10 mg by mouth 3 (three) times daily as needed (for anxiety). 01/18/21  Yes Regalado, Belkys A, MD  HYDROcodone-acetaminophen (NORCO/VICODIN) 5-325 MG tablet Take 1 tablet by mouth every 6 (six) hours as needed for moderate pain.   Yes [provider]  lamoTRIgine (LAMICTAL) 25 MG tablet Take 1 tablet (25 mg total) by mouth daily. 01/19/21  Yes Regalado, Belkys A, MD  oxyCODONE-acetaminophen (PERCOCET) 5-325 MG tablet Take 1 tablet by mouth every 4 (four) hours as needed for  severe pain.   Yes [provider]  sertraline (ZOLOFT) 50 MG tablet Take 1 tablet (50 mg total) by mouth daily. 01/18/21  Yes Regalado, Belkys A, MD  warfarin (COUMADIN) 4 MG tablet Take 4 mg by mouth daily. 02/14/21  Yes [provider]  cefdinir (OMNICEF) 300 MG capsule Take 300 mg by mouth See admin instructions. Bid x 5 days Patient not taking: Reported on 03/07/2021    [provider]    Allergies    Patient has no known allergies.  Review of Systems   Review of Systems  Constitutional:  Negative for activity change, appetite change and fever.  HENT:  Negative for congestion.   Respiratory:  Positive for chest tightness and shortness of breath. Negative for cough.   Cardiovascular:  Positive for leg swelling.  Gastrointestinal:  Negative for abdominal pain, nausea and vomiting.  Genitourinary:  Negative for dysuria.  Musculoskeletal:  Positive for arthralgias and myalgias.  Skin:  Negative for rash.  Neurological:  Negative for dizziness, weakness and headaches.   all other systems are negative except as noted in the HPI and PMH.   Physical Exam Updated Vital Signs BP 109/75 (BP Location: Left Arm)   Pulse 81   Temp 98.2 F (36.8 C)   Resp 18   SpO2 98%   Physical Exam Vitals and nursing note reviewed.  Constitutional:      General: She is not in acute distress.    Appearance: She is well-developed.  HENT:     Head: Normocephalic and atraumatic.     Mouth/Throat:     Pharynx: No oropharyngeal exudate.  Eyes:     Conjunctiva/sclera: Conjunctivae normal.     Pupils: Pupils are equal, round, and reactive to light.  Neck:     Comments: No meningismus. Cardiovascular:     Rate and Rhythm: Normal rate and regular rhythm.     Heart sounds: Normal heart sounds. No murmur heard. Pulmonary:     Effort: Pulmonary effort is normal. No respiratory distress.     Breath sounds: Normal breath sounds.  Abdominal:     Palpations: Abdomen is soft.      Tenderness: There is no abdominal tenderness. There is no guarding or rebound.  Musculoskeletal:        General: Tenderness present. Normal range of motion.     Cervical back: Normal range of motion and neck supple.     Comments: Left calf tenderness, no asymmetry.  Intact PT and DP pulse.  Skin:    General: Skin is warm.  Neurological:     Mental Status: She is alert and oriented to person, place, and time.     Cranial Nerves: No cranial nerve deficit.     Motor: No abnormal muscle tone.  Coordination: Coordination normal.     Comments:  5/5 strength throughout. CN 2-12 intact.Equal grip strength.   Psychiatric:        Behavior: Behavior normal.    ED Results / Procedures / Treatments   Labs (all labs ordered are listed, but only abnormal results are displayed) Labs Reviewed  COMPREHENSIVE METABOLIC PANEL - Abnormal; Notable for the following components:      Result Value   Glucose, Bld 112 (*)    All other components within normal limits  CBC WITH DIFFERENTIAL/PLATELET - Abnormal; Notable for the following components:   Lymphs Abs 4.1 (*)    All other components within normal limits  PROTIME-INR - Abnormal; Notable for the following components:   Prothrombin Time 19.1 (*)    INR 1.6 (*)    All other components within normal limits  BRAIN NATRIURETIC PEPTIDE  I-STAT BETA HCG BLOOD, ED ([facility], [facility], [facility] ONLY)  TROPONIN I (HIGH SENSITIVITY)  TROPONIN I (HIGH SENSITIVITY)    EKG None  Radiology DG Chest 2 View  Result Date: 03/06/2021 CLINICAL DATA:  55 year old female with shortness of breath. EXAM: CHEST - 2 VIEW COMPARISON:  Chest radiograph dated 02/05/2021. FINDINGS: The heart size and mediastinal contours are within normal limits. Both lungs are clear. The visualized skeletal structures are unremarkable. IMPRESSION: No active cardiopulmonary disease. Electronically Signed   By: Elgie Collard M.D.   On: 03/06/2021 23:46    Procedures Procedures   Medications Ordered  in ED Medications  oxyCODONE-acetaminophen (PERCOCET/ROXICET) 5-325 MG per tablet 2 tablet (has no administration in time range)  enoxaparin (LOVENOX) injection 65 mg (has no administration in time range)  acetaminophen (TYLENOL) tablet 650 mg (650 mg Oral Given 03/07/21 0152)    ED Course  I have reviewed the triage vital signs and the nursing notes.  Pertinent labs & imaging results that were available during my care of the patient were reviewed by me and considered in my medical decision making (see chart for details).    MDM Rules/Calculators/A&P                          Patient with known DVT and PE here with left leg pain and swelling.  No hypoxia or increased work of breathing.  INR today is subtherapeutic at 1.6.  Doppler 7/25: LEFT:  - Findings consistent with age indeterminate deep vein thrombosis  involving the left popliteal vein, and left posterior tibial veins.  - No cystic structure found in the popliteal fossa.   CT angio chest was obtained in triage that shows regression of recent pulmonary emboli with only tiny web remaining left lower lobe.  No new clot. Troponin negative. Low suspicion for ACS.  Discussed with patient that as her INR is subtherapeutic lower extremity Doppler will not change her treatment plan. Treatment will still be Lovenox and Coumadin and follow-up with the Coumadin clinic. Patient insistent that she would like to wait for the ultrasound to ensure there is no new clot.  Care to be transferred at shift change. Dr. Jodi Mourning to assume care.  Lovenox ordered.   ED ECG REPORT   Date: 03/07/2021  Rate: 77  Rhythm: normal sinus rhythm  QRS Axis: normal  Intervals: normal  ST/T Wave abnormalities: normal  Conduction Disutrbances:none  Narrative Interpretation:   Old EKG Reviewed: unchanged  I have personally reviewed the EKG tracing and agree with the computerized printout as noted.     Final Clinical Impression(s) /  ED Diagnoses Final  diagnoses:  Left leg swelling    Rx / DC Orders ED Discharge Orders     None        Monai Hindes, Jeannett Senior, MD 03/07/21 [phone]

## 2021-03-07 NOTE — Progress Notes (Signed)
ANTICOAGULATION CONSULT NOTE - Initial Consult  Pharmacy Consult for enoxaparin Indication:  VTE treatment  No Known Allergies  Patient Measurements: Height: 5\' 4"  (162.6 cm) Weight: 67.6 kg (149 lb 0.5 oz) IBW/kg (Calculated) : 54.7 Heparin Dosing Weight: 67.6 kg   Vital Signs: Temp: 97.8 F (36.6 C) (08/04 0759) Temp Source: Oral (08/04 0759) BP: 100/67 (08/04 0759) Pulse Rate: 64 (08/04 0759)  Labs: Recent Labs    03/06/21 2325 03/07/21 0624  HGB 13.1  --   HCT 39.7  --   PLT 314  --   LABPROT 19.1*  --   INR 1.6*  --   CREATININE 0.64  --   TROPONINIHS  --  3    Estimated Creatinine Clearance: 76.9 mL/min (by C-G formula based on SCr of 0.64 mg/dL).   Medical History: Past Medical History:  Diagnosis Date   Anxiety    Depression    DVT (deep vein thrombosis) in pregnancy    GERD (gastroesophageal reflux disease)    Ovarian cyst     Medications:  Scheduled:   enoxaparin (LOVENOX) injection  1 mg/kg (Order-Specific) Subcutaneous Once    Assessment: 30 yof with hx DVT/PE on warfarin PTA - presenting with L leg pain and swelling. Was on apixaban in the past but changed d/t treatment failure.   INR on admission was subtherapeutic at 1.6. Hgb 13.1, plt 314. No s/sx of bleeding. CTA showing continued regression of recent PE with only a tiny web seen in LLL. Duplex ordered but not completed yet.   Goal of Therapy:  Anti-Xa level 0.6-1 units/ml 4hrs after LMWH dose given Monitor platelets by anticoagulation protocol: Yes   Plan:  Enoxaparin 67.5 mg (1 mg/kg) every 12 hours Monitor CBC, Scr trend, and for s/sx of bleeding   40, PharmD, BCCCP Clinical Pharmacist  Phone: [phone] 03/07/2021 8:51 AM  Please check AMION for all [facility] Pharmacy phone numbers After 10:00 PM, call Main Pharmacy [phone]

## 2021-03-07 NOTE — Telephone Encounter (Signed)
Nurse Practitioner Santina Evans wants to speak w/ PCP regarding mutual pt [facility].  Contact number [phone]. Thank you

## 2021-03-07 NOTE — ED Provider Notes (Signed)
Patient care signed out to follow-up ultrasound results.  Patient with significant blood clot history on Coumadin presents for mild shortness of breath and left thigh and leg pain last night.  Delayed ultrasound, results discussed with ultrasound department showing mild progression of 1 area of blood clot with other chronic clots.  Patient vitals are stable, well-appearing on assessment, requesting pain meds at this time.  Patient initially declined however then agreed for Lovenox and will follow closely in Coumadin clinic with her subtherapeutic INR.    Kenton Kingfisher, MD 03/07/21 1009

## 2021-03-07 NOTE — ED Notes (Signed)
Pt in ultrasound

## 2021-03-07 NOTE — Progress Notes (Signed)
Left lower extremity venous duplex completed. Refer to "CV Proc" under chart review to view preliminary results.  03/07/2021 9:21 AM Eula Fried., MHA, RVT, RDCS, RDMS

## 2021-03-12 ENCOUNTER — Ambulatory Visit: Payer: Self-pay | Admitting: Physician Assistant

## 2021-03-12 ENCOUNTER — Other Ambulatory Visit: Payer: Self-pay

## 2021-03-12 VITALS — BP 115/65 | HR 74 | Temp 98.5°F | Resp 16 | Ht 64.0 in | Wt 150.0 lb

## 2021-03-12 DIAGNOSIS — I2602 Saddle embolus of pulmonary artery with acute cor pulmonale: Secondary | ICD-10-CM

## 2021-03-12 DIAGNOSIS — I824Y2 Acute embolism and thrombosis of unspecified deep veins of left proximal lower extremity: Secondary | ICD-10-CM

## 2021-03-12 MED ORDER — HYDROCODONE-ACETAMINOPHEN 5-325 MG PO TABS
1.0000 | ORAL_TABLET | ORAL | 0 refills | Status: AC | PRN
Start: 2021-03-12 — End: 2021-03-17

## 2021-03-12 NOTE — Patient Instructions (Signed)
I started a referral for you to be seen by hematology, please let us know if you do not hear from them within the next week.  I sent a refill of your pain medication to your pharmacy.  Please let us know if there is anything else we can do for you.  Roney Jaffe, PA-C Physician Assistant [facility] Medicine [email or web address]   Deep Vein Thrombosis  Deep vein thrombosis (DVT) is a condition in which a blood clot forms in a deep vein, such as a vein in the lower leg, thigh, pelvis, or arm. Deep veins are veins in the deep venous system. A clot is blood that has thickened into a gel or solid. This condition is serious and can be life-threatening if the clot travels to the lungs and causes a blockage (pulmonary embolism) in the arteries of the lung. A DVT can also damage veins in the leg. This can lead to long-term, or chronic, venous disease, leg pain, swelling, discoloration, and ulcers or sores (post-thrombotic syndrome). What are the causes? This condition may be caused by: A slowdown of blood flow. Damage to a vein. A condition that causes blood to clot more easily, such as certain blood-clotting disorders. What increases the risk? The following factors may make you more likely to develop this condition: Having obesity. Being older, especially older than age 11. Being inactive (sedentary lifestyle) or not moving around. This may include: Sitting or lying down for longer than 4-6 hours other than to sleep at night. Being in the hospital, having major or lengthy surgery, or having a thin, flexible tube (central line catheter) placed in a large vein. Being pregnant, giving birth, or having recently given birth. Taking medicines that contain estrogen, such as birth control or hormone replacement therapy. Using products that contain nicotine or tobacco, especially if you use hormonal birth control. Having a history of blood clots or a  blood-clotting disease, a blood vessel disease (peripheral vascular disease), or congestive heart disease. Having a history of cancer, especially if being treated with chemotherapy. What are the signs or symptoms? Symptoms of this condition include: Swelling, pain, pressure, or tenderness in an arm or a leg. An arm or a leg becoming warm, red, or discolored. A leg turning very pale. You may have a large DVT. This is rare. If the clot is in your leg, you may notice symptoms more or have worse symptomswhen you stand or walk. In some cases, there are no symptoms. How is this diagnosed? This condition is diagnosed with: Your medical history and a physical exam. Tests, such as: Blood tests to check how well your blood clots. Doppler ultrasound. This is the best way to find a DVT. Venogram. Contrast dye is injected into a vein, and X-rays are taken to check for clots. How is this treated? Treatment for this condition depends on: The cause of your DVT. The size and location of your DVT, or having more than one DVT. Your risk for bleeding or developing more clots. Other medical conditions you may have. Treatment may include: Taking a blood thinner, also called an anticoagulant, to prevent clots from forming and growing. Wearing compression stockings, if directed. Injecting medicines into the affected vein to break up the clot (catheter-directed thrombolysis). This is used only for severe DVT and only if a specialist recommends it. Specific surgical procedures, when DVT is severe or hard to treat. These may be done to: Isolate and remove your clot. Place an inferior vena cava (IVC) filter  in a large vein to catch blood clots before they reach your lungs. You may get some medical treatments for 6 months or longer. Follow these instructions at home: If you are taking blood thinners: Talk with your health care provider before you take any medicines that contain aspirin or NSAIDs, such as  ibuprofen. These medicines increase your risk for dangerous bleeding. Take your medicine exactly as told, at the same time every day. Do not skip a dose. Do not take more than the prescribed dose. This is important. Ask your health care provider about foods and medicines that could change the way your blood thinner works (may interact). Avoid these foods and medicines if you are told to do so. Avoid anything that may cause bleeding or bruising. You may bleed more easily while taking blood thinners. Be very careful when using knives, scissors, or other sharp objects. Use an electric razor instead of a blade. Avoid activities that could cause injury or bruising, and follow instructions for preventing falls. Tell your health care provider if you have had any internal bleeding, bleeding ulcers, or neurologic diseases, such as strokes or cerebral aneurysms. Wear a medical alert bracelet or carry a card that lists what medicines you take. General instructions Take over-the-counter and prescription medicines only as told by your health care provider. Return to your normal activities as told by your health care provider. Ask your health care provider what activities are safe for you. If recommended, wear compression stockings as told by your health care provider. These stockings help to prevent blood clots and reduce swelling in your legs. Keep all follow-up visits as told by your health care provider. This is important. Contact a health care provider if: You miss a dose of your blood thinner. You have new or worse pain, swelling, or redness in an arm or a leg. You have worsening numbness or tingling in an arm or a leg. You have unusual bruising. Get help right away if: You have signs or symptoms that a blood clot has moved to the lungs. These may include: Shortness of breath. Chest pain. Fast or irregular heartbeats (palpitations). Light-headedness or dizziness. Coughing up blood. You have signs  or symptoms that your blood is too thin. These may include: Blood in your vomit, stool, or urine. A cut that will not stop bleeding. A menstrual period that is heavier than usual. A severe headache or confusion. These symptoms may represent a serious problem that is an emergency. Do not wait to see if the symptoms will go away. Get medical help right away. Call your local emergency services (911 in the [place] Do not drive yourself to the hospital. Summary Deep vein thrombosis (DVT) happens when a blood clot forms in a deep vein. This may occur in the lower leg, thigh, pelvis, or arm. Symptoms affect the arm or leg and can include swelling, pain, tenderness, warmth, redness, or discoloration. This condition may be treated with medicines or compression stockings. In severe cases, surgery may be done. If you are taking blood thinners, take them exactly as told. Do not skip a dose. Do not take more than is prescribed. Get help right away if you have shortness of breath, chest pain, fast or irregular heartbeats, or blood in your vomit, urine, or stool. This information is not intended to replace advice given to you by your health care provider. Make sure you discuss any questions you have with your healthcare provider. Document Revised: 07/16/2019 Document Reviewed: 07/16/2019 Elsevier Patient Education  2022 [employer]  

## 2021-03-12 NOTE — Progress Notes (Signed)
Established Patient Office Visit  Subjective:  Patient ID: Kristina Owens, female    DOB: 05/13/1966  Age: 55 y.o. MRN: [id number]  CC:  Chief Complaint  Patient presents with   left leg pain     HPI Kristina Owens reports that she was hospitalized from June 7 through January 18, 2021.  Hospital course:    Brief/Interim Summary: 55 year old with past medical history significant for anxiety, depression, GERD, bipolar, polysubstance abuse, alcohol abuse presented to ED with 2 weeks history of left leg pain.  Patient mostly recently used crack cocaine and methamphetamine and alcohol today prior to admission.  Patient was found to have elevated D-dimer at 1.3, bilateral lower extremity duplex: Positive for acute DVT involving left common femoral, left femoral, popliteal, posterior tibial, peroneal, soleal and gastrocnemius veins.  CTA chest showed multiple bilateral PE including saddle embolus with CT evidence of right heart strain. Patient was admitted for DVT and PE started on Lovenox.  She was subsequently transition to Eliquis.  She is spiking fever, and treated with IV antibiotics for UTI.      1-Acute unprovoked Left Lower extremity DVT/acute saddle PE with right heart strain by CT: -Patient was initially on Lovenox.  Subsequently transition to Eliquis 6/09. -Without Cor pulmonale.  -CTA;  Shows multiple bilateral PE including saddle embolus, and the bifurcation of the right pulmonary artery, saddle embolus at the bifurcation of right middle and right lower lobe pulmonary arteries and within bilateral lower lobes and right middle lobe. -Doppler lower extremity: Acute deep vein thrombosis involving the left common femoral vein, left femoral vein, left popliteal vein, left posterior tibial veins, left peroneal veins, left soleal veins and left gastrocnemius veins. -New onset chest pain  pleuritic on 6/14.  Suspect related to atelectasis/PNA  Patient vitals has remained stable.  Oxygen  saturation 97 on room air, blood pressure in the normal range.  Troponins x2 negative. -ECHO Normal RV function. -Worsening lower extremity edema 6/14, IR reconsulted for evaluation, had  CT venogram abdomen and pelvis. -incentive spirometry. -Underwent catheter directed mechanical thrombectomy with TPA of common femoral vein by IR 6/15. -Continue with eliquis. . Will need Hypercoagulable work up and screening malignancy.  -Stable. Plan to discharge today to [facility].   -will provide short course of opioids, 3 days.    2-Sepsis, Secondary to UTI/pneumonia: POA Met sepsis criteria with leukocytosis, tachycardia, tachypnea. Treated initially with ceftriaxone and azithromycin. Received ceftriaxone. Completed 7 days tx   3-polysubstance abuse: Counseling provided   Alcohol abuse, suspected alcohol withdrawal: She was monitored on CIWA protocol   Depression disorder, bipolar: Medication resume Lamictal BuSpar Zoloft and Abilify   Tobacco use counseling provided. . Hypokalemia; replaced.    States today that she was unable to tolerate Eliquis, has been switched to Coumadin.  States that she has been attending visits at the Coumadin clinic.  States that she continues to have left leg pain, along with intermittent swelling.  Reports that she has been using Tylenol and Norco for her pain.  States that she does have an upcoming appointment with the vascular surgeon.   Past Medical History:  Diagnosis Date   Anxiety    Depression    DVT (deep vein thrombosis) in pregnancy    GERD (gastroesophageal reflux disease)    Ovarian cyst     Past Surgical History:  Procedure Laterality Date   APPENDECTOMY  age 54   IR PTA VENOUS EXCEPT DIALYSIS CIRCUIT  01/16/2021   IR THROMBECT VENO MECH  MOD SED  01/16/2021   IR US GUIDE VASC ACCESS LEFT  01/16/2021   IR VENO/EXT/UNI LEFT  01/16/2021   IR VENOCAVAGRAM IVC  01/16/2021   TONSILLECTOMY Bilateral age 62    Family History  Problem Relation  Age of Onset   Asthma Mother    COPD Mother    Cancer Father        thyroid cancer    Social History   Socioeconomic History   Marital status: Divorced    Spouse name: Not on file   Number of children: Not on file   Years of education: Not on file   Highest education level: Not on file  Occupational History   Not on file  Tobacco Use   Smoking status: Every Day    Packs/day: 0.50    Years: 35.00    Pack years: 17.50    Types: Cigarettes   Smokeless tobacco: Never  Vaping Use   Vaping Use: Never used  Substance and Sexual Activity   Alcohol use: Yes   Drug use: Yes    Types: "Crack" cocaine, Cocaine, Methamphetamines    Comment: last used 4/31/22   Sexual activity: Not on file  Other Topics Concern   Not on file  Social History Narrative   Not on file   Social Determinants of Health   Financial Resource Strain: Not on file  Food Insecurity: Not on file  Transportation Needs: Not on file  Physical Activity: Not on file  Stress: Not on file  Social Connections: Not on file  Intimate Partner Violence: Not on file    Outpatient Medications Prior to Visit  Medication Sig Dispense Refill   acetaminophen (TYLENOL) 500 MG tablet Take 1,000 mg by mouth every 4 (four) hours as needed (for pain- ALTERNATING WITH IBUPROFEN).     ARIPiprazole (ABILIFY) 10 MG tablet Take 1 tablet (10 mg total) by mouth daily. 30 tablet 0   busPIRone (BUSPAR) 10 MG tablet Take 1 tablet (10 mg total) by mouth 3 (three) times daily. (Patient taking differently: Take 10 mg by mouth 3 (three) times daily as needed (for anxiety).) 30 tablet 1   lamoTRIgine (LAMICTAL) 25 MG tablet Take 1 tablet (25 mg total) by mouth daily. 30 tablet 0   sertraline (ZOLOFT) 50 MG tablet Take 1 tablet (50 mg total) by mouth daily. 30 tablet 2   warfarin (COUMADIN) 4 MG tablet Take 4 mg by mouth daily.     Artificial Tear Ointment (DRY EYES OP) Place 1 drop into both eyes daily as needed (red/dry eyes).     cefdinir  (OMNICEF) 300 MG capsule Take 300 mg by mouth See admin instructions. Bid x 5 days (Patient not taking: Reported on 03/12/2021)     HYDROcodone-acetaminophen (NORCO/VICODIN) 5-325 MG tablet Take 1 tablet by mouth every 6 (six) hours as needed for moderate pain. (Patient not taking: Reported on 03/12/2021)     oxyCODONE-acetaminophen (PERCOCET/ROXICET) 5-325 MG tablet Take 1 tablet by mouth every 4 (four) hours as needed for severe pain. (Patient not taking: Reported on 03/12/2021)     No facility-administered medications prior to visit.    No Known Allergies  ROS Review of Systems  Constitutional: Negative.   HENT: Negative.    Eyes: Negative.   Respiratory:  Negative for shortness of breath.   Cardiovascular:  Positive for leg swelling. Negative for chest pain.  Gastrointestinal: Negative.   Endocrine: Negative.   Genitourinary: Negative.   Musculoskeletal: Negative.   Skin: Negative.  Allergic/Immunologic: Negative.   Neurological: Negative.   Hematological: Negative.   Psychiatric/Behavioral: Negative.       Objective:    Physical Exam Vitals and nursing note reviewed.  Constitutional:      Appearance: Normal appearance.  HENT:     Head: Normocephalic and atraumatic.     Right Ear: External ear normal.     Left Ear: External ear normal.     Nose: Nose normal.     Mouth/Throat:     Mouth: Mucous membranes are moist.     Pharynx: Oropharynx is clear.  Eyes:     Extraocular Movements: Extraocular movements intact.     Conjunctiva/sclera: Conjunctivae normal.     Pupils: Pupils are equal, round, and reactive to light.  Cardiovascular:     Rate and Rhythm: Normal rate and regular rhythm.     Pulses: Normal pulses.     Heart sounds: Normal heart sounds.  Pulmonary:     Effort: Pulmonary effort is normal.     Breath sounds: Normal breath sounds.  Musculoskeletal:        General: Normal range of motion.     Cervical back: Normal range of motion and neck supple.     Right  lower leg: No tenderness. No edema.     Left lower leg: Tenderness present. No edema.     Comments: Calf tender to light palpation  Skin:    General: Skin is warm and dry.  Neurological:     General: No focal deficit present.     Mental Status: She is alert and oriented to person, place, and time.  Psychiatric:        Mood and Affect: Mood normal.        Behavior: Behavior normal.        Thought Content: Thought content normal.        Judgment: Judgment normal.    BP 115/65 (BP Location: Left Arm, Patient Position: Sitting, Cuff Size: Normal)   Pulse 74   Temp 98.5 F (36.9 C) (Oral)   Resp 16   Ht 5' 4"  (1.626 m)   Wt 150 lb (68 kg)   SpO2 100%   BMI 25.75 kg/m  Wt Readings from Last 3 Encounters:  03/12/21 150 lb (68 kg)  03/07/21 149 lb 0.5 oz (67.6 kg)  02/07/21 146 lb 0.8 oz (66.2 kg)     Health Maintenance Due  Topic Date Due   PAP SMEAR-Modifier  Never done   COVID-19 Vaccine (3 - Booster for Pfizer series) 09/11/2020   INFLUENZA VACCINE  03/04/2021    There are no preventive care reminders to display for this patient.  Lab Results  Component Value Date   TSH 1.250 02/07/2021   Lab Results  Component Value Date   WBC 7.7 03/06/2021   HGB 13.1 03/06/2021   HCT 39.7 03/06/2021   MCV 88.2 03/06/2021   PLT 314 03/06/2021   Lab Results  Component Value Date   NA 136 03/06/2021   K 3.9 03/06/2021   CO2 22 03/06/2021   GLUCOSE 112 (H) 03/06/2021   BUN 10 03/06/2021   CREATININE 0.64 03/06/2021   BILITOT 0.7 03/06/2021   ALKPHOS 84 03/06/2021   AST 16 03/06/2021   ALT 20 03/06/2021   PROT 6.7 03/06/2021   ALBUMIN 4.0 03/06/2021   CALCIUM 9.4 03/06/2021   ANIONGAP 10 03/06/2021   EGFR 89 02/07/2021   No results found for: CHOL No results found for: HDL No results found for:  [place] No results found for: TRIG No results found for: CHOLHDL No results found for: HGBA1C    Assessment & Plan:   Problem List Items Addressed This Visit        Cardiovascular and Mediastinum   Pulmonary embolism ([place])   DVT (deep venous thrombosis) ([place]) - Primary   Relevant Medications   HYDROcodone-acetaminophen (NORCO/VICODIN) 5-325 MG tablet   Other Relevant Orders   Ambulatory referral to Hematology / Oncology    Meds ordered this encounter  Medications   HYDROcodone-acetaminophen (NORCO/VICODIN) 5-325 MG tablet    Sig: Take 1 tablet by mouth every 4 (four) hours as needed for up to 5 days for moderate pain.    Dispense:  30 tablet    Refill:  0    Order Specific Question:   Supervising Provider    Answer:   Joya Gaskins, PATRICK E [[id number]]   1. Acute deep vein thrombosis (DVT) of proximal vein of left lower extremity [facility]) Referral to hematology for further evaluation.  Continue current pain regimen.  Check of [place] controlled substance registry within normal limits.  Red flags given for prompt reevaluation. - Ambulatory referral to Hematology / Oncology - HYDROcodone-acetaminophen (NORCO/VICODIN) 5-325 MG tablet; Take 1 tablet by mouth every 4 (four) hours as needed for up to 5 days for moderate pain.  Dispense: 30 tablet; Refill: 0  2. Acute saddle pulmonary embolism with acute cor pulmonale (HCC)    I have reviewed the patient's medical history (PMH, PSH, Social History, Family History, Medications, and allergies) , and have been updated if relevant. I spent 32 minutes reviewing chart and  face to face time with patient.    Follow-up: Return if symptoms worsen or fail to improve.    Loraine Grip Mayers, PA-C

## 2021-03-12 NOTE — Progress Notes (Signed)
Blood clots left leg Needs a referral hematology Seen providers in [place], with [facility] but would prefer to see [facility] specialist

## 2021-03-13 NOTE — Telephone Encounter (Signed)
error 

## 2021-03-19 ENCOUNTER — Other Ambulatory Visit: Payer: Self-pay

## 2021-03-19 ENCOUNTER — Ambulatory Visit
Admission: RE | Admit: 2021-03-19 | Discharge: 2021-03-19 | Disposition: A | Discharge status: Home / Self Care | Payer: Self-pay | Source: Ambulatory Visit | Attending: Radiology | Admitting: Radiology

## 2021-03-19 ENCOUNTER — Ambulatory Visit
Admission: RE | Admit: 2021-03-19 | Discharge: 2021-03-19 | Disposition: A | Discharge status: Home / Self Care | Payer: Medicaid Other | Source: Ambulatory Visit | Attending: Radiology | Admitting: Radiology

## 2021-03-19 DIAGNOSIS — I82402 Acute embolism and thrombosis of unspecified deep veins of left lower extremity: Secondary | ICD-10-CM

## 2021-03-19 HISTORY — PX: IR RADIOLOGIST EVAL & MGMT: IMG5224

## 2021-03-19 NOTE — Progress Notes (Signed)
Chief Complaint: Patient was seen in consultation today for DVT and PE at the request of Bruning,Kevin  Referring Physician(s): Bruning,Kevin  History of Present Illness: Kristina Owens is a 55 y.o. female with a history of acute PE and left lower extremity femoropopliteal and calf vein DVT. She was appropriately anticoagulated but continued to have progressive left lower extremity pain and swelling. Therefore, she underwent percutaneous catheter directed thrombectomy on 01/16/21.    Se presents today for F/U.  Unfortunately, her post hospital course has been quite complicated.  She felt awful on Eliquis and had a variety of complaints as well as suspected progression of DVT on US imaging.  She was therefore changed to coumadin.  She has gone back to the ER several times with recurrent chest pain, SOB, left leg pain, swelling and paresthesias.  Fortunately, she has had no imaging evidence of recurrent DVT.    She is pleased to not that she has finally been experiencing signficant clinical improvement over the past 1-2 weeks.  She currently has minimal LLE symptoms.   Past Medical History:  Diagnosis Date   Anxiety    Depression    DVT (deep vein thrombosis) in pregnancy    GERD (gastroesophageal reflux disease)    Ovarian cyst     Past Surgical History:  Procedure Laterality Date   APPENDECTOMY  age 61   IR PTA VENOUS EXCEPT DIALYSIS CIRCUIT  01/16/2021   IR RADIOLOGIST EVAL & MGMT  03/19/2021   IR THROMBECT VENO MECH MOD SED  01/16/2021   IR US GUIDE VASC ACCESS LEFT  01/16/2021   IR VENO/EXT/UNI LEFT  01/16/2021   IR VENOCAVAGRAM IVC  01/16/2021   TONSILLECTOMY Bilateral age 54    Allergies: Patient has no known allergies.  Medications: Prior to Admission medications   Medication Sig Start Date End Date Taking? Authorizing Provider  acetaminophen (TYLENOL) 500 MG tablet Take 1,000 mg by mouth every 4 (four) hours as needed (for pain- ALTERNATING WITH IBUPROFEN).     [provider]  ARIPiprazole (ABILIFY) 10 MG tablet Take 1 tablet (10 mg total) by mouth daily. 01/19/21   Regalado, Belkys A, MD  Artificial Tear Ointment (DRY EYES OP) Place 1 drop into both eyes daily as needed (red/dry eyes).    [provider]  busPIRone (BUSPAR) 10 MG tablet Take 1 tablet (10 mg total) by mouth 3 (three) times daily. Patient taking differently: Take 10 mg by mouth 3 (three) times daily as needed (for anxiety). 01/18/21   Regalado, Belkys A, MD  cefdinir (OMNICEF) 300 MG capsule Take 300 mg by mouth See admin instructions. Bid x 5 days Patient not taking: Reported on 03/12/2021    [provider]  lamoTRIgine (LAMICTAL) 25 MG tablet Take 1 tablet (25 mg total) by mouth daily. 01/19/21   Regalado, Belkys A, MD  sertraline (ZOLOFT) 50 MG tablet Take 1 tablet (50 mg total) by mouth daily. 01/18/21   Regalado, Belkys A, MD  warfarin (COUMADIN) 4 MG tablet Take 4 mg by mouth daily. 02/14/21   [provider]     Family History  Problem Relation Age of Onset   Asthma Mother    COPD Mother    Cancer Father        thyroid cancer    Social History   Socioeconomic History   Marital status: Divorced    Spouse name: Not on file   Number of children: Not on file   Years of education: Not on  file   Highest education level: Not on file  Occupational History   Not on file  Tobacco Use   Smoking status: Every Day    Packs/day: 0.50    Years: 35.00    Pack years: 17.50    Types: Cigarettes   Smokeless tobacco: Never  Vaping Use   Vaping Use: Never used  Substance and Sexual Activity   Alcohol use: Yes   Drug use: Yes    Types: "Crack" cocaine, Cocaine, Methamphetamines    Comment: last used 4/31/22   Sexual activity: Not on file  Other Topics Concern   Not on file  Social History Narrative   Not on file   Social Determinants of Health   Financial Resource Strain: Not on file  Food Insecurity: Not on file  Transportation Needs:  Not on file  Physical Activity: Not on file  Stress: Not on file  Social Connections: Not on file     Review of Systems: A 12 point ROS discussed and pertinent positives are indicated in the HPI above.  All other systems are negative.  Review of Systems  Vital Signs: There were no vitals taken for this visit.  Physical Exam Constitutional:      Appearance: Normal appearance.  HENT:     Head: Normocephalic and atraumatic.  Eyes:     General: No scleral icterus. Cardiovascular:     Rate and Rhythm: Normal rate.  Pulmonary:     Effort: Pulmonary effort is normal.  Abdominal:     General: Abdomen is flat.     Palpations: Abdomen is soft.  Musculoskeletal:        General: No swelling.  Skin:    General: Skin is warm and dry.  Neurological:     Mental Status: She is alert and oriented to person, place, and time.  Psychiatric:        Mood and Affect: Mood normal.        Behavior: Behavior normal.      Imaging: DG Chest 2 View  Result Date: 03/06/2021 CLINICAL DATA:  55 year old female with shortness of breath. EXAM: CHEST - 2 VIEW COMPARISON:  Chest radiograph dated 02/05/2021. FINDINGS: The heart size and mediastinal contours are within normal limits. Both lungs are clear. The visualized skeletal structures are unremarkable. IMPRESSION: No active cardiopulmonary disease. Electronically Signed   By: Elgie Collard M.D.   On: 03/06/2021 23:46   CT Angio Chest PE W and/or Wo Contrast  Result Date: 03/07/2021 CLINICAL DATA:  Pulmonary embolism suspected. History of thrombectomy and anticoagulation recently EXAM: CT ANGIOGRAPHY CHEST WITH CONTRAST TECHNIQUE: Multidetector CT imaging of the chest was performed using the standard protocol during bolus administration of intravenous contrast. Multiplanar CT image reconstructions and MIPs were obtained to evaluate the vascular anatomy. CONTRAST:  61mL OMNIPAQUE IOHEXOL 350 MG/ML SOLN COMPARISON:  02/06/2021 FINDINGS: Cardiovascular:  Further reduction in recent pulmonary emboli with only a faint web seen in the left lower lobe pulmonary artery. No acute or occlusive disease is seen. Normal heart size. No pericardial effusion. Mediastinum/Nodes: Negative for adenopathy or mass Lungs/Pleura: Subpleural scarring in the left lower lobe. There is no edema, consolidation, effusion, or pneumothorax. Upper Abdomen: No acute finding Musculoskeletal: No acute finding Review of the MIP images confirms the above findings. IMPRESSION: Continued regression of recent pulmonary emboli with only a tiny web seen in the left lower lobe. No acute finding. Electronically Signed   By: Marnee Spring M.D.   On: 03/07/2021 07:18   US  Venous Img Lower Bilateral (DVT)  Result Date: 03/19/2021 CLINICAL DATA:  55 year old female with a history of extensive left lower extremity DVT status post mechanical thrombectomy 02/15/2021 EXAM: BILATERAL LOWER EXTREMITY VENOUS DOPPLER ULTRASOUND TECHNIQUE: Gray-scale sonography with graded compression, as well as color Doppler and duplex ultrasound were performed to evaluate the lower extremity deep venous systems from the level of the common femoral vein and including the common femoral, femoral, profunda femoral, popliteal and calf veins including the posterior tibial, peroneal and gastrocnemius veins when visible. The superficial great saphenous vein was also interrogated. Spectral Doppler was utilized to evaluate flow at rest and with distal augmentation maneuvers in the common femoral, femoral and popliteal veins. COMPARISON:  None. FINDINGS: RIGHT LOWER EXTREMITY Common Femoral Vein: No evidence of thrombus. Normal compressibility, respiratory phasicity and response to augmentation. Saphenofemoral Junction: No evidence of thrombus. Normal compressibility and flow on color Doppler imaging. Profunda Femoral Vein: No evidence of thrombus. Normal compressibility and flow on color Doppler imaging. Femoral Vein: No evidence of  thrombus. Normal compressibility, respiratory phasicity and response to augmentation. Popliteal Vein: No evidence of thrombus. Normal compressibility, respiratory phasicity and response to augmentation. Calf Veins: No evidence of thrombus. Normal compressibility and flow on color Doppler imaging. Superficial Great Saphenous Vein: No evidence of thrombus. Normal compressibility. Venous Reflux:  None. Other Findings:  None. LEFT LOWER EXTREMITY Common Femoral Vein: No evidence of thrombus. Normal compressibility, respiratory phasicity and response to augmentation. Saphenofemoral Junction: No evidence of thrombus. Normal compressibility and flow on color Doppler imaging. Profunda Femoral Vein: No evidence of thrombus. Normal compressibility and flow on color Doppler imaging. Femoral Vein: No evidence of thrombus. Normal compressibility, respiratory phasicity and response to augmentation. Popliteal Vein: Nonocclusive residual DVT present within the popliteal vein. No evidence of acute thrombus. Calf Veins: Overall, calf veins are not particularly well seen on grayscale imaging. However, on color Doppler imaging segmental flow present within the posterior tibial and peroneal veins. There is likely some residual nonocclusive chronic DVT. Superficial Great Saphenous Vein: No evidence of thrombus. Normal compressibility. Venous Reflux:  None. Other Findings:  None. IMPRESSION: 1. Continued patency of the previously treated left common femoral, femoral and proximal popliteal veins. 2. 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 Electronically Signed   By: Malachy Moan M.D.   On: 03/19/2021 12:53   VAS [place] LOWER EXTREMITY VENOUS (DVT) (ONLY [facility] & [facility])  Result Date: 03/07/2021  Lower Venous DVT Study Patient Name:  Kristina Owens  Date of Exam:   03/07/2021 Medical Rec #: [id number]          Accession #:    [id number] Date of Birth: August 06, 1965         Patient Gender: F Patient Age:   26Y Exam  Location:  [facility] Procedure:      VAS [place] LOWER EXTREMITY VENOUS (DVT) Referring Phys: Glynn Octave --------------------------------------------------------------------------------  Indications: Pain, Swelling, and Extensive history of left lower extremity DVT.  Comparison Study: 01/08/2021 - Left lower extremity venous duplex -                   RIGHT:                   - There is no evidence of deep vein thrombosis in the lower                   extremity.  LEFT:                   - Findings consistent with acute deep vein thrombosis                   involving the left common                   femoral vein, left femoral vein, left popliteal vein, left                   posterior tibial veins, left                   peroneal veins, left soleal veins, and left                   gastrocnemius veins.                   - No cystic structure found in the popliteal fossa.                    01/29/2021 - Left lower extremity venous duplex -                   RIGHT:                   - No evidence of common femoral vein obstruction.                   LEFT:                   - Findings consistent with acute deep vein thrombosis                   involving the SF junction,                   and left popliteal vein.                   - Findings consistent with acute superficial vein thrombosis                   involving the left                   great saphenous vein.                   - Findings consistent with age indeterminate deep vein                   thrombosis                   involving the left proximal profunda vein.                   - Findings consistent with chronic deep vein thrombosis                   involving the left                   common femoral vein, left posterior tibial veins, left                   peroneal veins,                   left soleal veins, and left gastrocnemius veins.                   -  No cystic structure found in the popliteal fossa.                     02/05/2021 - Left lower extremity venous duplex -                   RIGHT:                   - No evidence of common femoral vein obstruction.                   LEFT:                   - Findings consistent with acute deep vein thrombosis                   involving the left popliteal                   vein, and left soleal veins.                   - Findings consistent with age indeterminate deep vein                   thrombosis                   involving the left posterior tibial veins, and left peroneal                   veins.                   - Findings consistent with chronic deep vein thrombosis                   involving the left                   common femoral vein, SF junction, and left proximal profunda                   vein.                   - No cystic structure found in the popliteal fossa.                    02/25/21- Left lower extremity venous duplex                   RIGHT:                   - No evidence of common femoral vein obstruction.                   LEFT:                   - Findings consistent with age indeterminate deep vein                   thrombosis involving the left popliteal vein, and left                   posterior tibial veins.                   - No cystic structure found in the popliteal fossa. Performing Technologist: Gertie FeyMichelle Simonetti MHA, RDMS, RVT, RDCS  Examination Guidelines: A complete evaluation includes B-mode imaging, spectral Doppler, color Doppler, and power Doppler as needed of all accessible  portions of each vessel. Bilateral testing is considered an integral part of a complete examination. Limited examinations for reoccurring indications may be performed as noted. The reflux portion of the exam is performed with the patient in reverse Trendelenburg.  +-----+---------------+---------+-----------+----------+--------------+ RIGHTCompressibilityPhasicitySpontaneityPropertiesThrombus Aging  +-----+---------------+---------+-----------+----------+--------------+ CFV  Full           Yes      Yes                                 +-----+---------------+---------+-----------+----------+--------------+   +---------+---------------+---------+-----------+----------+-----------------+ LEFT     CompressibilityPhasicitySpontaneityPropertiesThrombus Aging    +---------+---------------+---------+-----------+----------+-----------------+ CFV      Full                                                           +---------+---------------+---------+-----------+----------+-----------------+ SFJ      Partial        Yes      Yes                  Chronic           +---------+---------------+---------+-----------+----------+-----------------+ FV Prox  Full                                                           +---------+---------------+---------+-----------+----------+-----------------+ FV Mid   Full                                                           +---------+---------------+---------+-----------+----------+-----------------+ FV DistalPartial                                      Chronic           +---------+---------------+---------+-----------+----------+-----------------+ PFV      Full                                                           +---------+---------------+---------+-----------+----------+-----------------+ POP      Partial        Yes      Yes                  Chronic           +---------+---------------+---------+-----------+----------+-----------------+ PTV      None                    No                   Acute             +---------+---------------+---------+-----------+----------+-----------------+ PERO     None  No                   Age Indeterminate +---------+---------------+---------+-----------+----------+-----------------+ Gastroc  Full                                                            +---------+---------------+---------+-----------+----------+-----------------+     Summary: RIGHT: - No evidence of common femoral vein obstruction.  LEFT: - Findings consistent with acute deep vein thrombosis involving the left posterior tibial veins. - Findings consistent with age indeterminate deep vein thrombosis involving the left peroneal veins. - Findings consistent with chronic deep vein thrombosis involving the SF junction, left femoral vein, and left popliteal vein. - No cystic structure found in the popliteal fossa.  When compared to the prior study, the thrombus involving the left posterior tibial veins seems to have progressed.  *See table(s) above for measurements and observations. Electronically signed by Lemar Livings MD on 03/07/2021 at 3:48:06 PM.    Final    VAS [place] LOWER EXTREMITY VENOUS (DVT) (ONLY [facility] & [facility] 7a-7p)  Result Date: 02/25/2021  Lower Venous DVT Study Patient Name:  Kristina Owens  Date of Exam:   02/25/2021 Medical Rec #: [id number]          Accession #:    [id number] Date of Birth: 09/30/1965         Patient Gender: F Patient Age:   54Y Exam Location:  [facility] Procedure:      VAS [place] LOWER EXTREMITY VENOUS (DVT) Referring Phys: [id number] Leonie Douglas SOTO --------------------------------------------------------------------------------  Indications: Swelling.  Risk Factors: DVT. Anticoagulation: Coumadin. Comparison Study: 01/08/2021 - Left lower extremity venous duplex -                   RIGHT:                   - There is no evidence of deep vein thrombosis in the lower                   extremity.                    LEFT:                   - Findings consistent with acute deep vein thrombosis                   involving the left common femoral vein, left femoral vein,                   left popliteal vein, left posterior tibial veins, left                   peroneal veins, left soleal veins, and left                   gastrocnemius veins.                   - No cystic  structure found in the popliteal fossa.                    01/29/2021 - Left lower extremity venous duplex -  RIGHT:                   - No evidence of common femoral vein obstruction.                    LEFT:                   - Findings consistent with acute deep vein thrombosis                   involving the SF junction, and left popliteal vein.                   - Findings consistent with acute superficial vein thrombosis                   involving the left great saphenous vein.                   - Findings consistent with age indeterminate deep vein                   thrombosis                   involving the left proximal profunda vein.                   - Findings consistent with chronic deep vein thrombosis                   involving the left common femoral vein, left posterior tibial                   veins, left peroneal veins,                   left soleal veins, and left gastrocnemius veins.                   - No cystic structure found in the popliteal fossa.                    02/05/2021 - Left lower extremity venous duplex -                   RIGHT:                   - No evidence of common femoral vein obstruction.                    LEFT:                   - Findings consistent with acute deep vein thrombosis                   involving the left popliteal vein, and left soleal veins.                   - Findings consistent with age indeterminate deep vein                   thrombosis                   involving the left posterior tibial veins, and left peroneal                   veins.                   -  Findings consistent with chronic deep vein thrombosis                   involving the left common femoral vein, SF junction, and left                   proximal profunda vein.                   - No cystic structure found in the popliteal fossa. Performing Technologist: Chanda Busing RVT  Examination Guidelines: A complete evaluation includes B-mode imaging, spectral Doppler,  color Doppler, and power Doppler as needed of all accessible portions of each vessel. Bilateral testing is considered an integral part of a complete examination. Limited examinations for reoccurring indications may be performed as noted. The reflux portion of the exam is performed with the patient in reverse Trendelenburg.  +-----+---------------+---------+-----------+----------+--------------+ RIGHTCompressibilityPhasicitySpontaneityPropertiesThrombus Aging +-----+---------------+---------+-----------+----------+--------------+ CFV  Full           Yes      Yes                                 +-----+---------------+---------+-----------+----------+--------------+   +---------+---------------+---------+-----------+----------+-----------------+ LEFT     CompressibilityPhasicitySpontaneityPropertiesThrombus Aging    +---------+---------------+---------+-----------+----------+-----------------+ CFV      Full           Yes      Yes                                    +---------+---------------+---------+-----------+----------+-----------------+ SFJ      Full                                                           +---------+---------------+---------+-----------+----------+-----------------+ FV Prox  Full                                                           +---------+---------------+---------+-----------+----------+-----------------+ FV Mid   Full                                                           +---------+---------------+---------+-----------+----------+-----------------+ FV DistalFull                                                           +---------+---------------+---------+-----------+----------+-----------------+ PFV      Full                                                           +---------+---------------+---------+-----------+----------+-----------------+  POP      Partial        No       No                   Age  Indeterminate +---------+---------------+---------+-----------+----------+-----------------+ PTV      Partial                                      Age Indeterminate +---------+---------------+---------+-----------+----------+-----------------+ PERO     Full                                                           +---------+---------------+---------+-----------+----------+-----------------+ Soleal   Full                                                           +---------+---------------+---------+-----------+----------+-----------------+ Gastroc  Full                                                           +---------+---------------+---------+-----------+----------+-----------------+     Summary: RIGHT: - No evidence of common femoral vein obstruction.  LEFT: - Findings consistent with age indeterminate deep vein thrombosis involving the left popliteal vein, and left posterior tibial veins. - No cystic structure found in the popliteal fossa.  *See table(s) above for measurements and observations. Electronically signed by Fabienne Bruns MD on 02/25/2021 at 4:30:40 PM.    Final    IR Radiologist Eval & Mgmt  Result Date: 03/19/2021 Please refer to notes tab for details about interventional procedure. (Op Note)   Labs:  CBC: Recent Labs    02/05/21 1629 02/07/21 1438 02/25/21 1937 03/06/21 2325  WBC 7.0 6.7 6.7 7.7  HGB 14.7 14.3 14.1 13.1  HCT 44.9 42.5 43.4 39.7  PLT 278 250 369 314    COAGS: Recent Labs    01/16/21 0505 02/05/21 1629 02/25/21 1937 03/06/21 2325  INR 1.2 1.0 2.8* 1.6*    BMP: Recent Labs    01/28/21 1917 02/05/21 1629 02/07/21 1438 02/25/21 1937 03/06/21 2325  NA 140 137 141 137 136  K 4.2 3.9 4.8 5.1 3.9  CL 110 105 104 102 104  CO2 20* 21*  --  24 22  GLUCOSE 80 121* 82 97 112*  BUN 12 9 10 10 10   CALCIUM 9.2 9.4 9.4 9.5 9.4  CREATININE 0.71 0.78 0.79 0.81 0.64  GFRNONAA >60 >60  --  >60 >60    LIVER FUNCTION  TESTS: Recent Labs    02/05/21 1629 02/07/21 1438 02/25/21 1937 03/06/21 2325  BILITOT 0.7 0.2 0.3 0.7  AST 16 14 47* 16  ALT 23  --  41 20  ALKPHOS 88 101 87 84  PROT 7.2 7.0 8.1 6.7  ALBUMIN 3.9 4.8 4.7 4.0    TUMOR MARKERS: No results for input(s): AFPTM, CEA, CA199, CHROMGRNA in the last 8760 hours.  Assessment and Plan:  Doing better s/p LLE thrombectomy.  She has some residual chronic but nonocclusive DVT in the popliteal and calf veins.  This is somewhat surprising given that she has been on anticoagulation for the past 2 months.  I think this illustrates that her intrinsic thrombolytic ability is limited and also illustrates that Eliquis was not effective for her.  She has been improving more since switching to warfarin.    I am very pleased that her common femoral, femoral and proximal popliteal veins remain thrombus free following her intervention.   I suspect she may require longer duration anticoagulation therapy.  I would like to see her chronic DVT resolve as much as possible before considering cessation of anticoagulation.  My concern is that disrupted flow/turbulence will lead to rethrombosis in the calf and at the knee if we come off too soon.    1.) Continue anticoagulation and use of left knee-high compression hose 2.) Repeat LLE duplex venous US and clinic visit in 3 months   Electronically Signed: Sterling Big 03/19/2021, 4:33 PM   I spent a total of  25 Minutes in face to face in clinical consultation, greater than 50% of which was counseling/coordinating care for left lower extremity DVT.

## 2021-03-21 ENCOUNTER — Inpatient Hospital Stay: Payer: Medicaid Other | Attending: Hematology & Oncology

## 2021-03-21 ENCOUNTER — Encounter: Payer: Self-pay | Admitting: Hematology & Oncology

## 2021-03-21 ENCOUNTER — Telehealth: Payer: Self-pay

## 2021-03-21 ENCOUNTER — Other Ambulatory Visit: Payer: Self-pay

## 2021-03-21 ENCOUNTER — Inpatient Hospital Stay (HOSPITAL_BASED_OUTPATIENT_CLINIC_OR_DEPARTMENT_OTHER): Payer: Self-pay | Admitting: Hematology & Oncology

## 2021-03-21 VITALS — BP 115/73 | HR 63 | Temp 98.9°F | Resp 18 | Wt 153.0 lb

## 2021-03-21 DIAGNOSIS — I2601 Septic pulmonary embolism with acute cor pulmonale: Secondary | ICD-10-CM

## 2021-03-21 DIAGNOSIS — I2699 Other pulmonary embolism without acute cor pulmonale: Secondary | ICD-10-CM | POA: Insufficient documentation

## 2021-03-21 DIAGNOSIS — I824Z2 Acute embolism and thrombosis of unspecified deep veins of left distal lower extremity: Secondary | ICD-10-CM | POA: Insufficient documentation

## 2021-03-21 DIAGNOSIS — Z7901 Long term (current) use of anticoagulants: Secondary | ICD-10-CM | POA: Insufficient documentation

## 2021-03-21 LAB — CBC WITH DIFFERENTIAL (CANCER CENTER ONLY)
Abs Immature Granulocytes: 0.03 10*3/uL (ref 0.00–0.07)
Basophils Absolute: 0 10*3/uL (ref 0.0–0.1)
Basophils Relative: 0 %
Eosinophils Absolute: 0.1 10*3/uL (ref 0.0–0.5)
Eosinophils Relative: 2 %
HCT: 39.9 % (ref 36.0–46.0)
Hemoglobin: 13.1 g/dL (ref 12.0–15.0)
Immature Granulocytes: 1 %
Lymphocytes Relative: 47 %
Lymphs Abs: 2.3 10*3/uL (ref 0.7–4.0)
MCH: 28.8 pg (ref 26.0–34.0)
MCHC: 32.8 g/dL (ref 30.0–36.0)
MCV: 87.7 fL (ref 80.0–100.0)
Monocytes Absolute: 0.4 10*3/uL (ref 0.1–1.0)
Monocytes Relative: 7 %
Neutro Abs: 2.1 10*3/uL (ref 1.7–7.7)
Neutrophils Relative %: 43 %
Platelet Count: 295 10*3/uL (ref 150–400)
RBC: 4.55 MIL/uL (ref 3.87–5.11)
RDW: 13.4 % (ref 11.5–15.5)
WBC Count: 5 10*3/uL (ref 4.0–10.5)
nRBC: 0 % (ref 0.0–0.2)

## 2021-03-21 LAB — CMP (CANCER CENTER ONLY)
ALT: 21 U/L (ref 0–44)
AST: 17 U/L (ref 15–41)
Albumin: 4.3 g/dL (ref 3.5–5.0)
Alkaline Phosphatase: 85 U/L (ref 38–126)
Anion gap: 8 (ref 5–15)
BUN: 10 mg/dL (ref 6–20)
CO2: 26 mmol/L (ref 22–32)
Calcium: 9.5 mg/dL (ref 8.9–10.3)
Chloride: 104 mmol/L (ref 98–111)
Creatinine: 0.75 mg/dL (ref 0.44–1.00)
GFR, Estimated: >60 mL/min (ref >60)
Glucose, Bld: 70 mg/dL (ref 70–99)
Potassium: 4 mmol/L (ref 3.5–5.1)
Sodium: 138 mmol/L (ref 135–145)
Total Bilirubin: 0.4 mg/dL (ref 0.3–1.2)
Total Protein: 7.3 g/dL (ref 6.5–8.1)

## 2021-03-21 LAB — ANTITHROMBIN III: AntiThromb III Func: 134 % — ABNORMAL HIGH (ref 75–120)

## 2021-03-21 NOTE — Telephone Encounter (Signed)
Appts made per 03/21/21 los and pt req to view on mychart and placed in her calendar  Kristina Owens

## 2021-03-21 NOTE — Progress Notes (Signed)
Referral MD  Reason for Referral: Extensive DVT of the left leg/pulmonary embolism  Chief Complaint  Patient presents with   New Patient (Initial Visit)  : I need to know why I am having blood clots.  HPI: Kristina Owens is an incredibly charming 55 year old white female.  She is from [place].  She actually has lived up in [place].  We had a good time talking about this.  We actually lived in the same area.  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  She started having problems back in June.  About a week before she went to the emergency room, she began to have some pain and swelling in the left ankle.  She thought maybe that she had sprained it.  She also does admit to taking cocaine and methamphetamine.  She said that she took cocaine a day or so before she went to the emergency room.  She finally ended up in the emergency room.  She had a Doppler done on 01/08/2021.  This showed an acute thrombus involving the saphenofemoral junction and left popliteal vein.  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  Of note, back in June, she had a CT angiogram done.  This showed multiple bilateral pulmonary emboli including saddle embolus at the bifurcation.  There was some right heart strain.  She was initially put on Lovenox and then transitioned over to Eliquis.  However, things seem to got worse for her.  She ultimately had to go to interventional radiology and she had a mechanical thrombectomy with TPA.  This was done on 01/16/2021.  She currently is on Coumadin.  She still has some swelling in the left leg.  No thrombophilic studies were done.  She has no family history of blood clots.  There is a family history of cancer.  She has not had a mammogram as of yet.  She has had there is some blood in her stool.  I think she is going to  see Gastroenterology later on today.  She has 1 child.  She has never had a miscarriage.  She carried her son into term.  She says she is not using any recreational drugs.  She is smoking still.  She says she vapes.  She was smoking about a pack per day previously.  She does not use any oral contraceptives.  She never been on any estrogen contraceptives.  She is really not had any past surgery.  Before the thromboembolic disease, she had no extensive traveling.  Looks like she is in pretty decent shape.  She is not a vegetarian.  She is kindly referred to the [facility] for recommendations as to how we can manage the thromboembolic disease.  Her most recent CT angiogram was done on 03/07/2021.  This showed resolution of practically all of her thromboembolic disease.  There was may be a tiny little bit in the left lower lobe.  A recent Doppler of her left leg was done on 03/19/2021.  She has patency of the left common femoral vein.  There is residual nonocclusive thrombus in the left popliteal and gastrocnemius veins.  Currently, her performance status is ECOG 1.  Past Medical History:  Diagnosis Date   Anxiety    Depression    DVT (deep vein thrombosis) in pregnancy    GERD (gastroesophageal reflux  disease)    Ovarian cyst   :   Past Surgical History:  Procedure Laterality Date   APPENDECTOMY  age 37   IR PTA VENOUS EXCEPT DIALYSIS CIRCUIT  01/16/2021   IR RADIOLOGIST EVAL & MGMT  03/19/2021   IR THROMBECT VENO MECH MOD SED  01/16/2021   IR US GUIDE VASC ACCESS LEFT  01/16/2021   IR VENO/EXT/UNI LEFT  01/16/2021   IR VENOCAVAGRAM IVC  01/16/2021   TONSILLECTOMY Bilateral age 49  :   Current Outpatient Medications:    acetaminophen (TYLENOL) 500 MG tablet, Take 1,000 mg by mouth every 4 (four) hours as needed (for pain- ALTERNATING WITH IBUPROFEN)., Disp: , Rfl:    ARIPiprazole (ABILIFY) 10 MG tablet, Take 1 tablet (10 mg total) by mouth daily., Disp: 30  tablet, Rfl: 0   busPIRone (BUSPAR) 10 MG tablet, Take 1 tablet (10 mg total) by mouth 3 (three) times daily. (Patient taking differently: Take 10 mg by mouth 3 (three) times daily as needed (for anxiety).), Disp: 30 tablet, Rfl: 1   lamoTRIgine (LAMICTAL) 25 MG tablet, Take 1 tablet (25 mg total) by mouth daily., Disp: 30 tablet, Rfl: 0   sertraline (ZOLOFT) 50 MG tablet, Take 1 tablet (50 mg total) by mouth daily., Disp: 30 tablet, Rfl: 2   warfarin (COUMADIN) 4 MG tablet, Take 4 mg by mouth daily., Disp: , Rfl: :  :  No Known Allergies:   Family History  Problem Relation Age of Onset   Asthma Mother    COPD Mother    Cancer Father        thyroid cancer  :   Social History   Socioeconomic History   Marital status: Divorced    Spouse name: Not on file   Number of children: Not on file   Years of education: Not on file   Highest education level: Not on file  Occupational History   Not on file  Tobacco Use   Smoking status: Every Day    Packs/day: 0.50    Years: 35.00    Pack years: 17.50    Types: Cigarettes   Smokeless tobacco: Never  Vaping Use   Vaping Use: Never used  Substance and Sexual Activity   Alcohol use: Yes   Drug use: Yes    Types: "Crack" cocaine, Cocaine, Methamphetamines    Comment: last used 4/31/22   Sexual activity: Not on file  Other Topics Concern   Not on file  Social History Narrative   Not on file   Social Determinants of Health   Financial Resource Strain: Not on file  Food Insecurity: Not on file  Transportation Needs: Not on file  Physical Activity: Not on file  Stress: Not on file  Social Connections: Not on file  Intimate Partner Violence: Not on file  :  Review of Systems  Constitutional: Negative.   HENT: Negative.    Eyes: Negative.   Respiratory: Negative.    Cardiovascular: Negative.   Gastrointestinal: Negative.   Genitourinary: Negative.   Musculoskeletal:  Positive for joint pain and myalgias.  Skin: Negative.    Neurological: Negative.   Endo/Heme/Allergies: Negative.   Psychiatric/Behavioral:  Positive for substance abuse.     Exam: @IPVITALS @ This is a well-developed and well-nourished white female in no obvious distress.  Vital signs show a temperature of 98.9.  Pulse 63.  Blood pressure 115/73.  Weight is 153 pounds.  Head and neck exam shows no ocular or oral lesions.  She has no  palpable cervical or supraclavicular lymph nodes.  Lungs are clear bilaterally.  Cardiac exam regular rate and rhythm with no murmurs, rubs or bruits.  Axillary exam shows no bilateral axillary adenopathy.  Abdomen is soft.  She has good bowel sounds.  There is no fluid wave.  There is no palpable liver or spleen tip.  Back exam shows no tenderness over the spine, ribs or hips.  Extremities shows some mild nonpitting edema of the left lower leg.  She has a negative Homans' sign.  She has good pulses in her distal extremity.  She has no palpable venous cord in the legs.  She has good range of motion of her joints.  Neurological exam shows no focal neurological deficits.  Skin exam shows no rashes, ecchymoses or petechia.   Recent Labs    03/21/21 1007  WBC 5.0  HGB 13.1  HCT 39.9  PLT 295    Recent Labs    03/21/21 1007  NA 138  K 4.0  CL 104  CO2 26  GLUCOSE 70  BUN 10  CREATININE 0.75  CALCIUM 9.5    Blood smear review: None  Pathology: None    Assessment and Plan: Kristina Owens -this is a really great last name -is a very nice 55 year old white female.  She has a thromboembolic issue.  She had extensive thrombus in the left leg and also bilateral pulmonary emboli.  She had a mechanical thrombectomy.  She had TPA.  She currently is on Coumadin.  I would have to think that there might be some underlying hypercoagulable condition.  It would have been nice if hypercoagulable studies would have been done before she got on Coumadin.  I am just surprised that none were done on her when she was  hospitalized.  Regardless, it would not surprise me if she had a positive lupus anticoagulant.  I do think that her taking the cocaine probably had something to do with the thromboembolic disease but I do not think this was the major factor for her to have blood clots.  We sent off a comprehensive thromboembolic panel on her.  She really needs to be on lifelong anticoagulation at this point time.  I would keep her INR around 3.  I really believe that she needs a higher level of anticoagulation.  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  She does have a compression stocking.  I know that there is some debate as to whether or not this really does help.  I told her that she would need to stay hydrated.  The fact that she is not taking recreational drugs and not smoking certainly will help her out.  I would like to see her back in 6 weeks.  She does see the Coumadin clinic.  I am sure they will do a great job in maintaining her INR around 3.  I spent a good hour with her today.  And, she is really wonderful to talk to.  She is quite delightful.

## 2021-03-22 LAB — CARDIOLIPIN ANTIBODIES, IGG, IGM, IGA
Anticardiolipin IgA: <9 APL U/mL (ref 0–11)
Anticardiolipin IgG: <9 GPL U/mL (ref 0–14)
Anticardiolipin IgM: <9 MPL U/mL (ref 0–12)

## 2021-03-22 LAB — LUPUS ANTICOAGULANT PANEL
DRVVT: 43.8 s (ref 0.0–47.0)
PTT Lupus Anticoagulant: 40.3 s (ref 0.0–51.9)

## 2021-03-22 LAB — PROTEIN S, TOTAL: Protein S Ag, Total: 43 % — ABNORMAL LOW (ref 60–150)

## 2021-03-22 LAB — PROTEIN S ACTIVITY: Protein S Activity: 31 % — ABNORMAL LOW (ref 63–140)

## 2021-03-22 LAB — HOMOCYSTEINE: Homocysteine: 8.4 umol/L (ref 0.0–14.5)

## 2021-03-22 LAB — PROTEIN C ACTIVITY: Protein C Activity: 70 % — ABNORMAL LOW (ref 73–180)

## 2021-03-23 LAB — BETA-2-GLYCOPROTEIN I ABS, IGG/M/A
Beta-2 Glyco I IgG: <9 GPI IgG units (ref 0–20)
Beta-2-Glycoprotein I IgA: <9 GPI IgA units (ref 0–25)
Beta-2-Glycoprotein I IgM: <9 GPI IgM units (ref 0–32)

## 2021-03-23 LAB — PROTEIN C, TOTAL: Protein C, Total: 52 % — ABNORMAL LOW (ref 60–150)

## 2021-03-25 LAB — FACTOR 5 LEIDEN

## 2021-03-26 ENCOUNTER — Encounter: Payer: Self-pay | Admitting: *Deleted

## 2021-03-27 LAB — PROTHROMBIN GENE MUTATION

## 2021-05-01 ENCOUNTER — Ambulatory Visit: Payer: Medicaid Other | Admitting: Hematology & Oncology

## 2021-05-01 ENCOUNTER — Other Ambulatory Visit: Payer: Medicaid Other

## 2021-05-10 ENCOUNTER — Ambulatory Visit: Payer: Self-pay | Admitting: Nurse Practitioner

## 2021-05-30 ENCOUNTER — Other Ambulatory Visit: Payer: Self-pay | Admitting: Interventional Radiology

## 2021-05-30 DIAGNOSIS — I82402 Acute embolism and thrombosis of unspecified deep veins of left lower extremity: Secondary | ICD-10-CM

## 2021-06-12 ENCOUNTER — Emergency Department (HOSPITAL_COMMUNITY)
Admission: EM | Admit: 2021-06-12 | Discharge: 2021-06-13 | Disposition: A | Discharge status: Other Acute Inpatient Hospital | Payer: Self-pay | Attending: Emergency Medicine | Admitting: Emergency Medicine

## 2021-06-12 ENCOUNTER — Emergency Department (HOSPITAL_COMMUNITY): Payer: Self-pay

## 2021-06-12 ENCOUNTER — Encounter (HOSPITAL_COMMUNITY): Payer: Self-pay

## 2021-06-12 DIAGNOSIS — N9489 Other specified conditions associated with female genital organs and menstrual cycle: Secondary | ICD-10-CM | POA: Insufficient documentation

## 2021-06-12 DIAGNOSIS — F1914 Other psychoactive substance abuse with psychoactive substance-induced mood disorder: Secondary | ICD-10-CM | POA: Insufficient documentation

## 2021-06-12 DIAGNOSIS — Z7901 Long term (current) use of anticoagulants: Secondary | ICD-10-CM | POA: Insufficient documentation

## 2021-06-12 DIAGNOSIS — Y907 Blood alcohol level of 200-239 mg/100 ml: Secondary | ICD-10-CM | POA: Insufficient documentation

## 2021-06-12 DIAGNOSIS — Z20822 Contact with and (suspected) exposure to covid-19: Secondary | ICD-10-CM | POA: Insufficient documentation

## 2021-06-12 DIAGNOSIS — F1911 Other psychoactive substance abuse, in remission: Secondary | ICD-10-CM | POA: Diagnosis present

## 2021-06-12 DIAGNOSIS — F17210 Nicotine dependence, cigarettes, uncomplicated: Secondary | ICD-10-CM | POA: Insufficient documentation

## 2021-06-12 DIAGNOSIS — F1420 Cocaine dependence, uncomplicated: Secondary | ICD-10-CM | POA: Diagnosis present

## 2021-06-12 DIAGNOSIS — R0602 Shortness of breath: Secondary | ICD-10-CM | POA: Insufficient documentation

## 2021-06-12 DIAGNOSIS — F1920 Other psychoactive substance dependence, uncomplicated: Secondary | ICD-10-CM | POA: Diagnosis present

## 2021-06-12 DIAGNOSIS — R45851 Suicidal ideations: Secondary | ICD-10-CM | POA: Insufficient documentation

## 2021-06-12 DIAGNOSIS — F1999 Other psychoactive substance use, unspecified with unspecified psychoactive substance-induced disorder: Secondary | ICD-10-CM

## 2021-06-12 LAB — COMPREHENSIVE METABOLIC PANEL
ALT: 23 U/L (ref 0–44)
AST: 25 U/L (ref 15–41)
Albumin: 4.2 g/dL (ref 3.5–5.0)
Alkaline Phosphatase: 114 U/L (ref 38–126)
Anion gap: 8 (ref 5–15)
BUN: <5 mg/dL — ABNORMAL LOW (ref 6–20)
CO2: 27 mmol/L (ref 22–32)
Calcium: 8.7 mg/dL — ABNORMAL LOW (ref 8.9–10.3)
Chloride: 103 mmol/L (ref 98–111)
Creatinine, Ser: 0.53 mg/dL (ref 0.44–1.00)
GFR, Estimated: >60 mL/min (ref >60)
Glucose, Bld: 140 mg/dL — ABNORMAL HIGH (ref 70–99)
Potassium: 3.9 mmol/L (ref 3.5–5.1)
Sodium: 138 mmol/L (ref 135–145)
Total Bilirubin: 0.6 mg/dL (ref 0.3–1.2)
Total Protein: 7.4 g/dL (ref 6.5–8.1)

## 2021-06-12 LAB — ACETAMINOPHEN LEVEL: Acetaminophen (Tylenol), Serum: <10 ug/mL — ABNORMAL LOW (ref 10–30)

## 2021-06-12 LAB — CBC
HCT: 46.6 % — ABNORMAL HIGH (ref 36.0–46.0)
Hemoglobin: 15.3 g/dL — ABNORMAL HIGH (ref 12.0–15.0)
MCH: 29.8 pg (ref 26.0–34.0)
MCHC: 32.8 g/dL (ref 30.0–36.0)
MCV: 90.8 fL (ref 80.0–100.0)
Platelets: 273 10*3/uL (ref 150–400)
RBC: 5.13 MIL/uL — ABNORMAL HIGH (ref 3.87–5.11)
RDW: 14 % (ref 11.5–15.5)
WBC: 7.1 10*3/uL (ref 4.0–10.5)
nRBC: 0 % (ref 0.0–0.2)

## 2021-06-12 LAB — SALICYLATE LEVEL: Salicylate Lvl: <7 mg/dL — ABNORMAL LOW (ref 7.0–30.0)

## 2021-06-12 LAB — ETHANOL: Alcohol, Ethyl (B): 212 mg/dL — ABNORMAL HIGH (ref <10)

## 2021-06-12 LAB — I-STAT BETA HCG BLOOD, ED (MC, WL, AP ONLY): I-stat hCG, quantitative: <5 m[IU]/mL (ref <5)

## 2021-06-12 MED ORDER — LAMOTRIGINE 25 MG PO TABS
25.0000 mg | ORAL_TABLET | Freq: Every day | ORAL | Status: DC
  Administered 2021-06-13: 25 mg via ORAL
  Filled 2021-06-12: qty 1

## 2021-06-12 MED ORDER — SERTRALINE HCL 50 MG PO TABS
50.0000 mg | ORAL_TABLET | Freq: Every day | ORAL | Status: DC
  Administered 2021-06-13: 50 mg via ORAL
  Filled 2021-06-12: qty 1

## 2021-06-12 NOTE — ED Notes (Signed)
Labeled specimen cup provided for urine collection per MD order. [employer]

## 2021-06-12 NOTE — ED Provider Notes (Signed)
[facility]-EMERGENCY DEPT Provider Note   CSN: [id number] Arrival date & time: 06/12/21  2103     History Chief Complaint  Patient presents with   Suicidal    Kristina Owens is a 55 y.o. female.  Patient presents ER chief complaint of suicidal thoughts, admits alcohol use and smoking crack.  However patient now states that she has no suicidal thoughts and she is not sure why she mentioned it earlier.  She states she is concerned about her DVT in her left lower extremity, for which she has stopped taking her Coumadin.  She was diagnosed in June when she recently stopped her Coumadin because she ran out.  Complaining of shortness of breath as well denies any chest pain no fever no cough no vomiting no diarrhea reported.      Past Medical History:  Diagnosis Date   Anxiety    Depression    DVT (deep vein thrombosis) in pregnancy    GERD (gastroesophageal reflux disease)    Ovarian cyst     Patient Active Problem List   Diagnosis Date Noted   Pulmonary embolism (HCC) 01/08/2021   DVT (deep venous thrombosis) (HCC) 01/08/2021   Polysubstance (excluding opioids) dependence (HCC) 01/08/2021   Alcohol abuse with withdrawal (HCC) 01/08/2021   Bipolar disorder, in full remission, most recent episode depressed (HCC) 01/18/2020   Generalized anxiety disorder 01/18/2020   Alcohol use disorder, severe, in early remission, dependence (HCC) 01/18/2020   Cocaine use disorder, severe, in early remission (HCC) 01/18/2020   Suicidal ideation    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     Family History  Problem Relation Age of Onset   Asthma Mother    COPD Mother    Cancer Father        thyroid cancer    Social History   Tobacco Use   Smoking status: Every Day    Packs/day: 0.50    Years: 35.00    Pack years: 17.50    Types: Cigarettes   Smokeless tobacco: Never  Vaping Use   Vaping Use: Never used  Substance Use Topics   Alcohol use: Yes   Drug use: Yes    Types: "Crack" cocaine, Cocaine, Methamphetamines    Comment: last used 4/31/22    Home Medications Prior to Admission medications   Medication Sig Start Date End Date Taking? Authorizing Provider  acetaminophen (TYLENOL) 500 MG tablet Take 1,000 mg by mouth every 4 (four) hours as needed (for pain- ALTERNATING WITH IBUPROFEN).    [provider]  ARIPiprazole (ABILIFY) 10 MG tablet Take 1 tablet (10 mg total) by mouth daily. 01/19/21   Regalado, Belkys A, MD  busPIRone (BUSPAR) 10 MG tablet Take 1 tablet (10 mg total) by mouth 3 (three) times daily. Patient taking differently: Take 10 mg by mouth 3 (three) times daily  as needed (for anxiety). 01/18/21   Regalado, Belkys A, MD  lamoTRIgine (LAMICTAL) 25 MG tablet Take 1 tablet (25 mg total) by mouth daily. 01/19/21   Regalado, Belkys A, MD  sertraline (ZOLOFT) 50 MG tablet Take 1 tablet (50 mg total) by mouth daily. 01/18/21   Regalado, Belkys A, MD  warfarin (COUMADIN) 4 MG tablet Take 4 mg by mouth daily. 02/14/21   [provider]    Allergies    Patient has no known allergies.  Review of Systems   Review of Systems  Constitutional:  Negative for fever.  HENT:  Negative for ear pain.   Eyes:  Negative for pain.  Respiratory:  Negative for cough.   Cardiovascular:  Negative for chest pain.  Gastrointestinal:  Negative for abdominal pain.   Genitourinary:  Negative for flank pain.  Musculoskeletal:  Negative for back pain.  Skin:  Negative for rash.  Neurological:  Negative for headaches.   Physical Exam Updated Vital Signs BP 117/82 (BP Location: Left Arm)   Pulse 73   Temp 98.7 F (37.1 C) (Oral)   Resp 16   Ht 5\' 3"  (1.6 m)   Wt 68 kg   SpO2 97%   BMI 26.57 kg/m   Physical Exam Constitutional:      General: She is not in acute distress.    Appearance: Normal appearance.  HENT:     Head: Normocephalic.     Nose: Nose normal.  Eyes:     Extraocular Movements: Extraocular movements intact.  Cardiovascular:     Rate and Rhythm: Normal rate.  Pulmonary:     Effort: Pulmonary effort is normal.  Musculoskeletal:        General: Normal range of motion.     Cervical back: Normal range of motion.     Right lower leg: No edema.     Left lower leg: No edema.  Neurological:     General: No focal deficit present.     Mental Status: She is alert. Mental status is at baseline.    ED Results / Procedures / Treatments   Labs (all labs ordered are listed, but only abnormal results are displayed) Labs Reviewed  COMPREHENSIVE METABOLIC PANEL - Abnormal; Notable for the following components:      Result Value   Glucose, Bld 140 (*)    BUN <5 (*)    Calcium 8.7 (*)    All other components within normal limits  ETHANOL - Abnormal; Notable for the following components:   Alcohol, Ethyl (B) 212 (*)    All other components within normal limits  SALICYLATE LEVEL - Abnormal; Notable for the following components:   Salicylate Lvl <7.0 (*)    All other components within normal limits  ACETAMINOPHEN LEVEL - Abnormal; Notable for the following components:   Acetaminophen (Tylenol), Serum <10 (*)    All other components within normal limits  CBC - Abnormal; Notable for the following components:   RBC 5.13 (*)    Hemoglobin 15.3 (*)    HCT 46.6 (*)    All other components within normal limits  RAPID URINE DRUG SCREEN,  HOSP PERFORMED  D-DIMER, QUANTITATIVE  I-STAT BETA HCG BLOOD, ED ([facility], [facility], [facility] ONLY)    EKG None  Radiology DG Chest Port 1 View  Result Date: 06/12/2021 CLINICAL DATA:  Shortness of breath. EXAM: PORTABLE CHEST 1 VIEW COMPARISON:  03/06/2021. FINDINGS: The heart size and mediastinal contours are within normal limits. Both lungs are clear. No acute osseous  abnormality. IMPRESSION: No acute cardiopulmonary process. Electronically Signed   By: Thornell Sartorius M.D.   On: 06/12/2021 22:44    Procedures Procedures   Medications Ordered in ED Medications - No data to display  ED Course  I have reviewed the triage vital signs and the nursing notes.  Pertinent labs & imaging results that were available during my care of the patient were reviewed by me and considered in my medical decision making (see chart for details).    MDM Rules/Calculators/A&P                           No significant tenderness or abnormality or cellulitis seen in her legs.  Vital signs are within normal limits.  Ultrasound ordered for the morning as there is noted patient here today.  Patient placed in a psychiatric hold for psych evaluation in the ER.  Final Clinical Impression(s) / ED Diagnoses Final diagnoses:  Suicidal ideation    Rx / DC Orders ED Discharge Orders     None        Cheryll Cockayne, MD 06/12/21 2304

## 2021-06-12 NOTE — ED Triage Notes (Signed)
Patient has ETOH on board and smoking crack at 1830. Patient was picked up on the side of the road complaining of wanting to kill herself. Patient does not have a plan.

## 2021-06-13 ENCOUNTER — Ambulatory Visit (HOSPITAL_COMMUNITY)
Admission: EM | Admit: 2021-06-13 | Discharge: 2021-06-15 | Disposition: A | Discharge status: Home / Self Care | Payer: No Payment, Other | Attending: Family | Admitting: Family

## 2021-06-13 ENCOUNTER — Emergency Department (HOSPITAL_BASED_OUTPATIENT_CLINIC_OR_DEPARTMENT_OTHER): Payer: Self-pay

## 2021-06-13 DIAGNOSIS — R45851 Suicidal ideations: Secondary | ICD-10-CM | POA: Insufficient documentation

## 2021-06-13 DIAGNOSIS — M79604 Pain in right leg: Secondary | ICD-10-CM

## 2021-06-13 DIAGNOSIS — Z5329 Procedure and treatment not carried out because of patient's decision for other reasons: Secondary | ICD-10-CM | POA: Insufficient documentation

## 2021-06-13 DIAGNOSIS — D6851 Activated protein C resistance: Secondary | ICD-10-CM | POA: Insufficient documentation

## 2021-06-13 DIAGNOSIS — F1094 Alcohol use, unspecified with alcohol-induced mood disorder: Secondary | ICD-10-CM

## 2021-06-13 DIAGNOSIS — M79605 Pain in left leg: Secondary | ICD-10-CM

## 2021-06-13 DIAGNOSIS — F1999 Other psychoactive substance use, unspecified with unspecified psychoactive substance-induced disorder: Secondary | ICD-10-CM

## 2021-06-13 DIAGNOSIS — F1014 Alcohol abuse with alcohol-induced mood disorder: Secondary | ICD-10-CM | POA: Insufficient documentation

## 2021-06-13 DIAGNOSIS — F32A Depression, unspecified: Secondary | ICD-10-CM | POA: Insufficient documentation

## 2021-06-13 DIAGNOSIS — F10139 Alcohol abuse with withdrawal, unspecified: Secondary | ICD-10-CM | POA: Insufficient documentation

## 2021-06-13 DIAGNOSIS — T45516A Underdosing of anticoagulants, initial encounter: Secondary | ICD-10-CM | POA: Insufficient documentation

## 2021-06-13 DIAGNOSIS — F17210 Nicotine dependence, cigarettes, uncomplicated: Secondary | ICD-10-CM | POA: Insufficient documentation

## 2021-06-13 LAB — D-DIMER, QUANTITATIVE: D-Dimer, Quant: <0.27 ug/mL-FEU (ref 0.00–0.50)

## 2021-06-13 LAB — PROTIME-INR
INR: 1 (ref 0.8–1.2)
Prothrombin Time: 13.2 seconds (ref 11.4–15.2)

## 2021-06-13 LAB — RAPID URINE DRUG SCREEN, HOSP PERFORMED
Amphetamines: POSITIVE — AB
Barbiturates: NOT DETECTED
Benzodiazepines: NOT DETECTED
Cocaine: POSITIVE — AB
Opiates: NOT DETECTED
Tetrahydrocannabinol: POSITIVE — AB

## 2021-06-13 LAB — RESP PANEL BY RT-PCR (FLU A&B, COVID) ARPGX2
Influenza A by PCR: NEGATIVE
Influenza B by PCR: NEGATIVE
SARS Coronavirus 2 by RT PCR: NEGATIVE

## 2021-06-13 MED ORDER — THIAMINE HCL 100 MG/ML IJ SOLN: 100.0000 mg | Freq: Every day | INTRAMUSCULAR | Status: DC

## 2021-06-13 MED ORDER — THIAMINE HCL 100 MG/ML IJ SOLN
100.0000 mg | Freq: Once | INTRAMUSCULAR | Status: DC
  Filled 2021-06-13 (×2): qty 2

## 2021-06-13 MED ORDER — ACETAMINOPHEN 325 MG PO TABS: 650.0000 mg | ORAL_TABLET | ORAL | Status: DC | PRN

## 2021-06-13 MED ORDER — ENOXAPARIN SODIUM 80 MG/0.8ML IJ SOSY
70.0000 mg | PREFILLED_SYRINGE | Freq: Two times a day (BID) | INTRAMUSCULAR | Status: DC
  Administered 2021-06-13: 70 mg via SUBCUTANEOUS
  Filled 2021-06-13 (×3): qty 0.7

## 2021-06-13 MED ORDER — LORAZEPAM 2 MG/ML IJ SOLN: 0.0000 mg | Freq: Four times a day (QID) | INTRAMUSCULAR | Status: DC

## 2021-06-13 MED ORDER — WARFARIN SODIUM 5 MG PO TABS
5.0000 mg | ORAL_TABLET | Freq: Once | ORAL | Status: AC
  Administered 2021-06-13: 5 mg via ORAL
  Filled 2021-06-13: qty 1

## 2021-06-13 MED ORDER — NICOTINE 21 MG/24HR TD PT24
21.0000 mg | MEDICATED_PATCH | Freq: Every day | TRANSDERMAL | Status: DC
  Administered 2021-06-13: 21 mg via TRANSDERMAL
  Filled 2021-06-13: qty 1

## 2021-06-13 MED ORDER — ADULT MULTIVITAMIN W/MINERALS CH
1.0000 | ORAL_TABLET | Freq: Every day | ORAL | Status: DC
  Administered 2021-06-13 – 2021-06-14 (×2): 1 via ORAL
  Filled 2021-06-13 (×2): qty 1

## 2021-06-13 MED ORDER — LORAZEPAM 2 MG/ML IJ SOLN: 0.0000 mg | Freq: Two times a day (BID) | INTRAMUSCULAR | Status: DC

## 2021-06-13 MED ORDER — ENOXAPARIN SODIUM 80 MG/0.8ML IJ SOSY
70.0000 mg | PREFILLED_SYRINGE | Freq: Two times a day (BID) | INTRAMUSCULAR | Status: DC
  Administered 2021-06-13 – 2021-06-15 (×4): 70 mg via SUBCUTANEOUS
  Filled 2021-06-13 (×5): qty 0.8

## 2021-06-13 MED ORDER — LORAZEPAM 1 MG PO TABS: 0.0000 mg | ORAL_TABLET | Freq: Two times a day (BID) | ORAL | Status: DC

## 2021-06-13 MED ORDER — LORAZEPAM 1 MG PO TABS
1.0000 mg | ORAL_TABLET | Freq: Four times a day (QID) | ORAL | Status: DC | PRN
  Administered 2021-06-13 – 2021-06-14 (×2): 1 mg via ORAL
  Filled 2021-06-13 (×2): qty 1

## 2021-06-13 MED ORDER — WARFARIN - PHARMACIST DOSING INPATIENT: Freq: Every day | Status: DC

## 2021-06-13 MED ORDER — WARFARIN - PHARMACIST DOSING INPATIENT
Freq: Every day | Status: DC
Start: 2021-06-14 — End: 2021-06-15
  Filled 2021-06-13: qty 1

## 2021-06-13 MED ORDER — LOPERAMIDE HCL 2 MG PO CAPS: 2.0000 mg | ORAL_CAPSULE | ORAL | Status: DC | PRN

## 2021-06-13 MED ORDER — THIAMINE HCL 100 MG PO TABS
100.0000 mg | ORAL_TABLET | Freq: Every day | ORAL | Status: DC
  Administered 2021-06-14: 100 mg via ORAL
  Filled 2021-06-13: qty 1

## 2021-06-13 MED ORDER — ONDANSETRON 4 MG PO TBDP
4.0000 mg | ORAL_TABLET | Freq: Four times a day (QID) | ORAL | Status: DC | PRN
  Administered 2021-06-15: 4 mg via ORAL
  Filled 2021-06-13: qty 1

## 2021-06-13 MED ORDER — LORAZEPAM 1 MG PO TABS
0.0000 mg | ORAL_TABLET | Freq: Four times a day (QID) | ORAL | Status: DC
  Administered 2021-06-13: 2 mg via ORAL
  Administered 2021-06-13: 1 mg via ORAL
  Filled 2021-06-13: qty 1
  Filled 2021-06-13: qty 2

## 2021-06-13 MED ORDER — THIAMINE HCL 100 MG PO TABS
100.0000 mg | ORAL_TABLET | Freq: Every day | ORAL | Status: DC
  Administered 2021-06-13: 100 mg via ORAL
  Filled 2021-06-13: qty 1

## 2021-06-13 NOTE — ED Notes (Signed)
Attempted to call report. Nurse states she needs about 30 minutes before taking report.

## 2021-06-13 NOTE — ED Provider Notes (Signed)
Preliminary results for patient's DVT ultrasound show a saphenous clot of indeterminate age.  However this is improved from when she was originally diagnosed with a DVT/PE.  She endorses some cough and dyspnea for several weeks but no chest pain.  With her D-dimer being negative, vitals being negative, and this DVT probably not being acute, I have pretty low suspicion for PE.  I think we can just restart her anticoagulation, which needs to be warfarin based on hematology notes.  Will order Lovenox and Coumadin and will need bridging.  She states she basically stopped taking Coumadin a couple months ago due to her relapsing on the alcohol and drugs.   Kristina Loveless, MD 06/13/21 [phone]

## 2021-06-13 NOTE — Consult Note (Signed)
[facility] Consult   Reason for Consult:  Suicidal ideations Referring Physician:  Dr. 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 female patient admitted with complaints of suicidal thoughts, admits to alcohol use, crack cocaine and methamphetamine use.  She reports most recently using altering substances yesterday.  She presents today with request of help for detox.  Patient reports drinking about a pint to 1/5 of alcohol a day.  She also reports using crack cocaine and methamphetamine, intermittently"Whenever I can get my hands on it."  She reports a previous psychiatric diagnosis of anxiety, depression, ADHD, PTSD.  She reports symptoms of anhedonia, increased anxiety, depression, hopelessness, worthlessness, and guilty as it pertains to her substance use.  She denies any symptoms of psychosis or mania.  Currently not on any medication.  She was previously prescribed most recently as of 2 months ago Zoloft, BuSpar, Abilify, and Lamictal.  She states these medications were working for her, however she said that when she relapsed.  She is currently receiving outpatient psychiatric services at the community clinic and [facility].  She reports history of multiple inpatient hospitalizations, however been unable to provide a most recent timeline for such.   Kristina Owens is alert and oriented, calm and cooperative, very pleasant and easily aroused.  She presented to the ED today with complaints of alcohol and, drugs, and suicidal ideation.  Patient reports she relapsed 2 months ago, and notes  that she does not like her life.  She denies any current suicidal thoughts, more so just great resentment towards herself and guilty.  Patient does not appear to have many ED visits since May 2022 in which she presented for alcohol use at that time as well.  Patient reports ongoing frustration about lack of resources, decreased finances, unemployment, homelessness due to her drug and alcohol use.  There appears to be some family stressors and strain due to his substance use.  Patient denied any withdrawal symptoms at this time, denies any history of DTs, seizures, and or hallucinations.  Patient currently denies any previous suicidal attempts, any previous plans to end her life.  She denies any suicidal ideations at this time.     Past Psychiatric History: Chart reviewshows patient was last admitted to [facility] polysubstance abuse with suicidal ideation in April 2021.  [facility] [place] in 2015, [facility].  She was previously prescribed Prozac and Abilify.  Risk to Self:   Risk to Others:   Prior Inpatient Therapy:   Prior Outpatient Therapy:    Past Medical History:  Past Medical History:  Diagnosis Date   Anxiety    Depression    DVT (deep vein thrombosis) in pregnancy    GERD (gastroesophageal reflux disease)    Ovarian cyst     Past Surgical History:  Procedure Laterality Date   APPENDECTOMY  age 29   IR PTA VENOUS EXCEPT DIALYSIS CIRCUIT  01/16/2021   IR RADIOLOGIST EVAL & MGMT  03/19/2021   IR THROMBECT VENO MECH MOD SED  01/16/2021   IR US GUIDE VASC ACCESS LEFT  01/16/2021   IR VENO/EXT/UNI LEFT  01/16/2021   IR VENOCAVAGRAM IVC  01/16/2021   TONSILLECTOMY Bilateral age 82   Family History:  Family History  Problem Relation Age of Onset   Asthma Mother    COPD Mother    Cancer Father        thyroid cancer   Family Psychiatric  History: Reports her sister has a diagnosis of depression, and maternal grandmother diagnosis of depression.  No known suicide  attempts or suicide completions in her family to date. Social History:  Social History   Substance and Sexual Activity  Alcohol Use Yes     Social History   Substance and Sexual Activity  Drug Use Yes   Types: "Crack" cocaine, Cocaine, Methamphetamines   Comment: last used 4/31/22    Social History   Socioeconomic History   Marital status: Divorced    Spouse name: Not on file   Number of children: Not on file   Years of education: Not on file   Highest education level: Not on file  Occupational History   Not on file  Tobacco Use   Smoking status: Every Day    Packs/day: 0.50    Years: 35.00    Pack years: 17.50    Types: Cigarettes   Smokeless tobacco: Never  Vaping Use   Vaping Use: Never used  Substance and Sexual Activity   Alcohol use: Yes   Drug use: Yes    Types: "Crack" cocaine, Cocaine, Methamphetamines    Comment: last used 4/31/22   Sexual activity: Not on file  Other Topics Concern   Not on file  Social History Narrative   Not on file   Social Determinants of Health   Financial Resource Strain: Not on file  Food Insecurity: Not on file  Transportation Needs: Not on file  Physical Activity: Not on file  Stress: Not on file  Social Connections: Not on file   Additional Social History:    Allergies:  No Known Allergies  Labs:  Results for orders placed or performed during the hospital encounter of 06/12/21 (from the past 48 hour(s))  Comprehensive metabolic panel     Status: Abnormal   Collection Time: 06/12/21  9:15 PM  Result Value Ref Range   Sodium 138 135 - 145 mmol/L   Potassium 3.9 3.5 - 5.1 mmol/L   Chloride 103 98 - 111 mmol/L   CO2 27 22 - 32 mmol/L   Glucose, Bld 140 (H) 70 - 99 mg/dL    Comment: Glucose reference range applies only to samples taken after fasting for at least 8 hours.   BUN <5 (L) 6 - 20 mg/dL   Creatinine, Ser 4.70 0.44 - 1.00 mg/dL   Calcium 8.7 (L) 8.9 - 10.3 mg/dL   Total Protein 7.4 6.5 - 8.1 g/dL    Albumin 4.2 3.5 - 5.0 g/dL   AST 25 15 - 41 U/L   ALT 23 0 - 44 U/L   Alkaline Phosphatase 114 38 - 126 U/L   Total Bilirubin 0.6 0.3 - 1.2 mg/dL   GFR, Estimated >96 >28 mL/min    Comment: (NOTE) Calculated using the CKD-EPI Creatinine Equation (2021)    Anion gap 8 5 - 15    Comment: Performed at [facility], [address] [place], [place] [address]  Ethanol     Status: Abnormal   Collection Time: 06/12/21  9:15 PM  Result Value Ref Range   Alcohol, Ethyl (B) 212 (H) <10 mg/dL    Comment: (NOTE) Lowest detectable limit for serum alcohol is  10 mg/dL.  For medical purposes only. Performed at [facility], [place] [address], [place], [place] [id number]   Salicylate level     Status: Abnormal   Collection Time: 06/12/21  9:15 PM  Result Value Ref Range   Salicylate Lvl [id number] (L) 7.0 - 30.0 mg/dL    Comment: Performed at [facility], [place] [address], [place], [place] [address]  Acetaminophen level     Status: Abnormal   Collection Time: 06/12/21  9:15 PM  Result Value Ref Range   Acetaminophen (Tylenol), Serum <10 (L) 10 - 30 ug/mL    Comment: (NOTE) Therapeutic concentrations vary significantly. A range of 10-30 ug/mL  may be an effective concentration for many patients. However, some  are best treated at concentrations outside of this range. Acetaminophen concentrations >150 ug/mL at 4 hours after ingestion  and >50 ug/mL at 12 hours after ingestion are often associated with  toxic reactions.  Performed at [facility], [place] [address] [place], [place] [address]   cbc     Status: Abnormal   Collection Time: 06/12/21  9:15 PM  Result Value Ref Range   WBC 7.1 4.0 - 10.5 K/uL   RBC 5.13 (H) 3.87 - 5.11 MIL/uL   Hemoglobin 15.3 (H) 12.0 - 15.0 g/dL   HCT 46.6 (H) 36.0 - 46.0 %   MCV 90.8 80.0 - 100.0 fL   MCH 29.8 26.0 - 34.0 pg   MCHC 32.8 30.0 - 36.0 g/dL   RDW 14.0 11.5 - 15.5 %   Platelets  273 150 - 400 K/uL   nRBC 0.0 0.0 - 0.2 %    Comment: Performed at [facility], [place] [address], [place], [place] [address]  I-Stat beta hCG blood, ED     Status: None   Collection Time: 06/12/21  9:20 PM  Result Value Ref Range   I-stat hCG, quantitative <5.0 <5 mIU/mL   Comment 3            Comment:   GEST. AGE      CONC.  (mIU/mL)   <=1 WEEK        5 - 50     2 WEEKS       50 - 500     3 WEEKS       100 - 10,000     4 WEEKS     1,000 - 30,000        FEMALE AND NON-PREGNANT FEMALE:     LESS THAN 5 mIU/mL   D-dimer, quantitative     Status: None   Collection Time: 06/13/21  1:55 AM  Result Value Ref Range   D-Dimer, Quant <0.27 0.00 - 0.50 ug/mL-FEU    Comment: (NOTE) At the manufacturer cut-off value of 0.5 g/mL FEU, this assay has a negative predictive value of 95-100%.This assay is intended for use in conjunction with a clinical pretest probability (PTP) assessment model to exclude pulmonary embolism (PE) and deep venous thrombosis (DVT) in outpatients suspected of PE or DVT. Results should be correlated with clinical presentation. Performed at [facility], [place] [address], [place], [place] [address]   Rapid urine drug screen (hospital performed)     Status: Abnormal   Collection Time: 06/13/21  7:14 AM  Result Value Ref Range   Opiates NONE DETECTED NONE DETECTED   Cocaine POSITIVE (A) NONE DETECTED   Benzodiazepines NONE DETECTED NONE DETECTED   Amphetamines POSITIVE (A) NONE DETECTED  Tetrahydrocannabinol POSITIVE (A) NONE DETECTED   Barbiturates NONE DETECTED NONE DETECTED    Comment: (NOTE) DRUG SCREEN FOR MEDICAL PURPOSES ONLY.  IF CONFIRMATION IS NEEDED FOR ANY PURPOSE, NOTIFY LAB WITHIN 5 DAYS.  LOWEST DETECTABLE LIMITS FOR URINE DRUG SCREEN Drug Class                     Cutoff (ng/mL) Amphetamine and metabolites    1000 Barbiturate and metabolites    200 Benzodiazepine                 200 Tricyclics and  metabolites     300 Opiates and metabolites        300 Cocaine and metabolites        300 THC                            50 Performed at [facility], [address] [place], [place] [address]   Resp Panel by RT-PCR (Flu A&B, Covid) Nasopharyngeal Swab     Status: None   Collection Time: 06/13/21  9:07 AM   Specimen: Nasopharyngeal Swab; Nasopharyngeal(NP) swabs in vial transport medium  Result Value Ref Range   SARS Coronavirus 2 by RT PCR NEGATIVE NEGATIVE    Comment: (NOTE) SARS-CoV-2 target nucleic acids are NOT DETECTED.  The SARS-CoV-2 RNA is generally detectable in upper respiratory specimens during the acute phase of infection. The lowest concentration of SARS-CoV-2 viral copies this assay can detect is 138 copies/mL. A negative result does not preclude SARS-Cov-2 infection and should not be used as the sole basis for treatment or other patient management decisions. A negative result may occur with  improper specimen collection/handling, submission of specimen other than nasopharyngeal swab, presence of viral mutation(s) within the areas targeted by this assay, and inadequate number of viral copies(<138 copies/mL). A negative result must be combined with clinical observations, patient history, and epidemiological information. The expected result is Negative.  Fact Sheet for Patients:  BloggerCourse.com  Fact Sheet for Healthcare Providers:  SeriousBroker.it  This test is no t yet approved or cleared by the [place] FDA and  has been authorized for detection and/or diagnosis of SARS-CoV-2 by FDA under an Emergency Use Authorization (EUA). This EUA will remain  in effect (meaning this test can be used) for the duration of the COVID-19 declaration under Section 564(b)(1) of the Act, 21 U.S.C.section 360bbb-3(b)(1), unless the authorization is terminated  or revoked sooner.       Influenza A by  PCR NEGATIVE NEGATIVE   Influenza B by PCR NEGATIVE NEGATIVE    Comment: (NOTE) The Xpert Xpress SARS-CoV-2/FLU/RSV plus assay is intended as an aid in the diagnosis of influenza from Nasopharyngeal swab specimens and should not be used as a sole basis for treatment. Nasal washings and aspirates are unacceptable for Xpert Xpress SARS-CoV-2/FLU/RSV testing.  Fact Sheet for Patients: BloggerCourse.com  Fact Sheet for Healthcare Providers: SeriousBroker.it  This test is not yet approved or cleared by the [place] FDA and has been authorized for detection and/or diagnosis of SARS-CoV-2 by FDA under an Emergency Use Authorization (EUA). This EUA will remain in effect (meaning this test can be used) for the duration of the COVID-19 declaration under Section 564(b)(1) of the Act, 21 U.S.C. section 360bbb-3(b)(1), unless the authorization is terminated or revoked.  Performed at [facility], [address], [place], [place] [address]   Protime-INR  Status: None   Collection Time: 06/13/21  9:47 AM  Result Value Ref Range   Prothrombin Time 13.2 11.4 - 15.2 seconds   INR 1.0 0.8 - 1.2    Comment: (NOTE) INR goal varies based on device and disease states. Performed at [facility], [place] [address], [place], [place] [address]     Current Facility-Administered Medications  Medication Dose Route Frequency Provider Last Rate Last Admin   acetaminophen (TYLENOL) tablet 650 mg  650 mg Oral Q4H PRN Sherwood Gambler, MD       enoxaparin (LOVENOX) injection 70 mg  70 mg Subcutaneous Q12H Glogovac, Nikola, RPH       lamoTRIgine (LAMICTAL) tablet 25 mg  25 mg Oral Daily Luna Fuse, MD   25 mg at 06/13/21 0941   LORazepam (ATIVAN) injection 0-4 mg  0-4 mg Intravenous Q6H Sherwood Gambler, MD       Or   LORazepam (ATIVAN) tablet 0-4 mg  0-4 mg Oral Q6H Sherwood Gambler, MD   2 mg at 06/13/21 0940    [START ON 06/15/2021] LORazepam (ATIVAN) injection 0-4 mg  0-4 mg Intravenous Q12H Sherwood Gambler, MD       Or   Derrill Memo ON 06/15/2021] LORazepam (ATIVAN) tablet 0-4 mg  0-4 mg Oral Q12H Sherwood Gambler, MD       nicotine (NICODERM CQ - dosed in mg/24 hours) patch 21 mg  21 mg Transdermal Daily Sherwood Gambler, MD   21 mg at 06/13/21 0939   sertraline (ZOLOFT) tablet 50 mg  50 mg Oral Daily Luna Fuse, MD   50 mg at 06/13/21 0941   thiamine tablet 100 mg  100 mg Oral Daily Sherwood Gambler, MD   100 mg at 06/13/21 [id number]   Or   thiamine (B-1) injection 100 mg  100 mg Intravenous Daily Sherwood Gambler, MD       warfarin (COUMADIN) tablet 5 mg  5 mg Oral ONCE-1600 Glogovac, Nikola, RPH       Warfarin - Pharmacist Dosing Inpatient   Does not apply q1600 Royetta Asal, [facility]       Current Outpatient Medications  Medication Sig Dispense Refill   acetaminophen (TYLENOL) 500 MG tablet Take 1,000 mg by mouth every 4 (four) hours as needed (for pain- ALTERNATING WITH IBUPROFEN).     ARIPiprazole (ABILIFY) 10 MG tablet Take 1 tablet (10 mg total) by mouth daily. 30 tablet 0   busPIRone (BUSPAR) 10 MG tablet Take 1 tablet (10 mg total) by mouth 3 (three) times daily. (Patient taking differently: Take 10 mg by mouth 3 (three) times daily as needed (for anxiety).) 30 tablet 1   lamoTRIgine (LAMICTAL) 25 MG tablet Take 1 tablet (25 mg total) by mouth daily. 30 tablet 0   sertraline (ZOLOFT) 50 MG tablet Take 1 tablet (50 mg total) by mouth daily. 30 tablet 2   warfarin (COUMADIN) 4 MG tablet Take 4 mg by mouth daily.      Musculoskeletal: Strength & Muscle Tone: within normal limits Gait & Station: normal Patient leans: N/A            Psychiatric Specialty Exam:  Presentation  General Appearance: Appropriate for Environment; Casual  Eye Contact:Fair  Speech:Clear and Coherent; Normal Rate  Speech Volume:Normal  Handedness:Right   Mood and Affect   Mood:Anxious  Affect:Appropriate; Congruent   Thought Process  Thought Processes:Coherent; Linear  Descriptions of Associations:Intact  Orientation:Full (Time, Place and Person)  Thought Content:Logical  History of Schizophrenia/Schizoaffective disorder:No data  recorded Duration of Psychotic Symptoms:No data recorded Hallucinations:Hallucinations: None  Ideas of Reference:None  Suicidal Thoughts:Suicidal Thoughts: No  Homicidal Thoughts:Homicidal Thoughts: No   Sensorium  Memory:Immediate Good; Recent Fair; Remote Fair  Judgment:Impaired  Insight:Poor; Lacking   Executive Functions  Concentration:Fair  Attention Span:Fair  [place]   Psychomotor Activity  Psychomotor Activity:Psychomotor Activity: Normal   Assets  Assets:Communication Skills; Housing; Catering manager; Desire for Improvement; Leisure Time; Physical Health; Social Support   Sleep  Sleep:Sleep: Fair   Physical Exam: Physical Exam Vitals and nursing note reviewed.  Constitutional:      General: She is sleeping.     Appearance: She is normal weight.  HENT:     Head: Normocephalic.     Mouth/Throat:     Mouth: Mucous membranes are moist.  Eyes:     Conjunctiva/sclera: Conjunctivae normal.     Pupils: Pupils are equal, round, and reactive to light.  Skin:    General: Skin is warm and dry.  Neurological:     General: No focal deficit present.     Mental Status: She is oriented to person, place, and time and easily aroused. Mental status is at baseline.  Psychiatric:        Mood and Affect: Mood normal.        Behavior: Behavior normal. Behavior is cooperative.        Thought Content: Thought content normal.        Judgment: Judgment normal.   ROS Blood pressure 117/76, pulse 70, temperature 98 F (36.7 C), temperature source Oral, resp. rate 16, height 5\' 3"  (1.6 m), weight 68 kg, SpO2 93 %. Body mass index is 26.57  kg/m.  Treatment Plan Summary: Plan we will obtain medical clearance, and then assist patient to [facility] for detox -Will initiate CIWA protocol. -Will initiate Ativan detox protocol.     Labs : Blood alcohol level 212 on admission, urine drug screen positive for amphetamines, cocaine, THC, QTC of 441.  -We will also place TOC consult to begin the referral process for substance abuse facilities such as [facility], [facility], [facility], [facility], and [facility]. disposition: No evidence of imminent risk to self or others at present.   Patient does not meet criteria for psychiatric inpatient admission. Will have patient reviewed for admission to [facility], once medically stable. She doesn't meet criteria for inpatient at this time, and she is interested in detox and short term residential substance abuse.   Suella Broad, FNP 06/13/2021 12:13 PM

## 2021-06-13 NOTE — Progress Notes (Signed)
ANTICOAGULATION CONSULT NOTE - Consult  Pharmacy Consult for enoxaparin and warfarin Indication: DVT  No Known Allergies  Patient Measurements: Height: 5\' 3"  (160 cm) Weight: 68 kg (150 lb) IBW/kg (Calculated) : 52.4 Heparin Dosing Weight:   Vital Signs: Temp: 98 F (36.7 C) (11/10 0739) Temp Source: Oral (11/10 0739) BP: 117/76 (11/10 0739) Pulse Rate: 70 (11/10 0739)  Labs: Recent Labs    06/12/21 2115 06/13/21 0947  HGB 15.3*  --   HCT 46.6*  --   PLT 273  --   LABPROT  --  13.2  INR  --  1.0  CREATININE 0.53  --     Estimated Creatinine Clearance: 75.2 mL/min (by C-G formula based on SCr of 0.53 mg/dL).   Medical History: Past Medical History:  Diagnosis Date   Anxiety    Depression    DVT (deep vein thrombosis) in pregnancy    GERD (gastroesophageal reflux disease)    Ovarian cyst     Medications:  (Not in a hospital admission)   Assessment: Pharmacy is consulted to dose enoxaparin and warfarin in 55 yo female with PMH of DVT. Pt was to be on warfarin but stopped taking recently. Pt was found to have factor V Leiden deficiency.   PMH includes an acute thrombus involving the saphenofemoral junction and left popliteal vein.  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  Of note, back in June, she had a CT angiogram done.  This showed multiple bilateral pulmonary emboli including saddle embolus at the bifurcation.  There was some right heart strain.  She was initially put on Lovenox and then transitioned over to Eliquis.  However, she ultimately had to go to interventional radiology and she had a mechanical thrombectomy with TPA.  This was done on 01/16/2021. Was then transitioned to warfarin.  Per Warfarin clinic note on 03/22/2021, Pt regimen was due to Warfarin 4mg  daily except 2 mg on Tuesdays and Thursdays.      Today,06/13/21 INR 1.0, confirming that pt has not been taking  Hgb 15.3 (  11/9)  Plt 273  SCr < 1      Goal of Therapy:  Goal : Per last warfarin clinic note on 03/22/2021, target INR was 2.5-3.0  Monitor platelets by anticoagulation protocol: Yes   Plan:  Enoxaparin 70 mg SQ q12h.  Warfarin 5 mg PO x 1  Daily INR Daily CBC  Monitor for signs and symptoms of bleeding   08-31-1970, PharmD, BCPS 06/13/2021 11:50 AM

## 2021-06-13 NOTE — ED Notes (Signed)
Report received from Jessica Pray, RN.  

## 2021-06-13 NOTE — ED Notes (Signed)
Pt  ambulatory to and from bathroom without difficulty.

## 2021-06-13 NOTE — ED Notes (Signed)
Patient did not attend group due to her just arriving on to the unit, and getting adjusted.

## 2021-06-13 NOTE — ED Notes (Signed)
Patient admitted to [facility].  She is calm and cooperative appears to be in ETOH wdl with tongue vesiculation and mild tremors. Diaphoretic.   Patient is disheveled.  She was given scrubs and shown around unit quickly.  Patient has DVT/PE in LLE.  She has been ordered Lovenox which is in the med room for the A.M.  Will continue to monitor and provide safe supportive environmnet

## 2021-06-13 NOTE — Progress Notes (Signed)
Bilateral lower extremity venous duplex has been completed. Preliminary results can be found in CV Proc through chart review.  Results were given to Dr. Criss Alvine.  06/13/21 9:36 AM Olen Cordial RVT

## 2021-06-13 NOTE — ED Notes (Signed)
Pt resting quietly in ED hall stretcher. Pt in direct line of sight of nurses station. Sitter at the bedside. 1:1.

## 2021-06-13 NOTE — ED Notes (Signed)
Safe transport called for transport to [facility].

## 2021-06-13 NOTE — ED Notes (Addendum)
Pt resting in bed. A&O x4, anxious but cooperative. Pt reports mild tremors and diaphoresis. Pt denies current SI/HI/AVH. Crackers and water given per pt request. No signs of acute distress noted. Will continue to monitor for safety.

## 2021-06-13 NOTE — ED Notes (Signed)
Pt asleep in bed. Respirations even and unlabored. Will continue to monitor for safety. ?

## 2021-06-14 ENCOUNTER — Encounter (HOSPITAL_COMMUNITY): Payer: Self-pay | Admitting: Behavioral Health

## 2021-06-14 MED ORDER — LORAZEPAM 1 MG PO TABS: 1.0000 mg | ORAL_TABLET | Freq: Two times a day (BID) | ORAL | Status: DC

## 2021-06-14 MED ORDER — LORAZEPAM 1 MG PO TABS
1.0000 mg | ORAL_TABLET | Freq: Three times a day (TID) | ORAL | Status: DC
  Filled 2021-06-14: qty 1

## 2021-06-14 MED ORDER — LOPERAMIDE HCL 2 MG PO CAPS: 2.0000 mg | ORAL_CAPSULE | ORAL | Status: DC | PRN

## 2021-06-14 MED ORDER — LORAZEPAM 1 MG PO TABS
1.0000 mg | ORAL_TABLET | Freq: Four times a day (QID) | ORAL | Status: AC
  Administered 2021-06-14 – 2021-06-15 (×4): 1 mg via ORAL
  Filled 2021-06-14 (×3): qty 1

## 2021-06-14 MED ORDER — LORAZEPAM 1 MG PO TABS: 1.0000 mg | ORAL_TABLET | Freq: Every day | ORAL | Status: DC

## 2021-06-14 MED ORDER — THIAMINE HCL 100 MG/ML IJ SOLN: 100.0000 mg | Freq: Once | INTRAMUSCULAR | Status: DC

## 2021-06-14 MED ORDER — LORAZEPAM 1 MG PO TABS: 1.0000 mg | ORAL_TABLET | Freq: Four times a day (QID) | ORAL | Status: DC | PRN

## 2021-06-14 MED ORDER — ONDANSETRON 4 MG PO TBDP: 4.0000 mg | ORAL_TABLET | Freq: Four times a day (QID) | ORAL | Status: DC | PRN

## 2021-06-14 MED ORDER — HYDROXYZINE HCL 25 MG PO TABS
25.0000 mg | ORAL_TABLET | Freq: Four times a day (QID) | ORAL | Status: DC | PRN
  Filled 2021-06-14: qty 1

## 2021-06-14 MED ORDER — THIAMINE HCL 100 MG PO TABS
100.0000 mg | ORAL_TABLET | Freq: Every day | ORAL | Status: DC
  Administered 2021-06-15: 100 mg via ORAL
  Filled 2021-06-14 (×2): qty 1

## 2021-06-14 MED ORDER — WARFARIN SODIUM 5 MG PO TABS
5.0000 mg | ORAL_TABLET | Freq: Once | ORAL | Status: AC
  Administered 2021-06-14: 5 mg via ORAL
  Filled 2021-06-14: qty 1

## 2021-06-14 MED ORDER — ADULT MULTIVITAMIN W/MINERALS CH
1.0000 | ORAL_TABLET | Freq: Every day | ORAL | Status: DC
  Administered 2021-06-15: 1 via ORAL
  Filled 2021-06-14 (×2): qty 1

## 2021-06-14 MED ORDER — ACETAMINOPHEN 325 MG PO TABS: 650.0000 mg | ORAL_TABLET | Freq: Four times a day (QID) | ORAL | Status: DC | PRN

## 2021-06-14 NOTE — Progress Notes (Signed)
Patient reporting not feeling well.  Stated she's shaky, sweaty, vision blurred, and very anxious.  On Ativan taper.  Continue to monitor for safety.

## 2021-06-14 NOTE — Progress Notes (Signed)
Lab drawn and [facility] called.
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
## 2021-06-14 NOTE — ED Provider Notes (Addendum)
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 See Additional Clinical Information and Comments. Additional Clinical  Information: Venous thromboembolism is a multifactorial disease influenced by genetic, environmental, and circumstantial risk factors. The c.1601G>A (p. Arg534Gln) variant in the F5 gene, commonly referred to as Factor V Leiden, is a genetic risk factor for venous thromboembolism. Heterozygous carriers of this variant have a 6- to 8- fold increased risk for venous thromboembolism. Individuals homozygous for this variant (ie, with a copy of the variant on each chromosome) have an approximately 80-fold increased risk for venous thromboembolism. Individuals who carry both a c.*97G>A variant in the F2 gene and Factor V Leiden have an approximately 20-fold increased risk for venous thromboembolism. Risks are likely to be even higher in more complex genoty                          pe combinations involving the F2 c.*97G>A variant and Factor V Leiden (PMID: [id number]). Additional risk factors include but are not limited to: deficiency of protein C, protein S, or antithrombin III, age, female sex, personal or family history of deep vein thromboembolism, smoking, surgery, prolonged immobilization, malignant neoplasm, tamoxifen treatment, raloxifene treatment, oral contraceptive use, hormone replacement therapy, and pregnancy. Management of thrombotic risk and thrombotic events should follow established guidelines and fit the clinical circumstance. This result cannot predict the occurrence or recurrence of a thrombotic event. Comment: Genetic counseling is recommended to discuss the potential clinical implications of positive results, as well as recommendations for testing family members. Genetic Coordinators are available for health care providers to discuss results at [phone]). Test Details: Variant Analyzed: c.1601G>A (p. Arg534Gln), r                          eferred to as Factor V Leiden Methods/Limitations: DNA analysis of the F5 gene (NM_000130.5) was performed by  PCR amplification followed by restriction enzyme analysis. The diagnostic sensitivity is >99%. Results must be combined with clinical information for the most accurate interpretation. Molecular- based testing is highly accurate, but as in any laboratory test, diagnostic errors may occur. False positive or false negative results may occur for reasons that include genetic variants, blood transfusions, bone marrow transplantation, somatic or tissue-specific mosaicism, mislabeled samples, or erroneous representation of family relationships. This test was developed and its performance characteristics determined by [facility]. It has not been cleared or approved by the Food and Drug Administration. References: [facility], [facility]; ACMG Professional Practice and Guidelines Committee. Addendum: [employer] consensu                          s statement on factor V Leiden mutation testing. Genet Med. 2021 Mar 5. doi: 40.9735/[id number]- 01108-x. PMID: 34196222. Kristopher Oppenheim. Factor V Leiden Thrombophilia. 1999 May 14 [Updated 2018 Jan 4]. In: Tarri Glenn, Ardinger HH, Pagon RA, et al., editors. GeneReviews(R) [Internet]. [address] ([place]): [place], [place]; 1993-2021.  Available from: [email or web address] Terrilee Files, Carla Drape, Marin Shutter CS; ACMG Laboratory Quality Assurance Committee. Venous thromboembolism laboratory testing (factor V Leiden and factor II c.*97G>A), 2018 update: a technical standard of the [place] ([facility]). Genet Med. 2018 Dec;20(12):1489-1498. doi: 76.7341/[id number]-G. Epub 2018 Oct 5. PMID: [id number]. Ruben Reason, PhD, [facility] Jane Canary, PhD Earlean Polka, PhD, [facility] Fannie Knee, PhD, [facility] Threasa Alpha, PhD, [facility] W Gailen Shelter, PhD, [facility] Lubertha South, PhD, FACM                           Tonna Boehringer, PhD, [facility] Performed At: [facility] [address] [place], [place] [id number] Katina Degree MDPhD LG:[id number]    Beta-2 Glyco I IgG 03/21/2021 <9  0 - 20 GPI IgG units Final   Comment: (NOTE) The reference interval reflects a 3SD or 99th percentile interval, which is thought to represent a potentially clinically significant result in accordance with the International Consensus Statement on the classification criteria for definitive antiphospholipid syndrome (APS). J Thromb Haem 2006;4:295-306.    Beta-2-Glycoprotein I IgM 03/21/2021 <9  0 - 32 GPI IgM units Final   Comment: (NOTE) The reference interval reflects a 3SD or 99th percentile interval, which is thought to represent a potentially clinically significant result in accordance with the International Consensus Statement on the classification criteria for definitive antiphospholipid syndrome (APS). J Thromb Haem 2006;4:295-306. Performed At: [facility] [place], [place] [id number] Rush Farmer MD DJ:[id number]    Beta-2-Glycoprotein I IgA 03/21/2021 <9  0 - 25 GPI IgA units Final   Comment: (NOTE) The reference interval reflects a 3SD or 99th percentile interval, which is thought to represent a potentially clinically significant result in accordance with the International Consensus Statement on the classification criteria for definitive antiphospholipid syndrome (APS). J Thromb Haem 2006;4:295-306.    PTT Lupus Anticoagulant 03/21/2021 40.3  0.0 - 51.9 sec Final   DRVVT 03/21/2021 43.8  0.0 - 47.0 sec Final   Lupus Anticoag Interp 03/21/2021 Comment:   Corrected   Comment: (NOTE) No lupus anticoagulant was detected. Performed At: [facility] [place], [place] [id number] Rush Farmer MD OI:[id number]    Protein S Ag, Total 03/21/2021 43 (A)  60 - 150 % Final   Comment: (NOTE) This test was developed and its performance characteristics determined by  [facility]. It has not been cleared or approved by the Food and Drug Administration. Performed At: [facility] [place], [place] [id number] Rush Farmer MD QH:[id number]    Protein S Activity 03/21/2021 31 (A)  63 - 140 % Final   Comment: (NOTE) A deficiency of protein S (PS), either congenital or acquired, increases the risk of thromboembolism. PS activity levels may be falsely low in individuals with APCR/Factor V Leiden. Consider performing free protein S antigen in those with APCR/Factor V Leiden before making a diagnosis of protein S deficiency. Acquired PS deficiency is more common than congenital deficiency. PS values decrease with normal pregnancy, and are also dependent on age, sex and hormone status. PS values tend to be lower in a younger age group and lower in women than in men. Levels may be decreased in pre-menopausal women on oral contraceptive agents. Acquired deficiency can occur as a result of vitamin K deficiency or antagonism, severe hepatic disorders, (hepatitis, cirrhosis, etc.), nephrotic syndrome, inflammatory bowel disease, certain  chemotherapeutic agents, L-asparaginse therapy, sepsis, disseminated intravascular coagulation (DIC) and acute thrombosis. Levels may be decreased in                           patients with polycythemia vera, sickle cell disease and essential thrombocythemia. Repeat evaluation on a new plasma sample to confirm or refute this result should be considered, after ruling out acquired causes, depending on the clinical scenario. Performed At: [facility] [place], [place] [id number] Rush Farmer MD DP:[id number]    Protein C, Total 03/21/2021 52 (A)  60 - 150 % Final   Comment: (NOTE) A deficiency of protein C (PC), either congenital or acquired, increases the risk of thromboembolism. Congenital deficiencies of PC are very rare; acquired PC deficiency is much more common. PC levels can be  transiently diminished after an acute thrombotic event. Oral anticoagulant therapy with warfarin will lower PC levels as well as vitamin K deficiency. Acquired deficiency can also occur in individuals with disseminated intravascular coagulation (DIC), sepsis, severe liver disease, nephrotic syndrome, and in inflammatory bowel disease. Levels may be spuriously decreased in individuals with Factor V Leiden. Drug therapy with L-asparaginse, fluorouracil, methotrexate, cyclophosphamide or tamoxifen can also reduce PC levels. It has been suggested that repeat blood sampling and testing after ruling out acquired causes of deficiency should be performed before the patient is diagnosed with congenital Protein C deficiency. Performed At: [facility] [place], [place] [id number] Rush Farmer MD PY:[id number]    Protein C Activity 03/21/2021 70 (A)  73 - 180 % Final   Comment: (NOTE) A deficiency of protein C (PC), either congenital or acquired, increases the risk of thromboembolism. Acquired PC deficiency occurs more frequently than congenital deficiency. PC levels can be transiently diminished after a thrombotic event or surgery or in the presence of certain anticoagulants. Heparin, direct Xa inhibitor, or thrombotic inhibitor therapy does not alter PC levels physiologically and does not interfere with this assay because it is chromogenic and clot-based. Vitamin K antagonist therapy may decrease plasma levels of functional protein C (PC) as PC is a vitamin K- dependent protein. Vitamin K deficiency, due to dietary insufficiency or malabsorption will also lead to reduced PC levels. Acquired deficiency can be found in individuals with disseminated intravascular coagulation (DIC) and sepsis. Severe hepatic disorders (hepatitis, cirrhosis, etc.), nephrotic syndrome, malignancy and inflammatory bowel disease can lead to diminished PC levels. Drug                            therapy with L-asparaginse or fluorouracil can also reduce PC levels. Levels may be decreased in patients with polycythemia vera, sickle cell disease and essential thrombocythemia. Repeat evaluation on a new plasma sample to confirm or refute this result should be considered, after ruling out acquired causes, depending on the clinical scenario. Performed At: [facility] [place], [place] [id number] Rush Farmer MD JA:[id number]    AntiThromb III Func 03/21/2021 134 (A)  75 - 120 % Final   Performed at [facility], [place] [address] [place], [place] [address]  Admission on 03/06/2021, Discharged on 03/07/2021  Component Date Value Ref Range Status   Sodium 03/06/2021 136  135 - 145 mmol/L Final   Potassium 03/06/2021  3.9  3.5 - 5.1 mmol/L Final   Chloride 03/06/2021 104  98 - 111 mmol/L Final   CO2 03/06/2021 22  22 - 32 mmol/L Final   Glucose, Bld 03/06/2021 112 (A)  70 - 99 mg/dL Final   Glucose reference range applies only to samples taken after fasting for at least 8 hours.   BUN 03/06/2021 10  6 - 20 mg/dL Final   Creatinine, Ser 03/06/2021 0.64  0.44 - 1.00 mg/dL Final   Calcium 03/06/2021 9.4  8.9 - 10.3 mg/dL Final   Total Protein 03/06/2021 6.7  6.5 - 8.1 g/dL Final   Albumin 03/06/2021 4.0  3.5 - 5.0 g/dL Final   AST 03/06/2021 16  15 - 41 U/L Final   ALT 03/06/2021 20  0 - 44 U/L Final   Alkaline Phosphatase 03/06/2021 84  38 - 126 U/L Final   Total Bilirubin 03/06/2021 0.7  0.3 - 1.2 mg/dL Final   GFR, Estimated 03/06/2021 >60  >60 mL/min Final   Comment: (NOTE) Calculated using the CKD-EPI Creatinine Equation (2021)    Anion gap 03/06/2021 10  5 - 15 Final   Performed at [place] [address], [place], [place] [address]   WBC 03/06/2021 7.7  4.0 - 10.5 K/uL Final   RBC 03/06/2021 4.50  3.87 - 5.11 MIL/uL Final   Hemoglobin 03/06/2021 13.1  12.0 - 15.0 g/dL Final   HCT 03/06/2021 39.7  36.0 - 46.0  % Final   MCV 03/06/2021 88.2  80.0 - 100.0 fL Final   MCH 03/06/2021 29.1  26.0 - 34.0 pg Final   MCHC 03/06/2021 33.0  30.0 - 36.0 g/dL Final   RDW 03/06/2021 13.7  11.5 - 15.5 % Final   Platelets 03/06/2021 314  150 - 400 K/uL Final   nRBC 03/06/2021 0.0  0.0 - 0.2 % Final   Neutrophils Relative % 03/06/2021 35  % Final   Neutro Abs 03/06/2021 2.7  1.7 - 7.7 K/uL Final   Lymphocytes Relative 03/06/2021 53  % Final   Lymphs Abs 03/06/2021 4.1 (A)  0.7 - 4.0 K/uL Final   Monocytes Relative 03/06/2021 9  % Final   Monocytes Absolute 03/06/2021 0.7  0.1 - 1.0 K/uL Final   Eosinophils Relative 03/06/2021 3  % Final   Eosinophils Absolute 03/06/2021 0.2  0.0 - 0.5 K/uL Final   Basophils Relative 03/06/2021 0  % Final   Basophils Absolute 03/06/2021 0.0  0.0 - 0.1 K/uL Final   Immature Granulocytes 03/06/2021 0  % Final   Abs Immature Granulocytes 03/06/2021 0.02  0.00 - 0.07 K/uL Final   Performed at [facility], [place] [address], [place], [place] [address]   B Natriuretic Peptide 03/06/2021 16.9  0.0 - 100.0 pg/mL Final   Performed at [place] [address] [place], [place] [address]   I-stat hCG, quantitative 03/06/2021 <5.0  <5 mIU/mL Final   Comment 3 03/06/2021          Final   Comment:   GEST. AGE      CONC.  (mIU/mL)   <=1 WEEK        5 - 50     2 WEEKS       50 - 500     3 WEEKS       100 - 10,000     4 WEEKS     1,000 - 30,000        FEMALE AND  NON-PREGNANT FEMALE:     LESS THAN 5 mIU/mL    Prothrombin Time 03/06/2021 19.1 (A)  11.4 - 15.2 seconds Final   INR 03/06/2021 1.6 (A)  0.8 - 1.2 Final   Comment: (NOTE) INR goal varies based on device and disease states. Performed at [facility], [place] [address], [place], [place] [address]    Troponin I (High Sensitivity) 03/07/2021 3  <18 ng/L Final   Comment: (NOTE) Elevated high sensitivity troponin I (hsTnI) values and significant  changes across serial measurements may suggest ACS but many other   chronic and acute conditions are known to elevate hsTnI results.  Refer to the "Links" section for chest pain algorithms and additional  guidance. Performed at [facility], [place] [address] [place], [place] [address]    Troponin I (High Sensitivity) 03/07/2021 3  <18 ng/L Final   Comment: RESULT REPEATED AND VERIFIED (NOTE) Elevated high sensitivity troponin I (hsTnI) values and significant  changes across serial measurements may suggest ACS but many other  chronic and acute conditions are known to elevate hsTnI results.  Refer to the Links section for chest pain algorithms and additional  guidance. Performed at [facility], [place] [address] [place], [place] [address]   Admission on 02/25/2021, Discharged on 02/25/2021  Component Date Value Ref Range Status   WBC 02/25/2021 6.7  4.0 - 10.5 K/uL Final   RBC 02/25/2021 4.85  3.87 - 5.11 MIL/uL Final   Hemoglobin 02/25/2021 14.1  12.0 - 15.0 g/dL Final   HCT 02/25/2021 43.4  36.0 - 46.0 % Final   MCV 02/25/2021 89.5  80.0 - 100.0 fL Final   MCH 02/25/2021 29.1  26.0 - 34.0 pg Final   MCHC 02/25/2021 32.5  30.0 - 36.0 g/dL Final   RDW 02/25/2021 14.4  11.5 - 15.5 % Final   Platelets 02/25/2021 369  150 - 400 K/uL Final   nRBC 02/25/2021 0.0  0.0 - 0.2 % Final   Neutrophils Relative % 02/25/2021 42  % Final   Neutro Abs 02/25/2021 2.8  1.7 - 7.7 K/uL Final   Lymphocytes Relative 02/25/2021 49  % Final   Lymphs Abs 02/25/2021 3.2  0.7 - 4.0 K/uL Final   Monocytes Relative 02/25/2021 7  % Final   Monocytes Absolute 02/25/2021 0.5  0.1 - 1.0 K/uL Final   Eosinophils Relative 02/25/2021 2  % Final   Eosinophils Absolute 02/25/2021 0.1  0.0 - 0.5 K/uL Final   Basophils Relative 02/25/2021 0  % Final   Basophils Absolute 02/25/2021 0.0  0.0 - 0.1 K/uL Final   Immature Granulocytes 02/25/2021 0  % Final   Abs Immature Granulocytes 02/25/2021 0.03  0.00 - 0.07 K/uL Final   Performed at [facility], [place]  [address] [place], [place] [address]   Sodium 02/25/2021 137  135 - 145 mmol/L Final   Potassium 02/25/2021 5.1  3.5 - 5.1 mmol/L Final   Chloride 02/25/2021 102  98 - 111 mmol/L Final   CO2 02/25/2021 24  22 - 32 mmol/L Final   Glucose, Bld 02/25/2021 97  70 - 99 mg/dL Final   Glucose reference range applies only to samples taken after fasting for at least 8 hours.   BUN 02/25/2021 10  6 - 20 mg/dL Final   Creatinine, Ser 02/25/2021 0.81  0.44 - 1.00 mg/dL Final   Calcium 02/25/2021 9.5  8.9 - 10.3 mg/dL Final   Total Protein 02/25/2021 8.1  6.5 -  8.1 g/dL Final   Albumin 02/25/2021 4.7  3.5 - 5.0 g/dL Final   AST 02/25/2021 47 (A)  15 - 41 U/L Final   ALT 02/25/2021 41  0 - 44 U/L Final   Alkaline Phosphatase 02/25/2021 87  38 - 126 U/L Final   Total Bilirubin 02/25/2021 0.3  0.3 - 1.2 mg/dL Final   GFR, Estimated 02/25/2021 >60  >60 mL/min Final   Comment: (NOTE) Calculated using the CKD-EPI Creatinine Equation (2021)    Anion gap 02/25/2021 11  5 - 15 Final   Performed at [facility], [place] [address], [place], [place] [address]   Prothrombin Time 02/25/2021 29.8 (A)  11.4 - 15.2 seconds Final   INR 02/25/2021 2.8 (A)  0.8 - 1.2 Final   Comment: (NOTE) INR goal varies based on device and disease states. Performed at [facility], [place] [address] [place], [place] [address]    SARS Coronavirus 2 by RT PCR 02/25/2021 NEGATIVE  NEGATIVE Final   Comment: (NOTE) SARS-CoV-2 target nucleic acids are NOT DETECTED.  The SARS-CoV-2 RNA is generally detectable in upper respiratory specimens during the acute phase of infection. The lowest concentration of SARS-CoV-2 viral copies this assay can detect is 138 copies/mL. A negative result does not preclude SARS-Cov-2 infection and should not be used as the sole basis for treatment or other patient management decisions. A negative result may occur with  improper specimen collection/handling, submission of  specimen other than nasopharyngeal swab, presence of viral mutation(s) within the areas targeted by this assay, and inadequate number of viral copies(<138 copies/mL). A negative result must be combined with clinical observations, patient history, and epidemiological information. The expected result is Negative.  Fact Sheet for Patients:  EntrepreneurPulse.com.au  Fact Sheet for Healthcare Providers:  IncredibleEmployment.be  This test is no                          t yet approved or cleared by the [place] FDA and  has been authorized for detection and/or diagnosis of SARS-CoV-2 by FDA under an Emergency Use Authorization (EUA). This EUA will remain  in effect (meaning this test can be used) for the duration of the COVID-19 declaration under Section 564(b)(1) of the Act, 21 U.S.C.section 360bbb-3(b)(1), unless the authorization is terminated  or revoked sooner.       Influenza A by PCR 02/25/2021 NEGATIVE  NEGATIVE Final   Influenza B by PCR 02/25/2021 NEGATIVE  NEGATIVE Final   Comment: (NOTE) The Xpert Xpress SARS-CoV-2/FLU/RSV plus assay is intended as an aid in the diagnosis of influenza from Nasopharyngeal swab specimens and should not be used as a sole basis for treatment. Nasal washings and aspirates are unacceptable for Xpert Xpress SARS-CoV-2/FLU/RSV testing.  Fact Sheet for Patients: EntrepreneurPulse.com.au  Fact Sheet for Healthcare Providers: IncredibleEmployment.be  This test is not yet approved or cleared by the [place] FDA and has been authorized for detection and/or diagnosis of SARS-CoV-2 by FDA under an Emergency Use Authorization (EUA). This EUA will remain in effect (meaning this test can be used) for the duration of the COVID-19 declaration under Section 564(b)(1) of the Act, 21 U.S.C. section 360bbb-3(b)(1), unless the authorization is terminated  or revoked.  Performed at [facility], [place] [address] [place], [place] [address]   Office Visit on 02/07/2021  Component Date Value Ref Range Status   Glucose 02/07/2021 82  65 - 99 mg/dL Final  BUN 02/07/2021 10  6 - 24 mg/dL Final   Creatinine, Ser 02/07/2021 0.79  0.57 - 1.00 mg/dL Final   eGFR 02/07/2021 89  >59 mL/min/1.73 Final   BUN/Creatinine Ratio 02/07/2021 13  9 - 23 Final   Sodium 02/07/2021 141  134 - 144 mmol/L Final   Potassium 02/07/2021 4.8  3.5 - 5.2 mmol/L Final   Chloride 02/07/2021 104  96 - 106 mmol/L Final   Calcium 02/07/2021 9.4  8.7 - 10.2 mg/dL Final   Total Protein 02/07/2021 7.0  6.0 - 8.5 g/dL Final   Albumin 02/07/2021 4.8  3.8 - 4.9 g/dL Final   Globulin, Total 02/07/2021 2.2  1.5 - 4.5 g/dL Final   Albumin/Globulin Ratio 02/07/2021 2.2  1.2 - 2.2 Final   Bilirubin Total 02/07/2021 0.2  0.0 - 1.2 mg/dL Final   Alkaline Phosphatase 02/07/2021 101  44 - 121 IU/L Final   AST 02/07/2021 14  0 - 40 IU/L Final   TSH 02/07/2021 1.250  0.450 - 4.500 uIU/mL Final   WBC 02/07/2021 6.7  3.4 - 10.8 x10E3/uL Final   RBC 02/07/2021 4.86  3.77 - 5.28 x10E6/uL Final   Hemoglobin 02/07/2021 14.3  11.1 - 15.9 g/dL Final   Hematocrit 02/07/2021 42.5  34.0 - 46.6 % Final   MCV 02/07/2021 87  79 - 97 fL Final   MCH 02/07/2021 29.4  26.6 - 33.0 pg Final   MCHC 02/07/2021 33.6  31.5 - 35.7 g/dL Final   RDW 02/07/2021 15.2  11.7 - 15.4 % Final   Platelets 02/07/2021 250  150 - 450 x10E3/uL Final   Neutrophils 02/07/2021 50  Not Estab. % Final   Lymphs 02/07/2021 39  Not Estab. % Final   Monocytes 02/07/2021 8  Not Estab. % Final   Eos 02/07/2021 3  Not Estab. % Final   Basos 02/07/2021 0  Not Estab. % Final   Neutrophils Absolute 02/07/2021 3.3  1.4 - 7.0 x10E3/uL Final   Lymphocytes Absolute 02/07/2021 2.6  0.7 - 3.1 x10E3/uL Final   Monocytes Absolute 02/07/2021 0.6  0.1 - 0.9 x10E3/uL Final   EOS (ABSOLUTE) 02/07/2021 0.2  0.0 - 0.4 x10E3/uL  Final   Basophils Absolute 02/07/2021 0.0  0.0 - 0.2 x10E3/uL Final   Immature Granulocytes 02/07/2021 0  Not Estab. % Final   Immature Grans (Abs) 02/07/2021 0.0  0.0 - 0.1 x10E3/uL Final   Glucose, UA 02/07/2021 Negative  Negative Final   Bilirubin, UA 02/07/2021 neg   Final   Ketones, UA 02/07/2021 neg   Final   Spec Grav, UA 02/07/2021 >=1.030 (A)  1.010 - 1.025 Final   Blood, UA 02/07/2021 neg   Final   pH, UA 02/07/2021 5.0  5.0 - 8.0 Final   Protein, UA 02/07/2021 Negative  Negative Final   Urobilinogen, UA 02/07/2021 0.2  0.2 or 1.0 E.U./dL Final   Nitrite, UA 02/07/2021 neg   Final   Leukocytes, UA 02/07/2021 Small (1+) (A)  Negative Final   Urine Culture, Routine 02/07/2021 Final report   Final   Organism ID, Bacteria 02/07/2021 Comment   Final   Comment: Mixed urogenital flora Less than 10,000 colonies/mL   Admission on 02/05/2021, Discharged on 02/06/2021  Component Date Value Ref Range Status   WBC 02/05/2021 7.0  4.0 - 10.5 K/uL Final   RBC 02/05/2021 4.99  3.87 - 5.11 MIL/uL Final   Hemoglobin 02/05/2021 14.7  12.0 - 15.0 g/dL Final   HCT 02/05/2021 44.9  36.0 - 46.0 %  Final   MCV 02/05/2021 90.0  80.0 - 100.0 fL Final   MCH 02/05/2021 29.5  26.0 - 34.0 pg Final   MCHC 02/05/2021 32.7  30.0 - 36.0 g/dL Final   RDW 02/05/2021 14.8  11.5 - 15.5 % Final   Platelets 02/05/2021 278  150 - 400 K/uL Final   nRBC 02/05/2021 0.0  0.0 - 0.2 % Final   Neutrophils Relative % 02/05/2021 42  % Final   Neutro Abs 02/05/2021 2.9  1.7 - 7.7 K/uL Final   Lymphocytes Relative 02/05/2021 45  % Final   Lymphs Abs 02/05/2021 3.2  0.7 - 4.0 K/uL Final   Monocytes Relative 02/05/2021 9  % Final   Monocytes Absolute 02/05/2021 0.6  0.1 - 1.0 K/uL Final   Eosinophils Relative 02/05/2021 3  % Final   Eosinophils Absolute 02/05/2021 0.2  0.0 - 0.5 K/uL Final   Basophils Relative 02/05/2021 1  % Final   Basophils Absolute 02/05/2021 0.0  0.0 - 0.1 K/uL Final   Immature Granulocytes  02/05/2021 0  % Final   Abs Immature Granulocytes 02/05/2021 0.03  0.00 - 0.07 K/uL Final   Performed at [facility], [place] [address], [place], [place] [address]   Sodium 02/05/2021 137  135 - 145 mmol/L Final   Potassium 02/05/2021 3.9  3.5 - 5.1 mmol/L Final   Chloride 02/05/2021 105  98 - 111 mmol/L Final   CO2 02/05/2021 21 (A)  22 - 32 mmol/L Final   Glucose, Bld 02/05/2021 121 (A)  70 - 99 mg/dL Final   Glucose reference range applies only to samples taken after fasting for at least 8 hours.   BUN 02/05/2021 9  6 - 20 mg/dL Final   Creatinine, Ser 02/05/2021 0.78  0.44 - 1.00 mg/dL Final   Calcium 02/05/2021 9.4  8.9 - 10.3 mg/dL Final   Total Protein 02/05/2021 7.2  6.5 - 8.1 g/dL Final   Albumin 02/05/2021 3.9  3.5 - 5.0 g/dL Final   AST 02/05/2021 16  15 - 41 U/L Final   ALT 02/05/2021 23  0 - 44 U/L Final   Alkaline Phosphatase 02/05/2021 88  38 - 126 U/L Final   Total Bilirubin 02/05/2021 0.7  0.3 - 1.2 mg/dL Final   GFR, Estimated 02/05/2021 >60  >60 mL/min Final   Comment: (NOTE) Calculated using the CKD-EPI Creatinine Equation (2021)    Anion gap 02/05/2021 11  5 - 15 Final   Performed at [facility] [address] [place], [place] [address]   Prothrombin Time 02/05/2021 13.3  11.4 - 15.2 seconds Final   INR 02/05/2021 1.0  0.8 - 1.2 Final   Comment: (NOTE) INR goal varies based on device and disease states. Performed at [facility], [place] [address] [place],  [address]    SARS Coronavirus 2 by RT PCR 02/06/2021 NEGATIVE  NEGATIVE Final   Comment: (NOTE) SARS-CoV-2 target nucleic acids are NOT DETECTED.  The SARS-CoV-2 RNA is generally detectable in upper respiratory specimens during the acute phase of infection. The lowest concentration of SARS-CoV-2 viral copies this assay can detect is 138 copies/mL. A negative result does not preclude SARS-Cov-2 infection and should not be used as the sole basis for treatment or other patient  management decisions. A negative result may occur with  improper specimen collection/handling, submission of specimen other than nasopharyngeal swab, presence of viral mutation(s) within the areas targeted by this assay, and inadequate number of viral copies(<138  copies/mL). A negative result must be combined with clinical observations, patient history, and epidemiological information. The expected result is Negative.  Fact Sheet for Patients:  EntrepreneurPulse.com.au  Fact Sheet for Healthcare Providers:  IncredibleEmployment.be  This test is no                          t yet approved or cleared by the [place] FDA and  has been authorized for detection and/or diagnosis of SARS-CoV-2 by FDA under an Emergency Use Authorization (EUA). This EUA will remain  in effect (meaning this test can be used) for the duration of the COVID-19 declaration under Section 564(b)(1) of the Act, 21 U.S.C.section 360bbb-3(b)(1), unless the authorization is terminated  or revoked sooner.       Influenza A by PCR 02/06/2021 NEGATIVE  NEGATIVE Final   Influenza B by PCR 02/06/2021 NEGATIVE  NEGATIVE Final   Comment: (NOTE) The Xpert Xpress SARS-CoV-2/FLU/RSV plus assay is intended as an aid in the diagnosis of influenza from Nasopharyngeal swab specimens and should not be used as a sole basis for treatment. Nasal washings and aspirates are unacceptable for Xpert Xpress SARS-CoV-2/FLU/RSV testing.  Fact Sheet for Patients: EntrepreneurPulse.com.au  Fact Sheet for Healthcare Providers: IncredibleEmployment.be  This test is not yet approved or cleared by the [place] FDA and has been authorized for detection and/or diagnosis of SARS-CoV-2 by FDA under an Emergency Use Authorization (EUA). This EUA will remain in effect (meaning this test can be used) for the duration of the COVID-19 declaration under Section 564(b)(1)  of the Act, 21 U.S.C. section 360bbb-3(b)(1), unless the authorization is terminated or revoked.  Performed at [facility], [place] [address], [place], [place] [address]    Troponin I (High Sensitivity) 02/06/2021 <2  <18 ng/L Final   Comment: (NOTE) Elevated high sensitivity troponin I (hsTnI) values and significant  changes across serial measurements may suggest ACS but many other  chronic and acute conditions are known to elevate hsTnI results.  Refer to the "Links" section for chest pain algorithms and additional  guidance. Performed at [facility], [place] [address], [place], [place] [address]   Admission on 01/28/2021, Discharged on 01/28/2021  Component Date Value Ref Range Status   WBC 01/28/2021 7.0  4.0 - 10.5 K/uL Final   RBC 01/28/2021 4.55  3.87 - 5.11 MIL/uL Final   Hemoglobin 01/28/2021 13.4  12.0 - 15.0 g/dL Final   HCT 01/28/2021 42.3  36.0 - 46.0 % Final   MCV 01/28/2021 93.0  80.0 - 100.0 fL Final   MCH 01/28/2021 29.5  26.0 - 34.0 pg Final   MCHC 01/28/2021 31.7  30.0 - 36.0 g/dL Final   RDW 01/28/2021 15.5  11.5 - 15.5 % Final   Platelets 01/28/2021 449 (A)  150 - 400 K/uL Final   nRBC 01/28/2021 0.0  0.0 - 0.2 % Final   Neutrophils Relative % 01/28/2021 42  % Final   Neutro Abs 01/28/2021 2.9  1.7 - 7.7 K/uL Final   Lymphocytes Relative 01/28/2021 45  % Final   Lymphs Abs 01/28/2021 3.2  0.7 - 4.0 K/uL Final   Monocytes Relative 01/28/2021 9  % Final   Monocytes Absolute 01/28/2021 0.6  0.1 - 1.0 K/uL Final   Eosinophils Relative 01/28/2021 2  % Final   Eosinophils Absolute 01/28/2021 0.2  0.0 - 0.5 K/uL Final   Basophils Relative 01/28/2021 1  % Final   Basophils Absolute 01/28/2021  0.0  0.0 - 0.1 K/uL Final   Immature Granulocytes 01/28/2021 1  % Final   Abs Immature Granulocytes 01/28/2021 0.05  0.00 - 0.07 K/uL Final   Performed at [facility] [address], [place], [place] [address]   Sodium 01/28/2021 140  135 - 145 mmol/L Final    Potassium 01/28/2021 4.2  3.5 - 5.1 mmol/L Final   Chloride 01/28/2021 110  98 - 111 mmol/L Final   CO2 01/28/2021 20 (A)  22 - 32 mmol/L Final   Glucose, Bld 01/28/2021 80  70 - 99 mg/dL Final   Glucose reference range applies only to samples taken after fasting for at least 8 hours.   BUN 01/28/2021 12  6 - 20 mg/dL Final   Creatinine, Ser 01/28/2021 0.71  0.44 - 1.00 mg/dL Final   Calcium 01/28/2021 9.2  8.9 - 10.3 mg/dL Final   GFR, Estimated 01/28/2021 >60  >60 mL/min Final   Comment: (NOTE) Calculated using the CKD-EPI Creatinine Equation (2021)    Anion gap 01/28/2021 10  5 - 15 Final   Performed at [facility] [address] [place], [place] [address]  No results displayed because visit has over 200 results.    Hospital Outpatient Visit on 01/08/2021  Component Date Value Ref Range Status   WBC 01/08/2021 7.9  4.0 - 10.5 K/uL Final   RBC 01/08/2021 5.10  3.87 - 5.11 MIL/uL Final   Hemoglobin 01/08/2021 15.3 (A)  12.0 - 15.0 g/dL Final   HCT 01/08/2021 48.1 (A)  36.0 - 46.0 % Final   MCV 01/08/2021 94.3  80.0 - 100.0 fL Final   MCH 01/08/2021 30.0  26.0 - 34.0 pg Final   MCHC 01/08/2021 31.8  30.0 - 36.0 g/dL Final   RDW 01/08/2021 17.3 (A)  11.5 - 15.5 % Final   Platelets 01/08/2021 170  150 - 400 K/uL Final   nRBC 01/08/2021 0.0  0.0 - 0.2 % Final   Performed at [facility], [place] [address], [place], [place] [address]   Neutrophils Relative % 01/08/2021 58  % Final   Neutro Abs 01/08/2021 4.6  1.7 - 7.7 K/uL Final   Lymphocytes Relative 01/08/2021 25  % Final   Lymphs Abs 01/08/2021 1.9  0.7 - 4.0 K/uL Final   Monocytes Relative 01/08/2021 13  % Final   Monocytes Absolute 01/08/2021 1.0  0.1 - 1.0 K/uL Final   Eosinophils Relative 01/08/2021 1  % Final   Eosinophils Absolute 01/08/2021 0.1  0.0 - 0.5 K/uL Final   Basophils Relative 01/08/2021 1  % Final   Basophils Absolute 01/08/2021 0.0  0.0 - 0.1 K/uL Final   Immature Granulocytes  01/08/2021 2  % Final   Abs Immature Granulocytes 01/08/2021 0.16 (A)  0.00 - 0.07 K/uL Final   Performed at [facility], [place] [address], [place], [place] [address]  There may be more visits with results that are not included.    Allergies: Patient has no known allergies.  PTA Medications: (Not in a hospital admission)   Medical Decision Making  55 year old woman with history of alcoholism, factor V Leiden deficiency, depression, self-reported OCD, ADHD, PTSD who presented to [facility] for chief complaint of suicidal ideation.  Patient then denied suicidal ideation and reported chest pain and desire for assistance with substance use treatment.  Patient was medically cleared in the ED and was referred to the [facility] for alcohol detox.  While in the ED,  patient had consult to pharmacy to help with warfarin dosing as patient has been off of her anticoagulants.  Patient actively appears to be going through alcohol withdrawal and reports alcohol withdrawal symptoms of nausea, headache, sweating and tremors.  Will continue CIWA protocol and scheduled Ativan taper.  Will reassess desire to restart/initiate medications for depression and anxiety wants withdrawal symptoms more stable.  Informed by nursing today that patient has not had any oral intake; if continues to be unable to tolerate liquids or food may need to consider transferred to the ED  Factor V Leiden deficiency -Consult to pharmacy to assist with dosing -currently on subcutaneous Lovenox; received dose of 5 mg warfarin today  Alcohol use disorder, severe -Ativan taper -CIWA protocol -As needed's order to assist with alcohol withdrawal symptoms  MDD vs SIMD -Once patient's withdrawal symptoms are less severe, we will need to explore depression symptoms in more detail to determine if associated with substance use or independent of -Can consider to initiate medication for anxiety and depression if deemed  appropriate  Dispo: Ongoing.  Social work Sports administrator.  If patient remains on subcutaneous Lovenox at time of discharge may be a barrier to placement in rehab facility      Recommendations  Based on my evaluation the patient does not appear to have an emergency medical condition.  Ival Bible, MD 06/14/21  4:30 PM

## 2021-06-14 NOTE — Progress Notes (Signed)
Patient given 4 oz of grape juice.  Stated she may try and come out for dinner.

## 2021-06-14 NOTE — ED Notes (Addendum)
Patient is resting quietly in bed with eyes closed. No signs/symptoms of distress. Respirations even and unlabored. Patient had previously requested something for her severe back pain. An order was entered for PRN Tylenol but she refused medication when offer. Will continue to monitor for safety.

## 2021-06-14 NOTE — Progress Notes (Signed)
Patient drawn X3 today for PT INR.  Unable to get adequate sample.  Pharmacy aware.  Per Pharmacist, will dose today and draw again tomorrow.

## 2021-06-14 NOTE — ED Notes (Signed)
Patient states that she still feels "very depressed". She has not eaten today. She has been encouraged to drink fluids. At this time she has not been out of bed. States the she " feels cold and clammy" . Will continue to monitor for safety

## 2021-06-14 NOTE — Progress Notes (Signed)
Patient resting on bed.  Has not gotten up for meals and not drinking other than with meds. Denied having voided.  Patient stated she's not feeling well with nausea and alcohol cravings.  Stated she just wants to rest.  Vital signs stable.  MD notified of all of the above.  Per MD, continue to monitor.

## 2021-06-14 NOTE — ED Notes (Signed)
Pt refused dinner

## 2021-06-14 NOTE — BH Assessment (Signed)
Comprehensive Clinical Assessment (CCA) Note  06/14/2021 Kristina Owens [id number]  Chief Complaint:  Chief Complaint  Patient presents with   Addiction Problem   Alcohol Problem   Suicidal   Visit Diagnosis: Substance induced mood disorder    CCA Screening, Triage and Referral (STR)  Patient Reported Information How did you hear about [place]? Self  What Is the Reason for Your Visit/Call Today?  55 y.o. female patient admitted with complaints of suicidal thoughts, admits to alcohol use, crack cocaine and methamphetamine use.  She reports most recently using altering substances yesterday.  She presents today with request of help for detox.  Patient reports drinking about a pint to 1/5 of alcohol a day.  She also reports using crack cocaine and methamphetamine, intermittently.    How Long Has This Been Causing You Problems? > than 6 months  What Do You Feel Would Help You the Most Today? Alcohol or Drug Use Treatment; Treatment for Depression or other mood problem; Medication(s)  Have You Recently Had Any Thoughts About Hurting Yourself? Yes Are You Planning to Commit Suicide/Harm Yourself At This time? No  Have you Recently Had Thoughts About Hurting Someone [name]? No Are You Planning to Harm Someone at This Time? No Explanation: No data recorded  Have You Used Any Alcohol or Drugs in the Past 24 Hours? No How Long Ago Did You Use Drugs or Alcohol? No data recorded What Did You Use and How Much? No data recorded  Do You Currently Have a Therapist/Psychiatrist? No data recorded Name of Therapist/Psychiatrist: No data recorded  Have You Been Recently Discharged From Any Office Practice or Programs? No data recorded Explanation of Discharge From Practice/Program: No data recorded    CCA Screening Triage Referral Assessment Type of Contact: No data recorded Telemedicine Service Delivery:   Is this Initial or Reassessment? No data recorded Date Telepsych consult ordered in  CHL:  No data recorded Time Telepsych consult ordered in CHL:  No data recorded Location of Assessment: No data recorded Provider Location: No data recorded  Collateral Involvement: No data recorded  Does Patient Have a Court Appointed Legal Guardian? No data recorded Name and Contact of Legal Guardian: No data recorded If Minor and Not Living with Parent(s), Who has Custody? No data recorded Is CPS involved or ever been involved? No data recorded Is APS involved or ever been involved? No data recorded  Patient Determined To Be At Risk for Harm To Self or Others Based on Review of Patient Reported Information or Presenting Complaint? No data recorded Method: No data recorded Availability of Means: No data recorded Intent: No data recorded Notification Required: No data recorded Additional Information for Danger to Others Potential: No data recorded Additional Comments for Danger to Others Potential: No data recorded Are There Guns or Other Weapons in Your Home? No data recorded Types of Guns/Weapons: No data recorded Are These Weapons Safely Secured?                            No data recorded Who Could Verify You Are Able To Have These Secured: No data recorded Do You Have any Outstanding Charges, Pending Court Dates, Parole/Probation? No data recorded Contacted To Inform of Risk of Harm To Self or Others: No data recorded   Does Patient Present under Involuntary Commitment? No data recorded IVC Papers Initial File Date: No data recorded  [place] of Residence: No data recorded  Patient Currently Receiving the Following  Services: No data recorded  Determination of Need: Urgent (48 hours)  Options For Referral: Facility-Based Crisis    CCA Biopsychosocial Patient Reported Schizophrenia/Schizoaffective Diagnosis in Past: No   Strengths: Patient unable to identify any strengths at this time.   Mental Health Symptoms Depression:   Irritability; Change in energy/activity;  Difficulty Concentrating; Fatigue; Hopelessness; Increase/decrease in appetite; Sleep (too much or little)   Duration of Depressive symptoms:  Duration of Depressive Symptoms: Greater than two weeks   Mania:   None   Anxiety:    Irritability; Tension; Worrying   Psychosis:   None   Duration of Psychotic symptoms:    Trauma:   Irritability/anger   Obsessions:   None   Compulsions:   None   Inattention:   None   Hyperactivity/Impulsivity:   None   Oppositional/Defiant Behaviors:   None   Emotional Irregularity:   None   Other Mood/Personality Symptoms:  No data recorded   Mental Status Exam Appearance and self-care  Stature:   Average   Weight:   Average weight   Clothing:   Casual   Grooming:   Normal   Cosmetic use:   Age appropriate   Posture/gait:   Normal   Motor activity:   Not Remarkable   Sensorium  Attention:   Inattentive; Normal   Concentration:   Normal   Orientation:   X5   Recall/memory:   Normal   Affect and Mood  Affect:   Blunted; Flat; Depressed   Mood:   Depressed   Relating  Eye contact:   Avoided   Facial expression:   Depressed   Attitude toward examiner:   Cooperative; Irritable   Thought and Language  Speech flow:  Garbled; Normal   Thought content:   Appropriate to Mood and Circumstances   Preoccupation:   None   Hallucinations:   None   Organization:  No data recorded  [employer] of Knowledge:   Average   Intelligence:   Average   Abstraction:   Normal   Judgement:   Fair   Art therapist:   Adequate   Insight:   Fair   Decision Making:   Normal   Social Functioning  Social Maturity:   Isolates   Social Judgement:   Normal; "Games developer"   Stress  Stressors:   Housing; Teacher, music Ability:   Programme researcher, broadcasting/film/video Deficits:   None   Supports:   Support needed     Religion: Religion/Spirituality Are You A Religious Person?:  No  Leisure/Recreation: Leisure / Recreation Do You Have Hobbies?: No  Exercise/Diet: Exercise/Diet Do You Exercise?: No Have You Gained or Lost A Significant Amount of Weight in the Past Six Months?: No Do You Follow a Special Diet?: No Do You Have Any Trouble Sleeping?: Yes Explanation of Sleeping Difficulties: Reports only receiving 4-5 hours of sleep.   CCA Employment/Education Employment/Work Situation: Employment / Work Technical sales engineer: Unemployed Has Patient ever Been in Passenger transport manager?: No  Education: Education Is Patient Currently Attending School?: No Did Physicist, medical?: No Did You Have An Individualized Education Program (IIEP): No Did You Have Any Difficulty At [employer]?: No Patient's Education Has Been Impacted by Current Illness: No   CCA Family/Childhood History Family and Relationship History: Family history Marital status: Divorced Divorced, when?: Patient did not dicslose What types of issues is patient dealing with in the relationship?: Patient did not dicslose Does patient have children?: Yes How many children?: 1  How is patient's relationship with their children?: Reports having no relationship with her adult son  Childhood History:     Child/Adolescent Assessment:     CCA Substance Use Alcohol/Drug Use: Alcohol / Drug Use History of alcohol / drug use?: Yes Substance #1 Name of Substance 1: Crack cocaine 1 - Age of First Use: Unknown 1 - Amount (size/oz): Unknown 1 - Frequency: Daily 1 - Duration: Unknown 1 - Last Use / Amount: 06/12/2021 1 - Method of Aquiring: Unknown 1- Route of Use: Smoking Substance #2 Name of Substance 2: ETOH 2 - Age of First Use: Unknown 2 - Amount (size/oz): Varies between a pint and a fifth of liquor 2 - Frequency: Daily 2 - Duration: Unknown 2 - Last Use / Amount: 06/12/2021 2 - Method of Aquiring: Unknown 2 - Route of Substance Use: Oral Substance #3 Name of Substance 3: Crystal  Meth 3 - Age of First Use: Unknown 3 - Amount (size/oz): Unknown 3 - Frequency: Intermittently 3 - Duration: Unknown 3 - Last Use / Amount: 06/10/2021 3 - Method of Aquiring: Unknown 3 - Route of Substance Use: Smoking                   ASAM's:  Six Dimensions of Multidimensional Assessment  Dimension 1:  Acute Intoxication and/or Withdrawal Potential:      Dimension 2:  Biomedical Conditions and Complications:      Dimension 3:  Emotional, Behavioral, or Cognitive Conditions and Complications:     Dimension 4:  Readiness to Change:     Dimension 5:  Relapse, Continued use, or Continued Problem Potential:     Dimension 6:  Recovery/Living Environment:     ASAM Severity Score:    ASAM Recommended Level of Treatment: ASAM Recommended Level of Treatment: Level III Residential Treatment   Substance use Disorder (SUD) Substance Use Disorder (SUD)  Checklist Symptoms of Substance Use: Continued use despite having a persistent/recurrent physical/psychological problem caused/exacerbated by use, Continued use despite persistent or recurrent social, interpersonal problems, caused or exacerbated by use, Persistent desire or unsuccessful efforts to cut down or control use, Recurrent use that results in a failure to fulfill major role obligations (work, school, home), Social, occupational, recreational activities given up or reduced due to use  Recommendations for Services/Supports/Treatments: Recommendations for Services/Supports/Treatments Recommendations For Services/Supports/Treatments: SAIOP (Substance Abuse Intensive Outpatient Program), Residential-Level 1  Discharge Disposition: Discharge Disposition Medical Exam completed: Yes Disposition of Patient: Admit Mode of transportation if patient is discharged/movement?: Car  DSM5 Diagnoses: Patient Active Problem List   Diagnosis Date Noted   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   Pulmonary embolism ([place]) 01/08/2021    DVT (deep venous thrombosis) ([place]) 01/08/2021   Polysubstance (excluding opioids) dependence ([place]) 01/08/2021   Alcohol abuse with withdrawal ([place]) 01/08/2021   Bipolar disorder, in full remission, most recent episode depressed ([place]) 01/18/2020   Generalized anxiety disorder 01/18/2020   Alcohol use disorder, severe, in early remission, dependence ([place]) 01/18/2020   Cocaine use disorder, severe, in early remission ([place]) 01/18/2020   Suicidal ideation    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

## 2021-06-14 NOTE — ED Notes (Signed)
Pt is in the bed sleeping. Respirations are even and unlabored. No acute distress noted. Will continue to monitor for safety. 

## 2021-06-14 NOTE — Progress Notes (Signed)
ANTICOAGULATION CONSULT NOTE - Consult  Pharmacy Consult for enoxaparin and warfarin Indication: DVT  No Known Allergies  Patient Measurements: Wt = 68 kg  Vital Signs: Temp: 97.9 F (36.6 C) (11/11 1222) Temp Source: Temporal (11/11 1222) BP: 116/77 (11/11 1222) Pulse Rate: 77 (11/11 1222)  Labs: Recent Labs    06/12/21 2115 06/13/21 0947  HGB 15.3*  --   HCT 46.6*  --   PLT 273  --   LABPROT  --  13.2  INR  --  1.0  CREATININE 0.53  --      Estimated Creatinine Clearance: 75.2 mL/min (by C-G formula based on SCr of 0.53 mg/dL).   Medical History: Past Medical History:  Diagnosis Date   Anxiety    Depression    DVT (deep vein thrombosis) in pregnancy    GERD (gastroesophageal reflux disease)    Ovarian cyst     Medications:  (Not in a hospital admission)  Assessment: Pharmacy is consulted to dose enoxaparin and warfarin in 55 yo female with PMH of DVT. Pt was to be on warfarin but stopped taking recently. Pt was found to have factor V Leiden deficiency.   PMH includes an acute thrombus involving the saphenofemoral junction and left popliteal vein.  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  Of note, back in June, she had a CT angiogram done.  This showed multiple bilateral pulmonary emboli including saddle embolus at the bifurcation.  There was some right heart strain.  She was initially put on Lovenox and then transitioned over to Eliquis.  However, she ultimately had to go to interventional radiology and she had a mechanical thrombectomy with TPA.  This was done on 01/16/2021. Was then transitioned to warfarin.  Per Warfarin clinic note on 03/22/2021, Pt regimen was due to Warfarin 4mg  daily except 2 mg on Tuesdays and Thursdays.      Today,06/14/21 Unable to get INR today, after sticking patient x 3 times. No bleeding issues noted. Will still give a dose of warfarin tonight since goal is  on higher side per clinic note.  F/u INR 11/12  Goal of Therapy:  Goal : Per last warfarin clinic note on 03/22/2021, target INR was 2.5-3.0  Monitor platelets by anticoagulation protocol: Yes   Plan:  Enoxaparin 70 mg SQ q12h.  Warfarin 5 mg x1 Daily INR Daily CBC  Monitor for signs and symptoms of bleeding  03/24/2021, PharmD 06/14/2021 2:41 PM

## 2021-06-15 LAB — PROTIME-INR
INR: 1.8 — ABNORMAL HIGH (ref 0.8–1.2)
Prothrombin Time: 20.6 seconds — ABNORMAL HIGH (ref 11.4–15.2)

## 2021-06-15 MED ORDER — CHLORDIAZEPOXIDE HCL 25 MG PO CAPS
25.0000 mg | ORAL_CAPSULE | Freq: Four times a day (QID) | ORAL | Status: DC
  Administered 2021-06-15: 25 mg via ORAL
  Filled 2021-06-15: qty 1

## 2021-06-15 MED ORDER — CHLORDIAZEPOXIDE HCL 25 MG PO CAPS: 25.0000 mg | ORAL_CAPSULE | Freq: Three times a day (TID) | ORAL | Status: DC

## 2021-06-15 MED ORDER — CHLORDIAZEPOXIDE HCL 25 MG PO CAPS: 25.0000 mg | ORAL_CAPSULE | ORAL | Status: DC

## 2021-06-15 MED ORDER — CHLORDIAZEPOXIDE HCL 25 MG PO CAPS: 25.0000 mg | ORAL_CAPSULE | Freq: Four times a day (QID) | ORAL | Status: DC | PRN

## 2021-06-15 MED ORDER — NICOTINE 21 MG/24HR TD PT24
21.0000 mg | MEDICATED_PATCH | Freq: Every day | TRANSDERMAL | Status: DC
  Administered 2021-06-15: 21 mg via TRANSDERMAL
  Filled 2021-06-15: qty 1

## 2021-06-15 MED ORDER — WARFARIN SODIUM 2.5 MG PO TABS
2.5000 mg | ORAL_TABLET | Freq: Once | ORAL | Status: AC
  Administered 2021-06-15: 2.5 mg via ORAL

## 2021-06-15 MED ORDER — CHLORDIAZEPOXIDE HCL 25 MG PO CAPS: 25.0000 mg | ORAL_CAPSULE | Freq: Every day | ORAL | Status: DC

## 2021-06-15 NOTE — Progress Notes (Signed)
Patient requesting to leave.  Patient reporting alcohol and nicotine cravings.  Discussed with provider at new order received.  Patient medication for alcohol withdrawal and nicotine patch applied at approx. 1300.  Patient then went to cafeteria and ate lunch.  Patient appeared brighter.  Patient returned to desk recently and requested to discharge.  Message sent to provider.  Per provider, due to new medication changes and need for PT INR in AM, it would be better if patient discharged in AM.  Patient notified.  Patient stated that she would still like to discharge and is willing to sign out AMA.  Message sent via secure chat. Provider currently in with patient. Asked patient if she can wait to talk with provider.  Patient replied that she could and that she just didn't want to discharge after dark.

## 2021-06-15 NOTE — Progress Notes (Signed)
Patient out to nurse's station.  Reported that she's feeling very anxious and needed something.  Vital signs taken and BP 102/77.  CIWA score 13.  Patient denied having ate this AM and stated she had only drank a little.  Patient requesting discharge.  Stated anything is better than this.  Discussed above with Rodell Perna, NP.  Per NP, give Vistaril and NP will review chart and be over to assess patient.  Patient refused Vistaril.  Due to condition, patient allowed crackers and juice at bedside.  Noted that patient has eaten several packs of crackers and drank a cup of juice since earlier staff round this AM.

## 2021-06-15 NOTE — Discharge Instructions (Addendum)

## 2021-06-15 NOTE — ED Notes (Signed)
Refused to eat anything

## 2021-06-15 NOTE — Progress Notes (Signed)
Patient resting on bed.  Did come to window for meds with prompting.  Per Rodell Perna, NP request, Ativan given early.  Patient stated she doesn't feel well today and didn't want to get up for breakfast.  Continue to monitor for safety.

## 2021-06-15 NOTE — ED Notes (Signed)
Patient is resting quietly in bed with eyes closed. Respirations even and unlabored. No S/S of distress. Will continue to monitor for safety. °

## 2021-06-15 NOTE — Progress Notes (Signed)
ANTICOAGULATION CONSULT NOTE - Follow Up Consult  Pharmacy Consult for coumadin   No Known Allergies    Vital Signs: Temp: 97.7 F (36.5 C) (11/12 1114) Temp Source: Oral (11/12 1114) BP: 102/77 (11/12 1114) Pulse Rate: 72 (11/12 1114)  Labs: Recent Labs    06/12/21 2115 06/13/21 0947 06/15/21 0749  HGB 15.3*  --   --   HCT 46.6*  --   --   PLT 273  --   --   LABPROT  --  13.2 20.6*  INR  --  1.0 1.8*  CREATININE 0.53  --   --     Estimated Creatinine Clearance: 75.2 mL/min (by C-G formula based on SCr of 0.53 mg/dL).   Medications:  Scheduled:   enoxaparin (LOVENOX) injection  70 mg Subcutaneous BID   LORazepam  1 mg Oral TID   Followed by   Melene Muller ON 06/16/2021] LORazepam  1 mg Oral BID   Followed by   Melene Muller ON 06/17/2021] LORazepam  1 mg Oral Daily   multivitamin with minerals  1 tablet Oral Daily   thiamine  100 mg Intramuscular Once   thiamine  100 mg Oral Daily   warfarin  2.5 mg Oral ONCE-1600   Warfarin - Pharmacist Dosing Inpatient   Does not apply q1600    Assessment: INR increased to 1.8 today after two doses of 5 mg x 1  Goal of Therapy:  INR 2.5-3 per md     Plan:  Coumadin 2.5 mg x 1 today at 1600 PT/INR in AM    Kristina Owens 06/15/2021,12:25 PM

## 2021-06-15 NOTE — Progress Notes (Signed)
Lab drawn this AM and DASH called.  Patient resting in bed.  Stated she still feels rough.  Continue to monitor for safety.

## 2021-06-15 NOTE — ED Provider Notes (Signed)
FBC/OBS ASAP Discharge Summary  Date and Time: 06/15/2021 3:04 PM  Name: Kristina Owens  MRN:  [id number]   Discharge Diagnoses:  Final diagnoses:  Alcohol abuse with withdrawal (HCC)  Alcohol-induced mood disorder [facility])    Subjective: 55 year old woman with history of alcoholism, factor V Leiden deficiency, depression, self-reported OCD, ADHD, PTSD who presented to [facility] for chief complaint of suicidal ideation.  Patient then denied suicidal ideation and reported chest pain and desire for assistance with substance use treatment.  Patient was medically cleared in the ED and was referred to the [facility] for alcohol detox.  While in the ED, patient had consult to pharmacy to help with warfarin dosing as patient has been off of her anticoagulants.    Stay Summary: Patient seen and reevaluated face-to-face by this provider, and chart reviewed. On evaluation, patient is lying down in bed facing away from this provider with minimal eye contact. She is alert and oriented x4.  Her thought process is logical and speech is clear and coherent. Her mood is depressed and affect is congruent. She denies having thoughts of wanting to hurt herself or others. She denies auditory and visual hallucinations. She does not appear objectively to be responding to internal or external stimuli.  She reports poor sleep and that she slept on average 3 hours last night. She states that she has taken trazodone 50 mg in the past but it was not effective. She reports a poor appetite. She reports feeling "badly depressed" and describes her symptoms as sadness, crying spells, guilt, and worthlessness. She reports feeling anxious and describes it as heart racing fast. She describes her alcohol withdrawal symptom as shakes, sweats, fatigue and nausea. She denies vomiting, diarrhea, and tactile hallucinations.  Per nursing, pt is requesting to leave. Patient reevaluated per request. Patient states that this is a voluntary facility  and she would like to discharge. She states that she cannot be made to stay here for treatment. She states that she would like to leave AGAINST MEDICAL ADVICE. She states that she was not taking Coumadin for the past 3 months and does not need sample medications. She states that she follows up with the [facility] in [facility] and she will follow up on her own for medications/medical problems. Patient was encouraged to stay at [facility] until tomorrow morning to allow time INR/PT lab draw for medication dosing. Patient refused and states that she wants to leave AMA. She states that she was drunk and was never suicidal. She states that she plans to go to a friend house. She states that she does not have the friend's number for this provider to contact for safety planning.   Total Time spent with patient: 20 minutes  Past Psychiatric History:  Previous Medication Trials: Abilify, Lamictal, BuSpar, Zoloft, Prozac Previous Psychiatric Hospitalizations: Yes.  "Short terms".  Unable/unwilling  to provide additional details at this time unable to state when her last hospitalization was or for what reason she was hospitalized Previous Suicide Attempts: denies History of Violence: denies Outpatient psychiatrist: no  Past Medical History:  Past Medical History:  Diagnosis Date   Anxiety    Depression    DVT (deep vein thrombosis) in pregnancy    GERD (gastroesophageal reflux disease)    Ovarian cyst     Past Surgical History:  Procedure Laterality Date   APPENDECTOMY  age 57   IR PTA VENOUS EXCEPT DIALYSIS CIRCUIT  01/16/2021   IR RADIOLOGIST EVAL & MGMT  03/19/2021  IR THROMBECT VENO MECH MOD SED  01/16/2021   IR US GUIDE VASC ACCESS LEFT  01/16/2021   IR VENO/EXT/UNI LEFT  01/16/2021   IR VENOCAVAGRAM IVC  01/16/2021   TONSILLECTOMY Bilateral age 32   Family History:  Family History  Problem Relation Age of Onset   Asthma Mother    COPD Mother    Cancer Father        thyroid cancer    Family Psychiatric History: No hx reported Social History:  Social History   Substance and Sexual Activity  Alcohol Use Yes     Social History   Substance and Sexual Activity  Drug Use Yes   Types: "Crack" cocaine, Cocaine, Methamphetamines   Comment: last used 4/31/22    Social History   Socioeconomic History   Marital status: Divorced    Spouse name: Not on file   Number of children: Not on file   Years of education: Not on file   Highest education level: Not on file  Occupational History   Not on file  Tobacco Use   Smoking status: Every Day    Packs/day: 0.50    Years: 35.00    Pack years: 17.50    Types: Cigarettes   Smokeless tobacco: Never  Vaping Use   Vaping Use: Never used  Substance and Sexual Activity   Alcohol use: Yes   Drug use: Yes    Types: "Crack" cocaine, Cocaine, Methamphetamines    Comment: last used 4/31/22   Sexual activity: Not on file  Other Topics Concern   Not on file  Social History Narrative   Not on file   Social Determinants of Health   Financial Resource Strain: Not on file  Food Insecurity: Not on file  Transportation Needs: Not on file  Physical Activity: Not on file  Stress: Not on file  Social Connections: Not on file   SDOH:  SDOH Screenings   Alcohol Screen: Not on file  Depression (PHQ2-9): Medium Risk   PHQ-2 Score: 22  Financial Resource Strain: Not on file  Food Insecurity: Not on file  Housing: Not on file  Physical Activity: Not on file  Social Connections: Not on file  Stress: Not on file  Tobacco Use: High Risk   Smoking Tobacco Use: Every Day   Smokeless Tobacco Use: Never   Passive Exposure: Not on file  Transportation Needs: Not on file    Tobacco Cessation:  Prescription not provided because: pt declined   Current Medications:  Current Facility-Administered Medications  Medication Dose Route Frequency Provider Last Rate Last Admin   acetaminophen (TYLENOL) tablet 650 mg  650 mg Oral Q6H  PRN Nwoko, Uchenna E, PA       chlordiazePOXIDE (LIBRIUM) capsule 25 mg  25 mg Oral QID Samarra Ridgely L, NP   25 mg at 06/15/21 1259   Followed by   Melene Muller ON 06/16/2021] chlordiazePOXIDE (LIBRIUM) capsule 25 mg  25 mg Oral TID Quintavius Niebuhr L, NP       Followed by   Melene Muller ON 06/17/2021] chlordiazePOXIDE (LIBRIUM) capsule 25 mg  25 mg Oral BH-qamhs Sera Hitsman L, NP       Followed by   Melene Muller ON 06/18/2021] chlordiazePOXIDE (LIBRIUM) capsule 25 mg  25 mg Oral Daily Mandy Peeks L, NP       chlordiazePOXIDE (LIBRIUM) capsule 25 mg  25 mg Oral Q6H PRN Delfin Squillace L, NP       enoxaparin (LOVENOX) injection 70 mg  70 mg Subcutaneous  BID Phylliss Blakes, RPH   70 mg at 06/15/21 5284   hydrOXYzine (ATARAX/VISTARIL) tablet 25 mg  25 mg Oral Q6H PRN Estella Husk, MD       loperamide (IMODIUM) capsule 2-4 mg  2-4 mg Oral PRN Lenard Lance, FNP       multivitamin with minerals tablet 1 tablet  1 tablet Oral Daily Estella Husk, MD   1 tablet at 06/15/21 0847   nicotine (NICODERM CQ - dosed in mg/24 hours) patch 21 mg  21 mg Transdermal Daily Renna Kilmer L, NP   21 mg at 06/15/21 1302   ondansetron (ZOFRAN-ODT) disintegrating tablet 4 mg  4 mg Oral Q6H PRN Lenard Lance, FNP   4 mg at 06/15/21 0654   thiamine (B-1) injection 100 mg  100 mg Intramuscular Once Lenard Lance, FNP       thiamine tablet 100 mg  100 mg Oral Daily Estella Husk, MD   100 mg at 06/15/21 1324   warfarin (COUMADIN) tablet 2.5 mg  2.5 mg Oral ONCE-1600 Estella Husk, MD       Warfarin - Pharmacist Dosing Inpatient   Does not apply q1600 Phylliss Blakes, [facility]   Given at 06/14/21 1608   Current Outpatient Medications  Medication Sig Dispense Refill   acetaminophen (TYLENOL) 500 MG tablet Take 1,000 mg by mouth every 4 (four) hours as needed (for pain- ALTERNATING WITH IBUPROFEN).     ARIPiprazole (ABILIFY) 10 MG tablet Take 1 tablet (10 mg total) by mouth daily. 30 tablet 0    busPIRone (BUSPAR) 10 MG tablet Take 1 tablet (10 mg total) by mouth 3 (three) times daily. (Patient taking differently: Take 10 mg by mouth 3 (three) times daily as needed (for anxiety).) 30 tablet 1   lamoTRIgine (LAMICTAL) 25 MG tablet Take 1 tablet (25 mg total) by mouth daily. 30 tablet 0   sertraline (ZOLOFT) 50 MG tablet Take 1 tablet (50 mg total) by mouth daily. 30 tablet 2   warfarin (COUMADIN) 4 MG tablet Take 4 mg by mouth daily.      PTA Medications: (Not in a hospital admission)   Musculoskeletal  Strength & Muscle Tone: within normal limits Gait & Station: normal Patient leans: N/A  Psychiatric Specialty Exam  Presentation  General Appearance: Disheveled  Eye Contact:Minimal  Speech:Clear and Coherent  Speech Volume:Normal  Handedness:Right   Mood and Affect  Mood:Depressed  Affect:Congruent   Thought Process  Thought Processes:Coherent  Descriptions of Associations:Intact  Orientation:Full (Time, Place and Person)  Thought Content:Logical  Diagnosis of Schizophrenia or Schizoaffective disorder in past: No    Hallucinations:Hallucinations: None  Ideas of Reference:None  Suicidal Thoughts:Suicidal Thoughts: No  Homicidal Thoughts:Homicidal Thoughts: No   Sensorium  Memory:Immediate Fair; Recent Fair; Remote Fair  Judgment:Intact  Insight:Present   Executive Functions  Concentration:Fair  Attention Span:Fair  Recall:Fair  Fund of Knowledge:Fair  Language:Fair   Psychomotor Activity  Psychomotor Activity:Psychomotor Activity: Decreased   Assets  Assets:Communication Skills; Desire for Improvement; Financial Resources/Insurance   Sleep  Sleep:Sleep: Poor Number of Hours of Sleep: 3   Nutritional Assessment (For OBS and FBC admissions only) Has the patient had a weight loss or gain of 10 pounds or more in the last 3 months?: -- (unknown)    Physical Exam  Physical Exam Constitutional:      Appearance: Normal  appearance.  HENT:     Nose: Nose normal.  Eyes:     Conjunctiva/sclera: Conjunctivae normal.  Cardiovascular:     Rate and Rhythm: Normal rate.  Pulmonary:     Effort: Pulmonary effort is normal.  Musculoskeletal:        General: Normal range of motion.     Cervical back: Normal range of motion.  Neurological:     Mental Status: She is alert and oriented to person, place, and time.   Review of Systems  Constitutional: Negative.   HENT: Negative.    Eyes: Negative.   Respiratory: Negative.    Cardiovascular: Negative.   Gastrointestinal: Negative.   Genitourinary: Negative.   Musculoskeletal: Negative.   Skin: Negative.   Neurological: Negative.   Endo/Heme/Allergies: Negative.   Blood pressure 102/77, pulse 72, temperature 97.7 F (36.5 C), temperature source Oral, resp. rate 18, SpO2 98 %. There is no height or weight on file to calculate BMI.  Demographic Factors:  Caucasian and Low socioeconomic status  Loss Factors: Financial problems/change in socioeconomic status  Historical Factors: Impulsivity  Risk Reduction Factors:   Sense of responsibility to family, Positive social support, and Positive coping skills or problem solving skills  Continued Clinical Symptoms:  Alcohol/Substance Abuse/Dependencies Previous Psychiatric Diagnoses and Treatments  Cognitive Features That Contribute To Risk:  None    Suicide Risk:  Minimal: No identifiable suicidal ideation.  Patients presenting with no risk factors but with morbid ruminations; may be classified as minimal risk based on the severity of the depressive symptoms  Plan Of Care/Follow-up recommendations:  Activity:  as tolerated  Patient declined follow up. She states that she follow up with [facility] in [facility] for medication management.  Disposition: Discharge to home  Layla Barter, NP 06/15/2021, 3:04 PM

## 2021-06-15 NOTE — Progress Notes (Signed)
Patient signing AMA form.  Patient said, "I'm doing the wrong thing, aren't I?"  Explained that she had wanted treatment and we're here to help her and she's already in process.  Also reiterated need for PT INR in AM and blood thinners.  Patient signed her belongings sheet and asked if she was getting a bus pass.  Marjenia, AC provided a bus pass to patient.  Reviewed AVS with patient and all questions answered.  Asked patient one last time if she was sure she wanted to discharge.  Patient shrugged her shoulders and said "what's going to be different tomorrow?"  Bess Harvest, [facility], talking with patient. Patient insistent upon discharge.  Patient ambulated independently to locker without issue.  Discharge in stable condition; no acute distress noted.

## 2021-06-18 ENCOUNTER — Telehealth (HOSPITAL_COMMUNITY): Payer: Self-pay | Admitting: Nurse Practitioner

## 2021-06-18 NOTE — BH Assessment (Signed)
Care Management - Follow Up Discharges   Writer attempted to make contact with patient today and was unsuccessful.  The phone number listed in epic not a valid number.  Per chart review, patient will follow up with her established provider at a [facility] in [place].

## 2021-08-31 ENCOUNTER — Encounter (HOSPITAL_COMMUNITY): Payer: Self-pay | Admitting: Emergency Medicine

## 2021-08-31 ENCOUNTER — Emergency Department (HOSPITAL_COMMUNITY)
Admission: EM | Admit: 2021-08-31 | Discharge: 2021-08-31 | Disposition: A | Discharge status: Home / Self Care | Payer: Medicaid Other | Attending: Emergency Medicine | Admitting: Emergency Medicine

## 2021-08-31 ENCOUNTER — Emergency Department (HOSPITAL_COMMUNITY): Payer: Medicaid Other

## 2021-08-31 DIAGNOSIS — F10129 Alcohol abuse with intoxication, unspecified: Secondary | ICD-10-CM | POA: Insufficient documentation

## 2021-08-31 DIAGNOSIS — R1030 Lower abdominal pain, unspecified: Secondary | ICD-10-CM | POA: Insufficient documentation

## 2021-08-31 DIAGNOSIS — R519 Headache, unspecified: Secondary | ICD-10-CM | POA: Insufficient documentation

## 2021-08-31 DIAGNOSIS — R300 Dysuria: Secondary | ICD-10-CM | POA: Insufficient documentation

## 2021-08-31 DIAGNOSIS — R61 Generalized hyperhidrosis: Secondary | ICD-10-CM | POA: Insufficient documentation

## 2021-08-31 DIAGNOSIS — F1410 Cocaine abuse, uncomplicated: Secondary | ICD-10-CM | POA: Insufficient documentation

## 2021-08-31 DIAGNOSIS — F1910 Other psychoactive substance abuse, uncomplicated: Secondary | ICD-10-CM

## 2021-08-31 LAB — URINALYSIS, ROUTINE W REFLEX MICROSCOPIC
Bilirubin Urine: NEGATIVE
Glucose, UA: NEGATIVE mg/dL
Ketones, ur: NEGATIVE mg/dL
Nitrite: NEGATIVE
Protein, ur: NEGATIVE mg/dL
Specific Gravity, Urine: 1.015 (ref 1.005–1.030)
pH: 6 (ref 5.0–8.0)

## 2021-08-31 LAB — COMPREHENSIVE METABOLIC PANEL
ALT: 16 U/L (ref 0–44)
AST: 20 U/L (ref 15–41)
Albumin: 4.4 g/dL (ref 3.5–5.0)
Alkaline Phosphatase: 108 U/L (ref 38–126)
Anion gap: 9 (ref 5–15)
BUN: 9 mg/dL (ref 6–20)
CO2: 28 mmol/L (ref 22–32)
Calcium: 8.9 mg/dL (ref 8.9–10.3)
Chloride: 105 mmol/L (ref 98–111)
Creatinine, Ser: 0.51 mg/dL (ref 0.44–1.00)
GFR, Estimated: >60 mL/min (ref >60)
Glucose, Bld: 94 mg/dL (ref 70–99)
Potassium: 4.1 mmol/L (ref 3.5–5.1)
Sodium: 142 mmol/L (ref 135–145)
Total Bilirubin: 0.5 mg/dL (ref 0.3–1.2)
Total Protein: 7.7 g/dL (ref 6.5–8.1)

## 2021-08-31 LAB — CBC WITH DIFFERENTIAL/PLATELET
Abs Immature Granulocytes: 0.08 10*3/uL — ABNORMAL HIGH (ref 0.00–0.07)
Basophils Absolute: 0.1 10*3/uL (ref 0.0–0.1)
Basophils Relative: 1 %
Eosinophils Absolute: 0.5 10*3/uL (ref 0.0–0.5)
Eosinophils Relative: 6 %
HCT: 46.2 % — ABNORMAL HIGH (ref 36.0–46.0)
Hemoglobin: 15.5 g/dL — ABNORMAL HIGH (ref 12.0–15.0)
Immature Granulocytes: 1 %
Lymphocytes Relative: 32 %
Lymphs Abs: 2.9 10*3/uL (ref 0.7–4.0)
MCH: 29.5 pg (ref 26.0–34.0)
MCHC: 33.5 g/dL (ref 30.0–36.0)
MCV: 88 fL (ref 80.0–100.0)
Monocytes Absolute: 0.8 10*3/uL (ref 0.1–1.0)
Monocytes Relative: 9 %
Neutro Abs: 4.8 10*3/uL (ref 1.7–7.7)
Neutrophils Relative %: 51 %
Platelets: 389 10*3/uL (ref 150–400)
RBC: 5.25 MIL/uL — ABNORMAL HIGH (ref 3.87–5.11)
RDW: 13.6 % (ref 11.5–15.5)
WBC: 9.1 10*3/uL (ref 4.0–10.5)
nRBC: 0 % (ref 0.0–0.2)

## 2021-08-31 LAB — CBG MONITORING, ED: Glucose-Capillary: 100 mg/dL — ABNORMAL HIGH (ref 70–99)

## 2021-08-31 LAB — ETHANOL: Alcohol, Ethyl (B): 125 mg/dL — ABNORMAL HIGH (ref <10)

## 2021-08-31 LAB — RAPID URINE DRUG SCREEN, HOSP PERFORMED
Amphetamines: NOT DETECTED
Barbiturates: NOT DETECTED
Benzodiazepines: NOT DETECTED
Cocaine: POSITIVE — AB
Opiates: NOT DETECTED
Tetrahydrocannabinol: NOT DETECTED

## 2021-08-31 LAB — LIPASE, BLOOD: Lipase: 34 U/L (ref 11–51)

## 2021-08-31 MED ORDER — SODIUM CHLORIDE 0.9 % IV BOLUS
1000.0000 mL | Freq: Once | INTRAVENOUS | Status: AC
  Administered 2021-08-31: 1000 mL via INTRAVENOUS

## 2021-08-31 MED ORDER — LORAZEPAM 1 MG PO TABS
1.0000 mg | ORAL_TABLET | Freq: Once | ORAL | Status: AC
  Administered 2021-08-31: 1 mg via ORAL
  Filled 2021-08-31: qty 1

## 2021-08-31 MED ORDER — ONDANSETRON HCL 4 MG/2ML IJ SOLN
4.0000 mg | Freq: Once | INTRAMUSCULAR | Status: AC
  Administered 2021-08-31: 4 mg via INTRAVENOUS
  Filled 2021-08-31: qty 2

## 2021-08-31 MED ORDER — NITROFURANTOIN MONOHYD MACRO 100 MG PO CAPS: 100.0000 mg | ORAL_CAPSULE | Freq: Two times a day (BID) | ORAL | 0 refills | Status: AC
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
## 2021-08-31 NOTE — ED Notes (Signed)
Patient ambulate to the bathroom with no problem. Patient was steady on her feet while ambulating. Patient was provide with a meal/fluid and tolerated it with no problem.
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
## 2021-08-31 NOTE — Discharge Instructions (Addendum)
You were seen in the emergency department today for alcohol and cocaine use.  While you are here we gave you some Ativan.  I am providing you with resources for substance abuse centers as well as resource guide for local shelters that you can use.  Please get in contact with these facilities in order to have placement for ongoing detox and sobriety as well as safe housing.  Please return to the emergency department if you have any worsening of your withdrawal symptoms.

## 2021-10-09 ENCOUNTER — Encounter (HOSPITAL_COMMUNITY): Payer: Self-pay | Admitting: *Deleted

## 2021-10-09 ENCOUNTER — Emergency Department (HOSPITAL_COMMUNITY)
Admission: EM | Admit: 2021-10-09 | Discharge: 2021-10-09 | Disposition: A | Discharge status: Home / Self Care | Payer: Medicaid Other | Attending: Emergency Medicine | Admitting: Emergency Medicine

## 2021-10-09 ENCOUNTER — Other Ambulatory Visit: Payer: Self-pay

## 2021-10-09 DIAGNOSIS — N390 Urinary tract infection, site not specified: Secondary | ICD-10-CM | POA: Insufficient documentation

## 2021-10-09 DIAGNOSIS — F1020 Alcohol dependence, uncomplicated: Secondary | ICD-10-CM | POA: Insufficient documentation

## 2021-10-09 DIAGNOSIS — F1010 Alcohol abuse, uncomplicated: Secondary | ICD-10-CM

## 2021-10-09 DIAGNOSIS — Y906 Blood alcohol level of 120-199 mg/100 ml: Secondary | ICD-10-CM | POA: Insufficient documentation

## 2021-10-09 LAB — CBC WITH DIFFERENTIAL/PLATELET
Abs Immature Granulocytes: 0.07 10*3/uL (ref 0.00–0.07)
Basophils Absolute: 0.1 10*3/uL (ref 0.0–0.1)
Basophils Relative: 1 %
Eosinophils Absolute: 0.3 10*3/uL (ref 0.0–0.5)
Eosinophils Relative: 3 %
HCT: 43.2 % (ref 36.0–46.0)
Hemoglobin: 14.3 g/dL (ref 12.0–15.0)
Immature Granulocytes: 1 %
Lymphocytes Relative: 47 %
Lymphs Abs: 4.5 10*3/uL — ABNORMAL HIGH (ref 0.7–4.0)
MCH: 29.9 pg (ref 26.0–34.0)
MCHC: 33.1 g/dL (ref 30.0–36.0)
MCV: 90.2 fL (ref 80.0–100.0)
Monocytes Absolute: 0.6 10*3/uL (ref 0.1–1.0)
Monocytes Relative: 6 %
Neutro Abs: 4 10*3/uL (ref 1.7–7.7)
Neutrophils Relative %: 42 %
Platelets: 303 10*3/uL (ref 150–400)
RBC: 4.79 MIL/uL (ref 3.87–5.11)
RDW: 13.6 % (ref 11.5–15.5)
WBC: 9.5 10*3/uL (ref 4.0–10.5)
nRBC: 0 % (ref 0.0–0.2)

## 2021-10-09 LAB — URINALYSIS, ROUTINE W REFLEX MICROSCOPIC
Bilirubin Urine: NEGATIVE
Glucose, UA: NEGATIVE mg/dL
Ketones, ur: NEGATIVE mg/dL
Nitrite: NEGATIVE
Protein, ur: NEGATIVE mg/dL
Specific Gravity, Urine: 1.018 (ref 1.005–1.030)
WBC, UA: >50 WBC/hpf — ABNORMAL HIGH (ref 0–5)
pH: 5 (ref 5.0–8.0)

## 2021-10-09 LAB — COMPREHENSIVE METABOLIC PANEL
ALT: 17 U/L (ref 0–44)
AST: 19 U/L (ref 15–41)
Albumin: 3.8 g/dL (ref 3.5–5.0)
Alkaline Phosphatase: 106 U/L (ref 38–126)
Anion gap: 9 (ref 5–15)
BUN: 9 mg/dL (ref 6–20)
CO2: 26 mmol/L (ref 22–32)
Calcium: 8.3 mg/dL — ABNORMAL LOW (ref 8.9–10.3)
Chloride: 105 mmol/L (ref 98–111)
Creatinine, Ser: 0.59 mg/dL (ref 0.44–1.00)
GFR, Estimated: >60 mL/min (ref >60)
Glucose, Bld: 94 mg/dL (ref 70–99)
Potassium: 3.4 mmol/L — ABNORMAL LOW (ref 3.5–5.1)
Sodium: 140 mmol/L (ref 135–145)
Total Bilirubin: 0.2 mg/dL — ABNORMAL LOW (ref 0.3–1.2)
Total Protein: 7.1 g/dL (ref 6.5–8.1)

## 2021-10-09 LAB — LIPASE, BLOOD: Lipase: 33 U/L (ref 11–51)

## 2021-10-09 LAB — PROTIME-INR
INR: 1 (ref 0.8–1.2)
Prothrombin Time: 13.4 seconds (ref 11.4–15.2)

## 2021-10-09 LAB — ETHANOL: Alcohol, Ethyl (B): 162 mg/dL — ABNORMAL HIGH (ref <10)

## 2021-10-09 MED ORDER — CEPHALEXIN 500 MG PO CAPS: 500.0000 mg | ORAL_CAPSULE | Freq: Three times a day (TID) | ORAL | 0 refills | Status: DC

## 2021-10-09 MED ORDER — CEPHALEXIN 500 MG PO CAPS
500.0000 mg | ORAL_CAPSULE | Freq: Once | ORAL | Status: AC
  Administered 2021-10-09: 500 mg via ORAL
  Filled 2021-10-09: qty 1

## 2021-10-09 MED ORDER — CHLORDIAZEPOXIDE HCL 25 MG PO CAPS: ORAL_CAPSULE | ORAL | 0 refills | Status: DC

## 2021-10-09 MED ORDER — ONDANSETRON 8 MG PO TBDP
8.0000 mg | ORAL_TABLET | Freq: Once | ORAL | Status: AC
  Administered 2021-10-09: 8 mg via ORAL
  Filled 2021-10-09: qty 1

## 2021-10-09 NOTE — ED Notes (Signed)
Provided pt w/labeled specimen cup for urine collection per MD order. ENMiles 

## 2021-10-09 NOTE — Discharge Instructions (Signed)
Begin taking Keflex as prescribed. ? ?Take Librium as prescribed for help with alcohol withdrawal. ? ?Follow-up with outpatient resources as needed. ?

## 2021-10-09 NOTE — ED Provider Notes (Signed)
?[facility]-EMERGENCY DEPT ?Provider Note ? ? ?CSN: [id number] ?Arrival date & time: 10/09/21  0138 ? ?  ? ?History ? ?Chief Complaint  ?Patient presents with  ? Alcohol Problem  ? ? ?Arriel [place] is a 56 y.o. female. ? ?Patient is a 56 year old female with past medical history of chronic alcohol abuse, anxiety, polysubstance abuse.  Patient presenting today for evaluation of possible alcohol withdrawal.  She states she feels shaky when she stops drinking.  Last drink was prior to arrival.  She denies fevers or chills.  She denies abdominal pain.  She also reports a sore area to the pilonidal region that she reports "came to ahead" and bled slightly several days ago. ? ?The history is provided by the patient.  ? ?  ? ?Home Medications ?Prior to Admission medications   ?Not on File  ?   ? ?Allergies    ?Patient has no known allergies.   ? ?Review of Systems   ?Review of Systems  ?All other systems reviewed and are negative. ? ?Physical Exam ?Updated Vital Signs ?BP (!) 141/95 (BP Location: Right Arm)   Pulse 82   Temp 97.6 ?F (36.4 ?C) (Oral)   Resp 16   SpO2 100%  ?Physical Exam ?Vitals and nursing note reviewed.  ?Constitutional:   ?   General: She is not in acute distress. ?   Appearance: She is well-developed. She is not diaphoretic.  ?   Comments: Patient is a somewhat disheveled, thin 56 year old female in no acute distress.  ?HENT:  ?   Head: Normocephalic and atraumatic.  ?Cardiovascular:  ?   Rate and Rhythm: Normal rate and regular rhythm.  ?   Heart sounds: No murmur heard. ?  No friction rub. No gallop.  ?Pulmonary:  ?   Effort: Pulmonary effort is normal. No respiratory distress.  ?   Breath sounds: Normal breath sounds. No wheezing.  ?Abdominal:  ?   General: Bowel sounds are normal. There is no distension.  ?   Palpations: Abdomen is soft.  ?   Tenderness: There is no abdominal tenderness.  ?Musculoskeletal:     ?   General: Normal range of motion.  ?   Cervical back: Normal  range of motion and neck supple.  ?Skin: ?   General: Skin is warm and dry.  ?Neurological:  ?   General: No focal deficit present.  ?   Mental Status: She is alert and oriented to person, place, and time.  ? ? ?ED Results / Procedures / Treatments   ?Labs ?(all labs ordered are listed, but only abnormal results are displayed) ?Labs Reviewed  ?COMPREHENSIVE METABOLIC PANEL  ?ETHANOL  ?LIPASE, BLOOD  ?CBC WITH DIFFERENTIAL/PLATELET  ?PROTIME-INR  ?URINALYSIS, ROUTINE W REFLEX MICROSCOPIC  ? ? ?EKG ?None ? ?Radiology ?No results found. ? ?Procedures ?Procedures  ? ? ?Medications Ordered in ED ?Medications - No data to display ? ?ED Course/ Medical Decision Making/ A&P ? ?Patient with history of chronic alcoholism and substance abuse presenting with concerns over alcohol withdrawal, possible UTI, and swelling to her gluteal cleft. ? ?Patient does not appear to be actively withdrawing.  Her vital signs are normal with no tachycardia or hypertension.  She is not tremulous, and mental status is clear. ? ?UA does show evidence for a urinary tract infection and will be treated with Keflex. ? ?Patient also feels as though she needs to go to alcohol treatment.  She will be given outpatient resources to make these arrangements.  I have agreed to prescribe Librium to help with withdrawal symptoms. ? ?Final Clinical Impression(s) / ED Diagnoses ?Final diagnoses:  ?None  ? ? ?Rx / DC Orders ?ED Discharge Orders   ? ? None  ? ?  ? ? ?  ?Geoffery Lyons, MD ?10/09/21 [phone] ? ?

## 2021-10-09 NOTE — ED Notes (Addendum)
Pt NAD in bed, a/ox4, states she has many problems including "withdrawing from alcohol", s/p using cocaine tonight. Last drink 3-4 hours ago. -s/s Dts or withdrawal noted. Pt also c/o "bump on her butt", states she has not been taking coumadin for approximately 5 months for HX DVT last April, and finally she thinks she has a UTI. Denies dizziness, CP, SOB, ABD pain, n/v/d.  ?

## 2021-10-09 NOTE — ED Triage Notes (Signed)
Pt states that she is having alcohol withdrawals, last drink 3-4 hour ago. Cocaine use today. Also has a hx of DVT's, has not been taking her coumadin for about 5 months.  ?

## 2021-12-31 ENCOUNTER — Other Ambulatory Visit: Payer: Self-pay

## 2021-12-31 ENCOUNTER — Emergency Department (HOSPITAL_COMMUNITY)
Admission: EM | Admit: 2021-12-31 | Discharge: 2021-12-31 | Discharge status: Against Medical Advice | Payer: Medicaid Other | Attending: Emergency Medicine | Admitting: Emergency Medicine

## 2021-12-31 DIAGNOSIS — R300 Dysuria: Secondary | ICD-10-CM | POA: Insufficient documentation

## 2021-12-31 DIAGNOSIS — R110 Nausea: Secondary | ICD-10-CM | POA: Insufficient documentation

## 2021-12-31 DIAGNOSIS — M549 Dorsalgia, unspecified: Secondary | ICD-10-CM | POA: Insufficient documentation

## 2021-12-31 DIAGNOSIS — R1084 Generalized abdominal pain: Secondary | ICD-10-CM | POA: Insufficient documentation

## 2021-12-31 DIAGNOSIS — F22 Delusional disorders: Secondary | ICD-10-CM | POA: Insufficient documentation

## 2021-12-31 DIAGNOSIS — F419 Anxiety disorder, unspecified: Secondary | ICD-10-CM | POA: Insufficient documentation

## 2021-12-31 MED ORDER — PROCHLORPERAZINE EDISYLATE 10 MG/2ML IJ SOLN: 10.0000 mg | Freq: Once | INTRAMUSCULAR | Status: DC

## 2021-12-31 MED ORDER — LACTATED RINGERS IV BOLUS: 1000.0000 mL | Freq: Once | INTRAVENOUS | Status: DC
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
## 2021-12-31 NOTE — ED Notes (Signed)
Patient at nurses desk stating she is leaving. RN informed.

## 2021-12-31 NOTE — ED Notes (Signed)
Pt decided to leave AMA at this time. Dr.Dixon was at bedside when pt walked out of unit.
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
## 2022-01-05 ENCOUNTER — Other Ambulatory Visit: Payer: Self-pay

## 2022-01-05 ENCOUNTER — Emergency Department (HOSPITAL_COMMUNITY)
Admission: EM | Admit: 2022-01-05 | Discharge: 2022-01-06 | Discharge status: Against Medical Advice | Payer: Self-pay | Attending: Emergency Medicine | Admitting: Emergency Medicine

## 2022-01-05 ENCOUNTER — Encounter (HOSPITAL_COMMUNITY): Payer: Self-pay

## 2022-01-05 DIAGNOSIS — R102 Pelvic and perineal pain: Secondary | ICD-10-CM

## 2022-01-05 DIAGNOSIS — M25572 Pain in left ankle and joints of left foot: Secondary | ICD-10-CM | POA: Insufficient documentation

## 2022-01-05 DIAGNOSIS — R110 Nausea: Secondary | ICD-10-CM | POA: Insufficient documentation

## 2022-01-05 DIAGNOSIS — Z5329 Procedure and treatment not carried out because of patient's decision for other reasons: Secondary | ICD-10-CM | POA: Insufficient documentation

## 2022-01-05 DIAGNOSIS — N898 Other specified noninflammatory disorders of vagina: Secondary | ICD-10-CM | POA: Insufficient documentation

## 2022-01-05 DIAGNOSIS — M7989 Other specified soft tissue disorders: Secondary | ICD-10-CM | POA: Insufficient documentation

## 2022-01-05 DIAGNOSIS — M549 Dorsalgia, unspecified: Secondary | ICD-10-CM | POA: Insufficient documentation

## 2022-01-05 DIAGNOSIS — R0602 Shortness of breath: Secondary | ICD-10-CM | POA: Insufficient documentation

## 2022-01-05 MED ORDER — IOHEXOL 9 MG/ML PO SOLN
ORAL | Status: AC
  Filled 2022-01-05: qty 1000

## 2022-01-05 MED ORDER — IOHEXOL 9 MG/ML PO SOLN: 500.0000 mL | ORAL | Status: AC

## 2022-01-05 NOTE — ED Provider Triage Note (Cosign Needed)
Emergency Medicine Provider Triage Evaluation Note  [facility] , a 56 y.o. female  was evaluated in triage.  Pt complains of lower abdominal pains been ongoing for a week.  Patient states that she used crack cocaine around 8 AM this morning.  Last used a lot of meth yesterday.  Does report associated nausea and vomiting.  Also think she has UTI.  No fever or chills.  Review of Systems  Positive:  Negative: See above   Physical Exam  Ht 5\' 3"  (1.6 m)   Wt 54.4 kg   SpO2 99%   BMI 21.26 kg/m  Gen:   Awake, no distress, erratic Resp:  Normal effort  MSK:   Moves extremities without difficulty  Other:  Diffuse lower abdominal tenderness.  Medical Decision Making  Medically screening exam initiated at 7:05 PM.  Appropriate orders placed.  Kristina Owens was informed that the remainder of the evaluation will be completed by another provider, this initial triage assessment does not replace that evaluation, and the importance of remaining in the ED until their evaluation is complete.     [place] [facility], [place] 01/05/22 1906

## 2022-01-05 NOTE — ED Triage Notes (Addendum)
Per EMS- patient c/o left lower abdominal pain, lower back pain, and states she drank alcohol and had crack at 0800 this AM today.   Patient added- Patient states she had thick yellow vaginal drainage x a couple months and "I'm sure I have STD'S"

## 2022-01-06 ENCOUNTER — Emergency Department (HOSPITAL_COMMUNITY): Payer: Medicaid Other

## 2022-01-06 ENCOUNTER — Encounter (HOSPITAL_COMMUNITY): Payer: Self-pay

## 2022-01-06 ENCOUNTER — Emergency Department (HOSPITAL_COMMUNITY): Payer: Self-pay

## 2022-01-06 ENCOUNTER — Other Ambulatory Visit: Payer: Self-pay

## 2022-01-06 ENCOUNTER — Emergency Department (HOSPITAL_BASED_OUTPATIENT_CLINIC_OR_DEPARTMENT_OTHER): Admit: 2022-01-06 | Discharge: 2022-01-06 | Disposition: A | Discharge status: Home / Self Care | Payer: Self-pay

## 2022-01-06 ENCOUNTER — Emergency Department (HOSPITAL_COMMUNITY): Admission: RE | Admit: 2022-01-06 | Payer: Self-pay | Source: Ambulatory Visit

## 2022-01-06 ENCOUNTER — Emergency Department (HOSPITAL_COMMUNITY)
Admission: EM | Admit: 2022-01-06 | Discharge: 2022-01-07 | Disposition: A | Discharge status: Home / Self Care | Payer: Medicaid Other | Attending: Emergency Medicine | Admitting: Emergency Medicine

## 2022-01-06 DIAGNOSIS — I824Y2 Acute embolism and thrombosis of unspecified deep veins of left proximal lower extremity: Secondary | ICD-10-CM | POA: Insufficient documentation

## 2022-01-06 DIAGNOSIS — R102 Pelvic and perineal pain: Secondary | ICD-10-CM | POA: Insufficient documentation

## 2022-01-06 DIAGNOSIS — R0602 Shortness of breath: Secondary | ICD-10-CM | POA: Insufficient documentation

## 2022-01-06 DIAGNOSIS — R079 Chest pain, unspecified: Secondary | ICD-10-CM | POA: Insufficient documentation

## 2022-01-06 DIAGNOSIS — Y900 Blood alcohol level of less than 20 mg/100 ml: Secondary | ICD-10-CM | POA: Insufficient documentation

## 2022-01-06 DIAGNOSIS — M79605 Pain in left leg: Secondary | ICD-10-CM

## 2022-01-06 DIAGNOSIS — F17210 Nicotine dependence, cigarettes, uncomplicated: Secondary | ICD-10-CM | POA: Insufficient documentation

## 2022-01-06 LAB — COMPREHENSIVE METABOLIC PANEL
ALT: 23 U/L (ref 0–44)
AST: 22 U/L (ref 15–41)
Albumin: 3.6 g/dL (ref 3.5–5.0)
Alkaline Phosphatase: 110 U/L (ref 38–126)
Anion gap: 8 (ref 5–15)
BUN: 11 mg/dL (ref 6–20)
CO2: 25 mmol/L (ref 22–32)
Calcium: 8.9 mg/dL (ref 8.9–10.3)
Chloride: 109 mmol/L (ref 98–111)
Creatinine, Ser: 0.46 mg/dL (ref 0.44–1.00)
GFR, Estimated: >60 mL/min (ref >60)
Glucose, Bld: 75 mg/dL (ref 70–99)
Potassium: 3.7 mmol/L (ref 3.5–5.1)
Sodium: 142 mmol/L (ref 135–145)
Total Bilirubin: 0.7 mg/dL (ref 0.3–1.2)
Total Protein: 6.4 g/dL — ABNORMAL LOW (ref 6.5–8.1)

## 2022-01-06 LAB — CBC WITH DIFFERENTIAL/PLATELET
Abs Immature Granulocytes: 0.04 10*3/uL (ref 0.00–0.07)
Basophils Absolute: 0.1 10*3/uL (ref 0.0–0.1)
Basophils Relative: 1 %
Eosinophils Absolute: 0.4 10*3/uL (ref 0.0–0.5)
Eosinophils Relative: 5 %
HCT: 40.7 % (ref 36.0–46.0)
Hemoglobin: 13.7 g/dL (ref 12.0–15.0)
Immature Granulocytes: 1 %
Lymphocytes Relative: 51 %
Lymphs Abs: 4.6 10*3/uL — ABNORMAL HIGH (ref 0.7–4.0)
MCH: 30 pg (ref 26.0–34.0)
MCHC: 33.7 g/dL (ref 30.0–36.0)
MCV: 89.3 fL (ref 80.0–100.0)
Monocytes Absolute: 0.6 10*3/uL (ref 0.1–1.0)
Monocytes Relative: 7 %
Neutro Abs: 3.1 10*3/uL (ref 1.7–7.7)
Neutrophils Relative %: 35 %
Platelets: 256 10*3/uL (ref 150–400)
RBC: 4.56 MIL/uL (ref 3.87–5.11)
RDW: 13 % (ref 11.5–15.5)
WBC: 8.9 10*3/uL (ref 4.0–10.5)
nRBC: 0 % (ref 0.0–0.2)

## 2022-01-06 LAB — RPR: RPR Ser Ql: NONREACTIVE

## 2022-01-06 LAB — LIPASE, BLOOD: Lipase: 33 U/L (ref 11–51)

## 2022-01-06 LAB — HIV ANTIBODY (ROUTINE TESTING W REFLEX): HIV Screen 4th Generation wRfx: NONREACTIVE

## 2022-01-06 LAB — D-DIMER, QUANTITATIVE: D-Dimer, Quant: 0.34 ug/mL-FEU (ref 0.00–0.50)

## 2022-01-06 MED ORDER — MORPHINE SULFATE (PF) 4 MG/ML IV SOLN
4.0000 mg | Freq: Once | INTRAVENOUS | Status: AC
  Administered 2022-01-06: 4 mg via INTRAVENOUS
  Filled 2022-01-06: qty 1

## 2022-01-06 MED ORDER — IOHEXOL 300 MG/ML  SOLN: 100.0000 mL | Freq: Once | INTRAMUSCULAR | Status: DC | PRN

## 2022-01-06 MED ORDER — DOXYCYCLINE HYCLATE 100 MG PO CAPS: 100.0000 mg | ORAL_CAPSULE | Freq: Two times a day (BID) | ORAL | 0 refills | Status: DC

## 2022-01-06 MED ORDER — HYDROCODONE-ACETAMINOPHEN 5-325 MG PO TABS
1.0000 | ORAL_TABLET | Freq: Once | ORAL | Status: AC
  Administered 2022-01-06: 1 via ORAL
  Filled 2022-01-06: qty 1

## 2022-01-06 MED ORDER — ONDANSETRON HCL 4 MG/2ML IJ SOLN
4.0000 mg | Freq: Once | INTRAMUSCULAR | Status: AC
  Administered 2022-01-06: 4 mg via INTRAVENOUS
  Filled 2022-01-06: qty 2

## 2022-01-06 MED ORDER — LACTATED RINGERS IV BOLUS
1000.0000 mL | Freq: Once | INTRAVENOUS | Status: AC
  Administered 2022-01-06: 1000 mL via INTRAVENOUS

## 2022-01-06 MED ORDER — ONDANSETRON 8 MG PO TBDP
8.0000 mg | ORAL_TABLET | Freq: Once | ORAL | Status: AC
  Administered 2022-01-06: 8 mg via ORAL
  Filled 2022-01-06: qty 1

## 2022-01-06 MED ORDER — METRONIDAZOLE 500 MG PO TABS: 500.0000 mg | ORAL_TABLET | Freq: Two times a day (BID) | ORAL | 0 refills | Status: DC

## 2022-01-06 NOTE — Progress Notes (Signed)
Left lower extremity venous duplex has been completed. Preliminary results can be found in CV Proc through chart review.  Results were given to Delice Bison PA.  01/06/22 4:10 PM Olen Cordial RVT

## 2022-01-06 NOTE — ED Notes (Addendum)
Patient signed AMA paper electronically. Refused the pelvic exam, refused the CT because she wanted to sleep. Patient educated on importance of being further evaluated to find out what is going on and what is causing the abdominal pain. Patient said "I don't need all of this. Kristina Owens dont know what you are doing." Dr. Preston Fleeting at bedside telling patient the dangers of leaving and how her condition can worsen and how important the pelvic exam is. Patient said she just wants to sleep and wants some food.

## 2022-01-06 NOTE — ED Triage Notes (Signed)
Pt arrived via EMS, c/o left leg pain and lower pelvic pain. Seen for same yesterday.

## 2022-01-06 NOTE — ED Provider Notes (Signed)
[facility] DEPT Provider Note   CSN: [id number] Arrival date & time: 01/05/22  1737     History  Chief Complaint  Patient presents with   Abdominal Pain   Back Pain   Foot Pain    Kristina Owens is a 56 y.o. female.  The history is provided by the patient.  Abdominal Pain Back Pain Associated symptoms: abdominal pain   Foot Pain Associated symptoms include abdominal pain.  She has history of bipolar disorder, pulmonary embolism not on ongoing anticoagulation, polysubstance abuse and comes in complaining of pain and swelling in her left ankle and pain in her lower abdomen.  She states that her ankle has been bothering her for about the last week, and lower abdomen for about the last month.  She is concerned because she does have history of DVT.  She also states she is concerned about possible sexually transmitted infection.  She has had a yellow vaginal discharge which is not malodorous.  Lower abdominal pain has been present for about a month and has been getting worse.  She denies fever or chills.  She has had some mild nausea but no vomiting.  She denies any urinary difficulty and denies constipation or diarrhea.  She denies chest pain, but does endorse some shortness of breath.   Home Medications Prior to Admission medications   Medication Sig Start Date End Date Taking? Authorizing Provider  cephALEXin (KEFLEX) 500 MG capsule Take 1 capsule (500 mg total) by mouth 3 (three) times daily. 10/09/21   Veryl Speak, MD  chlordiazePOXIDE (LIBRIUM) 25 MG capsule 50mg  PO TID x 1D, then 25-50mg  PO BID X 1D, then 25-50mg  PO QD X 1D 10/09/21   Veryl Speak, MD      Allergies    Patient has no known allergies.    Review of Systems   Review of Systems  Gastrointestinal:  Positive for abdominal pain.  Musculoskeletal:  Positive for back pain.  All other systems reviewed and are negative.  Physical Exam Updated Vital Signs BP 117/66   Pulse 61   Temp  (!) 97.5 F (36.4 C) (Oral)   Resp 17   Ht 5\' 3"  (1.6 m)   Wt 54.4 kg   SpO2 100%   BMI 21.26 kg/m  Physical Exam Vitals and nursing note reviewed.  56 year old female, resting comfortably and in no acute distress. Vital signs are normal. Oxygen saturation is 100%, which is normal. Head is normocephalic and atraumatic. PERRLA, EOMI. Oropharynx is clear. Neck is nontender and supple without adenopathy or JVD. Back is nontender and there is no CVA tenderness. Lungs are clear without rales, wheezes, or rhonchi. Chest is nontender. Heart has regular rate and rhythm without murmur. Abdomen is soft, flat, with moderate suprapubic tenderness.  There is no rebound or guarding.  Peristalsis is hypoactive. Pelvic: Refused. Extremities: There is no swelling noted of the left ankle or lower leg.  She withdraws whenever I tried to examine it, but I can not detect any cords and there is no erythema or warmth. Skin is warm and dry without rash. Neurologic: Mental status is normal, cranial nerves are intact, moves all extremities equally.  ED Results / Procedures / Treatments   Labs (all labs ordered are listed, but only abnormal results are displayed) Labs Reviewed  COMPREHENSIVE METABOLIC PANEL - Abnormal; Notable for the following components:      Result Value   Total Protein 6.4 (*)    All other components within normal  limits  CBC WITH DIFFERENTIAL/PLATELET - Abnormal; Notable for the following components:   Lymphs Abs 4.6 (*)    All other components within normal limits  LIPASE, BLOOD  D-DIMER, QUANTITATIVE  RPR  URINALYSIS, ROUTINE W REFLEX MICROSCOPIC  HIV ANTIBODY (ROUTINE TESTING W REFLEX)  GC/CHLAMYDIA PROBE AMP ([place]) NOT AT [facility]    EKG None  Radiology No results found.  Procedures Procedures    Medications Ordered in ED Medications  iohexol (OMNIPAQUE) 9 MG/ML oral solution 500 mL ( Oral Canceled Entry 01/06/22 0246)  iohexol (OMNIPAQUE) 9 MG/ML oral solution  (  Canceled Entry 01/06/22 0245)    ED Course/ Medical Decision Making/ A&P                           Medical Decision Making Amount and/or Complexity of Data Reviewed Labs: ordered. Radiology: ordered.  Risk Prescription drug management.   Pelvic pain concerning for pelvic inflammatory disease.  Differential diagnosis includes, but is not limited to, urinary tract infection, urolithiasis, pyelonephritis, diverticulitis, appendicitis.  Left ankle pain of uncertain cause, but no physical findings concerning for DVT.  We will need to send for venous Doppler to rule out DVT, will also check x-rays of the ankle to rule out fracture.  I have reviewed and interpreted all of her laboratory tests, CBC and metabolic panel and lipase are all normal, urinalysis pending.  Sexually transmitted infection panel has been ordered" and also D-dimer to help rule out pulmonary embolism and DVT.  Old records are reviewed, and she had been admitted to the hospital on 01/08/2021 for pulmonary embolism including saddle embolism, and DVT, had interventional radiology venous thrombectomy on 01/16/2021.  D-dimer is normal.  Patient is refusing pelvic exam, refusing CT scan.  I have explained to the patient that I cannot adequately diagnose her condition without the is tests, but she is still refusing.  Therefore, decision was made to treat empirically for pelvic inflammatory disease.  I informed the patient that she would be given an injection of ceftriaxone as well as oral doxycycline and metronidazole.  She is refusing those as well.  I have explained the risk of chronic pelvic pain from inadequately treated pelvic inflammatory disease, she acknowledges this and still refuses the medications.  She states that she [place] ceftriaxone in the jail and that it does not help her.  I have requesting that she return for a venous Doppler to make sure she does not have DVT, this has been ordered.  I have also sent in prescriptions for  doxycycline and metronidazole and recommended to the patient that she follow-up with the women's outpatient clinic.  She is advised that she is welcome to return at any time if she is willing to accept the intramuscular ceftriaxone and additional testing.  Final Clinical Impression(s) / ED Diagnoses Final diagnoses:  Acute pelvic pain, female  Acute left ankle pain    Rx / DC Orders ED Discharge Orders          Ordered    doxycycline (VIBRAMYCIN) 100 MG capsule  2 times daily        01/06/22 0520    metroNIDAZOLE (FLAGYL) 500 MG tablet  2 times daily        01/06/22 0520    VAS [place] LOWER EXTREMITY VENOUS (DVT)        01/06/22 [id number]              Delora Fuel, MD  01/06/22 0531  

## 2022-01-06 NOTE — ED Notes (Signed)
Patient refusing CT scan, pelvic exam, more blood work until Dr. Roxanne Mins goes into the room and talks to the patient. Patient demanding pain medication. Dr. Roxanne Mins notified.

## 2022-01-06 NOTE — Discharge Instructions (Addendum)
You have refused aspects of your treatment in the emergency department including CT of abdomen and pelvis, pelvic exam, intramuscular antibiotics.  Without the test, I cannot be 100% sure what is causing your pain, but I am very concerned about a pelvic infection called pelvic inflammatory disease.  Treatment of the pelvic inflammatory disease does require an intramuscular injection.  You are being given prescriptions for antibiotics, but they may not be sufficient.  Inadequately treated pelvic inflammatory disease can lead to chronic pelvic pain.  If you change your mind about any of the testing or the antibiotic, please either return to the emergency department or go to the [facility] outpatient clinic.  Your blood test to look for blood clots in the lung was normal, but I cannot be sure you do not have a blood clot in your leg without a Doppler test.  Please return for the Doppler test to make sure you do not have a blood clot in your leg.

## 2022-01-06 NOTE — ED Notes (Signed)
Dr. Preston Fleeting said it was okay to give the morphine despite patients BP of 99/58.

## 2022-01-06 NOTE — ED Provider Triage Note (Signed)
Emergency Medicine Provider Triage Evaluation Note  [name] , a 56 y.o. female  was evaluated in triage.  Pt complains of shortness of breath, lower abdominal pain and vaginal discharge as well as left foot pain.  Patient states she has been short of breath for the last 2 to 3 days, had vaginal discharge for the last 1 month, had foot pain for the last 1 week.  Patient was seen in this ED last night however did not have a complete work-up because according to the previous provider note she refused majority of the tests and studies.  Patient requesting pain medication and antinausea medication in triage.  Review of Systems  Positive:  Negative:   Physical Exam  Temp 98.4 F (36.9 C) (Oral)   Resp 16   Ht 5\' 3"  (1.6 m)   Wt 54.4 kg   SpO2 96%   BMI 21.26 kg/m  Gen:   Awake, no distress   Resp:  Normal effort  MSK:   Moves extremities without difficulty  Other:  Left lower extremity slightly erythematous.  Lung sounds distant.  Lower abdominal tenderness on palpation.  Medical Decision Making  Medically screening exam initiated at 3:48 PM.  Appropriate orders placed.  Kristina Owens was informed that the remainder of the evaluation will be completed by another provider, this initial triage assessment does not replace that evaluation, and the importance of remaining in the ED until their evaluation is complete.     Azucena Cecil, PA-C 01/06/22 1549

## 2022-01-06 NOTE — ED Notes (Signed)
Patient refusing CT scan and pelvic. Saying she needs to sleep. Dr. Roxanne Mins notified.

## 2022-01-06 NOTE — ED Notes (Signed)
Patient said she cannot provide a urine sample now. She is requesting pain medication.

## 2022-01-07 ENCOUNTER — Emergency Department (HOSPITAL_COMMUNITY): Payer: Medicaid Other

## 2022-01-07 ENCOUNTER — Encounter (HOSPITAL_COMMUNITY): Payer: Self-pay

## 2022-01-07 ENCOUNTER — Other Ambulatory Visit (HOSPITAL_COMMUNITY): Payer: Self-pay

## 2022-01-07 LAB — CBC
HCT: 38.8 % (ref 36.0–46.0)
Hemoglobin: 13 g/dL (ref 12.0–15.0)
MCH: 30.4 pg (ref 26.0–34.0)
MCHC: 33.5 g/dL (ref 30.0–36.0)
MCV: 90.9 fL (ref 80.0–100.0)
Platelets: 230 10*3/uL (ref 150–400)
RBC: 4.27 MIL/uL (ref 3.87–5.11)
RDW: 13.3 % (ref 11.5–15.5)
WBC: 7.6 10*3/uL (ref 4.0–10.5)
nRBC: 0 % (ref 0.0–0.2)

## 2022-01-07 LAB — URINALYSIS, ROUTINE W REFLEX MICROSCOPIC
Bacteria, UA: NONE SEEN
Bilirubin Urine: NEGATIVE
Glucose, UA: NEGATIVE mg/dL
Hgb urine dipstick: NEGATIVE
Ketones, ur: NEGATIVE mg/dL
Nitrite: NEGATIVE
Protein, ur: NEGATIVE mg/dL
Specific Gravity, Urine: 1.045 — ABNORMAL HIGH (ref 1.005–1.030)
pH: 6 (ref 5.0–8.0)

## 2022-01-07 LAB — COMPREHENSIVE METABOLIC PANEL
ALT: 19 U/L (ref 0–44)
AST: 19 U/L (ref 15–41)
Albumin: 3.6 g/dL (ref 3.5–5.0)
Alkaline Phosphatase: 121 U/L (ref 38–126)
Anion gap: 6 (ref 5–15)
BUN: 13 mg/dL (ref 6–20)
CO2: 29 mmol/L (ref 22–32)
Calcium: 8.9 mg/dL (ref 8.9–10.3)
Chloride: 107 mmol/L (ref 98–111)
Creatinine, Ser: 0.49 mg/dL (ref 0.44–1.00)
GFR, Estimated: >60 mL/min (ref >60)
Glucose, Bld: 92 mg/dL (ref 70–99)
Potassium: 3.9 mmol/L (ref 3.5–5.1)
Sodium: 142 mmol/L (ref 135–145)
Total Bilirubin: 0.4 mg/dL (ref 0.3–1.2)
Total Protein: 6.4 g/dL — ABNORMAL LOW (ref 6.5–8.1)

## 2022-01-07 LAB — ETHANOL: Alcohol, Ethyl (B): <10 mg/dL (ref <10)

## 2022-01-07 LAB — TROPONIN I (HIGH SENSITIVITY): Troponin I (High Sensitivity): <2 ng/L (ref <18)

## 2022-01-07 MED ORDER — IOHEXOL 350 MG/ML SOLN
100.0000 mL | Freq: Once | INTRAVENOUS | Status: AC | PRN
  Administered 2022-01-07: 100 mL via INTRAVENOUS

## 2022-01-07 MED ORDER — RIVAROXABAN 15 MG PO TABS
15.0000 mg | ORAL_TABLET | Freq: Once | ORAL | Status: DC
  Filled 2022-01-07: qty 1

## 2022-01-07 MED ORDER — FENTANYL CITRATE PF 50 MCG/ML IJ SOSY
50.0000 ug | PREFILLED_SYRINGE | Freq: Once | INTRAMUSCULAR | Status: AC
  Administered 2022-01-07: 50 ug via INTRAVENOUS
  Filled 2022-01-07: qty 1

## 2022-01-07 MED ORDER — ONDANSETRON 4 MG PO TBDP
4.0000 mg | ORAL_TABLET | Freq: Once | ORAL | Status: AC
  Administered 2022-01-07: 4 mg via ORAL
  Filled 2022-01-07: qty 1

## 2022-01-07 MED ORDER — SODIUM CHLORIDE 0.9 % IV BOLUS
1000.0000 mL | Freq: Once | INTRAVENOUS | Status: AC
  Administered 2022-01-07: 1000 mL via INTRAVENOUS

## 2022-01-07 MED ORDER — RIVAROXABAN (XARELTO) VTE STARTER PACK (15 & 20 MG): ORAL_TABLET | ORAL | 0 refills | Status: DC

## 2022-01-07 MED ORDER — ONDANSETRON HCL 4 MG/2ML IJ SOLN
4.0000 mg | Freq: Once | INTRAMUSCULAR | Status: AC
  Administered 2022-01-07: 4 mg via INTRAVENOUS
  Filled 2022-01-07: qty 2

## 2022-01-07 MED ORDER — IBUPROFEN 200 MG PO TABS
600.0000 mg | ORAL_TABLET | Freq: Once | ORAL | Status: AC
  Administered 2022-01-07: 600 mg via ORAL
  Filled 2022-01-07: qty 3

## 2022-01-07 MED ORDER — RIVAROXABAN (XARELTO) VTE STARTER PACK (15 & 20 MG)
ORAL_TABLET | ORAL | 0 refills | Status: DC
  Filled 2022-01-07: qty 51, 30d supply, fill #0
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
## 2022-01-07 NOTE — ED Notes (Signed)
Pt came out of room abruptly and asked that her IV be removed so she can leave. Pt also reports refusing to get Xarelto prescription. MD made aware.

## 2022-01-07 NOTE — Discharge Instructions (Addendum)
Information on my medicine - XARELTO (rivaroxaban)  This medication education was reviewed with me or my healthcare representative as part of my discharge preparation.    WHY WAS XARELTO PRESCRIBED FOR YOU? Xarelto was prescribed to treat blood clots that may have been found in the veins of your legs (deep vein thrombosis) or in your lungs (pulmonary embolism) and to reduce the risk of them occurring again.  What do you need to know about Xarelto? The starting dose is one 15 mg tablet taken TWICE daily with food for the FIRST 21 DAYS then on 01/28/2022  the dose is changed to one 20 mg tablet taken ONCE A DAY with your evening meal.  DO NOT stop taking Xarelto without talking to the health care provider who prescribed the medication.  Refill your prescription for 20 mg tablets before you run out.  After discharge, you should have regular check-up appointments with your healthcare provider that is prescribing your Xarelto.  In the future your dose may need to be changed if your kidney function changes by a significant amount.  What do you do if you miss a dose? If you are taking Xarelto TWICE DAILY and you miss a dose, take it as soon as you remember. You may take two 15 mg tablets (total 30 mg) at the same time then resume your regularly scheduled 15 mg twice daily the next day.  If you are taking Xarelto ONCE DAILY and you miss a dose, take it as soon as you remember on the same day then continue your regularly scheduled once daily regimen the next day. Do not take two doses of Xarelto at the same time.   Important Safety Information Xarelto is a blood thinner medicine that can cause bleeding. You should call your healthcare provider right away if you experience any of the following: Bleeding from an injury or your nose that does not stop. Unusual colored urine (red or dark brown) or unusual colored stools (red or black). Unusual bruising for unknown reasons. A serious fall or if  you hit your head (even if there is no bleeding).  Some medicines may interact with Xarelto and might increase your risk of bleeding while on Xarelto. To help avoid this, consult your healthcare provider or pharmacist prior to using any new prescription or non-prescription medications, including herbals, vitamins, non-steroidal anti-inflammatory drugs (NSAIDs) and supplements.  This website has more information on Xarelto: VisitDestination.com.br.

## 2022-01-07 NOTE — Progress Notes (Signed)
Pharmacy Brief Note:  Patient's discharge prescription for Xarelto starter pack for DVT was dispensed through [facility] at no charge to patient using manufacturer coupon.  Hand-delivered prescription to patient in [facility] ED Bed #17 and provided medication teaching.  Updated patient's RN.  Elie Goody, PharmD, BCPS [facility] [phone] 01/07/2022  9:05 AM

## 2022-01-08 ENCOUNTER — Other Ambulatory Visit: Payer: Self-pay

## 2022-01-08 ENCOUNTER — Encounter (HOSPITAL_COMMUNITY): Payer: Self-pay

## 2022-01-08 ENCOUNTER — Emergency Department (HOSPITAL_COMMUNITY)
Admission: EM | Admit: 2022-01-08 | Discharge: 2022-01-08 | Discharge status: Against Medical Advice | Payer: Medicaid Other | Attending: Emergency Medicine | Admitting: Emergency Medicine

## 2022-01-08 DIAGNOSIS — Z5321 Procedure and treatment not carried out due to patient leaving prior to being seen by health care provider: Secondary | ICD-10-CM | POA: Insufficient documentation

## 2022-01-08 DIAGNOSIS — R0602 Shortness of breath: Secondary | ICD-10-CM | POA: Insufficient documentation

## 2022-01-08 DIAGNOSIS — M79605 Pain in left leg: Secondary | ICD-10-CM | POA: Insufficient documentation

## 2022-01-08 DIAGNOSIS — M25473 Effusion, unspecified ankle: Secondary | ICD-10-CM | POA: Insufficient documentation

## 2022-01-08 DIAGNOSIS — Z5900 Homelessness unspecified: Secondary | ICD-10-CM | POA: Insufficient documentation

## 2022-01-08 NOTE — ED Notes (Signed)
Pt called for vital re-check with no response  

## 2022-01-08 NOTE — ED Triage Notes (Signed)
Pt BIB EMS [place]. Pt reports left leg pain x2 weeks. Pt was diagnosed with DVT 2 days ago. Pt reports taking blood thinner that was prescribed, but endorses continued pain and ankle swelling.

## 2022-01-08 NOTE — ED Provider Triage Note (Signed)
Emergency Medicine Provider Triage Evaluation Note  [facility] , a 56 y.o. female  was evaluated in triage.  Pt complains of left leg pain and intermittent shortness of breath times the past 2 weeks.  Patient was diagnosed yesterday with a DVT of the lower left extremity.  Patient is currently taking Xarelto for the DVT.  Patient states that approximately 1 year ago she had surgery on the lower left extremity due to blood clots.  She has been taking Coumadin but stopped due to a relapse and alcoholism.  Patient states that she is homeless and has a hard time maintaining.  Endorses shortness of breath, no worse than previous, left leg pain and swelling.  Denies chest pain, abdominal pain.  Patient had CT angio PE study yesterday which shows no sign of PE.  DVT was confirmed.  Review of Systems  Positive: Shortness of breath, left leg pain Negative: Chest pain  Physical Exam  BP 110/66 (BP Location: Left Arm)   Pulse 85   Temp 98.2 F (36.8 C) (Oral)   Resp 16   SpO2 100%  Gen:   Awake, no distress   Resp:  Normal effort  MSK:   Moves extremities without difficulty  Other:    Medical Decision Making  Medically screening exam initiated at 3:10 PM.  Appropriate orders placed.  [place] was informed that the remainder of the evaluation will be completed by another provider, this initial triage assessment does not replace that evaluation, and the importance of remaining in the ED until their evaluation is complete.     Darrick Grinder, PA-C 01/08/22 1513

## 2022-01-11 ENCOUNTER — Encounter (HOSPITAL_COMMUNITY): Payer: Self-pay | Admitting: Emergency Medicine

## 2022-01-11 ENCOUNTER — Emergency Department (HOSPITAL_COMMUNITY)
Admission: EM | Admit: 2022-01-11 | Discharge: 2022-01-11 | Disposition: A | Discharge status: Home / Self Care | Payer: Medicaid Other

## 2022-01-11 ENCOUNTER — Emergency Department (HOSPITAL_COMMUNITY): Payer: Medicaid Other

## 2022-01-11 ENCOUNTER — Other Ambulatory Visit: Payer: Self-pay

## 2022-01-11 ENCOUNTER — Emergency Department (HOSPITAL_COMMUNITY)
Admission: EM | Admit: 2022-01-11 | Discharge: 2022-01-12 | Disposition: A | Discharge status: Home / Self Care | Payer: Medicaid Other | Attending: Emergency Medicine | Admitting: Emergency Medicine

## 2022-01-11 DIAGNOSIS — Z5321 Procedure and treatment not carried out due to patient leaving prior to being seen by health care provider: Secondary | ICD-10-CM | POA: Insufficient documentation

## 2022-01-11 DIAGNOSIS — R079 Chest pain, unspecified: Secondary | ICD-10-CM | POA: Insufficient documentation

## 2022-01-11 LAB — CBC
HCT: 40.2 % (ref 36.0–46.0)
Hemoglobin: 13.8 g/dL (ref 12.0–15.0)
MCH: 30.3 pg (ref 26.0–34.0)
MCHC: 34.3 g/dL (ref 30.0–36.0)
MCV: 88.2 fL (ref 80.0–100.0)
Platelets: 311 10*3/uL (ref 150–400)
RBC: 4.56 MIL/uL (ref 3.87–5.11)
RDW: 13.3 % (ref 11.5–15.5)
WBC: 10.8 10*3/uL — ABNORMAL HIGH (ref 4.0–10.5)
nRBC: 0 % (ref 0.0–0.2)

## 2022-01-11 NOTE — ED Triage Notes (Signed)
Pt brought to ED by [facility] with c/o chest pain. Pt states that she was here earlier and left to go get cheese crackers. Pt also endorses ETOH usage prior to returning. Pt endorses hx of anxiety.    EMS Vitals  Bp 106/70 HR 112 RR 16 SPO2 98%

## 2022-01-11 NOTE — ED Notes (Signed)
Called pt 4 times for triage with no answer

## 2022-01-12 LAB — BASIC METABOLIC PANEL
Anion gap: 13 (ref 5–15)
BUN: 11 mg/dL (ref 6–20)
CO2: 24 mmol/L (ref 22–32)
Calcium: 8.9 mg/dL (ref 8.9–10.3)
Chloride: 101 mmol/L (ref 98–111)
Creatinine, Ser: 0.67 mg/dL (ref 0.44–1.00)
GFR, Estimated: >60 mL/min (ref >60)
Glucose, Bld: 120 mg/dL — ABNORMAL HIGH (ref 70–99)
Potassium: 3.2 mmol/L — ABNORMAL LOW (ref 3.5–5.1)
Sodium: 138 mmol/L (ref 135–145)

## 2022-01-12 LAB — TROPONIN I (HIGH SENSITIVITY): Troponin I (High Sensitivity): 4 ng/L (ref <18)

## 2022-01-12 NOTE — ED Notes (Signed)
X1 no response 

## 2022-01-13 ENCOUNTER — Emergency Department (HOSPITAL_COMMUNITY)
Admission: EM | Admit: 2022-01-13 | Discharge: 2022-01-14 | Discharge status: Against Medical Advice | Payer: Medicaid Other | Attending: Emergency Medicine | Admitting: Emergency Medicine

## 2022-01-13 ENCOUNTER — Emergency Department (HOSPITAL_COMMUNITY)
Admission: EM | Admit: 2022-01-13 | Discharge: 2022-01-13 | Discharge status: Against Medical Advice | Payer: Medicaid Other | Attending: Emergency Medicine | Admitting: Emergency Medicine

## 2022-01-13 ENCOUNTER — Encounter (HOSPITAL_COMMUNITY): Payer: Self-pay

## 2022-01-13 ENCOUNTER — Emergency Department (HOSPITAL_COMMUNITY): Payer: Medicaid Other

## 2022-01-13 ENCOUNTER — Other Ambulatory Visit: Payer: Self-pay

## 2022-01-13 DIAGNOSIS — M79671 Pain in right foot: Secondary | ICD-10-CM | POA: Insufficient documentation

## 2022-01-13 DIAGNOSIS — Z5321 Procedure and treatment not carried out due to patient leaving prior to being seen by health care provider: Secondary | ICD-10-CM | POA: Insufficient documentation

## 2022-01-13 DIAGNOSIS — F1010 Alcohol abuse, uncomplicated: Secondary | ICD-10-CM | POA: Insufficient documentation

## 2022-01-13 DIAGNOSIS — R079 Chest pain, unspecified: Secondary | ICD-10-CM | POA: Insufficient documentation

## 2022-01-13 DIAGNOSIS — M79672 Pain in left foot: Secondary | ICD-10-CM | POA: Insufficient documentation

## 2022-01-13 DIAGNOSIS — R0602 Shortness of breath: Secondary | ICD-10-CM | POA: Insufficient documentation

## 2022-01-13 DIAGNOSIS — Y909 Presence of alcohol in blood, level not specified: Secondary | ICD-10-CM | POA: Insufficient documentation

## 2022-01-13 LAB — COMPREHENSIVE METABOLIC PANEL WITH GFR
ALT: 21 U/L (ref 0–44)
AST: 22 U/L (ref 15–41)
Albumin: 3.7 g/dL (ref 3.5–5.0)
Alkaline Phosphatase: 97 U/L (ref 38–126)
Anion gap: 9 (ref 5–15)
BUN: 9 mg/dL (ref 6–20)
CO2: 26 mmol/L (ref 22–32)
Calcium: 8.6 mg/dL — ABNORMAL LOW (ref 8.9–10.3)
Chloride: 103 mmol/L (ref 98–111)
Creatinine, Ser: 0.68 mg/dL (ref 0.44–1.00)
GFR, Estimated: >60 mL/min
Glucose, Bld: 90 mg/dL (ref 70–99)
Potassium: 3.2 mmol/L — ABNORMAL LOW (ref 3.5–5.1)
Sodium: 138 mmol/L (ref 135–145)
Total Bilirubin: 0.7 mg/dL (ref 0.3–1.2)
Total Protein: 6.5 g/dL (ref 6.5–8.1)

## 2022-01-13 LAB — CBC WITH DIFFERENTIAL/PLATELET
Abs Immature Granulocytes: 0.05 10*3/uL (ref 0.00–0.07)
Basophils Absolute: 0 10*3/uL (ref 0.0–0.1)
Basophils Relative: 1 %
Eosinophils Absolute: 0.2 10*3/uL (ref 0.0–0.5)
Eosinophils Relative: 3 %
HCT: 38.2 % (ref 36.0–46.0)
Hemoglobin: 12.7 g/dL (ref 12.0–15.0)
Immature Granulocytes: 1 %
Lymphocytes Relative: 43 %
Lymphs Abs: 3 10*3/uL (ref 0.7–4.0)
MCH: 29.7 pg (ref 26.0–34.0)
MCHC: 33.2 g/dL (ref 30.0–36.0)
MCV: 89.3 fL (ref 80.0–100.0)
Monocytes Absolute: 0.7 10*3/uL (ref 0.1–1.0)
Monocytes Relative: 10 %
Neutro Abs: 2.8 10*3/uL (ref 1.7–7.7)
Neutrophils Relative %: 42 %
Platelets: 294 10*3/uL (ref 150–400)
RBC: 4.28 MIL/uL (ref 3.87–5.11)
RDW: 13.4 % (ref 11.5–15.5)
WBC: 6.8 10*3/uL (ref 4.0–10.5)
nRBC: 0 % (ref 0.0–0.2)

## 2022-01-13 LAB — CBC
HCT: 42.5 % (ref 36.0–46.0)
Hemoglobin: 14.6 g/dL (ref 12.0–15.0)
MCH: 31.1 pg (ref 26.0–34.0)
MCHC: 34.4 g/dL (ref 30.0–36.0)
MCV: 90.4 fL (ref 80.0–100.0)
Platelets: 304 10*3/uL (ref 150–400)
RBC: 4.7 MIL/uL (ref 3.87–5.11)
RDW: 13.3 % (ref 11.5–15.5)
WBC: 8.2 10*3/uL (ref 4.0–10.5)
nRBC: 0 % (ref 0.0–0.2)

## 2022-01-13 LAB — BASIC METABOLIC PANEL WITH GFR
Anion gap: 7 (ref 5–15)
BUN: 5 mg/dL — ABNORMAL LOW (ref 6–20)
CO2: 30 mmol/L (ref 22–32)
Calcium: 8.6 mg/dL — ABNORMAL LOW (ref 8.9–10.3)
Chloride: 104 mmol/L (ref 98–111)
Creatinine, Ser: 0.6 mg/dL (ref 0.44–1.00)
GFR, Estimated: >60 mL/min
Glucose, Bld: 120 mg/dL — ABNORMAL HIGH (ref 70–99)
Potassium: 3.9 mmol/L (ref 3.5–5.1)
Sodium: 141 mmol/L (ref 135–145)

## 2022-01-13 LAB — TROPONIN I (HIGH SENSITIVITY)
Troponin I (High Sensitivity): 3 ng/L (ref <18)
Troponin I (High Sensitivity): 3 ng/L

## 2022-01-13 LAB — I-STAT BETA HCG BLOOD, ED (MC, WL, AP ONLY): I-stat hCG, quantitative: <5 m[IU]/mL (ref <5)
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
## 2022-01-13 NOTE — ED Provider Triage Note (Signed)
  Emergency Medicine Provider Triage Evaluation Note  MRN:  [id number]  Arrival date & time: 01/13/22    Medically screening exam initiated at 3:06 AM.   CC:   Chest pain  HPI:  Kristina Owens is a 56 y.o. year-old female presents to the ED with chief complaint of chest pain.  Has been intermittent for the past few days.  Worsened tonight.  Intermittent SOB.  Denies fever or chills.  History provided by History provided by patient. ROS:  -As included in HPI PE:   Vitals:   01/13/22 0305  BP: (!) 151/106  Pulse: 88  Resp: 18  Temp: 97.9 F (36.6 C)  SpO2: 98%    Non-toxic appearing No respiratory distress  MDM:  Based on signs and symptoms, ACS is highest on my differential. I've ordered labs and imaging in triage to expedite lab/diagnostic workup.  Patient was informed that the remainder of the evaluation will be completed by another provider, this initial triage assessment does not replace that evaluation, and the importance of remaining in the ED until their evaluation is complete.    Roxy Horseman, PA-C 01/13/22 [phone]

## 2022-01-13 NOTE — ED Triage Notes (Addendum)
Arrives via security after being found in a restricted area of the hospital.   Pt sts multiple complaints. While eating several snacks at once pt says she's here for a hx of dvt's and has chest pains radiating everywhere.

## 2022-01-13 NOTE — ED Notes (Signed)
Pt refuses XR saying she isn't worried about her chest. Calls staff an idiot and request help picking up her belongings.

## 2022-01-13 NOTE — ED Triage Notes (Signed)
Pt found laying by a stop sign on [address], drunk. No known injury but brought to hospital by EMS. Endorses drinking 1.5 [employer] today and denies drug use to me, although admitted to drug use to EMS. LWBS today from this ED after she was brought to the ED after wandering through the west side of the hospital.

## 2022-01-13 NOTE — ED Notes (Signed)
Patient belongings found in a chair. Patient has eloped from the emergency room. Patient was found earlier wandering through the west side of the hospital and security brought her here. Security notified that patient has eloped again.

## 2022-01-14 NOTE — ED Notes (Signed)
Called pts name 3x to be roomed with no response. Pt not seen in lobby at this time.  

## 2022-01-17 ENCOUNTER — Other Ambulatory Visit: Payer: Self-pay

## 2022-01-17 ENCOUNTER — Encounter (HOSPITAL_COMMUNITY): Payer: Self-pay

## 2022-01-17 ENCOUNTER — Emergency Department (HOSPITAL_COMMUNITY)
Admission: EM | Admit: 2022-01-17 | Discharge: 2022-01-18 | Disposition: A | Discharge status: Home / Self Care | Payer: Medicaid Other | Attending: Emergency Medicine | Admitting: Emergency Medicine

## 2022-01-17 DIAGNOSIS — Z5900 Homelessness unspecified: Secondary | ICD-10-CM

## 2022-01-17 DIAGNOSIS — I825Z2 Chronic embolism and thrombosis of unspecified deep veins of left distal lower extremity: Secondary | ICD-10-CM

## 2022-01-17 DIAGNOSIS — R102 Pelvic and perineal pain: Secondary | ICD-10-CM | POA: Insufficient documentation

## 2022-01-17 DIAGNOSIS — F1999 Other psychoactive substance use, unspecified with unspecified psychoactive substance-induced disorder: Secondary | ICD-10-CM | POA: Diagnosis present

## 2022-01-17 DIAGNOSIS — N3001 Acute cystitis with hematuria: Secondary | ICD-10-CM

## 2022-01-17 DIAGNOSIS — F1920 Other psychoactive substance dependence, uncomplicated: Secondary | ICD-10-CM | POA: Diagnosis present

## 2022-01-17 DIAGNOSIS — F411 Generalized anxiety disorder: Secondary | ICD-10-CM | POA: Diagnosis present

## 2022-01-17 DIAGNOSIS — F1420 Cocaine dependence, uncomplicated: Secondary | ICD-10-CM | POA: Diagnosis present

## 2022-01-17 DIAGNOSIS — Z7901 Long term (current) use of anticoagulants: Secondary | ICD-10-CM | POA: Insufficient documentation

## 2022-01-17 DIAGNOSIS — F4323 Adjustment disorder with mixed anxiety and depressed mood: Secondary | ICD-10-CM | POA: Diagnosis present

## 2022-01-17 DIAGNOSIS — T148XXA Other injury of unspecified body region, initial encounter: Secondary | ICD-10-CM

## 2022-01-17 NOTE — ED Triage Notes (Signed)
Patient has had abdominal pain for 2 days. Pain is in the lower abdomen. No vomiting or diarrhea.

## 2022-01-18 DIAGNOSIS — F4323 Adjustment disorder with mixed anxiety and depressed mood: Secondary | ICD-10-CM | POA: Diagnosis present

## 2022-01-18 LAB — RAPID HIV SCREEN (HIV 1/2 AB+AG)
HIV 1/2 Antibodies: NONREACTIVE
HIV-1 P24 Antigen - HIV24: NONREACTIVE

## 2022-01-18 LAB — CBC
HCT: 43.3 % (ref 36.0–46.0)
Hemoglobin: 14.3 g/dL (ref 12.0–15.0)
MCH: 30.6 pg (ref 26.0–34.0)
MCHC: 33 g/dL (ref 30.0–36.0)
MCV: 92.7 fL (ref 80.0–100.0)
Platelets: 282 10*3/uL (ref 150–400)
RBC: 4.67 MIL/uL (ref 3.87–5.11)
RDW: 14.3 % (ref 11.5–15.5)
WBC: 7.6 10*3/uL (ref 4.0–10.5)
nRBC: 0 % (ref 0.0–0.2)

## 2022-01-18 LAB — LIPASE, BLOOD: Lipase: 36 U/L (ref 11–51)

## 2022-01-18 LAB — COMPREHENSIVE METABOLIC PANEL
ALT: 17 U/L (ref 0–44)
AST: 18 U/L (ref 15–41)
Albumin: 3.9 g/dL (ref 3.5–5.0)
Alkaline Phosphatase: 101 U/L (ref 38–126)
Anion gap: 5 (ref 5–15)
BUN: 9 mg/dL (ref 6–20)
CO2: 25 mmol/L (ref 22–32)
Calcium: 8.9 mg/dL (ref 8.9–10.3)
Chloride: 110 mmol/L (ref 98–111)
Creatinine, Ser: 0.5 mg/dL (ref 0.44–1.00)
GFR, Estimated: >60 mL/min (ref >60)
Glucose, Bld: 92 mg/dL (ref 70–99)
Potassium: 3.4 mmol/L — ABNORMAL LOW (ref 3.5–5.1)
Sodium: 140 mmol/L (ref 135–145)
Total Bilirubin: 0.9 mg/dL (ref 0.3–1.2)
Total Protein: 7.3 g/dL (ref 6.5–8.1)

## 2022-01-18 LAB — URINALYSIS, ROUTINE W REFLEX MICROSCOPIC
Bilirubin Urine: NEGATIVE
Glucose, UA: NEGATIVE mg/dL
Hgb urine dipstick: NEGATIVE
Ketones, ur: NEGATIVE mg/dL
Nitrite: NEGATIVE
Protein, ur: NEGATIVE mg/dL
Specific Gravity, Urine: 1.02 (ref 1.005–1.030)
WBC, UA: >50 WBC/hpf — ABNORMAL HIGH (ref 0–5)
pH: 5 (ref 5.0–8.0)

## 2022-01-18 LAB — ETHANOL: Alcohol, Ethyl (B): <10 mg/dL (ref <10)

## 2022-01-18 LAB — RAPID URINE DRUG SCREEN, HOSP PERFORMED
Amphetamines: NOT DETECTED
Barbiturates: NOT DETECTED
Benzodiazepines: NOT DETECTED
Cocaine: POSITIVE — AB
Opiates: NOT DETECTED
Tetrahydrocannabinol: NOT DETECTED

## 2022-01-18 LAB — LACTIC ACID, PLASMA: Lactic Acid, Venous: 1.4 mmol/L (ref 0.5–1.9)

## 2022-01-18 MED ORDER — SODIUM CHLORIDE 0.9 % IV BOLUS: 1000.0000 mL | Freq: Once | INTRAVENOUS | Status: DC

## 2022-01-18 MED ORDER — SODIUM CHLORIDE 0.9 % IV BOLUS
1000.0000 mL | Freq: Once | INTRAVENOUS | Status: AC
  Administered 2022-01-18: 1000 mL via INTRAVENOUS

## 2022-01-18 MED ORDER — OLANZAPINE 10 MG PO TBDP
10.0000 mg | ORAL_TABLET | Freq: Every day | ORAL | Status: DC
  Administered 2022-01-18: 10 mg via ORAL
  Filled 2022-01-18: qty 1

## 2022-01-18 MED ORDER — FOSFOMYCIN TROMETHAMINE 3 G PO PACK
3.0000 g | PACK | Freq: Once | ORAL | Status: AC
  Administered 2022-01-18: 3 g via ORAL
  Filled 2022-01-18: qty 3

## 2022-01-18 MED ORDER — ONDANSETRON HCL 4 MG/2ML IJ SOLN
4.0000 mg | Freq: Once | INTRAMUSCULAR | Status: AC
  Administered 2022-01-18: 4 mg via INTRAVENOUS
  Filled 2022-01-18: qty 2

## 2022-01-18 MED ORDER — GABAPENTIN 100 MG PO CAPS
200.0000 mg | ORAL_CAPSULE | Freq: Three times a day (TID) | ORAL | Status: DC
  Administered 2022-01-18: 200 mg via ORAL
  Filled 2022-01-18: qty 2

## 2022-01-18 MED ORDER — KETOROLAC TROMETHAMINE 30 MG/ML IJ SOLN
30.0000 mg | Freq: Once | INTRAMUSCULAR | Status: AC
  Administered 2022-01-18: 30 mg via INTRAVENOUS
  Filled 2022-01-18: qty 1

## 2022-01-18 NOTE — Consult Note (Signed)
Telepsych Consultation   Reason for Consult:  psych consult Referring Physician:  Mancel Bale, MD Location of Patient:  Kristina Owens [id number] Location of Provider: Behavioral Health TTS Department  Patient Identification: Kristina Owens MRN:  [id number] Principal Diagnosis: Adjustment disorder with mixed anxiety and depressed mood Diagnosis:  Principal Problem:   Adjustment disorder with mixed anxiety and depressed mood Active Problems:   Cocaine use disorder, severe, dependence (HCC)   Generalized anxiety disorder   Polysubstance (excluding opioids) dependence (HCC)   Substance-induced disorder (HCC)   Total Time spent with patient: 30 minutes  Subjective:   Kristina Owens is a 56 y.o. female patient admitted with acute cystitis.  Patient presents alert and oriented. Slightly elevated affect; tangential. "I've had a lot of issues.Trying to get clean and sober. I've had a lot of pain abdominal, back, vaginal. Sometimes I feel good, sometimes I feel bad. Sometimes I'm up sometimes I'm down". Patient endorses active substance use including alcohol, cocaine, and amphetamines. Hyper-focused on physical ailments  and various medical issues that she attributes to lack of self-care, addiction, etc. Patient mentioned to this provider that she was given Toradol night previously and asked if would be able to receive more, provider explained she would forward concerns to EDP. She expressed concern for possible STDs including HIV due to frequent unprotected sex; HIV, RPR from 01/06/22 non-reactive. G/C tests ordered. Expressed frustrations with psychosocial stressors, financial hardship, homelessness.   She denies any active suicidal or homicidal ideation with intent or plan to harm herself or anyone; expresses intermittent feelings of hopelessness related to social factors. Endorses intermittent vague auditory hallucinations, unable to state specifics in detail. Provider discussed shelter resources,  outpatient services including substance abuse; while patient verbalized readiness for change, patient unable to verbalize commitment beyond facilities providing shelter. Of note patient has 8 ED encounters since 01/05/22, with multiple in same day; per chart review patient has presented and eloped from ED multiple times.   HPI:  Kristina Owens is a 56 year old female with a past psychiatric history of polysubstance abuse use since age 38, drug use since 29 began with Adderall then crack and meth. Last use 2-3 nights ago, crack cocaine; yesterday. UDS+cocaine, THC. BAL <10. She is currently homeless. Says she has been connected with [facility] in the past. Reports past history of Abilify, Zoloft, Lamictal but has been unable to remain sober for adequate diagnosis. UDS+Cocaine, BAL<10. PDMP reviewed, No active prescriptions in past 30 days.   Past Psychiatric History: substance induced disorder, polysubstance abuse, generalized anxiety disorder, cocaine use disorder severe dependence  Risk to Self:   Risk to Others:   Prior Inpatient Therapy:   Prior Outpatient Therapy:    Past Medical History:  Past Medical History:  Diagnosis Date   Anxiety    Depression    DVT (deep vein thrombosis) in pregnancy    GERD (gastroesophageal reflux disease)    Ovarian cyst     Past Surgical History:  Procedure Laterality Date   APPENDECTOMY  age 73   IR PTA VENOUS EXCEPT DIALYSIS CIRCUIT  01/16/2021   IR RADIOLOGIST EVAL & MGMT  03/19/2021   IR THROMBECT VENO MECH MOD SED  01/16/2021   IR US GUIDE VASC ACCESS LEFT  01/16/2021   IR VENO/EXT/UNI LEFT  01/16/2021   IR VENOCAVAGRAM IVC  01/16/2021   TONSILLECTOMY Bilateral age 27   Family History:  Family History  Problem Relation Age of Onset   Asthma Mother    COPD Mother  Cancer Father        thyroid cancer   Family Psychiatric  History: not noted Social History:  Social History   Substance and Sexual Activity  Alcohol Use Yes     Social History    Substance and Sexual Activity  Drug Use Yes   Types: "Crack" cocaine, Cocaine, Methamphetamines   Comment: last used 4/31/22    Social History   Socioeconomic History   Marital status: Divorced    Spouse name: Not on file   Number of children: Not on file   Years of education: Not on file   Highest education level: Not on file  Occupational History   Not on file  Tobacco Use   Smoking status: Every Day    Packs/day: 0.50    Years: 35.00    Total pack years: 17.50    Types: Cigarettes   Smokeless tobacco: Never  Vaping Use   Vaping Use: Never used  Substance and Sexual Activity   Alcohol use: Yes   Drug use: Yes    Types: "Crack" cocaine, Cocaine, Methamphetamines    Comment: last used 4/31/22   Sexual activity: Not on file  Other Topics Concern   Not on file  Social History Narrative   Not on file   Social Determinants of Health   Financial Resource Strain: Not on file  Food Insecurity: Not on file  Transportation Needs: Not on file  Physical Activity: Not on file  Stress: Not on file  Social Connections: Not on file   Additional Social History:    Allergies:  No Known Allergies  Labs:  Results for orders placed or performed during the hospital encounter of 01/17/22 (from the past 48 hour(s))  Lactic acid, plasma     Status: None   Collection Time: 01/18/22  3:45 AM  Result Value Ref Range   Lactic Acid, Venous 1.4 0.5 - 1.9 mmol/L    Comment: Performed at [facility], [address] [place], [place] [address]  Lipase, blood     Status: None   Collection Time: 01/18/22  4:11 AM  Result Value Ref Range   Lipase 36 11 - 51 U/L    Comment: Performed at [facility], [address] [place], [place] [address]  Comprehensive metabolic panel     Status: Abnormal   Collection Time: 01/18/22  4:11 AM  Result Value Ref Range   Sodium 140 135 - 145 mmol/L   Potassium 3.4 (L) 3.5 - 5.1 mmol/L   Chloride 110 98 - 111  mmol/L   CO2 25 22 - 32 mmol/L   Glucose, Bld 92 70 - 99 mg/dL    Comment: Glucose reference range applies only to samples taken after fasting for at least 8 hours.   BUN 9 6 - 20 mg/dL   Creatinine, Ser 0.980.50 0.44 - 1.00 mg/dL   Calcium 8.9 8.9 - 11.910.3 mg/dL   Total Protein 7.3 6.5 - 8.1 g/dL   Albumin 3.9 3.5 - 5.0 g/dL   AST 18 15 - 41 U/L   ALT 17 0 - 44 U/L   Alkaline Phosphatase 101 38 - 126 U/L   Total Bilirubin 0.9 0.3 - 1.2 mg/dL   GFR, Estimated >14>60 >78>60 mL/min    Comment: (NOTE) Calculated using the CKD-EPI Creatinine Equation (2021)    Anion gap 5 5 - 15    Comment: Performed at [facility], [address] [place], [place] [address]  CBC  Status: None   Collection Time: 01/18/22  4:11 AM  Result Value Ref Range   WBC 7.6 4.0 - 10.5 K/uL   RBC 4.67 3.87 - 5.11 MIL/uL   Hemoglobin 14.3 12.0 - 15.0 g/dL   HCT 16.1 09.6 - 04.5 %   MCV 92.7 80.0 - 100.0 fL   MCH 30.6 26.0 - 34.0 pg   MCHC 33.0 30.0 - 36.0 g/dL   RDW 40.9 81.1 - 91.4 %   Platelets 282 150 - 400 K/uL   nRBC 0.0 0.0 - 0.2 %    Comment: Performed at [facility], [address] [place], [place] [address]  Urinalysis, Routine w reflex microscopic Urine, Clean Catch     Status: Abnormal   Collection Time: 01/18/22  5:36 AM  Result Value Ref Range   Color, Urine YELLOW YELLOW   APPearance HAZY (A) CLEAR   Specific Gravity, Urine 1.020 1.005 - 1.030   pH 5.0 5.0 - 8.0   Glucose, UA NEGATIVE NEGATIVE mg/dL   Hgb urine dipstick NEGATIVE NEGATIVE   Bilirubin Urine NEGATIVE NEGATIVE   Ketones, ur NEGATIVE NEGATIVE mg/dL   Protein, ur NEGATIVE NEGATIVE mg/dL   Nitrite NEGATIVE NEGATIVE   Leukocytes,Ua LARGE (A) NEGATIVE   RBC / HPF 6-10 0 - 5 RBC/hpf   WBC, UA >50 (H) 0 - 5 WBC/hpf   Bacteria, UA RARE (A) NONE SEEN   Squamous Epithelial / LPF 11-20 0 - 5   Mucus PRESENT    Non Squamous Epithelial 0-5 (A) NONE SEEN    Comment: Performed at [facility], [address] [place], [place] [address]  Rapid urine drug screen (hospital performed)     Status: Abnormal   Collection Time: 01/18/22  8:08 AM  Result Value Ref Range   Opiates NONE DETECTED NONE DETECTED   Cocaine POSITIVE (A) NONE DETECTED   Benzodiazepines NONE DETECTED NONE DETECTED   Amphetamines NONE DETECTED NONE DETECTED   Tetrahydrocannabinol NONE DETECTED NONE DETECTED   Barbiturates NONE DETECTED NONE DETECTED    Comment: (NOTE) DRUG SCREEN FOR MEDICAL PURPOSES ONLY.  IF CONFIRMATION IS NEEDED FOR ANY PURPOSE, NOTIFY LAB WITHIN 5 DAYS.  LOWEST DETECTABLE LIMITS FOR URINE DRUG SCREEN Drug Class                     Cutoff (ng/mL) Amphetamine and metabolites    1000 Barbiturate and metabolites    200 Benzodiazepine                 200 Tricyclics and metabolites     300 Opiates and metabolites        300 Cocaine and metabolites        300 THC                            50 Performed at [facility], [address], [place], [place] [address]   Rapid HIV screen (HIV 1/2 Ab+Ag) ([facility] Only)     Status: None   Collection Time: 01/18/22 11:13 AM  Result Value Ref Range   HIV-1 P24 Antigen - HIV24 NON REACTIVE NON REACTIVE    Comment: RESULT CALLED TO, READ BACK BY AND VERIFIED WITH: GRIFFIN,M RN ON 01/18/22 @ 1359 BY GOLSONM RESULT CALLED TO, READ BACK BY AND VERIFIED WITH: GRIFFIN,M RN ON 01/18/22 @ 1359 BY GOLSONM (NOTE) Detection of p24 may be inhibited by biotin in the sample,  causing false negative results in acute infection.    HIV 1/2 Antibodies NON REACTIVE NON REACTIVE   Interpretation (HIV Ag Ab)      A non reactive test result means that HIV 1 or HIV 2 antibodies and HIV 1 p24 antigen were not detected in the specimen.    Comment: Performed at [facility], [address], [place], [place] [address]  Ethanol     Status: None   Collection Time: 01/18/22 11:17 AM  Result Value Ref Range   Alcohol, Ethyl (B)  <10 <10 mg/dL    Comment: (NOTE) Lowest detectable limit for serum alcohol is 10 mg/dL.  For medical purposes only. Performed at [facility], [address] [place], [place] [address]     Medications:  No current facility-administered medications for this encounter.   Current Outpatient Medications  Medication Sig Dispense Refill   ibuprofen (ADVIL) 200 MG tablet Take 1,000-1,200 mg by mouth 2 (two) times daily as needed (pain).     doxycycline (VIBRAMYCIN) 100 MG capsule Take 1 capsule (100 mg total) by mouth 2 (two) times daily. (Patient not taking: Reported on 01/18/2022) 28 capsule 0   metroNIDAZOLE (FLAGYL) 500 MG tablet Take 1 tablet (500 mg total) by mouth 2 (two) times daily. (Patient not taking: Reported on 01/18/2022) 28 tablet 0   RIVAROXABAN (XARELTO) VTE STARTER PACK (15 & 20 MG) Follow package directions: Take one 15mg  tablet by mouth twice a day. On day 22, switch to one 20mg  tablet once a day. Take with food. (Patient not taking: Reported on 01/18/2022) 51 each 0    Musculoskeletal: Strength & Muscle Tone: within normal limits Gait & Station: normal Patient leans: N/A  Psychiatric Specialty Exam:  Presentation  General Appearance: Disheveled  Eye Contact:Minimal  Speech:Clear and Coherent  Speech Volume:Normal  Handedness:Right   Mood and Affect  Mood:Depressed  Affect:Congruent   Thought Process  Thought Processes:Coherent  Descriptions of Associations:Intact  Orientation:Full (Time, Place and Person)  Thought Content:Logical  History of Schizophrenia/Schizoaffective disorder:No  Duration of Psychotic Symptoms:No data recorded Hallucinations:No data recorded Ideas of Reference:None  Suicidal Thoughts:No data recorded Homicidal Thoughts:No data recorded  Sensorium  Memory:Immediate Fair; Recent Fair; Remote Fair  Judgment:Intact  Insight:Present   Executive Functions  Concentration:Fair  Attention  Span:Fair  Recall:Fair  Fund of Knowledge:Fair  Language:Fair   Psychomotor Activity  Psychomotor Activity:No data recorded  Assets  Assets:Communication Skills; Desire for Improvement; Financial Resources/Insurance   Sleep  Sleep:No data recorded   Physical Exam: Physical Exam Vitals and nursing note reviewed.  Constitutional:      Appearance: She is normal weight. She is ill-appearing.  HENT:     Head: Normocephalic.     Mouth/Throat:     Mouth: Mucous membranes are moist.  Eyes:     Extraocular Movements: Extraocular movements intact.  Cardiovascular:     Rate and Rhythm: Normal rate.  Pulmonary:     Effort: Pulmonary effort is normal.  Abdominal:     General: Abdomen is flat.  Skin:    General: Skin is warm and dry.  Neurological:     Mental Status: She is alert and oriented to person, place, and time.  Psychiatric:        Mood and Affect: Mood is anxious.        Speech: Speech is tangential.        Behavior: Behavior is cooperative.        Thought Content: Thought content is not paranoid  or delusional. Thought content does not include homicidal ideation. Thought content does not include homicidal or suicidal plan.        Cognition and Memory: Cognition and memory normal.        Judgment: Judgment is impulsive.    Review of Systems  Psychiatric/Behavioral:  Positive for depression and substance abuse. The patient is nervous/anxious.   All other systems reviewed and are negative.  Blood pressure 104/64, pulse 69, temperature 98 F (36.7 C), temperature source Oral, resp. rate 18, SpO2 96 %. There is no height or weight on file to calculate BMI.  Treatment Plan Summary: Plan psych clear patient with resources for individual follow-up. TOC consult placed. EDP consulted based on medical complaints.   Disposition: No evidence of imminent risk to self or others at present.   Patient does not meet criteria for psychiatric inpatient admission. Supportive  therapy provided about ongoing stressors. Discussed crisis plan, support from social network, calling 911, coming to the Emergency Department, and calling Suicide Hotline.  This service was provided via telemedicine using a 2-way, interactive audio and video technology.  Names of all persons participating in this telemedicine service and their role in this encounter. Name: Maxie Barb Role: PMHNP  Name: Nelly Rout Role: Attending MD  Name: Kristina Owens Role: patient  Name:  Role:     Loletta Parish, NP 01/18/2022 7:28 PM

## 2022-01-18 NOTE — ED Notes (Signed)
Lab called to add on urine drug screen.  °

## 2022-01-18 NOTE — ED Notes (Signed)
Upon entering room to discharge patient, pt verbalized needing help with her mental health. When asked to further explain, pt stated she was actively suicidal with a plan to take drugs and hope it kills her. MD notified and TTS consult placed.

## 2022-01-18 NOTE — ED Notes (Signed)
Went into room to draw labs, pt was asleep.  Explained to patient that I was going to get blood work, she pulled her arms back under covers. States "just give me a minute".

## 2022-01-18 NOTE — TOC CAGE-AID Note (Signed)
Transition of Care [facility]) - CAGE-AID Screening   Patient Details  Name: Miyah Hampshire MRN: [id number] Date of Birth: 07-Sep-1965  Transition of [facility]) CM/SW Contact:    Lossie Faes Tarpley-Carter, LCSWA Phone Number: 01/18/2022, 2:08 PM   Clinical Narrative: Pt participated in Cage-Aid.  Pt stated she does use substance and ETOH.  Pt was offered resources, due to usage of substance or ETOH.     Everitt Wenner Tarpley-Carter, MSW, LCSW-A Pronouns:  She/Her/Hers [facility] Clinical Social Worker Direct Number:  [phone] [email or web address]  CAGE-AID Screening:    Have You Ever Felt You Ought to Cut Down on Your Drinking or Drug Use?: Yes Have People Annoyed You By [employer] Your Drinking Or Drug Use?: No Have You Felt Bad Or Guilty About Your Drinking Or Drug Use?: No Have You Ever Had a Drink or Used Drugs First Thing In The Morning to Steady Your Nerves or to Get Rid of a Hangover?: No CAGE-AID Score: 1  Substance Abuse Education Offered: Yes  Substance abuse interventions: Transport planner

## 2022-01-18 NOTE — ED Provider Notes (Signed)
7:55 AM-nurse was discharging patient after treatment for UTI when she reported to the nurse that she was suicidal with a plan to overdose on drugs.  I discussed this with the patient and she again stated that she is feeling suicidal, with hopelessness and inability to avoid use of alcohol and illegal drugs.  She reports being homeless, recently has been staying "here and there."  At this time the patient is alert and conversant, eating a sandwich.  She appears hungry.  No respiratory distress.  She is not showing any specific signs of withdrawal.  She does appear mildly anxious.  The patient had been seen earlier today, and treated with fosfomycin for suspected UTI.  At 4:32 AM her oxygenation was low, 84% on room air.  She has been placed on nasal cannula oxygen with improvement to 100% using 2 L nasal cannula.  12:55 PM-she has been seen by psychiatry and cleared.  They thought having TOC evaluate the patient will be helpful.  Patient agrees and asked for "resources."  Pharmacy notes that the patient was recently diagnosed with DVT and did not fill her oral anticoagulant as directed.  She has had a prior DVT, and the imaging from 01/06/2022 is similar to 06/13/2021.  This indicates chronic left leg DVT.  At this time she does not have pain/tenderness or swelling of the left leg.  She does have blisters of both feet, left greater than right.  These blisters are on the soles and do not appear to be infected.  It is noted that she has poor hygiene, and appears to not have bathed recently.  Again note that she is homeless.  1:15 PM-I discussed the case with vascular surgery, Dr. Lenell Antu.  Patient had two ultrasounds, greater than 6 months apart, showing unchanged chronic DVT.  He states that this can be considered stable, and essentially ' a scar."  Therefore she does not need to be on oral anticoagulants.  She has been fully treated for UTI.  Awaiting TOC consultation for resource guidance.   1:30 PM-patient  has now decided that she is ready to leave and does not want to wait for [facility] consultation.   Mancel Bale, MD 01/18/22 1339

## 2022-01-18 NOTE — ED Provider Notes (Signed)
[facility]-EMERGENCY DEPT Provider Note   CSN: [id number] Arrival date & time: 01/17/22  2110     History  Chief Complaint  Patient presents with   Abdominal Pain    Kristina Owens is a 56 y.o. female who presents emergency department chief complaint of pelvic pain.  Patient states that she has been having pelvic pain for about 2 weeks.  She complains of a 45 pound weight loss over the past 9 months.  She denies urinary or vaginal symptoms.  Pain is periumbilical and suprapubic.  She denies flank pain.  Nothing seems to make it worse or better.  She admits to a problem with alcohol and drug abuse but denies withdrawal symptoms coming off of alcohol.  She does use cocaine, crack and methamphetamine and occasionally uses IV drugs.  Patient was seen for the same complaint on the second of this month and had a negative CT of the abdomen.  Abdominal Pain      Home Medications Prior to Admission medications   Medication Sig Start Date End Date Taking? Authorizing Provider  doxycycline (VIBRAMYCIN) 100 MG capsule Take 1 capsule (100 mg total) by mouth 2 (two) times daily. 01/06/22   Dione Booze, MD  metroNIDAZOLE (FLAGYL) 500 MG tablet Take 1 tablet (500 mg total) by mouth 2 (two) times daily. 01/06/22   Dione Booze, MD  RIVAROXABAN Carlena Hurl) VTE STARTER PACK (15 & 20 MG) Follow package directions: Take one 15mg  tablet by mouth twice a day. On day 22, switch to one 20mg  tablet once a day. Take with food. 01/07/22   , MD      Allergies    Patient has no known allergies.    Review of Systems   Review of Systems  Gastrointestinal:  Positive for abdominal pain.    Physical Exam Updated Vital Signs BP 125/77   Pulse (!) 58   Temp 98.4 F (36.9 C) (Oral)   Resp 14   SpO2 92%  Physical Exam Vitals and nursing note reviewed.  Constitutional:      General: She is not in acute distress.    Appearance: She is well-developed. She is not diaphoretic.   HENT:     Head: Normocephalic and atraumatic.     Right Ear: External ear normal.     Left Ear: External ear normal.     Nose: Nose normal.     Mouth/Throat:     Mouth: Mucous membranes are moist.  Eyes:     General: No scleral icterus.    Conjunctiva/sclera: Conjunctivae normal.  Cardiovascular:     Rate and Rhythm: Normal rate and regular rhythm.     Heart sounds: Normal heart sounds. No murmur heard.    No friction rub. No gallop.  Pulmonary:     Effort: Pulmonary effort is normal. No respiratory distress.     Breath sounds: Normal breath sounds.  Abdominal:     General: Bowel sounds are normal. There is no distension.     Palpations: Abdomen is soft. There is no mass.     Tenderness: There is abdominal tenderness in the periumbilical area. There is no guarding.  Musculoskeletal:     Cervical back: Normal range of motion.  Skin:    General: Skin is warm and dry.  Neurological:     Mental Status: She is alert and oriented to person, place, and time.  Psychiatric:        Behavior: Behavior normal.     ED Results /  Procedures / Treatments   Labs (all labs ordered are listed, but only abnormal results are displayed) Labs Reviewed  COMPREHENSIVE METABOLIC PANEL - Abnormal; Notable for the following components:      Result Value   Potassium 3.4 (*)    All other components within normal limits  CULTURE, BLOOD (ROUTINE X 2)  CULTURE, BLOOD (ROUTINE X 2)  LIPASE, BLOOD  CBC  LACTIC ACID, PLASMA  URINALYSIS, ROUTINE W REFLEX MICROSCOPIC  LACTIC ACID, PLASMA    EKG None  Radiology No results found.  Procedures Procedures    Medications Ordered in ED Medications  sodium chloride 0.9 % bolus 1,000 mL (1,000 mLs Intravenous New Bag/Given 01/18/22 0409)  ketorolac (TORADOL) 30 MG/ML injection 30 mg (30 mg Intravenous Given 01/18/22 0419)  ondansetron (ZOFRAN) injection 4 mg (4 mg Intravenous Given 01/18/22 0418)    ED Course/ Medical Decision Making/ A&P                           Medical Decision Making 56 year old female with history of homelessness presents with suprapubic pain.  She has been seen several times in the past with a recent CT abdomen and pelvis that was negative.  After review of all data points patient appears to have a urinary tract infection.  She denies vaginal symptoms to me.  Given her homelessness I have ordered fosfomycin for treatment.  Plan to discharge the patient.  Amount and/or Complexity of Data Reviewed Labs: ordered.  Risk Prescription drug management.           Final Clinical Impression(s) / ED Diagnoses Final diagnoses:  None    Rx / DC Orders ED Discharge Orders     None         Arthor Captain, PA-C 01/18/22 1846    Gilda Crease, MD 01/21/22 [phone]

## 2022-01-18 NOTE — ED Notes (Addendum)
Pt's oxygen on room air 84% Pt placed on oxygen 2 liters via nasal canula, Oxygen sats on 2 liters 96%

## 2022-01-18 NOTE — Discharge Instructions (Addendum)
Get plenty of rest and drink a lot of fluids.See your doctor as needed for problems.  Use the attached resource guides to help you.

## 2022-01-19 LAB — URINE CULTURE: Culture: <10000 — AB

## 2022-01-23 LAB — CULTURE, BLOOD (ROUTINE X 2)
Culture: NO GROWTH
Culture: NO GROWTH
Special Requests: ADEQUATE
Special Requests: ADEQUATE

## 2022-01-24 ENCOUNTER — Other Ambulatory Visit (HOSPITAL_COMMUNITY): Payer: Self-pay

## 2022-01-24 ENCOUNTER — Telehealth (HOSPITAL_COMMUNITY): Payer: Self-pay

## 2022-03-06 ENCOUNTER — Emergency Department (HOSPITAL_COMMUNITY): Payer: Medicaid Other

## 2022-03-06 ENCOUNTER — Emergency Department (HOSPITAL_COMMUNITY)
Admission: EM | Admit: 2022-03-06 | Discharge: 2022-03-07 | Disposition: A | Discharge status: Home / Self Care | Payer: Self-pay | Attending: Emergency Medicine | Admitting: Emergency Medicine

## 2022-03-06 ENCOUNTER — Encounter (HOSPITAL_COMMUNITY): Payer: Self-pay

## 2022-03-06 ENCOUNTER — Other Ambulatory Visit: Payer: Self-pay

## 2022-03-06 DIAGNOSIS — Z7901 Long term (current) use of anticoagulants: Secondary | ICD-10-CM | POA: Insufficient documentation

## 2022-03-06 DIAGNOSIS — F17210 Nicotine dependence, cigarettes, uncomplicated: Secondary | ICD-10-CM | POA: Insufficient documentation

## 2022-03-06 DIAGNOSIS — G8929 Other chronic pain: Secondary | ICD-10-CM | POA: Insufficient documentation

## 2022-03-06 DIAGNOSIS — Z5900 Homelessness unspecified: Secondary | ICD-10-CM | POA: Insufficient documentation

## 2022-03-06 LAB — URINALYSIS, ROUTINE W REFLEX MICROSCOPIC
Bacteria, UA: NONE SEEN
Bilirubin Urine: NEGATIVE
Glucose, UA: NEGATIVE mg/dL
Hgb urine dipstick: NEGATIVE
Ketones, ur: NEGATIVE mg/dL
Nitrite: NEGATIVE
Protein, ur: NEGATIVE mg/dL
Specific Gravity, Urine: 1.015 (ref 1.005–1.030)
pH: 5 (ref 5.0–8.0)

## 2022-03-06 LAB — CBC WITH DIFFERENTIAL/PLATELET
Abs Immature Granulocytes: 0.04 10*3/uL (ref 0.00–0.07)
Basophils Absolute: 0 10*3/uL (ref 0.0–0.1)
Basophils Relative: 0 %
Eosinophils Absolute: 0.2 10*3/uL (ref 0.0–0.5)
Eosinophils Relative: 2 %
HCT: 44.5 % (ref 36.0–46.0)
Hemoglobin: 14.4 g/dL (ref 12.0–15.0)
Immature Granulocytes: 1 %
Lymphocytes Relative: 43 %
Lymphs Abs: 3.6 10*3/uL (ref 0.7–4.0)
MCH: 29.9 pg (ref 26.0–34.0)
MCHC: 32.4 g/dL (ref 30.0–36.0)
MCV: 92.3 fL (ref 80.0–100.0)
Monocytes Absolute: 0.6 10*3/uL (ref 0.1–1.0)
Monocytes Relative: 7 %
Neutro Abs: 4.1 10*3/uL (ref 1.7–7.7)
Neutrophils Relative %: 47 %
Platelets: 314 10*3/uL (ref 150–400)
RBC: 4.82 MIL/uL (ref 3.87–5.11)
RDW: 12.6 % (ref 11.5–15.5)
WBC: 8.5 10*3/uL (ref 4.0–10.5)
nRBC: 0 % (ref 0.0–0.2)

## 2022-03-06 LAB — RAPID URINE DRUG SCREEN, HOSP PERFORMED
Amphetamines: NOT DETECTED
Barbiturates: NOT DETECTED
Benzodiazepines: NOT DETECTED
Cocaine: NOT DETECTED
Opiates: NOT DETECTED
Tetrahydrocannabinol: NOT DETECTED

## 2022-03-06 LAB — COMPREHENSIVE METABOLIC PANEL
ALT: 22 U/L (ref 0–44)
AST: 18 U/L (ref 15–41)
Albumin: 4 g/dL (ref 3.5–5.0)
Alkaline Phosphatase: 81 U/L (ref 38–126)
Anion gap: 8 (ref 5–15)
BUN: 7 mg/dL (ref 6–20)
CO2: 26 mmol/L (ref 22–32)
Calcium: 9.1 mg/dL (ref 8.9–10.3)
Chloride: 106 mmol/L (ref 98–111)
Creatinine, Ser: 0.49 mg/dL (ref 0.44–1.00)
GFR, Estimated: >60 mL/min (ref >60)
Glucose, Bld: 121 mg/dL — ABNORMAL HIGH (ref 70–99)
Potassium: 3.7 mmol/L (ref 3.5–5.1)
Sodium: 140 mmol/L (ref 135–145)
Total Bilirubin: 0.5 mg/dL (ref 0.3–1.2)
Total Protein: 7.2 g/dL (ref 6.5–8.1)

## 2022-03-06 LAB — TROPONIN I (HIGH SENSITIVITY): Troponin I (High Sensitivity): <2 ng/L (ref <18)

## 2022-03-06 LAB — I-STAT BETA HCG BLOOD, ED (MC, WL, AP ONLY): I-stat hCG, quantitative: <5 m[IU]/mL (ref <5)

## 2022-03-06 LAB — LIPASE, BLOOD: Lipase: 30 U/L (ref 11–51)

## 2022-03-06 NOTE — ED Triage Notes (Signed)
Recurrent left flank pain and left calf pain. Says sx have been going on for many months.

## 2022-03-06 NOTE — ED Notes (Signed)
Needs IV for CT  

## 2022-03-06 NOTE — ED Provider Triage Note (Signed)
Emergency Medicine Provider Triage Evaluation Note  [facility] , a 56 y.o. female  was evaluated in triage.  Pt complains of left calf pain intermittently for "a while" but it got severe yesterday.   She used to be on warfarin. But relapsed on alcohol and drugs.   She has not been on warfarin for a long time per her report.    She reports feeling short of breath for two days.   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   She reports that she hasn't had any alcohol in a week, doesn't feel like withdrawing.   She reports that she hasn't had any IV drug use since the spring, that it was a one time thing.   Hasn't used crack in a week.   She reports LLQ abdominal pain, concerned for UTI.   Physical Exam  BP (!) 145/94 (BP Location: Right Arm)   Pulse 68   Temp 97.9 F (36.6 C) (Oral)   Resp 18   Ht 5\' 3"  (1.6 m)   Wt 54.4 kg   SpO2 100%   BMI 21.26 kg/m  Gen:   Awake, no distress   Resp:  Normal effort  MSK:   Moves extremities without difficulty  Other:  Normal speech. Left leg is swollen compared to right.  2+ dp pulses left leg.  Abdomen is TTP in LLQ.   Medical Decision Making  Medically screening exam initiated at 7:36 PM.  Appropriate orders placed.  Nichelle Victorino Dike was informed that the remainder of the evaluation will be completed by another provider, this initial triage assessment does not replace that evaluation, and the importance of remaining in the ED until their evaluation is complete.  Patient with chronic untreated DVT in her left leg with new shortness of breath.  She also has abdominal pain.  Will obtain PE study and abdominal CT with contrast.   Last abdominal CT was in 01/07/22, doubt Dissection.    03/09/22, Cristina Gong 03/06/22 1948

## 2022-03-07 ENCOUNTER — Other Ambulatory Visit: Payer: Self-pay

## 2022-03-07 ENCOUNTER — Other Ambulatory Visit (HOSPITAL_COMMUNITY): Payer: Self-pay

## 2022-03-07 ENCOUNTER — Emergency Department (HOSPITAL_COMMUNITY)
Admission: EM | Admit: 2022-03-07 | Discharge: 2022-03-07 | Discharge status: Against Medical Advice | Payer: Medicaid Other

## 2022-03-07 ENCOUNTER — Ambulatory Visit (HOSPITAL_COMMUNITY)
Admission: AD | Admit: 2022-03-07 | Discharge: 2022-03-07 | Disposition: A | Discharge status: Home / Self Care | Payer: No Payment, Other | Attending: Psychiatry | Admitting: Psychiatry

## 2022-03-07 ENCOUNTER — Encounter (HOSPITAL_COMMUNITY): Payer: Self-pay

## 2022-03-07 ENCOUNTER — Emergency Department (HOSPITAL_COMMUNITY)
Admission: EM | Admit: 2022-03-07 | Discharge: 2022-03-10 | Disposition: A | Discharge status: Home / Self Care | Payer: Medicaid Other | Attending: Emergency Medicine | Admitting: Emergency Medicine

## 2022-03-07 DIAGNOSIS — F1020 Alcohol dependence, uncomplicated: Secondary | ICD-10-CM | POA: Diagnosis present

## 2022-03-07 DIAGNOSIS — Z5900 Homelessness unspecified: Secondary | ICD-10-CM | POA: Insufficient documentation

## 2022-03-07 DIAGNOSIS — Z20822 Contact with and (suspected) exposure to covid-19: Secondary | ICD-10-CM | POA: Insufficient documentation

## 2022-03-07 DIAGNOSIS — F333 Major depressive disorder, recurrent, severe with psychotic symptoms: Secondary | ICD-10-CM | POA: Diagnosis present

## 2022-03-07 DIAGNOSIS — F1420 Cocaine dependence, uncomplicated: Secondary | ICD-10-CM | POA: Diagnosis present

## 2022-03-07 DIAGNOSIS — R45851 Suicidal ideations: Secondary | ICD-10-CM

## 2022-03-07 DIAGNOSIS — R799 Abnormal finding of blood chemistry, unspecified: Secondary | ICD-10-CM | POA: Insufficient documentation

## 2022-03-07 DIAGNOSIS — M79605 Pain in left leg: Secondary | ICD-10-CM | POA: Insufficient documentation

## 2022-03-07 DIAGNOSIS — F1010 Alcohol abuse, uncomplicated: Secondary | ICD-10-CM | POA: Insufficient documentation

## 2022-03-07 MED ORDER — ONDANSETRON 4 MG PO TBDP
4.0000 mg | ORAL_TABLET | Freq: Once | ORAL | Status: AC
  Administered 2022-03-07: 4 mg via ORAL
  Filled 2022-03-07: qty 1

## 2022-03-07 MED ORDER — RIVAROXABAN (XARELTO) VTE STARTER PACK (15 & 20 MG)
ORAL_TABLET | ORAL | 0 refills | Status: DC
  Filled 2022-03-07: qty 51, fill #0

## 2022-03-07 MED ORDER — RIVAROXABAN (XARELTO) VTE STARTER PACK (15 & 20 MG): ORAL_TABLET | ORAL | 0 refills | Status: DC

## 2022-03-07 MED ORDER — IBUPROFEN 800 MG PO TABS
800.0000 mg | ORAL_TABLET | Freq: Once | ORAL | Status: AC
  Administered 2022-03-07: 800 mg via ORAL
  Filled 2022-03-07: qty 1

## 2022-03-07 MED ORDER — METHOCARBAMOL 500 MG PO TABS: 500.0000 mg | ORAL_TABLET | Freq: Three times a day (TID) | ORAL | 0 refills | Status: DC | PRN

## 2022-03-07 MED ORDER — ONDANSETRON 4 MG PO TBDP: 4.0000 mg | ORAL_TABLET | Freq: Three times a day (TID) | ORAL | 0 refills | Status: DC | PRN
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
## 2022-03-07 NOTE — ED Triage Notes (Signed)
BIBA from [employer] for SI thoughts with VH/AH, no plan. ETOH since lunch. Last drink 2hrs ago.

## 2022-03-07 NOTE — ED Notes (Signed)
Patient called from lobby with no answer   

## 2022-03-07 NOTE — Discharge Instructions (Addendum)
You were evaluated in the Emergency Department and after careful evaluation, we did not find any emergent condition requiring admission or further testing in the hospital.  Your exam/testing today is overall reassuring.  Recommend restarting the blood thinner and following up with her primary care doctor.  Please return to the Emergency Department if you experience any worsening of your condition.   Thank you for allowing [place] to be a part of your care.

## 2022-03-07 NOTE — ED Provider Notes (Signed)
[facility]-EMERGENCY DEPT [facility] Emergency Department Provider Note MRN:  [id number]  Arrival date & time: 03/08/22     Chief Complaint   Psychiatric Evaluation   History of Present Illness   Kristina Owens is a 56 y.o. year-old female presents to the ED with chief complaint of suicidal thoughts.  She states she is homeless.  She has been using drugs and alcohol.  She states that she has no desire to live.  She has chronic pain in her left leg.  Was evaluated last night for this.  Denies any recent illnesses..  History provided by patient.   Review of Systems  Pertinent review of systems noted in HPI.    Physical Exam   Vitals:   03/07/22 2321  BP: 94/64  Pulse: 72  Resp: 18  Temp: 98 F (36.7 C)  SpO2: 100%    CONSTITUTIONAL:  nontoxic-appearing, NAD NEURO:  Alert and oriented x 3, CN 3-12 grossly intact EYES:  eyes equal and reactive ENT/NECK:  Supple, no stridor  CARDIO:  normal rate, regular rhythm, appears well-perfused  PULM:  No respiratory distress,  GI/GU:  non-distended,  MSK/SPINE:  No gross deformities, no edema, moves all extremities  SKIN:  no rash, atraumatic   *Additional and/or pertinent findings included in MDM below  Diagnostic and Interventional Summary    EKG Interpretation  Date/Time:  Saturday March 08 2022 01:50:40 EDT Ventricular Rate:  69 PR Interval:  140 QRS Duration: 87 QT Interval:  403 QTC Calculation: 432 R Axis:   63 Text Interpretation: Sinus rhythm Confirmed by Zadie Rhine ([id number]) on 03/08/2022 2:15:29 AM       Labs Reviewed  COMPREHENSIVE METABOLIC PANEL - Abnormal; Notable for the following components:      Result Value   Calcium 8.3 (*)    Total Protein 6.4 (*)    All other components within normal limits  SALICYLATE LEVEL - Abnormal; Notable for the following components:   Salicylate Lvl <7.0 (*)    All other components within normal limits  ACETAMINOPHEN LEVEL - Abnormal; Notable for the following  components:   Acetaminophen (Tylenol), Serum <10 (*)    All other components within normal limits  ETHANOL - Abnormal; Notable for the following components:   Alcohol, Ethyl (B) 76 (*)    All other components within normal limits  CBC WITH DIFFERENTIAL/PLATELET - Abnormal; Notable for the following components:   Lymphs Abs 4.2 (*)    All other components within normal limits  RESP PANEL BY RT-PCR (FLU A&B, COVID) ARPGX2  RAPID URINE DRUG SCREEN, HOSP PERFORMED  CBG MONITORING, ED  I-STAT BETA HCG BLOOD, ED ([facility], [facility], [facility] ONLY)    No orders to display    Medications - No data to display   Procedures  /  Critical Care Procedures  ED Course and Medical Decision Making  I have reviewed the triage vital signs, the nursing notes, and pertinent available records from the EMR.  Social Determinants Affecting Complexity of Care: Patient has no clinically significant social determinants affecting this chief complaint..   ED Course:    Medical Decision Making Patient here with suicidal thoughts and polysubstance abuse.  Patient appears medically clear for TTS evaluation.  Amount and/or Complexity of Data Reviewed Labs: ordered.     Consultants: TTS consult   Treatment and Plan: Dispo pending TTS.    Final Clinical Impressions(s) / ED Diagnoses     ICD-10-CM   1. Suicidal ideation  R45.851  ED Discharge Orders     None         Discharge Instructions Discussed with and Provided to Patient:   Discharge Instructions   None      Roxy Horseman, PA-C 03/08/22 0424    Zadie Rhine, MD 03/08/22 [phone]

## 2022-03-08 DIAGNOSIS — F1010 Alcohol abuse, uncomplicated: Secondary | ICD-10-CM | POA: Diagnosis present

## 2022-03-08 DIAGNOSIS — F10188 Alcohol abuse with other alcohol-induced disorder: Secondary | ICD-10-CM

## 2022-03-08 DIAGNOSIS — F1499 Cocaine use, unspecified with unspecified cocaine-induced disorder: Secondary | ICD-10-CM

## 2022-03-08 DIAGNOSIS — F333 Major depressive disorder, recurrent, severe with psychotic symptoms: Secondary | ICD-10-CM

## 2022-03-08 LAB — CBC WITH DIFFERENTIAL/PLATELET
Abs Immature Granulocytes: 0.04 10*3/uL (ref 0.00–0.07)
Basophils Absolute: 0.1 10*3/uL (ref 0.0–0.1)
Basophils Relative: 1 %
Eosinophils Absolute: 0.2 10*3/uL (ref 0.0–0.5)
Eosinophils Relative: 2 %
HCT: 38.2 % (ref 36.0–46.0)
Hemoglobin: 12.6 g/dL (ref 12.0–15.0)
Immature Granulocytes: 1 %
Lymphocytes Relative: 58 %
Lymphs Abs: 4.2 10*3/uL — ABNORMAL HIGH (ref 0.7–4.0)
MCH: 30.6 pg (ref 26.0–34.0)
MCHC: 33 g/dL (ref 30.0–36.0)
MCV: 92.7 fL (ref 80.0–100.0)
Monocytes Absolute: 0.5 10*3/uL (ref 0.1–1.0)
Monocytes Relative: 6 %
Neutro Abs: 2.3 10*3/uL (ref 1.7–7.7)
Neutrophils Relative %: 32 %
Platelets: 276 10*3/uL (ref 150–400)
RBC: 4.12 MIL/uL (ref 3.87–5.11)
RDW: 12.8 % (ref 11.5–15.5)
WBC: 7.3 10*3/uL (ref 4.0–10.5)
nRBC: 0 % (ref 0.0–0.2)

## 2022-03-08 LAB — COMPREHENSIVE METABOLIC PANEL
ALT: 18 U/L (ref 0–44)
AST: 15 U/L (ref 15–41)
Albumin: 3.5 g/dL (ref 3.5–5.0)
Alkaline Phosphatase: 72 U/L (ref 38–126)
Anion gap: 8 (ref 5–15)
BUN: 11 mg/dL (ref 6–20)
CO2: 25 mmol/L (ref 22–32)
Calcium: 8.3 mg/dL — ABNORMAL LOW (ref 8.9–10.3)
Chloride: 107 mmol/L (ref 98–111)
Creatinine, Ser: 0.53 mg/dL (ref 0.44–1.00)
GFR, Estimated: >60 mL/min (ref >60)
Glucose, Bld: 83 mg/dL (ref 70–99)
Potassium: 3.8 mmol/L (ref 3.5–5.1)
Sodium: 140 mmol/L (ref 135–145)
Total Bilirubin: 0.3 mg/dL (ref 0.3–1.2)
Total Protein: 6.4 g/dL — ABNORMAL LOW (ref 6.5–8.1)

## 2022-03-08 LAB — ACETAMINOPHEN LEVEL: Acetaminophen (Tylenol), Serum: <10 ug/mL — ABNORMAL LOW (ref 10–30)

## 2022-03-08 LAB — SALICYLATE LEVEL: Salicylate Lvl: <7 mg/dL — ABNORMAL LOW (ref 7.0–30.0)

## 2022-03-08 LAB — RESP PANEL BY RT-PCR (FLU A&B, COVID) ARPGX2
Influenza A by PCR: NEGATIVE
Influenza B by PCR: NEGATIVE
SARS Coronavirus 2 by RT PCR: NEGATIVE

## 2022-03-08 LAB — CBG MONITORING, ED: Glucose-Capillary: 88 mg/dL (ref 70–99)

## 2022-03-08 LAB — ETHANOL: Alcohol, Ethyl (B): 76 mg/dL — ABNORMAL HIGH (ref <10)

## 2022-03-08 MED ORDER — RIVAROXABAN 20 MG PO TABS
20.0000 mg | ORAL_TABLET | Freq: Every day | ORAL | Status: DC
  Administered 2022-03-08 – 2022-03-09 (×2): 20 mg via ORAL
  Filled 2022-03-08 (×2): qty 1

## 2022-03-08 MED ORDER — SERTRALINE HCL 50 MG PO TABS
25.0000 mg | ORAL_TABLET | Freq: Every day | ORAL | Status: DC
  Administered 2022-03-08 – 2022-03-09 (×2): 25 mg via ORAL
  Filled 2022-03-08 (×2): qty 1

## 2022-03-08 MED ORDER — ARIPIPRAZOLE 5 MG PO TABS
5.0000 mg | ORAL_TABLET | Freq: Every day | ORAL | Status: DC
  Administered 2022-03-08 – 2022-03-09 (×2): 5 mg via ORAL
  Filled 2022-03-08 (×2): qty 1

## 2022-03-08 NOTE — ED Notes (Addendum)
Pt is dressed out in [facility] required scrubs  Pt aware we need urine sample but advises she cannot urinate at this time

## 2022-03-08 NOTE — ED Notes (Signed)
Breakfast tray delivered

## 2022-03-08 NOTE — ED Notes (Signed)
Patient is resting comfortably with no needs identified at this time.

## 2022-03-08 NOTE — ED Provider Notes (Signed)
Emergency Medicine Observation Re-evaluation Note  Kristina Owens is a 56 y.o. female, seen on rounds today.  Pt initially presented to the ED for complaints of Psychiatric Evaluation Currently, the patient is sleeping.  Physical Exam  BP 94/64 (BP Location: Right Arm)   Pulse 72   Temp 98 F (36.7 C) (Oral)   Resp 18   Ht 5\' 3"  (1.6 m)   Wt 54.4 kg   SpO2 100%   BMI 21.26 kg/m  Physical Exam General: Sleeping Cardiac: Extremities are well perfused Lungs: Breathing is unlabored Psych: Deferred  ED Course / MDM  EKG:EKG Interpretation  Date/Time:  Saturday March 08 2022 01:50:40 EDT Ventricular Rate:  69 PR Interval:  140 QRS Duration: 87 QT Interval:  403 QTC Calculation: 432 R Axis:   63 Text Interpretation: Sinus rhythm Confirmed by 09-17-2002 (Kristina Owens) on 03/08/2022 2:15:29 AM  I have reviewed the labs performed to date as well as medications administered while in observation.  Recent changes in the last 24 hours include patient presented to the ED last night for suicidal ideation.  She is currently awaiting TTS evaluation.  Plan  Current plan is for TTS evaluation.  Kristina Owens is not under involuntary commitment.     [place], MD 03/09/22 [phone]

## 2022-03-08 NOTE — Consult Note (Signed)
[facility] ED ASSESSMENT   Reason for Consult:  Psychiatry evaluation Referring Physician:  ER Physician Patient Identification: Kristina Owens MRN:  [id number] ED Chief Complaint: Major depressive disorder, recurrent, severe with psychotic features (HCC)  Diagnosis:  Principal Problem:   Major depressive disorder, recurrent, severe with psychotic features (HCC) Active Problems:   Cocaine use disorder, severe, dependence (HCC)   Alcohol abuse   ED Assessment Time Calculation: No data recorded  Subjective:   Kristina Owens is a 56 y.o. female patient admitted with significant hx of Bipolar disorder, PTSD, OCD,  and Polysubstance abuse . Brought in from [facility] for suicide ideation with c/o  AVH and had Alcohol on board.   Patient was discharged from the ER yesterday for Medical reason and was brought back for Suicide ideation.  HPI:  Patient continues to endorse suicide ideation with plan to shoot drugs into her vein.  Patient states"  I don't want to continue to live like this anymore"  Patient reported that she is "hooked on Cocaine and Alcohol and Methamphetamine"  Patient reported that she is more interested in Stimulants including Adderall.  She admitted that Crack Cocaine and Alcohol keeps making her feel hopeless, helpless and worthless because she cannot stop using.  Alcohol level on arrival was 76 and no UDS yet.  Patient admitted drinking alcohol daily  and drinks any type of Alcohol she can lay her hands on.  She rated depression 10/10 with 10 being severe depression.  She has not seen Psychiatrist or taken any medications in 1 year she says.  Patient is homeless, reported poor sleep and appetite.  She was sober and was staying at [facility] until last year she relapsed.  She was taking Lamictal, Aripiprazole , Zoloft , Prozac, Gabapentin before she relapsed and has not been on medications. Patient meets criteria for inpatient Psychiatry hospitalization for safety and stabilization.   We will start Zoloft and Aripiprazole for now.  Due to non compliance and homelessness we will not offer Lamictal but use Aripiprazole for her mood.  We  will seek bed placement at any hospital with available beds.  Past Psychiatric History: significant hx of Bipolar disorder, PTSD, OCD,  and Polysubstance abuse.  Patient denied previous inpatient Psychiatry care.  Was receiving  outpatient Mental healthcare at [place] but does not remember name of facility.  She denied previous suicide attempts.  Risk to Self or Others: Is the patient at risk to self? Yes Has the patient been a risk to self in the past 6 months? Yes Has the patient been a risk to self within the distant past? Yes Is the patient a risk to others? No Has the patient been a risk to others in the past 6 months? No Has the patient been a risk to others within the distant past? No  [place] Scale:  Flowsheet Row ED from 03/07/2022 in [facility] from 03/06/2022 in [facility] from 01/17/2022 in [facility]  C-SSRS RISK CATEGORY No Risk No Risk No Risk       AIMS:  , , ,  ,   ASAM:    Substance Abuse:     Past Medical History:  Past Medical History:  Diagnosis Date   Anxiety    Depression    DVT (deep vein thrombosis) in pregnancy    GERD (gastroesophageal reflux disease)    Ovarian cyst     Past Surgical History:  Procedure Laterality  Date   APPENDECTOMY  age 46   IR PTA VENOUS EXCEPT DIALYSIS CIRCUIT  01/16/2021   IR RADIOLOGIST EVAL & MGMT  03/19/2021   IR THROMBECT VENO MECH MOD SED  01/16/2021   IR US GUIDE VASC ACCESS LEFT  01/16/2021   IR VENO/EXT/UNI LEFT  01/16/2021   IR VENOCAVAGRAM IVC  01/16/2021   TONSILLECTOMY Bilateral age 65   Family History:  Family History  Problem Relation Age of Onset   Asthma Mother    COPD Mother    Cancer Father        thyroid cancer   Family Psychiatric  History:  Father attempted suicide but did not succeed.  Alcoholism, Grand mom, sister depression,   Social History:  Social History   Substance and Sexual Activity  Alcohol Use Yes     Social History   Substance and Sexual Activity  Drug Use Yes   Types: "Crack" cocaine, Cocaine, Methamphetamines   Comment: last used 4/31/22    Social History   Socioeconomic History   Marital status: Divorced    Spouse name: Not on file   Number of children: Not on file   Years of education: Not on file   Highest education level: Not on file  Occupational History   Not on file  Tobacco Use   Smoking status: Every Day    Packs/day: 0.50    Years: 35.00    Total pack years: 17.50    Types: Cigarettes   Smokeless tobacco: Never  Vaping Use   Vaping Use: Never used  Substance and Sexual Activity   Alcohol use: Yes   Drug use: Yes    Types: "Crack" cocaine, Cocaine, Methamphetamines    Comment: last used 4/31/22   Sexual activity: Not on file  Other Topics Concern   Not on file  Social History Narrative   Not on file   Social Determinants of Health   Financial Resource Strain: Not on file  Food Insecurity: Not on file  Transportation Needs: Not on file  Physical Activity: Not on file  Stress: Not on file  Social Connections: Not on file   Additional Social History:    Allergies:  No Known Allergies  Labs:  Results for orders placed or performed during the hospital encounter of 03/07/22 (from the past 48 hour(s))  Comprehensive metabolic panel     Status: Abnormal   Collection Time: 03/08/22  1:24 AM  Result Value Ref Range   Sodium 140 135 - 145 mmol/L   Potassium 3.8 3.5 - 5.1 mmol/L   Chloride 107 98 - 111 mmol/L   CO2 25 22 - 32 mmol/L   Glucose, Bld 83 70 - 99 mg/dL    Comment: Glucose reference range applies only to samples taken after fasting for at least 8 hours.   BUN 11 6 - 20 mg/dL   Creatinine, Ser 4.13 0.44 - 1.00 mg/dL   Calcium 8.3 (L) 8.9 - 10.3 mg/dL   Total  Protein 6.4 (L) 6.5 - 8.1 g/dL   Albumin 3.5 3.5 - 5.0 g/dL   AST 15 15 - 41 U/L   ALT 18 0 - 44 U/L   Alkaline Phosphatase 72 38 - 126 U/L   Total Bilirubin 0.3 0.3 - 1.2 mg/dL   GFR, Estimated >24 >40 mL/min    Comment: (NOTE) Calculated using the CKD-EPI Creatinine Equation (2021)    Anion gap 8 5 - 15    Comment: Performed at [facility], [address] [place], [place] [address]  Salicylate level     Status: Abnormal   Collection Time: 03/08/22  1:24 AM  Result Value Ref Range   Salicylate Lvl <7.0 (L) 7.0 - 30.0 mg/dL    Comment: Performed at [facility], [address] [place], [place] [address]  Acetaminophen level     Status: Abnormal   Collection Time: 03/08/22  1:24 AM  Result Value Ref Range   Acetaminophen (Tylenol), Serum <10 (L) 10 - 30 ug/mL    Comment: (NOTE) Therapeutic concentrations vary significantly. A range of 10-30 ug/mL  may be an effective concentration for many patients. However, some  are best treated at concentrations outside of this range. Acetaminophen concentrations >150 ug/mL at 4 hours after ingestion  and >50 ug/mL at 12 hours after ingestion are often associated with  toxic reactions.  Performed at [facility], [address] [place], [place] [address]   Ethanol     Status: Abnormal   Collection Time: 03/08/22  1:24 AM  Result Value Ref Range   Alcohol, Ethyl (B) 76 (H) <10 mg/dL    Comment: (NOTE) Lowest detectable limit for serum alcohol is 10 mg/dL.  For medical purposes only. Performed at [facility], [address] [place], [place] [address]   CBC WITH DIFFERENTIAL     Status: Abnormal   Collection Time: 03/08/22  1:24 AM  Result Value Ref Range   WBC 7.3 4.0 - 10.5 K/uL   RBC 4.12 3.87 - 5.11 MIL/uL   Hemoglobin 12.6 12.0 - 15.0 g/dL   HCT 21.338.2 08.636.0 - 57.846.0 %   MCV 92.7 80.0 - 100.0 fL   MCH 30.6 26.0 - 34.0 pg   MCHC 33.0 30.0 - 36.0 g/dL    RDW 46.912.8 62.911.5 - 52.815.5 %   Platelets 276 150 - 400 K/uL   nRBC 0.0 0.0 - 0.2 %   Neutrophils Relative % 32 %   Neutro Abs 2.3 1.7 - 7.7 K/uL   Lymphocytes Relative 58 %   Lymphs Abs 4.2 (H) 0.7 - 4.0 K/uL   Monocytes Relative 6 %   Monocytes Absolute 0.5 0.1 - 1.0 K/uL   Eosinophils Relative 2 %   Eosinophils Absolute 0.2 0.0 - 0.5 K/uL   Basophils Relative 1 %   Basophils Absolute 0.1 0.0 - 0.1 K/uL   Immature Granulocytes 1 %   Abs Immature Granulocytes 0.04 0.00 - 0.07 K/uL    Comment: Performed at [facility], [address] [place], [place] [address]  CBG monitoring, ED     Status: None   Collection Time: 03/08/22  1:30 AM  Result Value Ref Range   Glucose-Capillary 88 70 - 99 mg/dL    Comment: Glucose reference range applies only to samples taken after fasting for at least 8 hours.  Resp Panel by RT-PCR (Flu A&B, Covid) Anterior Nasal Swab     Status: None   Collection Time: 03/08/22  2:53 AM   Specimen: Anterior Nasal Swab  Result Value Ref Range   SARS Coronavirus 2 by RT PCR NEGATIVE NEGATIVE    Comment: (NOTE) SARS-CoV-2 target nucleic acids are NOT DETECTED.  The SARS-CoV-2 RNA is generally detectable in upper respiratory specimens during the acute phase of infection. The lowest concentration of SARS-CoV-2 viral copies this assay can detect is 138 copies/mL. A negative result does not preclude SARS-Cov-2 infection and should not be used as the sole basis for treatment or other patient  management decisions. A negative result may occur with  improper specimen collection/handling, submission of specimen other than nasopharyngeal swab, presence of viral mutation(s) within the areas targeted by this assay, and inadequate number of viral copies(<138 copies/mL). A negative result must be combined with clinical observations, patient history, and epidemiological information. The expected result is Negative.  Fact Sheet for Patients:   BloggerCourse.com  Fact Sheet for Healthcare Providers:  SeriousBroker.it  This test is no t yet approved or cleared by the [place] FDA and  has been authorized for detection and/or diagnosis of SARS-CoV-2 by FDA under an Emergency Use Authorization (EUA). This EUA will remain  in effect (meaning this test can be used) for the duration of the COVID-19 declaration under Section 564(b)(1) of the Act, 21 U.S.C.section 360bbb-3(b)(1), unless the authorization is terminated  or revoked sooner.       Influenza A by PCR NEGATIVE NEGATIVE   Influenza B by PCR NEGATIVE NEGATIVE    Comment: (NOTE) The Xpert Xpress SARS-CoV-2/FLU/RSV plus assay is intended as an aid in the diagnosis of influenza from Nasopharyngeal swab specimens and should not be used as a sole basis for treatment. Nasal washings and aspirates are unacceptable for Xpert Xpress SARS-CoV-2/FLU/RSV testing.  Fact Sheet for Patients: BloggerCourse.com  Fact Sheet for Healthcare Providers: SeriousBroker.it  This test is not yet approved or cleared by the [place] FDA and has been authorized for detection and/or diagnosis of SARS-CoV-2 by FDA under an Emergency Use Authorization (EUA). This EUA will remain in effect (meaning this test can be used) for the duration of the COVID-19 declaration under Section 564(b)(1) of the Act, 21 U.S.C. section 360bbb-3(b)(1), unless the authorization is terminated or revoked.  Performed at [facility], [address] [place], [place] [address]     Current Facility-Administered Medications  Medication Dose Route Frequency Provider Last Rate Last Admin   ARIPiprazole (ABILIFY) tablet 5 mg  5 mg Oral Daily Dahlia Byes C, NP   5 mg at 03/08/22 1311   rivaroxaban (XARELTO) tablet 20 mg  20 mg Oral Q supper Gloris Manchester, MD       sertraline (ZOLOFT) tablet  25 mg  25 mg Oral Daily Dahlia Byes C, NP   25 mg at 03/08/22 1311   Current Outpatient Medications  Medication Sig Dispense Refill   doxycycline (VIBRAMYCIN) 100 MG capsule Take 1 capsule (100 mg total) by mouth 2 (two) times daily. (Patient not taking: Reported on 01/18/2022) 28 capsule 0   ibuprofen (ADVIL) 200 MG tablet Take 1,000-1,200 mg by mouth 2 (two) times daily as needed (pain). (Patient not taking: Reported on 03/08/2022)     methocarbamol (ROBAXIN) 500 MG tablet Take 1 tablet (500 mg total) by mouth every 8 (eight) hours as needed for muscle spasms. (Patient not taking: Reported on 03/08/2022) 30 tablet 0   metroNIDAZOLE (FLAGYL) 500 MG tablet Take 1 tablet (500 mg total) by mouth 2 (two) times daily. (Patient not taking: Reported on 01/18/2022) 28 tablet 0   ondansetron (ZOFRAN-ODT) 4 MG disintegrating tablet Take 1 tablet (4 mg total) by mouth every 8 (eight) hours as needed for nausea or vomiting. (Patient not taking: Reported on 03/08/2022) 20 tablet 0   RIVAROXABAN (XARELTO) VTE STARTER PACK (15 & 20 MG) Follow package directions: Take one 15mg  tablet by mouth twice a day. On day 22, switch to one 20mg  tablet once a day. Take with food. (Patient not taking: Reported on 03/08/2022) 51 each 0  Musculoskeletal: Strength & Muscle Tone: within normal limits Gait & Station: normal Patient leans: Front   Psychiatric Specialty Exam: Presentation  General Appearance: Casual; Disheveled  Eye Contact:Fair  Speech:Clear and Coherent; Normal Rate  Speech Volume:Normal  Handedness:Right   Mood and Affect  Mood:Anxious; Depressed; Hopeless; Worthless  Affect:Congruent; Depressed   Thought Process  Thought Processes:Coherent; Goal Directed; Linear  Descriptions of Associations:Intact  Orientation:Full (Time, Place and Person)  Thought Content:Logical  History of Schizophrenia/Schizoaffective disorder:No  Duration of Psychotic Symptoms:No data  recorded Hallucinations:Hallucinations: Command Description of Command Hallucinations: Voices are giving her command to kill herself telling her she is worthless, voices tells her what to do everyday including doing anything to kill herself.  Ideas of Reference:None  Suicidal Thoughts:Suicidal Thoughts: Yes, Active SI Active Intent and/or Plan: With Intent; With Plan; With Access to Means; With Means to Carry Out  Homicidal Thoughts:Homicidal Thoughts: No   Sensorium  Memory:Immediate Good; Remote Fair; Recent Good  Judgment:Poor  Insight:Poor   Executive Functions  Concentration:Poor  Attention Span:Fair  Recall:Fair  Fund of Knowledge:Fair  Language:Good   Psychomotor Activity  Psychomotor Activity:Psychomotor Activity: Increased; Restlessness   Assets  Assets:Communication Skills    Sleep  Sleep:Sleep: Poor   Physical Exam: Physical Exam Vitals and nursing note reviewed.  Constitutional:      Appearance: Normal appearance.  HENT:     Head: Normocephalic and atraumatic.     Nose: Nose normal.  Cardiovascular:     Rate and Rhythm: Normal rate.  Pulmonary:     Effort: Pulmonary effort is normal.  Musculoskeletal:        General: Normal range of motion.     Cervical back: Normal range of motion.  Skin:    General: Skin is warm and dry.  Neurological:     Mental Status: She is alert and oriented to person, place, and time.    Review of Systems  Constitutional: Negative.   HENT: Negative.    Eyes: Negative.   Respiratory: Negative.    Cardiovascular: Negative.   Gastrointestinal: Negative.   Genitourinary: Negative.   Musculoskeletal: Negative.   Skin: Negative.   Neurological: Negative.   Endo/Heme/Allergies: Negative.   Psychiatric/Behavioral:  Positive for depression, hallucinations, substance abuse and suicidal ideas. The patient has insomnia.    Blood pressure 98/67, pulse 63, temperature 98.2 F (36.8 C), temperature source Oral,  resp. rate 16, height 5\' 3"  (1.6 m), weight 54.4 kg, SpO2 100 %. Body mass index is 21.26 kg/m.  Medical Decision Making: Patient meets criteria for inpatient Mental health hospitalization-Depressed, feelings of hopelessness worthlessness and dealing with AH voices telling to hurt herself.  Aripiprazole and Zoloft is started while waiting for bed.  Problem 1: Recurrent Major Depressive disorder,severe with Psychotic symptoms   Problem 2: Cocaine use disorder,severe use  Problem 3: Alcohol abuse.  Disposition:  admit, seek bed placement.  , NP-PMHNP-BC 03/08/2022 1:32 PM

## 2022-03-09 ENCOUNTER — Inpatient Hospital Stay (HOSPITAL_COMMUNITY): Admit: 2022-03-09 | Payer: No Payment, Other

## 2022-03-09 LAB — RAPID URINE DRUG SCREEN, HOSP PERFORMED
Amphetamines: NOT DETECTED
Barbiturates: NOT DETECTED
Benzodiazepines: NOT DETECTED
Cocaine: NOT DETECTED
Opiates: NOT DETECTED
Tetrahydrocannabinol: NOT DETECTED

## 2022-03-09 MED ORDER — NICOTINE 7 MG/24HR TD PT24
7.0000 mg | MEDICATED_PATCH | Freq: Every day | TRANSDERMAL | Status: DC
  Administered 2022-03-09: 7 mg via TRANSDERMAL
  Filled 2022-03-09: qty 1

## 2022-03-09 MED ORDER — METHOCARBAMOL 500 MG PO TABS
500.0000 mg | ORAL_TABLET | Freq: Four times a day (QID) | ORAL | Status: DC | PRN
  Administered 2022-03-09 (×2): 500 mg via ORAL
  Filled 2022-03-09 (×2): qty 1

## 2022-03-09 NOTE — ED Notes (Incomplete)
Report called to

## 2022-03-09 NOTE — Progress Notes (Signed)
CSW spoke with Britta Mccreedy who requested additional documentation for further review of this patient. CSW faxed assessment and providers notes for further review.  Crissie Reese, MSW, Lenice Pressman Phone: [phone] Disposition/TOC

## 2022-03-09 NOTE — Progress Notes (Signed)
Per Dahlia Byes, NP, patient meets criteria for inpatient treatment. There are no available beds at [facility] today. CSW faxed referrals to the following facilities for review:  [facility]  Pending - No Request Sent N/A [address], [place] [address] [phone] --  [facility]  Pending - No Request Sent N/A [address] [place] [address] [phone] --  [facility]  Pending - No Request Sent N/A [address] [place] [address] [phone] --  [facility]  Pending - No Request Sent N/A [address], [place] [address] [phone] --  [facility]  Pending - No Request Sent N/A [address] [place] [address] [phone] --  [facility]  Pending - No Request Sent N/A [address] [place] [address] [phone] --  [facility]  Pending - No Request Sent N/A [address], [place] [address] [phone] --  [facility]  Pending - No Request Sent N/A [name] [place] [address] [phone] --  [facility]  Pending - No Request Sent N/A [address] [name] [address] [phone] --  [facility]  Pending - No Request Sent N/A [address], [place] [address] [phone] --  [facility]  Pending - No Request Sent N/A [address] [place] [address] [phone] --   TTS will continue to seek bed placement.  Crissie Reese, MSW, Lenice Pressman Phone: [phone] Disposition/TOC

## 2022-03-09 NOTE — ED Notes (Addendum)
Fax sent to  [phone] [facility] per Julieanne Cotton NP of pts UA, ETOH, Preg test results and covid results sent also.

## 2022-03-09 NOTE — ED Provider Notes (Signed)
Emergency Medicine Observation Re-evaluation Note  Joslynn Jamroz is a 56 y.o. female, seen on rounds today.  Pt initially presented to the ED for complaints of Psychiatric Evaluation Currently, the patient is awake, resting in chair with no complaints.  Physical Exam  BP 112/67 (BP Location: Right Arm)   Pulse 60   Temp 97.7 F (36.5 C) (Oral)   Resp 16   Ht 5\' 3"  (1.6 m)   Wt 54.4 kg   SpO2 99%   BMI 21.26 kg/m  Physical Exam General: Awake, alert, nondistressed Cardiac: Extremities well-perfused Lungs: Breathing is unlabored Psych: No agitation  ED Course / MDM  EKG:EKG Interpretation  Date/Time:  Saturday March 08 2022 01:50:40 EDT Ventricular Rate:  69 PR Interval:  140 QRS Duration: 87 QT Interval:  403 QTC Calculation: 432 R Axis:   63 Text Interpretation: Sinus rhythm Confirmed by 09-17-2002 (Zadie Rhine) on 03/08/2022 2:15:29 AM  I have reviewed the labs performed to date as well as medications administered while in observation.  Recent changes in the last 24 hours include TTS reevaluation yesterday.  Inpatient psychiatric admission is recommended  Plan  Current plan is for inpatient psychiatric admission.  Allyssa Victorino Dike is not under involuntary commitment.     [place], MD 03/09/22 1335

## 2022-03-09 NOTE — Progress Notes (Signed)
CSW spoke with Kristina Owens with Kristina Owens. It was requested that a Medical Clearance note, UA, Blood Alcohol, and Pregnancy test result be faxed to the facility for further review.  Kristina Owens, MSW, Lenice Pressman Phone: [phone] Disposition/TOC

## 2022-03-09 NOTE — ED Notes (Signed)
Called safe transport. No answer. Left a call back number to arrange transport to [facility].

## 2022-03-09 NOTE — ED Notes (Signed)
Gave pt graham crackers and peanut butter and drink

## 2022-03-10 ENCOUNTER — Other Ambulatory Visit: Payer: Self-pay

## 2022-03-10 ENCOUNTER — Inpatient Hospital Stay (HOSPITAL_COMMUNITY)
Admission: AD | Admit: 2022-03-10 | Discharge: 2022-03-20 | DRG: 885 | Disposition: A | Discharge status: Home / Self Care | Payer: Federal, State, Local not specified - Other | Source: Intra-hospital | Attending: Psychiatry | Admitting: Psychiatry

## 2022-03-10 ENCOUNTER — Encounter (HOSPITAL_COMMUNITY): Payer: Self-pay | Admitting: Nurse Practitioner

## 2022-03-10 ENCOUNTER — Encounter (HOSPITAL_COMMUNITY): Payer: Self-pay

## 2022-03-10 DIAGNOSIS — M79605 Pain in left leg: Secondary | ICD-10-CM | POA: Diagnosis present

## 2022-03-10 DIAGNOSIS — K5900 Constipation, unspecified: Secondary | ICD-10-CM | POA: Diagnosis present

## 2022-03-10 DIAGNOSIS — N390 Urinary tract infection, site not specified: Secondary | ICD-10-CM | POA: Diagnosis present

## 2022-03-10 DIAGNOSIS — Z86718 Personal history of other venous thrombosis and embolism: Secondary | ICD-10-CM | POA: Diagnosis not present

## 2022-03-10 DIAGNOSIS — F1020 Alcohol dependence, uncomplicated: Secondary | ICD-10-CM | POA: Diagnosis present

## 2022-03-10 DIAGNOSIS — G2581 Restless legs syndrome: Secondary | ICD-10-CM | POA: Diagnosis present

## 2022-03-10 DIAGNOSIS — R45851 Suicidal ideations: Secondary | ICD-10-CM | POA: Diagnosis present

## 2022-03-10 DIAGNOSIS — Z86711 Personal history of pulmonary embolism: Secondary | ICD-10-CM | POA: Diagnosis not present

## 2022-03-10 DIAGNOSIS — F17210 Nicotine dependence, cigarettes, uncomplicated: Secondary | ICD-10-CM | POA: Diagnosis present

## 2022-03-10 DIAGNOSIS — Z7901 Long term (current) use of anticoagulants: Secondary | ICD-10-CM

## 2022-03-10 DIAGNOSIS — Y639 Failure in dosage during unspecified surgical and medical care: Secondary | ICD-10-CM | POA: Insufficient documentation

## 2022-03-10 DIAGNOSIS — F429 Obsessive-compulsive disorder, unspecified: Secondary | ICD-10-CM | POA: Diagnosis present

## 2022-03-10 DIAGNOSIS — G4700 Insomnia, unspecified: Secondary | ICD-10-CM | POA: Diagnosis present

## 2022-03-10 DIAGNOSIS — F4010 Social phobia, unspecified: Secondary | ICD-10-CM | POA: Diagnosis present

## 2022-03-10 DIAGNOSIS — F333 Major depressive disorder, recurrent, severe with psychotic symptoms: Secondary | ICD-10-CM | POA: Diagnosis present

## 2022-03-10 DIAGNOSIS — M549 Dorsalgia, unspecified: Secondary | ICD-10-CM | POA: Diagnosis present

## 2022-03-10 DIAGNOSIS — M25552 Pain in left hip: Secondary | ICD-10-CM | POA: Diagnosis present

## 2022-03-10 DIAGNOSIS — F4310 Post-traumatic stress disorder, unspecified: Secondary | ICD-10-CM | POA: Diagnosis present

## 2022-03-10 DIAGNOSIS — F411 Generalized anxiety disorder: Secondary | ICD-10-CM | POA: Diagnosis present

## 2022-03-10 DIAGNOSIS — F319 Bipolar disorder, unspecified: Secondary | ICD-10-CM | POA: Insufficient documentation

## 2022-03-10 DIAGNOSIS — R109 Unspecified abdominal pain: Secondary | ICD-10-CM | POA: Diagnosis present

## 2022-03-10 DIAGNOSIS — Z5900 Homelessness unspecified: Secondary | ICD-10-CM

## 2022-03-10 MED ORDER — ADULT MULTIVITAMIN W/MINERALS CH
1.0000 | ORAL_TABLET | Freq: Every day | ORAL | Status: DC
  Administered 2022-03-10 – 2022-03-20 (×11): 1 via ORAL
  Filled 2022-03-10 (×5): qty 1
  Filled 2022-03-10: qty 14
  Filled 2022-03-10 (×2): qty 1
  Filled 2022-03-10: qty 14
  Filled 2022-03-10 (×8): qty 1

## 2022-03-10 MED ORDER — METHOCARBAMOL 500 MG PO TABS
500.0000 mg | ORAL_TABLET | Freq: Four times a day (QID) | ORAL | Status: DC | PRN
  Administered 2022-03-10 – 2022-03-19 (×20): 500 mg via ORAL
  Filled 2022-03-10 (×8): qty 1
  Filled 2022-03-10: qty 10
  Filled 2022-03-10 (×2): qty 1
  Filled 2022-03-10: qty 10
  Filled 2022-03-10 (×11): qty 1

## 2022-03-10 MED ORDER — TRAZODONE HCL 50 MG PO TABS
50.0000 mg | ORAL_TABLET | Freq: Every evening | ORAL | Status: DC | PRN
  Administered 2022-03-10 – 2022-03-18 (×10): 50 mg via ORAL
  Filled 2022-03-10 (×7): qty 1
  Filled 2022-03-10: qty 14
  Filled 2022-03-10 (×4): qty 1

## 2022-03-10 MED ORDER — LORAZEPAM 1 MG PO TABS
1.0000 mg | ORAL_TABLET | Freq: Two times a day (BID) | ORAL | Status: AC
  Administered 2022-03-12 (×2): 1 mg via ORAL
  Filled 2022-03-10 (×2): qty 1

## 2022-03-10 MED ORDER — MAGNESIUM HYDROXIDE 400 MG/5ML PO SUSP: 30.0000 mL | Freq: Every day | ORAL | Status: DC | PRN

## 2022-03-10 MED ORDER — LORAZEPAM 1 MG PO TABS
1.0000 mg | ORAL_TABLET | Freq: Every day | ORAL | Status: AC
  Administered 2022-03-13: 1 mg via ORAL
  Filled 2022-03-10: qty 1

## 2022-03-10 MED ORDER — HYDROXYZINE HCL 25 MG PO TABS
25.0000 mg | ORAL_TABLET | Freq: Three times a day (TID) | ORAL | Status: DC | PRN
  Administered 2022-03-10 – 2022-03-17 (×11): 25 mg via ORAL
  Filled 2022-03-10 (×5): qty 1
  Filled 2022-03-10: qty 20
  Filled 2022-03-10 (×6): qty 1

## 2022-03-10 MED ORDER — ESCITALOPRAM OXALATE 10 MG PO TABS
10.0000 mg | ORAL_TABLET | Freq: Every day | ORAL | Status: DC
  Administered 2022-03-10 – 2022-03-16 (×7): 10 mg via ORAL
  Filled 2022-03-10 (×11): qty 1

## 2022-03-10 MED ORDER — LORAZEPAM 1 MG PO TABS
1.0000 mg | ORAL_TABLET | Freq: Three times a day (TID) | ORAL | Status: AC
  Administered 2022-03-11 (×3): 1 mg via ORAL
  Filled 2022-03-10 (×3): qty 1

## 2022-03-10 MED ORDER — ALUM & MAG HYDROXIDE-SIMETH 200-200-20 MG/5ML PO SUSP: 30.0000 mL | ORAL | Status: DC | PRN

## 2022-03-10 MED ORDER — THIAMINE HCL 100 MG PO TABS
100.0000 mg | ORAL_TABLET | Freq: Every day | ORAL | Status: DC
  Administered 2022-03-11 – 2022-03-20 (×10): 100 mg via ORAL
  Filled 2022-03-10 (×14): qty 1

## 2022-03-10 MED ORDER — LORAZEPAM 1 MG PO TABS
1.0000 mg | ORAL_TABLET | Freq: Four times a day (QID) | ORAL | Status: AC
  Administered 2022-03-10 (×3): 1 mg via ORAL
  Filled 2022-03-10 (×3): qty 1

## 2022-03-10 MED ORDER — LOPERAMIDE HCL 2 MG PO CAPS: 2.0000 mg | ORAL_CAPSULE | ORAL | Status: AC | PRN

## 2022-03-10 MED ORDER — NICOTINE 7 MG/24HR TD PT24
7.0000 mg | MEDICATED_PATCH | Freq: Every day | TRANSDERMAL | Status: DC
  Filled 2022-03-10 (×2): qty 1

## 2022-03-10 MED ORDER — THIAMINE HCL 100 MG/ML IJ SOLN
100.0000 mg | Freq: Once | INTRAMUSCULAR | Status: DC
  Filled 2022-03-10: qty 2

## 2022-03-10 MED ORDER — SERTRALINE HCL 25 MG PO TABS
25.0000 mg | ORAL_TABLET | Freq: Every day | ORAL | Status: DC
  Administered 2022-03-10: 25 mg via ORAL
  Filled 2022-03-10 (×2): qty 1

## 2022-03-10 MED ORDER — ENSURE ENLIVE PO LIQD
237.0000 mL | Freq: Two times a day (BID) | ORAL | Status: DC
  Administered 2022-03-10 – 2022-03-19 (×18): 237 mL via ORAL
  Filled 2022-03-10 (×25): qty 237

## 2022-03-10 MED ORDER — ONDANSETRON 4 MG PO TBDP: 4.0000 mg | ORAL_TABLET | Freq: Four times a day (QID) | ORAL | Status: AC | PRN

## 2022-03-10 MED ORDER — NICOTINE POLACRILEX 2 MG MT GUM
2.0000 mg | CHEWING_GUM | OROMUCOSAL | Status: DC | PRN
  Administered 2022-03-10: 2 mg via ORAL
  Filled 2022-03-10: qty 1

## 2022-03-10 MED ORDER — ACETAMINOPHEN 325 MG PO TABS
650.0000 mg | ORAL_TABLET | Freq: Four times a day (QID) | ORAL | Status: DC | PRN
  Administered 2022-03-13 – 2022-03-17 (×4): 650 mg via ORAL
  Filled 2022-03-10 (×4): qty 2

## 2022-03-10 MED ORDER — ARIPIPRAZOLE 5 MG PO TABS
5.0000 mg | ORAL_TABLET | Freq: Every day | ORAL | Status: DC
  Administered 2022-03-10 – 2022-03-13 (×4): 5 mg via ORAL
  Filled 2022-03-10 (×6): qty 1

## 2022-03-10 MED ORDER — RIVAROXABAN 20 MG PO TABS
20.0000 mg | ORAL_TABLET | Freq: Every day | ORAL | Status: DC
  Administered 2022-03-10 – 2022-03-19 (×10): 20 mg via ORAL
  Filled 2022-03-10: qty 1
  Filled 2022-03-10: qty 14
  Filled 2022-03-10: qty 1
  Filled 2022-03-10 (×2): qty 14
  Filled 2022-03-10 (×6): qty 1
  Filled 2022-03-10: qty 14
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
## 2022-03-10 NOTE — [facility] Group Notes (Signed)
Spiritual care group on grief and loss facilitated by chaplain Katy Nishawn Rotan, [facility]   Group Goal:   Support / Education around grief and loss   Members engage in facilitated group support and psycho-social education.   Group Description:   Following introductions and group rules, group members engaged in facilitated group dialog and support around topic of loss, with particular support around experiences of loss in their lives. Group Identified types of loss (relationships / self / things) and identified patterns, circumstances, and changes that precipitate losses. Reflected on thoughts / feelings around loss, normalized grief responses, and recognized variety in grief experience. Group noted Worden's four tasks of grief in discussion.   Group drew on Adlerian / Rogerian, narrative, MI,   Patient Progress: Did not attend.  
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
## 2022-03-10 NOTE — Tx Team (Signed)
Initial Treatment Plan 03/10/2022 2:02 AM Kristina Owens GLO:[id number]    PATIENT STRESSORS: Financial difficulties   Health problems   Medication change or noncompliance   Substance abuse   Traumatic event     PATIENT STRENGTHS: Average or above average intelligence  Communication skills    PATIENT IDENTIFIED PROBLEMS: Depression  Suicidal Ideation  Substance Abuse      "Work on my self esteem"           DISCHARGE CRITERIA:  Improved stabilization in mood, thinking, and/or behavior Motivation to continue treatment in a less acute level of care Need for constant or close observation no longer present Verbal commitment to aftercare and medication compliance Withdrawal symptoms are absent or subacute and managed without 24-hour nursing intervention  PRELIMINARY DISCHARGE PLAN: Attend 12-step recovery group Outpatient therapy Placement in alternative living arrangements  PATIENT/FAMILY INVOLVEMENT: This treatment plan has been presented to and reviewed with the patient, [name].  The patient and family have been given the opportunity to ask questions and make suggestions.  Juliann Pares, RN 03/10/2022, 2:02 AM

## 2022-03-10 NOTE — Progress Notes (Signed)
Nutrition Brief Note RD working remotely.   Patient identified on the Malnutrition Screening Tool (MST) Report; score of 2.0  Wt Readings from Last 15 Encounters:  03/10/22 53.1 kg  03/07/22 54.4 kg  03/06/22 54.4 kg  01/06/22 54.4 kg  01/05/22 54.4 kg  06/12/21 68 kg  03/21/21 69.4 kg  03/12/21 68 kg  03/07/21 67.6 kg  02/07/21 66.2 kg  02/06/21 68 kg  01/28/21 60 kg  01/08/21 68 kg  01/01/21 52.2 kg  11/27/20 68 kg    Body mass index is 20.73 kg/m. Patient meets criteria for normal weight based on current BMI. Weight today is 117 lb and weight on 6/4 was 120 lb. This indicates 3 lb weight loss (2.5% body weight) in the past 2 months; not significant for time frame.  Current diet order is Regular and patient is eating as desired at meals and snacks at this time. Labs and medications reviewed.   Ensure Plus High Protein ordered BID per ONS protocol; each supplement provides 350 kcal and 20 grams protein. Patient accepted the bottle of supplement offered to her this AM.  No nutrition interventions warranted at this time. If nutrition issues arise, please consult RD.      Trenton Gammon, MS, RD, LDN, CNSC Registered Dietitian II Inpatient Clinical Nutrition RD pager # and on-call/weekend pager # available in [facility]

## 2022-03-10 NOTE — Progress Notes (Signed)
   03/10/22 2100  Psych Admission Type (Psych Patients Only)  Admission Status Voluntary  Psychosocial Assessment  Patient Complaints Anxiety  Eye Contact Fair  Facial Expression Anxious  Affect Anxious;Depressed  Speech Soft  Interaction Assertive  Motor Activity Slow  Appearance/Hygiene Disheveled  Behavior Characteristics Appropriate to situation  Mood Depressed  Aggressive Behavior  Effect No apparent injury  Thought Process  Coherency WDL  Content WDL  Delusions WDL  Perception WDL  Hallucination None reported or observed  Judgment Impaired  Confusion None  Danger to Self  Current suicidal ideation? Passive  Self-Injurious Behavior Some self-injurious ideation observed or expressed.  No lethal plan expressed   Agreement Not to Harm Self Yes  Description of Agreement verbal contract for safety

## 2022-03-10 NOTE — Progress Notes (Signed)
Adult Psychoeducational Group Note  Date:  03/10/2022 Time:  8:17 PM  Group Topic/Focus:  Wrap-Up Group:   The focus of this group is to help patients review their daily goal of treatment and discuss progress on daily workbooks.  Participation Level:  Did Not Attend  Participation Quality:   Did Not Attend  Affect:   Did Not Attend  Cognitive:   Did not Attend  Insight: None  Engagement in Group:   Did Not Attend  Modes of Intervention:   Did Not Attend  Additional Comments:  Pt was encouraged to attend AA group but did not attend.  Felipa Furnace 03/10/2022, 8:17 PM
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
## 2022-03-10 NOTE — BH IP Treatment Plan (Signed)
Interdisciplinary Treatment and Diagnostic Plan Update  03/10/2022 Time of Session: 10:55am Yudit Modesitt MRN: [id number]  Principal Diagnosis: Major depressive disorder, recurrent episode, severe, with psychosis (HCC)  Secondary Diagnoses: Principal Problem:   Major depressive disorder, recurrent episode, severe, with psychosis (HCC) Active Problems:   GAD (generalized anxiety disorder)   Alcohol dependence (HCC)   Insomnia   Current Medications:  Current Facility-Administered Medications  Medication Dose Route Frequency Provider Last Rate Last Admin   acetaminophen (TYLENOL) tablet 650 mg  650 mg Oral Q6H PRN Welford Roche, Josephine C, NP       alum & mag hydroxide-simeth (MAALOX/MYLANTA) 200-200-20 MG/5ML suspension 30 mL  30 mL Oral Q4H PRN Welford Roche, Josephine C, NP       ARIPiprazole (ABILIFY) tablet 5 mg  5 mg Oral Daily Dahlia Byes C, NP   5 mg at 03/10/22 0847   escitalopram (LEXAPRO) tablet 10 mg  10 mg Oral Daily Sonny Dandy, NP   10 mg at 03/10/22 1232   feeding supplement (ENSURE ENLIVE / ENSURE PLUS) liquid 237 mL  237 mL Oral BID BM Bobbitt, Shalon E, NP   237 mL at 03/10/22 1100   hydrOXYzine (ATARAX) tablet 25 mg  25 mg Oral TID PRN Dahlia Byes C, NP   25 mg at 03/10/22 0142   loperamide (IMODIUM) capsule 2-4 mg  2-4 mg Oral PRN Sonny Dandy, NP       LORazepam (ATIVAN) tablet 1 mg  1 mg Oral QID Daneil Dan O, NP   1 mg at 03/10/22 1232   Followed by   Melene Muller ON 03/11/2022] LORazepam (ATIVAN) tablet 1 mg  1 mg Oral TID Sonny Dandy, NP       Followed by   Melene Muller ON 03/12/2022] LORazepam (ATIVAN) tablet 1 mg  1 mg Oral BID Sonny Dandy, NP       Followed by   Melene Muller ON 03/13/2022] LORazepam (ATIVAN) tablet 1 mg  1 mg Oral Daily Oloyede, Juliet O, NP       magnesium hydroxide (MILK OF MAGNESIA) suspension 30 mL  30 mL Oral Daily PRN Dahlia Byes C, NP       methocarbamol (ROBAXIN) tablet 500 mg  500 mg Oral Q6H PRN Dahlia Byes C,  NP   500 mg at 03/10/22 0849   multivitamin with minerals tablet 1 tablet  1 tablet Oral Daily Sonny Dandy, NP   1 tablet at 03/10/22 1231   nicotine polacrilex (NICORETTE) gum 2 mg  2 mg Oral PRN Sonny Dandy, NP   2 mg at 03/10/22 1117   ondansetron (ZOFRAN-ODT) disintegrating tablet 4 mg  4 mg Oral Q6H PRN Sonny Dandy, NP       rivaroxaban (XARELTO) tablet 20 mg  20 mg Oral Q supper Onuoha, Josephine C, NP       thiamine (VITAMIN B1) injection 100 mg  100 mg Intramuscular Once Sonny Dandy, NP       [START ON 03/11/2022] thiamine (VITAMIN B1) tablet 100 mg  100 mg Oral Daily Sonny Dandy, NP       traZODone (DESYREL) tablet 50 mg  50 mg Oral QHS PRN Dahlia Byes C, NP   50 mg at 03/10/22 0142   PTA Medications: Medications Prior to Admission  Medication Sig Dispense Refill Last Dose   doxycycline (VIBRAMYCIN) 100 MG capsule Take 1 capsule (100 mg total) by mouth 2 (two) times daily. (Patient not taking: Reported on 01/18/2022) 28 capsule 0  ibuprofen (ADVIL) 200 MG tablet Take 1,000-1,200 mg by mouth 2 (two) times daily as needed (pain). (Patient not taking: Reported on 03/08/2022)      methocarbamol (ROBAXIN) 500 MG tablet Take 1 tablet (500 mg total) by mouth every 8 (eight) hours as needed for muscle spasms. (Patient not taking: Reported on 03/08/2022) 30 tablet 0    metroNIDAZOLE (FLAGYL) 500 MG tablet Take 1 tablet (500 mg total) by mouth 2 (two) times daily. (Patient not taking: Reported on 01/18/2022) 28 tablet 0    ondansetron (ZOFRAN-ODT) 4 MG disintegrating tablet Take 1 tablet (4 mg total) by mouth every 8 (eight) hours as needed for nausea or vomiting. (Patient not taking: Reported on 03/08/2022) 20 tablet 0    RIVAROXABAN (XARELTO) VTE STARTER PACK (15 & 20 MG) Follow package directions: Take one 15mg  tablet by mouth twice a day. On day 22, switch to one 20mg  tablet once a day. Take with food. (Patient not taking: Reported on 03/08/2022) 51 each 0     Patient  Stressors: Financial difficulties   Health problems   Medication change or noncompliance   Substance abuse   Traumatic event    Patient Strengths: Average or above average intelligence  Communication skills   Treatment Modalities: Medication Management, Group therapy, Case management,  1 to 1 session with clinician, Psychoeducation, Recreational therapy.   Physician Treatment Plan for Primary Diagnosis: Major depressive disorder, recurrent episode, severe, with psychosis (HCC) Long Term Goal(s): Improvement in symptoms so as ready for discharge   Short Term Goals:    Medication Management: Evaluate patient's response, side effects, and tolerance of medication regimen.  Therapeutic Interventions: 1 to 1 sessions, Unit Group sessions and Medication administration.  Evaluation of Outcomes: Progressing  Physician Treatment Plan for Secondary Diagnosis: Principal Problem:   Major depressive disorder, recurrent episode, severe, with psychosis (HCC) Active Problems:   GAD (generalized anxiety disorder)   Alcohol dependence (HCC)   Insomnia  Long Term Goal(s): Improvement in symptoms so as ready for discharge   Short Term Goals:       Medication Management: Evaluate patient's response, side effects, and tolerance of medication regimen.  Therapeutic Interventions: 1 to 1 sessions, Unit Group sessions and Medication administration.  Evaluation of Outcomes: Progressing   RN Treatment Plan for Primary Diagnosis: Major depressive disorder, recurrent episode, severe, with psychosis (HCC) Long Term Goal(s): Knowledge of disease and therapeutic regimen to maintain health will improve  Short Term Goals: Ability to remain free from injury will improve, Ability to verbalize frustration and anger appropriately will improve, Ability to demonstrate self-control, Ability to participate in decision making will improve, Ability to verbalize feelings will improve, Ability to disclose and discuss  suicidal ideas, Ability to identify and develop effective coping behaviors will improve, and Compliance with prescribed medications will improve  Medication Management: RN will administer medications as ordered by provider, will assess and evaluate patient's response and provide education to patient for prescribed medication. RN will report any adverse and/or side effects to prescribing provider.  Therapeutic Interventions: 1 on 1 counseling sessions, Psychoeducation, Medication administration, Evaluate responses to treatment, Monitor vital signs and CBGs as ordered, Perform/monitor CIWA, COWS, AIMS and Fall Risk screenings as ordered, Perform wound care treatments as ordered.  Evaluation of Outcomes: Progressing   LCSW Treatment Plan for Primary Diagnosis: Major depressive disorder, recurrent episode, severe, with psychosis (HCC) Long Term Goal(s): Safe transition to appropriate next level of care at discharge, Engage patient in therapeutic group addressing interpersonal concerns.  Short  Term Goals: Engage patient in aftercare planning with referrals and resources, Increase social support, Increase ability to appropriately verbalize feelings, Increase emotional regulation, Facilitate acceptance of mental health diagnosis and concerns, Facilitate patient progression through stages of change regarding substance use diagnoses and concerns, Identify triggers associated with mental health/substance abuse issues, and Increase skills for wellness and recovery  Therapeutic Interventions: Assess for all discharge needs, 1 to 1 time with Social worker, Explore available resources and support systems, Assess for adequacy in community support network, Educate family and significant other(s) on suicide prevention, Complete Psychosocial Assessment, Interpersonal group therapy.  Evaluation of Outcomes: Progressing   Progress in Treatment: Attending groups: Yes. Participating in groups: Yes. Taking medication  as prescribed: Yes. Toleration medication: Yes. Family/Significant other contact made: No, will contact:  friend French Ana [phone] Patient understands diagnosis: Yes. Discussing patient identified problems/goals with staff: Yes. Medical problems stabilized or resolved: Yes. Denies suicidal/homicidal ideation: Yes. Issues/concerns per patient self-inventory: No.  New problem(s) identified: No, Describe:  none reported   New Short Term/Long Term Goal(s): detox, medication management for mood stabilization; elimination of SI thoughts; development of comprehensive mental wellness/sobriety plan    Patient Goals:  Pt states, "I want to be stabilized on meds and go to rehab."  Discharge Plan or Barriers: Patient recently admitted. CSW will continue to follow and assess for appropriate referrals and possible discharge planning.    Reason for Continuation of Hospitalization: Anxiety Depression Hallucinations Medication stabilization Suicidal ideation Withdrawal symptoms  Estimated Length of Stay:  Last 3 [place] Suicide Severity Risk Score: Flowsheet Row [facility] (Current[facility] from 03/07/2022 in [facility] from 03/06/2022 in [facility]  C-SSRS RISK CATEGORY No Risk No Risk No Risk       Last PHQ 2/9 Scores:    03/12/2021    4:35 PM  Depression screen PHQ 2/9  Decreased Interest 2  Down, Depressed, Hopeless 3  PHQ - 2 Score 5  Altered sleeping 3  Tired, decreased energy 3  Change in appetite 3  Feeling bad or failure about yourself  3  Trouble concentrating 3  Moving slowly or fidgety/restless 2  Suicidal thoughts 0  PHQ-9 Score 22    Scribe for Treatment Team: Beatris Si, LCSW 03/10/2022 3:11 PM

## 2022-03-11 DIAGNOSIS — Z780 Asymptomatic menopausal state: Secondary | ICD-10-CM | POA: Insufficient documentation

## 2022-03-11 LAB — HEPATITIS PANEL, ACUTE
HCV Ab: NONREACTIVE
Hep A IgM: NONREACTIVE
Hep B C IgM: NONREACTIVE
Hepatitis B Surface Ag: NONREACTIVE

## 2022-03-11 LAB — I-STAT BETA HCG BLOOD, ED (MC, WL, AP ONLY): I-stat hCG, quantitative: <5 m[IU]/mL (ref <5)

## 2022-03-11 LAB — VITAMIN B12: Vitamin B-12: 201 pg/mL (ref 180–914)

## 2022-03-11 LAB — MAGNESIUM: Magnesium: 1.9 mg/dL (ref 1.7–2.4)

## 2022-03-11 LAB — RPR: RPR Ser Ql: NONREACTIVE

## 2022-03-11 LAB — VITAMIN D 25 HYDROXY (VIT D DEFICIENCY, FRACTURES): Vit D, 25-Hydroxy: 31 ng/mL (ref 30–100)

## 2022-03-11 LAB — TSH: TSH: 3.487 u[IU]/mL (ref 0.350–4.500)

## 2022-03-11 MED ORDER — IBUPROFEN 600 MG PO TABS
600.0000 mg | ORAL_TABLET | Freq: Four times a day (QID) | ORAL | Status: DC | PRN
  Administered 2022-03-11 – 2022-03-20 (×13): 600 mg via ORAL
  Filled 2022-03-11 (×13): qty 1

## 2022-03-11 MED ORDER — NICOTINE 21 MG/24HR TD PT24
21.0000 mg | MEDICATED_PATCH | Freq: Every day | TRANSDERMAL | Status: DC
  Administered 2022-03-11 – 2022-03-20 (×9): 21 mg via TRANSDERMAL
  Filled 2022-03-11: qty 1
  Filled 2022-03-11: qty 14
  Filled 2022-03-11 (×8): qty 1
  Filled 2022-03-11: qty 14
  Filled 2022-03-11: qty 1
  Filled 2022-03-11: qty 14
  Filled 2022-03-11: qty 1
  Filled 2022-03-11: qty 14

## 2022-03-11 NOTE — Progress Notes (Signed)
   03/11/22 2045  Psych Admission Type (Psych Patients Only)  Admission Status Voluntary  Psychosocial Assessment  Patient Complaints Anxiety;Depression  Eye Contact Fair  Facial Expression Anxious  Affect Anxious;Depressed  Speech Soft  Interaction Assertive  Motor Activity Slow  Appearance/Hygiene Disheveled  Behavior Characteristics Cooperative  Mood Depressed  Aggressive Behavior  Effect No apparent injury  Thought Process  Coherency WDL  Content WDL  Delusions WDL  Perception WDL  Hallucination None reported or observed  Judgment Impaired  Confusion None  Danger to Self  Current suicidal ideation? Passive  Self-Injurious Behavior Some self-injurious ideation observed or expressed.  No lethal plan expressed   Agreement Not to Harm Self Yes  Description of Agreement verbal contract for safety

## 2022-03-11 NOTE — Plan of Care (Signed)
Nurse discussed coping skills with patient.  

## 2022-03-11 NOTE — Group Note (Signed)
Date:  03/11/2022 Time:  9:17 AM  Group Topic/Focus:  Orientation:   The focus of this group is to educate the patient on the purpose and policies of crisis stabilization and provide a format to answer questions about their admission.  The group details unit policies and expectations of patients while admitted.    Participation Level:  Did Not Attend  Participation Quality:    Affect:    Cognitive:    Insight:   Engagement in Group:    Modes of Intervention:    Additional Comments:  Pt did not attend group after encouraged to do so.  Jaquita Rector 03/11/2022, 9:17 AM

## 2022-03-11 NOTE — Progress Notes (Signed)
[facility] MD Progress Note  03/11/2022 3:16 PM Kristina Owens  MRN:  [id number]  History of Present Illness: The patient is a 56 years old Caucasian who presents to the hospital for suicidal ideation, increased Depression with Psychosis.  The patient states "look I have a lot of mental health issues, physical health problems, I'm homeless, I started hearing voices, I'm an alcoholic, my mind is not right and I wish someone will pump some overdose drugs through through IV and will leave this world. I need help." States she is okay if she sleeps and never wake up.  24 hour EMR reviewed. Case discussed in progression rounds.  PRNs: hydroxyzine for anxiety, robaxin for cramping, trazodone for sleep, nicorette gum for nicotine craving  Subjective:  Kristina Owens is seen this afternoon on rounds. She is not feeling well due to lower left abdomen and pelvic pain. She has not had a period in 3 years since menopause. She had a BM today that was normal. She is urinating regularly. She is able to eat okay. She is not sleeping very well. She is still hearing the voices. Mood is unchanged. She is anxious about rehab, wants to stay close enough to make her court date. She continues to have passive SI but is able to contract for safety.   Principal Problem: Major depressive disorder, recurrent episode, severe, with psychosis (HCC) Diagnosis: Principal Problem:   Major depressive disorder, recurrent episode, severe, with psychosis (HCC) Active Problems:   GAD (generalized anxiety disorder)   Alcohol dependence (HCC)   Insomnia  Total Time spent with patient: 30 minutes  Past Psychiatric History: MDD, PTSD, OCD (check on things repeatedly) & ADHD  Past Medical History:  Past Medical History:  Diagnosis Date   Anxiety    Depression    DVT (deep vein thrombosis) in pregnancy    GERD (gastroesophageal reflux disease)    Ovarian cyst     Past Surgical History:  Procedure Laterality Date   APPENDECTOMY  age 20    IR PTA VENOUS EXCEPT DIALYSIS CIRCUIT  01/16/2021   IR RADIOLOGIST EVAL & MGMT  03/19/2021   IR THROMBECT VENO MECH MOD SED  01/16/2021   IR US GUIDE VASC ACCESS LEFT  01/16/2021   IR VENO/EXT/UNI LEFT  01/16/2021   IR VENOCAVAGRAM IVC  01/16/2021   TONSILLECTOMY Bilateral age 62   Family History:  Family History  Problem Relation Age of Onset   Asthma Mother    COPD Mother    Cancer Father        thyroid cancer   Family Psychiatric  History: denies Social History:  Social History   Substance and Sexual Activity  Alcohol Use Yes     Social History   Substance and Sexual Activity  Drug Use Yes   Types: "Crack" cocaine, Cocaine, Methamphetamines   Comment: last used 4/31/22    Social History   Socioeconomic History   Marital status: Divorced    Spouse name: Not on file   Number of children: Not on file   Years of education: Not on file   Highest education level: Not on file  Occupational History   Not on file  Tobacco Use   Smoking status: Every Day    Packs/day: 0.50    Years: 35.00    Total pack years: 17.50    Types: Cigarettes   Smokeless tobacco: Never  Vaping Use   Vaping Use: Never used  Substance and Sexual Activity   Alcohol use: Yes   Drug  use: Yes    Types: "Crack" cocaine, Cocaine, Methamphetamines    Comment: last used 4/31/22   Sexual activity: Not on file  Other Topics Concern   Not on file  Social History Narrative   Not on file   Social Determinants of Health   Financial Resource Strain: Not on file  Food Insecurity: Not on file  Transportation Needs: Not on file  Physical Activity: Not on file  Stress: Not on file  Social Connections: Not on file   Additional Social History: divorced. Currently homeless. Has court on Sept 1st.                         Sleep: Poor  Appetite:  Fair  Current Medications: Current Facility-Administered Medications  Medication Dose Route Frequency Provider Last Rate Last Admin   acetaminophen  (TYLENOL) tablet 650 mg  650 mg Oral Q6H PRN Charmaine Downs C, NP       alum & mag hydroxide-simeth (MAALOX/MYLANTA) 200-200-20 MG/5ML suspension 30 mL  30 mL Oral Q4H PRN Cleatrice Burke, Josephine C, NP       ARIPiprazole (ABILIFY) tablet 5 mg  5 mg Oral Daily Onuoha, Josephine C, NP   5 mg at 03/11/22 0900   escitalopram (LEXAPRO) tablet 10 mg  10 mg Oral Daily Dione Housekeeper O, NP   10 mg at 03/11/22 0900   feeding supplement (ENSURE ENLIVE / ENSURE PLUS) liquid 237 mL  237 mL Oral BID BM Bobbitt, Shalon E, NP   237 mL at 03/11/22 1100   hydrOXYzine (ATARAX) tablet 25 mg  25 mg Oral TID PRN Charmaine Downs C, NP   25 mg at 03/10/22 2127   loperamide (IMODIUM) capsule 2-4 mg  2-4 mg Oral PRN Kai Levins, NP       LORazepam (ATIVAN) tablet 1 mg  1 mg Oral TID Kai Levins, NP   1 mg at 03/11/22 1206   Followed by   Derrill Memo ON 03/12/2022] LORazepam (ATIVAN) tablet 1 mg  1 mg Oral BID Kai Levins, NP       Followed by   Derrill Memo ON 03/13/2022] LORazepam (ATIVAN) tablet 1 mg  1 mg Oral Daily Oloyede, Juliet O, NP       magnesium hydroxide (MILK OF MAGNESIA) suspension 30 mL  30 mL Oral Daily PRN Charmaine Downs C, NP       methocarbamol (ROBAXIN) tablet 500 mg  500 mg Oral Q6H PRN Charmaine Downs C, NP   500 mg at 03/11/22 [id number]   multivitamin with minerals tablet 1 tablet  1 tablet Oral Daily Dione Housekeeper O, NP   1 tablet at 03/11/22 0900   nicotine (NICODERM CQ - dosed in mg/24 hours) patch 21 mg  21 mg Transdermal Daily Dione Housekeeper O, NP   21 mg at 03/11/22 1000   ondansetron (ZOFRAN-ODT) disintegrating tablet 4 mg  4 mg Oral Q6H PRN Kai Levins, NP       rivaroxaban (XARELTO) tablet 20 mg  20 mg Oral Q supper Onuoha, Josephine C, NP   20 mg at 03/10/22 1628   thiamine (VITAMIN B1) injection 100 mg  100 mg Intramuscular Once Dione Housekeeper O, NP       thiamine (VITAMIN B1) tablet 100 mg  100 mg Oral Daily Oloyede, Juliet O, NP   100 mg at 03/11/22 0900   traZODone  (DESYREL) tablet 50 mg  50 mg Oral QHS PRN Delfin Gant, NP  50 mg at 03/10/22 2127    Lab Results:  Results for orders placed or performed during the hospital encounter of 03/10/22 (from the past 48 hour(s))  Magnesium     Status: None   Collection Time: 03/11/22  6:26 AM  Result Value Ref Range   Magnesium 1.9 1.7 - 2.4 mg/dL    Comment: Performed at [facility], [place] [address] [place], [place] [address]  Hepatitis panel, acute     Status: None   Collection Time: 03/11/22  6:26 AM  Result Value Ref Range   Hepatitis B Surface Ag NON REACTIVE NON REACTIVE   HCV Ab NON REACTIVE NON REACTIVE    Comment: (NOTE) Nonreactive HCV antibody screen is consistent with no HCV infections,  unless recent infection is suspected or other evidence exists to indicate HCV infection.     Hep A IgM NON REACTIVE NON REACTIVE   Hep B C IgM NON REACTIVE NON REACTIVE    Comment: Performed at [facility], [place] [address] [place], [place] [address]  VITAMIN D 25 Hydroxy (Vit-D Deficiency, Fractures)     Status: None   Collection Time: 03/11/22  6:26 AM  Result Value Ref Range   Vit D, 25-Hydroxy 31.00 30 - 100 ng/mL    Comment: (NOTE) Vitamin D deficiency has been defined by the [place] practice guideline as a level of serum 25-OH  vitamin D less than 20 ng/mL (1,2). The Endocrine Society went on to  further define vitamin D insufficiency as a level between 21 and 29  ng/mL (2).  1. IOM (Institute of Medicine). 2010. Dietary reference intakes for  calcium and D. [place]: The [employer]. 2. Holick MF, Binkley [place], Bischoff-Ferrari HA, et al. Evaluation,  treatment, and prevention of vitamin D deficiency: an Endocrine  Society clinical practice guideline, JCEM. 2011 Jul; 96(7): 1911-30.  Performed at [facility], [place] [address], [place], [place] [address]   Vitamin B12     Status: None   Collection  Time: 03/11/22  6:26 AM  Result Value Ref Range   Vitamin B-12 201 180 - 914 pg/mL    Comment: (NOTE) This assay is not validated for testing neonatal or myeloproliferative syndrome specimens for Vitamin B12 levels. Performed at [facility], [place] [address], [place], [place] [address]   TSH     Status: None   Collection Time: 03/11/22  6:26 AM  Result Value Ref Range   TSH 3.487 0.350 - 4.500 uIU/mL    Comment: Performed by a 3rd Generation assay with a functional sensitivity of <=0.01 uIU/mL. Performed at [facility], [place] [address], [place], [place] [address]   RPR     Status: None   Collection Time: 03/11/22  6:26 AM  Result Value Ref Range   RPR Ser Ql NON REACTIVE NON REACTIVE    Comment: Performed at [facility], [place] [address] [place], [place] [address]    Blood Alcohol level:  Lab Results  Component Value Date   ETH 76 (H) 03/08/2022   ETH <10 [id number]    Metabolic Disorder Labs: No results found for: "HGBA1C", "MPG" No results found for: "PROLACTIN" No results found for: "CHOL", "TRIG", "HDL", "CHOLHDL", "VLDL", "LDLCALC"  Physical Findings: AIMS:  , ,  ,  ,    CIWA:  CIWA-Ar Total: 1 COWS:     Musculoskeletal: Strength & Muscle Tone: within normal  limits Gait & Station: normal Patient leans: N/A  Psychiatric Specialty Exam:  Presentation  General Appearance: Appropriate for Environment  Eye Contact:Fair  Speech:Normal Rate  Speech Volume:Normal  Handedness:Right   Mood and Affect  Mood:Depressed; Anxious  Affect:Congruent   Thought Process  Thought Processes:Linear  Descriptions of Associations:Intact  Orientation:Full (Time, Place and Person)  Thought Content:Logical  History of Schizophrenia/Schizoaffective disorder:No  Duration of Psychotic Symptoms:No data recorded Hallucinations:Hallucinations: Auditory  Ideas of Reference:None  Suicidal Thoughts:Suicidal Thoughts: Yes,  Passive SI Active Intent and/or Plan: With Plan SI Passive Intent and/or Plan: Without Intent  Homicidal Thoughts:Homicidal Thoughts: No   Sensorium  Memory:Immediate Good; Recent Fair; Remote Fair  Judgment:Fair  Insight:Fair   Executive Functions  Concentration:Fair  Attention Span:Fair  Recall:Fair  Fund of Knowledge:Fair  Language:Fair   Psychomotor Activity  Psychomotor Activity:Psychomotor Activity: Normal   Assets  Assets:Communication Skills   Sleep  Sleep:Sleep: Poor Number of Hours of Sleep: 4    Physical Exam: Physical Exam Vitals and nursing note reviewed.  Constitutional:      Appearance: Normal appearance.  HENT:     Head: Normocephalic.     Nose: Nose normal.  Eyes:     Extraocular Movements: Extraocular movements intact.  Pulmonary:     Effort: Pulmonary effort is normal.  Abdominal:     General: Abdomen is flat.  Musculoskeletal:        General: Normal range of motion.     Cervical back: Normal range of motion.  Neurological:     General: No focal deficit present.     Mental Status: She is alert and oriented to person, place, and time.    Review of Systems  Constitutional:  Negative for fever.  HENT:  Negative for hearing loss.   Respiratory:  Negative for cough.   Cardiovascular:  Negative for chest pain and palpitations.  Gastrointestinal:  Positive for abdominal pain. Negative for constipation, diarrhea, nausea and vomiting.  Genitourinary:  Negative for dysuria and frequency.  Musculoskeletal:  Positive for back pain, joint pain and myalgias.  Skin:  Negative for rash.  Neurological:  Negative for dizziness and headaches.  Psychiatric/Behavioral:  Positive for depression, hallucinations and suicidal ideas. Negative for memory loss. The patient is nervous/anxious and has insomnia.    Blood pressure 113/83, pulse 79, temperature 98.1 F (36.7 C), temperature source Oral, resp. rate 16, height 5\' 3"  (1.6 m), weight 53.1 kg,  SpO2 100 %. Body mass index is 20.73 kg/m.   Treatment Plan Summary: Daily contact with patient to assess and evaluate symptoms and progress in treatment and Medication management  Medications: Mood/anxiety: continue group therapy, milieu therapy, 1:1 evaluation with provider.  Abilify 5mg  PO daily antidepressant augment and mood stabilizer Lexapro 10mg  PO daily for depression and anxiety Hydroxyzine PRN anxiety Substance Abuse: brief intervention provided abstinence advised.  Continue ativan taper, thiamine replacement Rehab referral pending Abdominal/pelvic pain:  KUB and pelvic XR pending.  Ibuprofen ordered PRN cramping Medical: PRNs for pain, constipation, indigestion available.  Continue xarelto for history DVT and saddle embolus Safety and Monitoring: voluntary admission to inpatient psychiatric unit for safety, stabilization and treatment Daily contact with patient to assess and evaluate symptoms and progress in treatment Patient's case to be discussed in multi-disciplinary team meeting Observation Level : q15 minute checks Vital signs: q12 hours Precautions: suicide, withdrawal    , MD 03/11/2022, 3:16 PM
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
## 2022-03-11 NOTE — Progress Notes (Signed)
   03/11/22 0515  Sleep  Number of Hours 6.5    

## 2022-03-11 NOTE — Progress Notes (Signed)
Adult Psychoeducational Group Note  Date:  03/11/2022 Time:  9:16 PM  Group Topic/Focus:  Wrap-Up Group:   The focus of this group is to help patients review their daily goal of treatment and discuss progress on daily workbooks.  Participation Level:  Did Not Attend  Participation Quality:   Did Not Attend  Affect:   Did Not Attend  Cognitive:   Did Not Attend  Insight: None  Engagement in Group:   Did Not Attend  Modes of Intervention:   Did Not Attend  Additional Comments:  Pt was encouraged to attend wrap up group but did not attend.  Felipa Furnace 03/11/2022, 9:16 PM

## 2022-03-11 NOTE — Progress Notes (Signed)
D:  Patient's self inventory sheet, patient has fair sleep, ativan helpful.  Fair appetite, low energy level, poor concentration.  Rated depression and hopeless 9, anxiety 6.  Withdrawals, cravings, cramps, agitation, nausea, irritability.  SI at times, contracts for safety, no plan.  Physical problems, lightheaded, pain, dizzy, headaches.  Calf, ovary, kidney, liver, L side hurts.  Goal is feel better physically and mentally, emotionally.  Plans to eat, rest, participate.  "I am grateful to have some help.  Would be grateful for help after discharge."  A:  Medications administered per MD orders.  Emotional support and encouragement given patient. R:  Denied SI and HI, contracts for safety.  Denied A/V hallucinations.  Safety maintained with 15 minute checks.

## 2022-03-11 NOTE — Group Note (Signed)
Recreation Therapy Group Note   Group Topic:Animal Assisted Therapy   Group Date: 03/11/2022 Start Time: 1425 End Time: 1500 Facilitators: Elysse Polidore, Benito Mccreedy, LRT Location: [address] Dayroom  Animal-Assisted Activity (AAA) Program Checklist/Progress Notes Patient Eligibility Criteria Checklist & Daily Group note for Rec Tx Intervention   AAA/T Program Assumption of Risk Form signed by Patient/ or Parent Legal Guardian YES  Patient is free of allergies or severe asthma  YES  Patient reports no fear of animals YES  Patient reports no history of cruelty to animals YES  Patient understands their participation is voluntary YES   Group Description: Patients provided opportunity to interact with trained and credentialed [employer] Therapy dog and the community volunteer/dog handler.   Affect/Mood: N/A   Participation Level: Did not attend    Clinical Observations/Individualized Feedback: Jaylah did not participate in AAA programming offered on unit.    Benito Mccreedy Ramone Gander, LRT, CTRS 03/11/2022 3:39 PM

## 2022-03-12 ENCOUNTER — Inpatient Hospital Stay (HOSPITAL_COMMUNITY): Payer: Federal, State, Local not specified - Other

## 2022-03-12 DIAGNOSIS — K5900 Constipation, unspecified: Secondary | ICD-10-CM | POA: Diagnosis present

## 2022-03-12 MED ORDER — DOCUSATE SODIUM 100 MG PO CAPS
100.0000 mg | ORAL_CAPSULE | Freq: Two times a day (BID) | ORAL | Status: DC
Start: 2022-03-12 — End: 2022-03-20
  Administered 2022-03-12 – 2022-03-19 (×11): 100 mg via ORAL
  Filled 2022-03-12: qty 1
  Filled 2022-03-12: qty 28
  Filled 2022-03-12 (×3): qty 1
  Filled 2022-03-12 (×2): qty 28
  Filled 2022-03-12 (×4): qty 1
  Filled 2022-03-12 (×3): qty 28
  Filled 2022-03-12: qty 1
  Filled 2022-03-12: qty 28
  Filled 2022-03-12: qty 1
  Filled 2022-03-12: qty 28
  Filled 2022-03-12 (×5): qty 1
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
## 2022-03-12 NOTE — Progress Notes (Signed)
[facility] MD Progress Note  03/12/2022 3:48 PM Kristina Owens  MRN:  [id number]  History of Present Illness: The patient is a 56 years old Caucasian who presents to the hospital for suicidal ideation, increased Depression with Psychosis.  The patient states "look I have a lot of mental health issues, physical health problems, I'm homeless, I started hearing voices, I'm an alcoholic, my mind is not right and I wish someone will pump some overdose drugs through through IV and will leave this world. I need help." States she is okay if she sleeps and never wake up.  24 hour EMR reviewed. Case discussed in progression rounds.  PRNs: hydroxyzine for anxiety, robaxin for cramping, trazodone for sleep, nicorette gum for nicotine craving  Subjective:  Kristina Owens is seen this afternoon on rounds. She is still  having abdominal pain, L>R, and left leg pain today. She has been laying down a lot today. She has not had a BM today. She is still eating. Sleep has been broken. Suicidal ideations come and go. Auditory hallucinations are present but less frequent. We review the XR results and discuss that it is likely constipation that is causing abdominal pain. The leg pain is likely due to previous large blood clot that she has had in that leg in the past.   Principal Problem: Major depressive disorder, recurrent episode, severe, with psychosis (HCC) Diagnosis: Principal Problem:   Major depressive disorder, recurrent episode, severe, with psychosis (HCC) Active Problems:   GAD (generalized anxiety disorder)   Alcohol dependence (HCC)   Insomnia   Constipation  Total Time spent with patient: 30 minutes  Past Psychiatric History: MDD, PTSD, OCD (check on things repeatedly) & ADHD  Past Medical History:  Past Medical History:  Diagnosis Date   Anxiety    Depression    DVT (deep vein thrombosis) in pregnancy    GERD (gastroesophageal reflux disease)    Ovarian cyst     Past Surgical History:  Procedure  Laterality Date   APPENDECTOMY  age 70   IR PTA VENOUS EXCEPT DIALYSIS CIRCUIT  01/16/2021   IR RADIOLOGIST EVAL & MGMT  03/19/2021   IR THROMBECT VENO MECH MOD SED  01/16/2021   IR US GUIDE VASC ACCESS LEFT  01/16/2021   IR VENO/EXT/UNI LEFT  01/16/2021   IR VENOCAVAGRAM IVC  01/16/2021   TONSILLECTOMY Bilateral age 18   Family History:  Family History  Problem Relation Age of Onset   Asthma Mother    COPD Mother    Cancer Father        thyroid cancer   Family Psychiatric  History: denies Social History:  Social History   Substance and Sexual Activity  Alcohol Use Yes     Social History   Substance and Sexual Activity  Drug Use Yes   Types: "Crack" cocaine, Cocaine, Methamphetamines   Comment: last used 4/31/22    Social History   Socioeconomic History   Marital status: Divorced    Spouse name: Not on file   Number of children: Not on file   Years of education: Not on file   Highest education level: Not on file  Occupational History   Not on file  Tobacco Use   Smoking status: Every Day    Packs/day: 0.50    Years: 35.00    Total pack years: 17.50    Types: Cigarettes   Smokeless tobacco: Never  Vaping Use   Vaping Use: Never used  Substance and Sexual Activity   Alcohol use:  Yes   Drug use: Yes    Types: "Crack" cocaine, Cocaine, Methamphetamines    Comment: last used 4/31/22   Sexual activity: Not on file  Other Topics Concern   Not on file  Social History Narrative   Not on file   Social Determinants of Health   Financial Resource Strain: Not on file  Food Insecurity: Not on file  Transportation Needs: Not on file  Physical Activity: Not on file  Stress: Not on file  Social Connections: Not on file   Additional Social History: divorced. Currently homeless. Has court on Sept 1st.                         Sleep: Poor  Appetite:  Fair  Current Medications: Current Facility-Administered Medications  Medication Dose Route Frequency  Provider Last Rate Last Admin   acetaminophen (TYLENOL) tablet 650 mg  650 mg Oral Q6H PRN Welford Roche, Josephine C, NP       alum & mag hydroxide-simeth (MAALOX/MYLANTA) 200-200-20 MG/5ML suspension 30 mL  30 mL Oral Q4H PRN Welford Roche, Josephine C, NP       ARIPiprazole (ABILIFY) tablet 5 mg  5 mg Oral Daily Onuoha, Josephine C, NP   5 mg at 03/12/22 4235   docusate sodium (COLACE) capsule 100 mg  100 mg Oral BID Roselle Locus, MD       escitalopram (LEXAPRO) tablet 10 mg  10 mg Oral Daily Daneil Dan O, NP   10 mg at 03/12/22 3614   feeding supplement (ENSURE ENLIVE / ENSURE PLUS) liquid 237 mL  237 mL Oral BID BM Bobbitt, Shalon E, NP   237 mL at 03/12/22 0941   hydrOXYzine (ATARAX) tablet 25 mg  25 mg Oral TID PRN Dahlia Byes C, NP   25 mg at 03/11/22 2110   ibuprofen (ADVIL) tablet 600 mg  600 mg Oral Q6H PRN Roselle Locus, MD   600 mg at 03/11/22 1650   loperamide (IMODIUM) capsule 2-4 mg  2-4 mg Oral PRN Sonny Dandy, NP       LORazepam (ATIVAN) tablet 1 mg  1 mg Oral BID Daneil Dan O, NP   1 mg at 03/12/22 4315   Followed by   Melene Muller ON 03/13/2022] LORazepam (ATIVAN) tablet 1 mg  1 mg Oral Daily Oloyede, Juliet O, NP       magnesium hydroxide (MILK OF MAGNESIA) suspension 30 mL  30 mL Oral Daily PRN Dahlia Byes C, NP       methocarbamol (ROBAXIN) tablet 500 mg  500 mg Oral Q6H PRN Dahlia Byes C, NP   500 mg at 03/12/22 0940   multivitamin with minerals tablet 1 tablet  1 tablet Oral Daily Daneil Dan O, NP   1 tablet at 03/12/22 4008   nicotine (NICODERM CQ - dosed in mg/24 hours) patch 21 mg  21 mg Transdermal Daily Daneil Dan O, NP   21 mg at 03/12/22 0937   ondansetron (ZOFRAN-ODT) disintegrating tablet 4 mg  4 mg Oral Q6H PRN Sonny Dandy, NP       rivaroxaban (XARELTO) tablet 20 mg  20 mg Oral Q supper Dahlia Byes C, NP   20 mg at 03/11/22 1647   thiamine (VITAMIN B1) injection 100 mg  100 mg Intramuscular Once Daneil Dan  O, NP       thiamine (VITAMIN B1) tablet 100 mg  100 mg Oral Daily Oloyede, Juliet O, NP   100 mg  at 03/12/22 0937   traZODone (DESYREL) tablet 50 mg  50 mg Oral QHS PRN Dahlia Byes C, NP   50 mg at 03/11/22 2110    Lab Results:  Results for orders placed or performed during the hospital encounter of 03/10/22 (from the past 48 hour(s))  Magnesium     Status: None   Collection Time: 03/11/22  6:26 AM  Result Value Ref Range   Magnesium 1.9 1.7 - 2.4 mg/dL    Comment: Performed at [facility], [address], [place], [place] [address]  Hepatitis panel, acute     Status: None   Collection Time: 03/11/22  6:26 AM  Result Value Ref Range   Hepatitis B Surface Ag NON REACTIVE NON REACTIVE   HCV Ab NON REACTIVE NON REACTIVE    Comment: (NOTE) Nonreactive HCV antibody screen is consistent with no HCV infections,  unless recent infection is suspected or other evidence exists to indicate HCV infection.     Hep A IgM NON REACTIVE NON REACTIVE   Hep B C IgM NON REACTIVE NON REACTIVE    Comment: Performed at [facility], [address], [place], [place] [address]  VITAMIN D 25 Hydroxy (Vit-D Deficiency, Fractures)     Status: None   Collection Time: 03/11/22  6:26 AM  Result Value Ref Range   Vit D, 25-Hydroxy 31.00 30 - 100 ng/mL    Comment: (NOTE) Vitamin D deficiency has been defined by the Institute of Medicine  and an Endocrine Society practice guideline as a level of serum 25-OH  vitamin D less than 20 ng/mL (1,2). The Endocrine Society went on to  further define vitamin D insufficiency as a level between 21 and 29  ng/mL (2).  1. IOM (Institute of Medicine). 2010. Dietary reference intakes for  calcium and D. [place]: [employer]. 2. Holick MF, Binkley , Bischoff-Ferrari HA, et al. Evaluation,  treatment, and prevention of vitamin D deficiency: an Endocrine  Society clinical practice guideline, JCEM. 2011 Jul; 96(7):  1911-30.  Performed at [facility], [address], [place], [place] [address]   Vitamin B12     Status: None   Collection Time: 03/11/22  6:26 AM  Result Value Ref Range   Vitamin B-12 201 180 - 914 pg/mL    Comment: (NOTE) This assay is not validated for testing neonatal or myeloproliferative syndrome specimens for Vitamin B12 levels. Performed at [facility], [address], [place], [place] [address]   TSH     Status: None   Collection Time: 03/11/22  6:26 AM  Result Value Ref Range   TSH 3.487 0.350 - 4.500 uIU/mL    Comment: Performed by a 3rd Generation assay with a functional sensitivity of <=0.01 uIU/mL. Performed at [facility], [address] [place], [place] [address]   RPR     Status: None   Collection Time: 03/11/22  6:26 AM  Result Value Ref Range   RPR Ser Ql NON REACTIVE NON REACTIVE    Comment: Performed at [facility], [address] [place], [place] [address]    Blood Alcohol level:  Lab Results  Component Value Date   ETH 76 (H) 03/08/2022   ETH <10 01/18/2022    Metabolic Disorder Labs: No results found for: "HGBA1C", "MPG" No results found for: "PROLACTIN" No results found for: "CHOL", "TRIG", "HDL", "CHOLHDL", "VLDL", "LDLCALC"  Physical Findings: AIMS:  , ,  ,  ,  CIWA:  CIWA-Ar Total: 0 COWS:     Musculoskeletal: Strength & Muscle Tone: within normal limits Gait & Station: normal Patient leans: N/A  Psychiatric Specialty Exam:  Presentation  General Appearance: Appropriate for Environment  Eye Contact:Fair  Speech:Normal Rate  Speech Volume:Normal  Handedness:Right   Mood and Affect  Mood:Depressed; Anxious  Affect:Congruent   Thought Process  Thought Processes:Linear  Descriptions of Associations:Intact  Orientation:Full (Time, Place and Person)  Thought Content:Logical  History of Schizophrenia/Schizoaffective disorder:No  Duration of Psychotic  Symptoms:No data recorded Hallucinations:Hallucinations: Auditory  Ideas of Reference:None  Suicidal Thoughts:Suicidal Thoughts: Yes, Passive SI Passive Intent and/or Plan: Without Intent  Homicidal Thoughts:Homicidal Thoughts: No   Sensorium  Memory:Immediate Good; Recent Fair; Remote Fair  Judgment:Fair  Insight:Fair   Executive Functions  Concentration:Fair  Attention Span:Fair  Recall:Fair  Fund of Knowledge:Fair  Language:Fair   Psychomotor Activity  Psychomotor Activity:Psychomotor Activity: Normal   Assets  Assets:Communication Skills   Sleep  Sleep:Sleep: Poor    Physical Exam: Physical Exam Vitals and nursing note reviewed.  Constitutional:      Appearance: Normal appearance.  HENT:     Head: Normocephalic.     Nose: Nose normal.  Eyes:     Extraocular Movements: Extraocular movements intact.  Pulmonary:     Effort: Pulmonary effort is normal.  Abdominal:     General: Abdomen is flat.  Musculoskeletal:        General: Normal range of motion.     Cervical back: Normal range of motion.  Neurological:     General: No focal deficit present.     Mental Status: She is alert and oriented to person, place, and time.    Review of Systems  Constitutional:  Negative for fever.  HENT:  Negative for hearing loss.   Respiratory:  Negative for cough.   Cardiovascular:  Negative for chest pain and palpitations.  Gastrointestinal:  Positive for abdominal pain. Negative for constipation, diarrhea, nausea and vomiting.  Genitourinary:  Negative for dysuria and frequency.  Musculoskeletal:  Positive for back pain, joint pain and myalgias.  Skin:  Negative for rash.  Neurological:  Negative for dizziness and headaches.  Psychiatric/Behavioral:  Positive for depression, hallucinations and suicidal ideas. Negative for memory loss. The patient is nervous/anxious.    Blood pressure 98/69, pulse 78, temperature (!) 97.5 F (36.4 C), temperature source  Oral, resp. rate 16, height 5\' 3"  (1.6 m), weight 53.1 kg, SpO2 100 %. Body mass index is 20.73 kg/m.   Treatment Plan Summary: Daily contact with patient to assess and evaluate symptoms and progress in treatment and Medication management  Medications: Mood/anxiety: continue group therapy, milieu therapy, 1:1 evaluation with provider.  Abilify 5mg  PO daily antidepressant augment and mood stabilizer Lexapro 10mg  PO daily for depression and anxiety Hydroxyzine PRN anxiety Substance Abuse: brief intervention provided abstinence advised.  Continue ativan taper, thiamine replacement Rehab referral pending Abdominal/pelvic pain:  KUB and pelvic XR reviewed.  Colace BID for constipation Ibuprofen ordered PRN cramping Medical: PRNs for pain, constipation, indigestion available.  Continue xarelto for history DVT and saddle embolus Safety and Monitoring: voluntary admission to inpatient psychiatric unit for safety, stabilization and treatment Daily contact with patient to assess and evaluate symptoms and progress in treatment Patient's case to be discussed in multi-disciplinary team meeting Observation Level : q15 minute checks Vital signs: q12 hours Precautions: suicide, withdrawal    , MD 03/12/2022, 3:48 PM

## 2022-03-12 NOTE — Progress Notes (Signed)
   03/12/22 2245  Psych Admission Type (Psych Patients Only)  Admission Status Voluntary  Psychosocial Assessment  Patient Complaints Anxiety  Eye Contact Fair  Facial Expression Anxious  Affect Anxious;Depressed  Speech Soft  Interaction Assertive  Motor Activity Slow  Appearance/Hygiene Disheveled  Behavior Characteristics Cooperative  Mood Depressed;Anxious  Aggressive Behavior  Effect No apparent injury  Thought Process  Coherency WDL  Content WDL  Delusions WDL  Perception WDL  Hallucination None reported or observed  Judgment Impaired  Confusion None  Danger to Self  Current suicidal ideation? Passive  Self-Injurious Behavior Some self-injurious ideation observed or expressed.  No lethal plan expressed   Agreement Not to Harm Self Yes  Description of Agreement verbal contract for safety

## 2022-03-12 NOTE — Progress Notes (Signed)
Pt stated she was having pain issues with her sciatica, pt stated the Robaxin helped some last night

## 2022-03-12 NOTE — Group Note (Signed)
[facility] LCSW Group Therapy Note   Group Date: 03/12/2022 Start Time: 1300 End Time: 1400   Type of Therapy and Topic: Group Therapy: Avoiding Self-Sabotaging and Enabling Behaviors  Participation Level: Minimal   Description of Group:  In this group, patients will learn how to identify obstacles, self-sabotaging and enabling behaviors, as well as: what are they, why do we do them and what needs these behaviors meet. Discuss unhealthy relationships and how to have positive healthy boundaries with those that sabotage and enable. Explore aspects of self-sabotage and enabling in yourself and how to limit these self-destructive behaviors in everyday life.   Therapeutic Goals: 1. Patient will identify one obstacle that relates to self-sabotage and enabling behaviors 2. Patient will identify one personal self-sabotaging or enabling behavior they did prior to admission 3. Patient will state a plan to change the above identified behavior 4. Patient will demonstrate ability to communicate their needs through discussion and/or role play.    Summary of Patient Progress: Patient was present for part of group session. Patient participated in opening and closing remarks. However, patient did not contribute at all to the topic of discussion despite encouraged participation.    Therapeutic Modalities:  Cognitive Behavioral Therapy Person-Centered Therapy Motivational Interviewing    Corky Crafts, [place]

## 2022-03-12 NOTE — [facility] Group Notes (Signed)
Patient did not attend group.

## 2022-03-12 NOTE — Progress Notes (Signed)
Pt did not attend NA Group.

## 2022-03-12 NOTE — Progress Notes (Signed)
Pt endorses passive SI with no plan.  Pt on ativan taper. Pt appears to be having no signs or symptoms of etoh withdrawal.  Pt is pleasant and cooperative with staff.  Pt went to [facility] xray today for evaluation of abdomen and pelvis pain.  Pt is in no acute distress at this time.  VSS.  RN will continue to monitor pt's progress and provide support as needed.

## 2022-03-13 LAB — LIPID PANEL
Cholesterol: 194 mg/dL (ref 0–200)
HDL: 54 mg/dL (ref >40)
LDL Cholesterol: 129 mg/dL — ABNORMAL HIGH (ref 0–99)
Total CHOL/HDL Ratio: 3.6 RATIO
Triglycerides: 53 mg/dL (ref <150)
VLDL: 11 mg/dL (ref 0–40)

## 2022-03-13 LAB — HEMOGLOBIN A1C
Hgb A1c MFr Bld: 5.1 % (ref 4.8–5.6)
Mean Plasma Glucose: 99.67 mg/dL

## 2022-03-13 MED ORDER — ARIPIPRAZOLE 10 MG PO TABS
10.0000 mg | ORAL_TABLET | Freq: Every day | ORAL | Status: DC
  Administered 2022-03-14 – 2022-03-17 (×4): 10 mg via ORAL
  Filled 2022-03-13 (×5): qty 1
  Filled 2022-03-13: qty 14
  Filled 2022-03-13: qty 1

## 2022-03-13 MED ORDER — GABAPENTIN 100 MG PO CAPS
100.0000 mg | ORAL_CAPSULE | Freq: Three times a day (TID) | ORAL | Status: DC
  Filled 2022-03-13 (×2): qty 1

## 2022-03-13 NOTE — Progress Notes (Addendum)
[facility] MD Progress Note  03/13/2022 8:48 AM Kristina Owens  MRN:  [id number]  History of Present Illness: The patient is a 56 years old Caucasian who presents to the hospital for suicidal ideation, increased Depression with Psychosis.  The patient states "look I have a lot of mental health issues, physical health problems, I'm homeless, I started hearing voices, I'm an alcoholic, my mind is not right and I wish someone will pump some overdose drugs through through IV and will leave this world. I need help." States she is okay if she sleeps and never wake up.  24 hour EMR reviewed. Case discussed in progression rounds.  PRNs: hydroxyzine for anxiety, robaxin for cramping, trazodone for sleep, nicorette gum for nicotine craving  Subjective:  Kristina Owens is seen this afternoon on rounds. She did not sleep very well overnight. She has been lying down often due to the pain in her hip, pelvis and leg. She worries that it is swollen. She allows me to see it and there is no defect, swelling or discoloration noted. She had a small bowel movement today. She is still eating. No change to auditory hallucinations. She continues to have thoughts of death and dying. She is hopeful that she can find a rehab for substance abuse.    Principal Problem: Major depressive disorder, recurrent episode, severe, with psychosis (HCC) Diagnosis: Principal Problem:   Major depressive disorder, recurrent episode, severe, with psychosis (HCC) Active Problems:   GAD (generalized anxiety disorder)   Alcohol dependence (HCC)   Insomnia   Constipation  Total Time spent with patient: 30 minutes  Past Psychiatric History: MDD, PTSD, OCD (check on things repeatedly) & ADHD  Past Medical History:  Past Medical History:  Diagnosis Date  . Anxiety   . Depression   . DVT (deep vein thrombosis) in pregnancy   . GERD (gastroesophageal reflux disease)   . Ovarian cyst     Past Surgical History:  Procedure Laterality Date  .  APPENDECTOMY  age 68  . IR PTA VENOUS EXCEPT DIALYSIS CIRCUIT  01/16/2021  . IR RADIOLOGIST EVAL & MGMT  03/19/2021  . IR THROMBECT VENO MECH MOD SED  01/16/2021  . IR US GUIDE VASC ACCESS LEFT  01/16/2021  . IR VENO/EXT/UNI LEFT  01/16/2021  . IR VENOCAVAGRAM IVC  01/16/2021  . TONSILLECTOMY Bilateral age 43   Family History:  Family History  Problem Relation Age of Onset  . Asthma Mother   . COPD Mother   . Cancer Father        thyroid cancer   Family Psychiatric  History: denies Social History:  Social History   Substance and Sexual Activity  Alcohol Use Yes     Social History   Substance and Sexual Activity  Drug Use Yes  . Types: "Crack" cocaine, Cocaine, Methamphetamines   Comment: last used 4/31/22    Social History   Socioeconomic History  . Marital status: Divorced    Spouse name: Not on file  . Number of children: Not on file  . Years of education: Not on file  . Highest education level: Not on file  Occupational History  . Not on file  Tobacco Use  . Smoking status: Every Day    Packs/day: 0.50    Years: 35.00    Total pack years: 17.50    Types: Cigarettes  . Smokeless tobacco: Never  Vaping Use  . Vaping Use: Never used  Substance and Sexual Activity  . Alcohol use: Yes  . Drug  use: Yes    Types: "Crack" cocaine, Cocaine, Methamphetamines    Comment: last used 4/31/22  . Sexual activity: Not on file  Other Topics Concern  . Not on file  Social History Narrative  . Not on file   Social Determinants of Health   Financial Resource Strain: Not on file  Food Insecurity: Not on file  Transportation Needs: Not on file  Physical Activity: Not on file  Stress: Not on file  Social Connections: Not on file   Additional Social History: divorced. Currently homeless. Has court on Sept 1st.                         Sleep: Poor  Appetite:  Fair  Current Medications: Current Facility-Administered Medications  Medication Dose Route  Frequency Provider Last Rate Last Admin  . acetaminophen (TYLENOL) tablet 650 mg  650 mg Oral Q6H PRN Dahlia Byes C, NP      . alum & mag hydroxide-simeth (MAALOX/MYLANTA) 200-200-20 MG/5ML suspension 30 mL  30 mL Oral Q4H PRN Earney Navy, NP      . [START ON 03/14/2022] ARIPiprazole (ABILIFY) tablet 10 mg  10 mg Oral Daily Lilana Blasko, Shelbie Hutching, MD      . docusate sodium (COLACE) capsule 100 mg  100 mg Oral BID Roselle Locus, MD   100 mg at 03/13/22 0742  . escitalopram (LEXAPRO) tablet 10 mg  10 mg Oral Daily Daneil Dan O, NP   10 mg at 03/13/22 0738  . feeding supplement (ENSURE ENLIVE / ENSURE PLUS) liquid 237 mL  237 mL Oral BID BM Bobbitt, Shalon E, NP   237 mL at 03/12/22 1700  . hydrOXYzine (ATARAX) tablet 25 mg  25 mg Oral TID PRN Dahlia Byes C, NP   25 mg at 03/12/22 2119  . ibuprofen (ADVIL) tablet 600 mg  600 mg Oral Q6H PRN Roselle Locus, MD   600 mg at 03/12/22 1702  . loperamide (IMODIUM) capsule 2-4 mg  2-4 mg Oral PRN Daneil Dan O, NP      . magnesium hydroxide (MILK OF MAGNESIA) suspension 30 mL  30 mL Oral Daily PRN Welford Roche, Josephine C, NP      . methocarbamol (ROBAXIN) tablet 500 mg  500 mg Oral Q6H PRN Dahlia Byes C, NP   500 mg at 03/13/22 0743  . multivitamin with minerals tablet 1 tablet  1 tablet Oral Daily Sonny Dandy, NP   1 tablet at 03/13/22 0738  . nicotine (NICODERM CQ - dosed in mg/24 hours) patch 21 mg  21 mg Transdermal Daily Daneil Dan O, NP   21 mg at 03/13/22 0740  . ondansetron (ZOFRAN-ODT) disintegrating tablet 4 mg  4 mg Oral Q6H PRN Daneil Dan O, NP      . rivaroxaban (XARELTO) tablet 20 mg  20 mg Oral Q supper Dahlia Byes C, NP   20 mg at 03/12/22 1701  . thiamine (VITAMIN B1) tablet 100 mg  100 mg Oral Daily Daneil Dan O, NP   100 mg at 03/13/22 0738  . traZODone (DESYREL) tablet 50 mg  50 mg Oral QHS PRN Dahlia Byes C, NP   50 mg at 03/12/22 2119    Lab Results:   Results for orders placed or performed during the hospital encounter of 03/10/22 (from the past 48 hour(s))  Lipid panel     Status: Abnormal   Collection Time: 03/13/22  6:22 AM  Result Value Ref Range  Cholesterol 194 0 - 200 mg/dL   Triglycerides 53 <361 mg/dL   HDL 54 >44 mg/dL   Total CHOL/HDL Ratio 3.6 RATIO   VLDL 11 0 - 40 mg/dL   LDL Cholesterol 315 (H) 0 - 99 mg/dL    Comment:        Total Cholesterol/HDL:CHD Risk Coronary Heart Disease Risk Table                     Men   Women  1/2 Average Risk   3.4   3.3  Average Risk       5.0   4.4  2 X Average Risk   9.6   7.1  3 X Average Risk  23.4   11.0        Use the calculated Patient Ratio above and the CHD Risk Table to determine the patient's CHD Risk.        ATP III CLASSIFICATION (LDL):  <100     mg/dL   Optimal  400-867  mg/dL   Near or Above                    Optimal  130-159  mg/dL   Borderline  619-509  mg/dL   High  >326     mg/dL   Very High Performed at [facility], [address] [place], [place] [address]     Blood Alcohol level:  Lab Results  Component Value Date   ETH 76 (H) 03/08/2022   ETH <10 01/18/2022    Metabolic Disorder Labs: No results found for: "HGBA1C", "MPG" No results found for: "PROLACTIN" Lab Results  Component Value Date   CHOL 194 03/13/2022   TRIG 53 03/13/2022   HDL 54 03/13/2022   CHOLHDL 3.6 03/13/2022   VLDL 11 03/13/2022   LDLCALC 129 (H) 03/13/2022    Physical Findings: AIMS:  , ,  ,  ,    CIWA:  CIWA-Ar Total: 3 COWS:     Musculoskeletal: Strength & Muscle Tone: within normal limits Gait & Station: normal Patient leans: N/A  Psychiatric Specialty Exam:  Presentation  General Appearance: Appropriate for Environment  Eye Contact:Fair  Speech:Normal Rate  Speech Volume:Normal  Handedness:Right   Mood and Affect  Mood:Depressed; Anxious  Affect:Congruent   Thought Process  Thought Processes:Linear  Descriptions  of Associations:Intact  Orientation:Full (Time, Place and Person)  Thought Content:Logical  History of Schizophrenia/Schizoaffective disorder:No  Duration of Psychotic Symptoms:No data recorded Hallucinations:No data recorded  Ideas of Reference:None  Suicidal Thoughts:No data recorded  Homicidal Thoughts:No data recorded   Sensorium  Memory:Immediate Good; Recent Fair; Remote Fair  Judgment:Fair  Insight:Fair   Executive Functions  Concentration:Fair  Attention Span:Fair  Recall:Fair  Fund of Knowledge:Fair  Language:Fair   Psychomotor Activity  Psychomotor Activity:No data recorded   Assets  Assets:Communication Skills   Sleep  Sleep:No data recorded    Physical Exam: Physical Exam Vitals and nursing note reviewed.  Constitutional:      Appearance: Normal appearance.  HENT:     Head: Normocephalic.     Nose: Nose normal.  Eyes:     Extraocular Movements: Extraocular movements intact.  Pulmonary:     Effort: Pulmonary effort is normal.  Abdominal:     General: Abdomen is flat.  Musculoskeletal:        General: Normal range of motion.     Cervical back: Normal range of motion.  Neurological:     General: No focal  deficit present.     Mental Status: She is alert and oriented to person, place, and time.   Review of Systems  Constitutional:  Negative for fever.  HENT:  Negative for hearing loss.   Respiratory:  Negative for cough.   Cardiovascular:  Negative for chest pain and palpitations.  Gastrointestinal:  Positive for abdominal pain. Negative for constipation, diarrhea, nausea and vomiting.  Genitourinary:  Negative for dysuria and frequency.  Musculoskeletal:  Positive for back pain, joint pain and myalgias.  Skin:  Negative for rash.  Neurological:  Negative for dizziness and headaches.  Psychiatric/Behavioral:  Positive for depression, hallucinations and suicidal ideas. Negative for memory loss. The patient is nervous/anxious.     Blood pressure 101/71, pulse 81, temperature 98.6 F (37 C), temperature source Oral, resp. rate 16, height 5\' 3"  (1.6 m), weight 53.1 kg, SpO2 100 %. Body mass index is 20.73 kg/m.   Treatment Plan Summary: Daily contact with patient to assess and evaluate symptoms and progress in treatment and Medication management  Medications: Mood/anxiety: continue group therapy, milieu therapy, 1:1 evaluation with provider.  increase Abilify 10 mg PO daily antidepressant augment and mood stabilizer Lexapro 10mg  PO daily for depression and anxiety Hydroxyzine PRN anxiety Substance Abuse: brief intervention provided abstinence advised.  Continue ativan taper, thiamine replacement Rehab referral pending Abdominal/pelvic pain:  KUB and pelvic XR reviewed.  Colace BID for constipation Ibuprofen ordered PRN cramping Gabapentin/lyrica not allowed at most rehab Medical: PRNs for pain, constipation, indigestion available.  Continue xarelto for history DVT and saddle embolus Safety and Monitoring: voluntary admission to inpatient psychiatric unit for safety, stabilization and treatment Daily contact with patient to assess and evaluate symptoms and progress in treatment Patient's case to be discussed in multi-disciplinary team meeting Observation Level : q15 minute checks Vital signs: q12 hours Precautions: suicide, withdrawal   6. Dispo: coordinating with SW for substance abuse placement. Patient has been tentatively accepted to [facility] for Tuesday.   [facility], MD 03/13/2022, 8:48 AM

## 2022-03-13 NOTE — Progress Notes (Signed)
   03/13/22 2300  Psych Admission Type (Psych Patients Only)  Admission Status Voluntary  Psychosocial Assessment  Patient Complaints Anxiety  Eye Contact Fair  Facial Expression Anxious  Affect Anxious;Depressed  Speech Soft  Interaction Assertive  Motor Activity Slow  Appearance/Hygiene Disheveled  Behavior Characteristics Cooperative;Anxious  Mood Depressed;Anxious  Aggressive Behavior  Effect No apparent injury  Thought Process  Coherency WDL  Content WDL  Delusions WDL  Perception WDL  Hallucination None reported or observed  Judgment Impaired  Confusion None  Danger to Self  Current suicidal ideation? Passive  Self-Injurious Behavior Some self-injurious ideation observed or expressed.  No lethal plan expressed   Agreement Not to Harm Self Yes  Description of Agreement verbal contract for safety

## 2022-03-13 NOTE — [facility] Group Notes (Signed)
Adult Psychoeducational Group Note  Date:  03/13/2022 Time:  9:42 AM  Group Topic/Focus:  Goals Group:   The focus of this group is to help patients establish daily goals to achieve during treatment and discuss how the patient can incorporate goal setting into their daily lives to aide in recovery.  Participation Level:  Did Not Attend   Kristina Owens 03/13/2022, 9:42 AM

## 2022-03-13 NOTE — Plan of Care (Signed)
Nurse discussed anxiety, depression and coping skills with patient.  

## 2022-03-13 NOTE — [facility] Group Notes (Signed)
The focus of this group is to help patients review their daily goal of treatment and discuss progress on daily workbooks. Pt was attentive and appropriate during tonight's wrap up group. Pt was able to share that she is working with providers with obtain long term in-patient treatment. Pt stated that she need long term help. Pt stated overall good day.

## 2022-03-14 ENCOUNTER — Encounter (HOSPITAL_COMMUNITY): Payer: Self-pay | Admitting: Nurse Practitioner

## 2022-03-14 ENCOUNTER — Inpatient Hospital Stay (HOSPITAL_COMMUNITY): Payer: Medicaid Other

## 2022-03-14 LAB — CBC WITH DIFFERENTIAL/PLATELET
Abs Immature Granulocytes: 0.04 10*3/uL (ref 0.00–0.07)
Basophils Absolute: 0 10*3/uL (ref 0.0–0.1)
Basophils Relative: 0 %
Eosinophils Absolute: 0.2 10*3/uL (ref 0.0–0.5)
Eosinophils Relative: 2 %
HCT: 39.9 % (ref 36.0–46.0)
Hemoglobin: 13.2 g/dL (ref 12.0–15.0)
Immature Granulocytes: 1 %
Lymphocytes Relative: 36 %
Lymphs Abs: 3.1 10*3/uL (ref 0.7–4.0)
MCH: 30.2 pg (ref 26.0–34.0)
MCHC: 33.1 g/dL (ref 30.0–36.0)
MCV: 91.3 fL (ref 80.0–100.0)
Monocytes Absolute: 0.8 10*3/uL (ref 0.1–1.0)
Monocytes Relative: 9 %
Neutro Abs: 4.7 10*3/uL (ref 1.7–7.7)
Neutrophils Relative %: 52 %
Platelets: 230 10*3/uL (ref 150–400)
RBC: 4.37 MIL/uL (ref 3.87–5.11)
RDW: 12.4 % (ref 11.5–15.5)
WBC: 8.8 10*3/uL (ref 4.0–10.5)
nRBC: 0 % (ref 0.0–0.2)

## 2022-03-14 LAB — URINALYSIS, ROUTINE W REFLEX MICROSCOPIC
Bilirubin Urine: NEGATIVE
Glucose, UA: NEGATIVE mg/dL
Hgb urine dipstick: NEGATIVE
Ketones, ur: NEGATIVE mg/dL
Nitrite: NEGATIVE
Protein, ur: NEGATIVE mg/dL
Specific Gravity, Urine: 1.012 (ref 1.005–1.030)
pH: 6 (ref 5.0–8.0)

## 2022-03-14 LAB — COMPREHENSIVE METABOLIC PANEL
ALT: 15 U/L (ref 0–44)
AST: 14 U/L — ABNORMAL LOW (ref 15–41)
Albumin: 3.6 g/dL (ref 3.5–5.0)
Alkaline Phosphatase: 71 U/L (ref 38–126)
Anion gap: 5 (ref 5–15)
BUN: 17 mg/dL (ref 6–20)
CO2: 26 mmol/L (ref 22–32)
Calcium: 9.1 mg/dL (ref 8.9–10.3)
Chloride: 107 mmol/L (ref 98–111)
Creatinine, Ser: 0.65 mg/dL (ref 0.44–1.00)
GFR, Estimated: >60 mL/min (ref >60)
Glucose, Bld: 89 mg/dL (ref 70–99)
Potassium: 4.3 mmol/L (ref 3.5–5.1)
Sodium: 138 mmol/L (ref 135–145)
Total Bilirubin: 0.3 mg/dL (ref 0.3–1.2)
Total Protein: 6.5 g/dL (ref 6.5–8.1)

## 2022-03-14 LAB — PREGNANCY, URINE: Preg Test, Ur: NEGATIVE

## 2022-03-14 LAB — LIPASE, BLOOD: Lipase: 32 U/L (ref 11–51)

## 2022-03-14 MED ORDER — OXYCODONE-ACETAMINOPHEN 5-325 MG PO TABS
1.0000 | ORAL_TABLET | Freq: Once | ORAL | Status: AC
  Administered 2022-03-14: 1 via ORAL
  Filled 2022-03-14: qty 1

## 2022-03-14 MED ORDER — TRAMADOL HCL 50 MG PO TABS
50.0000 mg | ORAL_TABLET | Freq: Once | ORAL | Status: AC
  Administered 2022-03-14: 50 mg via ORAL
  Filled 2022-03-14: qty 1

## 2022-03-14 MED ORDER — ONDANSETRON HCL 4 MG PO TABS
4.0000 mg | ORAL_TABLET | Freq: Four times a day (QID) | ORAL | 0 refills | Status: DC
Start: 2022-03-14 — End: 2022-03-19

## 2022-03-14 MED ORDER — DOCUSATE SODIUM 100 MG PO CAPS
100.0000 mg | ORAL_CAPSULE | Freq: Two times a day (BID) | ORAL | 0 refills | Status: DC
Start: 2022-03-14 — End: 2022-03-19

## 2022-03-14 MED ORDER — CEPHALEXIN 500 MG PO CAPS: 500.0000 mg | ORAL_CAPSULE | Freq: Four times a day (QID) | ORAL | 0 refills | Status: DC

## 2022-03-14 NOTE — Progress Notes (Signed)
Pt returned from ED and is now at [facility]. Pt remains safe on Q15 min checks and contracts for safety.

## 2022-03-14 NOTE — ED Triage Notes (Signed)
Pt brought in from [facility] with a Recruitment consultant. Pt c/o left side flank pain/abdominal pain, hx of UTI and DVT

## 2022-03-14 NOTE — Discharge Instructions (Signed)
You were seen in the emergency department today for left-sided flank pain.  It does appear that you have likely a UTI.  I am prescribing you with antibiotics.  Additionally I am giving you some Colace which is a stool softener for you to take to help with constipation which may be contributing to your symptoms and I prescribed you Zofran for the nausea that you have been having.  Please return for any abdominal pain with fevers or inability to walk because of the pain.

## 2022-03-14 NOTE — ED Notes (Signed)
Care handoff- called report to [facility]

## 2022-03-14 NOTE — ED Notes (Signed)
Provided pt with meal tray. Safety sitter remains at bedside.

## 2022-03-14 NOTE — ED Provider Notes (Signed)
[facility]-EMERGENCY DEPT Provider Note   CSN: [id number] Arrival date & time: 03/14/22  1113     History  Chief Complaint  Patient presents with   Flank Pain    Kristina Owens is a 56 y.o. female. With past medical history of anxiety and depression, polysubstance use including alcohol, GERD, DVT/PE who presents to the emergency department with flank pain from [facility].  States she is having left hip and flank pain. States this problem has been ongoing intermittently over the past few months, but significantly worse over the past 1 week.  Describes it as constant and sharp/stabbing pain. It occasionally radiates into her groin and left thigh. Has had associated nausea without vomiting. She denies trauma or falls. Denies urinary symptoms, vaginal discharge or previous surgeries.    Flank Pain Pertinent negatives include no chest pain and no shortness of breath.       Home Medications Prior to Admission medications   Medication Sig Start Date End Date Taking? Authorizing Provider  doxycycline (VIBRAMYCIN) 100 MG capsule Take 1 capsule (100 mg total) by mouth 2 (two) times daily. Patient not taking: Reported on 01/18/2022 01/06/22   Dione Booze, MD  ibuprofen (ADVIL) 200 MG tablet Take 1,000-1,200 mg by mouth 2 (two) times daily as needed (pain). Patient not taking: Reported on 03/08/2022    [provider]  methocarbamol (ROBAXIN) 500 MG tablet Take 1 tablet (500 mg total) by mouth every 8 (eight) hours as needed for muscle spasms. Patient not taking: Reported on 03/08/2022 03/07/22   Sabas Sous, MD  metroNIDAZOLE (FLAGYL) 500 MG tablet Take 1 tablet (500 mg total) by mouth 2 (two) times daily. Patient not taking: Reported on 01/18/2022 01/06/22   Dione Booze, MD  ondansetron (ZOFRAN-ODT) 4 MG disintegrating tablet Take 1 tablet (4 mg total) by mouth every 8 (eight) hours as needed for nausea or vomiting. Patient not taking: Reported on 03/08/2022 03/07/22    Sabas Sous, MD  RIVAROXABAN Carlena Hurl) VTE STARTER PACK (15 & 20 MG) Follow package directions: Take one 15mg  tablet by mouth twice a day. On day 22, switch to one 20mg  tablet once a day. Take with food. Patient not taking: Reported on 03/08/2022 03/07/22   05/08/2022, MD      Allergies    Patient has no known allergies.    Review of Systems   Review of Systems  Constitutional:  Negative for fever.  Respiratory:  Negative for shortness of breath.   Cardiovascular:  Negative for chest pain.  Gastrointestinal:  Positive for nausea. Negative for constipation, diarrhea and vomiting.  Genitourinary:  Positive for flank pain. Negative for vaginal discharge.  Musculoskeletal:  Positive for arthralgias and back pain.  All other systems reviewed and are negative.   Physical Exam Updated Vital Signs BP 97/66 (BP Location: Right Arm)   Pulse 64   Temp 98.2 F (36.8 C) (Oral)   Resp 18   Ht 5\' 3"  (1.6 m)   Wt 53.1 kg   SpO2 97%   BMI 20.73 kg/m  Physical Exam Vitals and nursing note reviewed.  Constitutional:      Appearance: Normal appearance. She is normal weight.  HENT:     Head: Normocephalic.     Mouth/Throat:     Mouth: Mucous membranes are moist.     Pharynx: Oropharynx is clear.  Eyes:     General: No scleral icterus.    Extraocular Movements: Extraocular movements intact.     Pupils:  Pupils are equal, round, and reactive to light.  Cardiovascular:     Rate and Rhythm: Normal rate and regular rhythm.     Pulses: Normal pulses.     Heart sounds: No murmur heard. Pulmonary:     Effort: Pulmonary effort is normal. No respiratory distress.     Breath sounds: Normal breath sounds.  Abdominal:     General: Abdomen is flat. Bowel sounds are normal. There is no distension.     Palpations: Abdomen is soft.     Tenderness: There is abdominal tenderness in the left lower quadrant. There is left CVA tenderness. There is no guarding or rebound.  Musculoskeletal:         General: Tenderness present. No deformity or signs of injury.       Arms:     Cervical back: Neck supple.     Left hip: Tenderness and bony tenderness present.  Skin:    General: Skin is warm and dry.     Capillary Refill: Capillary refill takes less than 2 seconds.     Findings: No bruising or rash.  Neurological:     General: No focal deficit present.     Mental Status: She is alert and oriented to person, place, and time. Mental status is at baseline.  Psychiatric:        Mood and Affect: Mood normal.        Behavior: Behavior normal.        Thought Content: Thought content normal.        Judgment: Judgment normal.     ED Results / Procedures / Treatments   Labs (all labs ordered are listed, but only abnormal results are displayed) Labs Reviewed  LIPID PANEL - Abnormal; Notable for the following components:      Result Value   LDL Cholesterol 129 (*)    All other components within normal limits  COMPREHENSIVE METABOLIC PANEL - Abnormal; Notable for the following components:   AST 14 (*)    All other components within normal limits  URINALYSIS, ROUTINE W REFLEX MICROSCOPIC - Abnormal; Notable for the following components:   APPearance HAZY (*)    Leukocytes,Ua LARGE (*)    Bacteria, UA RARE (*)    All other components within normal limits  MAGNESIUM  HEPATITIS PANEL, ACUTE  VITAMIN D 25 HYDROXY (VIT D DEFICIENCY, FRACTURES)  VITAMIN B12  TSH  RPR  HEMOGLOBIN A1C  LIPASE, BLOOD  CBC WITH DIFFERENTIAL/PLATELET  PREGNANCY, URINE   EKG None  Radiology CT Renal Stone Study  Result Date: 03/14/2022 CLINICAL DATA:  Flank pain, kidney stone suspected EXAM: CT ABDOMEN AND PELVIS WITHOUT CONTRAST TECHNIQUE: Multidetector CT imaging of the abdomen and pelvis was performed following the standard protocol without IV contrast. RADIATION DOSE REDUCTION: This exam was performed according to the departmental dose-optimization program which includes automated exposure control,  adjustment of the mA and/or kV according to patient size and/or use of iterative reconstruction technique. COMPARISON:  January 06, 2022 FINDINGS: Lower chest: Minimal bibasilar atelectasis. Hepatobiliary: No focal liver abnormality is seen. No gallstones, gallbladder wall thickening, or biliary dilatation. Pancreas: Unremarkable. No pancreatic ductal dilatation or surrounding inflammatory changes. Spleen: Normal in size without focal abnormality. Adrenals/Urinary Tract: Adrenal glands are unremarkable. Kidneys are normal, without renal calculi, focal lesion, or hydronephrosis. Bladder is unremarkable. Stomach/Bowel: Stomach is within normal limits. Appendix is surgically absent. No evidence of bowel wall thickening, distention, or inflammatory changes. Moderately large stool burden throughout the colon including the rectosigmoid and  has increased in the interim. Vascular/Lymphatic: No significant vascular findings are present. No enlarged abdominal or pelvic lymph nodes. Reproductive: Uterus and bilateral adnexa are unremarkable. Other: No abdominal wall hernia or abnormality. No abdominopelvic ascites. Musculoskeletal: Mild thoracolumbar spondylosis. Minimal retrolisthesis at L1-L2. IMPRESSION: 1. Moderately large stool burden throughout the colon including the rectosigmoid of constipation. No bowel obstruction. 2. Kidneys have a normal appearance without nephroureterolithiasis or hydroureteronephrosis. Bladder is unremarkable. Electronically Signed   By: Marjo Bicker M.D.   On: 03/14/2022 15:00    Procedures Procedures   Medications Ordered in ED Medications  methocarbamol (ROBAXIN) tablet 500 mg (500 mg Oral Given 03/14/22 0641)  rivaroxaban (XARELTO) tablet 20 mg (20 mg Oral Given 03/13/22 1645)  acetaminophen (TYLENOL) tablet 650 mg (650 mg Oral Given 03/13/22 1647)  alum & mag hydroxide-simeth (MAALOX/MYLANTA) 200-200-20 MG/5ML suspension 30 mL (has no administration in time range)  magnesium hydroxide  (MILK OF MAGNESIA) suspension 30 mL (has no administration in time range)  hydrOXYzine (ATARAX) tablet 25 mg (25 mg Oral Given 03/13/22 2121)  traZODone (DESYREL) tablet 50 mg (50 mg Oral Given 03/13/22 2121)  feeding supplement (ENSURE ENLIVE / ENSURE PLUS) liquid 237 mL (237 mLs Oral Given 03/14/22 0952)  escitalopram (LEXAPRO) tablet 10 mg (10 mg Oral Given 03/14/22 0747)  thiamine (VITAMIN B1) tablet 100 mg (100 mg Oral Given 03/14/22 0747)  multivitamin with minerals tablet 1 tablet (1 tablet Oral Given 03/14/22 0747)  loperamide (IMODIUM) capsule 2-4 mg (has no administration in time range)  ondansetron (ZOFRAN-ODT) disintegrating tablet 4 mg (has no administration in time range)  nicotine (NICODERM CQ - dosed in mg/24 hours) patch 21 mg (21 mg Transdermal Patch Applied 03/14/22 0751)  ibuprofen (ADVIL) tablet 600 mg (600 mg Oral Given 03/13/22 2121)  docusate sodium (COLACE) capsule 100 mg (100 mg Oral Patient Refused/Not Given 03/14/22 0751)  ARIPiprazole (ABILIFY) tablet 10 mg (10 mg Oral Given 03/14/22 0747)  traMADol (ULTRAM) tablet 50 mg (has no administration in time range)  LORazepam (ATIVAN) tablet 1 mg (1 mg Oral Given 03/10/22 2127)    Followed by  LORazepam (ATIVAN) tablet 1 mg (1 mg Oral Given 03/11/22 1650)    Followed by  LORazepam (ATIVAN) tablet 1 mg (1 mg Oral Given 03/12/22 1702)    Followed by  LORazepam (ATIVAN) tablet 1 mg (1 mg Oral Given 03/13/22 7893)    ED Course/ Medical Decision Making/ A&P                           Medical Decision Making Amount and/or Complexity of Data Reviewed Labs: ordered. Radiology: ordered.  Risk Prescription drug management.  This patient presents to the ED with chief complaint(s) of left side back/flank pain with pertinent past medical history of substance abuse, GERD, DVT, PE which further complicates the presenting complaint. The complaint involves an extensive differential diagnosis and also carries with it a high risk of complications  and morbidity.    The differential diagnosis includes MSK, UTI, pyelonephritis, stone, obstructed stone, infected stone, diverticulitis, colitis, referred pain, ovarian torsion, PID or TOA, ovarian cyst, etc.    Additional history obtained: Additional history obtained from  none available  Records reviewed Care Everywhere/External Records  ED Course and Reassessment: 56 year old female who presents to the emergency department with left-sided flank, lower left abdominal pain that occasionally radiates into her groin or leg.  Appears to be a chronic issue that has been exacerbated. Obtained basic labs. UA  does appear to be consistent with UTI.  Obtained CT a/p without/renal stone study which does not nephrolithiasis or hydro. No other pertient findings.   Symptoms are inconsistent with pathologies such as acute hepatobiliary disease, acute pancreatitis (lipase negative), acute appendicitis, colitis, diverticulitis, ovarian torsion, PID, TOA. No evidence of stone. No evidence of pyelonephritis. Bones appear intact. She had x-ray of the hip 2 days ago that was negative. No findings on CT. No lower extremity swelling concerning for a DVT with referred pain into the hip. No rashes or swelling over the joint.   Will give her antibiotics for the dirty UA. Encouraged to follow-up with PCP. Also has moderate stool burden will prescribe abx, colace, zofran for use PRN. She is agreeable to this plan.   Independent labs interpretation:  The following labs were independently interpreted: CMP negative, CBC negative, lipase negative, urine with large leuks, rare bacteria 21-50 WBC  Independent visualization of imaging: - I independently visualized the following imaging with scope of interpretation limited to determining acute life threatening conditions related to emergency care: CT stone study, which revealed moderate to large stool burden, otherwise negative for acute findings   Consultation: - Consulted  or discussed management/test interpretation w/ external professional: not indicated  Consideration for admission or further workup: not indicated  Social Determinants of health: substance abuse, psychiatric history currently receiving treatment at [facility] Final Clinical Impression(s) / ED Diagnoses Final diagnoses:  Flank pain    Rx / DC Orders ED Discharge Orders          Ordered    cephALEXin (KEFLEX) 500 MG capsule  4 times daily        03/14/22 1514    ondansetron (ZOFRAN) 4 MG tablet  Every 6 hours        03/14/22 1514    docusate sodium (COLACE) 100 MG capsule  Every 12 hours        03/14/22 1514              Cristopher Peru, PA-C 03/14/22 1515    Arby Barrette, MD 03/21/22 1544

## 2022-03-14 NOTE — Progress Notes (Signed)
   03/14/22 2045  Psych Admission Type (Psych Patients Only)  Admission Status Voluntary  Psychosocial Assessment  Patient Complaints Anxiety  Eye Contact Fair  Facial Expression Anxious  Affect Anxious;Depressed  Speech Soft  Interaction Assertive  Motor Activity Slow  Appearance/Hygiene Disheveled  Behavior Characteristics Cooperative  Mood Anxious  Aggressive Behavior  Effect No apparent injury  Thought Process  Coherency WDL  Content WDL  Delusions WDL  Perception WDL  Hallucination None reported or observed  Judgment Impaired  Confusion None  Danger to Self  Current suicidal ideation? Passive  Self-Injurious Behavior Some self-injurious ideation observed or expressed.  No lethal plan expressed   Agreement Not to Harm Self Yes  Description of Agreement verbal contract for safety

## 2022-03-14 NOTE — Progress Notes (Signed)
[facility] Progress Note  03/14/2022 10:02 AM Kristina Owens [place]  MRN:  [id number]  History of Present Illness: The patient is a 56 years old Caucasian who presents to the hospital for suicidal ideation, increased Depression with Psychosis.  The patient states "look I have a lot of mental health issues, physical health problems, I'm homeless, I started hearing voices, I'm an alcoholic, my mind is not right and I wish someone will pump some overdose drugs through through IV and will leave this world. I need help." States she is okay if she sleeps and never wake up.  24 hour EMR reviewed. Case discussed in progression rounds.  PRNs: robaxin and ibuprofen for pain with minimal relief.   Subjective:  Kristina Owens is seen this morning. She has had a worsening of her pelvic/hip pain. Today it is 10/10, sharp. She feels that it is down in her left groin anterior, posterior left hip and down the leg. She is having some nausea. Last BM yesterday afternoon. Some better with rest. Ibuprofen is not helping. Sleep was not very good due to pain. Auditory hallucinations "Sometimes" that are derogatory. She continues to have passive ruminations on death and dying.   Discussed going to the ER for further assessment of her pain and she is in agreement.    Principal Problem: Major depressive disorder, recurrent episode, severe, with psychosis (HCC) Diagnosis: Principal Problem:   Major depressive disorder, recurrent episode, severe, with psychosis (HCC) Active Problems:   GAD (generalized anxiety disorder)   Alcohol dependence (HCC)   Insomnia   Constipation  Total Time spent with patient: 30 minutes  Past Psychiatric History: MDD, PTSD, OCD (check on things repeatedly) & ADHD  Past Medical History:  Past Medical History:  Diagnosis Date   Anxiety    Depression    DVT (deep vein thrombosis) in pregnancy    GERD (gastroesophageal reflux disease)    Ovarian cyst     Past Surgical History:  Procedure Laterality  Date   APPENDECTOMY  age 31   IR PTA VENOUS EXCEPT DIALYSIS CIRCUIT  01/16/2021   IR RADIOLOGIST EVAL & MGMT  03/19/2021   IR THROMBECT VENO MECH MOD SED  01/16/2021   IR US GUIDE VASC ACCESS LEFT  01/16/2021   IR VENO/EXT/UNI LEFT  01/16/2021   IR VENOCAVAGRAM IVC  01/16/2021   TONSILLECTOMY Bilateral age 41   Family History:  Family History  Problem Relation Age of Onset   Asthma Mother    COPD Mother    Cancer Father        thyroid cancer   Family Psychiatric  History: denies Social History:  Social History   Substance and Sexual Activity  Alcohol Use Yes     Social History   Substance and Sexual Activity  Drug Use Yes   Types: "Crack" cocaine, Cocaine, Methamphetamines   Comment: last used 4/31/22    Social History   Socioeconomic History   Marital status: Divorced    Spouse name: Not on file   Number of children: Not on file   Years of education: Not on file   Highest education level: Not on file  Occupational History   Not on file  Tobacco Use   Smoking status: Every Day    Packs/day: 0.50    Years: 35.00    Total pack years: 17.50    Types: Cigarettes   Smokeless tobacco: Never  Vaping Use   Vaping Use: Never used  Substance and Sexual Activity   Alcohol use: Yes  Drug use: Yes    Types: "Crack" cocaine, Cocaine, Methamphetamines    Comment: last used 4/31/22   Sexual activity: Not on file  Other Topics Concern   Not on file  Social History Narrative   Not on file   Social Determinants of Health   Financial Resource Strain: Not on file  Food Insecurity: Not on file  Transportation Needs: Not on file  Physical Activity: Not on file  Stress: Not on file  Social Connections: Not on file   Additional Social History: divorced. Currently homeless. Has court on Sept 1st.                         Sleep: Poor  Appetite:  Fair  Current Medications: Current Facility-Administered Medications  Medication Dose Route Frequency Provider Last  Rate Last Admin   acetaminophen (TYLENOL) tablet 650 mg  650 mg Oral Q6H PRN Kristina Byes C, NP   650 mg at 03/13/22 1647   alum & mag hydroxide-simeth (MAALOX/MYLANTA) 200-200-20 MG/5ML suspension 30 mL  30 mL Oral Q4H PRN Kristina Byes C, NP       ARIPiprazole (ABILIFY) tablet 10 mg  10 mg Oral Daily Kristina Owens, Kristina Hutching, Owens   10 mg at 03/14/22 0747   docusate sodium (COLACE) capsule 100 mg  100 mg Oral BID Kristina Owens   100 mg at 03/13/22 1645   escitalopram (LEXAPRO) tablet 10 mg  10 mg Oral Daily Kristina Dan O, NP   10 mg at 03/14/22 0747   feeding supplement (ENSURE ENLIVE / ENSURE PLUS) liquid 237 mL  237 mL Oral BID BM Kristina Owens, Kristina E, NP   237 mL at 03/14/22 5809   hydrOXYzine (ATARAX) tablet 25 mg  25 mg Oral TID PRN Kristina Byes C, NP   25 mg at 03/13/22 2121   ibuprofen (ADVIL) tablet 600 mg  600 mg Oral Q6H PRN Kristina Owens   600 mg at 03/13/22 2121   magnesium hydroxide (MILK OF MAGNESIA) suspension 30 mL  30 mL Oral Daily PRN Kristina Byes C, NP       methocarbamol (ROBAXIN) tablet 500 mg  500 mg Oral Q6H PRN Kristina Byes C, NP   500 mg at 03/14/22 0641   multivitamin with minerals tablet 1 tablet  1 tablet Oral Daily Kristina Dan O, NP   1 tablet at 03/14/22 0747   nicotine (NICODERM CQ - dosed in mg/24 hours) patch 21 mg  21 mg Transdermal Daily Kristina Dan O, NP   21 mg at 03/14/22 0751   rivaroxaban (XARELTO) tablet 20 mg  20 mg Oral Q supper Kristina Byes C, NP   20 mg at 03/13/22 1645   thiamine (VITAMIN B1) tablet 100 mg  100 mg Oral Daily Kristina Dan O, NP   100 mg at 03/14/22 0747   traMADol (ULTRAM) tablet 50 mg  50 mg Oral Once Kristina Owens       traZODone (DESYREL) tablet 50 mg  50 mg Oral QHS PRN Kristina Byes C, NP   50 mg at 03/13/22 2121    Lab Results:  Results for orders placed or performed during the hospital encounter of 03/10/22 (from the past 48 hour(s))  Lipid panel      Status: Abnormal   Collection Time: 03/13/22  6:22 AM  Result Value Ref Range   Cholesterol 194 0 - 200 mg/dL   Triglycerides 53 <983 mg/dL   HDL 54 >  40 mg/dL   Total CHOL/HDL Ratio 3.6 RATIO   VLDL 11 0 - 40 mg/dL   LDL Cholesterol 696 (H) 0 - 99 mg/dL    Comment:        Total Cholesterol/HDL:CHD Risk Coronary Heart Disease Risk Table                     Men   Women  1/2 Average Risk   3.4   3.3  Average Risk       5.0   4.4  2 X Average Risk   9.6   7.1  3 X Average Risk  23.4   11.0        Use the calculated Patient Ratio above and the CHD Risk Table to determine the patient's CHD Risk.        ATP III CLASSIFICATION (LDL):  <100     mg/dL   Optimal  295-284  mg/dL   Near or Above                    Optimal  130-159  mg/dL   Borderline  132-440  mg/dL   High  >102     mg/dL   Very High Performed at [facility], [address] [place], [place] [address]   Hemoglobin A1c     Status: None   Collection Time: 03/13/22  6:22 AM  Result Value Ref Range   Hgb A1c MFr Bld 5.1 4.8 - 5.6 %    Comment: (NOTE) Pre diabetes:          5.7%-6.4%  Diabetes:              >6.4%  Glycemic control for   <7.0% adults with diabetes    Mean Plasma Glucose 99.67 mg/dL    Comment: Performed at [facility], [address], [place], [place] [address]    Blood Alcohol level:  Lab Results  Component Value Date   ETH 76 (H) 03/08/2022   ETH <10 01/18/2022    Metabolic Disorder Labs: Lab Results  Component Value Date   HGBA1C 5.1 03/13/2022   MPG 99.67 03/13/2022   No results found for: "PROLACTIN" Lab Results  Component Value Date   CHOL 194 03/13/2022   TRIG 53 03/13/2022   HDL 54 03/13/2022   CHOLHDL 3.6 03/13/2022   VLDL 11 03/13/2022   LDLCALC 129 (H) 03/13/2022    Physical Findings: AIMS:  , ,  ,  ,    CIWA:  CIWA-Ar Total: 4 COWS:     Musculoskeletal: Strength & Muscle Tone: within normal limits Gait & Station: normal Patient leans:  N/A  Psychiatric Specialty Exam:  Presentation  General Appearance: Appropriate for Environment  Eye Contact:Fair  Speech:Normal Rate  Speech Volume:Normal  Handedness:Right   Mood and Affect  Mood:Depressed; Anxious  Affect:Congruent   Thought Process  Thought Processes:Linear  Descriptions of Associations:Intact  Orientation:Full (Time, Place and Person)  Thought Content:Logical  History of Schizophrenia/Schizoaffective disorder:No  Duration of Psychotic Symptoms:No data recorded Hallucinations:No data recorded  Ideas of Reference:None  Suicidal Thoughts:No data recorded  Homicidal Thoughts:No data recorded   Sensorium  Memory:Immediate Good; Recent Fair; Remote Fair  Judgment:Fair  Insight:Fair   Executive Functions  Concentration:Fair  Attention Span:Fair  Recall:Fair  Fund of Knowledge:Fair  Language:Fair   Psychomotor Activity  Psychomotor Activity:No data recorded   Assets  Assets:Communication Skills   Sleep  Sleep:No data recorded    Physical Exam:  Physical Exam Vitals and nursing note reviewed.  Constitutional:      Appearance: Normal appearance.  HENT:     Head: Normocephalic.     Nose: Nose normal.  Eyes:     Extraocular Movements: Extraocular movements intact.  Pulmonary:     Effort: Pulmonary effort is normal.  Abdominal:     General: Abdomen is flat.  Musculoskeletal:        General: Normal range of motion.     Cervical back: Normal range of motion.  Neurological:     General: No focal deficit present.     Mental Status: She is alert and oriented to person, place, and time.    Review of Systems  Constitutional:  Negative for fever.  HENT:  Negative for hearing loss.   Respiratory:  Negative for cough.   Cardiovascular:  Negative for chest pain and palpitations.  Gastrointestinal:  Positive for abdominal pain. Negative for constipation, diarrhea, nausea and vomiting.  Genitourinary:  Negative for  dysuria and frequency.  Musculoskeletal:  Positive for back pain, joint pain and myalgias.  Skin:  Negative for rash.  Neurological:  Negative for dizziness and headaches.  Psychiatric/Behavioral:  Positive for depression, hallucinations and suicidal ideas. Negative for memory loss. The patient is nervous/anxious.    Blood pressure 97/77, pulse 62, temperature 98.6 F (37 Owens), temperature source Oral, resp. rate 16, height 5\' 3"  (1.6 m), weight 53.1 kg, SpO2 100 %. Body mass index is 20.73 kg/m.   Treatment Plan Summary: Daily contact with patient to assess and evaluate symptoms and progress in treatment and Medication management  Medications: Mood/anxiety: continue group therapy, milieu therapy, 1:1 evaluation with provider.  Abilify 10 mg PO daily antidepressant augment and mood stabilizer Lexapro 10mg  PO daily for depression and anxiety Hydroxyzine PRN anxiety Substance Abuse: brief intervention provided abstinence advised.  Continue ativan taper, thiamine replacement Rehab referral pending Abdominal/pelvic pain: sending to ER for further evaluation to rule out emergent condition, fracture, or DVT in pelvis KUB and pelvic XR reviewed.  Colace BID for constipation Ibuprofen ordered PRN cramping Gabapentin/lyrica not allowed at most rehab Medical: PRNs for pain, constipation, indigestion available.  Continue xarelto for history DVT and saddle embolus Safety and Monitoring: voluntary admission to inpatient psychiatric unit for safety, stabilization and treatment Daily contact with patient to assess and evaluate symptoms and progress in treatment Patient's case to be discussed in multi-disciplinary team meeting Observation Level : q15 minute checks Vital signs: q12 hours Precautions: suicide, withdrawal   6. Dispo: coordinating with SW for substance abuse placement. Patient has been tentatively accepted to [facility] for Tuesday.   [facility], Owens 03/14/2022, 10:02 AM

## 2022-03-14 NOTE — [facility] Group Notes (Signed)
Patient did not attend group.

## 2022-03-14 NOTE — Plan of Care (Signed)
  Problem: Education: Goal: Knowledge of [facility] Education information/materials will improve Outcome: Progressing Goal: Emotional status will improve Outcome: Progressing Goal: Mental status will improve Outcome: Progressing Goal: Verbalization of understanding the information provided will improve Outcome: Progressing   Problem: Activity: Goal: Interest or engagement in activities will improve Outcome: Progressing Goal: Sleeping patterns will improve Outcome: Progressing   Problem: Coping: Goal: Ability to verbalize frustrations and anger appropriately will improve Outcome: Progressing Goal: Ability to demonstrate self-control will improve Outcome: Progressing   Problem: Health Behavior/Discharge Planning: Goal: Identification of resources available to assist in meeting health care needs will improve Outcome: Progressing Goal: Compliance with treatment plan for underlying cause of condition will improve Outcome: Progressing   Problem: Physical Regulation: Goal: Ability to maintain clinical measurements within normal limits will improve Outcome: Progressing   Problem: Safety: Goal: Periods of time without injury will increase Outcome: Progressing   Problem: Education: Goal: Utilization of techniques to improve thought processes will improve Outcome: Progressing Goal: Knowledge of the prescribed therapeutic regimen will improve Outcome: Progressing   Problem: Activity: Goal: Interest or engagement in leisure activities will improve Outcome: Progressing Goal: Imbalance in normal sleep/wake cycle will improve Outcome: Progressing   Problem: Coping: Goal: Coping ability will improve Outcome: Progressing Goal: Will verbalize feelings Outcome: Progressing   Problem: Health Behavior/Discharge Planning: Goal: Ability to make decisions will improve Outcome: Progressing Goal: Compliance with therapeutic regimen will improve Outcome: Progressing    Problem: Role Relationship: Goal: Will demonstrate positive changes in social behaviors and relationships Outcome: Progressing   Problem: Safety: Goal: Ability to disclose and discuss suicidal ideas will improve Outcome: Progressing Goal: Ability to identify and utilize support systems that promote safety will improve Outcome: Progressing   Problem: Self-Concept: Goal: Will verbalize positive feelings about self Outcome: Progressing Goal: Level of anxiety will decrease Outcome: Progressing   Problem: Education: Goal: Ability to make informed decisions regarding treatment will improve Outcome: Progressing   Problem: Coping: Goal: Coping ability will improve Outcome: Progressing   Problem: Health Behavior/Discharge Planning: Goal: Identification of resources available to assist in meeting health care needs will improve Outcome: Progressing   Problem: Medication: Goal: Compliance with prescribed medication regimen will improve Outcome: Progressing   Problem: Self-Concept: Goal: Ability to disclose and discuss suicidal ideas will improve Outcome: Progressing Goal: Will verbalize positive feelings about self Outcome: Progressing   Problem: Education: Goal: Knowledge of disease or condition will improve Outcome: Progressing Goal: Understanding of discharge needs will improve Outcome: Progressing   Problem: Health Behavior/Discharge Planning: Goal: Ability to identify changes in lifestyle to reduce recurrence of condition will improve Outcome: Progressing Goal: Identification of resources available to assist in meeting health care needs will improve Outcome: Progressing   Problem: Physical Regulation: Goal: Complications related to the disease process, condition or treatment will be avoided or minimized Outcome: Progressing   Problem: Safety: Goal: Ability to remain free from injury will improve Outcome: Progressing   

## 2022-03-14 NOTE — Progress Notes (Signed)
Patient appears in pain. Patient denies SI/HI/AVH. Patient complied with morning medication with no reported side effects. Pt reports 10/10 back and pelvic pain, is worried her kidneys are swollen. MD is sending pt non-emergent to the ER via EMS. Patient remains safe on Q24min checks and contracts for safety.      03/14/22 1017  Psych Admission Type (Psych Patients Only)  Admission Status Voluntary  Psychosocial Assessment  Patient Complaints Anxiety;Sleep disturbance  Eye Contact Fair  Facial Expression Anxious  Affect Anxious;Depressed  Speech Soft  Interaction Assertive  Motor Activity Slow  Appearance/Hygiene Unremarkable  Behavior Characteristics Cooperative;Anxious  Mood Depressed;Anxious  Thought Process  Coherency WDL  Content WDL  Delusions None reported or observed  Perception WDL  Hallucination None reported or observed  Judgment Impaired  Confusion None  Danger to Self  Current suicidal ideation? Denies  Self-Injurious Behavior No self-injurious ideation or behavior indicators observed or expressed   Agreement Not to Harm Self Yes  Description of Agreement verbal  Danger to Others  Danger to Others None reported or observed

## 2022-03-14 NOTE — Progress Notes (Signed)
EMS picked up pt and is bringing her to [facility].

## 2022-03-15 MED ORDER — BENZOCAINE 10 % MT GEL
Freq: Two times a day (BID) | OROMUCOSAL | Status: DC | PRN
  Administered 2022-03-15 – 2022-03-16 (×2): 1 via OROMUCOSAL
  Filled 2022-03-15: qty 9

## 2022-03-15 MED ORDER — CEPHALEXIN 250 MG PO CAPS
500.0000 mg | ORAL_CAPSULE | Freq: Four times a day (QID) | ORAL | Status: DC
Start: 2022-03-15 — End: 2022-03-20
  Administered 2022-03-15 – 2022-03-20 (×20): 500 mg via ORAL
  Filled 2022-03-15 (×10): qty 2
  Filled 2022-03-15: qty 1
  Filled 2022-03-15 (×7): qty 2
  Filled 2022-03-15: qty 1
  Filled 2022-03-15 (×8): qty 2
  Filled 2022-03-15 (×2): qty 1
  Filled 2022-03-15 (×3): qty 2

## 2022-03-15 NOTE — [facility] Group Notes (Signed)
Psychoeducational Group Note    Date:  03/15/2022 Time: 1300-1400    Purpose of Group: . The group focus' on teaching patients on how to identify their needs and their Life Skills:  A group where two lists are made. What people need and what are things that we do that are unhealthy. The lists are developed by the patients and it is explained that we often do the actions that are not healthy to get our list of needs met.  Goal:: to develop the coping skills needed to get their needs met  Participation Level:  did not attend  Stachia Slutsky A  

## 2022-03-15 NOTE — Group Note (Signed)
Date:  03/15/2022 Time:  9:21 AM  Group Topic/Focus:  Goals Group:   The focus of this group is to help patients establish daily goals to achieve during treatment and discuss how the patient can incorporate goal setting into their daily lives to aide in recovery. Orientation:   The focus of this group is to educate the patient on the purpose and policies of crisis stabilization and provide a format to answer questions about their admission.  The group details unit policies and expectations of patients while admitted.    Participation Level:  Active  Participation Quality:  Appropriate  Affect:  Appropriate  Cognitive:  Appropriate  Insight: Appropriate  Engagement in Group:  Engaged  Modes of Intervention:  Discussion  Additional Comments:  Pt wants to talk to SW about discharge planning.  Jaquita Rector 03/15/2022, 9:21 AM

## 2022-03-15 NOTE — Progress Notes (Signed)
D. Pt has been appropriate on the unit- continues to complain of UTI symptoms and lower back pain. Per pt's self inventory, pt rated her depression,hopelessness and anxiety a 04/12/09, respectively.Pt reported she was still grieving over her mother's death. Pt has been visible in the milieu interacting appropriately with peers, and observed attending groups. Pt currently denies SI/HI and AVH  A. Labs and vitals monitored. Pt given scheduled medications and prn meds for comfort. Pt supported emotionally and encouraged to express concerns and ask questions.   R. Pt remains safe with 15 minute checks. Will continue POC.

## 2022-03-15 NOTE — [facility] Group Notes (Signed)
Goals Group 78/2023   Group Focus: affirmation, clarity of thought, and goals/reality orientation Treatment Modality:  Psychoeducation Interventions utilized were assignment, group exercise, and support Purpose: To be able to understand and verbalize the reason for their admission to the hospital. To understand that the medication helps with their chemical imbalance but they also need to work on their choices in life. To be challenged to develop [name] of 30 positives about themselves. Also introduce the concept that "feelings" are not reality.  Participation Level:  did not attend  Kristina Owens 

## 2022-03-15 NOTE — Progress Notes (Signed)
[name] MD Progress Note  03/15/2022 3:07 PM Kristina Owens  MRN:  [id number]  Subjective:  Kristina Owens is seen this morning, she reports, "I went to the doctor in the hospital and they started me on Keflex 500 mg p.o. 4 times a day for 5 days from a urinary tract infection,  zofran for N/V and colace for constipation."  History of Present Illness: The patient is a 56 years old Caucasian who presents to the hospital for suicidal ideation, increased Depression with Psychosis.  The patient states "look I have a lot of mental health issues, physical health problems, I'm homeless, I started hearing voices, I'm an alcoholic, my mind is not right and I wish someone will pump some overdose drugs through through IV and will leave this world. I need help." States she is okay if she sleeps and never wake up.  As needed medications received in past 24 hours Per MAR review include: Robaxin, Tylenol, hydroxyzine, trazodone, and ibuprofen.   Assessment: On assessment today, patient is seen face-to-face and examined in the office.  Chart reviewed and findings shared with the treatment team and discussed with Dr. Abbott Pao. Patient continues to complain of her pelvic/hip pain. Today it is 5/10, sharp. She feels that it is down in her left groin anterior, posterior left hip and down the leg.  As needed medication was administered with some relief.  She denies nausea and vomiting.  Last BM today. Sleep for 5 hours last night and more restful compared to yesterday.  Auditory hallucinations "Sometimes" that are telling her to do some stuff and sometimes telling her to be by herself.  Reports schizophrenic history on maternal side of her family.  She continues to have passive ruminations on death and dying due to recent loss of her mother.  Urinalysis result on 03/14/2022 indicates urine appearance hazy, and large leukocytes, bacteria rare patient already started on Keflex 500 mg p.o. 4 times daily x 5 days. Patient plans to go to  [facility] treatment rehab after discharge from [facility].  Denies suicidal ideation homicidal ideation or visual hallucinations. Reports social anxiety and rates anxiety as 10/10, on a scale of 0-10 with 10 being the worst.  Encouraged to ask for as needed medication for anxiety as needed.   Principal Problem: Major depressive disorder, recurrent episode, severe, with psychosis (HCC) Diagnosis: Principal Problem:   Major depressive disorder, recurrent episode, severe, with psychosis (HCC) Active Problems:   GAD (generalized anxiety disorder)   Alcohol dependence (HCC)   Insomnia   Constipation  Total Time spent with patient: 30 minutes  Past Psychiatric History: MDD, PTSD, OCD (check on things repeatedly) & ADHD  Past Medical History:  Past Medical History:  Diagnosis Date   Anxiety    Depression    DVT (deep vein thrombosis) in pregnancy    GERD (gastroesophageal reflux disease)    Ovarian cyst     Past Surgical History:  Procedure Laterality Date   APPENDECTOMY  age 51   IR PTA VENOUS EXCEPT DIALYSIS CIRCUIT  01/16/2021   IR RADIOLOGIST EVAL & MGMT  03/19/2021   IR THROMBECT VENO MECH MOD SED  01/16/2021   IR US GUIDE VASC ACCESS LEFT  01/16/2021   IR VENO/EXT/UNI LEFT  01/16/2021   IR VENOCAVAGRAM IVC  01/16/2021   TONSILLECTOMY Bilateral age 45   Family History:  Family History  Problem Relation Age of Onset   Asthma Mother    COPD Mother    Cancer Father  thyroid cancer   Family Psychiatric  History: denies Social History:  Social History   Substance and Sexual Activity  Alcohol Use Yes     Social History   Substance and Sexual Activity  Drug Use Yes   Types: "Crack" cocaine, Cocaine, Methamphetamines   Comment: last used 4/31/22    Social History   Socioeconomic History   Marital status: Divorced    Spouse name: Not on file   Number of children: Not on file   Years of education: Not on file   Highest education level: Not on file  Occupational History    Not on file  Tobacco Use   Smoking status: Every Day    Packs/day: 0.50    Years: 35.00    Total pack years: 17.50    Types: Cigarettes   Smokeless tobacco: Never  Vaping Use   Vaping Use: Never used  Substance and Sexual Activity   Alcohol use: Yes   Drug use: Yes    Types: "Crack" cocaine, Cocaine, Methamphetamines    Comment: last used 4/31/22   Sexual activity: Not on file  Other Topics Concern   Not on file  Social History Narrative   Not on file   Social Determinants of Health   Financial Resource Strain: Not on file  Food Insecurity: Not on file  Transportation Needs: Not on file  Physical Activity: Not on file  Stress: Not on file  Social Connections: Not on file   Additional Social History: divorced. Currently homeless. Has court on Sept 1st.                         Sleep: Poor  Appetite:  Fair  Current Medications: Current Facility-Administered Medications  Medication Dose Route Frequency Provider Last Rate Last Admin   acetaminophen (TYLENOL) tablet 650 mg  650 mg Oral Q6H PRN Dahlia Byes C, NP   650 mg at 03/15/22 1124   alum & mag hydroxide-simeth (MAALOX/MYLANTA) 200-200-20 MG/5ML suspension 30 mL  30 mL Oral Q4H PRN Dahlia Byes C, NP       ARIPiprazole (ABILIFY) tablet 10 mg  10 mg Oral Daily Hill, Shelbie Hutching, MD   10 mg at 03/15/22 0730   benzocaine (ORAJEL) 10 % mucosal gel   Mouth/Throat BID PRN Babak Lucus, Jesusita Oka, FNP       cephALEXin (KEFLEX) capsule 500 mg  500 mg Oral Q6H Latica Hohmann C, FNP       docusate sodium (COLACE) capsule 100 mg  100 mg Oral BID Hill, Shelbie Hutching, MD   100 mg at 03/15/22 0730   escitalopram (LEXAPRO) tablet 10 mg  10 mg Oral Daily Daneil Dan O, NP   10 mg at 03/15/22 0730   feeding supplement (ENSURE ENLIVE / ENSURE PLUS) liquid 237 mL  237 mL Oral BID BM Bobbitt, Shalon E, NP   237 mL at 03/15/22 1040   hydrOXYzine (ATARAX) tablet 25 mg  25 mg Oral TID PRN Dahlia Byes C, NP   25 mg  at 03/15/22 0732   ibuprofen (ADVIL) tablet 600 mg  600 mg Oral Q6H PRN Roselle Locus, MD   600 mg at 03/13/22 2121   magnesium hydroxide (MILK OF MAGNESIA) suspension 30 mL  30 mL Oral Daily PRN Dahlia Byes C, NP       methocarbamol (ROBAXIN) tablet 500 mg  500 mg Oral Q6H PRN Dahlia Byes C, NP   500 mg at 03/15/22 1425   multivitamin  with minerals tablet 1 tablet  1 tablet Oral Daily Sonny Dandy, NP   1 tablet at 03/15/22 0730   nicotine (NICODERM CQ - dosed in mg/24 hours) patch 21 mg  21 mg Transdermal Daily Daneil Dan O, NP   21 mg at 03/15/22 0730   rivaroxaban (XARELTO) tablet 20 mg  20 mg Oral Q supper Dahlia Byes C, NP   20 mg at 03/14/22 1843   thiamine (VITAMIN B1) tablet 100 mg  100 mg Oral Daily Daneil Dan O, NP   100 mg at 03/15/22 0730   traZODone (DESYREL) tablet 50 mg  50 mg Oral QHS PRN Earney Navy, NP   50 mg at 03/14/22 2116    Lab Results:  Results for orders placed or performed during the hospital encounter of 03/10/22 (from the past 48 hour(s))  Comprehensive metabolic panel     Status: Abnormal   Collection Time: 03/14/22 11:45 AM  Result Value Ref Range   Sodium 138 135 - 145 mmol/L   Potassium 4.3 3.5 - 5.1 mmol/L   Chloride 107 98 - 111 mmol/L   CO2 26 22 - 32 mmol/L   Glucose, Bld 89 70 - 99 mg/dL    Comment: Glucose reference range applies only to samples taken after fasting for at least 8 hours.   BUN 17 6 - 20 mg/dL   Creatinine, Ser 9.47 0.44 - 1.00 mg/dL   Calcium 9.1 8.9 - 65.4 mg/dL   Total Protein 6.5 6.5 - 8.1 g/dL   Albumin 3.6 3.5 - 5.0 g/dL   AST 14 (L) 15 - 41 U/L   ALT 15 0 - 44 U/L   Alkaline Phosphatase 71 38 - 126 U/L   Total Bilirubin 0.3 0.3 - 1.2 mg/dL   GFR, Estimated >65 >03 mL/min    Comment: (NOTE) Calculated using the CKD-EPI Creatinine Equation (2021)    Anion gap 5 5 - 15    Comment: Performed at [facility], [address] [place], [place] [address]   Lipase, blood     Status: None   Collection Time: 03/14/22 11:45 AM  Result Value Ref Range   Lipase 32 11 - 51 U/L    Comment: Performed at [facility], [address], [place], [place] [address]  CBC with Differential     Status: None   Collection Time: 03/14/22 11:45 AM  Result Value Ref Range   WBC 8.8 4.0 - 10.5 K/uL   RBC 4.37 3.87 - 5.11 MIL/uL   Hemoglobin 13.2 12.0 - 15.0 g/dL   HCT 17.0 01.7 - 49.4 %   MCV 91.3 80.0 - 100.0 fL   MCH 30.2 26.0 - 34.0 pg   MCHC 33.1 30.0 - 36.0 g/dL   RDW 49.6 75.9 - 16.3 %   Platelets 230 150 - 400 K/uL   nRBC 0.0 0.0 - 0.2 %   Neutrophils Relative % 52 %   Neutro Abs 4.7 1.7 - 7.7 K/uL   Lymphocytes Relative 36 %   Lymphs Abs 3.1 0.7 - 4.0 K/uL   Monocytes Relative 9 %   Monocytes Absolute 0.8 0.1 - 1.0 K/uL   Eosinophils Relative 2 %   Eosinophils Absolute 0.2 0.0 - 0.5 K/uL   Basophils Relative 0 %   Basophils Absolute 0.0 0.0 - 0.1 K/uL   Immature Granulocytes 1 %   Abs Immature Granulocytes 0.04 0.00 - 0.07 K/uL    Comment: Performed at [facility],  [address] [place], [place] [address]  Urinalysis, Routine w reflex microscopic Urine, Clean Catch     Status: Abnormal   Collection Time: 03/14/22  1:18 PM  Result Value Ref Range   Color, Urine YELLOW YELLOW   APPearance HAZY (A) CLEAR   Specific Gravity, Urine 1.012 1.005 - 1.030   pH 6.0 5.0 - 8.0   Glucose, UA NEGATIVE NEGATIVE mg/dL   Hgb urine dipstick NEGATIVE NEGATIVE   Bilirubin Urine NEGATIVE NEGATIVE   Ketones, ur NEGATIVE NEGATIVE mg/dL   Protein, ur NEGATIVE NEGATIVE mg/dL   Nitrite NEGATIVE NEGATIVE   Leukocytes,Ua LARGE (A) NEGATIVE   RBC / HPF 0-5 0 - 5 RBC/hpf   WBC, UA 21-50 0 - 5 WBC/hpf   Bacteria, UA RARE (A) NONE SEEN   Squamous Epithelial / LPF 0-5 0 - 5    Comment: Performed at [facility], [address] [place], [place] [address]  Pregnancy, urine     Status: None   Collection  Time: 03/14/22  1:18 PM  Result Value Ref Range   Preg Test, Ur NEGATIVE NEGATIVE    Comment:        THE SENSITIVITY OF THIS METHODOLOGY IS >20 mIU/mL. Performed at [facility], [address] [place], [place] [address]     Blood Alcohol level:  Lab Results  Component Value Date   ETH 76 (H) 03/08/2022   ETH <10 01/18/2022    Metabolic Disorder Labs: Lab Results  Component Value Date   HGBA1C 5.1 03/13/2022   MPG 99.67 03/13/2022   No results found for: "PROLACTIN" Lab Results  Component Value Date   CHOL 194 03/13/2022   TRIG 53 03/13/2022   HDL 54 03/13/2022   CHOLHDL 3.6 03/13/2022   VLDL 11 03/13/2022   LDLCALC 129 (H) 03/13/2022    Physical Findings: AIMS:  , ,  ,  ,    CIWA:  CIWA-Ar Total: 2 COWS:     Musculoskeletal: Strength & Muscle Tone: within normal limits Gait & Station: normal Patient leans: N/A  Psychiatric Specialty Exam:  Presentation  General Appearance: Appropriate for Environment; Casual; Fairly Groomed  Eye Contact:Good  Speech:Clear and Coherent; Normal Rate  Speech Volume:Normal  Handedness:Right  Mood and Affect  Mood:Anxious; Depressed  Affect:Congruent  Thought Process  Thought Processes:Linear  Descriptions of Associations:Intact  Orientation:Full (Time, Place and Person)  Thought Content:Logical  History of Schizophrenia/Schizoaffective disorder:No  Duration of Psychotic Symptoms:No data recorded Hallucinations:Hallucinations: Auditory Description of Command Hallucinations: Hearing voices telling her to do staff, also telling her to stay by herself and not do anything Description of Visual Hallucinations: Denies visual hallucinations  Ideas of Reference:None  Suicidal Thoughts:Suicidal Thoughts: No SI Active Intent and/or Plan: -- (Denies) SI Passive Intent and/or Plan: -- (Denies)   Homicidal Thoughts:Homicidal Thoughts: No  Sensorium  Memory:Immediate Good; Recent Good; Remote  Good  Judgment:Fair  Insight:Fair  Executive Functions  Concentration:Good  Attention Span:Good  Recall:Fair  Fund of Knowledge:Fair  Language:Good  Psychomotor Activity  Psychomotor Activity:Psychomotor Activity: Normal  Assets  Assets:Communication Skills; Desire for Improvement  Sleep  Sleep:Sleep: Good Number of Hours of Sleep: 5  Physical Exam: Physical Exam Vitals and nursing note reviewed.  Constitutional:      Appearance: Normal appearance.  HENT:     Head: Normocephalic.     Nose: Nose normal.  Eyes:     Extraocular Movements: Extraocular movements intact.  Pulmonary:     Effort: Pulmonary effort is normal.  Abdominal:     General: Abdomen is flat.  Musculoskeletal:        General: Normal range of motion.     Cervical back: Normal range of motion.  Neurological:     General: No focal deficit present.     Mental Status: She is alert and oriented to person, place, and time.    Review of Systems  Constitutional:  Negative for fever.  HENT:  Negative for hearing loss.   Respiratory:  Negative for cough.   Cardiovascular:  Negative for chest pain and palpitations.  Gastrointestinal:  Positive for abdominal pain. Negative for constipation, diarrhea, nausea and vomiting.  Genitourinary:  Negative for dysuria and frequency.  Musculoskeletal:  Positive for back pain, joint pain and myalgias.  Skin:  Negative for rash.  Neurological:  Negative for dizziness and headaches.  Psychiatric/Behavioral:  Positive for depression, hallucinations and suicidal ideas. Negative for memory loss. The patient is nervous/anxious.    Blood pressure 93/65, pulse 63, temperature 98.1 F (36.7 C), temperature source Oral, resp. rate 17, height 5\' 3"  (1.6 m), weight 53.1 kg, SpO2 98 %. Body mass index is 20.73 kg/m.   Treatment Plan Summary: Daily contact with patient to assess and evaluate symptoms and progress in treatment and Medication  management  Medications: Mood/anxiety: continue group therapy, milieu therapy, 1:1 evaluation with provider.  Abilify 10 mg PO daily antidepressant augment and mood stabilizer Lexapro 10mg  PO daily for depression and anxiety Hydroxyzine PRN anxiety UTI: Started on Keflex 500 mg p.o. 4 times daily x 5 days. Substance Abuse: brief intervention provided abstinence advised.  Continue ativan taper, thiamine replacement Rehab referral pending Abdominal/pelvic pain: sending to ER for further evaluation to rule out emergent condition, fracture, or DVT in pelvis KUB and pelvic XR reviewed.  Colace BID for constipation Ibuprofen ordered PRN cramping Gabapentin/lyrica not allowed at most rehab Medical: PRNs for pain, constipation, indigestion available.  Continue xarelto for history DVT and saddle embolus Safety and Monitoring: voluntary admission to inpatient psychiatric unit for safety, stabilization and treatment Daily contact with patient to assess and evaluate symptoms and progress in treatment Patient's case to be discussed in multi-disciplinary team meeting Observation Level : q15 minute checks Vital signs: q12 hours Precautions: suicide, withdrawal   6. Dispo: coordinating with SW for substance abuse placement. Patient has been tentatively accepted to [facility] for Tuesday.   [facility], FNP 03/15/2022, 3:07 PMPatient ID: Cecilie Lowers, female   DOB: Mar 18, 1966, 56 y.o.   MRN: [id number]

## 2022-03-15 NOTE — Group Note (Signed)
LCSW Group Therapy Note No social work group was held today due to newly separated halls necessitating a higher number of groups, a significant number of expected and unexpected discharges, a number of necessary Suicide Prevention Education calls to family members, and a high number of admissions that required initial psychosocial assessments.  The following was provided to each patient on 300 and 500 halls in lieu of in-person group:  Healthy vs. Unhealthy Coping Skills and Supports   Unhealthy Qualities                                             Healthy Qualities Works (at first) Works   Stops working or starts hurting Continues working  Fast Usually takes time to develop  Easy Often difficult to learn  Usually a habit Usually unknown, has to become a habit  Can do alone Often need to reach out for help   Leads to loss Leads to gain         My Unhealthy Coping Skills                                    My Healthy Coping Skills                       My Unhealthy Supports                                           My Healthy Supports                         Kristina Plucinski Grossman-Orr, LCSW 03/15/2022  2:42 PM     

## 2022-03-16 LAB — URINE CULTURE

## 2022-03-16 MED ORDER — ESCITALOPRAM OXALATE 5 MG PO TABS
15.0000 mg | ORAL_TABLET | Freq: Every day | ORAL | Status: DC
Start: 2022-03-17 — End: 2022-03-20
  Administered 2022-03-17 – 2022-03-20 (×4): 15 mg via ORAL
  Filled 2022-03-16 (×7): qty 1

## 2022-03-16 NOTE — B[facility] Group Notes (Signed)
Adult Psychoeducational Group Note Date:  03/16/2022 Time:  0900-1045 Group Topic/Focus: PROGRESSIVE RELAXATION. A group where deep breathing is taught and tensing and relaxation muscle groups is used. Imagery is used as well.  Pts are asked to imagine 3 pillars that hold them up when they are not able to hold themselves up and to share that with the group.  Participation Level:  Active  Participation Quality:  Appropriate  Affect:  Appropriate  Cognitive:  Oriented  Insight: Improving  Engagement in Group:  Engaged  Modes of Intervention:  Activity, Discussion, Education, and Support  Additional Comments:  Pt rates her energy at a 5/10. States the Bible, the power of lyrics and family, hold her up and support her.  Dione Housekeeper

## 2022-03-16 NOTE — Progress Notes (Signed)
Psychoeducational Group Note  Date:  03/16/2022 Time:  2000  Group Topic/Focus:  Wrap up group  Participation Level: Did Not Attend  Participation Quality:  Not Applicable  Affect:  Not Applicable  Cognitive:  Not Applicable  Insight:  Not Applicable  Engagement in Group: Not Applicable  Additional Comments:  Did not attend.   Marcille Buffy 03/16/2022, 8:59 PM

## 2022-03-16 NOTE — BHH Group Notes (Signed)
Adult Psychoeducational Group  Date:  03/16/2022 Time:  1300-1400  Group Topic/Focus: Continuation of the group from Saturday. Looking at the lists that were created and talking about what needs to be done with the homework of 30 positives about themselves.                                     Talking about taking their power back and helping themselves to develop a positive self esteem.      Participation Quality:  Appropriate  Affect:  Appropriate  Cognitive:  Oriented  Insight: Improving  Engagement in Group:  Engaged  Modes of Intervention:  Activity, Discussion, Education, and Support  Additional Comments:  rates her energy at a 6/10. Did her homework of 30 positives. Shared and interacted in the group.  Dione Housekeeper

## 2022-03-16 NOTE — Group Note (Signed)
LCSW Group Therapy Note  03/16/2022      Type of Therapy and Topic:  Group Therapy: Gratitude  Participation Level:  Active   Description of Group:   In this group, patients shared and discussed the importance of acknowledging the elements in their lives for which they are grateful and how this can positively impact their mood.  The group discussed how bringing the positive elements of their lives to the forefront of their minds can help with recovery from any illness, physical or mental.  An exercise was done as a group in which a list was made of gratitude items in order to encourage participants to consider other potential positives in their lives.  Therapeutic Goals: Patients will identify one or more item for which they are grateful in each of 6 categories:  people, experience, thing, place, skill, and other. Patients will discuss how it is possible to seek out gratitude in even bad situations. Patients will explore other possible items of gratitude that they could remember.   Summary of Patient Progress:  The patient shared that her gratitude is for her sister's support.  She listened throughout group but was not participatory.   Therapeutic Modalities:   Solution-Focused Therapy Activity  Carloyn Jaeger Grossman-Orr, LCSW .

## 2022-03-16 NOTE — Progress Notes (Signed)
D. Pt continues to present as depressed, but brightens upon approach. Pt continues to complain of fatigue, dizziness and left sided lower back pain, with prn meds offering little relief. Pt has been compliant with her meds, and has been attending groups. Pt rated her depression, hopelessness and anxiety a 05/11/09, respectively. Pt reported that her goal was "to feel better." Pt continues to endorse passive SI- no plan- denied HI and AVH and agrees to contact staff before acting on any harmful thoughts. Pt reported that she hopes to go to [facility] upon discharge and is requesting to talk to the SW about this.  A. Labs and vitals monitored. Pt given and educated on medications. Pt supported emotionally and encouraged to express concerns and ask questions.   R. Pt remains safe with 15 minute checks. Will continue POC.

## 2022-03-16 NOTE — [facility] Group Notes (Signed)
Child/Adolescent Psychoeducational Group Note  Date:  03/16/2022 Time:  10:40 AM  Group Topic/Focus:  Goals Group:   The focus of this group is to help patients establish daily goals to achieve during treatment and discuss how the patient can incorporate goal setting into their daily lives to aide in recovery.  Participation Level:  Active  Participation Quality:  Attentive  Affect:  Appropriate  Cognitive:  Appropriate  Insight:  Appropriate  Engagement in Group:  Engaged  Modes of Intervention:  Discussion  Additional Comments:  Patient attended goals group and was attentive the duration of it. Patient's goal was to get a discharge plan and talk to the social-worker.   Wiktoria Hemrick T Shanaya Schneck 03/16/2022, 10:40 AM

## 2022-03-16 NOTE — Progress Notes (Signed)
Pt affect flat, mood depressed, rated her day a 3/10, depression 9/10 and anxiety 8/10. Pt states she is having a hard time since her mother passed in May. Pt received trazodone, vistaril as requested. Currently denies SI/HI or hallucinations (a) 15 min checks (r) safety maintained.

## 2022-03-16 NOTE — Progress Notes (Signed)
Patient ID: Kristina Owens, female   DOB: 1966-02-12, 56 y.o.   MRN: [id number]   Made patient aware that her Lexapro dose has been increased to 15 mg po daily with possible discharge date on Wednesday 03/19/22. Reports still needing help to obtain an ID before discharge.  Alan Mulder, NP Psychiatry.

## 2022-03-16 NOTE — Progress Notes (Signed)
[facility] MD Progress Note  03/16/2022 11:38 AM Kyisha [place]  MRN:  [id number]  Subjective:  Julietta is seen this morning, she reports, "I do not feel good, I am very anxious my back hurts, I still hear things and I am depressed."  History of Present Illness: The patient is a 56 years old Caucasian who presents to the hospital for suicidal ideation, increased Depression with Psychosis.  The patient states "look I have a lot of mental health issues, physical health problems, I'm homeless, I started hearing voices, I'm an alcoholic, my mind is not right and I wish someone will pump some overdose drugs through through IV and will leave this world. I need help." States she is okay if she sleeps and never wake up.  PRN medications received in past 24 hours Per MAR review include: Orajel, hydroxyzine, ibuprofen, Robaxin, and trazodone.   Yesterday's psychiatric team recommendations include: --Continue Abilify 10 mg PO daily antidepressant augment and mood stabilization --Continue Lexapro 10 mg PO daily for depression and anxiety --Continue hydroxyzine 25 mg po PRN anxiety  Assessment: On assessment today, patient is seen face-to-face and examined in her room, sitting on the bed. Chart reviewed and findings shared with the treatment team and discussed with Dr. Abbott Pao. Patient continues to complain of her pelvic/hip/back pain. Today pain is 8/10, and achy. She feels that it is down in her left groin anterior, and posterior left hip.  As needed medication was administered as indicated above, with some relief.  Requesting to know if Flexeril will be better than Robaxin.  Made patient aware that Flexeril may cause sleepiness, that she may need to continue on Robaxin.  Endorses good appetite and she denies nausea, vomiting or GI pain.  Last BM today, 03/16/2022. Sleep for 6 hours last night and not still restful.  Patient continues on Keflex 500 mg p.o. 4 times daily x 4 more days. Denies suicidal ideation  homicidal ideation or visual hallucinations. Auditory hallucinations continues telling her it will be "ok." Reports schizophrenic history on maternal side of her family.   Reports social anxiety and rates anxiety as 10/10, on a scale of 0-10 with 10 being the worst. Patient reports increase depressive mood, and rates as 10/10 on a scale of 0-10, 10 being the worst.  Not sure if this symptoms are related to her homelessness.  Patient plans to go to [facility] treatment rehab after discharge from [facility], however, added, I lost my ID, [name] is requiring ID, I did not know how the social worker would help me to obtain a new ID. Discussed with the patient, the impact of alcohol use and dependence on  overall psychiatric and medical wellbeing, and total abstinence from alcohol use was recommended to the patient.     Principal Problem: Major depressive disorder, recurrent episode, severe, with psychosis (HCC) Diagnosis: Principal Problem:   Major depressive disorder, recurrent episode, severe, with psychosis (HCC) Active Problems:   GAD (generalized anxiety disorder)   Alcohol dependence (HCC)   Insomnia   Constipation  Total Time spent with patient: 30 minutes  Past Psychiatric History: MDD, PTSD, OCD (check on things repeatedly) & ADHD  Past Medical History:  Past Medical History:  Diagnosis Date   Anxiety    Depression    DVT (deep vein thrombosis) in pregnancy    GERD (gastroesophageal reflux disease)    Ovarian cyst     Past Surgical History:  Procedure Laterality Date   APPENDECTOMY  age 3   IR PTA VENOUS  EXCEPT DIALYSIS CIRCUIT  01/16/2021   IR RADIOLOGIST EVAL & MGMT  03/19/2021   IR THROMBECT VENO MECH MOD SED  01/16/2021   IR US GUIDE VASC ACCESS LEFT  01/16/2021   IR VENO/EXT/UNI LEFT  01/16/2021   IR VENOCAVAGRAM IVC  01/16/2021   TONSILLECTOMY Bilateral age 39   Family History:  Family History  Problem Relation Age of Onset   Asthma Mother    COPD Mother    Cancer Father         thyroid cancer   Family Psychiatric  History: denies Social History:  Social History   Substance and Sexual Activity  Alcohol Use Yes     Social History   Substance and Sexual Activity  Drug Use Yes   Types: "Crack" cocaine, Cocaine, Methamphetamines   Comment: last used 4/31/22    Social History   Socioeconomic History   Marital status: Divorced    Spouse name: Not on file   Number of children: Not on file   Years of education: Not on file   Highest education level: Not on file  Occupational History   Not on file  Tobacco Use   Smoking status: Every Day    Packs/day: 0.50    Years: 35.00    Total pack years: 17.50    Types: Cigarettes   Smokeless tobacco: Never  Vaping Use   Vaping Use: Never used  Substance and Sexual Activity   Alcohol use: Yes   Drug use: Yes    Types: "Crack" cocaine, Cocaine, Methamphetamines    Comment: last used 4/31/22   Sexual activity: Not on file  Other Topics Concern   Not on file  Social History Narrative   Not on file   Social Determinants of Health   Financial Resource Strain: Not on file  Food Insecurity: Not on file  Transportation Needs: Not on file  Physical Activity: Not on file  Stress: Not on file  Social Connections: Not on file   Additional Social History: divorced. Currently homeless. Has court on Sept 1st.    Sleep: Poor  Appetite:  Fair  Current Medications: Current Facility-Administered Medications  Medication Dose Route Frequency Provider Last Rate Last Admin   acetaminophen (TYLENOL) tablet 650 mg  650 mg Oral Q6H PRN Dahlia Byes C, NP   650 mg at 03/15/22 1124   alum & mag hydroxide-simeth (MAALOX/MYLANTA) 200-200-20 MG/5ML suspension 30 mL  30 mL Oral Q4H PRN Dahlia Byes C, NP       ARIPiprazole (ABILIFY) tablet 10 mg  10 mg Oral Daily Hill, Shelbie Hutching, MD   10 mg at 03/16/22 0801   benzocaine (ORAJEL) 10 % mucosal gel   Mouth/Throat BID PRN Cecilie Lowers, FNP   1 Application at  03/16/22 0809   cephALEXin (KEFLEX) capsule 500 mg  500 mg Oral Q6H Antoninette Lerner C, FNP   500 mg at 03/16/22 4540   docusate sodium (COLACE) capsule 100 mg  100 mg Oral BID Roselle Locus, MD   100 mg at 03/16/22 0801   escitalopram (LEXAPRO) tablet 10 mg  10 mg Oral Daily Daneil Dan O, NP   10 mg at 03/16/22 0801   feeding supplement (ENSURE ENLIVE / ENSURE PLUS) liquid 237 mL  237 mL Oral BID BM Bobbitt, Shalon E, NP   237 mL at 03/16/22 1045   hydrOXYzine (ATARAX) tablet 25 mg  25 mg Oral TID PRN Dahlia Byes C, NP   25 mg at 03/16/22 [phone]  ibuprofen (ADVIL) tablet 600 mg  600 mg Oral Q6H PRN Roselle LocusHill, Stephanie Leigh, MD   600 mg at 03/16/22 0806   magnesium hydroxide (MILK OF MAGNESIA) suspension 30 mL  30 mL Oral Daily PRN Earney Navynuoha, Josephine C, NP       methocarbamol (ROBAXIN) tablet 500 mg  500 mg Oral Q6H PRN Dahlia Byesnuoha, Josephine C, NP   500 mg at 03/16/22 [id number]   multivitamin with minerals tablet 1 tablet  1 tablet Oral Daily Sonny Dandyloyede, Juliet O, NP   1 tablet at 03/16/22 0801   nicotine (NICODERM CQ - dosed in mg/24 hours) patch 21 mg  21 mg Transdermal Daily Daneil Danloyede, Juliet O, NP   21 mg at 03/16/22 [id number]   rivaroxaban (XARELTO) tablet 20 mg  20 mg Oral Q supper Dahlia Byesnuoha, Josephine C, NP   20 mg at 03/15/22 1646   thiamine (VITAMIN B1) tablet 100 mg  100 mg Oral Daily Daneil Danloyede, Juliet O, NP   100 mg at 03/16/22 0800   traZODone (DESYREL) tablet 50 mg  50 mg Oral QHS PRN Earney Navynuoha, Josephine C, NP   50 mg at 03/15/22 2031    Lab Results:  Results for orders placed or performed during the hospital encounter of 03/10/22 (from the past 48 hour(s))  Comprehensive metabolic panel     Status: Abnormal   Collection Time: 03/14/22 11:45 AM  Result Value Ref Range   Sodium 138 135 - 145 mmol/L   Potassium 4.3 3.5 - 5.1 mmol/L   Chloride 107 98 - 111 mmol/L   CO2 26 22 - 32 mmol/L   Glucose, Bld 89 70 - 99 mg/dL    Comment: Glucose reference range applies only to samples taken after fasting  for at least 8 hours.   BUN 17 6 - 20 mg/dL   Creatinine, Ser 5.400.65 0.44 - 1.00 mg/dL   Calcium 9.1 8.9 - 98.110.3 mg/dL   Total Protein 6.5 6.5 - 8.1 g/dL   Albumin 3.6 3.5 - 5.0 g/dL   AST 14 (L) 15 - 41 U/L   ALT 15 0 - 44 U/L   Alkaline Phosphatase 71 38 - 126 U/L   Total Bilirubin 0.3 0.3 - 1.2 mg/dL   GFR, Estimated >19>60 >14>60 mL/min    Comment: (NOTE) Calculated using the CKD-EPI Creatinine Equation (2021)    Anion gap 5 5 - 15    Comment: Performed at [facility], [address] [place], [place] [address]  Lipase, blood     Status: None   Collection Time: 03/14/22 11:45 AM  Result Value Ref Range   Lipase 32 11 - 51 U/L    Comment: Performed at [facility], [address] [place], [place] [address]  CBC with Differential     Status: None   Collection Time: 03/14/22 11:45 AM  Result Value Ref Range   WBC 8.8 4.0 - 10.5 K/uL   RBC 4.37 3.87 - 5.11 MIL/uL   Hemoglobin 13.2 12.0 - 15.0 g/dL   HCT 86.539.9 78.436.0 - 69.646.0 %   MCV 91.3 80.0 - 100.0 fL   MCH 30.2 26.0 - 34.0 pg   MCHC 33.1 30.0 - 36.0 g/dL   RDW 29.512.4 28.411.5 - 13.215.5 %   Platelets 230 150 - 400 K/uL   nRBC 0.0 0.0 - 0.2 %   Neutrophils Relative % 52 %   Neutro Abs 4.7 1.7 - 7.7 K/uL   Lymphocytes Relative 36 %   Lymphs Abs 3.1 0.7 -  4.0 K/uL   Monocytes Relative 9 %   Monocytes Absolute 0.8 0.1 - 1.0 K/uL   Eosinophils Relative 2 %   Eosinophils Absolute 0.2 0.0 - 0.5 K/uL   Basophils Relative 0 %   Basophils Absolute 0.0 0.0 - 0.1 K/uL   Immature Granulocytes 1 %   Abs Immature Granulocytes 0.04 0.00 - 0.07 K/uL    Comment: Performed at [facility], [address], [place], [place] [address]  Urinalysis, Routine w reflex microscopic Urine, Clean Catch     Status: Abnormal   Collection Time: 03/14/22  1:18 PM  Result Value Ref Range   Color, Urine YELLOW YELLOW   APPearance HAZY (A) CLEAR   Specific Gravity, Urine 1.012 1.005 - 1.030   pH 6.0 5.0 - 8.0    Glucose, UA NEGATIVE NEGATIVE mg/dL   Hgb urine dipstick NEGATIVE NEGATIVE   Bilirubin Urine NEGATIVE NEGATIVE   Ketones, ur NEGATIVE NEGATIVE mg/dL   Protein, ur NEGATIVE NEGATIVE mg/dL   Nitrite NEGATIVE NEGATIVE   Leukocytes,Ua LARGE (A) NEGATIVE   RBC / HPF 0-5 0 - 5 RBC/hpf   WBC, UA 21-50 0 - 5 WBC/hpf   Bacteria, UA RARE (A) NONE SEEN   Squamous Epithelial / LPF 0-5 0 - 5    Comment: Performed at [facility], [address] [place], [place] [address]  Pregnancy, urine     Status: None   Collection Time: 03/14/22  1:18 PM  Result Value Ref Range   Preg Test, Ur NEGATIVE NEGATIVE    Comment:        THE SENSITIVITY OF THIS METHODOLOGY IS >20 mIU/mL. Performed at [facility], [address], [place], [place] [address]   Urine Culture     Status: Abnormal   Collection Time: 03/14/22  3:14 PM   Specimen: Urine, Clean Catch  Result Value Ref Range   Specimen Description      URINE, CLEAN CATCH Performed at [facility], [address] [place], [place] [address]    Special Requests      NONE Performed at [facility], [address], [place], [place] [address]    Culture MULTIPLE SPECIES PRESENT, SUGGEST RECOLLECTION (A)    Report Status 03/16/2022 FINAL     Blood Alcohol level:  Lab Results  Component Value Date   ETH 76 (H) 03/08/2022   ETH <10 01/18/2022    Metabolic Disorder Labs: Lab Results  Component Value Date   HGBA1C 5.1 03/13/2022   MPG 99.67 03/13/2022   No results found for: "PROLACTIN" Lab Results  Component Value Date   CHOL 194 03/13/2022   TRIG 53 03/13/2022   HDL 54 03/13/2022   CHOLHDL 3.6 03/13/2022   VLDL 11 03/13/2022   LDLCALC 129 (H) 03/13/2022    Physical Findings: AIMS:  , ,  ,  ,    CIWA:  CIWA-Ar Total: 0 COWS:     Musculoskeletal: Strength & Muscle Tone: within normal limits Gait & Station: normal Patient leans: N/A  Psychiatric Specialty  Exam:  Presentation  General Appearance: Appropriate for Environment; Casual; Fairly Groomed  Eye Contact:Good  Speech:Clear and Coherent; Normal Rate  Speech Volume:Normal  Handedness:Right  Mood and Affect  Mood:Anxious; Depressed  Affect:Appropriate; Congruent  Thought Process  Thought Processes:Linear  Descriptions of Associations:Intact  Orientation:Full (Time, Place and Person)  Thought Content:Logical  History of Schizophrenia/Schizoaffective disorder:No  Duration of Psychotic Symptoms:No data recorded Hallucinations:Hallucinations: Auditory Description of Command  Hallucinations: Patient hearing voices that are telling her things will be okay. Description of Auditory Hallucinations: Patient hearing voices that are telling her things will be okay. Description of Visual Hallucinations: denies  Ideas of Reference:None  Suicidal Thoughts:Suicidal Thoughts: No SI Active Intent and/or Plan: -- (n/a) SI Passive Intent and/or Plan: -- (n/a)  Homicidal Thoughts:Homicidal Thoughts: No  Sensorium  Memory:Immediate Good; Recent Good; Remote Good  Judgment:Fair  Insight:Fair  Executive Functions  Concentration:Good  Attention Span:Good  Recall:Fair  Fund of Knowledge:Fair  Language:Good  Psychomotor Activity  Psychomotor Activity:Psychomotor Activity: Normal  Assets  Assets:Communication Skills; Physical Health; Resilience  Sleep  Sleep:Sleep: Good Number of Hours of Sleep: 6  Physical Exam: Physical Exam Vitals and nursing note reviewed.  Constitutional:      Appearance: Normal appearance.  HENT:     Head: Normocephalic.     Right Ear: External ear normal.     Left Ear: External ear normal.     Nose: Nose normal.     Mouth/Throat:     Mouth: Mucous membranes are moist.     Pharynx: Oropharynx is clear.  Eyes:     Extraocular Movements: Extraocular movements intact.     Conjunctiva/sclera: Conjunctivae normal.     Pupils: Pupils are  equal, round, and reactive to light.  Cardiovascular:     Rate and Rhythm: Bradycardia present.     Comments: BP 104/70, P 55. Nursing staff to recheck. Pulmonary:     Effort: Pulmonary effort is normal.  Abdominal:     Palpations: Abdomen is soft.  Genitourinary:    Comments: deferred Musculoskeletal:        General: Normal range of motion.     Cervical back: Normal range of motion.  Skin:    General: Skin is warm.  Neurological:     General: No focal deficit present.     Mental Status: She is alert and oriented to person, place, and time.  Psychiatric:     Comments: Iritated    Review of Systems  Constitutional: Negative.  Negative for chills and fever.  HENT: Negative.  Negative for hearing loss and tinnitus.   Eyes: Negative.  Negative for blurred vision and double vision.  Respiratory: Negative.  Negative for cough, sputum production, shortness of breath and wheezing.   Cardiovascular:  Negative for chest pain and palpitations.       BP 104/70, P 55. Nursing staff to recheck.   Gastrointestinal: Negative.  Negative for abdominal pain, constipation, diarrhea, nausea and vomiting.  Genitourinary:  Negative for dysuria and frequency.  Musculoskeletal:  Positive for back pain, joint pain and myalgias.  Skin:  Negative for rash.  Neurological:  Negative for dizziness and headaches.  Endo/Heme/Allergies: Negative.  Negative for environmental allergies and polydipsia. Does not bruise/bleed easily.  Psychiatric/Behavioral:  Positive for depression, hallucinations and suicidal ideas. Negative for memory loss. The patient is nervous/anxious.    Blood pressure 104/70, pulse (!) 55, temperature 97.6 F (36.4 C), temperature source Oral, resp. rate 17, height 5\' 3"  (1.6 m), weight 53.1 kg, SpO2 100 %. Body mass index is 20.73 kg/m.   Treatment Plan Summary: Daily contact with patient to assess and evaluate symptoms and progress in treatment and Medication  management  Medications: Mood/anxiety: continue group therapy, milieu therapy, 1:1 evaluation with provider.  Abilify 10 mg PO daily antidepressant augment and mood stabilizer Lexapro 10mg  PO daily for depression and anxiety Hydroxyzine PRN anxiety UTI: Started on Keflex 500 mg p.o. 4 times daily x 5  days. Substance Abuse: brief intervention provided abstinence advised.  Continue ativan taper, thiamine replacement Rehab referral pending Abdominal/pelvic pain: sending to ER for further evaluation to rule out emergent condition, fracture, or DVT in pelvis KUB and pelvic XR reviewed.  Colace BID for constipation Ibuprofen ordered PRN cramping Gabapentin/lyrica not allowed at most rehab Medical: PRNs for pain, constipation, indigestion available.  Continue xarelto for history DVT and saddle embolus Safety and Monitoring: voluntary admission to inpatient psychiatric unit for safety, stabilization and treatment Daily contact with patient to assess and evaluate symptoms and progress in treatment Patient's case to be discussed in multi-disciplinary team meeting Observation Level : q15 minute checks Vital signs: q12 hours Precautions: suicide, withdrawal   6. Dispo: coordinating with SW for substance abuse placement. Patient has been tentatively accepted to [facility] for Tuesday.   Cecilie Lowers, FNP 03/16/2022, 11:38 AMPatient ID: Zoe Lan, female   DOB: 1966/07/30, 56 y.o.   MRN: [id number] Patient ID: Zoe Lan, female   DOB: July 22, 1966, 56 y.o.   MRN: [id number]

## 2022-03-16 NOTE — Progress Notes (Signed)
   03/16/22 0500  Sleep  Number of Hours 7.5    

## 2022-03-16 NOTE — [facility] Suicide Risk Assessment (Signed)
[facility] INPATIENT:  Family/Significant Other Suicide Prevention Education  Suicide Prevention Education:  Education Completed; sister Kristina Owens [phone] ,  (name of family member/significant other) has been identified by the patient as the family member/significant other with whom the patient will be residing, and identified as the person(s) who will aid the patient in the event of a mental health crisis (suicidal ideations/suicide attempt).    Sister does not know of any access to weapons.  She was made aware that the patient could possibly discharge as early as Wednesday.  Sister is very concerned about the possibility of patient being discharged to await a rehab bed, because she has no place to go and would have great difficulty staying sober.  Sister states the patient is still having pain in her side and does not know if the Colace is meant to help with that, needs an explanation.    With written consent from the patient, the family member/significant other has been provided the following suicide prevention education, prior to the and/or following the discharge of the patient.  The suicide prevention education provided includes the following: Suicide risk factors Suicide prevention and interventions National Suicide Hotline telephone number [facility] assessment telephone number [facility] Emergency Assistance 911 [facility] and/or Residential Mobile Crisis Unit telephone number  Request made of family/significant other to: Remove weapons (e.g., guns, rifles, knives), all items previously/currently identified as safety concern.   Remove drugs/medications (over-the-counter, prescriptions, illicit drugs), all items previously/currently identified as a safety concern.  The family member/significant other verbalizes understanding of the suicide prevention education information provided.  The family member/significant other agrees to remove the items of safety concern  listed above.  Kristina Owens 03/16/2022, 4:11 PM

## 2022-03-17 ENCOUNTER — Encounter (HOSPITAL_COMMUNITY): Payer: Self-pay

## 2022-03-17 MED ORDER — ESCITALOPRAM OXALATE 5 MG PO TABS
15.0000 mg | ORAL_TABLET | Freq: Every day | ORAL | Status: DC
  Filled 2022-03-17: qty 42

## 2022-03-17 MED ORDER — ARIPIPRAZOLE 15 MG PO TABS
15.0000 mg | ORAL_TABLET | Freq: Every day | ORAL | Status: DC
  Administered 2022-03-18 – 2022-03-20 (×3): 15 mg via ORAL
  Filled 2022-03-17 (×2): qty 14
  Filled 2022-03-17: qty 1
  Filled 2022-03-17 (×2): qty 14

## 2022-03-17 MED ORDER — GABAPENTIN 100 MG PO CAPS
100.0000 mg | ORAL_CAPSULE | Freq: Three times a day (TID) | ORAL | Status: DC
  Administered 2022-03-17 – 2022-03-19 (×7): 100 mg via ORAL
  Filled 2022-03-17: qty 1
  Filled 2022-03-17 (×2): qty 42
  Filled 2022-03-17: qty 1
  Filled 2022-03-17 (×2): qty 42
  Filled 2022-03-17 (×2): qty 1
  Filled 2022-03-17 (×2): qty 42

## 2022-03-17 MED ORDER — HYDROXYZINE HCL 50 MG PO TABS
50.0000 mg | ORAL_TABLET | Freq: Three times a day (TID) | ORAL | Status: DC | PRN
Start: 2022-03-17 — End: 2022-03-20
  Administered 2022-03-18: 50 mg via ORAL
  Filled 2022-03-17: qty 1
  Filled 2022-03-17: qty 20
  Filled 2022-03-17: qty 1

## 2022-03-17 NOTE — Plan of Care (Signed)
  Problem: Coping: Goal: Ability to verbalize frustrations and anger appropriately will improve Outcome: Progressing Goal: Ability to demonstrate self-control will improve Outcome: Progressing   Problem: Activity: Goal: Interest or engagement in activities will improve Outcome: Progressing Goal: Sleeping patterns will improve Outcome: Progressing   

## 2022-03-17 NOTE — Progress Notes (Signed)
Pt reports feeling the "same" as when she was first admitted. Pt said that she may feel "a little bit" better. She rates her anxiety and depression a 7-8 on a scale of 0-10 (10 being the worse). Pt said that she is still grieving the loss of her mother. She is interested in attending [name] or [facility]. Encouraged pt to discuss her interest for [facility] with her social worker in the morning. She does endorse AH of voices telling her negative things like "you're a bad person." Pt said that she copes with these AH by distracting herself by praying or reading her book. Pt denies that these AH are command in nature and tell her to hurt herself or anyone else. Pt denies SI/HI and VH. Active listening, reassurance, and support provided. Q 15 min safety checks continue. Pt's safety has been maintained.   03/16/22 2121  Psych Admission Type (Psych Patients Only)  Admission Status Voluntary  Psychosocial Assessment  Patient Complaints Anxiety;Depression  Eye Contact Fair  Facial Expression Anxious  Affect Anxious;Appropriate to circumstance  Speech Logical/coherent  Interaction Assertive  Motor Activity Slow  Appearance/Hygiene Unremarkable  Behavior Characteristics Cooperative;Appropriate to situation;Anxious  Mood Depressed;Anxious;Pleasant  Thought Process  Coherency WDL  Content WDL  Delusions None reported or observed  Perception Hallucinations  Hallucination Auditory  Judgment Poor  Confusion None  Danger to Self  Current suicidal ideation? Denies  Self-Injurious Behavior No self-injurious ideation or behavior indicators observed or expressed   Agreement Not to Harm Self Yes  Description of Agreement verbally contracts  Danger to Others  Danger to Others None reported or observed

## 2022-03-17 NOTE — Group Note (Signed)
LCSW Group Therapy Note   Group Date: 03/17/2022 Start Time: 1300 End Time: 1400    Type of Therapy and Topic:  Group Therapy: Anger Cues and Responses  Participation Level:  Active   Description of Group:   In this group, patients learned how to recognize the physical, cognitive, emotional, and behavioral responses they have to anger-provoking situations.  They identified a recent time they became angry and how they reacted.  They analyzed how their reaction was possibly beneficial and how it was possibly unhelpful.  The group discussed a variety of healthier coping skills that could help with such a situation in the future.  Focus was placed on how helpful it is to recognize the underlying emotions to our anger, because working on those can lead to a more permanent solution as well as our ability to focus on the important rather than the urgent.  Therapeutic Goals: Patients will remember their last incident of anger and how they felt emotionally and physically, what their thoughts were at the time, and how they behaved. Patients will identify how their behavior at that time worked for them, as well as how it worked against them. Patients will explore possible new behaviors to use in future anger situations. Patients will learn that anger itself is normal and cannot be eliminated, and that healthier reactions can assist with resolving conflict rather than worsening situations.  Summary of Patient Progress:  Pt was present/active throughout the session and proved open to feedback from CSW and peers. Patient demonstrated good insight into the subject matter, was respectful of peers, and was present and engaged throughout the entire session.  Therapeutic Modalities:   Cognitive Behavioral Therapy    Kathrynn Humble 03/17/2022  6:26 PM

## 2022-03-17 NOTE — Progress Notes (Signed)
   03/17/22 0800  Psych Admission Type (Psych Patients Only)  Admission Status Voluntary  Psychosocial Assessment  Patient Complaints Anxiety;Depression  Eye Contact Fair  Facial Expression Anxious  Affect Anxious;Appropriate to circumstance  Speech Logical/coherent  Interaction Assertive  Motor Activity Slow  Appearance/Hygiene Unremarkable  Behavior Characteristics Cooperative;Appropriate to situation  Mood Depressed;Anxious;Pleasant  Aggressive Behavior  Effect No apparent injury  Thought Process  Coherency WDL  Content WDL  Delusions None reported or observed  Perception WDL  Hallucination None reported or observed  Judgment Poor  Confusion None  Danger to Self  Current suicidal ideation? Denies  Self-Injurious Behavior No self-injurious ideation or behavior indicators observed or expressed   Agreement Not to Harm Self Yes  Description of Agreement Verbally contracts for safety  Danger to Others  Danger to Others None reported or observed

## 2022-03-17 NOTE — Progress Notes (Signed)
Spiritual care group on grief and loss facilitated by chaplain Burnis Kingfisher MDiv, B[facility]  Group Goal:  Support / Education around grief and loss Members engage in facilitated group support and psycho-social education.  Group Description:  Following introductions and group rules, group members engaged in facilitated group dialog and support around topic of loss, with particular support around experiences of loss in their lives. Group Identified types of loss (relationships / self / things) and identified patterns, circumstances, and changes that precipitate losses. Reflected on thoughts / feelings around loss, normalized grief responses, and recognized variety in grief experience.   Group noted Worden's four tasks of grief in discussion.  Group drew on Adlerian / Rogerian, narrative, MI, Patient Progress:  Kristina Owens was present throughout group.  Attentive to group conversation as evidenced by appropriate head nods and eye contact.  Did not engage in discussion.

## 2022-03-17 NOTE — Group Note (Signed)
Recreation Therapy Group Note   Group Topic:Stress Management  Group Date: 03/17/2022 Start Time: 0930 End Time: 0950 Facilitators: Caroll Rancher, [place] Location: [address] Dayroom  Goal Area(s) Addresses:  Patient will identify positive stress management techniques. Patient will identify benefits of using stress management post d/c.  Group Description:  Meditation.  LRT played a mountain meditation.  This meditation focused on taking on the characteristics of the mountain into meditation and everyday life.  The meditation centered on how mountains go through each season, they are able to take on those changes and remain steady through it.  The meditation brings that same steadiness into everyday living.     Affect/Mood: N/A   Participation Level: Did not attend    Clinical Observations/Individualized Feedback:     Plan: Continue to engage patient in RT group sessions 2-3x/week.   Caroll Rancher, LRT,CTRS 03/17/2022 10:48 AM

## 2022-03-17 NOTE — BH IP Treatment Plan (Signed)
Interdisciplinary Treatment and Diagnostic Plan Update  03/17/2022 Time of Session: 0830 [facility] MRN: [id number]  Principal Diagnosis: Major depressive disorder, recurrent episode, severe, with psychosis (HCC)  Secondary Diagnoses: Principal Problem:   Major depressive disorder, recurrent episode, severe, with psychosis (HCC) Active Problems:   GAD (generalized anxiety disorder)   Alcohol dependence (HCC)   Insomnia   Constipation   Current Medications:  Current Facility-Administered Medications  Medication Dose Route Frequency Provider Last Rate Last Admin   acetaminophen (TYLENOL) tablet 650 mg  650 mg Oral Q6H PRN Dahlia Byes C, NP   650 mg at 03/17/22 0620   alum & mag hydroxide-simeth (MAALOX/MYLANTA) 200-200-20 MG/5ML suspension 30 mL  30 mL Oral Q4H PRN Dahlia Byes C, NP       ARIPiprazole (ABILIFY) tablet 10 mg  10 mg Oral Daily Hill, Shelbie Hutching, MD   10 mg at 03/17/22 0756   benzocaine (ORAJEL) 10 % mucosal gel   Mouth/Throat BID PRN Cecilie Lowers, FNP   1 Application at 03/16/22 0809   cephALEXin (KEFLEX) capsule 500 mg  500 mg Oral Q6H Ntuen, Tina C, FNP   500 mg at 03/17/22 3818   docusate sodium (COLACE) capsule 100 mg  100 mg Oral BID Roselle Locus, MD   100 mg at 03/17/22 0756   escitalopram (LEXAPRO) tablet 15 mg  15 mg Oral Daily Ntuen, Tina C, FNP   15 mg at 03/17/22 0755   feeding supplement (ENSURE ENLIVE / ENSURE PLUS) liquid 237 mL  237 mL Oral BID BM Bobbitt, Shalon E, NP   237 mL at 03/17/22 0757   hydrOXYzine (ATARAX) tablet 25 mg  25 mg Oral TID PRN Dahlia Byes C, NP   25 mg at 03/16/22 1423   ibuprofen (ADVIL) tablet 600 mg  600 mg Oral Q6H PRN Roselle Locus, MD   600 mg at 03/16/22 1424   magnesium hydroxide (MILK OF MAGNESIA) suspension 30 mL  30 mL Oral Daily PRN Dahlia Byes C, NP       methocarbamol (ROBAXIN) tablet 500 mg  500 mg Oral Q6H PRN Dahlia Byes C, NP   500 mg at 03/17/22 2993    multivitamin with minerals tablet 1 tablet  1 tablet Oral Daily Daneil Dan O, NP   1 tablet at 03/17/22 0756   nicotine (NICODERM CQ - dosed in mg/24 hours) patch 21 mg  21 mg Transdermal Daily Daneil Dan O, NP   21 mg at 03/16/22 7169   rivaroxaban (XARELTO) tablet 20 mg  20 mg Oral Q supper Dahlia Byes C, NP   20 mg at 03/16/22 1731   thiamine (VITAMIN B1) tablet 100 mg  100 mg Oral Daily Daneil Dan O, NP   100 mg at 03/17/22 0756   traZODone (DESYREL) tablet 50 mg  50 mg Oral QHS PRN Dahlia Byes C, NP   50 mg at 03/16/22 2121   PTA Medications: Medications Prior to Admission  Medication Sig Dispense Refill Last Dose   doxycycline (VIBRAMYCIN) 100 MG capsule Take 1 capsule (100 mg total) by mouth 2 (two) times daily. (Patient not taking: Reported on 01/18/2022) 28 capsule 0    ibuprofen (ADVIL) 200 MG tablet Take 1,000-1,200 mg by mouth 2 (two) times daily as needed (pain). (Patient not taking: Reported on 03/08/2022)      methocarbamol (ROBAXIN) 500 MG tablet Take 1 tablet (500 mg total) by mouth every 8 (eight) hours as needed for muscle spasms. (Patient not taking: Reported on  03/08/2022) 30 tablet 0    metroNIDAZOLE (FLAGYL) 500 MG tablet Take 1 tablet (500 mg total) by mouth 2 (two) times daily. (Patient not taking: Reported on 01/18/2022) 28 tablet 0    ondansetron (ZOFRAN-ODT) 4 MG disintegrating tablet Take 1 tablet (4 mg total) by mouth every 8 (eight) hours as needed for nausea or vomiting. (Patient not taking: Reported on 03/08/2022) 20 tablet 0    RIVAROXABAN (XARELTO) VTE STARTER PACK (15 & 20 MG) Follow package directions: Take one 15mg  tablet by mouth twice a day. On day 22, switch to one 20mg  tablet once a day. Take with food. (Patient not taking: Reported on 03/08/2022) 51 each 0     Patient Stressors: Financial difficulties   Health problems   Medication change or noncompliance   Substance abuse   Traumatic event    Patient Strengths: Average or above  average intelligence  Communication skills   Treatment Modalities: Medication Management, Group therapy, Case management,  1 to 1 session with clinician, Psychoeducation, Recreational therapy.   Physician Treatment Plan for Primary Diagnosis: Major depressive disorder, recurrent episode, severe, with psychosis (HCC) Long Term Goal(s): Improvement in symptoms so as ready for discharge   Short Term Goals:    Medication Management: Evaluate patient's response, side effects, and tolerance of medication regimen.  Therapeutic Interventions: 1 to 1 sessions, Unit Group sessions and Medication administration.  Evaluation of Outcomes: Progressing  Physician Treatment Plan for Secondary Diagnosis: Principal Problem:   Major depressive disorder, recurrent episode, severe, with psychosis (HCC) Active Problems:   GAD (generalized anxiety disorder)   Alcohol dependence (HCC)   Insomnia   Constipation  Long Term Goal(s): Improvement in symptoms so as ready for discharge   Short Term Goals:       Medication Management: Evaluate patient's response, side effects, and tolerance of medication regimen.  Therapeutic Interventions: 1 to 1 sessions, Unit Group sessions and Medication administration.  Evaluation of Outcomes: Progressing   RN Treatment Plan for Primary Diagnosis: Major depressive disorder, recurrent episode, severe, with psychosis (HCC) Long Term Goal(s): Knowledge of disease and therapeutic regimen to maintain health will improve  Short Term Goals: Ability to remain free from injury will improve, Ability to verbalize frustration and anger appropriately will improve, Ability to demonstrate self-control, Ability to participate in decision making will improve, Ability to verbalize feelings will improve, Ability to disclose and discuss suicidal ideas, Ability to identify and develop effective coping behaviors will improve, and Compliance with prescribed medications will  improve  Medication Management: RN will administer medications as ordered by provider, will assess and evaluate patient's response and provide education to patient for prescribed medication. RN will report any adverse and/or side effects to prescribing provider.  Therapeutic Interventions: 1 on 1 counseling sessions, Psychoeducation, Medication administration, Evaluate responses to treatment, Monitor vital signs and CBGs as ordered, Perform/monitor CIWA, COWS, AIMS and Fall Risk screenings as ordered, Perform wound care treatments as ordered.  Evaluation of Outcomes: Progressing   LCSW Treatment Plan for Primary Diagnosis: Major depressive disorder, recurrent episode, severe, with psychosis (HCC) Long Term Goal(s): Safe transition to appropriate next level of care at discharge, Engage patient in therapeutic group addressing interpersonal concerns.  Short Term Goals: Engage patient in aftercare planning with referrals and resources, Increase social support, Increase ability to appropriately verbalize feelings, Increase emotional regulation, Facilitate acceptance of mental health diagnosis and concerns, Facilitate patient progression through stages of change regarding substance use diagnoses and concerns, Identify triggers associated with mental health/substance abuse issues,  and Increase skills for wellness and recovery  Therapeutic Interventions: Assess for all discharge needs, 1 to 1 time with Social worker, Explore available resources and support systems, Assess for adequacy in community support network, Educate family and significant other(s) on suicide prevention, Complete Psychosocial Assessment, Interpersonal group therapy.  Evaluation of Outcomes: Progressing   Progress in Treatment: Attending groups: Yes. Participating in groups: Yes. Taking medication as prescribed: Yes. Toleration medication: Yes. Family/Significant other contact made: Yes, individual(s) contacted:  [name],  sister,  Patient understands diagnosis: Yes. Discussing patient identified problems/goals with staff: Yes. Medical problems stabilized or resolved: Yes. Denies suicidal/homicidal ideation: Yes. Issues/concerns per patient self-inventory: Yes. Other: none  New problem(s) identified: No, Describe:  none  New Short Term/Long Term Goal(s): Patient to work towards detox, medication management for mood stabilization; elimination of SI thoughts; development of comprehensive mental wellness/sobriety plan.  Patient Goals:  Patient states their goal for treatment is to "get into drug and alcohol treatment."   Discharge Plan or Barriers: [facility]   Reason for Continuation of Hospitalization: Depression Other; describe active alcohol use  Estimated Length of Stay: 1 day   Last PHQ 2/9 Scores:    03/12/2021    4:35 PM  Depression screen PHQ 2/9  Decreased Interest 2  Down, Depressed, Hopeless 3  PHQ - 2 Score 5  Altered sleeping 3  Tired, decreased energy 3  Change in appetite 3  Feeling bad or failure about yourself  3  Trouble concentrating 3  Moving slowly or fidgety/restless 2  Suicidal thoughts 0  PHQ-9 Score 22    Scribe for Treatment Team: Almedia Balls 03/17/2022 11:13 AM

## 2022-03-17 NOTE — [facility] Group Notes (Signed)
[facility] Group Notes:  (Nursing/MHT/Case Management/Adjunct)  Date:  03/17/2022  Time:  9:37 AM  Type of Therapy:  Group Therapy   Participation Level:  Did Not Attend   Summary of Progress/Problems:   Patient did not attend goals/ orientation group today. Patient was encouraged to go but refused.   Donna Snooks R Esiquio Boesen 03/17/2022, 9:37 AM 

## 2022-03-17 NOTE — Progress Notes (Signed)
[facility] MD Progress Note  03/17/2022 3:26 PM [place]  MRN:  [id number]  Subjective:  The patient is a 56 years old Caucasian who presents to the hospital for suicidal ideation, increased Depression with Psychosis.  The patient states "look I have a lot of mental health issues, physical health problems, I'm homeless, I started hearing voices, I'm an alcoholic, my mind is not right and I wish someone will pump some overdose drugs through through IV and will leave this world. I need help." States she is okay if she sleeps and never wake up.  The patient is being treated for the following psychiatric diagnoses: MDD, severe, recurrent, with psychosis GAD Alcohol use disorder Insomnia  Yesterday, the psychiatry team made the following recommendations: Continue Abilify 10 mg once daily Increase Lexapro to 15 mg once daily Continue Keflex for UTI  On my evaluation today, the patient reports that her mood is still down depressed sad and dysphoric.  She reports mood has had some improvement since admission, but not much.  She reports that anxiety continues to "be up and down" secondary to the loss of her mother.  She reports that Vistaril is somewhat helpful but would like to address constant high level of anxiety.  She states that sleep is poor due to having nightmares, which are chronic but worsening recently.  Patient reports she continues to have auditory hallucinations "I am a bad person" but denies CAH.  Denies VH.  Denies SI.  Denies HI.  We discussed with the patient's low blood pressure, clonidine and prazosin and options for treating nightmares.  We will start gabapentin for this, in addition to back pain.  Patient is also agreeable to increasing Abilify for mood and anxiety.  Lexapro was increased to 15 mg yesterday, will monitor response for mood and anxiety to that medication change.  We will also increase as needed hydroxyzine, patient request for treating exacerbations of anxiety, and  patient is aware of sedation and other side effects such as dry mouth and constipation can be a side effect of higher dose of as needed hydroxyzine.  She otherwise denies having any side effects to current medications.  She is on Keflex for UTI.  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   Principal Problem: Major depressive disorder, recurrent episode, severe, with psychosis (HCC) Diagnosis: Principal Problem:   Major depressive disorder, recurrent episode, severe, with psychosis (HCC) Active Problems:   GAD (generalized anxiety disorder)   Alcohol dependence (HCC)   Insomnia   Constipation  Total Time spent with patient: 15 minutes  Past Psychiatric History: Per H&P: MDD, PTSD, OCD (check on things repeatedly) & ADHD   Past Medical History:  Past Medical History:  Diagnosis Date   Anxiety    Depression    DVT (deep vein thrombosis) in pregnancy    GERD (gastroesophageal reflux disease)    Ovarian cyst     Past Surgical History:  Procedure Laterality Date   APPENDECTOMY  age 80   IR PTA VENOUS EXCEPT DIALYSIS CIRCUIT  01/16/2021   IR RADIOLOGIST EVAL & MGMT  03/19/2021   IR THROMBECT VENO MECH MOD SED  01/16/2021   IR US GUIDE VASC ACCESS LEFT  01/16/2021   IR VENO/EXT/UNI LEFT  01/16/2021   IR VENOCAVAGRAM IVC  01/16/2021   TONSILLECTOMY Bilateral age 58   Family History:  Family History  Problem Relation Age of Onset   Asthma Mother    COPD Mother    Cancer Father  thyroid cancer   Family Psychiatric  History: See H&P  Social History:  Social History   Substance and Sexual Activity  Alcohol Use Yes     Social History   Substance and Sexual Activity  Drug Use Yes   Types: "Crack" cocaine, Cocaine, Methamphetamines   Comment: last used 4/31/22    Social History   Socioeconomic History   Marital status: Divorced    Spouse name: Not on file   Number of children: Not on file   Years of education: Not on file   Highest education level: Not on file   Occupational History   Not on file  Tobacco Use   Smoking status: Every Day    Packs/day: 0.50    Years: 35.00    Total pack years: 17.50    Types: Cigarettes   Smokeless tobacco: Never  Vaping Use   Vaping Use: Never used  Substance and Sexual Activity   Alcohol use: Yes   Drug use: Yes    Types: "Crack" cocaine, Cocaine, Methamphetamines    Comment: last used 4/31/22   Sexual activity: Not on file  Other Topics Concern   Not on file  Social History Narrative   Not on file   Social Determinants of Health   Financial Resource Strain: Not on file  Food Insecurity: Not on file  Transportation Needs: Not on file  Physical Activity: Not on file  Stress: Not on file  Social Connections: Not on file   Additional Social History:                         Sleep: Poor  Appetite:  Fair  Current Medications: Current Facility-Administered Medications  Medication Dose Route Frequency Provider Last Rate Last Admin   acetaminophen (TYLENOL) tablet 650 mg  650 mg Oral Q6H PRN Dahlia Byes C, NP   650 mg at 03/17/22 0620   alum & mag hydroxide-simeth (MAALOX/MYLANTA) 200-200-20 MG/5ML suspension 30 mL  30 mL Oral Q4H PRN Earney Navy, NP       [START ON 03/18/2022] ARIPiprazole (ABILIFY) tablet 15 mg  15 mg Oral Daily Amarionna Arca, MD       benzocaine (ORAJEL) 10 % mucosal gel   Mouth/Throat BID PRN Cecilie Lowers, FNP   Given at 03/17/22 1154   cephALEXin (KEFLEX) capsule 500 mg  500 mg Oral Q6H Ntuen, Tina C, FNP   500 mg at 03/17/22 1150   docusate sodium (COLACE) capsule 100 mg  100 mg Oral BID Roselle Locus, MD   100 mg at 03/17/22 0756   escitalopram (LEXAPRO) tablet 15 mg  15 mg Oral Daily Ntuen, Tina C, FNP   15 mg at 03/17/22 0755   feeding supplement (ENSURE ENLIVE / ENSURE PLUS) liquid 237 mL  237 mL Oral BID BM Bobbitt, Shalon E, NP   237 mL at 03/17/22 0757   gabapentin (NEURONTIN) capsule 100 mg  100 mg Oral TID Phineas Inches, MD    100 mg at 03/17/22 1403   hydrOXYzine (ATARAX) tablet 50 mg  50 mg Oral TID PRN Kyree Fedorko, Harrold Donath, MD       ibuprofen (ADVIL) tablet 600 mg  600 mg Oral Q6H PRN Roselle Locus, MD   600 mg at 03/17/22 1152   magnesium hydroxide (MILK OF MAGNESIA) suspension 30 mL  30 mL Oral Daily PRN Dahlia Byes C, NP       methocarbamol (ROBAXIN) tablet 500 mg  500 mg Oral  Q6H PRN Dahlia Byes C, NP   500 mg at 03/17/22 3419   multivitamin with minerals tablet 1 tablet  1 tablet Oral Daily Daneil Dan O, NP   1 tablet at 03/17/22 0756   nicotine (NICODERM CQ - dosed in mg/24 hours) patch 21 mg  21 mg Transdermal Daily Daneil Dan O, NP   21 mg at 03/16/22 3790   rivaroxaban (XARELTO) tablet 20 mg  20 mg Oral Q supper Dahlia Byes C, NP   20 mg at 03/16/22 1731   thiamine (VITAMIN B1) tablet 100 mg  100 mg Oral Daily Daneil Dan O, NP   100 mg at 03/17/22 0756   traZODone (DESYREL) tablet 50 mg  50 mg Oral QHS PRN Dahlia Byes C, NP   50 mg at 03/16/22 2121    Lab Results: No results found for this or any previous visit (from the past 48 hour(s)).  Blood Alcohol level:  Lab Results  Component Value Date   ETH 76 (H) 03/08/2022   ETH <10 01/18/2022    Metabolic Disorder Labs: Lab Results  Component Value Date   HGBA1C 5.1 03/13/2022   MPG 99.67 03/13/2022   No results found for: "PROLACTIN" Lab Results  Component Value Date   CHOL 194 03/13/2022   TRIG 53 03/13/2022   HDL 54 03/13/2022   CHOLHDL 3.6 03/13/2022   VLDL 11 03/13/2022   LDLCALC 129 (H) 03/13/2022    Physical Findings: AIMS: Facial and Oral Movements Muscles of Facial Expression: None, normal Lips and Perioral Area: None, normal Jaw: None, normal Tongue: None, normal,Extremity Movements Upper (arms, wrists, hands, fingers): None, normal Lower (legs, knees, ankles, toes): None, normal, Trunk Movements Neck, shoulders, hips: None, normal, Overall Severity Severity of abnormal movements  (highest score from questions above): None, normal Incapacitation due to abnormal movements: None, normal Patient's awareness of abnormal movements (rate only patient's report): No Awareness, Dental Status Current problems with teeth and/or dentures?: No Does patient usually wear dentures?: No  CIWA:  CIWA-Ar Total: 1 COWS:     Musculoskeletal: Strength & Muscle Tone: within normal limits Gait & Station: normal Patient leans: N/A  Psychiatric Specialty Exam:  Presentation  General Appearance: Casual  Eye Contact:Fair  Speech:Slow  Speech Volume:Decreased  Handedness:Right   Mood and Affect  Mood:Anxious; Depressed; Dysphoric  Affect:Congruent; Restricted   Thought Process  Thought Processes:Linear  Descriptions of Associations:Intact  Orientation:Full (Time, Place and Person)  Thought Content:Logical  History of Schizophrenia/Schizoaffective disorder:No  Duration of Psychotic Symptoms:No data recorded Hallucinations:Hallucinations: Auditory Description of Command Hallucinations: Patient hearing voices that are telling her things will be okay. Description of Auditory Hallucinations: "i'm a bad person" Description of Visual Hallucinations: denies  Ideas of Reference:None  Suicidal Thoughts:Suicidal Thoughts: No SI Active Intent and/or Plan: -- (n/a) SI Passive Intent and/or Plan: -- (n/a)  Homicidal Thoughts:Homicidal Thoughts: No   Sensorium  Memory:Immediate Good; Recent Good; Remote Good  Judgment:Fair  Insight:Fair   Executive Functions  Concentration:Fair  Attention Span:Fair  Recall:Fair  Fund of Knowledge:Fair  Language:Good   Psychomotor Activity  Psychomotor Activity:Psychomotor Activity: Normal   Assets  Assets:Communication Skills; Physical Health; Resilience   Sleep  Sleep:Sleep: Poor Number of Hours of Sleep: 6    Physical Exam: Physical Exam Vitals reviewed.  Pulmonary:     Effort: Pulmonary effort is normal.   Neurological:     Mental Status: She is alert.     Motor: No weakness.     Gait: Gait normal.  Review of Systems  Psychiatric/Behavioral:  Positive for depression, hallucinations and substance abuse. The patient is nervous/anxious and has insomnia.    Blood pressure 98/81, pulse 62, temperature 98.2 F (36.8 C), temperature source Oral, resp. rate 19, height 5\' 3"  (1.6 m), weight 53.1 kg, SpO2 99 %. Body mass index is 20.73 kg/m.   Treatment Plan Summary: Daily contact with patient to assess and evaluate symptoms and progress in treatment  Assessment: MDD severe recurrent with psychosis GAD Insomnia Alcohol use disorder  Plan: -Increase Abilify to 15 mg once daily for MDD with psychosis -Continue Lexapro 15 mg once daily for MDD -Start gabapentin 100 mg 3 times daily for restless legs, neuropathic pain, and alcohol use disorder -Increase hydroxyzine from 25 to 50 mg as needed for anxiety -Continue trazodone 50 mg nightly just as needed for mood and insomnia  Previously discontinued Zoloft Previously completed Ativan taper per detox protocol  Safety and Monitoring: Voluntary admission to inpatient psychiatric unit for safety, stabilization and treatment Daily contact with patient to assess and evaluate symptoms and progress in treatment Patient's case to be discussed in multi-disciplinary team meeting Observation Level : q15 minute checks Vital signs: q12 hours Precautions: suicide     Discharge Planning: Social work and case management to assist with discharge planning and identification of hospital follow-up needs prior to discharge Estimated LOS: 5-7 days Discharge Concerns: Need to establish a safety plan; Medication compliance and effectiveness Discharge Goals: Rehab/outpatient referrals for mental health follow-up including medication management/psychotherapy  [facility], in  order to make medication changes due to patient's delayed response to psychiatric medication changes since admission    Monday, MD 03/17/2022, 3:26 PM   Total Time Spent in Direct Patient Care:  I personally spent 35 minutes on the unit in direct patient care. The direct patient care time included face-to-face time with the patient, reviewing the patient's chart, communicating with other professionals, and coordinating care. Greater than 50% of this time was spent in counseling or coordinating care with the patient regarding goals of hospitalization, psycho-education, and discharge planning needs.   03/19/2022, MD Psychiatrist

## 2022-03-18 NOTE — Progress Notes (Addendum)
[facility] MD Progress Note  03/18/2022 12:56 PM Kristina Owens  MRN:  [id number]  Subjective:  The patient is a 56 years old Caucasian who presents to the hospital for suicidal ideation, increased Depression with Psychosis.  The patient states "look I have a lot of mental health issues, physical health problems, I'm homeless, I started hearing voices, I'm an alcoholic, my mind is not right and I wish someone will pump some overdose drugs through through IV and will leave this world. I need help." States she is okay if she sleeps and never wake up.  The patient is being treated for the following psychiatric diagnoses: MDD, severe, recurrent, with psychosis GAD Alcohol use disorder Insomnia  Yesterday, the psychiatry team made the following recommendations: -Increase Abilify to 50 mg once daily for MDD with psychosis -Continue Lexapro 15 mg once daily for MDD -Start gabapentin 100 mg 3 times daily for restless legs, neuropathic pain, and alcohol use disorder -Increase hydroxyzine from 25 to 50 mg as needed for anxiety -Continue trazodone 50 mg nightly just as needed for mood and insomnia  On my evaluation today, the patient reports that her mood is less depressed.  She reports that medication adjustments have been helpful for her mood and also anxiety.  Reports anxiety continues to be up and down, but less bothersome.  She reports that increased dose of hydroxyzine is helpful but causes sedation, but wishes to continue at the higher dose for now.  She reports that sleep is better after starting gabapentin.  Reports having one nightmare last night which was less bothersome, which is an improvement.  She reports that back pain is also less after starting this.  Reports appetite is okay.  She reports that she continues to have auditory hallucinations that are mood congruent, but denies CAH.  Denies VH.  Denies other psychotic symptoms.  Denies SI.  Denies HI.  She otherwise denies having any side  effects to current medications.  She is on Keflex for UTI.  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   Principal Problem: Major depressive disorder, recurrent episode, severe, with psychosis (HCC) Diagnosis: Principal Problem:   Major depressive disorder, recurrent episode, severe, with psychosis (HCC) Active Problems:   GAD (generalized anxiety disorder)   Alcohol dependence (HCC)   Insomnia   Constipation  Total Time spent with patient: 15 minutes  Past Psychiatric History: Per H&P: MDD, PTSD, OCD (check on things repeatedly) & ADHD   Past Medical History:  Past Medical History:  Diagnosis Date   Anxiety    Depression    DVT (deep vein thrombosis) in pregnancy    GERD (gastroesophageal reflux disease)    Ovarian cyst     Past Surgical History:  Procedure Laterality Date   APPENDECTOMY  age 38   IR PTA VENOUS EXCEPT DIALYSIS CIRCUIT  01/16/2021   IR RADIOLOGIST EVAL & MGMT  03/19/2021   IR THROMBECT VENO MECH MOD SED  01/16/2021   IR US GUIDE VASC ACCESS LEFT  01/16/2021   IR VENO/EXT/UNI LEFT  01/16/2021   IR VENOCAVAGRAM IVC  01/16/2021   TONSILLECTOMY Bilateral age 58   Family History:  Family History  Problem Relation Age of Onset   Asthma Mother    COPD Mother    Cancer Father        thyroid cancer   Family Psychiatric  History: See H&P  Social History:  Social History   Substance and Sexual Activity  Alcohol Use Yes  Social History   Substance and Sexual Activity  Drug Use Yes   Types: "Crack" cocaine, Cocaine, Methamphetamines   Comment: last used 4/31/22    Social History   Socioeconomic History   Marital status: Divorced    Spouse name: Not on file   Number of children: Not on file   Years of education: Not on file   Highest education level: Not on file  Occupational History   Not on file  Tobacco Use   Smoking status: Every Day    Packs/day: 0.50    Years: 35.00    Total pack years: 17.50    Types: Cigarettes   Smokeless  tobacco: Never  Vaping Use   Vaping Use: Never used  Substance and Sexual Activity   Alcohol use: Yes   Drug use: Yes    Types: "Crack" cocaine, Cocaine, Methamphetamines    Comment: last used 4/31/22   Sexual activity: Not on file  Other Topics Concern   Not on file  Social History Narrative   Not on file   Social Determinants of Health   Financial Resource Strain: Not on file  Food Insecurity: Not on file  Transportation Needs: Not on file  Physical Activity: Not on file  Stress: Not on file  Social Connections: Not on file   Additional Social History:                         Sleep: Better  Appetite:  Fair  Current Medications: Current Facility-Administered Medications  Medication Dose Route Frequency Provider Last Rate Last Admin   acetaminophen (TYLENOL) tablet 650 mg  650 mg Oral Q6H PRN Dahlia Byes C, NP   650 mg at 03/17/22 0620   alum & mag hydroxide-simeth (MAALOX/MYLANTA) 200-200-20 MG/5ML suspension 30 mL  30 mL Oral Q4H PRN Dahlia Byes C, NP       ARIPiprazole (ABILIFY) tablet 15 mg  15 mg Oral Daily Krissa Utke, MD   15 mg at 03/18/22 0806   benzocaine (ORAJEL) 10 % mucosal gel   Mouth/Throat BID PRN Cecilie Lowers, FNP   Given at 03/17/22 1154   cephALEXin (KEFLEX) capsule 500 mg  500 mg Oral Q6H Ntuen, Tina C, FNP   500 mg at 03/18/22 0610   docusate sodium (COLACE) capsule 100 mg  100 mg Oral BID Roselle Locus, MD   100 mg at 03/17/22 1617   escitalopram (LEXAPRO) tablet 15 mg  15 mg Oral Daily Ntuen, Tina C, FNP   15 mg at 03/18/22 0806   feeding supplement (ENSURE ENLIVE / ENSURE PLUS) liquid 237 mL  237 mL Oral BID BM Bobbitt, Shalon E, NP   237 mL at 03/18/22 1111   gabapentin (NEURONTIN) capsule 100 mg  100 mg Oral TID Phineas Inches, MD   100 mg at 03/18/22 0806   hydrOXYzine (ATARAX) tablet 50 mg  50 mg Oral TID PRN Phineas Inches, MD   50 mg at 03/18/22 0812   ibuprofen (ADVIL) tablet 600 mg  600 mg Oral  Q6H PRN Roselle Locus, MD   600 mg at 03/18/22 0610   magnesium hydroxide (MILK OF MAGNESIA) suspension 30 mL  30 mL Oral Daily PRN Dahlia Byes C, NP       methocarbamol (ROBAXIN) tablet 500 mg  500 mg Oral Q6H PRN Dahlia Byes C, NP   500 mg at 03/18/22 3474   multivitamin with minerals tablet 1 tablet  1 tablet Oral Daily Oloyede,  Derenda Fennel, NP   1 tablet at 03/18/22 0806   nicotine (NICODERM CQ - dosed in mg/24 hours) patch 21 mg  21 mg Transdermal Daily Daneil Dan O, NP   21 mg at 03/18/22 0370   rivaroxaban (XARELTO) tablet 20 mg  20 mg Oral Q supper Dahlia Byes C, NP   20 mg at 03/17/22 1618   thiamine (VITAMIN B1) tablet 100 mg  100 mg Oral Daily Daneil Dan O, NP   100 mg at 03/18/22 0806   traZODone (DESYREL) tablet 50 mg  50 mg Oral QHS PRN Dahlia Byes C, NP   50 mg at 03/17/22 2053    Lab Results: No results found for this or any previous visit (from the past 48 hour(s)).  Blood Alcohol level:  Lab Results  Component Value Date   ETH 76 (H) 03/08/2022   ETH <10 01/18/2022    Metabolic Disorder Labs: Lab Results  Component Value Date   HGBA1C 5.1 03/13/2022   MPG 99.67 03/13/2022   No results found for: "PROLACTIN" Lab Results  Component Value Date   CHOL 194 03/13/2022   TRIG 53 03/13/2022   HDL 54 03/13/2022   CHOLHDL 3.6 03/13/2022   VLDL 11 03/13/2022   LDLCALC 129 (H) 03/13/2022    Physical Findings: AIMS: Facial and Oral Movements Muscles of Facial Expression: None, normal Lips and Perioral Area: None, normal Jaw: None, normal Tongue: None, normal,Extremity Movements Upper (arms, wrists, hands, fingers): None, normal Lower (legs, knees, ankles, toes): None, normal, Trunk Movements Neck, shoulders, hips: None, normal, Overall Severity Severity of abnormal movements (highest score from questions above): None, normal Incapacitation due to abnormal movements: None, normal Patient's awareness of abnormal movements (rate  only patient's report): No Awareness, Dental Status Current problems with teeth and/or dentures?: No Does patient usually wear dentures?: No  CIWA:  CIWA-Ar Total: 2 COWS:     Musculoskeletal: Strength & Muscle Tone: within normal limits Gait & Station: normal Patient leans: N/A  Psychiatric Specialty Exam:  Presentation  General Appearance: Casual  Eye Contact:Fair  Speech: Normal rate  Speech Volume: Normal  Handedness:Right   Mood and Affect  Mood: Less depressed  Affect:Congruent; Restricted   Thought Process  Thought Processes:Linear  Descriptions of Associations:Intact  Orientation:Full (Time, Place and Person)  Thought Content:Logical  History of Schizophrenia/Schizoaffective disorder:No  Duration of Psychotic Symptoms:No data recorded Hallucinations:Hallucinations: Auditory Description of Auditory Hallucinations: "i'm a bad person"  Ideas of Reference:None  Suicidal Thoughts:Suicidal Thoughts: No  Homicidal Thoughts:Homicidal Thoughts: No   Sensorium  Memory:Immediate Good; Recent Good; Remote Good  Judgment:Fair  Insight:Fair   Executive Functions  Concentration:Fair  Attention Span:Fair  Recall:Fair  Fund of Knowledge:Fair  Language:Good   Psychomotor Activity  Psychomotor Activity:Psychomotor Activity: Normal   Assets  Assets:Communication Skills; Physical Health; Resilience   Sleep  Sleep:Sleep: Poor    Physical Exam: Physical Exam Vitals reviewed.  Pulmonary:     Effort: Pulmonary effort is normal.  Neurological:     Mental Status: She is alert.     Motor: No weakness.     Gait: Gait normal.    Review of Systems  Psychiatric/Behavioral:  Positive for depression, hallucinations and substance abuse. The patient is nervous/anxious and has insomnia.    Blood pressure 108/80, pulse 62, temperature 98.8 F (37.1 C), temperature source Oral, resp. rate 17, height 5\' 3"  (1.6 m), weight 53.1 kg, SpO2 100 %. Body  mass index is 20.73 kg/m.   Treatment Plan Summary: Daily contact with  patient to assess and evaluate symptoms and progress in treatment  Assessment: MDD severe recurrent with psychosis GAD Insomnia Alcohol use disorder  Plan: -Continue Abilify 15 mg once daily for MDD with psychosis -Continue Lexapro 15 mg once daily for MDD -Continue gabapentin 100 mg 3 times daily for restless legs, neuropathic pain, and alcohol use disorder -Continue hydroxyzine 50 mg as needed for anxiety -Continue trazodone 50 mg nightly just as needed for mood and insomnia  Previously discontinued Zoloft Previously completed Ativan taper per detox protocol  Safety and Monitoring: Voluntary admission to inpatient psychiatric unit for safety, stabilization and treatment Daily contact with patient to assess and evaluate symptoms and progress in treatment Patient's case to be discussed in multi-disciplinary team meeting Observation Level : q15 minute checks Vital signs: q12 hours Precautions: suicide     Discharge Planning: Social work and case management to assist with discharge planning and identification of hospital follow-up needs prior to discharge Estimated LOS: 5-7 days Discharge Concerns: Need to establish a safety plan; Medication compliance and effectiveness Discharge Goals: Rehab/outpatient referrals for mental health follow-up including medication management/psychotherapy   [facility] (accepted for Thursday) for residential substance use treatment.   Christoper Allegra, MD 03/18/2022, 12:56 PM   Total Time Spent in Direct Patient Care:  I personally spent 30 minutes on the unit in direct patient care. The direct patient care time included face-to-face time with the patient, reviewing the patient's chart, communicating with other professionals, and coordinating care. Greater than 50% of this time was spent in counseling or coordinating care with the  patient regarding goals of hospitalization, psycho-education, and discharge planning needs.   Janine Limbo, MD Psychiatrist

## 2022-03-18 NOTE — Group Note (Signed)
Date:  03/18/2022 Time:  10:27 AM  Group Topic/Focus:  Orientation:   The focus of this group is to educate the patient on the purpose and policies of crisis stabilization and provide a format to answer questions about their admission.  The group details unit policies and expectations of patients while admitted.    Participation Level:  Active  Participation Quality:  Appropriate  Affect:  Anxious and Appropriate  Cognitive:  Appropriate  Insight: Appropriate  Engagement in Group:  Engaged  Modes of Intervention:  Discussion  Additional Comments:     Reymundo Poll 03/18/2022, 10:27 AM

## 2022-03-18 NOTE — Progress Notes (Addendum)
Upon assessment, pt was resting in bed. Pt reports that she still feels depressed from grieving the loss of her mother. Pt has low energy levels and remained resting in bed. She didn't attend group despite encouragement that AA was here. Pt is looking forward to hopefully getting a bed at [facility] on Thursday. Pt said that she did attend group earlier during the day about grief. Offered pt to speak to a chaplain, but she is not interested at this time. She has been writing in her journal as one of her coping skills. Reports a fair appetite and eating at least 50% of her meals. Reports that her gabapentin has been working well for her pain so far. Denies any side effects from her medications. Pt denies SI/HI and AVH. Active listening, reassurance, and support provided. Q 15 min safety checks continue. Pt's safety has been maintained.   03/17/22 2053  Psych Admission Type (Psych Patients Only)  Admission Status Voluntary  Psychosocial Assessment  Patient Complaints Depression;Sadness  Eye Contact Fair  Facial Expression Anxious  Affect Anxious;Appropriate to circumstance;Depressed  Speech Logical/coherent  Interaction Assertive  Motor Activity Slow  Appearance/Hygiene Unremarkable  Behavior Characteristics Cooperative;Appropriate to situation;Anxious  Mood Depressed;Anxious;Sad;Pleasant  Thought Process  Coherency WDL  Content WDL  Delusions None reported or observed  Perception WDL  Hallucination None reported or observed  Judgment Limited  Confusion None  Danger to Self  Current suicidal ideation? Denies  Self-Injurious Behavior No self-injurious ideation or behavior indicators observed or expressed   Agreement Not to Harm Self Yes  Description of Agreement verbally contracts for safety  Danger to Others  Danger to Others None reported or observed

## 2022-03-18 NOTE — Progress Notes (Signed)
Pt continues to complain of back pain and takes PRN pain medications with some reduction in pain.  Pt is pleasant and cooperative.  Pt says her mood has improved and she is less anxious than she was when she was admitted.  Pt took medications without incident and no adverse side effects are noted. RN will continue to monitor pt's progress and provide support as indicated.  03/18/22 1300  Psych Admission Type (Psych Patients Only)  Admission Status Voluntary  Psychosocial Assessment  Patient Complaints Depression  Eye Contact Fair  Facial Expression Anxious  Affect Anxious  Speech Logical/coherent  Interaction Assertive  Motor Activity Slow  Appearance/Hygiene Unremarkable  Behavior Characteristics Cooperative  Mood Depressed  Thought Process  Coherency WDL  Content WDL  Delusions None reported or observed  Perception WDL  Hallucination None reported or observed  Judgment WDL  Confusion None  Danger to Self  Current suicidal ideation? Denies  Danger to Others  Danger to Others None reported or observed

## 2022-03-18 NOTE — Group Note (Signed)
Recreation Therapy Group Note   Group Topic:Animal Assisted Therapy   Group Date: 03/18/2022 Start Time: 1430 End Time: 1500 Facilitators: Raea Magallon, Benito Mccreedy, LRT Location: [address] Dayroom  Animal-Assisted Activity (AAA) Program Checklist/Progress Notes Patient Eligibility Criteria Checklist & Daily Group note for Rec Tx Intervention   AAA/T Program Assumption of Risk Form signed by Patient/ or Parent Legal Guardian YES  Patient is free of allergies or severe asthma  YES  Patient reports no fear of animals YES  Patient reports no history of cruelty to animals YES  Patient understands their participation is voluntary YES  Patient washes hands before animal contact YES  Patient washes hands after animal contact YES   Group Description: Patients provided opportunity to interact with trained and credentialed [employer] Therapy dog and the community volunteer/dog handler. Patients practiced appropriate animal interaction and were educated on dog safety outside of the hospital in common community settings. Patients were encouraged to socialize with one another and share about their experiences with animals and pets. Patients participated with turn taking for dog interactions with structure imposed as needed based on number of participants and quality of spontaneous participation delivered.  Goal Area(s) Addresses:  Patient will demonstrate appropriate social skills during group session.  Patient will demonstrate ability to follow instructions during group session.  Patient will identify if a reduction in stress level occurs as a result of participation in animal assisted therapy session.    Education: Charity fundraiser, Health visitor, Communication & Social Skills   Affect/Mood: Appropriate and Congruent   Participation Level: Moderate   Participation Quality: Independent   Behavior: Cooperative   Modes of Intervention: Activity, Teaching laboratory technician, and  Socialization   Patient Response to Interventions:  Attentive   Education Outcome:  In group clarification offered    Clinical Observations/Individualized Feedback: Pt appropriately pet the visiting therapy dog, Brianna upon approach by animal. Pt shared that they do not currently have pets at home. Pt left dayroom early and did not return.    Benito Mccreedy Blessing Zaucha, LRT, CTRS 03/18/2022 4:28 PM

## 2022-03-18 NOTE — Progress Notes (Signed)
    03/18/22 2015  Psych Admission Type (Psych Patients Only)  Admission Status Voluntary  Psychosocial Assessment  Patient Complaints Anxiety;Depression  Eye Contact Fair  Facial Expression Sad  Affect Sad  Speech Logical/coherent  Interaction Assertive  Motor Activity Slow  Appearance/Hygiene Unremarkable  Behavior Characteristics Cooperative;Appropriate to situation  Mood Depressed  Thought Process  Coherency WDL  Content WDL  Delusions None reported or observed  Perception WDL  Hallucination None reported or observed  Judgment WDL  Confusion None  Danger to Self  Current suicidal ideation? Denies  Danger to Others  Danger to Others None reported or observed

## 2022-03-18 NOTE — Progress Notes (Signed)
Adult Psychoeducational Group Note  Date:  03/18/2022 Time:  8:47 PM  Group Topic/Focus:  Wrap-Up Group:   The focus of this group is to help patients review their daily goal of treatment and discuss progress on daily workbooks.  Participation Level:  Active  Participation Quality:  Appropriate  Affect:  Appropriate  Cognitive:  Appropriate  Insight: Appropriate  Engagement in Group:  Engaged  Modes of Intervention:  Discussion  Additional Comments:  Patient said her day was a7. Her goal discharge plan and she achieved her goal. Coping skills reading writing and creative poetry  Kristina Owens 03/18/2022, 8:47 PM

## 2022-03-19 DIAGNOSIS — F333 Major depressive disorder, recurrent, severe with psychotic symptoms: Principal | ICD-10-CM

## 2022-03-19 MED ORDER — DOCUSATE SODIUM 100 MG PO CAPS: 100.0000 mg | ORAL_CAPSULE | Freq: Two times a day (BID) | ORAL | 0 refills | Status: AC | PRN

## 2022-03-19 MED ORDER — RIVAROXABAN 20 MG PO TABS: 20.0000 mg | ORAL_TABLET | Freq: Every day | ORAL | 0 refills | Status: DC

## 2022-03-19 MED ORDER — BENZOCAINE 10 % MT GEL
Freq: Two times a day (BID) | OROMUCOSAL | 0 refills | Status: DC | PRN
Start: 2022-03-19 — End: 2022-05-08

## 2022-03-19 MED ORDER — ARIPIPRAZOLE 15 MG PO TABS: 15.0000 mg | ORAL_TABLET | Freq: Every day | ORAL | 0 refills | Status: DC

## 2022-03-19 MED ORDER — ESCITALOPRAM OXALATE 5 MG PO TABS: 15.0000 mg | ORAL_TABLET | Freq: Every day | ORAL | 0 refills | Status: DC

## 2022-03-19 MED ORDER — ADULT MULTIVITAMIN W/MINERALS CH: 1.0000 | ORAL_TABLET | Freq: Every day | ORAL | Status: DC

## 2022-03-19 MED ORDER — NICOTINE 21 MG/24HR TD PT24: 21.0000 mg | MEDICATED_PATCH | Freq: Every day | TRANSDERMAL | 0 refills | Status: AC

## 2022-03-19 MED ORDER — HYDROXYZINE HCL 50 MG PO TABS
50.0000 mg | ORAL_TABLET | Freq: Three times a day (TID) | ORAL | 0 refills | Status: AC | PRN
Start: 2022-03-19 — End: 2022-04-18

## 2022-03-19 MED ORDER — GABAPENTIN 100 MG PO CAPS
200.0000 mg | ORAL_CAPSULE | Freq: Three times a day (TID) | ORAL | 0 refills | Status: DC
Start: 2022-03-19 — End: 2022-07-21

## 2022-03-19 MED ORDER — GABAPENTIN 100 MG PO CAPS
200.0000 mg | ORAL_CAPSULE | Freq: Three times a day (TID) | ORAL | Status: DC
  Administered 2022-03-19 – 2022-03-20 (×2): 200 mg via ORAL
  Filled 2022-03-19: qty 2
  Filled 2022-03-19 (×3): qty 84
  Filled 2022-03-19 (×4): qty 2

## 2022-03-19 MED ORDER — TRAZODONE HCL 50 MG PO TABS: 50.0000 mg | ORAL_TABLET | Freq: Every evening | ORAL | 0 refills | Status: DC | PRN

## 2022-03-19 MED ORDER — METHOCARBAMOL 500 MG PO TABS: 500.0000 mg | ORAL_TABLET | Freq: Four times a day (QID) | ORAL | 0 refills | Status: AC | PRN
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
## 2022-03-19 NOTE — Group Note (Unsigned)
Date:  03/19/2022 Time:  4:48 PM  Group Topic/Focus:  Crisis Planning:   The purpose of this group is to help patients create a crisis plan for use upon discharge or in the future, as needed.     Participation Level:  {BHH PARTICIPATION HUDJS:97026}  Participation Quality:  {BHH PARTICIPATION QUALITY:22265}  Affect:  {BHH AFFECT:22266}  Cognitive:  {BHH COGNITIVE:22267}  Insight: {BHH Insight2:20797}  Engagement in Group:  {BHH ENGAGEMENT IN VZCHY:85027}  Modes of Intervention:  {BHH MODES OF INTERVENTION:22269}  Additional Comments:  ***  Reymundo Poll 03/19/2022, 4:48 PM

## 2022-03-19 NOTE — Progress Notes (Signed)
Kristina Owens requested to meet with chaplain for spiritual support.  She was interested in finding some specific passages in the Bible and asked some spiritual questions that have been on her mind.  Chaplain provided support, listening and assistance with the bible passages and provided spiritual support in exploring her questions.  [address] [facility] Pager, [phone]

## 2022-03-19 NOTE — BHH Counselor (Signed)
CSW met with patient and provided information on how to apply for disability.    Jady Braggs, LCSW, [place] Social Worker  [facility]

## 2022-03-19 NOTE — Group Note (Signed)
Date:  03/19/2022 Time:  4:49 PM  Group Topic/Focus:  Crisis Planning:   The purpose of this group is to help patients create a crisis plan for use upon discharge or in the future, as needed.    Participation Level:  Active  Participation Quality:  Appropriate  Affect:  Appropriate  Cognitive:  Appropriate  Insight: Good  Engagement in Group:  Engaged  Modes of Intervention:  Education  Additional Comments:     Reymundo Poll 03/19/2022, 4:49 PM

## 2022-03-19 NOTE — Progress Notes (Signed)
  [facility] Adult Case Management Discharge Plan :  Will you be returning to the same living situation after discharge:  No. Residential Rehab facility At discharge, do you have transportation home?: Yes,  Taxi through hospital  Do you have the ability to pay for your medications: Yes,  provided resources for affordable or discounted medication.  Release of information consent forms completed and in the chart;  Patient's signature needed at discharge.  Patient to Follow up at:  Follow-up Information     [place] Go to.   Specialty: Professional Counselor Why: Please go to this provider for therapy and medication management services during walk in hours for new patients:  Monday through Friday, from 9 am to 1 pm. Contact information: [address] [place] [address] [phone]         [name] Follow up.   Why: Please go and participate in admission intake on 03/20/2022 at 9am for admission to residential rehab.  Please follow treatment recommendations of facility. Contact information: [address] [place] [address] [phone]         Apply for disability. Go to.   Why: Go to this website to apply for disability. Contact information: [email or web address]                Next level of care provider has access to [facility] Link:no  Safety Planning and Suicide Prevention discussed: Yes,  sister [name] [phone]     Has patient been referred to the Quitline?: Patient refused referral, going to [facility] for ongoing addiction support  Patient has been referred for addiction treatment: Yes, [name] on 03/19/2022.   [name] E [name], LCSW 03/19/2022, 3:16 PM

## 2022-03-19 NOTE — Progress Notes (Signed)
[facility] MD Progress Note  03/19/2022 12:51 PM Kristina Owens  MRN:  [id number]  Subjective:  The patient is a 56 years old Caucasian who presents to the hospital for suicidal ideation, increased Depression with Psychosis.  The patient states "look I have a lot of mental health issues, physical health problems, I'm homeless, I started hearing voices, I'm an alcoholic, my mind is not right and I wish someone will pump some overdose drugs through through IV and will leave this world. I need help." States she is okay if she sleeps and never wake up.  The patient is being treated for the following psychiatric diagnoses: MDD, severe, recurrent, with psychosis GAD Alcohol use disorder Insomnia  Yesterday, the psychiatry team made the following recommendations: -Continue Abilify 15 mg once daily for MDD with psychosis -Continue Lexapro 15 mg once daily for MDD -Continue gabapentin 100 mg 3 times daily for restless legs, neuropathic pain, and alcohol use disorder -Continue hydroxyzine 50 mg as needed for anxiety -Continue trazodone 50 mg nightly just as needed for mood and insomnia  On my evaluation today, the patient reports that her mood is less depressed.  She reports that medication adjustments have been helpful for her mood and also anxiety. She reports that increased dose of hydroxyzine is helpful but causes sedation, but wishes to continue at the higher dose for now.  She reports that pain and sleep are better since starting gabapentin.  Denies having any nightmares last night, which is an improvement.  She reports that back pain is also less after starting gabapentin.  Reports appetite is okay.  She reports that she continues to have auditory hallucinations that are mood congruent, but denies CAH. Reports that AH are present but she is able to function and concentrate well despite these occuring  Denies VH.  Denies other psychotic symptoms.  Denies SI.  Denies HI.  She otherwise denies having any  side effects to current medications.  She is on Keflex for UTI.  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   Principal Problem: Major depressive disorder, recurrent episode, severe, with psychosis (HCC) Diagnosis: Principal Problem:   Major depressive disorder, recurrent episode, severe, with psychosis (HCC) Active Problems:   GAD (generalized anxiety disorder)   Alcohol dependence (HCC)   Insomnia   Constipation  Total Time spent with patient: 15 minutes  Past Psychiatric History: Per H&P: MDD, PTSD, OCD (check on things repeatedly) & ADHD   Past Medical History:  Past Medical History:  Diagnosis Date   Anxiety    Depression    DVT (deep vein thrombosis) in pregnancy    GERD (gastroesophageal reflux disease)    Ovarian cyst     Past Surgical History:  Procedure Laterality Date   APPENDECTOMY  age 35   IR PTA VENOUS EXCEPT DIALYSIS CIRCUIT  01/16/2021   IR RADIOLOGIST EVAL & MGMT  03/19/2021   IR THROMBECT VENO MECH MOD SED  01/16/2021   IR US GUIDE VASC ACCESS LEFT  01/16/2021   IR VENO/EXT/UNI LEFT  01/16/2021   IR VENOCAVAGRAM IVC  01/16/2021   TONSILLECTOMY Bilateral age 44   Family History:  Family History  Problem Relation Age of Onset   Asthma Mother    COPD Mother    Cancer Father        thyroid cancer   Family Psychiatric  History: See H&P  Social History:  Social History   Substance and Sexual Activity  Alcohol Use Yes  Social History   Substance and Sexual Activity  Drug Use Yes   Types: "Crack" cocaine, Cocaine, Methamphetamines   Comment: last used 4/31/22    Social History   Socioeconomic History   Marital status: Divorced    Spouse name: Not on file   Number of children: Not on file   Years of education: Not on file   Highest education level: Not on file  Occupational History   Not on file  Tobacco Use   Smoking status: Every Day    Packs/day: 0.50    Years: 35.00    Total pack years: 17.50    Types: Cigarettes   Smokeless  tobacco: Never  Vaping Use   Vaping Use: Never used  Substance and Sexual Activity   Alcohol use: Yes   Drug use: Yes    Types: "Crack" cocaine, Cocaine, Methamphetamines    Comment: last used 4/31/22   Sexual activity: Not on file  Other Topics Concern   Not on file  Social History Narrative   Not on file   Social Determinants of Health   Financial Resource Strain: Not on file  Food Insecurity: Not on file  Transportation Needs: Not on file  Physical Activity: Not on file  Stress: Not on file  Social Connections: Not on file   Additional Social History:                         Sleep: Better  Appetite:  Fair  Current Medications: Current Facility-Administered Medications  Medication Dose Route Frequency Provider Last Rate Last Admin   acetaminophen (TYLENOL) tablet 650 mg  650 mg Oral Q6H PRN Dahlia Byes C, NP   650 mg at 03/17/22 0620   alum & mag hydroxide-simeth (MAALOX/MYLANTA) 200-200-20 MG/5ML suspension 30 mL  30 mL Oral Q4H PRN Dahlia Byes C, NP       ARIPiprazole (ABILIFY) tablet 15 mg  15 mg Oral Daily Caylon Saine, MD   15 mg at 03/19/22 0812   benzocaine (ORAJEL) 10 % mucosal gel   Mouth/Throat BID PRN Cecilie Lowers, FNP   Given at 03/17/22 1154   cephALEXin (KEFLEX) capsule 500 mg  500 mg Oral Q6H Ntuen, Tina C, FNP   500 mg at 03/19/22 6237   docusate sodium (COLACE) capsule 100 mg  100 mg Oral BID Roselle Locus, MD   100 mg at 03/19/22 0812   escitalopram (LEXAPRO) tablet 15 mg  15 mg Oral Daily Ntuen, Tina C, FNP   15 mg at 03/19/22 6283   feeding supplement (ENSURE ENLIVE / ENSURE PLUS) liquid 237 mL  237 mL Oral BID BM Bobbitt, Shalon E, NP   237 mL at 03/19/22 1013   gabapentin (NEURONTIN) capsule 200 mg  200 mg Oral TID Shanora Christensen, Harrold Donath, MD       hydrOXYzine (ATARAX) tablet 50 mg  50 mg Oral TID PRN Phineas Inches, MD   50 mg at 03/18/22 0812   ibuprofen (ADVIL) tablet 600 mg  600 mg Oral Q6H PRN Roselle Locus, MD   600 mg at 03/19/22 1517   magnesium hydroxide (MILK OF MAGNESIA) suspension 30 mL  30 mL Oral Daily PRN Dahlia Byes C, NP       methocarbamol (ROBAXIN) tablet 500 mg  500 mg Oral Q6H PRN Dahlia Byes C, NP   500 mg at 03/19/22 6160   multivitamin with minerals tablet 1 tablet  1 tablet Oral Daily Sonny Dandy, NP  1 tablet at 03/19/22 7673   nicotine (NICODERM CQ - dosed in mg/24 hours) patch 21 mg  21 mg Transdermal Daily Daneil Dan O, NP   21 mg at 03/19/22 0813   rivaroxaban (XARELTO) tablet 20 mg  20 mg Oral Q supper Dahlia Byes C, NP   20 mg at 03/18/22 1800   thiamine (VITAMIN B1) tablet 100 mg  100 mg Oral Daily Daneil Dan O, NP   100 mg at 03/19/22 4193   traZODone (DESYREL) tablet 50 mg  50 mg Oral QHS PRN Dahlia Byes C, NP   50 mg at 03/18/22 2334    Lab Results: No results found for this or any previous visit (from the past 48 hour(s)).  Blood Alcohol level:  Lab Results  Component Value Date   ETH 76 (H) 03/08/2022   ETH <10 01/18/2022    Metabolic Disorder Labs: Lab Results  Component Value Date   HGBA1C 5.1 03/13/2022   MPG 99.67 03/13/2022   No results found for: "PROLACTIN" Lab Results  Component Value Date   CHOL 194 03/13/2022   TRIG 53 03/13/2022   HDL 54 03/13/2022   CHOLHDL 3.6 03/13/2022   VLDL 11 03/13/2022   LDLCALC 129 (H) 03/13/2022    Physical Findings: AIMS: Facial and Oral Movements Muscles of Facial Expression: None, normal Lips and Perioral Area: None, normal Jaw: None, normal Tongue: None, normal,Extremity Movements Upper (arms, wrists, hands, fingers): None, normal Lower (legs, knees, ankles, toes): None, normal, Trunk Movements Neck, shoulders, hips: None, normal, Overall Severity Severity of abnormal movements (highest score from questions above): None, normal Incapacitation due to abnormal movements: None, normal Patient's awareness of abnormal movements (rate only patient's report):  No Awareness, Dental Status Current problems with teeth and/or dentures?: No Does patient usually wear dentures?: No  CIWA:  CIWA-Ar Total: 1 COWS:     Musculoskeletal: Strength & Muscle Tone: within normal limits Gait & Station: normal Patient leans: N/A  Psychiatric Specialty Exam:  Presentation  General Appearance: Casual  Eye Contact:Fair  Speech: Normal rate  Speech Volume: Normal  Handedness:Right   Mood and Affect  Mood: Less depressed  Affect:Congruent; Restricted   Thought Process  Thought Processes:Linear  Descriptions of Associations:Intact  Orientation:Full (Time, Place and Person)  Thought Content:Logical  History of Schizophrenia/Schizoaffective disorder:No  Duration of Psychotic Symptoms:No data recorded Hallucinations:No data recorded Reports AH. Denies VH.   Ideas of Reference:None  Suicidal Thoughts:No data recorded Denies   Homicidal Thoughts:No data recorded Denies    Sensorium  Memory:Immediate Good; Recent Good; Remote Good  Judgment:Fair  Insight:Fair   Executive Functions  Concentration:Fair  Attention Span:Fair  Recall:Fair  Fund of Knowledge:Fair  Language:Good   Psychomotor Activity  Psychomotor Activity:No data recorded   Assets  Assets:Communication Skills; Physical Health; Resilience   Sleep  Sleep:No data recorded Better    Physical Exam: Physical Exam Vitals reviewed.  Pulmonary:     Effort: Pulmonary effort is normal.  Neurological:     Mental Status: She is alert.     Motor: No weakness.     Gait: Gait normal.    Review of Systems  Psychiatric/Behavioral:  Positive for depression, hallucinations and substance abuse. The patient is nervous/anxious and has insomnia.    Blood pressure 102/68, pulse 73, temperature 98.5 F (36.9 C), temperature source Oral, resp. rate 18, height 5\' 3"  (1.6 m), weight 53.1 kg, SpO2 100 %. Body mass index is 20.73 kg/m.   Treatment Plan  Summary: Daily contact with patient  to assess and evaluate symptoms and progress in treatment  Assessment: MDD severe recurrent with psychosis GAD Insomnia Alcohol use disorder  Plan: -Continue Abilify 15 mg once daily for MDD with psychosis -Continue Lexapro 15 mg once daily for MDD -Increase gabapentin to 200 mg 3 times daily for restless legs, neuropathic pain, and alcohol use disorder -Continue hydroxyzine 50 mg as needed for anxiety -Continue trazodone 50 mg nightly just as needed for mood and insomnia  Previously discontinued Zoloft Previously completed Ativan taper per detox protocol  Safety and Monitoring: Voluntary admission to inpatient psychiatric unit for safety, stabilization and treatment Daily contact with patient to assess and evaluate symptoms and progress in treatment Patient's case to be discussed in multi-disciplinary team meeting Observation Level : q15 minute checks Vital signs: q12 hours Precautions: suicide     Discharge Planning: Social work and case management to assist with discharge planning and identification of hospital follow-up needs prior to discharge Estimated LOS: 5-7 days Discharge Concerns: Need to establish a safety plan; Medication compliance and effectiveness Discharge Goals: Rehab/outpatient referrals for mental health follow-up including medication management/psychotherapy   Discharge planning to [facility] on Thursday morning (accepted for Thursday) for residential substance use treatment.   -will complete SRA, Med rec sample order, and print Rx today, for dc early tomorrow morning    Cristy Hilts, MD 03/19/2022, 12:51 PM   Total Time Spent in Direct Patient Care:  I personally spent 35 minutes on the unit in direct patient care. The direct patient care time included face-to-face time with the patient, reviewing the patient's chart, communicating with other professionals, and coordinating care. Greater than 50% of this time  was spent in counseling or coordinating care with the patient regarding goals of hospitalization, psycho-education, and discharge planning needs.   Phineas Inches, MD Psychiatrist

## 2022-03-19 NOTE — Progress Notes (Signed)
Kristina Owens's BP was low this morning.  She denies any dizziness or feeling like she is going to pass out.  Provided Gatorade and encouraged her to drink plenty of fluids.

## 2022-03-19 NOTE — Plan of Care (Signed)
  Problem: Coping: Goal: Ability to verbalize frustrations and anger appropriately will improve Outcome: Progressing Goal: Ability to demonstrate self-control will improve Outcome: Progressing   Problem: Activity: Goal: Interest or engagement in activities will improve Outcome: Progressing Goal: Sleeping patterns will improve Outcome: Progressing   Problem: Safety: Goal: Ability to disclose and discuss suicidal ideas will improve Outcome: Progressing Goal: Ability to identify and utilize support systems that promote safety will improve Outcome: Progressing

## 2022-03-19 NOTE — Group Note (Signed)
Date:  03/19/2022 Time:  4:29 PM  Group Topic/Focus:  Orientation:   The focus of this group is to educate the patient on the purpose and policies of crisis stabilization and provide a format to answer questions about their admission.  The group details unit policies and expectations of patients while admitted.    Participation Level:  Active  Participation Quality:  Appropriate  Affect:  Appropriate  Cognitive:  Appropriate  Insight: Appropriate  Engagement in Group:  Engaged  Modes of Intervention:  Discussion  Additional Comments:     Reymundo Poll 03/19/2022, 4:29 PM

## 2022-03-19 NOTE — Progress Notes (Signed)
Patient is pleasant on approach during shift assessment. Patient is alert and oriented X's 4, and tolerated schedule medication well with no side effect noted. Patient denies SI/HI/AVH, patient denies any pain or discomfort. No distress noted at this time. Staff will continue to monitor and provide support to patient.   03/19/22 0823  Psych Admission Type (Psych Patients Only)  Admission Status Voluntary  Psychosocial Assessment  Patient Complaints Anxiety;Depression  Eye Contact Fair  Facial Expression Sad  Affect Sad  Speech Logical/coherent  Interaction Assertive  Motor Activity Slow  Appearance/Hygiene Unremarkable  Behavior Characteristics Cooperative;Appropriate to situation  Mood Depressed  Thought Process  Coherency WDL  Content WDL  Delusions None reported or observed  Perception WDL  Hallucination None reported or observed  Judgment WDL  Confusion None  Danger to Self  Current suicidal ideation? Denies  Agreement Not to Harm Self Yes  Description of Agreement Verbally contracts for safety  Danger to Others  Danger to Others None reported or observed

## 2022-03-19 NOTE — Progress Notes (Signed)
Adult Psychoeducational Group Note  Date:  03/19/2022 Time:  9:12 PM  Group Topic/Focus:  Wrap-Up Group:   The focus of this group is to help patients review their daily goal of treatment and discuss progress on daily workbooks.  Participation Level:  Active  Participation Quality:  Appropriate  Affect:  Appropriate  Cognitive:  Appropriate  Insight: Appropriate  Engagement in Group:  Engaged  Modes of Intervention:  Discussion  Additional Comments:  Patient attend NA group  Charna Busman Long 03/19/2022, 9:12 PM

## 2022-03-19 NOTE — Group Note (Signed)
Date:  03/19/2022 Time:  4:41 PM  Group Topic/Focus:  Crisis Planning:   The purpose of this group is to help patients create a crisis plan for use upon discharge or in the future, as needed.    Participation Level:  Active  Participation Quality:  Appropriate  Affect:  Appropriate  Cognitive:  Appropriate  Insight: Appropriate  Engagement in Group:  Engaged  Modes of Intervention:  Education  Additional Comments:     Reymundo Poll 03/19/2022, 4:41 PM

## 2022-03-19 NOTE — Group Note (Unsigned)
Date:  03/19/2022 Time:  4:26 PM  Group Topic/Focus:  Orientation:   The focus of this group is to educate the patient on the purpose and policies of crisis stabilization and provide a format to answer questions about their admission.  The group details unit policies and expectations of patients while admitted.     Participation Level:  {B[facility] PARTICIPATION LEVEL:22264}  Participation Quality:  {B[facility] PARTICIPATION QUALITY:22265}  Affect:  {B[facility] AFFECT:22266}  Cognitive:  {[facility] COGNITIVE:22267}  Insight: {B[facility] Insight2:20797}  Engagement in Group:  {[facility] ENGAGEMENT IN GROUP:22268}  Modes of Intervention:  {B[facility] MODES OF INTERVENTION:22269}  Additional Comments:  ***  Leslee Haueter D Corah Willeford 03/19/2022, 4:26 PM  

## 2022-03-20 ENCOUNTER — Emergency Department (HOSPITAL_COMMUNITY)
Admission: EM | Admit: 2022-03-20 | Discharge: 2022-03-20 | Disposition: A | Discharge status: Home / Self Care | Payer: Medicaid Other | Attending: Emergency Medicine | Admitting: Emergency Medicine

## 2022-03-20 ENCOUNTER — Emergency Department (HOSPITAL_COMMUNITY): Payer: Medicaid Other

## 2022-03-20 ENCOUNTER — Encounter (HOSPITAL_COMMUNITY): Payer: Self-pay | Admitting: Emergency Medicine

## 2022-03-20 ENCOUNTER — Emergency Department (HOSPITAL_BASED_OUTPATIENT_CLINIC_OR_DEPARTMENT_OTHER): Payer: Medicaid Other

## 2022-03-20 DIAGNOSIS — M5430 Sciatica, unspecified side: Secondary | ICD-10-CM

## 2022-03-20 DIAGNOSIS — Z7901 Long term (current) use of anticoagulants: Secondary | ICD-10-CM | POA: Insufficient documentation

## 2022-03-20 DIAGNOSIS — M7989 Other specified soft tissue disorders: Secondary | ICD-10-CM

## 2022-03-20 DIAGNOSIS — M79605 Pain in left leg: Secondary | ICD-10-CM | POA: Insufficient documentation

## 2022-03-20 LAB — CBC WITH DIFFERENTIAL/PLATELET
Abs Immature Granulocytes: 0.06 10*3/uL (ref 0.00–0.07)
Basophils Absolute: 0.1 10*3/uL (ref 0.0–0.1)
Basophils Relative: 1 %
Eosinophils Absolute: 0.2 10*3/uL (ref 0.0–0.5)
Eosinophils Relative: 2 %
HCT: 44 % (ref 36.0–46.0)
Hemoglobin: 14.2 g/dL (ref 12.0–15.0)
Immature Granulocytes: 1 %
Lymphocytes Relative: 32 %
Lymphs Abs: 3 10*3/uL (ref 0.7–4.0)
MCH: 29.8 pg (ref 26.0–34.0)
MCHC: 32.3 g/dL (ref 30.0–36.0)
MCV: 92.4 fL (ref 80.0–100.0)
Monocytes Absolute: 0.7 10*3/uL (ref 0.1–1.0)
Monocytes Relative: 8 %
Neutro Abs: 5.3 10*3/uL (ref 1.7–7.7)
Neutrophils Relative %: 56 %
Platelets: 286 10*3/uL (ref 150–400)
RBC: 4.76 MIL/uL (ref 3.87–5.11)
RDW: 12.4 % (ref 11.5–15.5)
WBC: 9.3 10*3/uL (ref 4.0–10.5)
nRBC: 0 % (ref 0.0–0.2)

## 2022-03-20 LAB — COMPREHENSIVE METABOLIC PANEL
ALT: 26 U/L (ref 0–44)
AST: 18 U/L (ref 15–41)
Albumin: 4.1 g/dL (ref 3.5–5.0)
Alkaline Phosphatase: 86 U/L (ref 38–126)
Anion gap: 9 (ref 5–15)
BUN: 17 mg/dL (ref 6–20)
CO2: 25 mmol/L (ref 22–32)
Calcium: 9.2 mg/dL (ref 8.9–10.3)
Chloride: 106 mmol/L (ref 98–111)
Creatinine, Ser: 0.64 mg/dL (ref 0.44–1.00)
GFR, Estimated: >60 mL/min (ref >60)
Glucose, Bld: 89 mg/dL (ref 70–99)
Potassium: 4.2 mmol/L (ref 3.5–5.1)
Sodium: 140 mmol/L (ref 135–145)
Total Bilirubin: 0.4 mg/dL (ref 0.3–1.2)
Total Protein: 7.1 g/dL (ref 6.5–8.1)

## 2022-03-20 LAB — URINALYSIS, ROUTINE W REFLEX MICROSCOPIC
Bilirubin Urine: NEGATIVE
Glucose, UA: NEGATIVE mg/dL
Hgb urine dipstick: NEGATIVE
Ketones, ur: NEGATIVE mg/dL
Leukocytes,Ua: NEGATIVE
Nitrite: NEGATIVE
Protein, ur: NEGATIVE mg/dL
Specific Gravity, Urine: 1.008 (ref 1.005–1.030)
pH: 6 (ref 5.0–8.0)

## 2022-03-20 MED ORDER — METHYLPREDNISOLONE SODIUM SUCC 125 MG IJ SOLR
125.0000 mg | Freq: Once | INTRAMUSCULAR | Status: DC
Start: 2022-03-20 — End: 2022-03-20
  Filled 2022-03-20: qty 2

## 2022-03-20 MED ORDER — KETOROLAC TROMETHAMINE 15 MG/ML IJ SOLN
15.0000 mg | Freq: Once | INTRAMUSCULAR | Status: AC
  Administered 2022-03-20: 15 mg via INTRAMUSCULAR
  Filled 2022-03-20: qty 1

## 2022-03-20 MED ORDER — IBUPROFEN 600 MG PO TABS
600.0000 mg | ORAL_TABLET | Freq: Four times a day (QID) | ORAL | 0 refills | Status: DC | PRN
Start: 2022-03-20 — End: 2022-07-14

## 2022-03-20 MED ORDER — GABAPENTIN 300 MG PO CAPS
300.0000 mg | ORAL_CAPSULE | Freq: Once | ORAL | Status: DC
Start: 2022-03-20 — End: 2022-03-20

## 2022-03-20 MED ORDER — DEXAMETHASONE 4 MG PO TABS
10.0000 mg | ORAL_TABLET | Freq: Once | ORAL | Status: AC
  Administered 2022-03-20: 10 mg via ORAL
  Filled 2022-03-20: qty 3

## 2022-03-20 MED ORDER — GABAPENTIN 100 MG PO CAPS
200.0000 mg | ORAL_CAPSULE | Freq: Once | ORAL | Status: AC
  Administered 2022-03-20: 200 mg via ORAL
  Filled 2022-03-20: qty 2

## 2022-03-20 NOTE — Progress Notes (Signed)
Called safe transportation to arrange patients transportation to [facility] in [place], [place] for this morning to arrive by 8 AM. Spoke to [place].

## 2022-03-20 NOTE — ED Notes (Signed)
Reviewed discharge instructions with patient. Follow-up care and medications reviewed. Patient  verbalized understanding. Patient A&Ox4, VSS, and ambulatory with steady gait upon discharge.  °

## 2022-03-20 NOTE — Discharge Summary (Addendum)
Physician Discharge Summary Note  Patient:  Kristina Owens is an 56 y.o., female MRN:  [id number] DOB:  [birth year] Patient phone:  [phone] (home)  Patient address:   [place] [address],  Total Time spent with patient: 20 minutes  Date of Admission:  03/10/2022 Date of Discharge: 03-20-2022  Reason for Admission:      The patient is a 56 years old Caucasian who presents to the hospital for suicidal ideation, increased Depression with Psychosis.  The patient states "look I have a lot of mental health issues, physical health problems, I'm homeless, I started hearing voices, I'm an alcoholic, my mind is not right and I wish someone will pump some overdose drugs through through IV and will leave this world. I need help." States she is okay if she sleeps and never wake up.    Principal Problem: Major depressive disorder, recurrent episode, severe, with psychosis ([place]) Discharge Diagnoses: Principal Problem:   Major depressive disorder, recurrent episode, severe, with psychosis ([place]) Active Problems:   GAD (generalized anxiety disorder)   Alcohol dependence ([place])   Insomnia   Constipation   Past Psychiatric History:  MDD, PTSD, OCD (check on things repeatedly) & ADHD  Past Medical History:  Past Medical History:  Diagnosis Date   Anxiety    Depression    DVT (deep vein thrombosis) in pregnancy    GERD (gastroesophageal reflux disease)    Ovarian cyst     Past Surgical History:  Procedure Laterality Date   APPENDECTOMY  age 21   IR PTA VENOUS EXCEPT DIALYSIS CIRCUIT  01/16/2021   IR RADIOLOGIST EVAL & MGMT  03/19/2021   IR THROMBECT VENO MECH MOD SED  01/16/2021   IR US GUIDE VASC ACCESS LEFT  01/16/2021   IR VENO/EXT/UNI LEFT  01/16/2021   IR VENOCAVAGRAM IVC  01/16/2021   TONSILLECTOMY Bilateral age 75   Family History:  Family History  Problem Relation Age of Onset   Asthma Mother    COPD Mother    Cancer Father        thyroid cancer   Family  Psychiatric  History:  NO  Social History:  Social History   Substance and Sexual Activity  Alcohol Use Yes     Social History   Substance and Sexual Activity  Drug Use Yes   Types: "Crack" cocaine, Cocaine, Methamphetamines   Comment: last used 4/31/22    Social History   Socioeconomic History   Marital status: Divorced    Spouse name: Not on file   Number of children: Not on file   Years of education: Not on file   Highest education level: Not on file  Occupational History   Not on file  Tobacco Use   Smoking status: Every Day    Packs/day: 0.50    Years: 35.00    Total pack years: 17.50    Types: Cigarettes   Smokeless tobacco: Never  Vaping Use   Vaping Use: Never used  Substance and Sexual Activity   Alcohol use: Yes   Drug use: Yes    Types: "Crack" cocaine, Cocaine, Methamphetamines    Comment: last used 4/31/22   Sexual activity: Not on file  Other Topics Concern   Not on file  Social History Narrative   Not on file   Social Determinants of Health   Financial Resource Strain: Not on file  Food Insecurity: Not on file  Transportation Needs: Not on file  Physical Activity: Not on file  Stress: Not on  file  Social Connections: Not on file    Hospital Course:      During the patient's hospitalization, patient had extensive initial psychiatric evaluation, and follow-up psychiatric evaluations every day.   Psychiatric diagnoses provided upon initial assessment:  MDD, severe, recurrent, with psychosis GAD Alcohol use disorder Insomnia   Patient's psychiatric medications were adjusted on admission:  - Discontinued Zoloft (per pt report lexapro has helped in the past) - Start Laxapro 10mg  po daily for MDD - Start Lorazepam 1mg  4x, 3x, 2x, 1 daily for Detox protocol - Continue Abilify 5 mg daily for hallucinations - Nicotine gum 2mg  (for smoking cessation)     During the hospitalization, other adjustments were made to the patient's psychiatric  medication regimen:  -lexapro was incrased to 15 mg once daily -abilify was increased to 15 mg once daily -ativan taper was completed -gabapentin was started and increased to 200 mg tid    Patient's care was discussed during the interdisciplinary team meeting every day during the hospitalization.   The patient denied having side effects to prescribed psychiatric medication.   Gradually, patient started adjusting to milieu. The patient was evaluated each day by a clinical provider to ascertain response to treatment. Improvement was noted by the patient's report of decreasing symptoms, improved sleep and appetite, affect, medication tolerance, behavior, and participation in unit programming.  Patient was asked each day to complete a self inventory noting mood, mental status, pain, new symptoms, anxiety and concerns.     Symptoms were reported as significantly decreased or resolved completely by discharge.    On day of discharge interview 03-19-2022, the patient reports that their mood is stable. The patient denied having suicidal thoughts for more than 48 hours prior to discharge.  Patient denies having homicidal thoughts.  Patient reported that auditory hallucinations are less.  Patient denies any visual hallucinations or other symptoms of psychosis. The patient was motivated to continue taking medication with a goal of continued improvement in mental health.    The patient reports their target psychiatric symptoms of depression, alcohol w/d symptoms, and suicidal thoughts, all responded well to the psychiatric medications, and the patient reports overall benefit other psychiatric hospitalization. Supportive psychotherapy was provided to the patient. The patient also participated in regular group therapy while hospitalized. Coping skills, problem solving as well as relaxation therapies were also part of the unit programming.   Labs were reviewed with the patient, and abnormal results were discussed  with the patient.   The patient is able to verbalize their individual safety plan to this provider.   # It is recommended to the patient to continue psychiatric medications as prescribed, after discharge from the hospital.     # It is recommended to the patient to follow up with your outpatient psychiatric provider and PCP.   # It was discussed with the patient, the impact of alcohol, drugs, tobacco have been there overall psychiatric and medical wellbeing, and total abstinence from substance use was recommended the patient.ed.   # Prescriptions provided or sent directly to preferred pharmacy at discharge. Patient agreeable to plan. Given opportunity to ask questions. Appears to feel comfortable with discharge.    # In the event of worsening symptoms, the patient is instructed to call the crisis hotline, 911 and or go to the nearest ED for appropriate evaluation and treatment of symptoms. To follow-up with primary care provider for other medical issues, concerns and or health care needs   # Patient was discharged to [facility]  recovery with a plan to follow up as noted below.    Physical Findings: AIMS: Facial and Oral Movements Muscles of Facial Expression: None, normal Lips and Perioral Area: None, normal Jaw: None, normal Tongue: None, normal,Extremity Movements Upper (arms, wrists, hands, fingers): None, normal Lower (legs, knees, ankles, toes): None, normal, Trunk Movements Neck, shoulders, hips: None, normal, Overall Severity Severity of abnormal movements (highest score from questions above): None, normal Incapacitation due to abnormal movements: None, normal Patient's awareness of abnormal movements (rate only patient's report): No Awareness, Dental Status Current problems with teeth and/or dentures?: No Does patient usually wear dentures?: No  CIWA:  CIWA-Ar Total: 1 COWS:     Musculoskeletal: Strength & Muscle Tone: within normal limits Gait & Station: normal Patient  leans: N/A   Psychiatric Specialty Exam:  Presentation  General Appearance: Appropriate for Environment; Casual; Fairly Groomed  Eye Contact:Good  Speech:Clear and Coherent; Normal Rate  Speech Volume:Normal  Handedness:Right   Mood and Affect  Mood:Anxious; Euthymic  Affect:Appropriate; Congruent; Full Range   Thought Process  Thought Processes:Linear  Descriptions of Associations:Intact  Orientation:Full (Time, Place and Person)  Thought Content:Logical  History of Schizophrenia/Schizoaffective disorder:No  Duration of Psychotic Symptoms:No data recorded Hallucinations:Hallucinations: Auditory  Ideas of Reference:None  Suicidal Thoughts:Suicidal Thoughts: No  Homicidal Thoughts:Homicidal Thoughts: No   Sensorium  Memory:Immediate Good; Recent Good; Remote Good  Judgment:Fair  Insight:Fair   Executive Functions  Concentration:Fair  Attention Span:Fair  Recall:Fair  Fund of Knowledge:Fair  Language:Good   Psychomotor Activity  Psychomotor Activity:Psychomotor Activity: Normal   Assets  Assets:Communication Skills; Physical Health; Resilience   Sleep  Sleep:Sleep: Fair    Physical Exam: Physical Exam Vitals reviewed.  Pulmonary:     Effort: Pulmonary effort is normal.  Neurological:     Mental Status: She is alert.     Motor: No weakness.     Gait: Gait normal.  Psychiatric:        Mood and Affect: Mood normal.        Behavior: Behavior normal.        Thought Content: Thought content normal.        Judgment: Judgment normal.    Review of Systems  Constitutional:  Negative for chills and fever.  Cardiovascular:  Negative for chest pain and palpitations.  Neurological:  Negative for dizziness, tingling, tremors and headaches.  Psychiatric/Behavioral:  Positive for substance abuse. Negative for depression, hallucinations, memory loss and suicidal ideas. The patient is not nervous/anxious and does not have insomnia.   All  other systems reviewed and are negative.  Blood pressure 93/71, pulse 69, temperature 97.9 F (36.6 C), temperature source Oral, resp. rate 18, height 5\' 3"  (1.6 m), weight 53.1 kg, SpO2 100 %. Body mass index is 20.73 kg/m.   Social History   Tobacco Use  Smoking Status Every Day   Packs/day: 0.50   Years: 35.00   Total pack years: 17.50   Types: Cigarettes  Smokeless Tobacco Never   Tobacco Cessation:  A prescription for an FDA-approved tobacco cessation medication provided at discharge   Blood Alcohol level:  Lab Results  Component Value Date   ETH 76 (H) 03/08/2022   ETH <10 01/18/2022    Metabolic Disorder Labs:  Lab Results  Component Value Date   HGBA1C 5.1 03/13/2022   MPG 99.67 03/13/2022   No results found for: "PROLACTIN" Lab Results  Component Value Date   CHOL 194 03/13/2022   TRIG 53 03/13/2022   HDL 54 03/13/2022  CHOLHDL 3.6 03/13/2022   VLDL 11 03/13/2022   LDLCALC 129 (H) 03/13/2022    See Psychiatric Specialty Exam and Suicide Risk Assessment completed by Attending Physician prior to discharge.  Discharge destination:  [name]  Is patient on multiple antipsychotic therapies at discharge:  No   Has Patient had three or more failed trials of antipsychotic monotherapy by history:  No  Recommended Plan for Multiple Antipsychotic Therapies: NA  Discharge Instructions     Diet - low sodium heart healthy   Complete by: As directed    Increase activity slowly   Complete by: As directed       Allergies as of 03/20/2022   No Known Allergies      Medication List     STOP taking these medications    doxycycline 100 MG capsule Commonly known as: VIBRAMYCIN   ibuprofen 200 MG tablet Commonly known as: ADVIL   metroNIDAZOLE 500 MG tablet Commonly known as: FLAGYL   ondansetron 4 MG disintegrating tablet Commonly known as: ZOFRAN-ODT   Rivaroxaban Stater Pack (15 mg and 20 mg) Commonly known as: XARELTO STARTER  PACK Replaced by: rivaroxaban 20 MG Tabs tablet       TAKE these medications      Indication  ARIPiprazole 15 MG tablet Commonly known as: ABILIFY Take 1 tablet (15 mg total) by mouth daily.  Indication: MIXED BIPOLAR AFFECTIVE DISORDER   benzocaine 10 % mucosal gel Commonly known as: ORAJEL Use as directed in the mouth or throat 2 (two) times daily as needed for mouth pain.  Indication: tooth pain   docusate sodium 100 MG capsule Commonly known as: COLACE Take 1 capsule (100 mg total) by mouth 2 (two) times daily as needed for mild constipation.  Indication: Constipation   escitalopram 5 MG tablet Commonly known as: LEXAPRO Take 3 tablets (15 mg total) by mouth daily.  Indication: Generalized Anxiety Disorder   gabapentin 100 MG capsule Commonly known as: NEURONTIN Take 2 capsules (200 mg total) by mouth 3 (three) times daily.  Indication: Abuse or Misuse of Alcohol, Alcohol Withdrawal Syndrome, Neuropathic Pain, Social Anxiety Disorder   hydrOXYzine 50 MG tablet Commonly known as: ATARAX Take 1 tablet (50 mg total) by mouth 3 (three) times daily as needed for anxiety.  Indication: Feeling Anxious   methocarbamol 500 MG tablet Commonly known as: ROBAXIN Take 1 tablet (500 mg total) by mouth every 6 (six) hours as needed for muscle spasms. What changed: when to take this  Indication: Musculoskeletal Pain   multivitamin with minerals Tabs tablet Take 1 tablet by mouth daily.  Indication: vitamin   nicotine 21 mg/24hr patch Commonly known as: NICODERM CQ - dosed in mg/24 hours Place 1 patch (21 mg total) onto the skin daily for 28 days.  Indication: Nicotine Addiction   rivaroxaban 20 MG Tabs tablet Commonly known as: XARELTO Take 1 tablet (20 mg total) by mouth daily with supper. Replaces: Rivaroxaban Stater Pack (15 mg and 20 mg)  Indication: Blood Clot in a Deep Vein, Blockage of Blood Vessel to Lung by a Particle, Venous Thromboembolism   traZODone 50 MG  tablet Commonly known as: DESYREL Take 1 tablet (50 mg total) by mouth at bedtime as needed for sleep.  Indication: Trouble Sleeping        Follow-up Information     [place] Go to.   Specialty: Professional Counselor Why: Please go to this provider for therapy and medication management services during walk in hours  for new patients:  Monday through Friday, from 9 am to 1 pm. Contact information: [address] [place] [address] [phone]         [name] Follow up.   Why: Please go and participate in admission intake on 03/20/2022 at 9am for admission to residential rehab.  Please follow treatment recommendations of facility. Contact information: [address] [place] [address] [phone]         Apply for disability. Go to.   Why: Go to this website to apply for disability. Contact information: [email or web address]                Follow-up recommendations:      Activity: as tolerated   Diet: heart healthy   Other: -Follow-up with your outpatient psychiatric provider -instructions on appointment date, time, and address (location) are provided to you in discharge paperwork.   -Take your psychiatric medications as prescribed at discharge - instructions are provided to you in the discharge paperwork   -Follow-up with outpatient primary care doctor and other specialists -for management of preventative medicine and chronic medical disease   -Testing: Follow-up with outpatient provider for abnormal lab results:  LDL 129   -Recommend abstinence from alcohol, tobacco, and other illicit drug use at discharge.    -If your psychiatric symptoms recur, worsen, or if you have side effects to your psychiatric medications, call your outpatient psychiatric provider, 911, [phone] or go to the nearest emergency department.   -If suicidal thoughts recur, call your outpatient psychiatric provider, 911, [phone] or go to  the nearest emergency department.   Signed: Cristy Hilts, MD 03/20/2022, 8:05 AM

## 2022-03-20 NOTE — ED Provider Triage Note (Signed)
Emergency Medicine Provider Triage Evaluation Note  [facility] , a 56 y.o. female  was evaluated in triage.  Pt complains of lower back pain radiating down bilateral legs mainly on the right.  Patient reports that she has more pain with sitting but not as much with standing.  She is also reporting some left leg swelling and has mention some noncompliance with her Xarelto.  She denies any urinary or fecal incontinence.  Denies any urinary retention, saddle anesthesia, or fevers. H/o occasional IVDU.  Review of Systems  Positive:  Negative:   Physical Exam  BP 119/78 (BP Location: Left Arm)   Pulse 72   Temp 98.4 F (36.9 C) (Oral)   Resp 16   SpO2 100%  Gen:   Awake, no distress   Resp:  Normal effort  MSK:   Moves extremities without difficulty  Other:  Ambulatory, weightbearing, sensation intact.  Patient's left calf is slightly more swollen than her right calf.  Compartments are soft.  Medical Decision Making  Medically screening exam initiated at 12:45 PM.  Appropriate orders placed.  [place] was informed that the remainder of the evaluation will be completed by another provider, this initial triage assessment does not replace that evaluation, and the importance of remaining in the ED until their evaluation is complete.  Discussed with my attending.  Do not see need for MRI at this time.  Will order DVT study of the left leg as well as basic labs.   Achille Rich, PA-C 03/20/22 1246

## 2022-03-20 NOTE — ED Provider Notes (Signed)
[facility] EMERGENCY DEPARTMENT Provider Note   CSN: [id number] Arrival date & time: 03/20/22  1116     History  Chief Complaint  Patient presents with   Back Pain    Kristina Owens is a 56 y.o. female with a past medical history of bipolar disorder, alcohol use disorder, cocaine use disorder and anxiety presenting with back pain.  She reports its been going on for "a while."  It resolves with ibuprofen and Robaxin but comes back when those medications wear off.  She has also concerned that her left leg is swollen and somewhat tender.  History of DVT, currently anticoagulated.  Says that she has not missed any doses.  No saddle anesthesia, fever, chills, bowel/bladder dysfunction and denies any IVDU.   Back Pain      Home Medications Prior to Admission medications   Medication Sig Start Date End Date Taking? Authorizing Provider  ARIPiprazole (ABILIFY) 15 MG tablet Take 1 tablet (15 mg total) by mouth daily. 03/20/22 04/19/22  Massengill, Harrold Donath, MD  benzocaine (ORAJEL) 10 % mucosal gel Use as directed in the mouth or throat 2 (two) times daily as needed for mouth pain. 03/19/22   Massengill, Harrold Donath, MD  docusate sodium (COLACE) 100 MG capsule Take 1 capsule (100 mg total) by mouth 2 (two) times daily as needed for mild constipation. 03/19/22 04/18/22  Massengill, Harrold Donath, MD  escitalopram (LEXAPRO) 5 MG tablet Take 3 tablets (15 mg total) by mouth daily. 03/20/22 04/19/22  Massengill, Harrold Donath, MD  gabapentin (NEURONTIN) 100 MG capsule Take 2 capsules (200 mg total) by mouth 3 (three) times daily. 03/19/22 04/18/22  Massengill, Harrold Donath, MD  hydrOXYzine (ATARAX) 50 MG tablet Take 1 tablet (50 mg total) by mouth 3 (three) times daily as needed for anxiety. 03/19/22 04/18/22  Massengill, Harrold Donath, MD  methocarbamol (ROBAXIN) 500 MG tablet Take 1 tablet (500 mg total) by mouth every 6 (six) hours as needed for muscle spasms. 03/19/22 04/18/22  Massengill, Harrold Donath, MD  Multiple Vitamin  (MULTIVITAMIN WITH MINERALS) TABS tablet Take 1 tablet by mouth daily. 03/20/22   Massengill, Harrold Donath, MD  nicotine (NICODERM CQ - DOSED IN MG/24 HOURS) 21 mg/24hr patch Place 1 patch (21 mg total) onto the skin daily for 28 days. 03/20/22 04/17/22  Massengill, Harrold Donath, MD  rivaroxaban (XARELTO) 20 MG TABS tablet Take 1 tablet (20 mg total) by mouth daily with supper. 03/19/22 04/18/22  Massengill, Harrold Donath, MD  traZODone (DESYREL) 50 MG tablet Take 1 tablet (50 mg total) by mouth at bedtime as needed for sleep. 03/19/22 04/18/22  Phineas Inches, MD      Allergies    Patient has no known allergies.    Review of Systems   Review of Systems  Musculoskeletal:  Positive for back pain.    Physical Exam Updated Vital Signs BP 120/75 (BP Location: Left Arm)   Pulse 70   Temp 98.2 F (36.8 C) (Oral)   Resp 15   SpO2 100%  Physical Exam Vitals and nursing note reviewed.  Constitutional:      General: She is not in acute distress.    Appearance: Normal appearance. She is not ill-appearing.  HENT:     Head: Normocephalic and atraumatic.  Eyes:     General: No scleral icterus.    Conjunctiva/sclera: Conjunctivae normal.  Cardiovascular:     Pulses: Normal pulses.     Comments: Strong bilateral DP pulses Pulmonary:     Effort: Pulmonary effort is normal. No respiratory distress.  Musculoskeletal:  Comments: Full range of motion of all levels of the spine.  No midline tenderness.  Positive left straight leg raise.  Negative on the right.  Normal strength in bilateral lower extremities.  Ambulatory.  No appreciable left calf swelling  Skin:    Findings: No rash.  Neurological:     Mental Status: She is alert.     Motor: No weakness.     Gait: Gait normal.  Psychiatric:        Mood and Affect: Mood normal.        Behavior: Behavior normal.     ED Results / Procedures / Treatments   Labs (all labs ordered are listed, but only abnormal results are displayed) Labs Reviewed   URINALYSIS, ROUTINE W REFLEX MICROSCOPIC - Abnormal; Notable for the following components:      Result Value   Color, Urine STRAW (*)    All other components within normal limits  CBC WITH DIFFERENTIAL/PLATELET  COMPREHENSIVE METABOLIC PANEL    EKG None  Radiology VAS [place] LOWER EXTREMITY VENOUS (DVT) (ONLY [facility] & [facility])  Result Date: 03/20/2022  Lower Venous DVT Study Patient Name:  Kristina Owens  Date of Exam:   03/20/2022 Medical Rec #: [id number]          Accession #:    [id number] Date of Birth: March 20, 1966         Patient Gender: F Patient Age:   14 years Exam Location:  [facility] Procedure:      VAS [place] LOWER EXTREMITY VENOUS (DVT) Referring Phys: RILEY RANSOM --------------------------------------------------------------------------------  Indications: Swelling.  Comparison Study: Previous exam dated 01/06/22, negative for DVT. Performing Technologist: Magdalene River BS, RVT  Examination Guidelines: A complete evaluation includes B-mode imaging, spectral Doppler, color Doppler, and power Doppler as needed of all accessible portions of each vessel. Bilateral testing is considered an integral part of a complete examination. Limited examinations for reoccurring indications may be performed as noted. The reflux portion of the exam is performed with the patient in reverse Trendelenburg.  +-----+---------------+---------+-----------+----------+--------------+ RIGHTCompressibilityPhasicitySpontaneityPropertiesThrombus Aging +-----+---------------+---------+-----------+----------+--------------+ CFV  Full           Yes      Yes                                 +-----+---------------+---------+-----------+----------+--------------+   +---------+---------------+---------+-----------+----------+--------------+ LEFT     CompressibilityPhasicitySpontaneityPropertiesThrombus Aging +---------+---------------+---------+-----------+----------+--------------+ CFV      Full            Yes      Yes                                 +---------+---------------+---------+-----------+----------+--------------+ SFJ      Full                                                        +---------+---------------+---------+-----------+----------+--------------+ FV Prox  Full                                                        +---------+---------------+---------+-----------+----------+--------------+ FV Mid  Full                                                        +---------+---------------+---------+-----------+----------+--------------+ FV DistalFull                                                        +---------+---------------+---------+-----------+----------+--------------+ PFV      Full                                                        +---------+---------------+---------+-----------+----------+--------------+ POP      Full           Yes      Yes                                 +---------+---------------+---------+-----------+----------+--------------+ PTV      Full                                                        +---------+---------------+---------+-----------+----------+--------------+ PERO     Full                                                        +---------+---------------+---------+-----------+----------+--------------+     Summary: RIGHT: - No evidence of common femoral vein obstruction.  LEFT: - No evidence of deep vein thrombosis in the lower extremity. No indirect evidence of obstruction proximal to the inguinal ligament. - No cystic structure found in the popliteal fossa.  *See table(s) above for measurements and observations. Electronically signed by Sherald Hess MD on 03/20/2022 at 3:16:38 PM.    Final    DG Lumbar Spine Complete  Result Date: 03/20/2022 CLINICAL DATA:  Sciatica. EXAM: LUMBAR SPINE - COMPLETE 4+ VIEW COMPARISON:  None. FINDINGS: No evidence for an acute fracture or subluxation. Mild  loss of disc height noted L3-4. Facets are well aligned bilaterally. SI joints unremarkable. IMPRESSION: Mild degenerative disc disease at L3-4.  No acute bony abnormality. Electronically Signed   By: Kennith Center M.D.   On: 03/20/2022 13:18    Procedures Procedures   Medications Ordered in ED Medications  ketorolac (TORADOL) 15 MG/ML injection 15 mg (has no administration in time range)  methylPREDNISolone sodium succinate (SOLU-MEDROL) 125 mg/2 mL injection 125 mg (has no administration in time range)  gabapentin (NEURONTIN) capsule 200 mg (has no administration in time range)    ED Course/ Medical Decision Making/ A&P                           Medical Decision Making  This  patient presents to the ED for concern of back pain.  DDX includes but is not limited to cord compression, cauda equina, osteomyelitis, discitis, AAA, pyelonephritis, nephrolithiasis, ankylosing spondylitis, sciatica, dural abscess and herniated disc.     This is not an exhaustive differential.    Past Medical History / Co-morbidities / Social History: Polysubstance use   Additional history: Per chart review, patient [facility].  She was discharged this morning.  Noted to be homeless.  Physical Exam: Pertinent physical exam findings include +left SLR  Lab Tests: I ordered, and personally interpreted labs.  The pertinent results include:  Lab work within normal limits   Imaging Studies: Imaging ordered in triage.  I viewed and interpreted this and patient had a negative lower extremity ultrasound and nonconcerning lumbar spine radiographs     Medications: Given Toradol, gabapentin and Solu-Medrol   Disposition: 56 year old female presenting with back pain.  Reports its been going on for "a while."  Been worsening over the past week.  Denies any saddle anesthesia, bowel/bladder dysfunction, fever, chills, IVDU or leg weakness.  Physical exam with normal  strength and full range of motion.  Patient is ambulatory with a steady gait.  Highly doubt epidural abscess, cord compression, discitis or osteomyelitis.  Believe presentation is most consistent with sciatica as well as musculoskeletal discomfort from ambulating far distances with large amounts of luggage.  Do not believe her presentation warrants any further imaging such as MRI.  She will be given another prescription for ibuprofen and discharged home.  She is agreeable to this.  Return precautions were discussed and she was given a referral to a low-cost PCP.  I personally supplied her with food and drink and she is thankful for her care and discharged at this time. After consideration of the diagnostic results and the patients response to treatment, I feel that she is stable for discharge home.   I discussed this case with my attending physician Dr. Suezanne Jacquet who cosigned this note including patient's presenting symptoms, physical exam, and planned diagnostics and interventions. Attending physician stated agreement with plan or made changes to plan which were implemented.     Final Clinical Impression(s) / ED Diagnoses Final diagnoses:  Sciatic leg pain  Pain of left lower extremity    Rx / DC Orders ED Discharge Orders          Ordered    ibuprofen (ADVIL) 600 MG tablet  Every 6 hours PRN        03/20/22 1707           Results and diagnoses were explained to the patient. Return precautions discussed in full. Patient had no additional questions and expressed complete understanding.   This chart was dictated using voice recognition software.  Despite best efforts to proofread,  errors can occur which can change the documentation meaning.    Woodroe Chen 03/20/22 1720    Lonell Grandchild, MD 03/20/22 2306

## 2022-03-20 NOTE — Progress Notes (Signed)
Left LE venous duplex study completed. Please see CV Proc for preliminary results.  Janaki  Mousa Prout BS, RVT 03/20/2022 1:32 PM

## 2022-03-20 NOTE — Progress Notes (Signed)
   03/19/22 2013  Psych Admission Type (Psych Patients Only)  Admission Status Voluntary  Psychosocial Assessment  Patient Complaints Anxiety;Depression;Worrying  Eye Contact Fair  Facial Expression Anxious;Worried  Affect Anxious;Appropriate to circumstance;Depressed  Speech Logical/coherent  Interaction Assertive  Motor Activity Slow  Appearance/Hygiene Unremarkable  Behavior Characteristics Cooperative;Appropriate to situation;Anxious  Mood Depressed;Anxious;Pleasant  Thought Process  Coherency WDL  Content Blaming others  Delusions None reported or observed  Perception Hallucinations  Hallucination Visual (pt said that it's hard to explain)  Judgment WDL  Confusion None  Danger to Self  Current suicidal ideation? Denies  Agreement Not to Harm Self Yes  Description of Agreement verbally contracts for safety  Danger to Others  Danger to Others None reported or observed

## 2022-03-20 NOTE — Progress Notes (Signed)
RN met with pt and reviewed pt's discharge instructions.  Pt verbalized understanding of discharge instructions and pt did not have any questions. RN reviewed and provided pt with a copy of SRA, AVS and Transition Record.  RN returned pt's belongings to pt.  Paper prescriptions and medication samples were given to pt.  Pt denied SI/HI/AVH and voiced no concerns.  Pt was appreciative of the care pt received at [facility].  Patient discharged to the lobby without incident.  

## 2022-03-20 NOTE — Discharge Instructions (Addendum)
Make an appointment with a doctor attached to these discharge papers for primary care and further evaluation of your leg if it continues to bother you.  The ibuprofen prescription is attached and signed.  You may take it to any pharmacy of your choosing at any point you which she needed.

## 2022-03-20 NOTE — Progress Notes (Signed)
Pt's discharge paperwork has been discussed with her. Pt's AVS along with medications she is to continue upon discharge were discussed. Suicide prevention information was also provided. Pt verbally demonstrated understanding. Educated pt about the importance of abstinence from alcohol, tobacco, and other illicit drug use upon discharge. Medication scripts to be provided by day shift nurse along with all discharge paperwork. Opportunity to ask questions was provided. Pt had completed her suicide safety prevention plan and was provided a copy. Pt denies SI/HI and AVH. Pt to collect all of her belongings from her room and her belongings in her assigned locker with day shift nurse.

## 2022-03-20 NOTE — ED Triage Notes (Addendum)
Pt presents POV with left sided lower back pain ongoing but the pain is excruciating today, not able to sit down. Patient states its a shooting pain going down to the foot. Patient ambulatory. Patient endorses left leg swelling since yesterday. Hx of DVT in the past on eliquis. Concerned for same again.

## 2022-05-08 ENCOUNTER — Encounter (HOSPITAL_COMMUNITY): Payer: Self-pay

## 2022-05-08 ENCOUNTER — Other Ambulatory Visit (HOSPITAL_COMMUNITY)
Admission: EM | Admit: 2022-05-08 | Discharge: 2022-05-09 | Disposition: A | Discharge status: Home / Self Care | Payer: No Payment, Other | Attending: Psychiatry | Admitting: Psychiatry

## 2022-05-08 ENCOUNTER — Other Ambulatory Visit: Payer: Self-pay

## 2022-05-08 DIAGNOSIS — F39 Unspecified mood [affective] disorder: Secondary | ICD-10-CM

## 2022-05-08 DIAGNOSIS — F10129 Alcohol abuse with intoxication, unspecified: Secondary | ICD-10-CM

## 2022-05-08 DIAGNOSIS — Z5900 Homelessness unspecified: Secondary | ICD-10-CM

## 2022-05-08 DIAGNOSIS — Z79899 Other long term (current) drug therapy: Secondary | ICD-10-CM | POA: Insufficient documentation

## 2022-05-08 DIAGNOSIS — F1994 Other psychoactive substance use, unspecified with psychoactive substance-induced mood disorder: Secondary | ICD-10-CM | POA: Insufficient documentation

## 2022-05-08 DIAGNOSIS — Z7901 Long term (current) use of anticoagulants: Secondary | ICD-10-CM

## 2022-05-08 DIAGNOSIS — R45851 Suicidal ideations: Secondary | ICD-10-CM | POA: Diagnosis not present

## 2022-05-08 DIAGNOSIS — F1490 Cocaine use, unspecified, uncomplicated: Secondary | ICD-10-CM | POA: Insufficient documentation

## 2022-05-08 DIAGNOSIS — G4700 Insomnia, unspecified: Secondary | ICD-10-CM

## 2022-05-08 DIAGNOSIS — Z86711 Personal history of pulmonary embolism: Secondary | ICD-10-CM | POA: Insufficient documentation

## 2022-05-08 DIAGNOSIS — F333 Major depressive disorder, recurrent, severe with psychotic symptoms: Secondary | ICD-10-CM | POA: Insufficient documentation

## 2022-05-08 DIAGNOSIS — F419 Anxiety disorder, unspecified: Secondary | ICD-10-CM | POA: Insufficient documentation

## 2022-05-08 DIAGNOSIS — Z86718 Personal history of other venous thrombosis and embolism: Secondary | ICD-10-CM | POA: Insufficient documentation

## 2022-05-08 DIAGNOSIS — Z1152 Encounter for screening for COVID-19: Secondary | ICD-10-CM | POA: Insufficient documentation

## 2022-05-08 DIAGNOSIS — F1910 Other psychoactive substance abuse, uncomplicated: Secondary | ICD-10-CM

## 2022-05-08 LAB — CBC WITH DIFFERENTIAL/PLATELET
Abs Immature Granulocytes: 0.05 10*3/uL (ref 0.00–0.07)
Basophils Absolute: 0 10*3/uL (ref 0.0–0.1)
Basophils Relative: 1 %
Eosinophils Absolute: 0.1 10*3/uL (ref 0.0–0.5)
Eosinophils Relative: 1 %
HCT: 47.9 % — ABNORMAL HIGH (ref 36.0–46.0)
Hemoglobin: 16.1 g/dL — ABNORMAL HIGH (ref 12.0–15.0)
Immature Granulocytes: 1 %
Lymphocytes Relative: 52 %
Lymphs Abs: 3.4 10*3/uL (ref 0.7–4.0)
MCH: 30 pg (ref 26.0–34.0)
MCHC: 33.6 g/dL (ref 30.0–36.0)
MCV: 89.4 fL (ref 80.0–100.0)
Monocytes Absolute: 0.5 10*3/uL (ref 0.1–1.0)
Monocytes Relative: 8 %
Neutro Abs: 2.4 10*3/uL (ref 1.7–7.7)
Neutrophils Relative %: 37 %
Platelets: 237 10*3/uL (ref 150–400)
RBC: 5.36 MIL/uL — ABNORMAL HIGH (ref 3.87–5.11)
RDW: 13.2 % (ref 11.5–15.5)
WBC: 6.5 10*3/uL (ref 4.0–10.5)
nRBC: 0 % (ref 0.0–0.2)

## 2022-05-08 LAB — LIPID PANEL
Cholesterol: 187 mg/dL (ref 0–200)
HDL: 67 mg/dL (ref >40)
LDL Cholesterol: 97 mg/dL (ref 0–99)
Total CHOL/HDL Ratio: 2.8 RATIO
Triglycerides: 117 mg/dL (ref <150)
VLDL: 23 mg/dL (ref 0–40)

## 2022-05-08 LAB — POCT URINE DRUG SCREEN - MANUAL ENTRY (I-SCREEN)
POC Amphetamine UR: NOT DETECTED
POC Buprenorphine (BUP): NOT DETECTED
POC Cocaine UR: NOT DETECTED
POC Marijuana UR: NOT DETECTED
POC Methadone UR: NOT DETECTED
POC Methamphetamine UR: POSITIVE — AB
POC Morphine: NOT DETECTED
POC Oxazepam (BZO): NOT DETECTED
POC Oxycodone UR: NOT DETECTED
POC Secobarbital (BAR): NOT DETECTED

## 2022-05-08 LAB — POC SARS CORONAVIRUS 2 AG: SARSCOV2ONAVIRUS 2 AG: NEGATIVE

## 2022-05-08 LAB — COMPREHENSIVE METABOLIC PANEL
ALT: 17 U/L (ref 0–44)
AST: 17 U/L (ref 15–41)
Albumin: 3.7 g/dL (ref 3.5–5.0)
Alkaline Phosphatase: 111 U/L (ref 38–126)
Anion gap: 8 (ref 5–15)
BUN: 8 mg/dL (ref 6–20)
CO2: 27 mmol/L (ref 22–32)
Calcium: 9 mg/dL (ref 8.9–10.3)
Chloride: 100 mmol/L (ref 98–111)
Creatinine, Ser: 0.68 mg/dL (ref 0.44–1.00)
GFR, Estimated: >60 mL/min (ref >60)
Glucose, Bld: 93 mg/dL (ref 70–99)
Potassium: 3.9 mmol/L (ref 3.5–5.1)
Sodium: 135 mmol/L (ref 135–145)
Total Bilirubin: 0.6 mg/dL (ref 0.3–1.2)
Total Protein: 6.6 g/dL (ref 6.5–8.1)

## 2022-05-08 LAB — RESP PANEL BY RT-PCR (FLU A&B, COVID) ARPGX2
Influenza A by PCR: NEGATIVE
Influenza B by PCR: NEGATIVE
SARS Coronavirus 2 by RT PCR: NEGATIVE

## 2022-05-08 LAB — POCT PREGNANCY, URINE: Preg Test, Ur: NEGATIVE

## 2022-05-08 LAB — HEMOGLOBIN A1C
Hgb A1c MFr Bld: 4.9 % (ref 4.8–5.6)
Mean Plasma Glucose: 93.93 mg/dL

## 2022-05-08 LAB — PREGNANCY, URINE: Preg Test, Ur: NEGATIVE

## 2022-05-08 LAB — TSH: TSH: 3.264 u[IU]/mL (ref 0.350–4.500)

## 2022-05-08 MED ORDER — THIAMINE HCL 100 MG/ML IJ SOLN
100.0000 mg | Freq: Once | INTRAMUSCULAR | Status: DC
  Filled 2022-05-08: qty 2

## 2022-05-08 MED ORDER — MELATONIN 5 MG PO TABS
5.0000 mg | ORAL_TABLET | Freq: Every day | ORAL | Status: DC
  Administered 2022-05-08 (×2): 5 mg via ORAL
  Filled 2022-05-08 (×2): qty 1

## 2022-05-08 MED ORDER — LOPERAMIDE HCL 2 MG PO CAPS: 2.0000 mg | ORAL_CAPSULE | ORAL | Status: DC | PRN

## 2022-05-08 MED ORDER — RIVAROXABAN 20 MG PO TABS: 20.0000 mg | ORAL_TABLET | Freq: Every day | ORAL | Status: DC

## 2022-05-08 MED ORDER — ESCITALOPRAM OXALATE 5 MG PO TABS
5.0000 mg | ORAL_TABLET | Freq: Every day | ORAL | Status: DC
  Administered 2022-05-08: 5 mg via ORAL
  Filled 2022-05-08: qty 1

## 2022-05-08 MED ORDER — MAGNESIUM HYDROXIDE 400 MG/5ML PO SUSP: 30.0000 mL | Freq: Every day | ORAL | Status: DC | PRN

## 2022-05-08 MED ORDER — ALUM & MAG HYDROXIDE-SIMETH 200-200-20 MG/5ML PO SUSP: 30.0000 mL | ORAL | Status: DC | PRN

## 2022-05-08 MED ORDER — ONDANSETRON 4 MG PO TBDP
4.0000 mg | ORAL_TABLET | Freq: Four times a day (QID) | ORAL | Status: DC | PRN
  Administered 2022-05-08: 4 mg via ORAL
  Filled 2022-05-08: qty 1

## 2022-05-08 MED ORDER — ACETAMINOPHEN 325 MG PO TABS
650.0000 mg | ORAL_TABLET | Freq: Four times a day (QID) | ORAL | Status: DC | PRN
  Administered 2022-05-08: 650 mg via ORAL
  Filled 2022-05-08: qty 2

## 2022-05-08 MED ORDER — HYDROXYZINE HCL 25 MG PO TABS
25.0000 mg | ORAL_TABLET | Freq: Four times a day (QID) | ORAL | Status: DC | PRN
  Filled 2022-05-08: qty 1

## 2022-05-08 MED ORDER — LORAZEPAM 1 MG PO TABS
1.0000 mg | ORAL_TABLET | Freq: Four times a day (QID) | ORAL | Status: DC | PRN
  Administered 2022-05-08 (×2): 1 mg via ORAL
  Filled 2022-05-08 (×2): qty 1

## 2022-05-08 MED ORDER — THIAMINE MONONITRATE 100 MG PO TABS
100.0000 mg | ORAL_TABLET | Freq: Every day | ORAL | Status: DC
  Filled 2022-05-08: qty 1

## 2022-05-08 MED ORDER — ARIPIPRAZOLE 15 MG PO TABS
15.0000 mg | ORAL_TABLET | Freq: Once | ORAL | Status: AC
  Administered 2022-05-08: 15 mg via ORAL
  Filled 2022-05-08: qty 1

## 2022-05-08 MED ORDER — ADULT MULTIVITAMIN W/MINERALS CH
1.0000 | ORAL_TABLET | Freq: Every day | ORAL | Status: DC
  Administered 2022-05-08: 1 via ORAL
  Filled 2022-05-08 (×2): qty 1

## 2022-05-08 MED ORDER — RIVAROXABAN 20 MG PO TABS
20.0000 mg | ORAL_TABLET | Freq: Every day | ORAL | Status: DC
  Administered 2022-05-08: 20 mg via ORAL
  Filled 2022-05-08: qty 1

## 2022-05-08 NOTE — ED Notes (Signed)
Pt is in the bed sleeping. Respirations are even and unlabored. No acute distress noted. Will continue to monitor for safety. 
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
## 2022-05-08 NOTE — ED Notes (Signed)
Pt transferred to [facility] unit in no acute distress. Safety maintained.

## 2022-05-08 NOTE — Progress Notes (Signed)
   05/08/22 0059  [place] Triage Screening (Walk-ins at [facility] only)  How Did You Hear About [place]? Self  What Is the Reason for Your Visit/Call Today? Kristina Owens is a 56 year old female presenting as a voluntary walk-in to [facility] due to [facility] with plan to "continue to use fatal drugs" and help with detox from alcohol. Patient denied HI and psychosis. Patient is currently homeless. Patient reports using alcohol 2 hours ago and admitted to drinking daily. Patient does not know how much she drinks, stating "whatever I can get or what they will give me". 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 Patient reports panhandling to buy alcohol and drugs. Patient unable to recall information at times. Patient was cooperative during assessment.  How Long Has This Been Causing You Problems? 1 wk - 1 month  Have You Recently Had Any Thoughts About Hurting Yourself? Yes  How long ago did you have thoughts about hurting yourself? "don't know"  Are You Planning to [place] At This time? Yes  Have you Recently Had Thoughts About Hurting Someone Kristina Owens? No  Are You Planning To Harm Someone At This Time? No  Are you currently experiencing any auditory, visual or other hallucinations? Yes  Please explain the hallucinations you are currently experiencing: voices  Have You Used Any Alcohol or Drugs in the Past 24 Hours? Yes  How long ago did you use Drugs or Alcohol? alcohol  What Did You Use and How Much? "don't know"  Do you have any current medical co-morbidities that require immediate attention? No  Clinician description of patient physical appearance/behavior: disheveled / cooperative  What Do You Feel Would Help You the Most Today? Alcohol or Drug Use Treatment;Treatment for Depression or other mood problem  If access to [facility] Ur[facility] was not available, would you have sought care in the Emergency Department? Yes   Determination of Need Urgent (48 hours)  Options For Referral Outpatient Therapy;Medication Management;Chemical Dependency Intensive Outpatient Therapy (CDIOP);Other: Comment (Observation)

## 2022-05-08 NOTE — ED Notes (Signed)
Pt sleeping at present, no distress noted.  Monitoring for safety. 

## 2022-05-08 NOTE — ED Notes (Signed)
Pt sleeping in no acute distress. RR even and unlabored. Safety maintained. 

## 2022-05-08 NOTE — ED Provider Notes (Signed)
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   Total Bilirubin 03/14/2022 0.3  0.3 - 1.2 mg/dL Final   GFR, Estimated 03/14/2022 >60  >60 mL/min Final   Comment: (NOTE) Calculated using the CKD-EPI  Creatinine Equation (2021)    Anion gap 03/14/2022 5  5 - 15 Final   Performed at [facility], [address], [place], [place] [address]   Lipase 03/14/2022 32  11 - 51 U/L Final   Performed at [facility], [address] [place], [place] [address]   WBC 03/14/2022 8.8  4.0 - 10.5 K/uL Final   RBC 03/14/2022 4.37  3.87 - 5.11 MIL/uL Final   Hemoglobin 03/14/2022 13.2  12.0 - 15.0 g/dL Final   HCT 78/29/5621 39.9  36.0 - 46.0 % Final   MCV 03/14/2022 91.3  80.0 - 100.0 fL Final   MCH 03/14/2022 30.2  26.0 - 34.0 pg Final   MCHC 03/14/2022 33.1  30.0 - 36.0 g/dL Final   RDW 30/86/5784 12.4  11.5 - 15.5 % Final   Platelets 03/14/2022 230  150 - 400 K/uL Final   nRBC 03/14/2022 0.0  0.0 - 0.2 % Final   Neutrophils Relative % 03/14/2022 52  % Final   Neutro Abs 03/14/2022 4.7  1.7 - 7.7 K/uL Final   Lymphocytes Relative 03/14/2022 36  % Final   Lymphs Abs 03/14/2022 3.1  0.7 - 4.0 K/uL Final   Monocytes Relative 03/14/2022 9  % Final   Monocytes Absolute 03/14/2022 0.8  0.1 - 1.0 K/uL Final   Eosinophils Relative 03/14/2022 2  % Final   Eosinophils Absolute 03/14/2022 0.2  0.0 - 0.5 K/uL Final   Basophils Relative 03/14/2022 0  % Final   Basophils Absolute 03/14/2022 0.0  0.0 - 0.1 K/uL Final   Immature Granulocytes 03/14/2022 1  % Final   Abs Immature Granulocytes 03/14/2022 0.04  0.00 - 0.07 K/uL Final   Performed at [facility], [address], [place], [place] [address]   Color, Urine 03/14/2022 YELLOW  YELLOW Final   APPearance 03/14/2022 HAZY (A)  CLEAR Final   Specific Gravity, Urine 03/14/2022 1.012  1.005 - 1.030 Final   pH 03/14/2022 6.0  5.0 - 8.0 Final   Glucose, UA 03/14/2022 NEGATIVE  NEGATIVE mg/dL Final   Hgb urine dipstick 03/14/2022 NEGATIVE  NEGATIVE Final   Bilirubin Urine 03/14/2022 NEGATIVE  NEGATIVE Final   Ketones, ur 03/14/2022 NEGATIVE  NEGATIVE mg/dL Final   Protein, ur 52/84/1324 NEGATIVE   NEGATIVE mg/dL Final   Nitrite 40/05/2724 NEGATIVE  NEGATIVE Final   Leukocytes,Ua 03/14/2022 LARGE (A)  NEGATIVE Final   RBC / HPF 03/14/2022 0-5  0 - 5 RBC/hpf Final   WBC, UA 03/14/2022 21-50  0 - 5 WBC/hpf Final   Bacteria, UA 03/14/2022 RARE (A)  NONE SEEN Final   Squamous Epithelial / LPF 03/14/2022 0-5  0 - 5 Final   Performed at [facility], [address], [place], [place] [address]   Preg Test, Ur 03/14/2022 NEGATIVE  NEGATIVE Final   Comment:        THE SENSITIVITY OF THIS METHODOLOGY IS >20 mIU/mL. Performed at [facility], [address], [place], [place] [address]    Specimen Description 03/14/2022    Final  Value:URINE, CLEAN CATCH Performed at [facility], [address] [place], [place] [address]    Special Requests 03/14/2022    Final                   Value:NONE Performed at [facility], [address], [place], [place] [address]    Culture 03/14/2022 MULTIPLE SPECIES PRESENT, SUGGEST RECOLLECTION (A)   Final   Report Status 03/14/2022 03/16/2022 FINAL   Final  Admission on 03/07/2022, Discharged on 03/10/2022  Component Date Value Ref Range Status   Glucose-Capillary 03/08/2022 88  70 - 99 mg/dL Final   Glucose reference range applies only to samples taken after fasting for at least 8 hours.   Sodium 03/08/2022 140  135 - 145 mmol/L Final   Potassium 03/08/2022 3.8  3.5 - 5.1 mmol/L Final   Chloride 03/08/2022 107  98 - 111 mmol/L Final   CO2 03/08/2022 25  22 - 32 mmol/L Final   Glucose, Bld 03/08/2022 83  70 - 99 mg/dL Final   Glucose reference range applies only to samples taken after fasting for at least 8 hours.   BUN 03/08/2022 11  6 - 20 mg/dL Final   Creatinine, Ser 03/08/2022 0.53  0.44 - 1.00 mg/dL Final   Calcium 09/81/1914 8.3 (L)  8.9 - 10.3 mg/dL Final   Total Protein 78/29/5621 6.4 (L)  6.5 - 8.1 g/dL Final   Albumin 30/86/5784 3.5  3.5 - 5.0 g/dL  Final   AST 69/62/9528 15  15 - 41 U/L Final   ALT 03/08/2022 18  0 - 44 U/L Final   Alkaline Phosphatase 03/08/2022 72  38 - 126 U/L Final   Total Bilirubin 03/08/2022 0.3  0.3 - 1.2 mg/dL Final   GFR, Estimated 03/08/2022 >60  >60 mL/min Final   Comment: (NOTE) Calculated using the CKD-EPI Creatinine Equation (2021)    Anion gap 03/08/2022 8  5 - 15 Final   Performed at [facility], [address] [place], [place] [address]   Salicylate Lvl 03/08/2022 <7.0 (L)  7.0 - 30.0 mg/dL Final   Performed at [facility], [address] [place], [place] [address]   Acetaminophen (Tylenol), Serum 03/08/2022 <10 (L)  10 - 30 ug/mL Final   Comment: (NOTE) Therapeutic concentrations vary significantly. A range of 10-30 ug/mL  may be an effective concentration for many patients. However, some  are best treated at concentrations outside of this range. Acetaminophen concentrations >150 ug/mL at 4 hours after ingestion  and >50 ug/mL at 12 hours after ingestion are often associated with  toxic reactions.  Performed at [facility] [place], [address], [place], [place] [address]    Alcohol, Ethyl (B) 03/08/2022 76 (H)  <10 mg/dL Final   Comment: (NOTE) Lowest detectable limit for serum alcohol is 10 mg/dL.  For medical purposes only. Performed at [facility], [address], [place], [place] [address]    Opiates 03/09/2022 NONE DETECTED  NONE DETECTED Final   Cocaine 03/09/2022 NONE DETECTED  NONE DETECTED Final   Benzodiazepines 03/09/2022 NONE DETECTED  NONE DETECTED Final   Amphetamines 03/09/2022 NONE DETECTED  NONE DETECTED Final   Tetrahydrocannabinol 03/09/2022 NONE DETECTED  NONE DETECTED Final   Barbiturates 03/09/2022 NONE DETECTED  NONE DETECTED Final   Comment: (NOTE) DRUG SCREEN FOR MEDICAL PURPOSES ONLY.  IF CONFIRMATION IS NEEDED FOR ANY PURPOSE, NOTIFY LAB WITHIN 5 DAYS.  LOWEST DETECTABLE  LIMITS FOR  URINE DRUG SCREEN Drug Class                     Cutoff (ng/mL) Amphetamine and metabolites    1000 Barbiturate and metabolites    200 Benzodiazepine                 200 Tricyclics and metabolites     300 Opiates and metabolites        300 Cocaine and metabolites        300 THC                            50 Performed at [facility], [address], [place], [place] [address]    WBC 03/08/2022 7.3  4.0 - 10.5 K/uL Final   RBC 03/08/2022 4.12  3.87 - 5.11 MIL/uL Final   Hemoglobin 03/08/2022 12.6  12.0 - 15.0 g/dL Final   HCT 02/54/2706 38.2  36.0 - 46.0 % Final   MCV 03/08/2022 92.7  80.0 - 100.0 fL Final   MCH 03/08/2022 30.6  26.0 - 34.0 pg Final   MCHC 03/08/2022 33.0  30.0 - 36.0 g/dL Final   RDW 23/76/2831 12.8  11.5 - 15.5 % Final   Platelets 03/08/2022 276  150 - 400 K/uL Final   nRBC 03/08/2022 0.0  0.0 - 0.2 % Final   Neutrophils Relative % 03/08/2022 32  % Final   Neutro Abs 03/08/2022 2.3  1.7 - 7.7 K/uL Final   Lymphocytes Relative 03/08/2022 58  % Final   Lymphs Abs 03/08/2022 4.2 (H)  0.7 - 4.0 K/uL Final   Monocytes Relative 03/08/2022 6  % Final   Monocytes Absolute 03/08/2022 0.5  0.1 - 1.0 K/uL Final   Eosinophils Relative 03/08/2022 2  % Final   Eosinophils Absolute 03/08/2022 0.2  0.0 - 0.5 K/uL Final   Basophils Relative 03/08/2022 1  % Final   Basophils Absolute 03/08/2022 0.1  0.0 - 0.1 K/uL Final   Immature Granulocytes 03/08/2022 1  % Final   Abs Immature Granulocytes 03/08/2022 0.04  0.00 - 0.07 K/uL Final   Performed at [facility], [address] [place], [place] [address]   I-stat hCG, quantitative 03/08/2022 <5.0  <5 mIU/mL Final   Performed at [facility], [address] [place], [place] [address]   SARS Coronavirus 2 by RT PCR 03/08/2022 NEGATIVE  NEGATIVE Final   Comment: (NOTE) SARS-CoV-2 target nucleic acids are NOT DETECTED.  The SARS-CoV-2 RNA is generally detectable in upper  respiratory specimens during the acute phase of infection. The lowest concentration of SARS-CoV-2 viral copies this assay can detect is 138 copies/mL. A negative result does not preclude SARS-Cov-2 infection and should not be used as the sole basis for treatment or other patient management decisions. A negative result may occur with  improper specimen collection/handling, submission of specimen other than nasopharyngeal swab, presence of viral mutation(s) within the areas targeted by this assay, and inadequate number of viral copies(<138 copies/mL). A negative result must be combined with clinical observations, patient history, and epidemiological information. The expected result is Negative.  Fact Sheet for Patients:  BloggerCourse.com  Fact Sheet for Healthcare Providers:  SeriousBroker.it  This test is no                          t yet approved or cleared by the [place]  FDA and  has been authorized for detection and/or diagnosis of SARS-CoV-2 by FDA under an Emergency Use Authorization (EUA). This EUA will remain  in effect (meaning this test can be used) for the duration of the COVID-19 declaration under Section 564(b)(1) of the Act, 21 U.S.C.section 360bbb-3(b)(1), unless the authorization is terminated  or revoked sooner.       Influenza A by PCR 03/08/2022 NEGATIVE  NEGATIVE Final   Influenza B by PCR 03/08/2022 NEGATIVE  NEGATIVE Final   Comment: (NOTE) The Xpert Xpress SARS-CoV-2/FLU/RSV plus assay is intended as an aid in the diagnosis of influenza from Nasopharyngeal swab specimens and should not be used as a sole basis for treatment. Nasal washings and aspirates are unacceptable for Xpert Xpress SARS-CoV-2/FLU/RSV testing.  Fact Sheet for Patients: EntrepreneurPulse.com.au  Fact Sheet for Healthcare Providers: IncredibleEmployment.be  This test is not yet approved or  cleared by the [place] FDA and has been authorized for detection and/or diagnosis of SARS-CoV-2 by FDA under an Emergency Use Authorization (EUA). This EUA will remain in effect (meaning this test can be used) for the duration of the COVID-19 declaration under Section 564(b)(1) of the Act, 21 U.S.C. section 360bbb-3(b)(1), unless the authorization is terminated or revoked.  Performed at [facility], [place] [address], [place], [place] [address]   Admission on 03/06/2022, Discharged on 03/07/2022  Component Date Value Ref Range Status   Sodium 03/06/2022 140  135 - 145 mmol/L Final   Potassium 03/06/2022 3.7  3.5 - 5.1 mmol/L Final   Chloride 03/06/2022 106  98 - 111 mmol/L Final   CO2 03/06/2022 26  22 - 32 mmol/L Final   Glucose, Bld 03/06/2022 121 (H)  70 - 99 mg/dL Final   Glucose reference range applies only to samples taken after fasting for at least 8 hours.   BUN 03/06/2022 7  6 - 20 mg/dL Final   Creatinine, Ser 03/06/2022 0.49  0.44 - 1.00 mg/dL Final   Calcium 03/06/2022 9.1  8.9 - 10.3 mg/dL Final   Total Protein 03/06/2022 7.2  6.5 - 8.1 g/dL Final   Albumin 03/06/2022 4.0  3.5 - 5.0 g/dL Final   AST 03/06/2022 18  15 - 41 U/L Final   ALT 03/06/2022 22  0 - 44 U/L Final   Alkaline Phosphatase 03/06/2022 81  38 - 126 U/L Final   Total Bilirubin 03/06/2022 0.5  0.3 - 1.2 mg/dL Final   GFR, Estimated 03/06/2022 >60  >60 mL/min Final   Comment: (NOTE) Calculated using the CKD-EPI Creatinine Equation (2021)    Anion gap 03/06/2022 8  5 - 15 Final   Performed at [facility], [place] [address] [place], [place] [address]   WBC 03/06/2022 8.5  4.0 - 10.5 K/uL Final   RBC 03/06/2022 4.82  3.87 - 5.11 MIL/uL Final   Hemoglobin 03/06/2022 14.4  12.0 - 15.0 g/dL Final   HCT 03/06/2022 44.5  36.0 - 46.0 % Final   MCV 03/06/2022 92.3  80.0 - 100.0 fL Final   MCH 03/06/2022 29.9  26.0 - 34.0 pg Final   MCHC 03/06/2022 32.4  30.0 - 36.0 g/dL  Final   RDW 03/06/2022 12.6  11.5 - 15.5 % Final   Platelets 03/06/2022 314  150 - 400 K/uL Final   nRBC 03/06/2022 0.0  0.0 - 0.2 % Final   Neutrophils Relative % 03/06/2022 47  % Final   Neutro Abs 03/06/2022 4.1  1.7 - 7.7 K/uL Final  Lymphocytes Relative 03/06/2022 43  % Final   Lymphs Abs 03/06/2022 3.6  0.7 - 4.0 K/uL Final   Monocytes Relative 03/06/2022 7  % Final   Monocytes Absolute 03/06/2022 0.6  0.1 - 1.0 K/uL Final   Eosinophils Relative 03/06/2022 2  % Final   Eosinophils Absolute 03/06/2022 0.2  0.0 - 0.5 K/uL Final   Basophils Relative 03/06/2022 0  % Final   Basophils Absolute 03/06/2022 0.0  0.0 - 0.1 K/uL Final   Immature Granulocytes 03/06/2022 1  % Final   Abs Immature Granulocytes 03/06/2022 0.04  0.00 - 0.07 K/uL Final   Performed at [facility], [address], [place], [place] [address]   Troponin I (High Sensitivity) 03/06/2022 <2  <18 ng/L Final   Comment: (NOTE) Elevated high sensitivity troponin I (hsTnI) values and significant  changes across serial measurements may suggest ACS but many other  chronic and acute conditions are known to elevate hsTnI results.  Refer to the "Links" section for chest pain algorithms and additional  guidance. Performed at [facility], [address], [place], [place] [address]    I-stat hCG, quantitative 03/06/2022 <5.0  <5 mIU/mL Final   Comment 3 03/06/2022          Final   Comment:   GEST. AGE      CONC.  (mIU/mL)   <=1 WEEK        5 - 50     2 WEEKS       50 - 500     3 WEEKS       100 - 10,000     4 WEEKS     1,000 - 30,000        FEMALE AND NON-PREGNANT FEMALE:     LESS THAN 5 mIU/mL    Color, Urine 03/06/2022 YELLOW  YELLOW Final   APPearance 03/06/2022 HAZY (A)  CLEAR Final   Specific Gravity, Urine 03/06/2022 1.015  1.005 - 1.030 Final   pH 03/06/2022 5.0  5.0 - 8.0 Final   Glucose, UA 03/06/2022 NEGATIVE  NEGATIVE mg/dL Final   Hgb urine dipstick 03/06/2022 NEGATIVE   NEGATIVE Final   Bilirubin Urine 03/06/2022 NEGATIVE  NEGATIVE Final   Ketones, ur 03/06/2022 NEGATIVE  NEGATIVE mg/dL Final   Protein, ur 09/81/1914 NEGATIVE  NEGATIVE mg/dL Final   Nitrite 78/29/5621 NEGATIVE  NEGATIVE Final   Leukocytes,Ua 03/06/2022 LARGE (A)  NEGATIVE Final   RBC / HPF 03/06/2022 0-5  0 - 5 RBC/hpf Final   WBC, UA 03/06/2022 11-20  0 - 5 WBC/hpf Final   Bacteria, UA 03/06/2022 NONE SEEN  NONE SEEN Final   Squamous Epithelial / LPF 03/06/2022 6-10  0 - 5 Final   Mucus 03/06/2022 PRESENT   Final   Performed at [facility], [address] [place], [place] [address]   Lipase 03/06/2022 30  11 - 51 U/L Final   Performed at [facility], [address], [place], [place] [address]   Opiates 03/06/2022 NONE DETECTED  NONE DETECTED Final   Cocaine 03/06/2022 NONE DETECTED  NONE DETECTED Final   Benzodiazepines 03/06/2022 NONE DETECTED  NONE DETECTED Final   Amphetamines 03/06/2022 NONE DETECTED  NONE DETECTED Final   Tetrahydrocannabinol 03/06/2022 NONE DETECTED  NONE DETECTED Final   Barbiturates 03/06/2022 NONE DETECTED  NONE DETECTED Final   Comment: (NOTE) DRUG SCREEN FOR MEDICAL PURPOSES ONLY.  IF CONFIRMATION IS NEEDED FOR ANY PURPOSE, NOTIFY LAB WITHIN 5 DAYS.  LOWEST DETECTABLE LIMITS FOR URINE DRUG SCREEN Drug Class                     Cutoff (ng/mL) Amphetamine and metabolites    1000 Barbiturate and metabolites    200 Benzodiazepine                 200 Tricyclics and metabolites     300 Opiates and metabolites        300 Cocaine and metabolites        300 THC                            50 Performed at [facility], [address], [place], [place] [address]   Admission on 01/17/2022, Discharged on 01/18/2022  Component Date Value Ref Range Status   Lipase 01/18/2022 36  11 - 51 U/L Final   Performed at [facility], [address], [place], [place] [address]   Sodium  01/18/2022 140  135 - 145 mmol/L Final   Potassium 01/18/2022 3.4 (L)  3.5 - 5.1 mmol/L Final   Chloride 01/18/2022 110  98 - 111 mmol/L Final   CO2 01/18/2022 25  22 - 32 mmol/L Final   Glucose, Bld 01/18/2022 92  70 - 99 mg/dL Final   Glucose reference range applies only to samples taken after fasting for at least 8 hours.   BUN 01/18/2022 9  6 - 20 mg/dL Final   Creatinine, Ser 01/18/2022 0.50  0.44 - 1.00 mg/dL Final   Calcium 09/81/1914 8.9  8.9 - 10.3 mg/dL Final   Total Protein 78/29/5621 7.3  6.5 - 8.1 g/dL Final   Albumin 30/86/5784 3.9  3.5 - 5.0 g/dL Final   AST 69/62/9528 18  15 - 41 U/L Final   ALT 01/18/2022 17  0 - 44 U/L Final   Alkaline Phosphatase 01/18/2022 101  38 - 126 U/L Final   Total Bilirubin 01/18/2022 0.9  0.3 - 1.2 mg/dL Final   GFR, Estimated 01/18/2022 >60  >60 mL/min Final   Comment: (NOTE) Calculated using the CKD-EPI Creatinine Equation (2021)    Anion gap 01/18/2022 5  5 - 15 Final   Performed at [facility], [address] [place], [place] [address]   WBC 01/18/2022 7.6  4.0 - 10.5 K/uL Final   RBC 01/18/2022 4.67  3.87 - 5.11 MIL/uL Final   Hemoglobin 01/18/2022 14.3  12.0 - 15.0 g/dL Final   HCT 40/05/2724 43.3  36.0 - 46.0 % Final   MCV 01/18/2022 92.7  80.0 - 100.0 fL Final   MCH 01/18/2022 30.6  26.0 - 34.0 pg Final   MCHC 01/18/2022 33.0  30.0 - 36.0 g/dL Final   RDW 36/64/4034 14.3  11.5 - 15.5 % Final   Platelets 01/18/2022 282  150 - 400 K/uL Final   nRBC 01/18/2022 0.0  0.0 - 0.2 % Final   Performed at [facility], [address] [place], [place] [address]   Color, Urine 01/18/2022 YELLOW  YELLOW Final   APPearance 01/18/2022 HAZY (A)  CLEAR Final   Specific Gravity, Urine 01/18/2022 1.020  1.005 - 1.030 Final   pH 01/18/2022 5.0  5.0 - 8.0 Final   Glucose, UA 01/18/2022 NEGATIVE  NEGATIVE mg/dL Final   Hgb urine dipstick 01/18/2022 NEGATIVE  NEGATIVE Final   Bilirubin Urine 01/18/2022  NEGATIVE  NEGATIVE Final   Ketones,  ur 01/18/2022 NEGATIVE  NEGATIVE mg/dL Final   Protein, ur 16/05/9603 NEGATIVE  NEGATIVE mg/dL Final   Nitrite 54/04/8118 NEGATIVE  NEGATIVE Final   Leukocytes,Ua 01/18/2022 LARGE (A)  NEGATIVE Final   RBC / HPF 01/18/2022 6-10  0 - 5 RBC/hpf Final   WBC, UA 01/18/2022 >50 (H)  0 - 5 WBC/hpf Final   Bacteria, UA 01/18/2022 RARE (A)  NONE SEEN Final   Squamous Epithelial / LPF 01/18/2022 11-20  0 - 5 Final   Mucus 01/18/2022 PRESENT   Final   Non Squamous Epithelial 01/18/2022 0-5 (A)  NONE SEEN Final   Performed at [facility], [address] [place], [place] [address]   Specimen Description 01/18/2022    Final                   Value:BLOOD LEFT ANTECUBITAL Performed at [facility], [address] [place], [place] [address]    Special Requests 01/18/2022    Final                   Value:BOTTLES DRAWN AEROBIC AND ANAEROBIC Blood Culture adequate volume Performed at [facility], [address] [place], [place] [address]    Culture 01/18/2022    Final                   Value:NO GROWTH 5 DAYS Performed at [facility], [address] [place], [place] [address]    Report Status 01/18/2022 01/23/2022 FINAL   Final   Specimen Description 01/18/2022    Final                   Value:BLOOD BLOOD LEFT HAND Performed at [facility], [address], [place], [place] [address]    Special Requests 01/18/2022    Final                   Value:BOTTLES DRAWN AEROBIC AND ANAEROBIC Blood Culture adequate volume Performed at [facility], [address] [place], [place] [address]    Culture 01/18/2022    Final                   Value:NO GROWTH 5 DAYS Performed at [facility], [address], [place], [place] [address]    Report Status 01/18/2022 01/23/2022 FINAL   Final   Lactic Acid, Venous 01/18/2022 1.4  0.5 - 1.9 mmol/L Final   Performed at  [facility], [address], [place], [place] [address]   Specimen Description 01/18/2022    Final                   Value:URINE, CLEAN CATCH Performed at [facility], [address] [place] [address], [place], [place] [address]    Special Requests 01/18/2022    Final                   Value:NONE Performed at [facility], [address] [place], [place] [address]    Culture 01/18/2022  (A)   Final                   Value:<10,000 COLONIES/mL INSIGNIFICANT GROWTH Performed at [facility], [address], [place], [place] [address]    Report Status 01/18/2022 01/19/2022 FINAL   Final   Opiates 01/18/2022 NONE DETECTED  NONE DETECTED Final   Cocaine 01/18/2022  POSITIVE (A)  NONE DETECTED Final   Benzodiazepines 01/18/2022 NONE DETECTED  NONE DETECTED Final   Amphetamines 01/18/2022 NONE DETECTED  NONE DETECTED Final   Tetrahydrocannabinol 01/18/2022 NONE DETECTED  NONE DETECTED Final   Barbiturates 01/18/2022 NONE DETECTED  NONE DETECTED Final   Comment: (NOTE) DRUG SCREEN FOR MEDICAL PURPOSES ONLY.  IF CONFIRMATION IS NEEDED FOR ANY PURPOSE, NOTIFY LAB WITHIN 5 DAYS.  LOWEST DETECTABLE LIMITS FOR URINE DRUG SCREEN Drug Class                     Cutoff (ng/mL) Amphetamine and metabolites    1000 Barbiturate and metabolites    200 Benzodiazepine                 200 Tricyclics and metabolites     300 Opiates and metabolites        300 Cocaine and metabolites        300 THC                            50 Performed at [facility], [address] [place], [place] [address]    Alcohol, Ethyl (B) 01/18/2022 <10  <10 mg/dL Final   Comment: (NOTE) Lowest detectable limit for serum alcohol is 10 mg/dL.  For medical purposes only. Performed at [facility], [address] [place], [place] [address]    HIV-1 P24 Antigen - HIV24 01/18/2022 NON REACTIVE  NON REACTIVE Final   Comment: RESULT CALLED  TO, READ BACK BY AND VERIFIED WITH: GRIFFIN,M RN ON 01/18/22 @ 1359 BY GOLSONM RESULT CALLED TO, READ BACK BY AND VERIFIED WITH: GRIFFIN,M RN ON 01/18/22 @ 1359 BY GOLSONM (NOTE) Detection of p24 may be inhibited by biotin in the sample, causing false negative results in acute infection.    HIV 1/2 Antibodies 01/18/2022 NON REACTIVE  NON REACTIVE Final   Interpretation (HIV Ag Ab) 01/18/2022 A non reactive test result means that HIV 1 or HIV 2 antibodies and HIV 1 p24 antigen were not detected in the specimen.   Final   Performed at [facility], [address] [place], [place] [id number]  Admission on 01/13/2022, Discharged on 01/14/2022  Component Date Value Ref Range Status   Sodium 01/13/2022 138  135 - 145 mmol/L Final   Potassium 01/13/2022 3.2 (L)  3.5 - 5.1 mmol/L Final   DELTA CHECK NOTED   Chloride 01/13/2022 103  98 - 111 mmol/L Final   CO2 01/13/2022 26  22 - 32 mmol/L Final   Glucose, Bld 01/13/2022 90  70 - 99 mg/dL Final   Glucose reference range applies only to samples taken after fasting for at least 8 hours.   BUN 01/13/2022 9  6 - 20 mg/dL Final   Creatinine, Ser 01/13/2022 0.68  0.44 - 1.00 mg/dL Final   Calcium 91/47/829506/07/2022 8.6 (L)  8.9 - 10.3 mg/dL Final   Total Protein 62/13/086506/07/2022 6.5  6.5 - 8.1 g/dL Final   Albumin 78/46/962906/07/2022 3.7  3.5 - 5.0 g/dL Final   AST 52/84/132406/07/2022 22  15 - 41 U/L Final   ALT 01/13/2022 21  0 - 44 U/L Final   Alkaline Phosphatase 01/13/2022 97  38 - 126 U/L Final   Total Bilirubin 01/13/2022 0.7  0.3 - 1.2 mg/dL Final   GFR, Estimated 01/13/2022 >60  >60 mL/min Final   Comment: (NOTE) Calculated using the CKD-EPI Creatinine Equation (2021)  Anion gap 01/13/2022 9  5 - 15 Final   Performed at [facility], [address] [place], [place] [address]   WBC 01/13/2022 6.8  4.0 - 10.5 K/uL Final   RBC 01/13/2022 4.28  3.87 - 5.11 MIL/uL Final   Hemoglobin 01/13/2022 12.7  12.0 - 15.0 g/dL Final   HCT 60/45/4098 38.2   36.0 - 46.0 % Final   MCV 01/13/2022 89.3  80.0 - 100.0 fL Final   MCH 01/13/2022 29.7  26.0 - 34.0 pg Final   MCHC 01/13/2022 33.2  30.0 - 36.0 g/dL Final   RDW 11/91/4782 13.4  11.5 - 15.5 % Final   Platelets 01/13/2022 294  150 - 400 K/uL Final   nRBC 01/13/2022 0.0  0.0 - 0.2 % Final   Neutrophils Relative % 01/13/2022 42  % Final   Neutro Abs 01/13/2022 2.8  1.7 - 7.7 K/uL Final   Lymphocytes Relative 01/13/2022 43  % Final   Lymphs Abs 01/13/2022 3.0  0.7 - 4.0 K/uL Final   Monocytes Relative 01/13/2022 10  % Final   Monocytes Absolute 01/13/2022 0.7  0.1 - 1.0 K/uL Final   Eosinophils Relative 01/13/2022 3  % Final   Eosinophils Absolute 01/13/2022 0.2  0.0 - 0.5 K/uL Final   Basophils Relative 01/13/2022 1  % Final   Basophils Absolute 01/13/2022 0.0  0.0 - 0.1 K/uL Final   Immature Granulocytes 01/13/2022 1  % Final   Abs Immature Granulocytes 01/13/2022 0.05  0.00 - 0.07 K/uL Final   Performed at [facility], [address], [place], [place] [address]  Admission on 01/13/2022, Discharged on 01/13/2022  Component Date Value Ref Range Status   Sodium 01/13/2022 141  135 - 145 mmol/L Final   Potassium 01/13/2022 3.9  3.5 - 5.1 mmol/L Final   DELTA CHECK NOTED   Chloride 01/13/2022 104  98 - 111 mmol/L Final   CO2 01/13/2022 30  22 - 32 mmol/L Final   Glucose, Bld 01/13/2022 120 (H)  70 - 99 mg/dL Final   Glucose reference range applies only to samples taken after fasting for at least 8 hours.   BUN 01/13/2022 5 (L)  6 - 20 mg/dL Final   Creatinine, Ser 01/13/2022 0.60  0.44 - 1.00 mg/dL Final   Calcium 30/86/5784 8.6 (L)  8.9 - 10.3 mg/dL Final   GFR, Estimated 01/13/2022 >60  >60 mL/min Final   Comment: (NOTE) Calculated using the CKD-EPI Creatinine Equation (2021)    Anion gap 01/13/2022 7  5 - 15 Final   Performed at [facility], [address] [place], [place] [address]   WBC 01/13/2022 8.2  4.0 - 10.5 K/uL Final   RBC 01/13/2022 4.70  3.87 - 5.11  MIL/uL Final   Hemoglobin 01/13/2022 14.6  12.0 - 15.0 g/dL Final   HCT 52/84/1324 42.5  36.0 - 46.0 % Final   MCV 01/13/2022 90.4  80.0 - 100.0 fL Final   MCH 01/13/2022 31.1  26.0 - 34.0 pg Final   MCHC 01/13/2022 34.4  30.0 - 36.0 g/dL Final   RDW 40/05/2724 13.3  11.5 - 15.5 % Final   Platelets 01/13/2022 304  150 - 400 K/uL Final   nRBC 01/13/2022 0.0  0.0 - 0.2 % Final   Performed at [facility], [address], [place], [place] [address]   Troponin I (High Sensitivity) 01/13/2022 3  <18 ng/L Final   Comment: (NOTE) Elevated high sensitivity  troponin I (hsTnI) values and significant  changes across serial measurements may suggest ACS but many other  chronic and acute conditions are known to elevate hsTnI results.  Refer to the Links section for chest pain algorithms and additional  guidance. Performed at [facility], [address] [place], [place] [address]    I-stat hCG, quantitative 01/13/2022 <5.0  <5 mIU/mL Final   Comment 3 01/13/2022          Final   Comment:   GEST. AGE      CONC.  (mIU/mL)   <=1 WEEK        5 - 50     2 WEEKS       50 - 500     3 WEEKS       100 - 10,000     4 WEEKS     1,000 - 30,000        FEMALE AND NON-PREGNANT FEMALE:     LESS THAN 5 mIU/mL    Troponin I (High Sensitivity) 01/13/2022 3  <18 ng/L Final   Comment: (NOTE) Elevated high sensitivity troponin I (hsTnI) values and significant  changes across serial measurements may suggest ACS but many other  chronic and acute conditions are known to elevate hsTnI results.  Refer to the "Links" section for chest pain algorithms and additional  guidance. Performed at [facility], [address] [place], [place] [address]   Admission on 01/11/2022, Discharged on 01/12/2022  Component Date Value Ref Range Status   Sodium 01/11/2022 138  135 - 145 mmol/L Final   Potassium 01/11/2022 3.2 (L)  3.5 - 5.1 mmol/L Final   Chloride 01/11/2022 101  98 - 111 mmol/L Final   CO2  01/11/2022 24  22 - 32 mmol/L Final   Glucose, Bld 01/11/2022 120 (H)  70 - 99 mg/dL Final   Glucose reference range applies only to samples taken after fasting for at least 8 hours.   BUN 01/11/2022 11  6 - 20 mg/dL Final   Creatinine, Ser 01/11/2022 0.67  0.44 - 1.00 mg/dL Final   Calcium 82/95/6213 8.9  8.9 - 10.3 mg/dL Final   GFR, Estimated 01/11/2022 >60  >60 mL/min Final   Comment: (NOTE) Calculated using the CKD-EPI Creatinine Equation (2021)    Anion gap 01/11/2022 13  5 - 15 Final   Performed at [facility], [address], [place], [place] [address]   WBC 01/11/2022 10.8 (H)  4.0 - 10.5 K/uL Final   RBC 01/11/2022 4.56  3.87 - 5.11 MIL/uL Final   Hemoglobin 01/11/2022 13.8  12.0 - 15.0 g/dL Final   HCT 84/69/6295 40.2  36.0 - 46.0 % Final   MCV 01/11/2022 88.2  80.0 - 100.0 fL Final   MCH 01/11/2022 30.3  26.0 - 34.0 pg Final   MCHC 01/11/2022 34.3  30.0 - 36.0 g/dL Final   RDW 28/41/3244 13.3  11.5 - 15.5 % Final   Platelets 01/11/2022 311  150 - 400 K/uL Final   nRBC 01/11/2022 0.0  0.0 - 0.2 % Final   Performed at [facility], [address], [place], [place] [address]   Troponin I (High Sensitivity) 01/11/2022 4  <18 ng/L Final   Comment: (NOTE) Elevated high sensitivity troponin I (hsTnI) values and significant  changes across serial measurements may suggest ACS but many other  chronic and acute conditions are known to elevate hsTnI results.  Refer to the "Links" section  for chest pain algorithms and additional  guidance. Performed at [facility], [address] [place], [place] [address]   Admission on 01/06/2022, Discharged on 01/07/2022  Component Date Value Ref Range Status   Color, Urine 01/07/2022 STRAW (A)  YELLOW Final   APPearance 01/07/2022 CLEAR  CLEAR Final   Specific Gravity, Urine 01/07/2022 1.045 (H)  1.005 - 1.030 Final   pH 01/07/2022 6.0  5.0 - 8.0 Final   Glucose, UA 01/07/2022 NEGATIVE  NEGATIVE mg/dL Final   Hgb urine  dipstick 01/07/2022 NEGATIVE  NEGATIVE Final   Bilirubin Urine 01/07/2022 NEGATIVE  NEGATIVE Final   Ketones, ur 01/07/2022 NEGATIVE  NEGATIVE mg/dL Final   Protein, ur 60/45/4098 NEGATIVE  NEGATIVE mg/dL Final   Nitrite 11/91/4782 NEGATIVE  NEGATIVE Final   Leukocytes,Ua 01/07/2022 LARGE (A)  NEGATIVE Final   RBC / HPF 01/07/2022 0-5  0 - 5 RBC/hpf Final   WBC, UA 01/07/2022 6-10  0 - 5 WBC/hpf Final   Bacteria, UA 01/07/2022 NONE SEEN  NONE SEEN Final   Squamous Epithelial / LPF 01/07/2022 0-5  0 - 5 Final   Mucus 01/07/2022 PRESENT   Final   Performed at [facility], [address], [place], [place] [address]   WBC 01/07/2022 7.6  4.0 - 10.5 K/uL Final   RBC 01/07/2022 4.27  3.87 - 5.11 MIL/uL Final   Hemoglobin 01/07/2022 13.0  12.0 - 15.0 g/dL Final   HCT 30/86/5784 38.8  36.0 - 46.0 % Final   MCV 01/07/2022 90.9  80.0 - 100.0 fL Final   MCH 01/07/2022 30.4  26.0 - 34.0 pg Final   MCHC 01/07/2022 33.5  30.0 - 36.0 g/dL Final   RDW 69/62/9528 13.3  11.5 - 15.5 % Final   Platelets 01/07/2022 230  150 - 400 K/uL Final   nRBC 01/07/2022 0.0  0.0 - 0.2 % Final   Performed at [facility], [address], [place], [place] [address]   Sodium 01/07/2022 142  135 - 145 mmol/L Final   Potassium 01/07/2022 3.9  3.5 - 5.1 mmol/L Final   Chloride 01/07/2022 107  98 - 111 mmol/L Final   CO2 01/07/2022 29  22 - 32 mmol/L Final   Glucose, Bld 01/07/2022 92  70 - 99 mg/dL Final   Glucose reference range applies only to samples taken after fasting for at least 8 hours.   BUN 01/07/2022 13  6 - 20 mg/dL Final   Creatinine, Ser 01/07/2022 0.49  0.44 - 1.00 mg/dL Final   Calcium 40/05/2724 8.9  8.9 - 10.3 mg/dL Final   Total Protein 36/64/4034 6.4 (L)  6.5 - 8.1 g/dL Final   Albumin 74/25/9563 3.6  3.5 - 5.0 g/dL Final   AST 87/56/4332 19  15 - 41 U/L Final   ALT 01/07/2022 19  0 - 44 U/L Final   Alkaline Phosphatase 01/07/2022 121  38 - 126 U/L Final    Total Bilirubin 01/07/2022 0.4  0.3 - 1.2 mg/dL Final   GFR, Estimated 01/07/2022 >60  >60 mL/min Final   Comment: (NOTE) Calculated using the CKD-EPI Creatinine Equation (2021)    Anion gap 01/07/2022 6  5 - 15 Final   Performed at [facility], [address], [place], [place] [address]   Troponin I (High Sensitivity) 01/07/2022 <2  <18 ng/L Final   Comment: (NOTE) Elevated high sensitivity troponin I (hsTnI) values and significant  changes across serial measurements may suggest ACS  but many other  chronic and acute conditions are known to elevate hsTnI results.  Refer to the "Links" section for chest pain algorithms and additional  guidance. Performed at [facility], [address] [place], [place] [address]    Alcohol, Ethyl (B) 01/07/2022 <10  <10 mg/dL Final   Comment: (NOTE) Lowest detectable limit for serum alcohol is 10 mg/dL.  For medical purposes only. Performed at [facility], [address], [place], [place] [address]   There may be more visits with results that are not included.    Allergies: Patient has no known allergies.  PTA Medications: (Not in a hospital admission)   Long Term Goals: Improvement in symptoms so as ready for discharge  Short Term Goals: Patient will verbalize feelings in meetings with treatment team members., Patient will attend at least of 50% of the groups daily., Pt will complete the PHQ9 on admission, day 3 and discharge., Patient will participate in completing the [place] Suicide Severity Rating Scale, Patient will score a low risk of violence for 24 hours prior to discharge, and Patient will take medications as prescribed daily.  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 Lucianne Muss. Patient meets facility based crisis criteria for treatment of substance and co-existing acute psychiatric disorder. Patient is voluntary and  currently voicing only passive SI without a plan or intent for suicide. UDS +methamphetamines. Ethanol level was not obtained by, requesting for lab to add from serum collected this morning if possible. Restarted previously prescribed Lexapro for depression and Abilify for mood stabilization. CIWA protocol and agitation medications ordered as PRN.   2. Chronic anticoagulation, restart Rivaroxaban 20 mg daily.  Patient has history of recurrent pulmonary embolism and DVT.  GFR >60.  Recommendations  Based on my evaluation the patient does not appear to have an emergency medical condition.Patient is being transferred to facility based crisis for detox and medication stabilization.  Joaquin Courts, FNP 05/08/22  10:42 AM

## 2022-05-08 NOTE — BH Assessment (Signed)
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 Patient denied HI and psychosis. Patient is currently homeless. Patient reports using alcohol 2 hours ago and admitted to drinking daily. Patient does not know how much she drinks, stating "whatever I can get or what they will give me". 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 Patient reports panhandling to buy alcohol and drugs. Patient unable to recall information at times. Patient was cooperative during assessment.

## 2022-05-08 NOTE — ED Notes (Signed)
Pt pleasant and cooperative with staff this am. No acute distress noted. Denies SI/HI/AVH. Took am medication without difficulty. Denies withdrawal sx from ETOH at present. Informed pt to notify staff with any needs or concerns. Verbalized understanding and agreement. Will continue to monitor for safety.

## 2022-05-08 NOTE — ED Provider Notes (Signed)
[facility] Continuous Assessment Admission H&P  Date: 05/08/22 Patient Name: Loray Akard MRN: [id number] Chief Complaint: No chief complaint on file.     Diagnoses:  Final diagnoses:  Alcohol abuse with intoxication (HCC)  Polysubstance abuse (HCC)  Insomnia, unspecified type  Homelessness    HPI: [place],  56 y.o female with a history of bipolar disorder, cocaine abuse, anxiety, alcohol abuse, homelessness, increased depression with psychotic cholestasis.  Presented to [facility] voluntarily.  Per the patient she is suicidal and also she wants detox.  Patient is a frequent flyer to the ED patient was last hospitalized at [facility] each on 8/17.  According to the patient just she drank alcohol and use drugs prior to coming in tonight,  she state she is not sure what all was in the just but cocaine and maybe fentanyl being evaluated.  Per the patient I do not care anymore, patient denies having a current psychiatrist or seeing a therapist.  According to the patient taking her medicines and cannot tell me when the last time she took it.  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  Face-to-face observation of patient, patient is alert and oriented x 4, speech is clear, maintained minimal eye contact.  Patient intoxicated, patient mood is labile affect is congruent with mood. Patient reports she is suicidal, but does not have a plan.  Patient stated I do not care anymore what happened.  PatientdeniesHI report auditory hallucinations sometimes but do not know what they are.   Recommend inpatient observation with follow-up evaluation for [facility] bed becomes available.  PHQ 2-9:  Flowsheet Row Office Visit from 03/12/2021 in [facility] 1  Thoughts that you would be better off dead, or of hurting yourself in some way Not at all  PHQ-9 Total Score 22       Flowsheet Row ED from 03/20/2022 in [facility] EMERGENCY [facility] ED to Hosp-Admission (Discharged) [facility] from 03/07/2022 in [facility]  C-SSRS RISK CATEGORY No Risk No Risk No Risk        Total Time spent with patient: 20 minutes  Musculoskeletal  Strength & Muscle Tone: within normal limits Gait & Station: normal Patient leans: N/A  Psychiatric Specialty Exam  Presentation General Appearance:  Disheveled  Eye Contact: Fair  Speech: Clear and Coherent  Speech Volume: Normal  Handedness: Right   Mood and Affect  Mood: Anxious; Euphoric  Affect: Congruent   Thought Process  Thought Processes: Linear  Descriptions of Associations:Intact  Orientation:Full (Time, Place and Person)  Thought Content:Logical  Diagnosis of Schizophrenia or Schizoaffective disorder in past: No   Hallucinations:Hallucinations: Auditory  Ideas of Reference:None  Suicidal Thoughts:Suicidal Thoughts: Yes, Passive SI Passive Intent and/or Plan: Without Plan  Homicidal Thoughts:Homicidal Thoughts: No   Sensorium  Memory: Immediate Fair  Judgment: Poor  Insight: Poor   Executive Functions  Concentration: Fair  Attention Span: Fair  Recall: [employer] of Knowledge: Fair  Language: Fair   Psychomotor Activity  Psychomotor Activity: Psychomotor Activity: Normal   Assets  Assets: Housing; Desire for Improvement   Sleep  Sleep: Sleep: Poor Number of Hours of Sleep: 2   Nutritional Assessment (For OBS and FBC admissions only) Has the patient had a weight loss or gain of 10 pounds or more in the last 3 months?: No Has the  patient had a decrease in food intake/or appetite?: Yes Does the patient have dental problems?: No Does the patient have eating habits or behaviors that may be indicators of an eating disorder including binging or inducing vomiting?: No Has the patient recently lost weight  without trying?: 0 Has the patient been eating poorly because of a decreased appetite?: 0 Malnutrition Screening Tool Score: 0    Physical Exam HENT:     Head: Normocephalic.     Nose: Nose normal.  Cardiovascular:     Rate and Rhythm: Normal rate.  Musculoskeletal:        General: Normal range of motion.     Cervical back: Normal range of motion.  Skin:    General: Skin is warm.  Neurological:     General: No focal deficit present.     Mental Status: She is alert.  Psychiatric:        Mood and Affect: Mood normal.        Behavior: Behavior normal.        Thought Content: Thought content normal.        Judgment: Judgment normal.    Review of Systems  Constitutional: Negative.   HENT: Negative.    Eyes: Negative.   Respiratory: Negative.    Cardiovascular: Negative.   Skin: Negative.   Neurological: Negative.   Endo/Heme/Allergies: Negative.   Psychiatric/Behavioral:  Positive for depression, substance abuse and suicidal ideas. The patient is nervous/anxious and has insomnia.     Blood pressure 110/86, pulse 80, temperature 98.3 F (36.8 C), temperature source Oral, resp. rate 18, SpO2 98 %. There is no height or weight on file to calculate BMI.  Past Psychiatric History: Bipolar disorder, alcohol abuse, polysubstance abuse, homelessness, anxiety  Is the patient at risk to self? Yes  Has the patient been a risk to self in the past 6 months? Yes .    Has the patient been a risk to self within the distant past? Yes   Is the patient a risk to others? No   Has the patient been a risk to others in the past 6 months? No   Has the patient been a risk to others within the distant past? No   Past Medical History:  Past Medical History:  Diagnosis Date   Anxiety    Depression    DVT (deep vein thrombosis) in pregnancy    GERD (gastroesophageal reflux disease)    Ovarian cyst     Past Surgical History:  Procedure Laterality Date   APPENDECTOMY  age 74   IR PTA  VENOUS EXCEPT DIALYSIS CIRCUIT  01/16/2021   IR RADIOLOGIST EVAL & MGMT  03/19/2021   IR THROMBECT VENO MECH MOD SED  01/16/2021   IR US GUIDE VASC ACCESS LEFT  01/16/2021   IR VENO/EXT/UNI LEFT  01/16/2021   IR VENOCAVAGRAM IVC  01/16/2021   TONSILLECTOMY Bilateral age 64    Family History:  Family History  Problem Relation Age of Onset   Asthma Mother    COPD Mother    Cancer Father        thyroid cancer    Social History:  Social History   Socioeconomic History   Marital status: Divorced    Spouse name: Not on file   Number of children: Not on file   Years of education: Not on file   Highest education level: Not on file  Occupational History   Not on file  Tobacco Use   Smoking status: Every Day  Packs/day: 0.50    Years: 35.00    Total pack years: 17.50    Types: Cigarettes   Smokeless tobacco: Never  Vaping Use   Vaping Use: Never used  Substance and Sexual Activity   Alcohol use: Yes   Drug use: Yes    Types: "Crack" cocaine, Cocaine, Methamphetamines    Comment: last used 4/31/22   Sexual activity: Not on file  Other Topics Concern   Not on file  Social History Narrative   Not on file   Social Determinants of Health   Financial Resource Strain: Not on file  Food Insecurity: Not on file  Transportation Needs: Not on file  Physical Activity: Not on file  Stress: Not on file  Social Connections: Not on file  Intimate Partner Violence: Not on file    SDOH:  SDOH Screenings   Alcohol Screen: High Risk (03/10/2022)  Depression (PHQ2-9): High Risk (03/12/2021)  Tobacco Use: High Risk (03/20/2022)    Last Labs:  Admission on 03/20/2022, Discharged on 03/20/2022  Component Date Value Ref Range Status   WBC 03/20/2022 9.3  4.0 - 10.5 K/uL Final   RBC 03/20/2022 4.76  3.87 - 5.11 MIL/uL Final   Hemoglobin 03/20/2022 14.2  12.0 - 15.0 g/dL Final   HCT 16/05/9603 44.0  36.0 - 46.0 % Final   MCV 03/20/2022 92.4  80.0 - 100.0 fL Final   MCH 03/20/2022 29.8   26.0 - 34.0 pg Final   MCHC 03/20/2022 32.3  30.0 - 36.0 g/dL Final   RDW 54/04/8118 12.4  11.5 - 15.5 % Final   Platelets 03/20/2022 286  150 - 400 K/uL Final   nRBC 03/20/2022 0.0  0.0 - 0.2 % Final   Neutrophils Relative % 03/20/2022 56  % Final   Neutro Abs 03/20/2022 5.3  1.7 - 7.7 K/uL Final   Lymphocytes Relative 03/20/2022 32  % Final   Lymphs Abs 03/20/2022 3.0  0.7 - 4.0 K/uL Final   Monocytes Relative 03/20/2022 8  % Final   Monocytes Absolute 03/20/2022 0.7  0.1 - 1.0 K/uL Final   Eosinophils Relative 03/20/2022 2  % Final   Eosinophils Absolute 03/20/2022 0.2  0.0 - 0.5 K/uL Final   Basophils Relative 03/20/2022 1  % Final   Basophils Absolute 03/20/2022 0.1  0.0 - 0.1 K/uL Final   Immature Granulocytes 03/20/2022 1  % Final   Abs Immature Granulocytes 03/20/2022 0.06  0.00 - 0.07 K/uL Final   Performed at [facility], [address] [place], [place] [address]   Sodium 03/20/2022 140  135 - 145 mmol/L Final   Potassium 03/20/2022 4.2  3.5 - 5.1 mmol/L Final   Chloride 03/20/2022 106  98 - 111 mmol/L Final   CO2 03/20/2022 25  22 - 32 mmol/L Final   Glucose, Bld 03/20/2022 89  70 - 99 mg/dL Final   Glucose reference range applies only to samples taken after fasting for at least 8 hours.   BUN 03/20/2022 17  6 - 20 mg/dL Final   Creatinine, Ser 03/20/2022 0.64  0.44 - 1.00 mg/dL Final   Calcium 95/62/1308 9.2  8.9 - 10.3 mg/dL Final   Total Protein 65/78/4696 7.1  6.5 - 8.1 g/dL Final   Albumin 29/52/8413 4.1  3.5 - 5.0 g/dL Final   AST 24/40/1027 18  15 - 41 U/L Final   ALT 03/20/2022 26  0 - 44 U/L Final   Alkaline Phosphatase 03/20/2022 86  38 - 126 U/L  Final   Total Bilirubin 03/20/2022 0.4  0.3 - 1.2 mg/dL Final   GFR, Estimated 03/20/2022 >60  >60 mL/min Final   Comment: (NOTE) Calculated using the CKD-EPI Creatinine Equation (2021)    Anion gap 03/20/2022 9  5 - 15 Final   Performed at [facility], [address], [place], [place] [address]    Color, Urine 03/20/2022 STRAW (A)  YELLOW Final   APPearance 03/20/2022 CLEAR  CLEAR Final   Specific Gravity, Urine 03/20/2022 1.008  1.005 - 1.030 Final   pH 03/20/2022 6.0  5.0 - 8.0 Final   Glucose, UA 03/20/2022 NEGATIVE  NEGATIVE mg/dL Final   Hgb urine dipstick 03/20/2022 NEGATIVE  NEGATIVE Final   Bilirubin Urine 03/20/2022 NEGATIVE  NEGATIVE Final   Ketones, ur 03/20/2022 NEGATIVE  NEGATIVE mg/dL Final   Protein, ur 01/25/7627 NEGATIVE  NEGATIVE mg/dL Final   Nitrite 31/51/7616 NEGATIVE  NEGATIVE Final   Leukocytes,Ua 03/20/2022 NEGATIVE  NEGATIVE Final   Performed at [facility], [address], [place], [place] [address]  Admission on 03/10/2022, Discharged on 03/20/2022  Component Date Value Ref Range Status   Magnesium 03/11/2022 1.9  1.7 - 2.4 mg/dL Final   Performed at [facility], [address] [place], [place] [address]   Hepatitis B Surface Ag 03/11/2022 NON REACTIVE  NON REACTIVE Final   HCV Ab 03/11/2022 NON REACTIVE  NON REACTIVE Final   Comment: (NOTE) Nonreactive HCV antibody screen is consistent with no HCV infections,  unless recent infection is suspected or other evidence exists to indicate HCV infection.     Hep A IgM 03/11/2022 NON REACTIVE  NON REACTIVE Final   Hep B C IgM 03/11/2022 NON REACTIVE  NON REACTIVE Final   Performed at [facility], [address], [place], [place] [address]   Vit D, 25-Hydroxy 03/11/2022 31.00  30 - 100 ng/mL Final   Comment: (NOTE) Vitamin D deficiency has been defined by the Institute of Medicine  and an Endocrine Society practice guideline as a level of serum 25-OH  vitamin D less than 20 ng/mL (1,2). The Endocrine Society went on to  further define vitamin D insufficiency as a level between 21 and 29  ng/mL (2).  1. IOM (Institute of Medicine). 2010. Dietary reference intakes for  calcium and D. [place]: [employer]. 2. Holick MF, Binkley Watson, Bischoff-Ferrari  HA, et al. Evaluation,  treatment, and prevention of vitamin D deficiency: an Endocrine  Society clinical practice guideline, JCEM. 2011 Jul; 96(7): 1911-30.  Performed at [facility], [address] [place], [place] [address]    Vitamin B-12 03/11/2022 201  180 - 914 pg/mL Final   Comment: (NOTE) This assay is not validated for testing neonatal or myeloproliferative syndrome specimens for Vitamin B12 levels. Performed at [facility], [address] [place], [place] [address]    TSH 03/11/2022 3.487  0.350 - 4.500 uIU/mL Final   Comment: Performed by a 3rd Generation assay with a functional sensitivity of <=0.01 uIU/mL. Performed at [facility], [address] [place], [place] [address]    RPR Ser Ql 03/11/2022 NON REACTIVE  NON REACTIVE Final   Performed at [facility], [address], [place], [place] [address]   Cholesterol 03/13/2022 194  0 - 200 mg/dL Final   Triglycerides 67/89/3810 53  <150 mg/dL Final   HDL 17/51/0258 54  >40 mg/dL Final   Total  CHOL/HDL Ratio 03/13/2022 3.6  RATIO Final   VLDL 03/13/2022 11  0 - 40 mg/dL Final   LDL Cholesterol 03/13/2022 129 (H)  0 - 99 mg/dL Final   Comment:        Total Cholesterol/HDL:CHD Risk Coronary Heart Disease Risk Table                     Men   Women  1/2 Average Risk   3.4   3.3  Average Risk       5.0   4.4  2 X Average Risk   9.6   7.1  3 X Average Risk  23.4   11.0        Use the calculated Patient Ratio above and the CHD Risk Table to determine the patient's CHD Risk.        ATP III CLASSIFICATION (LDL):  <100     mg/dL   Optimal  960-454  mg/dL   Near or Above                    Optimal  130-159  mg/dL   Borderline  098-119  mg/dL   High  >147     mg/dL   Very High Performed at [facility], [address] [place], [place] [address]    Hgb A1c MFr Bld 03/13/2022 5.1  4.8 - 5.6 % Final   Comment: (NOTE) Pre diabetes:           5.7%-6.4%  Diabetes:              >6.4%  Glycemic control for   <7.0% adults with diabetes    Mean Plasma Glucose 03/13/2022 99.67  mg/dL Final   Performed at [facility], [address], [place], [place] [address]   Sodium 03/14/2022 138  135 - 145 mmol/L Final   Potassium 03/14/2022 4.3  3.5 - 5.1 mmol/L Final   Chloride 03/14/2022 107  98 - 111 mmol/L Final   CO2 03/14/2022 26  22 - 32 mmol/L Final   Glucose, Bld 03/14/2022 89  70 - 99 mg/dL Final   Glucose reference range applies only to samples taken after fasting for at least 8 hours.   BUN 03/14/2022 17  6 - 20 mg/dL Final   Creatinine, Ser 03/14/2022 0.65  0.44 - 1.00 mg/dL Final   Calcium 65/78/4696 9.1  8.9 - 10.3 mg/dL Final   Total Protein 29/52/8413 6.5  6.5 - 8.1 g/dL Final   Albumin 24/40/1027 3.6  3.5 - 5.0 g/dL Final   AST 25/36/6440 14 (L)  15 - 41 U/L Final   ALT 03/14/2022 15  0 - 44 U/L Final   Alkaline Phosphatase 03/14/2022 71  38 - 126 U/L Final   Total Bilirubin 03/14/2022 0.3  0.3 - 1.2 mg/dL Final   GFR, Estimated 03/14/2022 >60  >60 mL/min Final   Comment: (NOTE) Calculated using the CKD-EPI Creatinine Equation (2021)    Anion gap 03/14/2022 5  5 - 15 Final   Performed at [facility], [address], [place], [place] [address]   Lipase 03/14/2022 32  11 - 51 U/L Final   Performed at [facility], [address], [place], [place] [address]   WBC 03/14/2022 8.8  4.0 - 10.5 K/uL Final   RBC 03/14/2022 4.37  3.87 - 5.11 MIL/uL Final   Hemoglobin 03/14/2022 13.2  12.0 - 15.0 g/dL Final   HCT  03/14/2022 39.9  36.0 - 46.0 % Final   MCV 03/14/2022 91.3  80.0 - 100.0 fL Final   MCH 03/14/2022 30.2  26.0 - 34.0 pg Final   MCHC 03/14/2022 33.1  30.0 - 36.0 g/dL Final   RDW 17/51/0258 12.4  11.5 - 15.5 % Final   Platelets 03/14/2022 230  150 - 400 K/uL Final   nRBC 03/14/2022 0.0  0.0 - 0.2 % Final   Neutrophils Relative % 03/14/2022 52  % Final   Neutro Abs  03/14/2022 4.7  1.7 - 7.7 K/uL Final   Lymphocytes Relative 03/14/2022 36  % Final   Lymphs Abs 03/14/2022 3.1  0.7 - 4.0 K/uL Final   Monocytes Relative 03/14/2022 9  % Final   Monocytes Absolute 03/14/2022 0.8  0.1 - 1.0 K/uL Final   Eosinophils Relative 03/14/2022 2  % Final   Eosinophils Absolute 03/14/2022 0.2  0.0 - 0.5 K/uL Final   Basophils Relative 03/14/2022 0  % Final   Basophils Absolute 03/14/2022 0.0  0.0 - 0.1 K/uL Final   Immature Granulocytes 03/14/2022 1  % Final   Abs Immature Granulocytes 03/14/2022 0.04  0.00 - 0.07 K/uL Final   Performed at [facility], [address], [place], [place] [address]   Color, Urine 03/14/2022 YELLOW  YELLOW Final   APPearance 03/14/2022 HAZY (A)  CLEAR Final   Specific Gravity, Urine 03/14/2022 1.012  1.005 - 1.030 Final   pH 03/14/2022 6.0  5.0 - 8.0 Final   Glucose, UA 03/14/2022 NEGATIVE  NEGATIVE mg/dL Final   Hgb urine dipstick 03/14/2022 NEGATIVE  NEGATIVE Final   Bilirubin Urine 03/14/2022 NEGATIVE  NEGATIVE Final   Ketones, ur 03/14/2022 NEGATIVE  NEGATIVE mg/dL Final   Protein, ur 24/23/5361 NEGATIVE  NEGATIVE mg/dL Final   Nitrite 44/31/5400 NEGATIVE  NEGATIVE Final   Leukocytes,Ua 03/14/2022 LARGE (A)  NEGATIVE Final   RBC / HPF 03/14/2022 0-5  0 - 5 RBC/hpf Final   WBC, UA 03/14/2022 21-50  0 - 5 WBC/hpf Final   Bacteria, UA 03/14/2022 RARE (A)  NONE SEEN Final   Squamous Epithelial / LPF 03/14/2022 0-5  0 - 5 Final   Performed at [facility], [address], [place], [place] [address]   Preg Test, Ur 03/14/2022 NEGATIVE  NEGATIVE Final   Comment:        THE SENSITIVITY OF THIS METHODOLOGY IS >20 mIU/mL. Performed at [facility], [address] [place], [place] [address]    Specimen Description 03/14/2022    Final                   Value:URINE, CLEAN CATCH Performed at [facility], [address] [place], [place] [address]    Special  Requests 03/14/2022    Final                   Value:NONE Performed at [facility], [address], [place], [place] [address]    Culture 03/14/2022 MULTIPLE SPECIES PRESENT, SUGGEST RECOLLECTION (A)   Final   Report Status 03/14/2022 03/16/2022 FINAL   Final  Admission on 03/07/2022, Discharged on 03/10/2022  Component Date Value Ref Range Status   Glucose-Capillary 03/08/2022 88  70 - 99 mg/dL Final   Glucose reference range applies only to samples taken after fasting for at least 8 hours.   Sodium 03/08/2022 140  135 - 145 mmol/L Final   Potassium 03/08/2022 3.8  3.5 -  5.1 mmol/L Final   Chloride 03/08/2022 107  98 - 111 mmol/L Final   CO2 03/08/2022 25  22 - 32 mmol/L Final   Glucose, Bld 03/08/2022 83  70 - 99 mg/dL Final   Glucose reference range applies only to samples taken after fasting for at least 8 hours.   BUN 03/08/2022 11  6 - 20 mg/dL Final   Creatinine, Ser 03/08/2022 0.53  0.44 - 1.00 mg/dL Final   Calcium 16/05/9603 8.3 (L)  8.9 - 10.3 mg/dL Final   Total Protein 54/04/8118 6.4 (L)  6.5 - 8.1 g/dL Final   Albumin 14/78/2956 3.5  3.5 - 5.0 g/dL Final   AST 21/30/8657 15  15 - 41 U/L Final   ALT 03/08/2022 18  0 - 44 U/L Final   Alkaline Phosphatase 03/08/2022 72  38 - 126 U/L Final   Total Bilirubin 03/08/2022 0.3  0.3 - 1.2 mg/dL Final   GFR, Estimated 03/08/2022 >60  >60 mL/min Final   Comment: (NOTE) Calculated using the CKD-EPI Creatinine Equation (2021)    Anion gap 03/08/2022 8  5 - 15 Final   Performed at [facility], [address] [place], [place] [address]   Salicylate Lvl 03/08/2022 <7.0 (L)  7.0 - 30.0 mg/dL Final   Performed at [facility], [address], [place], [place] [address]   Acetaminophen (Tylenol), Serum 03/08/2022 <10 (L)  10 - 30 ug/mL Final   Comment: (NOTE) Therapeutic concentrations vary significantly. A range of 10-30 ug/mL  may be an effective concentration for many  patients. However, some  are best treated at concentrations outside of this range. Acetaminophen concentrations >150 ug/mL at 4 hours after ingestion  and >50 ug/mL at 12 hours after ingestion are often associated with  toxic reactions.  Performed at [facility], [address], [place], [place] [address]    Alcohol, Ethyl (B) 03/08/2022 76 (H)  <10 mg/dL Final   Comment: (NOTE) Lowest detectable limit for serum alcohol is 10 mg/dL.  For medical purposes only. Performed at [facility], [address], [place], [place] [address]    Opiates 03/09/2022 NONE DETECTED  NONE DETECTED Final   Cocaine 03/09/2022 NONE DETECTED  NONE DETECTED Final   Benzodiazepines 03/09/2022 NONE DETECTED  NONE DETECTED Final   Amphetamines 03/09/2022 NONE DETECTED  NONE DETECTED Final   Tetrahydrocannabinol 03/09/2022 NONE DETECTED  NONE DETECTED Final   Barbiturates 03/09/2022 NONE DETECTED  NONE DETECTED Final   Comment: (NOTE) DRUG SCREEN FOR MEDICAL PURPOSES ONLY.  IF CONFIRMATION IS NEEDED FOR ANY PURPOSE, NOTIFY LAB WITHIN 5 DAYS.  LOWEST DETECTABLE LIMITS FOR URINE DRUG SCREEN Drug Class                     Cutoff (ng/mL) Amphetamine and metabolites    1000 Barbiturate and metabolites    200 Benzodiazepine                 200 Tricyclics and metabolites     300 Opiates and metabolites        300 Cocaine and metabolites        300 THC                            50 Performed at [facility], [address], [place], [place] [address]    WBC 03/08/2022 7.3  4.0 - 10.5 K/uL Final  RBC 03/08/2022 4.12  3.87 - 5.11 MIL/uL Final   Hemoglobin 03/08/2022 12.6  12.0 - 15.0 g/dL Final   HCT 96/11/5407 38.2  36.0 - 46.0 % Final   MCV 03/08/2022 92.7  80.0 - 100.0 fL Final   MCH 03/08/2022 30.6  26.0 - 34.0 pg Final   MCHC 03/08/2022 33.0  30.0 - 36.0 g/dL Final   RDW 81/19/1478 12.8  11.5 - 15.5 % Final   Platelets 03/08/2022 276  150 -  400 K/uL Final   nRBC 03/08/2022 0.0  0.0 - 0.2 % Final   Neutrophils Relative % 03/08/2022 32  % Final   Neutro Abs 03/08/2022 2.3  1.7 - 7.7 K/uL Final   Lymphocytes Relative 03/08/2022 58  % Final   Lymphs Abs 03/08/2022 4.2 (H)  0.7 - 4.0 K/uL Final   Monocytes Relative 03/08/2022 6  % Final   Monocytes Absolute 03/08/2022 0.5  0.1 - 1.0 K/uL Final   Eosinophils Relative 03/08/2022 2  % Final   Eosinophils Absolute 03/08/2022 0.2  0.0 - 0.5 K/uL Final   Basophils Relative 03/08/2022 1  % Final   Basophils Absolute 03/08/2022 0.1  0.0 - 0.1 K/uL Final   Immature Granulocytes 03/08/2022 1  % Final   Abs Immature Granulocytes 03/08/2022 0.04  0.00 - 0.07 K/uL Final   Performed at [facility], [address], [place], [place] [address]   I-stat hCG, quantitative 03/08/2022 <5.0  <5 mIU/mL Final   Performed at [facility], [address], [place], [place] [address]   SARS Coronavirus 2 by RT PCR 03/08/2022 NEGATIVE  NEGATIVE Final   Comment: (NOTE) SARS-CoV-2 target nucleic acids are NOT DETECTED.  The SARS-CoV-2 RNA is generally detectable in upper respiratory specimens during the acute phase of infection. The lowest concentration of SARS-CoV-2 viral copies this assay can detect is 138 copies/mL. A negative result does not preclude SARS-Cov-2 infection and should not be used as the sole basis for treatment or other patient management decisions. A negative result may occur with  improper specimen collection/handling, submission of specimen other than nasopharyngeal swab, presence of viral mutation(s) within the areas targeted by this assay, and inadequate number of viral copies(<138 copies/mL). A negative result must be combined with clinical observations, patient history, and epidemiological information. The expected result is Negative.  Fact Sheet for Patients:  BloggerCourse.com  Fact Sheet for Healthcare Providers:   SeriousBroker.it  This test is no                          t yet approved or cleared by the [place] FDA and  has been authorized for detection and/or diagnosis of SARS-CoV-2 by FDA under an Emergency Use Authorization (EUA). This EUA will remain  in effect (meaning this test can be used) for the duration of the COVID-19 declaration under Section 564(b)(1) of the Act, 21 U.S.C.section 360bbb-3(b)(1), unless the authorization is terminated  or revoked sooner.       Influenza A by PCR 03/08/2022 NEGATIVE  NEGATIVE Final   Influenza B by PCR 03/08/2022 NEGATIVE  NEGATIVE Final   Comment: (NOTE) The Xpert Xpress SARS-CoV-2/FLU/RSV plus assay is intended as an aid in the diagnosis of influenza from Nasopharyngeal swab specimens and should not be used as a sole basis for treatment. Nasal washings and aspirates are unacceptable for Xpert Xpress SARS-CoV-2/FLU/RSV testing.  Fact Sheet for Patients: BloggerCourse.com  Fact Sheet for Healthcare Providers: SeriousBroker.it  This test is not yet approved or cleared by the [place] FDA and has been authorized for detection and/or diagnosis of SARS-CoV-2 by FDA under an Emergency Use Authorization (EUA). This EUA will remain in effect (meaning this test can be used) for the duration of the COVID-19 declaration under Section 564(b)(1) of the Act, 21 U.S.C. section 360bbb-3(b)(1), unless the authorization is terminated or revoked.  Performed at [facility], [address] [place], [place] [id number]   Admission on 03/06/2022, Discharged on 03/07/2022  Component Date Value Ref Range Status   Sodium 03/06/2022 140  135 - 145 mmol/L Final   Potassium 03/06/2022 3.7  3.5 - 5.1 mmol/L Final   Chloride 03/06/2022 106  98 - 111 mmol/L Final   CO2 03/06/2022 26  22 - 32 mmol/L Final   Glucose, Bld 03/06/2022 121 (H)  70 - 99 mg/dL Final    Glucose reference range applies only to samples taken after fasting for at least 8 hours.   BUN 03/06/2022 7  6 - 20 mg/dL Final   Creatinine, Ser 03/06/2022 0.49  0.44 - 1.00 mg/dL Final   Calcium 19/14/782908/10/2021 9.1  8.9 - 10.3 mg/dL Final   Total Protein 56/21/308608/10/2021 7.2  6.5 - 8.1 g/dL Final   Albumin 57/84/696208/10/2021 4.0  3.5 - 5.0 g/dL Final   AST 95/28/413208/10/2021 18  15 - 41 U/L Final   ALT 03/06/2022 22  0 - 44 U/L Final   Alkaline Phosphatase 03/06/2022 81  38 - 126 U/L Final   Total Bilirubin 03/06/2022 0.5  0.3 - 1.2 mg/dL Final   GFR, Estimated 03/06/2022 >60  >60 mL/min Final   Comment: (NOTE) Calculated using the CKD-EPI Creatinine Equation (2021)    Anion gap 03/06/2022 8  5 - 15 Final   Performed at [facility], [address] [place], [place] [id number]   WBC 03/06/2022 8.5  4.0 - 10.5 K/uL Final   RBC 03/06/2022 4.82  3.87 - 5.11 MIL/uL Final   Hemoglobin 03/06/2022 14.4  12.0 - 15.0 g/dL Final   HCT 27/25/366408/10/2021 44.5  36.0 - 46.0 % Final   MCV 03/06/2022 92.3  80.0 - 100.0 fL Final   MCH 03/06/2022 29.9  26.0 - 34.0 pg Final   MCHC 03/06/2022 32.4  30.0 - 36.0 g/dL Final   RDW 40/34/742508/10/2021 12.6  11.5 - 15.5 % Final   Platelets 03/06/2022 314  150 - 400 K/uL Final   nRBC 03/06/2022 0.0  0.0 - 0.2 % Final   Neutrophils Relative % 03/06/2022 47  % Final   Neutro Abs 03/06/2022 4.1  1.7 - 7.7 K/uL Final   Lymphocytes Relative 03/06/2022 43  % Final   Lymphs Abs 03/06/2022 3.6  0.7 - 4.0 K/uL Final   Monocytes Relative 03/06/2022 7  % Final   Monocytes Absolute 03/06/2022 0.6  0.1 - 1.0 K/uL Final   Eosinophils Relative 03/06/2022 2  % Final   Eosinophils Absolute 03/06/2022 0.2  0.0 - 0.5 K/uL Final   Basophils Relative 03/06/2022 0  % Final   Basophils Absolute 03/06/2022 0.0  0.0 - 0.1 K/uL Final   Immature Granulocytes 03/06/2022 1  % Final   Abs Immature Granulocytes 03/06/2022 0.04  0.00 - 0.07 K/uL Final   Performed at [facility], [address] [place], [place] [id number]   Troponin I (High Sensitivity) 03/06/2022 <2  <18 ng/L Final   Comment: (NOTE) Elevated high sensitivity troponin I (hsTnI) values and  significant  changes across serial measurements may suggest ACS but many other  chronic and acute conditions are known to elevate hsTnI results.  Refer to the "Links" section for chest pain algorithms and additional  guidance. Performed at [facility], [place] [address] [place], [place] [address]    I-stat hCG, quantitative 03/06/2022 <5.0  <5 mIU/mL Final   Comment 3 03/06/2022          Final   Comment:   GEST. AGE      CONC.  (mIU/mL)   <=1 WEEK        5 - 50     2 WEEKS       50 - 500     3 WEEKS       100 - 10,000     4 WEEKS     1,000 - 30,000        FEMALE AND NON-PREGNANT FEMALE:     LESS THAN 5 mIU/mL    Color, Urine 03/06/2022 YELLOW  YELLOW Final   APPearance 03/06/2022 HAZY (A)  CLEAR Final   Specific Gravity, Urine 03/06/2022 1.015  1.005 - 1.030 Final   pH 03/06/2022 5.0  5.0 - 8.0 Final   Glucose, UA 03/06/2022 NEGATIVE  NEGATIVE mg/dL Final   Hgb urine dipstick 03/06/2022 NEGATIVE  NEGATIVE Final   Bilirubin Urine 03/06/2022 NEGATIVE  NEGATIVE Final   Ketones, ur 03/06/2022 NEGATIVE  NEGATIVE mg/dL Final   Protein, ur 03/06/2022 NEGATIVE  NEGATIVE mg/dL Final   Nitrite 03/06/2022 NEGATIVE  NEGATIVE Final   Leukocytes,Ua 03/06/2022 LARGE (A)  NEGATIVE Final   RBC / HPF 03/06/2022 0-5  0 - 5 RBC/hpf Final   WBC, UA 03/06/2022 11-20  0 - 5 WBC/hpf Final   Bacteria, UA 03/06/2022 NONE SEEN  NONE SEEN Final   Squamous Epithelial / LPF 03/06/2022 6-10  0 - 5 Final   Mucus 03/06/2022 PRESENT   Final   Performed at [facility], [place] [address], [place], [place] [address]   Lipase 03/06/2022 30  11 - 51 U/L Final   Performed at [facility], [place] [address] [place], [place] [address]   Opiates 03/06/2022 NONE DETECTED  NONE DETECTED Final    Cocaine 03/06/2022 NONE DETECTED  NONE DETECTED Final   Benzodiazepines 03/06/2022 NONE DETECTED  NONE DETECTED Final   Amphetamines 03/06/2022 NONE DETECTED  NONE DETECTED Final   Tetrahydrocannabinol 03/06/2022 NONE DETECTED  NONE DETECTED Final   Barbiturates 03/06/2022 NONE DETECTED  NONE DETECTED Final   Comment: (NOTE) DRUG SCREEN FOR MEDICAL PURPOSES ONLY.  IF CONFIRMATION IS NEEDED FOR ANY PURPOSE, NOTIFY LAB WITHIN 5 DAYS.  LOWEST DETECTABLE LIMITS FOR URINE DRUG SCREEN Drug Class                     Cutoff (ng/mL) Amphetamine and metabolites    1000 Barbiturate and metabolites    200 Benzodiazepine                 696 Tricyclics and metabolites     300 Opiates and metabolites        300 Cocaine and metabolites        300 THC                            50 Performed at [facility], [place] [address] [place],  [address]   Admission on 01/17/2022, Discharged on  01/18/2022  Component Date Value Ref Range Status   Lipase 01/18/2022 36  11 - 51 U/L Final   Performed at [facility], [address], [place], [place] [address]   Sodium 01/18/2022 140  135 - 145 mmol/L Final   Potassium 01/18/2022 3.4 (L)  3.5 - 5.1 mmol/L Final   Chloride 01/18/2022 110  98 - 111 mmol/L Final   CO2 01/18/2022 25  22 - 32 mmol/L Final   Glucose, Bld 01/18/2022 92  70 - 99 mg/dL Final   Glucose reference range applies only to samples taken after fasting for at least 8 hours.   BUN 01/18/2022 9  6 - 20 mg/dL Final   Creatinine, Ser 01/18/2022 0.50  0.44 - 1.00 mg/dL Final   Calcium 60/45/4098 8.9  8.9 - 10.3 mg/dL Final   Total Protein 11/91/4782 7.3  6.5 - 8.1 g/dL Final   Albumin 95/62/1308 3.9  3.5 - 5.0 g/dL Final   AST 65/78/4696 18  15 - 41 U/L Final   ALT 01/18/2022 17  0 - 44 U/L Final   Alkaline Phosphatase 01/18/2022 101  38 - 126 U/L Final   Total Bilirubin 01/18/2022 0.9  0.3 - 1.2 mg/dL Final   GFR, Estimated 01/18/2022 >60  >60 mL/min  Final   Comment: (NOTE) Calculated using the CKD-EPI Creatinine Equation (2021)    Anion gap 01/18/2022 5  5 - 15 Final   Performed at [facility], [address] [place], [place] [address]   WBC 01/18/2022 7.6  4.0 - 10.5 K/uL Final   RBC 01/18/2022 4.67  3.87 - 5.11 MIL/uL Final   Hemoglobin 01/18/2022 14.3  12.0 - 15.0 g/dL Final   HCT 41/32/4401 43.3  36.0 - 46.0 % Final   MCV 01/18/2022 92.7  80.0 - 100.0 fL Final   MCH 01/18/2022 30.6  26.0 - 34.0 pg Final   MCHC 01/18/2022 33.0  30.0 - 36.0 g/dL Final   RDW 02/72/5366 14.3  11.5 - 15.5 % Final   Platelets 01/18/2022 282  150 - 400 K/uL Final   nRBC 01/18/2022 0.0  0.0 - 0.2 % Final   Performed at [facility], [address] [place], [place] [address]   Color, Urine 01/18/2022 YELLOW  YELLOW Final   APPearance 01/18/2022 HAZY (A)  CLEAR Final   Specific Gravity, Urine 01/18/2022 1.020  1.005 - 1.030 Final   pH 01/18/2022 5.0  5.0 - 8.0 Final   Glucose, UA 01/18/2022 NEGATIVE  NEGATIVE mg/dL Final   Hgb urine dipstick 01/18/2022 NEGATIVE  NEGATIVE Final   Bilirubin Urine 01/18/2022 NEGATIVE  NEGATIVE Final   Ketones, ur 01/18/2022 NEGATIVE  NEGATIVE mg/dL Final   Protein, ur 74/25/9563 NEGATIVE  NEGATIVE mg/dL Final   Nitrite 87/56/4332 NEGATIVE  NEGATIVE Final   Leukocytes,Ua 01/18/2022 LARGE (A)  NEGATIVE Final   RBC / HPF 01/18/2022 6-10  0 - 5 RBC/hpf Final   WBC, UA 01/18/2022 >50 (H)  0 - 5 WBC/hpf Final   Bacteria, UA 01/18/2022 RARE (A)  NONE SEEN Final   Squamous Epithelial / LPF 01/18/2022 11-20  0 - 5 Final   Mucus 01/18/2022 PRESENT   Final   Non Squamous Epithelial 01/18/2022 0-5 (A)  NONE SEEN Final   Performed at [facility], [address] [place], [place] [address]   Specimen Description 01/18/2022    Final  Value:BLOOD LEFT ANTECUBITAL Performed at [facility], [address], [place], [place] [address]     Special Requests 01/18/2022    Final                   Value:BOTTLES DRAWN AEROBIC AND ANAEROBIC Blood Culture adequate volume Performed at [facility], [address], [place], [place] [address]    Culture 01/18/2022    Final                   Value:NO GROWTH 5 DAYS Performed at [facility], [address], [place], [place] [address]    Report Status 01/18/2022 01/23/2022 FINAL   Final   Specimen Description 01/18/2022    Final                   Value:BLOOD BLOOD LEFT HAND Performed at [facility], [address] [place], [place] [address]    Special Requests 01/18/2022    Final                   Value:BOTTLES DRAWN AEROBIC AND ANAEROBIC Blood Culture adequate volume Performed at [facility], [address] [place], [place] [address]    Culture 01/18/2022    Final                   Value:NO GROWTH 5 DAYS Performed at [facility], [address] [place], [place] [address]    Report Status 01/18/2022 01/23/2022 FINAL   Final   Lactic Acid, Venous 01/18/2022 1.4  0.5 - 1.9 mmol/L Final   Performed at [facility], [address] [place], [place] [address]   Specimen Description 01/18/2022    Final                   Value:URINE, CLEAN CATCH Performed at [facility], [address], [place], [place] [address]    Special Requests 01/18/2022    Final                   Value:NONE Performed at [facility], [address], [place], [place] [address]    Culture 01/18/2022  (A)   Final                   Value:<10,000 COLONIES/mL INSIGNIFICANT GROWTH Performed at [facility], [address] [place], [place] [address]    Report Status 01/18/2022 01/19/2022 FINAL   Final   Opiates 01/18/2022 NONE DETECTED  NONE DETECTED Final   Cocaine 01/18/2022 POSITIVE (A)  NONE DETECTED Final   Benzodiazepines 01/18/2022 NONE DETECTED  NONE DETECTED Final    Amphetamines 01/18/2022 NONE DETECTED  NONE DETECTED Final   Tetrahydrocannabinol 01/18/2022 NONE DETECTED  NONE DETECTED Final   Barbiturates 01/18/2022 NONE DETECTED  NONE DETECTED Final   Comment: (NOTE) DRUG SCREEN FOR MEDICAL PURPOSES ONLY.  IF CONFIRMATION IS NEEDED FOR ANY PURPOSE, NOTIFY LAB WITHIN 5 DAYS.  LOWEST DETECTABLE LIMITS FOR URINE DRUG SCREEN Drug Class                     Cutoff (ng/mL) Amphetamine and metabolites    1000 Barbiturate and metabolites    200 Benzodiazepine                 200 Tricyclics and metabolites     300 Opiates and metabolites  300 Cocaine and metabolites        300 THC                            50 Performed at [facility], [address], [place], [place] [address]    Alcohol, Ethyl (B) 01/18/2022 <10  <10 mg/dL Final   Comment: (NOTE) Lowest detectable limit for serum alcohol is 10 mg/dL.  For medical purposes only. Performed at [facility], [address] [place], [place] [address]    HIV-1 P24 Antigen - HIV24 01/18/2022 NON REACTIVE  NON REACTIVE Final   Comment: RESULT CALLED TO, READ BACK BY AND VERIFIED WITH: GRIFFIN,M RN ON 01/18/22 @ 1359 BY GOLSONM RESULT CALLED TO, READ BACK BY AND VERIFIED WITH: GRIFFIN,M RN ON 01/18/22 @ 1359 BY GOLSONM (NOTE) Detection of p24 may be inhibited by biotin in the sample, causing false negative results in acute infection.    HIV 1/2 Antibodies 01/18/2022 NON REACTIVE  NON REACTIVE Final   Interpretation (HIV Ag Ab) 01/18/2022 A non reactive test result means that HIV 1 or HIV 2 antibodies and HIV 1 p24 antigen were not detected in the specimen.   Final   Performed at [facility], [address], [place], [place] [address]  Admission on 01/13/2022, Discharged on 01/14/2022  Component Date Value Ref Range Status   Sodium 01/13/2022 138  135 - 145 mmol/L Final   Potassium 01/13/2022 3.2 (L)  3.5 - 5.1 mmol/L Final   DELTA  CHECK NOTED   Chloride 01/13/2022 103  98 - 111 mmol/L Final   CO2 01/13/2022 26  22 - 32 mmol/L Final   Glucose, Bld 01/13/2022 90  70 - 99 mg/dL Final   Glucose reference range applies only to samples taken after fasting for at least 8 hours.   BUN 01/13/2022 9  6 - 20 mg/dL Final   Creatinine, Ser 01/13/2022 0.68  0.44 - 1.00 mg/dL Final   Calcium 91/47/8295 8.6 (L)  8.9 - 10.3 mg/dL Final   Total Protein 62/13/0865 6.5  6.5 - 8.1 g/dL Final   Albumin 78/46/9629 3.7  3.5 - 5.0 g/dL Final   AST 52/84/1324 22  15 - 41 U/L Final   ALT 01/13/2022 21  0 - 44 U/L Final   Alkaline Phosphatase 01/13/2022 97  38 - 126 U/L Final   Total Bilirubin 01/13/2022 0.7  0.3 - 1.2 mg/dL Final   GFR, Estimated 01/13/2022 >60  >60 mL/min Final   Comment: (NOTE) Calculated using the CKD-EPI Creatinine Equation (2021)    Anion gap 01/13/2022 9  5 - 15 Final   Performed at [facility], [address], [place], [place] [address]   WBC 01/13/2022 6.8  4.0 - 10.5 K/uL Final   RBC 01/13/2022 4.28  3.87 - 5.11 MIL/uL Final   Hemoglobin 01/13/2022 12.7  12.0 - 15.0 g/dL Final   HCT 72/53/6644 38.2  36.0 - 46.0 % Final   MCV 01/13/2022 89.3  80.0 - 100.0 fL Final   MCH 01/13/2022 29.7  26.0 - 34.0 pg Final   MCHC 01/13/2022 33.2  30.0 - 36.0 g/dL Final   RDW 03/47/4259 13.4  11.5 - 15.5 % Final   Platelets 01/13/2022 294  150 - 400 K/uL Final   nRBC 01/13/2022 0.0  0.0 - 0.2 % Final   Neutrophils Relative % 01/13/2022 42  % Final   Neutro Abs  01/13/2022 2.8  1.7 - 7.7 K/uL Final   Lymphocytes Relative 01/13/2022 43  % Final   Lymphs Abs 01/13/2022 3.0  0.7 - 4.0 K/uL Final   Monocytes Relative 01/13/2022 10  % Final   Monocytes Absolute 01/13/2022 0.7  0.1 - 1.0 K/uL Final   Eosinophils Relative 01/13/2022 3  % Final   Eosinophils Absolute 01/13/2022 0.2  0.0 - 0.5 K/uL Final   Basophils Relative 01/13/2022 1  % Final   Basophils Absolute 01/13/2022 0.0  0.0 - 0.1 K/uL Final   Immature  Granulocytes 01/13/2022 1  % Final   Abs Immature Granulocytes 01/13/2022 0.05  0.00 - 0.07 K/uL Final   Performed at [facility], [address], [place], [place] [address]  Admission on 01/13/2022, Discharged on 01/13/2022  Component Date Value Ref Range Status   Sodium 01/13/2022 141  135 - 145 mmol/L Final   Potassium 01/13/2022 3.9  3.5 - 5.1 mmol/L Final   DELTA CHECK NOTED   Chloride 01/13/2022 104  98 - 111 mmol/L Final   CO2 01/13/2022 30  22 - 32 mmol/L Final   Glucose, Bld 01/13/2022 120 (H)  70 - 99 mg/dL Final   Glucose reference range applies only to samples taken after fasting for at least 8 hours.   BUN 01/13/2022 5 (L)  6 - 20 mg/dL Final   Creatinine, Ser 01/13/2022 0.60  0.44 - 1.00 mg/dL Final   Calcium 60/45/4098 8.6 (L)  8.9 - 10.3 mg/dL Final   GFR, Estimated 01/13/2022 >60  >60 mL/min Final   Comment: (NOTE) Calculated using the CKD-EPI Creatinine Equation (2021)    Anion gap 01/13/2022 7  5 - 15 Final   Performed at [facility], [address] [place], [place] [address]   WBC 01/13/2022 8.2  4.0 - 10.5 K/uL Final   RBC 01/13/2022 4.70  3.87 - 5.11 MIL/uL Final   Hemoglobin 01/13/2022 14.6  12.0 - 15.0 g/dL Final   HCT 78/29/5621 42.5  36.0 - 46.0 % Final   MCV 01/13/2022 90.4  80.0 - 100.0 fL Final   MCH 01/13/2022 31.1  26.0 - 34.0 pg Final   MCHC 01/13/2022 34.4  30.0 - 36.0 g/dL Final   RDW 30/86/5784 13.3  11.5 - 15.5 % Final   Platelets 01/13/2022 304  150 - 400 K/uL Final   nRBC 01/13/2022 0.0  0.0 - 0.2 % Final   Performed at [facility], [address] [place], [place] [address]   Troponin I (High Sensitivity) 01/13/2022 3  <18 ng/L Final   Comment: (NOTE) Elevated high sensitivity troponin I (hsTnI) values and significant  changes across serial measurements may suggest ACS but many other  chronic and acute conditions are known to elevate hsTnI results.  Refer to the Links section for chest pain algorithms and additional   guidance. Performed at [facility], [address], [place], [place] [address]    I-stat hCG, quantitative 01/13/2022 <5.0  <5 mIU/mL Final   Comment 3 01/13/2022          Final   Comment:   GEST. AGE      CONC.  (mIU/mL)   <=1 WEEK        5 - 50     2 WEEKS       50 - 500     3 WEEKS       100 - 10,000     4 WEEKS  1,000 - 30,000        FEMALE AND NON-PREGNANT FEMALE:     LESS THAN 5 mIU/mL    Troponin I (High Sensitivity) 01/13/2022 3  <18 ng/L Final   Comment: (NOTE) Elevated high sensitivity troponin I (hsTnI) values and significant  changes across serial measurements may suggest ACS but many other  chronic and acute conditions are known to elevate hsTnI results.  Refer to the "Links" section for chest pain algorithms and additional  guidance. Performed at [facility], [address] [place], [place] [address]   Admission on 01/11/2022, Discharged on 01/12/2022  Component Date Value Ref Range Status   Sodium 01/11/2022 138  135 - 145 mmol/L Final   Potassium 01/11/2022 3.2 (L)  3.5 - 5.1 mmol/L Final   Chloride 01/11/2022 101  98 - 111 mmol/L Final   CO2 01/11/2022 24  22 - 32 mmol/L Final   Glucose, Bld 01/11/2022 120 (H)  70 - 99 mg/dL Final   Glucose reference range applies only to samples taken after fasting for at least 8 hours.   BUN 01/11/2022 11  6 - 20 mg/dL Final   Creatinine, Ser 01/11/2022 0.67  0.44 - 1.00 mg/dL Final   Calcium 47/82/9562 8.9  8.9 - 10.3 mg/dL Final   GFR, Estimated 01/11/2022 >60  >60 mL/min Final   Comment: (NOTE) Calculated using the CKD-EPI Creatinine Equation (2021)    Anion gap 01/11/2022 13  5 - 15 Final   Performed at [facility], [address], [place], [place] [address]   WBC 01/11/2022 10.8 (H)  4.0 - 10.5 K/uL Final   RBC 01/11/2022 4.56  3.87 - 5.11 MIL/uL Final   Hemoglobin 01/11/2022 13.8  12.0 - 15.0 g/dL Final   HCT 57/84/6962 40.2  36.0 - 46.0 % Final   MCV 01/11/2022 88.2  80.0 - 100.0 fL  Final   MCH 01/11/2022 30.3  26.0 - 34.0 pg Final   MCHC 01/11/2022 34.3  30.0 - 36.0 g/dL Final   RDW 95/28/4132 13.3  11.5 - 15.5 % Final   Platelets 01/11/2022 311  150 - 400 K/uL Final   nRBC 01/11/2022 0.0  0.0 - 0.2 % Final   Performed at [facility], [address], [place], [place] [address]   Troponin I (High Sensitivity) 01/11/2022 4  <18 ng/L Final   Comment: (NOTE) Elevated high sensitivity troponin I (hsTnI) values and significant  changes across serial measurements may suggest ACS but many other  chronic and acute conditions are known to elevate hsTnI results.  Refer to the "Links" section for chest pain algorithms and additional  guidance. Performed at [facility], [address], [place], [place] [address]   Admission on 01/06/2022, Discharged on 01/07/2022  Component Date Value Ref Range Status   Color, Urine 01/07/2022 STRAW (A)  YELLOW Final   APPearance 01/07/2022 CLEAR  CLEAR Final   Specific Gravity, Urine 01/07/2022 1.045 (H)  1.005 - 1.030 Final   pH 01/07/2022 6.0  5.0 - 8.0 Final   Glucose, UA 01/07/2022 NEGATIVE  NEGATIVE mg/dL Final   Hgb urine dipstick 01/07/2022 NEGATIVE  NEGATIVE Final   Bilirubin Urine 01/07/2022 NEGATIVE  NEGATIVE Final   Ketones, ur 01/07/2022 NEGATIVE  NEGATIVE mg/dL Final   Protein, ur 66/44/0347 NEGATIVE  NEGATIVE mg/dL Final   Nitrite 42/59/5638 NEGATIVE  NEGATIVE Final   Leukocytes,Ua 01/07/2022 LARGE (A)  NEGATIVE Final   RBC / HPF 01/07/2022 0-5  0 - 5 RBC/hpf Final   WBC, UA 01/07/2022 6-10  0 - 5 WBC/hpf Final   Bacteria, UA 01/07/2022 NONE SEEN  NONE SEEN Final   Squamous Epithelial / LPF 01/07/2022 0-5  0 - 5 Final   Mucus 01/07/2022 PRESENT   Final   Performed at [facility], [address], [place], [place] [address]   WBC 01/07/2022 7.6  4.0 - 10.5 K/uL Final   RBC 01/07/2022 4.27  3.87 - 5.11 MIL/uL Final   Hemoglobin 01/07/2022 13.0  12.0 - 15.0 g/dL Final   HCT 60/45/4098 38.8   36.0 - 46.0 % Final   MCV 01/07/2022 90.9  80.0 - 100.0 fL Final   MCH 01/07/2022 30.4  26.0 - 34.0 pg Final   MCHC 01/07/2022 33.5  30.0 - 36.0 g/dL Final   RDW 11/91/4782 13.3  11.5 - 15.5 % Final   Platelets 01/07/2022 230  150 - 400 K/uL Final   nRBC 01/07/2022 0.0  0.0 - 0.2 % Final   Performed at [facility], [address] [place], [place] [address]   Sodium 01/07/2022 142  135 - 145 mmol/L Final   Potassium 01/07/2022 3.9  3.5 - 5.1 mmol/L Final   Chloride 01/07/2022 107  98 - 111 mmol/L Final   CO2 01/07/2022 29  22 - 32 mmol/L Final   Glucose, Bld 01/07/2022 92  70 - 99 mg/dL Final   Glucose reference range applies only to samples taken after fasting for at least 8 hours.   BUN 01/07/2022 13  6 - 20 mg/dL Final   Creatinine, Ser 01/07/2022 0.49  0.44 - 1.00 mg/dL Final   Calcium 30/86/5784 8.9  8.9 - 10.3 mg/dL Final   Total Protein 69/62/9528 6.4 (L)  6.5 - 8.1 g/dL Final   Albumin 41/32/4401 3.6  3.5 - 5.0 g/dL Final   AST 02/72/5366 19  15 - 41 U/L Final   ALT 01/07/2022 19  0 - 44 U/L Final   Alkaline Phosphatase 01/07/2022 121  38 - 126 U/L Final   Total Bilirubin 01/07/2022 0.4  0.3 - 1.2 mg/dL Final   GFR, Estimated 01/07/2022 >60  >60 mL/min Final   Comment: (NOTE) Calculated using the CKD-EPI Creatinine Equation (2021)    Anion gap 01/07/2022 6  5 - 15 Final   Performed at [facility], [address], [place], [place] [address]   Troponin I (High Sensitivity) 01/07/2022 <2  <18 ng/L Final   Comment: (NOTE) Elevated high sensitivity troponin I (hsTnI) values and significant  changes across serial measurements may suggest ACS but many other  chronic and acute conditions are known to elevate hsTnI results.  Refer to the "Links" section for chest pain algorithms and additional  guidance. Performed at [facility], [address] [place], [place] [address]    Alcohol, Ethyl (B) 01/07/2022 <10  <10 mg/dL  Final   Comment: (NOTE) Lowest detectable limit for serum alcohol is 10 mg/dL.  For medical purposes only. Performed at [facility], [address] [place], [place] [address]   Admission on 01/05/2022, Discharged on 01/06/2022  Component Date Value Ref Range Status   Sodium 01/06/2022 142  135 - 145 mmol/L Final   Potassium 01/06/2022 3.7  3.5 - 5.1 mmol/L Final   Chloride 01/06/2022 109  98 - 111 mmol/L Final   CO2 01/06/2022 25  22 - 32 mmol/L Final   Glucose, Bld 01/06/2022 75  70 -  99 mg/dL Final   Glucose reference range applies only to samples taken after fasting for at least 8 hours.   BUN 01/06/2022 11  6 - 20 mg/dL Final   Creatinine, Ser 01/06/2022 0.46  0.44 - 1.00 mg/dL Final   Calcium 40/98/1191 8.9  8.9 - 10.3 mg/dL Final   Total Protein 47/82/9562 6.4 (L)  6.5 - 8.1 g/dL Final   Albumin 13/03/6577 3.6  3.5 - 5.0 g/dL Final   AST 46/96/2952 22  15 - 41 U/L Final   ALT 01/06/2022 23  0 - 44 U/L Final   Alkaline Phosphatase 01/06/2022 110  38 - 126 U/L Final   Total Bilirubin 01/06/2022 0.7  0.3 - 1.2 mg/dL Final   GFR, Estimated 01/06/2022 >60  >60 mL/min Final   Comment: (NOTE) Calculated using the CKD-EPI Creatinine Equation (2021)    Anion gap 01/06/2022 8  5 - 15 Final   Performed at [facility], [address], [place], [place] [address]   Lipase 01/06/2022 33  11 - 51 U/L Final   Performed at [facility], [address], [place], [place] [address]   WBC 01/06/2022 8.9  4.0 - 10.5 K/uL Final   RBC 01/06/2022 4.56  3.87 - 5.11 MIL/uL Final   Hemoglobin 01/06/2022 13.7  12.0 - 15.0 g/dL Final   HCT 27/25/3664 40.7  36.0 - 46.0 % Final   MCV 01/06/2022 89.3  80.0 - 100.0 fL Final   MCH 01/06/2022 30.0  26.0 - 34.0 pg Final   MCHC 01/06/2022 33.7  30.0 - 36.0 g/dL Final   RDW 40/34/7425 13.0  11.5 - 15.5 % Final   Platelets 01/06/2022 256  150 - 400 K/uL Final   nRBC 01/06/2022 0.0  0.0 - 0.2 % Final    Neutrophils Relative % 01/06/2022 35  % Final   Neutro Abs 01/06/2022 3.1  1.7 - 7.7 K/uL Final   Lymphocytes Relative 01/06/2022 51  % Final   Lymphs Abs 01/06/2022 4.6 (H)  0.7 - 4.0 K/uL Final   Monocytes Relative 01/06/2022 7  % Final   Monocytes Absolute 01/06/2022 0.6  0.1 - 1.0 K/uL Final   Eosinophils Relative 01/06/2022 5  % Final   Eosinophils Absolute 01/06/2022 0.4  0.0 - 0.5 K/uL Final   Basophils Relative 01/06/2022 1  % Final   Basophils Absolute 01/06/2022 0.1  0.0 - 0.1 K/uL Final   Immature Granulocytes 01/06/2022 1  % Final   Abs Immature Granulocytes 01/06/2022 0.04  0.00 - 0.07 K/uL Final   Performed at [facility], [address], [place], [place] [address]   D-Dimer, Quant 01/06/2022 0.34  0.00 - 0.50 ug/mL-FEU Final   Comment: (NOTE) At the manufacturer cut-off value of 0.5 g/mL FEU, this assay has a negative predictive value of 95-100%.This assay is intended for use in conjunction with a clinical pretest probability (PTP) assessment model to exclude pulmonary embolism (PE) and deep venous thrombosis (DVT) in outpatients suspected of PE or DVT. Results should be correlated with clinical presentation. Performed at [facility], [address], [place], [place] [address]    RPR Ser Ql 01/06/2022 NON REACTIVE  NON REACTIVE Final   Performed at [facility], [address], [place], [place] [address]   HIV Screen 4th Generation wRfx 01/06/2022 Non Reactive  Non Reactive Final   Performed at [facility], [address], [place], [place] [address]  There may be  more visits with results that are not included.    Allergies: Patient has no known allergies.  PTA Medications: (Not in a hospital admission)   Medical Decision Making  Inpatient observation   Meds ordered this encounter  Medications   acetaminophen (TYLENOL) tablet 650 mg   alum & mag hydroxide-simeth (MAALOX/MYLANTA) 200-200-20 MG/5ML  suspension 30 mL   magnesium hydroxide (MILK OF MAGNESIA) suspension 30 mL   melatonin tablet 5 mg   thiamine (VITAMIN B1) injection 100 mg   thiamine (VITAMIN B1) tablet 100 mg   multivitamin with minerals tablet 1 tablet   LORazepam (ATIVAN) tablet 1 mg   hydrOXYzine (ATARAX) tablet 25 mg   loperamide (IMODIUM) capsule 2-4 mg   ondansetron (ZOFRAN-ODT) disintegrating tablet 4 mg    Lab Orders         Resp Panel by RT-PCR (Flu A&B, Covid) Anterior Nasal Swab         CBC with Differential/Platelet         Comprehensive metabolic panel         Hemoglobin A1c         Lipid panel         TSH         Pregnancy, urine         POCT Urine Drug Screen - (I-Screen)      Recommendations  Based on my evaluation the patient does not appear to have an emergency medical condition.  Sindy Guadeloupe, NP 05/08/22  12:57 AM

## 2022-05-08 NOTE — ED Notes (Signed)
Patient scored 21 on the CIWA. Patient received her medications and ate dinner. Patient is being monitored for safety.

## 2022-05-08 NOTE — ED Notes (Signed)
Pt A&Ox4, anxious, presents with SI, plan to use fatal drugs. Pt using alcohol also, states whatever she can get  her hands on daily. Denies I, HI or AVH.  Comfort measures given.  MOnitoring for safety.

## 2022-05-08 NOTE — ED Notes (Signed)
Pt laying on bed eating lunch. Received medication without difficulty. No acute distress noted. Safety maintained.

## 2022-05-08 NOTE — ED Notes (Signed)
Patient was admitted to [facility] from [facility]. Patient reported she did not want to hurt herself but she was hearing voices. Patient denies HI. Patient is being monitored for safety.

## 2022-05-09 DIAGNOSIS — F1994 Other psychoactive substance use, unspecified with psychoactive substance-induced mood disorder: Secondary | ICD-10-CM | POA: Diagnosis not present

## 2022-05-09 DIAGNOSIS — F10129 Alcohol abuse with intoxication, unspecified: Secondary | ICD-10-CM | POA: Diagnosis not present

## 2022-05-09 DIAGNOSIS — Z1152 Encounter for screening for COVID-19: Secondary | ICD-10-CM | POA: Diagnosis not present

## 2022-05-09 DIAGNOSIS — R45851 Suicidal ideations: Secondary | ICD-10-CM | POA: Diagnosis not present

## 2022-05-09 NOTE — Discharge Instructions (Addendum)
SUBSTANCE USE TREATMENT for Medicaid and State Funded/IPRS  [facility]) [place], [place], [address] [phone] phone NOTE: [facility] is no longer offering IOP services.  Serves those who are low-income or have no insurance.  [facility] [address], [place], [place], [address] [phone] phone [phone] fax NOTE: Does have Substance Abuse-Intensive Outpatient Program [facility]) as well as transitional housing if eligible.  [facility] [place], [place], [address] [phone] phone [phone] fax  [facility] [phone] [place], [place], [address] [phone] phone [phone] fax   [facility] (women only at this campus) [place], [place] [address] [phone]  ((These programs listed above have a one-time application fee.))  [place] [place], [place] [address] [phone] [address], [place] [address] [phone]   MEDICAL DETOX/RESIDENTIAL TREATMENT- MEDICAID/IPRS:  [facility] [id number] [place], [place] [address] [phone]  [facility] [address] [place], [place] [address] [phone]  [facility] [address] [place], [place] [address] [phone]  [facility] [address] [place], [place] [address] [phone]  [place], [place] [address] [phone]  ((For admissions to these three [name] facilities during weekday days and possibly other times, contact [place], phone: [phone]; fax: [phone]))  [facility] (detox for men and women) [place], [place] [address] [phone]  MEDICAL DETOX AND RESIDENTIAL TREATMENT - INSURANCE:  [facility] (also offers CD-IOP for graduates of  residential program) [address] [place], [place] [address] [phone]  [facility] (now accepting [place]) [place] [address] [phone]  [facility] (accept [facility] for some services)  [address] [place], [place] [address] [phone]  OUTPATIENT PROGRAMS:  [facility]) [place], [place] [address] [phone]  ((CD-IOP is currently not operational; Opioid replacement clinic is operational))   [facility] (private insurance) [place] [place], [place] [address] [phone]  ((CD-IOP (afternoon program); individual therapy))   [facility] (Medicaid & IPRS for [facility] residents) [place], [place] [address] [phone]  ((CD-IOP; new clients must go through walk-in clinic; NOT CURRENTLY OPERATIONAL))   [facility] [employer] [facility] (Medicaid & IPRS for [facility] residents) [address] [place] [place], [place] [address] [phone]  ((CD-IOP))   [facility] [place] (Medicaid for [employer] and [employer]; some private insurance) [address] [place], [place] [address] [phone]  ((CD-IOP))   [facility] ([place] insurance) [phone] [address] [place], [place] [address] [phone]  ((CD-IOP (morning & evening programs); individual therapy))   HALFWAY HOUSES:  [facility] [phone]  [email or web address]   OPIOID REPLACEMENT PROVIDERS:  [facility]) [place], [place] [address] [phone]  [place] [address] [place], [place] [address] [phone]  [facility] [address] [place], [place] [address] [phone]   12 STEP PROGRAMS:  Alcoholics Anonymous of [place]  ReportZoo.[email or web address]  Narcotics Anonymous of  [employer].dk  Al-Anon of [place], [place] www.g[place]alanon.org/find-meetings.html  Nar-Anon https://nar-anon.org/find-a-meetings   Patient is instructed prior to discharge to:  Take  all medications as prescribed by his/her mental healthcare provider. Report any adverse effects and or reactions from the medicines to his/her outpatient provider promptly. Keep all scheduled appointments, to ensure that you are getting refills on time and to avoid any interruption in your medication.  If you are unable to keep an appointment call to reschedule.  Be sure to follow-up with resources and follow-up appointments provided.  Patient has been instructed & cautioned: To not engage in alcohol and or illegal drug use while on prescription medicines. In the event of worsening symptoms, patient is instructed to call the crisis hotline, 911 and or go to the nearest ED for appropriate evaluation and treatment of symptoms. To follow-up with his/her primary care provider for your other medical issues, concerns and or health care needs.

## 2022-05-09 NOTE — ED Triage Notes (Signed)
[place] to be D/C'd Home per MD order. Discussed with the patient and all questions fully answered. An After Visit Summary was printed and given to the patient. Patient escorted out and D/C home via private auto.  Clois Dupes  05/09/2022 1:50 PM

## 2022-05-09 NOTE — Progress Notes (Signed)
LCSW Progress Note   LCSW was informed at approximately 0945 that the pt is requesting to be discharged.  No treatment team meeting has been conducted yet.  LCSW informed Dr. Alvie Heidelberg to inquire about doing the treatment team meeting with her prior to discharging.    Omelia Blackwater, MSW, [place] [facility] [phone] phone [phone] work mobile

## 2022-05-09 NOTE — BH IP Treatment Plan (Signed)
Interdisciplinary Treatment and Diagnostic Plan Update  05/09/2022 Time of Session: [place] MRN: [id number]  Diagnosis:  Final diagnoses:  Alcohol abuse with intoxication ([place])  Polysubstance abuse ([place])  Insomnia, unspecified type  Homelessness  Mood disorder ([place])  Chronic anticoagulation     Current Medications:  Current Facility-Administered Medications  Medication Dose Route Frequency Provider Last Rate Last Admin   acetaminophen (TYLENOL) tablet 650 mg  650 mg Oral Q6H PRN Evette Georges, NP   650 mg at 05/08/22 1758   alum & mag hydroxide-simeth (MAALOX/MYLANTA) 200-200-20 MG/5ML suspension 30 mL  30 mL Oral Q4H PRN Evette Georges, NP       escitalopram (LEXAPRO) tablet 5 mg  5 mg Oral QHS Scot Jun, FNP   5 mg at 05/08/22 2156   hydrOXYzine (ATARAX) tablet 25 mg  25 mg Oral Q6H PRN Evette Georges, NP       loperamide (IMODIUM) capsule 2-4 mg  2-4 mg Oral PRN Evette Georges, NP       LORazepam (ATIVAN) tablet 1 mg  1 mg Oral Q6H PRN Evette Georges, NP   1 mg at 05/08/22 1758   magnesium hydroxide (MILK OF MAGNESIA) suspension 30 mL  30 mL Oral Daily PRN Evette Georges, NP       melatonin tablet 5 mg  5 mg Oral QHS Evette Georges, NP   5 mg at 05/08/22 2156   multivitamin with minerals tablet 1 tablet  1 tablet Oral Daily Evette Georges, NP   1 tablet at 05/08/22 1000   ondansetron (ZOFRAN-ODT) disintegrating tablet 4 mg  4 mg Oral Q6H PRN Evette Georges, NP   4 mg at 05/08/22 1800   rivaroxaban (XARELTO) tablet 20 mg  20 mg Oral Q supper Scot Jun, FNP   20 mg at 05/08/22 1616   thiamine (VITAMIN B1) injection 100 mg  100 mg Intramuscular Once Evette Georges, NP       thiamine (VITAMIN B1) tablet 100 mg  100 mg Oral Daily Evette Georges, NP       Current Outpatient Medications  Medication Sig Dispense Refill   ARIPiprazole (ABILIFY) 15 MG tablet Take 1 tablet (15 mg total) by mouth daily. 30 tablet 0   escitalopram (LEXAPRO) 5 MG tablet Take 3 tablets  (15 mg total) by mouth daily. 90 tablet 0   gabapentin (NEURONTIN) 100 MG capsule Take 2 capsules (200 mg total) by mouth 3 (three) times daily. 180 capsule 0   ibuprofen (ADVIL) 600 MG tablet Take 1 tablet (600 mg total) by mouth every 6 (six) hours as needed. 30 tablet 0   Multiple Vitamin (MULTIVITAMIN WITH MINERALS) TABS tablet Take 1 tablet by mouth daily.     rivaroxaban (XARELTO) 20 MG TABS tablet Take 1 tablet (20 mg total) by mouth daily with supper. 30 tablet 0   traZODone (DESYREL) 50 MG tablet Take 1 tablet (50 mg total) by mouth at bedtime as needed for sleep. 30 tablet 0   PTA Medications: Prior to Admission medications   Medication Sig Start Date End Date Taking? Authorizing Provider  ARIPiprazole (ABILIFY) 15 MG tablet Take 1 tablet (15 mg total) by mouth daily. 03/20/22 05/08/22 Yes Massengill, Ovid Curd, MD  escitalopram (LEXAPRO) 5 MG tablet Take 3 tablets (15 mg total) by mouth daily. 03/20/22 05/08/22 Yes Massengill, Ovid Curd, MD  gabapentin (NEURONTIN) 100 MG capsule Take 2 capsules (200 mg total) by mouth 3 (three) times daily. 03/19/22 05/08/22 Yes Massengill, Ovid Curd, MD  ibuprofen (ADVIL) 600 MG  tablet Take 1 tablet (600 mg total) by mouth every 6 (six) hours as needed. 03/20/22  Yes Redwine, Madison A, PA-C  Multiple Vitamin (MULTIVITAMIN WITH MINERALS) TABS tablet Take 1 tablet by mouth daily. 03/20/22  Yes Massengill, Ovid Curd, MD  rivaroxaban (XARELTO) 20 MG TABS tablet Take 1 tablet (20 mg total) by mouth daily with supper. 03/19/22 05/08/22 Yes Massengill, Ovid Curd, MD  traZODone (DESYREL) 50 MG tablet Take 1 tablet (50 mg total) by mouth at bedtime as needed for sleep. 03/19/22 05/08/22 Yes Massengill, Ovid Curd, MD    Patient Stressors: Financial difficulties   Occupational concerns   Substance abuse   Other: Homeless    Patient Strengths: Average or above average intelligence  General fund of knowledge   Treatment Modalities: Medication Management, Group therapy, Case management,   1 to 1 session with clinician, Psychoeducation, Recreational therapy.   Physician Treatment Plan for Primary and Secondary Diagnosis:  Final diagnoses:  Alcohol abuse with intoxication ([place])  Polysubstance abuse ([place])  Insomnia, unspecified type  Homelessness  Mood disorder ([place])  Chronic anticoagulation   Long Term Goal(s): Improvement in symptoms so as ready for discharge  Short Term Goals: Patient will verbalize feelings in meetings with treatment team members. Patient will attend at least of 50% of the groups daily. Pt will complete the PHQ9 on admission, day 3 and discharge. Patient will participate in completing the [place] Patient will score a low risk of violence for 24 hours prior to discharge Patient will take medications as prescribed daily.  Medication Management: Evaluate patient's response, side effects, and tolerance of medication regimen.  Therapeutic Interventions: 1 to 1 sessions, Unit Group sessions and Medication administration.  Evaluation of Outcomes: Not Met  LCSW Treatment Plan for Primary Diagnosis:  Final diagnoses:  Alcohol abuse with intoxication ([place])  Polysubstance abuse ([place])  Insomnia, unspecified type  Homelessness  Mood disorder ([place])  Chronic anticoagulation    Long Term Goal(s): Safe transition to appropriate next level of care at discharge.  Short Term Goals: Facilitate acceptance of mental health diagnosis and concerns through verbal commitment to aftercare plan and appointments at discharge., Patient will identify one social support prior to discharge to aid in patient's recovery., Patient will attend AA/NA groups as scheduled., Identify minimum of 2 triggers associated with mental health/substance abuse issues with treatment team members., and Increase skills for wellness and recovery by attending 50% of scheduled groups.  Therapeutic Interventions: Assess for all discharge needs, 1 to 1 time with Research officer, political party, Explore available resources and support systems, Assess for adequacy in community support network, Educate family and significant other(s) on suicide prevention, Complete Psychosocial Assessment, Interpersonal group therapy.  Evaluation of Outcomes: Not Met   Progress in Treatment: Attending groups: No. Participating in groups: No. Taking medication as prescribed: No. Toleration medication: No. Family/Significant other contact made: No, will contact:  None indicated Patient understands diagnosis: Yes. Discussing patient identified problems/goals with staff: No. Medical problems stabilized or resolved: Yes. Denies suicidal/homicidal ideation: No. Issues/concerns per patient self-inventory: No. Other: Patient declining further treatment and discharging.  New problem(s) identified: Yes, Describe:  Patient declining treatment and requesting to discharge.  New Short Term/Long Term Goal(s):  None  Patient Goals:  Requesting discharge for vague reasons.  Discharge Plan or Barriers: Resources provided in AVS.  Reason for Continuation of Hospitalization: Other; describe None - patient declining treatment and requesting discharge for vague reasons.  Estimated Length of Stay:  Last 3 [place] Suicide Severity Risk Score:  [place] ED from 05/08/2022 in [facility] ED from 03/20/2022 in [facility] ED to Hosp-Admission (Discharged) from 03/10/2022 in [facility]  C-SSRS RISK CATEGORY High Risk No Risk No Risk       Last PHQ 2/9 Scores:    05/08/2022    9:45 AM 03/12/2021    4:35 PM  Depression screen PHQ 2/9  Decreased Interest 1 2  Down, Depressed, Hopeless 1 3  PHQ - 2 Score 2 5  Altered sleeping 1 3  Tired, decreased energy 1 3  Change in appetite 1 3  Feeling bad or failure about yourself  1 3  Trouble concentrating 1 3  Moving slowly or fidgety/restless 1 2  Suicidal  thoughts 3 0  PHQ-9 Score 11 22  Difficult doing work/chores Somewhat difficult     Scribe for Treatment Team: Kerri Perches, LCSW 05/09/2022 10:14 AM

## 2022-05-09 NOTE — ED Notes (Signed)
Pt is in the bed sleeping. Respirations are even and unlabored. No acute distress noted. Will continue to monitor for safety. 

## 2022-05-09 NOTE — ED Provider Notes (Signed)
FBC/OBS ASAP Discharge Summary  Date and Time: 05/09/2022 5:05 PM  Name: Kristina Owens  MRN:  [id number]   Discharge Diagnoses:  Final diagnoses:  Alcohol abuse with intoxication ([place])  Polysubstance abuse ([place])  Insomnia, unspecified type  Homelessness  Mood disorder ([place])  Chronic anticoagulation    Subjective:  Kristina Owens is a 56 year old female with a past psychiatric history of polysubstance abuse (methamphetamines, crack cocaine, marijuana, and alcohol) who is currently dealing with homelessness.  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  The patient presented to the [facility] behavioral health urgent care complaining of suicidal thoughts without a plan and interested in detox and help with substance use.  Stay Summary:  The day after admission the patient demanded discharge.  She reported "my needs are not being met, medications are given randomly and people do not explain things".  She denied experiencing suicidal thoughts and demonstrated insight into the risks of leaving the facility based crisis while still in the window for withdrawal.  She had full capacity to make her disposition decisions.  She is not in subjective or objective distress at the time of making her decision and was able to voice a clear and consistent preference for being discharged.  Total Time spent with patient: 20 minutes  Past Psychiatric History: as above Past Medical History:  Past Medical History:  Diagnosis Date   Anxiety    Depression    DVT (deep vein thrombosis) in pregnancy    GERD (gastroesophageal reflux disease)    Ovarian cyst     Past Surgical History:  Procedure Laterality Date   APPENDECTOMY  age 4   IR PTA VENOUS EXCEPT DIALYSIS CIRCUIT  01/16/2021   IR RADIOLOGIST EVAL & MGMT  03/19/2021   IR THROMBECT VENO MECH MOD SED  01/16/2021   IR US GUIDE VASC ACCESS LEFT  01/16/2021   IR VENO/EXT/UNI LEFT  01/16/2021   IR  VENOCAVAGRAM IVC  01/16/2021   TONSILLECTOMY Bilateral age 20   Family History:  Family History  Problem Relation Age of Onset   Asthma Mother    COPD Mother    Cancer Father        thyroid cancer   Family Psychiatric History: per H and P Social History:  Social History   Substance and Sexual Activity  Alcohol Use Yes     Social History   Substance and Sexual Activity  Drug Use Yes   Types: "Crack" cocaine, Cocaine, Methamphetamines   Comment: last used 4/31/22    Social History   Socioeconomic History   Marital status: Divorced    Spouse name: Not on file   Number of children: Not on file   Years of education: Not on file   Highest education level: Not on file  Occupational History   Not on file  Tobacco Use   Smoking status: Every Day    Packs/day: 0.50    Years: 35.00    Total pack years: 17.50    Types: Cigarettes   Smokeless tobacco: Never  Vaping Use   Vaping Use: Never used  Substance and Sexual Activity   Alcohol use: Yes   Drug use: Yes    Types: "Crack" cocaine, Cocaine, Methamphetamines    Comment: last used 4/31/22   Sexual activity: Not on file  Other Topics Concern   Not on file  Social History Narrative   Not on file   Social Determinants of Health  Financial Resource Strain: Not on file  Food Insecurity: Not on file  Transportation Needs: Not on file  Physical Activity: Not on file  Stress: Not on file  Social Connections: Not on file   SDOH:  SDOH Screenings   Alcohol Screen: High Risk (03/10/2022)  Depression (PHQ2-9): High Risk (05/08/2022)  Tobacco Use: High Risk (03/20/2022)    Tobacco Cessation:  A prescription for an FDA-approved tobacco cessation medication provided at discharge  Current Medications:  No current facility-administered medications for this encounter.   Current Outpatient Medications  Medication Sig Dispense Refill   ARIPiprazole (ABILIFY) 15 MG tablet Take 1 tablet (15 mg total) by mouth daily. 30 tablet  0   escitalopram (LEXAPRO) 5 MG tablet Take 3 tablets (15 mg total) by mouth daily. 90 tablet 0   gabapentin (NEURONTIN) 100 MG capsule Take 2 capsules (200 mg total) by mouth 3 (three) times daily. 180 capsule 0   ibuprofen (ADVIL) 600 MG tablet Take 1 tablet (600 mg total) by mouth every 6 (six) hours as needed. 30 tablet 0   Multiple Vitamin (MULTIVITAMIN WITH MINERALS) TABS tablet Take 1 tablet by mouth daily.     rivaroxaban (XARELTO) 20 MG TABS tablet Take 1 tablet (20 mg total) by mouth daily with supper. 30 tablet 0   traZODone (DESYREL) 50 MG tablet Take 1 tablet (50 mg total) by mouth at bedtime as needed for sleep. 30 tablet 0    PTA Medications: (Not in a hospital admission)      05/08/2022    9:45 AM 03/12/2021    4:35 PM  Depression screen PHQ 2/9  Decreased Interest 1 2  Down, Depressed, Hopeless 1 3  PHQ - 2 Score 2 5  Altered sleeping 1 3  Tired, decreased energy 1 3  Change in appetite 1 3  Feeling bad or failure about yourself  1 3  Trouble concentrating 1 3  Moving slowly or fidgety/restless 1 2  Suicidal thoughts 3 0  PHQ-9 Score 11 22  Difficult doing work/chores Somewhat difficult     Flowsheet Row ED from 05/08/2022 in [facility] ED from 03/20/2022 in [facility] ED to Hosp-Admission (Discharged) from 03/10/2022 in [facility]  C-SSRS RISK CATEGORY High Risk No Risk No Risk       Musculoskeletal  Strength & Muscle Tone: within normal limits Gait & Station: normal Patient leans: N/A  Psychiatric Specialty Exam  Presentation  General Appearance:  Appropriate for Environment  Eye Contact: Fair  Speech: Clear and Coherent  Speech Volume: Normal  Handedness: Right   Mood and Affect  Mood: Anxious  Affect: Congruent   Thought Process  Thought Processes: Linear  Descriptions of Associations:Intact  Orientation:Full (Time, Place and  Person)  Thought Content:Logical  Diagnosis of Schizophrenia or Schizoaffective disorder in past: No    Hallucinations: denies Ideas of Reference:None  Suicidal Thoughts: denies Homicidal Thoughts:Homicidal Thoughts: No   Sensorium  Memory: Immediate Fair  Judgment: Poor  Insight: Poor   Executive Functions  Concentration: Fair  Attention Span: Fair  Recall: [place] of Knowledge: Fair  Language: Fair   Psychomotor Activity  Psychomotor Activity: Psychomotor Activity: Normal   Assets  Assets: Desire for Improvement; Communication Skills; Physical Health   Sleep  Sleep: Sleep: Poor Number of Hours of Sleep: 2   Nutritional Assessment (For OBS and FBC admissions only) Has the patient had a weight loss or gain of 10  pounds or more in the last 3 months?: No Has the patient had a decrease in food intake/or appetite?: Yes Does the patient have dental problems?: No Does the patient have eating habits or behaviors that may be indicators of an eating disorder including binging or inducing vomiting?: No Has the patient recently lost weight without trying?: 0 Has the patient been eating poorly because of a decreased appetite?: 0 Malnutrition Screening Tool Score: 0    Physical Exam  Physical Exam Constitutional:      Appearance: the patient is not toxic-appearing.  Pulmonary:     Effort: Pulmonary effort is normal.  Neurological:     General: No focal deficit present.     Mental Status: the patient is alert and oriented to person, place, and time.   Review of Systems  Respiratory:  Negative for shortness of breath.   Cardiovascular:  Negative for chest pain.  Gastrointestinal:  Negative for abdominal pain, constipation, diarrhea, nausea and vomiting.  Neurological:  Negative for headaches.   Blood pressure 116/77, pulse 62, temperature 97.6 F (36.4 C), temperature source Oral, resp. rate 17, SpO2 97 %. There is no height or weight on file to  calculate BMI.  Demographic Factors:  Caucasian and Low socioeconomic status  Loss Factors: NA  Historical Factors: NA  Risk Reduction Factors:   Positive social support, Positive therapeutic relationship, and Positive coping skills or problem solving skills  Continued Clinical Symptoms:  Alcohol/Substance Abuse/Dependencies  Cognitive Features That Contribute To Risk:  None    Suicide Risk:  Mild: The patient presented with identifiable suicidal ideation in the context of a substance use disorder.  While ideally the patient would participate in treatment, she is unwilling to at this time and is requesting discharge.  The patient is not appropriate for involuntary commitment and has expressed a clear and consistent preference for discharge.  The patient has shown that previously she is able to reach out for help without self harming.  Plan Of Care/Follow-up recommendations:  Activity: as tolerated  Diet: heart healthy  Other: -Follow-up with your outpatient psychiatric provider - information provided in the discharge instructions.   -Take your psychiatric medications as prescribed at discharge - instructions are provided to you in the discharge paperwork.  -Follow-up with outpatient primary care doctor and other specialists -for management of preventative medicine and chronic medical disease.   -Recommend abstinence from alcohol, tobacco, and other illicit drug use at discharge.   -If your psychiatric symptoms recur, worsen, or if you have side effects to your psychiatric medications, call your outpatient psychiatric provider, [phone] or go to the nearest emergency department.   Disposition: self care  Corky Sox, MD 05/09/2022, 5:05 PM

## 2022-05-09 NOTE — ED Notes (Signed)
SPIRITUALITY GROUP NOTE  Spirituality group facilitated by Simone Curia, MDiv, [place].  Group Description:  Group focused on topic of hope.  Patients participated in facilitated discussion around topic, connecting with one another around experiences and definitions for hope.  Group members engaged with visual explorer photos, reflecting on what hope looks like for them today.  Group engaged in discussion around how their definitions of hope are present today in hospital.   Modalities: Psycho-social ed, Adlerian, Narrative, MI Patient Progress: Present throughout group.  Observant of dialog - engaged when facilitator inquired.

## 2022-05-09 NOTE — Progress Notes (Signed)
Pt is awake, alert and oriented X4. Pt is angry, irritable and labile. Pt refused her AM medications. Pt is demanding to discharged. MD notified. Pt denies current SI/HI/AVH. Staff will monitor for pt's safety.

## 2022-05-10 ENCOUNTER — Encounter (HOSPITAL_COMMUNITY): Payer: Self-pay

## 2022-05-10 ENCOUNTER — Other Ambulatory Visit: Payer: Self-pay

## 2022-05-10 ENCOUNTER — Emergency Department (HOSPITAL_COMMUNITY)
Admission: EM | Admit: 2022-05-10 | Discharge: 2022-05-10 | Disposition: A | Discharge status: Home / Self Care | Payer: Self-pay | Attending: Emergency Medicine | Admitting: Emergency Medicine

## 2022-05-10 DIAGNOSIS — F10920 Alcohol use, unspecified with intoxication, uncomplicated: Secondary | ICD-10-CM

## 2022-05-10 DIAGNOSIS — Z7901 Long term (current) use of anticoagulants: Secondary | ICD-10-CM | POA: Insufficient documentation

## 2022-05-10 DIAGNOSIS — F10220 Alcohol dependence with intoxication, uncomplicated: Secondary | ICD-10-CM | POA: Insufficient documentation

## 2022-05-10 LAB — COMPREHENSIVE METABOLIC PANEL
ALT: 16 U/L (ref 0–44)
AST: 18 U/L (ref 15–41)
Albumin: 3.7 g/dL (ref 3.5–5.0)
Alkaline Phosphatase: 97 U/L (ref 38–126)
Anion gap: 6 (ref 5–15)
BUN: 11 mg/dL (ref 6–20)
CO2: 29 mmol/L (ref 22–32)
Calcium: 8.7 mg/dL — ABNORMAL LOW (ref 8.9–10.3)
Chloride: 102 mmol/L (ref 98–111)
Creatinine, Ser: 0.57 mg/dL (ref 0.44–1.00)
GFR, Estimated: >60 mL/min (ref >60)
Glucose, Bld: 90 mg/dL (ref 70–99)
Potassium: 4.2 mmol/L (ref 3.5–5.1)
Sodium: 137 mmol/L (ref 135–145)
Total Bilirubin: 0.5 mg/dL (ref 0.3–1.2)
Total Protein: 7.2 g/dL (ref 6.5–8.1)

## 2022-05-10 LAB — RAPID URINE DRUG SCREEN, HOSP PERFORMED
Amphetamines: NOT DETECTED
Barbiturates: NOT DETECTED
Benzodiazepines: NOT DETECTED
Cocaine: POSITIVE — AB
Opiates: NOT DETECTED
Tetrahydrocannabinol: NOT DETECTED

## 2022-05-10 LAB — LIPASE, BLOOD: Lipase: 34 U/L (ref 11–51)

## 2022-05-10 LAB — CBC WITH DIFFERENTIAL/PLATELET
Abs Immature Granulocytes: 0.04 10*3/uL (ref 0.00–0.07)
Basophils Absolute: 0 10*3/uL (ref 0.0–0.1)
Basophils Relative: 1 %
Eosinophils Absolute: 0 10*3/uL (ref 0.0–0.5)
Eosinophils Relative: 1 %
HCT: 50.7 % — ABNORMAL HIGH (ref 36.0–46.0)
Hemoglobin: 16.3 g/dL — ABNORMAL HIGH (ref 12.0–15.0)
Immature Granulocytes: 1 %
Lymphocytes Relative: 40 %
Lymphs Abs: 2.6 10*3/uL (ref 0.7–4.0)
MCH: 29.3 pg (ref 26.0–34.0)
MCHC: 32.1 g/dL (ref 30.0–36.0)
MCV: 91.2 fL (ref 80.0–100.0)
Monocytes Absolute: 0.5 10*3/uL (ref 0.1–1.0)
Monocytes Relative: 8 %
Neutro Abs: 3.2 10*3/uL (ref 1.7–7.7)
Neutrophils Relative %: 49 %
Platelets: 250 10*3/uL (ref 150–400)
RBC: 5.56 MIL/uL — ABNORMAL HIGH (ref 3.87–5.11)
RDW: 13.2 % (ref 11.5–15.5)
WBC: 6.4 10*3/uL (ref 4.0–10.5)
nRBC: 0 % (ref 0.0–0.2)

## 2022-05-10 LAB — ETHANOL: Alcohol, Ethyl (B): <10 mg/dL (ref <10)

## 2022-05-10 MED ORDER — ONDANSETRON HCL 4 MG/2ML IJ SOLN
4.0000 mg | Freq: Once | INTRAMUSCULAR | Status: AC
  Administered 2022-05-10: 4 mg via INTRAVENOUS
  Filled 2022-05-10: qty 2

## 2022-05-10 MED ORDER — LACTATED RINGERS IV BOLUS
1000.0000 mL | Freq: Once | INTRAVENOUS | Status: AC
  Administered 2022-05-10: 1000 mL via INTRAVENOUS

## 2022-05-10 NOTE — ED Provider Notes (Signed)
[facility] DEPT Provider Note   CSN: [id number] Arrival date & time: 05/10/22  0019     History  Chief Complaint  Patient presents with   Detox    Kristina Owens is a 56 y.o. female with history of alcoholism, substance abuse disorder, previous DVT/PE, GERD who presents to the emergency department for evaluation of nausea/vomiting due to alcohol use.  Patient states that she drinks "as much as I can" every night.  Her last drink was last night and she drank several 4 Loco's.  Today is complaining of body aches, nausea, vomiting and generalized abdominal pain.  She also states that she took a pill earlier that she thought looked like Tylenol, however it "put her out".  She denies suicidal ideation, homicidal ideation, fevers, chest pain, shortness of breath, tremors.  Denies previous history of alcohol withdrawal seizures.  Requesting help with detox. HPI     Home Medications Prior to Admission medications   Medication Sig Start Date End Date Taking? Authorizing Provider  ARIPiprazole (ABILIFY) 15 MG tablet Take 1 tablet (15 mg total) by mouth daily. 03/20/22 05/08/22  Massengill, Ovid Curd, MD  escitalopram (LEXAPRO) 5 MG tablet Take 3 tablets (15 mg total) by mouth daily. 03/20/22 05/08/22  Massengill, Ovid Curd, MD  gabapentin (NEURONTIN) 100 MG capsule Take 2 capsules (200 mg total) by mouth 3 (three) times daily. 03/19/22 05/08/22  Massengill, Ovid Curd, MD  ibuprofen (ADVIL) 600 MG tablet Take 1 tablet (600 mg total) by mouth every 6 (six) hours as needed. 03/20/22   Redwine, Madison A, PA-C  Multiple Vitamin (MULTIVITAMIN WITH MINERALS) TABS tablet Take 1 tablet by mouth daily. 03/20/22   Massengill, Ovid Curd, MD  rivaroxaban (XARELTO) 20 MG TABS tablet Take 1 tablet (20 mg total) by mouth daily with supper. 03/19/22 05/08/22  Massengill, Ovid Curd, MD  traZODone (DESYREL) 50 MG tablet Take 1 tablet (50 mg total) by mouth at bedtime as needed for sleep. 03/19/22 05/08/22   Janine Limbo, MD      Allergies    Patient has no known allergies.    Review of Systems   Review of Systems  Constitutional:  Negative for fever.    Physical Exam Updated Vital Signs BP 127/84 (BP Location: Right Arm)   Pulse 70   Temp 97.9 F (36.6 C) (Oral)   Resp 16   SpO2 96%  Physical Exam Vitals and nursing note reviewed.  Constitutional:      General: She is not in acute distress.    Appearance: She is ill-appearing.  HENT:     Head: Atraumatic.     Mouth/Throat:     Mouth: Mucous membranes are dry.  Eyes:     Conjunctiva/sclera: Conjunctivae normal.  Cardiovascular:     Rate and Rhythm: Normal rate and regular rhythm.     Pulses: Normal pulses.     Heart sounds: No murmur heard. Pulmonary:     Effort: Pulmonary effort is normal. No respiratory distress.     Breath sounds: Normal breath sounds.  Abdominal:     General: Abdomen is flat. There is no distension.     Palpations: Abdomen is soft.     Tenderness: There is no abdominal tenderness. There is no right CVA tenderness or left CVA tenderness.  Musculoskeletal:        General: Normal range of motion.     Cervical back: Normal range of motion.  Skin:    General: Skin is warm and dry.     Capillary Refill:  Capillary refill takes less than 2 seconds.  Neurological:     General: No focal deficit present.     Mental Status: She is alert.  Psychiatric:        Mood and Affect: Mood normal.     ED Results / Procedures / Treatments   Labs (all labs ordered are listed, but only abnormal results are displayed) Labs Reviewed  RAPID URINE DRUG SCREEN, HOSP PERFORMED - Abnormal; Notable for the following components:      Result Value   Cocaine POSITIVE (*)    All other components within normal limits  COMPREHENSIVE METABOLIC PANEL - Abnormal; Notable for the following components:   Calcium 8.7 (*)    All other components within normal limits  CBC WITH DIFFERENTIAL/PLATELET - Abnormal; Notable for  the following components:   RBC 5.56 (*)    Hemoglobin 16.3 (*)    HCT 50.7 (*)    All other components within normal limits  ETHANOL  LIPASE, BLOOD    EKG EKG Interpretation  Date/Time:  Saturday May 10 2022 08:55:37 EDT Ventricular Rate:  68 PR Interval:  127 QRS Duration: 78 QT Interval:  406 QTC Calculation: 432 R Axis:   82 Text Interpretation: Sinus rhythm No significant change since prior 8/23 Confirmed by Aletta Edouard [id number]) on 05/10/2022 10:01:05 AM  Radiology No results found.  Procedures Procedures    Medications Ordered in ED Medications  lactated ringers bolus 1,000 mL (1,000 mLs Intravenous New Bag/Given 05/10/22 0847)  ondansetron (ZOFRAN) injection 4 mg (4 mg Intravenous Given 05/10/22 0848)    ED Course/ Medical Decision Making/ A&P                           Medical Decision Making Amount and/or Complexity of Data Reviewed Labs: ordered.  Risk Prescription drug management.   Social determinants of health:  Social History   Socioeconomic History   Marital status: Divorced    Spouse name: Not on file   Number of children: Not on file   Years of education: Not on file   Highest education level: Not on file  Occupational History   Not on file  Tobacco Use   Smoking status: Every Day    Packs/day: 0.50    Years: 35.00    Total pack years: 17.50    Types: Cigarettes   Smokeless tobacco: Never  Vaping Use   Vaping Use: Never used  Substance and Sexual Activity   Alcohol use: Yes   Drug use: Yes    Types: "Crack" cocaine, Cocaine, Methamphetamines    Comment: last used 4/31/22   Sexual activity: Not on file  Other Topics Concern   Not on file  Social History Narrative   Not on file   Social Determinants of Health   Financial Resource Strain: Not on file  Food Insecurity: Not on file  Transportation Needs: Not on file  Physical Activity: Not on file  Stress: Not on file  Social Connections: Not on file  Intimate Partner  Violence: Not on file     Initial impression:  This patient presents to the ED for concern of alcohol intoxication requesting help with detox, this involves an extensive number of treatment options, and is a complaint that carries with it a high risk of complications and morbidity.   Differentials include alcohol withdrawal symptoms, delirium, alcohol intoxication, electrolyte abnormality.   Comorbidities affecting care:  Per HPI  Additional history obtained: Previous labs  Lab Tests  I Ordered, reviewed, and interpreted labs and EKG.  The pertinent results include:  CMP without acute findings CBC with some hemoconcentration Lipase normal Ethanol normal UDS positive for cocaine  EKG: Normal sinus rhythm   Medicines ordered and prescription drug management:  I ordered medication including: 1 L LR bolus Zofran 4 mg IV Reevaluation of the patient after these medicines showed that the patient improved I have reviewed the patients home medicines and have made adjustments as needed    ED Course/Re-evaluation: Patient presents in no acute distress and is nontoxic-appearing.  Vitals are without significant abnormality.  On exam, she appears tired.  Mucous membranes are little dry.  Physical exam otherwise benign.  No evidence of tremors.  CIWA score 0.  She was given 1 L LR bolus along with Zofran 4 mg.  Labs overall reassuring with some evidence of hemoconcentration consistent with dehydration.  She was also positive for cocaine.  She does have previous history of cocaine use, so whether this is positive due to intentional cocaine usage versus it being the unknown pill that she took this morning is difficult to determine.  EKG normal.  Patient is stable for discharge.  She was provided with contact information for both outpatient and residential substance abuse treatment.  Patient is agreeable to plan. Disposition:  After consideration of the diagnostic results, physical exam,  history and the patients response to treatment feel that the patent would benefit from discharge.   Alcohol intoxication: Plan and management as described above. Discharged home in good condition.   Final Clinical Impression(s) / ED Diagnoses Final diagnoses:  Alcoholic intoxication without complication [facility])    Rx / DC Orders ED Discharge Orders     None         Rodena Piety 05/10/22 1033    Hayden Rasmussen, MD 05/10/22 1711

## 2022-05-10 NOTE — ED Triage Notes (Signed)
BIB [facility] requesting detox. C/o body aches, nausea, and vomiting. States she took a pill earlier that she thought was tylenol that she bought from someone on the street and endorses ETOH use.

## 2022-05-10 NOTE — ED Notes (Signed)
Gave pt [place] sandwich and ginger ale.  Pt states nausea is some better after Zofran.

## 2022-05-10 NOTE — Discharge Instructions (Addendum)
Clark your labs were all very reassuring today.  There is only mild evidence of dehydration.  You did test positive for cocaine, whether you did this intentionally or was the pill that you took this morning I cannot determine.  I have given you some resources for outpatient or inpatient substance abuse rehab should you choose to call any of them.

## 2022-06-05 ENCOUNTER — Encounter (HOSPITAL_COMMUNITY): Payer: Self-pay

## 2022-06-05 ENCOUNTER — Emergency Department (HOSPITAL_COMMUNITY)
Admission: EM | Admit: 2022-06-05 | Discharge: 2022-06-05 | Disposition: A | Discharge status: Home / Self Care | Payer: Medicaid Other | Attending: Emergency Medicine | Admitting: Emergency Medicine

## 2022-06-05 DIAGNOSIS — Z7901 Long term (current) use of anticoagulants: Secondary | ICD-10-CM | POA: Insufficient documentation

## 2022-06-05 DIAGNOSIS — Z79899 Other long term (current) drug therapy: Secondary | ICD-10-CM | POA: Insufficient documentation

## 2022-06-05 DIAGNOSIS — F1910 Other psychoactive substance abuse, uncomplicated: Secondary | ICD-10-CM

## 2022-06-05 DIAGNOSIS — M79601 Pain in right arm: Secondary | ICD-10-CM | POA: Insufficient documentation

## 2022-06-05 DIAGNOSIS — F1510 Other stimulant abuse, uncomplicated: Secondary | ICD-10-CM | POA: Insufficient documentation

## 2022-06-05 LAB — COMPREHENSIVE METABOLIC PANEL
ALT: 17 U/L (ref 0–44)
AST: 20 U/L (ref 15–41)
Albumin: 3.9 g/dL (ref 3.5–5.0)
Alkaline Phosphatase: 120 U/L (ref 38–126)
Anion gap: 8 (ref 5–15)
BUN: 8 mg/dL (ref 6–20)
CO2: 26 mmol/L (ref 22–32)
Calcium: 8.8 mg/dL — ABNORMAL LOW (ref 8.9–10.3)
Chloride: 105 mmol/L (ref 98–111)
Creatinine, Ser: 0.5 mg/dL (ref 0.44–1.00)
GFR, Estimated: >60 mL/min (ref >60)
Glucose, Bld: 114 mg/dL — ABNORMAL HIGH (ref 70–99)
Potassium: 3.7 mmol/L (ref 3.5–5.1)
Sodium: 139 mmol/L (ref 135–145)
Total Bilirubin: 0.5 mg/dL (ref 0.3–1.2)
Total Protein: 7.1 g/dL (ref 6.5–8.1)

## 2022-06-05 LAB — RAPID URINE DRUG SCREEN, HOSP PERFORMED
Amphetamines: POSITIVE — AB
Barbiturates: NOT DETECTED
Benzodiazepines: NOT DETECTED
Cocaine: NOT DETECTED
Opiates: NOT DETECTED
Tetrahydrocannabinol: NOT DETECTED

## 2022-06-05 LAB — CBC
HCT: 46 % (ref 36.0–46.0)
Hemoglobin: 15.1 g/dL — ABNORMAL HIGH (ref 12.0–15.0)
MCH: 30.2 pg (ref 26.0–34.0)
MCHC: 32.8 g/dL (ref 30.0–36.0)
MCV: 92 fL (ref 80.0–100.0)
Platelets: 248 10*3/uL (ref 150–400)
RBC: 5 MIL/uL (ref 3.87–5.11)
RDW: 13.6 % (ref 11.5–15.5)
WBC: 4.9 10*3/uL (ref 4.0–10.5)
nRBC: 0 % (ref 0.0–0.2)

## 2022-06-05 LAB — ETHANOL: Alcohol, Ethyl (B): <10 mg/dL (ref <10)

## 2022-06-05 MED ORDER — ACETAMINOPHEN 500 MG PO TABS
1000.0000 mg | ORAL_TABLET | Freq: Once | ORAL | Status: AC
  Administered 2022-06-05: 1000 mg via ORAL
  Filled 2022-06-05: qty 2

## 2022-06-05 MED ORDER — IBUPROFEN 200 MG PO TABS
400.0000 mg | ORAL_TABLET | Freq: Once | ORAL | Status: AC
  Administered 2022-06-05: 400 mg via ORAL
  Filled 2022-06-05: qty 2

## 2022-06-05 NOTE — Discharge Instructions (Addendum)
It was our pleasure to provide your ER care today - we hope that you feel better.  Avoid drug use as it is harmful to your physical health and mental well-being. See resource guide attached in terms of accessing inpatient or outpatient substance use treatment programs.   Follow up closely with primary care doctor and behavioral health provider in the coming week.  For mental health issues and/or crisis, you may also go directly to the [facility] - they are open 24/7 and walk-ins are welcome.    Return to ER if worse, new symptoms, fevers, chest pain, trouble breathing, or other emergency concern.   

## 2022-06-05 NOTE — ED Provider Notes (Signed)
[facility] DEPT Provider Note   CSN: [id number] Arrival date & time: 06/05/22  0753     History  Chief Complaint  Patient presents with   Arm Pain    Kristina Owens is a 56 y.o. female.  Pt c/o being achy all over. Pt limited historian - level 5 caveat. Pt indicates 'I think maybe someone gave me some bad dope a couple days ago'.  No fever/chills/sweats.  No focal area of pain or discomfort. Denies trauma/fall. No headache. No chest pain or sob. No fever. Pt not on anticoag therapy, no abn bruising or bleeding.   The history is provided by the patient, medical records and the EMS personnel. The history is limited by the condition of the patient.  Arm Pain Pertinent negatives include no chest pain, no abdominal pain, no headaches and no shortness of breath.       Home Medications Prior to Admission medications   Medication Sig Start Date End Date Taking? Authorizing Provider  ARIPiprazole (ABILIFY) 15 MG tablet Take 1 tablet (15 mg total) by mouth daily. 03/20/22 05/08/22  Massengill, Ovid Curd, MD  escitalopram (LEXAPRO) 5 MG tablet Take 3 tablets (15 mg total) by mouth daily. 03/20/22 05/08/22  Massengill, Ovid Curd, MD  gabapentin (NEURONTIN) 100 MG capsule Take 2 capsules (200 mg total) by mouth 3 (three) times daily. 03/19/22 05/08/22  Massengill, Ovid Curd, MD  ibuprofen (ADVIL) 600 MG tablet Take 1 tablet (600 mg total) by mouth every 6 (six) hours as needed. 03/20/22   Redwine, Madison A, PA-C  Multiple Vitamin (MULTIVITAMIN WITH MINERALS) TABS tablet Take 1 tablet by mouth daily. 03/20/22   Massengill, Ovid Curd, MD  rivaroxaban (XARELTO) 20 MG TABS tablet Take 1 tablet (20 mg total) by mouth daily with supper. 03/19/22 05/08/22  Massengill, Ovid Curd, MD  traZODone (DESYREL) 50 MG tablet Take 1 tablet (50 mg total) by mouth at bedtime as needed for sleep. 03/19/22 05/08/22  Janine Limbo, MD      Allergies    Patient has no known allergies.    Review of  Systems   Review of Systems  Constitutional:  Negative for chills and fever.  HENT:  Negative for sore throat.   Eyes:  Negative for visual disturbance.  Respiratory:  Negative for cough and shortness of breath.   Cardiovascular:  Negative for chest pain.  Gastrointestinal:  Negative for abdominal pain and vomiting.  Genitourinary:  Negative for dysuria.  Musculoskeletal:  Positive for myalgias. Negative for neck pain.  Skin:  Negative for rash.  Neurological:  Negative for headaches.  Hematological:  Does not bruise/bleed easily.  Psychiatric/Behavioral:  Negative for confusion.     Physical Exam Updated Vital Signs BP 125/74 (BP Location: Left Arm)   Pulse 73   Temp 97.8 F (36.6 C) (Oral)   Resp 15   Ht 1.6 m (5\' 3" )   SpO2 100%   BMI 20.73 kg/m  Physical Exam Vitals and nursing note reviewed.  Constitutional:      Appearance: Normal appearance. She is well-developed.  HENT:     Head: Atraumatic.     Nose: Nose normal.     Mouth/Throat:     Mouth: Mucous membranes are moist.  Eyes:     General: No scleral icterus.    Conjunctiva/sclera: Conjunctivae normal.     Pupils: Pupils are equal, round, and reactive to light.  Neck:     Trachea: No tracheal deviation.  Cardiovascular:     Rate and Rhythm: Normal rate and  regular rhythm.     Pulses: Normal pulses.     Heart sounds: Normal heart sounds. No murmur heard.    No friction rub. No gallop.  Pulmonary:     Effort: Pulmonary effort is normal. No respiratory distress.     Breath sounds: Normal breath sounds.  Abdominal:     General: Bowel sounds are normal. There is no distension.     Palpations: Abdomen is soft.     Tenderness: There is no abdominal tenderness.  Genitourinary:    Comments: No cva tenderness.  Musculoskeletal:        General: No swelling.     Cervical back: Normal range of motion and neck supple. No rigidity. No muscular tenderness.     Comments: No extremity redness/cellulitis or other acute  abnormality noted.   Skin:    General: Skin is warm and dry.     Findings: No rash.  Neurological:     Mental Status: She is alert.     Comments: Alert, speech normal. Motor/sens grossly intact. Steady gait. No tremor or shakes.   Psychiatric:        Mood and Affect: Mood normal.     Comments: Pt with normal mood/affect. Does not appear to be acutely depressed or despondent. On thoughts of harm to self or others. Pt does not appear to be responding to internal stimuli - no delusions or hallucinations noted.      ED Results / Procedures / Treatments   Labs (all labs ordered are listed, but only abnormal results are displayed) Results for orders placed or performed during the hospital encounter of 06/05/22  Comprehensive metabolic panel  Result Value Ref Range   Sodium 139 135 - 145 mmol/L   Potassium 3.7 3.5 - 5.1 mmol/L   Chloride 105 98 - 111 mmol/L   CO2 26 22 - 32 mmol/L   Glucose, Bld 114 (H) 70 - 99 mg/dL   BUN 8 6 - 20 mg/dL   Creatinine, Ser 5.36 0.44 - 1.00 mg/dL   Calcium 8.8 (L) 8.9 - 10.3 mg/dL   Total Protein 7.1 6.5 - 8.1 g/dL   Albumin 3.9 3.5 - 5.0 g/dL   AST 20 15 - 41 U/L   ALT 17 0 - 44 U/L   Alkaline Phosphatase 120 38 - 126 U/L   Total Bilirubin 0.5 0.3 - 1.2 mg/dL   GFR, Estimated >64 >40 mL/min   Anion gap 8 5 - 15  CBC  Result Value Ref Range   WBC 4.9 4.0 - 10.5 K/uL   RBC 5.00 3.87 - 5.11 MIL/uL   Hemoglobin 15.1 (H) 12.0 - 15.0 g/dL   HCT 34.7 42.5 - 95.6 %   MCV 92.0 80.0 - 100.0 fL   MCH 30.2 26.0 - 34.0 pg   MCHC 32.8 30.0 - 36.0 g/dL   RDW 38.7 56.4 - 33.2 %   Platelets 248 150 - 400 K/uL   nRBC 0.0 0.0 - 0.2 %  Rapid urine drug screen (hospital performed)  Result Value Ref Range   Opiates NONE DETECTED NONE DETECTED   Cocaine NONE DETECTED NONE DETECTED   Benzodiazepines NONE DETECTED NONE DETECTED   Amphetamines POSITIVE (A) NONE DETECTED   Tetrahydrocannabinol NONE DETECTED NONE DETECTED   Barbiturates NONE DETECTED NONE DETECTED   Ethanol  Result Value Ref Range   Alcohol, Ethyl (B) <10 <10 mg/dL     EKG None  Radiology No results found.  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 Labs: ordered. Decision-making details documented in ED Course.  Risk OTC drugs. Prescription drug management. Decision regarding hospitalization.   Labs sent.  Diff dx includes sud, dehydration/aki, symptomatic anemia, etc. -  dispo decision including potential need for admission if significant electrolyte abn, aki or anemia considered - will get labs and reassess.   Reviewed nursing notes and prior charts for additional history.   Po fluids/food.   Labs reviewed/interpreted by me - chem normal. Hgb normal. Uds +meth.  Acetaminophen po/ibuprofen po.  Pt tolerating po, no distress.   Pt currently appears stable for d/c.   Rec close outpatient pcp and bhSUD tx f/u, and will also provide social service resources in the community.  Return precautions provided.           Final Clinical Impression(s) / ED Diagnoses Final diagnoses:  None    Rx / DC Orders ED Discharge Orders     None         Cathren Laine, MD 06/05/22 1002

## 2022-06-05 NOTE — ED Notes (Signed)
Pt was provided food and drink with her PO medications.

## 2022-06-05 NOTE — ED Triage Notes (Signed)
Pt [place] for pain all over. "Tired of living the drug and alcohol life." Last injection 2 days ago. C/o mild redness on right arm at site of last injection. No swelling noted.   126/77 HR 72 16 RR 100% RA CBG 201

## 2022-06-05 NOTE — ED Notes (Signed)
Entered pts room to discuss discharge papers. Pt initially refused to acknowledge presence of RN. Had to called pts name twice for her to respond. Pt then responded "I need help getting dressed". Reminded pt that she had undressed herself when she first arrived to the room and had changed herself into a gown. Questioning if pt condition had changed which would require her needing assistance to get dressed. Pt then began yelling at nurse stating "Well I'm going to fucking lay here until somebody helps me get dressed." Told pt it is unnecessary for her to yell and curse. Pt continued to yell. Informed pt that security would be called at this time. Pt then continued yelling threatening nurse. Nurse exited room and requested security presence in order to discharge pt.

## 2022-06-06 ENCOUNTER — Encounter (HOSPITAL_COMMUNITY): Payer: Self-pay | Admitting: Emergency Medicine

## 2022-06-06 ENCOUNTER — Other Ambulatory Visit: Payer: Self-pay

## 2022-06-06 ENCOUNTER — Emergency Department (HOSPITAL_COMMUNITY)
Admission: EM | Admit: 2022-06-06 | Discharge: 2022-06-06 | Disposition: A | Discharge status: Home / Self Care | Payer: No Payment, Other | Attending: Emergency Medicine | Admitting: Emergency Medicine

## 2022-06-06 ENCOUNTER — Ambulatory Visit (HOSPITAL_COMMUNITY)
Admission: EM | Admit: 2022-06-06 | Discharge: 2022-06-06 | Disposition: A | Discharge status: Other Acute Inpatient Hospital | Payer: No Payment, Other

## 2022-06-06 ENCOUNTER — Ambulatory Visit (HOSPITAL_COMMUNITY)
Admission: AD | Admit: 2022-06-06 | Discharge: 2022-06-06 | Disposition: A | Discharge status: Home / Self Care | Payer: Federal, State, Local not specified - Other | Attending: Psychiatry | Admitting: Psychiatry

## 2022-06-06 DIAGNOSIS — F10120 Alcohol abuse with intoxication, uncomplicated: Secondary | ICD-10-CM | POA: Insufficient documentation

## 2022-06-06 DIAGNOSIS — R45851 Suicidal ideations: Secondary | ICD-10-CM | POA: Insufficient documentation

## 2022-06-06 DIAGNOSIS — F10129 Alcohol abuse with intoxication, unspecified: Secondary | ICD-10-CM | POA: Diagnosis present

## 2022-06-06 DIAGNOSIS — Y900 Blood alcohol level of less than 20 mg/100 ml: Secondary | ICD-10-CM | POA: Insufficient documentation

## 2022-06-06 DIAGNOSIS — N9489 Other specified conditions associated with female genital organs and menstrual cycle: Secondary | ICD-10-CM | POA: Insufficient documentation

## 2022-06-06 LAB — I-STAT BETA HCG BLOOD, ED (MC, WL, AP ONLY): I-stat hCG, quantitative: <5 m[IU]/mL (ref <5)

## 2022-06-06 LAB — CBC
HCT: 43.9 % (ref 36.0–46.0)
Hemoglobin: 14.5 g/dL (ref 12.0–15.0)
MCH: 30.5 pg (ref 26.0–34.0)
MCHC: 33 g/dL (ref 30.0–36.0)
MCV: 92.2 fL (ref 80.0–100.0)
Platelets: 224 10*3/uL (ref 150–400)
RBC: 4.76 MIL/uL (ref 3.87–5.11)
RDW: 13.5 % (ref 11.5–15.5)
WBC: 6 10*3/uL (ref 4.0–10.5)
nRBC: 0 % (ref 0.0–0.2)

## 2022-06-06 LAB — COMPREHENSIVE METABOLIC PANEL
ALT: 17 U/L (ref 0–44)
AST: 20 U/L (ref 15–41)
Albumin: 3.6 g/dL (ref 3.5–5.0)
Alkaline Phosphatase: 111 U/L (ref 38–126)
Anion gap: 11 (ref 5–15)
BUN: 6 mg/dL (ref 6–20)
CO2: 26 mmol/L (ref 22–32)
Calcium: 9 mg/dL (ref 8.9–10.3)
Chloride: 103 mmol/L (ref 98–111)
Creatinine, Ser: 0.54 mg/dL (ref 0.44–1.00)
GFR, Estimated: >60 mL/min (ref >60)
Glucose, Bld: 97 mg/dL (ref 70–99)
Potassium: 3.2 mmol/L — ABNORMAL LOW (ref 3.5–5.1)
Sodium: 140 mmol/L (ref 135–145)
Total Bilirubin: 0.3 mg/dL (ref 0.3–1.2)
Total Protein: 6.5 g/dL (ref 6.5–8.1)

## 2022-06-06 LAB — ETHANOL: Alcohol, Ethyl (B): 18 mg/dL — ABNORMAL HIGH (ref <10)

## 2022-06-06 NOTE — ED Notes (Addendum)
Regulatory affairs officer Tosin at [facility], reports pt is to be  transferred to [facility] ED via [facility].  [facility] called to transport pt to [facility] ED.  NP Erasmo Score and Antares notified.

## 2022-06-06 NOTE — ED Notes (Signed)
Pt refused recheck of vital signs.

## 2022-06-06 NOTE — ED Notes (Signed)
Pt changed into burgundy scrubs and belongings removed from room

## 2022-06-06 NOTE — ED Notes (Signed)
Refused to answer pain scale.

## 2022-06-06 NOTE — Consult Note (Addendum)
[facility] Psych ED Discharge  06/06/2022 12:02 PM Kristina Owens  MRN:  [id number]  Principal Problem: Alcohol abuse with intoxication [facility]) Discharge Diagnoses: Principal Problem:   Alcohol abuse with intoxication ([place])  Clinical Impression:  Final diagnoses:  Suicidal ideation   Subjective: Kristina Owens is a 56 year old Caucasian female seen and evaluated face-to-face by this provider.  Reporting chronic suicidal ideations and auditory hallucinations.  Denied voices are command in nature.  Reports " I thought about asking somebody to inject heroin in my veins to kill me."  Reports her suicidal ideations are intermittent and negative.  Reports she is currently homeless.  Reports a recent inpatient admission however denied that she is followed up with therapy or psychiatry.  States she was prescribed Lexapro, Abilify and gabapentin.  Denies that she has been compliant with medication. UDS is positive for amphetamines. ETOH 18 on admission.   Kristina Owens reports she has a 74 year old son, reports a strained relationship.  States she was supposed to follow-up with [name] a few months ago however has not made any follow-up appointments.  Reports substance abuse with methamphetamines, alcohol and cocaine.  During evaluation [facility] is sitting  in no acute distress. She is alert/oriented x 3; calm/cooperative; and mood congruent with affect. She is speaking in a clear tone at moderate volume, and normal pace; with minimal eye contact.Her thought process is coherent and relevant; There is no indication that she is currently responding to internal/external stimuli or experiencing delusional thought content; and she has denied homicidal ideation, psychosis, and paranoia.  Patient has remained calm throughout assessment and has answered questions appropriately.     ED Assessment Time Calculation: Start Time: 1100 Stop Time: 1139 Total Time in Minutes (Assessment Completion): 77   Past  Psychiatric History:   Past Medical History:  Past Medical History:  Diagnosis Date   Anxiety    Depression    DVT (deep vein thrombosis) in pregnancy    GERD (gastroesophageal reflux disease)    Ovarian cyst     Past Surgical History:  Procedure Laterality Date   APPENDECTOMY  age 19   IR PTA VENOUS EXCEPT DIALYSIS CIRCUIT  01/16/2021   IR RADIOLOGIST EVAL & MGMT  03/19/2021   IR THROMBECT VENO MECH MOD SED  01/16/2021   IR US GUIDE VASC ACCESS LEFT  01/16/2021   IR VENO/EXT/UNI LEFT  01/16/2021   IR VENOCAVAGRAM IVC  01/16/2021   TONSILLECTOMY Bilateral age 89   Family History:  Family History  Problem Relation Age of Onset   Asthma Mother    COPD Mother    Cancer Father        thyroid cancer   Family Psychiatric  History:  Social History:  Social History   Substance and Sexual Activity  Alcohol Use Yes     Social History   Substance and Sexual Activity  Drug Use Yes   Types: "Crack" cocaine, Cocaine, Methamphetamines   Comment: last used 4/31/22    Social History   Socioeconomic History   Marital status: Divorced    Spouse name: Not on file   Number of children: Not on file   Years of education: Not on file   Highest education level: Not on file  Occupational History   Not on file  Tobacco Use   Smoking status: Every Day    Packs/day: 0.50    Years: 35.00    Total pack years: 17.50    Types: Cigarettes   Smokeless tobacco: Never  Vaping  Use   Vaping Use: Never used  Substance and Sexual Activity   Alcohol use: Yes   Drug use: Yes    Types: "Crack" cocaine, Cocaine, Methamphetamines    Comment: last used 4/31/22   Sexual activity: Not on file  Other Topics Concern   Not on file  Social History Narrative   Not on file   Social Determinants of Health   Financial Resource Strain: Not on file  Food Insecurity: Not on file  Transportation Needs: Not on file  Physical Activity: Not on file  Stress: Not on file  Social Connections: Not on file     Tobacco Cessation:  N/A, patient does not currently use tobacco products  Current Medications: No current facility-administered medications for this encounter.   Current Outpatient Medications  Medication Sig Dispense Refill   ARIPiprazole (ABILIFY) 15 MG tablet Take 1 tablet (15 mg total) by mouth daily. 30 tablet 0   escitalopram (LEXAPRO) 5 MG tablet Take 3 tablets (15 mg total) by mouth daily. 90 tablet 0   gabapentin (NEURONTIN) 100 MG capsule Take 2 capsules (200 mg total) by mouth 3 (three) times daily. 180 capsule 0   ibuprofen (ADVIL) 600 MG tablet Take 1 tablet (600 mg total) by mouth every 6 (six) hours as needed. 30 tablet 0   Multiple Vitamin (MULTIVITAMIN WITH MINERALS) TABS tablet Take 1 tablet by mouth daily.     rivaroxaban (XARELTO) 20 MG TABS tablet Take 1 tablet (20 mg total) by mouth daily with supper. 30 tablet 0   traZODone (DESYREL) 50 MG tablet Take 1 tablet (50 mg total) by mouth at bedtime as needed for sleep. 30 tablet 0   PTA Medications: (Not in a hospital admission)   [place] Scale:  [place] ED from 06/06/2022 in [place] Most recent reading at 06/06/2022  2:15 AM OP Visit from 06/06/2022 in [facility] Most recent reading at 06/06/2022 12:19 AM ED from 06/05/2022 in [facility] DEPT Most recent reading at 06/05/2022  9:21 AM  C-SSRS RISK CATEGORY No Risk No Risk No Risk       Musculoskeletal: Seen resting in bed  Psychiatric Specialty Exam: Presentation  General Appearance:  Disheveled  Eye Contact: Good  Speech: Clear and Coherent  Speech Volume: Normal  Handedness: Right   Mood and Affect  Mood: Depressed; Anxious  Affect: Congruent   Thought Process  Thought Processes: Coherent  Descriptions of Associations:Intact  Orientation:Full (Time, Place and Person)  Thought Content:Logical  History of  Schizophrenia/Schizoaffective disorder:No  Duration of Psychotic Symptoms:No data recorded Hallucinations:Hallucinations: Auditory  Ideas of Reference:None  Suicidal Thoughts:Suicidal Thoughts: Yes, Passive SI Passive Intent and/or Plan: Without Intent  Homicidal Thoughts:Homicidal Thoughts: No   Sensorium  Memory: Immediate Good; Recent Good; Remote Fair  Judgment: Fair  Insight: Good   Executive Functions  Concentration: Fair  Attention Span: Good  Recall: Good  Fund of Knowledge: Fair  Language: Good   Psychomotor Activity  Psychomotor Activity: Psychomotor Activity: Normal   Assets  Assets: Desire for Improvement; Social Support   Sleep  Sleep: Sleep: Fair    Physical Exam: Physical Exam Vitals reviewed.  Cardiovascular:     Rate and Rhythm: Normal rate and regular rhythm.  Neurological:     Mental Status: She is alert.  Psychiatric:        Mood and Affect: Mood normal.        Behavior: Behavior normal.  Thought Content: Thought content normal.    Review of Systems  Psychiatric/Behavioral:  Positive for depression, hallucinations, substance abuse and suicidal ideas. The patient is nervous/anxious.   All other systems reviewed and are negative.  Blood pressure 128/89, pulse 64, temperature (!) 97.4 F (36.3 C), temperature source Oral, resp. rate 16, height 5\' 3"  (1.6 m), weight 53 kg, SpO2 97 %. Body mass index is 20.7 kg/m.   Demographic Factors:  Caucasian  Loss Factors: Financial problems/change in socioeconomic status  Historical Factors: Family history of mental illness or substance abuse and Impulsivity  Risk Reduction Factors:   NA  Continued Clinical Symptoms:  Alcohol/Substance Abuse/Dependencies  Cognitive Features That Contribute To Risk:  Closed-mindedness    Suicide Risk:  Minimal: No identifiable suicidal ideation.  Patients presenting with no risk factors but with morbid ruminations; may be  classified as minimal risk based on the severity of the depressive symptoms   Plan Of Care/Follow-up recommendations:  Activity:  as tolerated Diet:  heart healthy  Problem 1: Substance Abuse/ Use Disorder -Follow-up with [facility], [facility] and or [name]  Problem 2: Chronic Suicidal ideations  Disposition: Offer patient [facility] urgent care facility for overnight observation and will restart medications to reassess symptom improvement.  Patient declined will recommend that she keep all outpatient follow-up appointments  No evidence of imminent risk to self or others at present.   Patient does not meet criteria for psychiatric inpatient admission. Supportive therapy provided about ongoing stressors. Refer to IOP. Discussed crisis plan, support from social network, calling 911, coming to the Emergency Department, and calling Suicide Hotline.  Derrill [place], NP 06/06/2022, 12:02 PM

## 2022-06-06 NOTE — Care Management (Signed)
SA and Homeless resources added to AVS. CHW information added to patient instructions.

## 2022-06-06 NOTE — ED Provider Notes (Signed)
[facility] EMERGENCY DEPARTMENT Provider Note   CSN: [id number] Arrival date & time: 06/06/22  0205     History  Chief Complaint  Patient presents with   Detox : ETOH/Meth    Kristina Owens is a 56 y.o. female.  56 year old female with prior medical history as detailed below presents for evaluation.  Patient with recent evaluations both at [facility] long and at behavioral [facility].  Patient is complaining of suicidal ideation.  She is without other acute medical complaint.  The history is provided by the patient and medical records.       Home Medications Prior to Admission medications   Medication Sig Start Date End Date Taking? Authorizing Provider  ARIPiprazole (ABILIFY) 15 MG tablet Take 1 tablet (15 mg total) by mouth daily. 03/20/22 05/08/22  Massengill, Harrold Donath, MD  escitalopram (LEXAPRO) 5 MG tablet Take 3 tablets (15 mg total) by mouth daily. 03/20/22 05/08/22  Massengill, Harrold Donath, MD  gabapentin (NEURONTIN) 100 MG capsule Take 2 capsules (200 mg total) by mouth 3 (three) times daily. 03/19/22 05/08/22  Massengill, Harrold Donath, MD  ibuprofen (ADVIL) 600 MG tablet Take 1 tablet (600 mg total) by mouth every 6 (six) hours as needed. 03/20/22   Redwine, Madison A, PA-C  Multiple Vitamin (MULTIVITAMIN WITH MINERALS) TABS tablet Take 1 tablet by mouth daily. 03/20/22   Massengill, Harrold Donath, MD  rivaroxaban (XARELTO) 20 MG TABS tablet Take 1 tablet (20 mg total) by mouth daily with supper. 03/19/22 05/08/22  Massengill, Harrold Donath, MD  traZODone (DESYREL) 50 MG tablet Take 1 tablet (50 mg total) by mouth at bedtime as needed for sleep. 03/19/22 05/08/22  Phineas Inches, MD      Allergies    Patient has no known allergies.    Review of Systems   Review of Systems  All other systems reviewed and are negative.   Physical Exam Updated Vital Signs BP 128/89   Pulse 64   Temp (!) 97.4 F (36.3 C) (Oral)   Resp 16   SpO2 97%  Physical Exam Vitals and  nursing note reviewed.  Constitutional:      General: She is not in acute distress.    Appearance: Normal appearance. She is well-developed.  HENT:     Head: Normocephalic and atraumatic.  Eyes:     Conjunctiva/sclera: Conjunctivae normal.     Pupils: Pupils are equal, round, and reactive to light.  Cardiovascular:     Rate and Rhythm: Normal rate and regular rhythm.     Heart sounds: Normal heart sounds.  Pulmonary:     Effort: Pulmonary effort is normal. No respiratory distress.     Breath sounds: Normal breath sounds.  Abdominal:     General: There is no distension.     Palpations: Abdomen is soft.     Tenderness: There is no abdominal tenderness.  Musculoskeletal:        General: No deformity. Normal range of motion.     Cervical back: Normal range of motion and neck supple.  Skin:    General: Skin is warm and dry.  Neurological:     General: No focal deficit present.     Mental Status: She is alert and oriented to person, place, and time.  Psychiatric:     Comments: Endorses SI without specific plan.     ED Results / Procedures / Treatments   Labs (all labs ordered are listed, but only abnormal results are displayed) Labs Reviewed  COMPREHENSIVE METABOLIC PANEL - Abnormal; Notable for  the following components:      Result Value   Potassium 3.2 (*)    All other components within normal limits  ETHANOL - Abnormal; Notable for the following components:   Alcohol, Ethyl (B) 18 (*)    All other components within normal limits  CBC  RAPID URINE DRUG SCREEN, HOSP PERFORMED  I-STAT BETA HCG BLOOD, ED ([facility], [facility], [facility] ONLY)    EKG None  Radiology No results found.  Procedures Procedures    Medications Ordered in ED Medications - No data to display  ED Course/ Medical Decision Making/ A&P                           Medical Decision Making Amount and/or Complexity of Data Reviewed Labs: ordered.    Medical Screen Complete  This patient presented to the ED  with complaint of suicidal ideation.  This complaint involves an extensive number of treatment options. The initial differential diagnosis includes, but is not limited to, suicidal ideation, polysubstance abuse, homelessness, mental health disorder, etc.  This presentation is: Acute, Chronic, Self-Limited, Previously Undiagnosed, Uncertain Prognosis, Complicated, Systemic Symptoms, and Threat to Life/Bodily Function  Patient with longstanding history of alcoholism, polysubstance abuse, homelessness presents with complaint of SI.  Patient is medically clear for psychiatric evaluation.  Final disposition dependent upon psychiatric plan of care.  Additional history obtained:  External records from outside sources obtained and reviewed including prior ED visits and prior Inpatient records.    Lab Tests:  I ordered and personally interpreted labs.  The pertinent results include: CBC, CMP, EtOH, urine drug screen, hCG   Problem List / ED Course:  Suicidal ideation   Reevaluation:  After the interventions noted above, I reevaluated the patient and found that they have: stayed the same   Social Determinants of Health:  Homelessness, polysubstance abuse, chronic alcoholism   Disposition:  After consideration of the diagnostic results and the patients response to treatment, I feel that the patent would benefit from psychiatric evaluation and treatment.          Final Clinical Impression(s) / ED Diagnoses Final diagnoses:  Suicidal ideation    Rx / DC Orders ED Discharge Orders     None         Valarie Merino, MD 06/06/22 [phone]

## 2022-06-06 NOTE — ED Triage Notes (Signed)
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 Seen at [facility] ER yesterday .

## 2022-06-06 NOTE — Discharge Instructions (Addendum)
Intensive Outpatient Programs  [facility]    [facility] [address]     [address] [place],  [place]     [place], [place] [phone]      [phone]  [facility]   [facility]  (Inpatient and outpatient)  [phone] (Suboxone and Methadone) [address]           [phone]           [facility]: [facility]    [facility] - Intensive Outpatient [address]     [address] [place], [place] [address]     [place], [place]  [phone]      [phone]  [facility] (Outpatient, Inpatient, Chemical  [facility] (Groups and Residental) (insurance only) [phone]    [place], [place]          [phone]       [facility]    [facility] (for caregivers and family) [address]     [address] [place], [place]     [place], [place] [phone]      [phone]  Residential Treatment Programs  [facility]  [place](2 years) Residential: 90 days)  [facility] ([facility]      [address] [place], [place]     [place], [place] [phone]      [phone] or [phone]  [facility]    [facility] [address]      [address] [place], [place]     [place], [place] [phone]      [phone]  [facility]   [facility] (RTS) [address]     [address] [place], [place] [address]     [place], [place] [phone]      [phone] Admissions: 8am-3pm M-F  [facility]: Residential Program [phone] Days)              [facility]: [facility]  [place], [place]     [place], [place]  [phone] or [phone]    (Walk in Hours over the weekend or by referral)   Mobil Crisis: [facility]:[phone] (for crisis  response 24 hours a day)  Engineer, site [facility]) Monday - Friday 8am - 3pm          Sat & Sun 8am - 2pm [address] [place], [place] [address]   [phone]     www.[facility].org IRC offers among other critical resources: showers, laundry, barbershop, phone bank, mailroom, computer lab, medical clinic, gardens and a bike maintenance area.   AREA SHELTERS  [facility] [place] [facility] [employer]  (Men & women) [address] [employer] [place] [phone]  [facility] (Men/women/families) [address] [place] [phone]   [facility] (Families with children) [phone] [address]  [place] [phone]   [facility] (Domestic Violence Shelter) [address]  [place] [phone]   [facility] (Children ages 65-17) [address]  [place] [phone]   [facility]   (Women & children) [address] [place] [phone]   [facility] (Women/substance abuse) [address]   [phone]   [facility] (Men) [address]  [place] [phone]   [facility] (Men) [address] [place] [phone]  [employer] (Women) [place]  [place] [phone]   [facility] (Single women & women with children) [address]  [place] Hopping [phone]  [facility] (Men/women/families) [address] [phone]    [facility]   (Domestic Violence shelter) [place] [phone]   [facility] (Men & women) [address]  [place] ([place] (Men) [address]  [employer] [phone] [place] (Men) [address] [place] [phone]   S[facility] (Single women & families) [address] [place] [phone]  Crisis  Min. (Men/women & families) [place].  [place] [phone]    If you are at risk of losing your housing (throughout [facility]) call the [place] at [phone]. You may also contact 2-1-1, a FREE service of the [place] Way that provides information about many resources including housing. Dial 211, or visit online at CustodianSupply.fi. [place], contact Margretta Ditty [phone] (men only)                          Natale Lay in [place]:  18 day homeless program for women and men;                             contact Rev. Chambers Henderson:  men/women/children [place] in [place], [place] [phone]                       [facility] in [place], Threasa Alpha [phone]   [facility]:  [place] for Abused Women, [phone]; (women and children)  [facility]:  [place], men/women/children; [place] in [place], [place]; substance abuse halfway house for men              [facility]; 4 bedroom house in [facility] for homeless women, contact [place] in [place], [phone] (women and children)               [facility], for abused women and children, 24 hour crisis line, [phone], [place], halfway house for women, [place], [place]:  [facility], [phone]; open Mon-Thurs from SKA76 - March 15 when temp is below 32 degrees                              [facility]; [place], [phone]; cell [phone]; open 24/7  [facility]:  Outreach for [place] - [phone]  [place]:  transitional housing for women and children; Jeanie Cooks [phone]

## 2022-06-06 NOTE — ED Notes (Signed)
Pt brought back into a room. Pt is uncooperative.  States "I want to kill myself and I want to stay here".  Pt denies plan at this time. Reports visual hallucinations stating "I see all kinds of monster looking people".  Reports auditory hallucinations of people talking her and "harassing" her.  Reports she came to the hospital for numbness in her left arm.

## 2022-06-06 NOTE — ED Notes (Signed)
Breakfast tray ordered 

## 2022-06-06 NOTE — H&P (Signed)
Behavioral Health Medical Screening Exam  Kristina Owens is a 56 y.o. female.  With a history of polysubstance abuse, alcohol abuse, suicidal ideation, homelessness presented to [facility] voluntarily asking for detox.  Patient is very drowsy at the moment unable to provide history, comprehension unable to understand.  Patient seemed to be very intoxicated according to patient she had a couple bottles of liquor tonight and she was dropped off by a friend.  Patient also stated she is homeless.  Face-to-face observation of patient patient is drowsy, patient is very incoherent, unable to provide history.  Patient denies SI HI or AVH.  Reports she was drinking a couple of bottles of liquor tonight.  Will send patient over to ED for further evaluation.  Spoke with Dr Tyrone Nine who agree to take patient.    Total Time spent with patient: 20 minutes  Psychiatric Specialty Exam:  Presentation  General Appearance:  Appropriate for Environment  Eye Contact: Fair  Speech: Clear and Coherent  Speech Volume: Normal  Handedness: Right   Mood and Affect  Mood: Anxious  Affect: Congruent   Thought Process  Thought Processes: Linear  Descriptions of Associations:Intact  Orientation:Full (Time, Place and Person)  Thought Content:Logical  History of Schizophrenia/Schizoaffective disorder:No  Duration of Psychotic Symptoms:No data recorded Hallucinations:No data recorded Ideas of Reference:None  Suicidal Thoughts:No data recorded Homicidal Thoughts:No data recorded  Sensorium  Memory: Immediate Fair  Judgment: Poor  Insight: Poor   Executive Functions  Concentration: Fair  Attention Span: Fair  Recall: [place] of Knowledge: Fair  Language: Fair   Psychomotor Activity  Psychomotor Activity:No data recorded  Assets  Assets: Desire for Improvement; Communication Skills; Physical Health   Sleep  Sleep:No data recorded   Physical Exam: Physical  Exam Constitutional:      Appearance: She is toxic-appearing.  HENT:     Head: Normocephalic.     Nose: Nose normal.  Musculoskeletal:        General: Normal range of motion.     Cervical back: Normal range of motion.  Neurological:     General: No focal deficit present.  Psychiatric:        Mood and Affect: Mood normal.        Thought Content: Thought content normal.    Review of Systems  Constitutional: Negative.   HENT: Negative.    Eyes: Negative.   Respiratory: Negative.    Cardiovascular: Negative.   Gastrointestinal: Negative.   Genitourinary: Negative.   Musculoskeletal: Negative.   Skin: Negative.   Neurological: Negative.   Endo/Heme/Allergies: Negative.   Psychiatric/Behavioral:  Positive for substance abuse. The patient is nervous/anxious.    Blood pressure 94/63, pulse 89, temperature (!) 97.5 F (36.4 C), temperature source Oral, resp. rate 16, SpO2 100 %. There is no height or weight on file to calculate BMI.  Musculoskeletal: Strength & Muscle Tone: within normal limits Gait & Station: normal Patient leans: N/A  [place] Scale:  Flowsheet Row OP Visit from 06/06/2022 in [facility] ED from 06/05/2022 in [facility] DEPT ED from 05/08/2022 in [facility] No Risk No Risk High Risk       Recommendations:  Pt very intoxicated and incoherent   Evette Georges, NP 06/06/2022, 12:19 AM

## 2022-06-06 NOTE — ED Notes (Signed)
Pt refused discharge instructions, vital signs.  Pt ambulatory on departure.

## 2022-07-02 ENCOUNTER — Emergency Department (HOSPITAL_COMMUNITY): Payer: Self-pay

## 2022-07-02 ENCOUNTER — Emergency Department (HOSPITAL_COMMUNITY)
Admission: EM | Admit: 2022-07-02 | Discharge: 2022-07-02 | Disposition: A | Discharge status: Home / Self Care | Payer: Self-pay | Attending: Emergency Medicine | Admitting: Emergency Medicine

## 2022-07-02 ENCOUNTER — Encounter (HOSPITAL_COMMUNITY): Payer: Self-pay

## 2022-07-02 DIAGNOSIS — N3001 Acute cystitis with hematuria: Secondary | ICD-10-CM

## 2022-07-02 DIAGNOSIS — Z7901 Long term (current) use of anticoagulants: Secondary | ICD-10-CM | POA: Insufficient documentation

## 2022-07-02 DIAGNOSIS — K5900 Constipation, unspecified: Secondary | ICD-10-CM

## 2022-07-02 LAB — CBC
HCT: 48.5 % — ABNORMAL HIGH (ref 36.0–46.0)
Hemoglobin: 15.8 g/dL — ABNORMAL HIGH (ref 12.0–15.0)
MCH: 30.5 pg (ref 26.0–34.0)
MCHC: 32.6 g/dL (ref 30.0–36.0)
MCV: 93.6 fL (ref 80.0–100.0)
Platelets: 249 10*3/uL (ref 150–400)
RBC: 5.18 MIL/uL — ABNORMAL HIGH (ref 3.87–5.11)
RDW: 13.5 % (ref 11.5–15.5)
WBC: 9.9 10*3/uL (ref 4.0–10.5)
nRBC: 0 % (ref 0.0–0.2)

## 2022-07-02 LAB — BASIC METABOLIC PANEL
Anion gap: 7 (ref 5–15)
BUN: 9 mg/dL (ref 6–20)
CO2: 26 mmol/L (ref 22–32)
Calcium: 8.9 mg/dL (ref 8.9–10.3)
Chloride: 105 mmol/L (ref 98–111)
Creatinine, Ser: 0.64 mg/dL (ref 0.44–1.00)
GFR, Estimated: >60 mL/min (ref >60)
Glucose, Bld: 114 mg/dL — ABNORMAL HIGH (ref 70–99)
Potassium: 3.9 mmol/L (ref 3.5–5.1)
Sodium: 138 mmol/L (ref 135–145)

## 2022-07-02 LAB — URINALYSIS, ROUTINE W REFLEX MICROSCOPIC
Bilirubin Urine: NEGATIVE
Glucose, UA: NEGATIVE mg/dL
Ketones, ur: NEGATIVE mg/dL
Nitrite: POSITIVE — AB
Protein, ur: NEGATIVE mg/dL
Specific Gravity, Urine: 1.014 (ref 1.005–1.030)
WBC, UA: >50 WBC/hpf — ABNORMAL HIGH (ref 0–5)
pH: 5 (ref 5.0–8.0)

## 2022-07-02 LAB — HEPATIC FUNCTION PANEL
ALT: 16 U/L (ref 0–44)
AST: 17 U/L (ref 15–41)
Albumin: 3.8 g/dL (ref 3.5–5.0)
Alkaline Phosphatase: 91 U/L (ref 38–126)
Bilirubin, Direct: 0.1 mg/dL (ref 0.0–0.2)
Indirect Bilirubin: 0.8 mg/dL (ref 0.3–0.9)
Total Bilirubin: 0.9 mg/dL (ref 0.3–1.2)
Total Protein: 6.9 g/dL (ref 6.5–8.1)

## 2022-07-02 LAB — LIPASE, BLOOD: Lipase: 35 U/L (ref 11–51)

## 2022-07-02 MED ORDER — SODIUM CHLORIDE 0.9 % IV BOLUS
1000.0000 mL | Freq: Once | INTRAVENOUS | Status: AC
  Administered 2022-07-02: 1000 mL via INTRAVENOUS

## 2022-07-02 MED ORDER — POLYETHYLENE GLYCOL 3350 17 GM/SCOOP PO POWD: 1.0000 | Freq: Once | ORAL | 0 refills | Status: AC

## 2022-07-02 MED ORDER — ONDANSETRON HCL 4 MG/2ML IJ SOLN
4.0000 mg | Freq: Once | INTRAMUSCULAR | Status: AC
  Administered 2022-07-02: 4 mg via INTRAVENOUS
  Filled 2022-07-02: qty 2

## 2022-07-02 MED ORDER — CEPHALEXIN 500 MG PO CAPS: 500.0000 mg | ORAL_CAPSULE | Freq: Four times a day (QID) | ORAL | 0 refills | Status: DC

## 2022-07-02 MED ORDER — SODIUM CHLORIDE 0.9 % IV SOLN
1.0000 g | Freq: Once | INTRAVENOUS | Status: AC
  Administered 2022-07-02: 1 g via INTRAVENOUS
  Filled 2022-07-02: qty 10

## 2022-07-02 MED ORDER — KETOROLAC TROMETHAMINE 15 MG/ML IJ SOLN
15.0000 mg | Freq: Once | INTRAMUSCULAR | Status: AC
  Administered 2022-07-02: 15 mg via INTRAVENOUS
  Filled 2022-07-02: qty 1

## 2022-07-02 NOTE — ED Provider Notes (Signed)
[facility]-EMERGENCY DEPT Provider Note   CSN: [id number] Arrival date & time: 07/02/22  0142     History  Chief Complaint  Patient presents with   Dysuria    Kristina Owens is a 56 y.o. female.  Patient with history of polysubstance abuse, GERD, DVT, anxiety, depression who presents today with complaints of dysuria and abdominal pain.  She states that same has been ongoing for the past 3 days.  States that her pain is located in her left lower quadrant and is worse with urination.  Denies any history of similar pain previously.  Endorses history of appendectomy, no other history of abdominal surgeries. Endorses some nausea but denies vomiting or diarrhea. Is having regular bowel movements. Denies hematuria or vaginal discharge.  The history is provided by the patient. No language interpreter was used.  Dysuria Associated symptoms: abdominal pain        Home Medications Prior to Admission medications   Medication Sig Start Date End Date Taking? Authorizing Provider  ARIPiprazole (ABILIFY) 15 MG tablet Take 1 tablet (15 mg total) by mouth daily. 03/20/22 05/08/22  Massengill, Harrold Donath, MD  escitalopram (LEXAPRO) 5 MG tablet Take 3 tablets (15 mg total) by mouth daily. 03/20/22 05/08/22  Massengill, Harrold Donath, MD  gabapentin (NEURONTIN) 100 MG capsule Take 2 capsules (200 mg total) by mouth 3 (three) times daily. 03/19/22 05/08/22  Massengill, Harrold Donath, MD  ibuprofen (ADVIL) 600 MG tablet Take 1 tablet (600 mg total) by mouth every 6 (six) hours as needed. 03/20/22   Redwine, Madison A, PA-C  Multiple Vitamin (MULTIVITAMIN WITH MINERALS) TABS tablet Take 1 tablet by mouth daily. 03/20/22   Massengill, Harrold Donath, MD  rivaroxaban (XARELTO) 20 MG TABS tablet Take 1 tablet (20 mg total) by mouth daily with supper. 03/19/22 05/08/22  Massengill, Harrold Donath, MD  traZODone (DESYREL) 50 MG tablet Take 1 tablet (50 mg total) by mouth at bedtime as needed for sleep. 03/19/22 05/08/22   Phineas Inches, MD      Allergies    Patient has no known allergies.    Review of Systems   Review of Systems  Gastrointestinal:  Positive for abdominal pain.  Genitourinary:  Positive for dysuria.  All other systems reviewed and are negative.   Physical Exam Updated Vital Signs BP 118/73 (BP Location: Left Arm)   Pulse 66   Temp 98.1 F (36.7 C) (Oral)   Resp 16   SpO2 100%  Physical Exam Vitals and nursing note reviewed.  Constitutional:      General: She is not in acute distress.    Appearance: Normal appearance. She is normal weight. She is not ill-appearing, toxic-appearing or diaphoretic.  HENT:     Head: Normocephalic and atraumatic.  Cardiovascular:     Rate and Rhythm: Normal rate.  Pulmonary:     Effort: Pulmonary effort is normal. No respiratory distress.  Abdominal:     General: Abdomen is flat.     Palpations: Abdomen is soft.     Tenderness: There is abdominal tenderness.     Comments: LLQ tenderness  Musculoskeletal:        General: Normal range of motion.     Cervical back: Normal range of motion.  Skin:    General: Skin is warm and dry.  Neurological:     General: No focal deficit present.     Mental Status: She is alert.  Psychiatric:        Mood and Affect: Mood normal.  Behavior: Behavior normal.     ED Results / Procedures / Treatments   Labs (all labs ordered are listed, but only abnormal results are displayed) Labs Reviewed  URINALYSIS, ROUTINE W REFLEX MICROSCOPIC - Abnormal; Notable for the following components:      Result Value   APPearance HAZY (*)    Hgb urine dipstick SMALL (*)    Nitrite POSITIVE (*)    Leukocytes,Ua LARGE (*)    WBC, UA >50 (*)    Bacteria, UA FEW (*)    Non Squamous Epithelial 0-5 (*)    All other components within normal limits  CBC - Abnormal; Notable for the following components:   RBC 5.18 (*)    Hemoglobin 15.8 (*)    HCT 48.5 (*)    All other components within normal limits  BASIC  METABOLIC PANEL - Abnormal; Notable for the following components:   Glucose, Bld 114 (*)    All other components within normal limits  LIPASE, BLOOD  HEPATIC FUNCTION PANEL    EKG None  Radiology CT Renal Stone Study  Result Date: 07/02/2022 CLINICAL DATA:  Left lower quadrant pain, painful urination EXAM: CT ABDOMEN AND PELVIS WITHOUT CONTRAST TECHNIQUE: Multidetector CT imaging of the abdomen and pelvis was performed following the standard protocol without IV contrast. RADIATION DOSE REDUCTION: This exam was performed according to the departmental dose-optimization program which includes automated exposure control, adjustment of the mA and/or kV according to patient size and/or use of iterative reconstruction technique. COMPARISON:  03/14/2022 FINDINGS: Lower chest: No acute abnormality. Hepatobiliary: No focal liver abnormality is seen. No gallstones, gallbladder wall thickening, or biliary dilatation. Pancreas: Unremarkable. No pancreatic ductal dilatation or surrounding inflammatory changes. Spleen: Normal in size without focal abnormality. Adrenals/Urinary Tract: Adrenal glands are unremarkable. Kidneys are normal, without renal calculi, focal lesion, or hydronephrosis. Bladder is unremarkable. Stomach/Bowel: Stomach is within normal limits. No evidence of bowel wall thickening, distention, or inflammatory changes. Moderate amount of stool throughout the colon. Vascular/Lymphatic: No significant vascular findings are present. No enlarged abdominal or pelvic lymph nodes. Reproductive: Uterus and bilateral adnexa are unremarkable. Other: No abdominal wall hernia or abnormality. No abdominopelvic ascites. Musculoskeletal: No acute osseous abnormality. No aggressive osseous lesion. Bilateral facet arthropathy throughout the lumbar spine. Minimal grade 1 anterolisthesis of L4 on L5. Mild broad-based disc bulge at L4-5. IMPRESSION: 1. No acute abdominal or pelvic pathology. 2. Moderate amount of stool  throughout the colon. Electronically Signed   By: Elige Ko M.D.   On: 07/02/2022 08:36    Procedures Procedures    Medications Ordered in ED Medications  sodium chloride 0.9 % bolus 1,000 mL (1,000 mLs Intravenous New Bag/Given 07/02/22 0842)  ondansetron (ZOFRAN) injection 4 mg (4 mg Intravenous Given 07/02/22 0841)  ketorolac (TORADOL) 15 MG/ML injection 15 mg (15 mg Intravenous Given 07/02/22 0857)  cefTRIAXone (ROCEPHIN) 1 g in sodium chloride 0.9 % 100 mL IVPB (1 g Intravenous New Bag/Given 07/02/22 0946)    ED Course/ Medical Decision Making/ A&P                           Medical Decision Making Amount and/or Complexity of Data Reviewed Labs: ordered. Radiology: ordered.  Risk Prescription drug management.   This patient is a 57 y.o. female who presents to the ED for concern of abdominal pain and dysuria, this involves an extensive number of treatment options, and is a complaint that carries with it a high risk of  complications and morbidity. The emergent differential diagnosis prior to evaluation includes, but is not limited to,  The differential diagnosis for generalized abdominal pain includes, but is not limited to AAA, gastroenteritis, appendicitis, Bowel obstruction, Bowel perforation. Gastroparesis, DKA, Hernia, Inflammatory bowel disease, mesenteric ischemia, pancreatitis, peritonitis SBP, volvulus.  .   This is not an exhaustive differential.   Past Medical History / Co-morbidities / Social History: Hx polysubstance abuse  Physical Exam: Physical exam performed. The pertinent findings include: TTP LLQ abdomen  Lab Tests: I ordered, and personally interpreted labs.  The pertinent results include:  UA infectious. No leukocytosis. No other acute laboratory findings.   Imaging Studies: I ordered imaging studies including CT renal. I independently visualized and interpreted imaging which showed   1. No acute abdominal or pelvic pathology. 2. Moderate amount  of stool throughout the colon.  I agree with the radiologist interpretation.   Medications: I ordered medication including rocephin, toradol, zofran, fluids  for pain, nausea, UTI. Reevaluation of the patient after these medicines showed that the patient improved. I have reviewed the patients home medicines and have made adjustments as needed.    Disposition:  Patient is nontoxic, nonseptic appearing, in no apparent distress.  Patient's pain and other symptoms adequately managed in emergency department.  Fluid bolus given.  Labs, imaging and vitals reviewed.  Patient does not meet the SIRS or Sepsis criteria.  On repeat exam patient does not have a surgical abdomin and there are no peritoneal signs.  No indication of appendicitis, bowel obstruction, bowel perforation, cholecystitis, diverticulitis, PID or ectopic pregnancy.  Upon reassessment, patient able to eat and drink normally without any episodes of nausea.  Will send with Keflex for UTI and recommend MiraLAX cleanout for constipation seen on CT imaging.  Patient discharged home and given strict instructions for follow-up with their primary care physician.  I have also discussed reasons to return immediately to the ER.  Patient expresses understanding and agrees with plan.  Patient discharged in stable condition.    Final Clinical Impression(s) / ED Diagnoses Final diagnoses:  Acute cystitis with hematuria  Constipation, unspecified constipation type    Rx / DC Orders ED Discharge Orders          Ordered    cephALEXin (KEFLEX) 500 MG capsule  4 times daily        07/02/22 1042    polyethylene glycol powder (GLYCOLAX/MIRALAX) 17 GM/SCOOP powder   Once        07/02/22 1042          An After Visit Summary was printed and given to the patient.     Silva Bandy, PA-C 07/02/22 1044    Tanda Rockers A, DO 07/02/22 1605

## 2022-07-02 NOTE — Discharge Instructions (Addendum)
As we discussed, your workup in the ER today revealed that you have a urinary tract infection.  I have given you a prescription for an antibiotic for you to take as prescribed in its entirety for management of this.  Additionally, your CT scan did show that you are constipated.  I have given you a prescription for MiraLAX for you to take for management of this.  Follow-up with your primary care doctor for your additional health needs.  Return if development of any new or worsening symptoms.

## 2022-07-02 NOTE — ED Triage Notes (Signed)
Patient arrived with complaints of LLQ abdominal pain and painful urination over the last three days.

## 2022-07-11 ENCOUNTER — Emergency Department (HOSPITAL_COMMUNITY): Payer: Medicaid Other

## 2022-07-11 ENCOUNTER — Other Ambulatory Visit: Payer: Self-pay

## 2022-07-11 ENCOUNTER — Emergency Department (HOSPITAL_COMMUNITY)
Admission: EM | Admit: 2022-07-11 | Discharge: 2022-07-11 | Disposition: A | Discharge status: Home / Self Care | Payer: Medicaid Other | Attending: Emergency Medicine | Admitting: Emergency Medicine

## 2022-07-11 ENCOUNTER — Encounter (HOSPITAL_COMMUNITY): Payer: Self-pay

## 2022-07-11 DIAGNOSIS — T699XXA Effect of reduced temperature, unspecified, initial encounter: Secondary | ICD-10-CM | POA: Insufficient documentation

## 2022-07-11 DIAGNOSIS — X31XXXA Exposure to excessive natural cold, initial encounter: Secondary | ICD-10-CM | POA: Insufficient documentation

## 2022-07-11 DIAGNOSIS — Z5900 Homelessness unspecified: Secondary | ICD-10-CM | POA: Insufficient documentation

## 2022-07-11 DIAGNOSIS — R45851 Suicidal ideations: Secondary | ICD-10-CM | POA: Insufficient documentation

## 2022-07-11 DIAGNOSIS — Z86718 Personal history of other venous thrombosis and embolism: Secondary | ICD-10-CM | POA: Insufficient documentation

## 2022-07-11 DIAGNOSIS — R0602 Shortness of breath: Secondary | ICD-10-CM | POA: Insufficient documentation

## 2022-07-11 DIAGNOSIS — R109 Unspecified abdominal pain: Secondary | ICD-10-CM | POA: Insufficient documentation

## 2022-07-11 DIAGNOSIS — R0789 Other chest pain: Secondary | ICD-10-CM | POA: Insufficient documentation

## 2022-07-11 DIAGNOSIS — Z79899 Other long term (current) drug therapy: Secondary | ICD-10-CM | POA: Insufficient documentation

## 2022-07-11 DIAGNOSIS — R197 Diarrhea, unspecified: Secondary | ICD-10-CM | POA: Insufficient documentation

## 2022-07-11 DIAGNOSIS — Z7901 Long term (current) use of anticoagulants: Secondary | ICD-10-CM | POA: Insufficient documentation

## 2022-07-11 DIAGNOSIS — R110 Nausea: Secondary | ICD-10-CM | POA: Insufficient documentation

## 2022-07-11 DIAGNOSIS — Z1152 Encounter for screening for COVID-19: Secondary | ICD-10-CM | POA: Insufficient documentation

## 2022-07-11 LAB — CBC WITH DIFFERENTIAL/PLATELET
Abs Immature Granulocytes: 0.03 10*3/uL (ref 0.00–0.07)
Basophils Absolute: 0 10*3/uL (ref 0.0–0.1)
Basophils Relative: 0 %
Eosinophils Absolute: 0.1 10*3/uL (ref 0.0–0.5)
Eosinophils Relative: 1 %
HCT: 48.3 % — ABNORMAL HIGH (ref 36.0–46.0)
Hemoglobin: 15.9 g/dL — ABNORMAL HIGH (ref 12.0–15.0)
Immature Granulocytes: 0 %
Lymphocytes Relative: 29 %
Lymphs Abs: 2.8 10*3/uL (ref 0.7–4.0)
MCH: 30.2 pg (ref 26.0–34.0)
MCHC: 32.9 g/dL (ref 30.0–36.0)
MCV: 91.8 fL (ref 80.0–100.0)
Monocytes Absolute: 0.7 10*3/uL (ref 0.1–1.0)
Monocytes Relative: 7 %
Neutro Abs: 6.1 10*3/uL (ref 1.7–7.7)
Neutrophils Relative %: 63 %
Platelets: 282 10*3/uL (ref 150–400)
RBC: 5.26 MIL/uL — ABNORMAL HIGH (ref 3.87–5.11)
RDW: 13.5 % (ref 11.5–15.5)
WBC: 9.7 10*3/uL (ref 4.0–10.5)
nRBC: 0 % (ref 0.0–0.2)

## 2022-07-11 LAB — URINALYSIS, ROUTINE W REFLEX MICROSCOPIC
Bilirubin Urine: NEGATIVE
Glucose, UA: NEGATIVE mg/dL
Hgb urine dipstick: NEGATIVE
Ketones, ur: NEGATIVE mg/dL
Leukocytes,Ua: NEGATIVE
Nitrite: NEGATIVE
Protein, ur: NEGATIVE mg/dL
Specific Gravity, Urine: 1.024 (ref 1.005–1.030)
pH: 6 (ref 5.0–8.0)

## 2022-07-11 LAB — COMPREHENSIVE METABOLIC PANEL
ALT: 16 U/L (ref 0–44)
AST: 19 U/L (ref 15–41)
Albumin: 3.6 g/dL (ref 3.5–5.0)
Alkaline Phosphatase: 78 U/L (ref 38–126)
Anion gap: 10 (ref 5–15)
BUN: 9 mg/dL (ref 6–20)
CO2: 24 mmol/L (ref 22–32)
Calcium: 8.5 mg/dL — ABNORMAL LOW (ref 8.9–10.3)
Chloride: 105 mmol/L (ref 98–111)
Creatinine, Ser: 0.57 mg/dL (ref 0.44–1.00)
GFR, Estimated: >60 mL/min (ref >60)
Glucose, Bld: 90 mg/dL (ref 70–99)
Potassium: 3.8 mmol/L (ref 3.5–5.1)
Sodium: 139 mmol/L (ref 135–145)
Total Bilirubin: 0.4 mg/dL (ref 0.3–1.2)
Total Protein: 6.3 g/dL — ABNORMAL LOW (ref 6.5–8.1)

## 2022-07-11 LAB — I-STAT BETA HCG BLOOD, ED (MC, WL, AP ONLY): I-stat hCG, quantitative: <5 m[IU]/mL (ref <5)

## 2022-07-11 LAB — RESP PANEL BY RT-PCR (FLU A&B, COVID) ARPGX2
Influenza A by PCR: NEGATIVE
Influenza B by PCR: NEGATIVE
SARS Coronavirus 2 by RT PCR: NEGATIVE

## 2022-07-11 LAB — TROPONIN I (HIGH SENSITIVITY)
Troponin I (High Sensitivity): <2 ng/L (ref <18)
Troponin I (High Sensitivity): 2 ng/L (ref <18)

## 2022-07-11 LAB — D-DIMER, QUANTITATIVE: D-Dimer, Quant: <0.27 ug/mL-FEU (ref 0.00–0.50)

## 2022-07-11 LAB — LIPASE, BLOOD: Lipase: 35 U/L (ref 11–51)

## 2022-07-11 MED ORDER — SODIUM CHLORIDE 0.9 % IV BOLUS
1000.0000 mL | Freq: Once | INTRAVENOUS | Status: AC
  Administered 2022-07-11: 1000 mL via INTRAVENOUS

## 2022-07-11 MED ORDER — ASPIRIN 81 MG PO CHEW
324.0000 mg | CHEWABLE_TABLET | Freq: Once | ORAL | Status: AC
  Administered 2022-07-11: 324 mg via ORAL
  Filled 2022-07-11: qty 4

## 2022-07-11 MED ORDER — ONDANSETRON HCL 4 MG/2ML IJ SOLN
4.0000 mg | Freq: Once | INTRAMUSCULAR | Status: AC
  Administered 2022-07-11: 4 mg via INTRAVENOUS
  Filled 2022-07-11: qty 2

## 2022-07-11 NOTE — ED Triage Notes (Signed)
Pt arrives by [facility] from the parking lot of [employer] c/o chest tightness, shortness of breath, nausea, and her toes feeling numb from being out in the cold all night.

## 2022-07-11 NOTE — ED Provider Notes (Signed)
[facility] EMERGENCY DEPARTMENT Provider Note   CSN: [id number] Arrival date & time: 07/11/22  0636     History  Chief Complaint  Patient presents with   Chest Pain   Shortness of Breath   Nausea   Cold Exposure    Kristina Owens is a 56 y.o. female.   Chest Pain Associated symptoms: shortness of breath   Shortness of Breath Associated symptoms: chest pain   The patient is a 56 year old female with past medical history of homelessness, EtOH abuse polysubstance use, GERD, DVT, suicidal ideation presenting by EMS for evaluation of chest pain, shortness of breath, nausea, abdominal pain, and diarrhea.  The patient states that she developed chest tightness localized to the left side of her chest without radiation sometime last night.  She has associated abdominal pain and nausea without vomiting.  She states that her chest pain is aggravated with exertion and improved with rest.  She is also endorsing several days of diarrhea with 1 bowel movement per day.  She denies fever, lower extremity swelling, or rashes.  She states that she is homeless and was sleeping behind one of the local gyms when her symptoms began.  She is concerned that she may have hypothermia and reports that her toes feel numb.  She has a prior history of DVT but is not on any medications.  She also states that she has a urinary tract which was diagnosed several days ago however, she has not been taking any medications for it.     Home Medications Prior to Admission medications   Medication Sig Start Date End Date Taking? Authorizing Provider  ARIPiprazole (ABILIFY) 15 MG tablet Take 1 tablet (15 mg total) by mouth daily. Patient not taking: Reported on 07/11/2022 03/20/22 05/08/22  Phineas Inches, MD  cephALEXin (KEFLEX) 500 MG capsule Take 1 capsule (500 mg total) by mouth 4 (four) times daily. Patient not taking: Reported on 07/11/2022 07/02/22   Smoot, Shawn Route, PA-C  escitalopram (LEXAPRO)  5 MG tablet Take 3 tablets (15 mg total) by mouth daily. Patient not taking: Reported on 07/11/2022 03/20/22 05/08/22  Phineas Inches, MD  gabapentin (NEURONTIN) 100 MG capsule Take 2 capsules (200 mg total) by mouth 3 (three) times daily. Patient not taking: Reported on 07/11/2022 03/19/22 05/08/22  Phineas Inches, MD  ibuprofen (ADVIL) 600 MG tablet Take 1 tablet (600 mg total) by mouth every 6 (six) hours as needed. Patient not taking: Reported on 07/11/2022 03/20/22   Redwine, Madison A, PA-C  Multiple Vitamin (MULTIVITAMIN WITH MINERALS) TABS tablet Take 1 tablet by mouth daily. Patient not taking: Reported on 07/11/2022 03/20/22   Phineas Inches, MD  rivaroxaban (XARELTO) 20 MG TABS tablet Take 1 tablet (20 mg total) by mouth daily with supper. Patient not taking: Reported on 07/11/2022 03/19/22 05/08/22  Phineas Inches, MD  traZODone (DESYREL) 50 MG tablet Take 1 tablet (50 mg total) by mouth at bedtime as needed for sleep. Patient not taking: Reported on 07/11/2022 03/19/22 05/08/22  Phineas Inches, MD      Allergies    Patient has no known allergies.    Review of Systems   Review of Systems  Respiratory:  Positive for shortness of breath.   Cardiovascular:  Positive for chest pain.   See HPI Physical Exam Updated Vital Signs BP 103/73   Pulse 76   Temp 98 F (36.7 C) (Oral)   Resp 14   Ht 5\' 3"  (1.6 m)   Wt 49.9 kg  SpO2 95%   BMI 19.49 kg/m  Physical Exam Vitals and nursing note reviewed.  Constitutional:      General: She is not in acute distress.    Appearance: She is well-developed.  HENT:     Head: Normocephalic and atraumatic.  Eyes:     Conjunctiva/sclera: Conjunctivae normal.  Cardiovascular:     Rate and Rhythm: Normal rate and regular rhythm.     Heart sounds: No murmur heard. Pulmonary:     Effort: Pulmonary effort is normal. No respiratory distress.     Breath sounds: Normal breath sounds.  Abdominal:     Palpations: Abdomen is soft.      Tenderness: There is no abdominal tenderness.  Musculoskeletal:        General: No swelling.     Cervical back: Neck supple.  Skin:    General: Skin is warm and dry.     Capillary Refill: Capillary refill takes less than 2 seconds.  Neurological:     Mental Status: She is alert.  Psychiatric:        Mood and Affect: Mood normal.     ED Results / Procedures / Treatments   Labs (all labs ordered are listed, but only abnormal results are displayed) Labs Reviewed  CBC WITH DIFFERENTIAL/PLATELET - Abnormal; Notable for the following components:      Result Value   RBC 5.26 (*)    Hemoglobin 15.9 (*)    HCT 48.3 (*)    All other components within normal limits  COMPREHENSIVE METABOLIC PANEL - Abnormal; Notable for the following components:   Calcium 8.5 (*)    Total Protein 6.3 (*)    All other components within normal limits  URINALYSIS, ROUTINE W REFLEX MICROSCOPIC - Abnormal; Notable for the following components:   APPearance HAZY (*)    All other components within normal limits  RESP PANEL BY RT-PCR (FLU A&B, COVID) ARPGX2  LIPASE, BLOOD  D-DIMER, QUANTITATIVE  I-STAT BETA HCG BLOOD, ED ([facility], [facility], [facility] ONLY)  TROPONIN I (HIGH SENSITIVITY)  TROPONIN I (HIGH SENSITIVITY)    EKG EKG Interpretation  Date/Time:  Friday July 11 2022 06:43:16 EST Ventricular Rate:  64 PR Interval:  98 QRS Duration: 89 QT Interval:  395 QTC Calculation: 408 R Axis:   91 Text Interpretation: Sinus rhythm Short PR interval Borderline right axis deviation No significant change since last tracing Confirmed by Melene Plan [phone]) on 07/11/2022 7:19:46 AM  Radiology DG Chest Portable 1 View  Result Date: 07/11/2022 CLINICAL DATA:  Chest pain. EXAM: PORTABLE CHEST 1 VIEW COMPARISON:  Chest radiograph 03/06/2022 FINDINGS: The heart size and mediastinal contours are within normal limits. Both lungs are clear. The visualized skeletal structures are unremarkable. IMPRESSION: No active disease.  Electronically Signed   By: Annia Belt M.D.   On: 07/11/2022 07:59    Procedures Procedures    Medications Ordered in ED Medications  ondansetron [facility]) injection 4 mg (4 mg Intravenous Given 07/11/22 0723)  aspirin chewable tablet 324 mg (324 mg Oral Given 07/11/22 0723)  sodium chloride 0.9 % bolus 1,000 mL (0 mLs Intravenous Stopped 07/11/22 1232)    ED Course/ Medical Decision Making/ A&P                           Medical Decision Making  The patient is a 56 year old female with past medical history of homelessness, EtOH abuse polysubstance use, GERD, DVT, suicidal ideation presenting by EMS for evaluation of  chest pain, shortness of breath, nausea, abdominal pain, and diarrhea.  Differential diagnosis considered includes: ACS PE, CHF, pneumonia, viral URI, substance use, pancreatitis, cholecystitis, appendicitis, UTI, SBO, malingering.  On initial evaluation, the patient was hemodynamically stable and afebrile.  There were no significant findings on her physical exam.  The patient's feet and 2+ distal pulses with intact sensation, good capillary refill, and were warm and well-perfused.  I personally reviewed and interpreted all parts of the patient's diagnostic workup which included an EKG which showed sinus rhythm without ischemic changes; chest x-ray without mediastinal widening, focal consolidation, rib fracture, or other cardiopulmonary abnormality; CBC with white blood cell count of 9.7 and hemoglobin of 15.9; CMP without significant metabolic abnormality; serial troponins less than 2 and less than 2 respectively; lipase 35; D-dimer less than 0.27.  Urinalysis was also collected as patient was reporting recent UTI which today did not show any signs of infection.  While the patient's laboratory evaluation was pending, the nurse notified me that the patient was also endorsing suicidality.  I spoke to the patient and she endorsed worsening suicidal ideation with a plan to overdose on  "some drug that I find on the street."  I reviewed the patient's past medical record and she has presented with a similar complaint of suicidality with the same plan in the past.  She was also endorsing auditory and visual hallucinations stating that she sees "scary stuff" and hears voices telling her what direction to go but they do not tell her to harm herself or others.  She denies homicidal ideation.  Based on the patient's history, physical exam without abnormality; and diagnostic workup with nonischemic EKG, negative troponins, negative D-dimer, absence of signs of infection or metabolic abnormality, and stable vital signs I doubt ACS, PE, pneumonia, viral respiratory infection, or other serious condition at this time.  There is currently concern for malingering.  The patient was referred to the [facility] for her passive suicidal ideation and given a printed list of local shelters.  At time of discharge, she was provided with strict return precautions to the emergency department.  Amount and/or Complexity of Data Reviewed External Data Reviewed: labs, ECG and notes. Labs: ordered. Decision-making details documented in ED Course. Radiology: ordered and independent interpretation performed. Decision-making details documented in ED Course. ECG/medicine tests: ordered and independent interpretation performed. Decision-making details documented in ED Course.  Risk OTC drugs. Prescription drug management.   Patient's presentation is most consistent with acute complicated illness / injury requiring diagnostic workup.         Final Clinical Impression(s) / ED Diagnoses Final diagnoses:  Chest wall pain  Shortness of breath  Homelessness  Suicidal ideation    Rx / DC Orders ED Discharge Orders     None         Knox Saliva, MD 07/11/22 1300    Melene Plan, DO 07/11/22 1304

## 2022-07-11 NOTE — ED Notes (Signed)
Unable to provide UA at this time. Refused in and out cath. Pt states she just went to the bathroom prior to arrival in ED. Also reports SI with no specific plan but then elaborated that she could OD on pills. Pt has a hx of depression and PTSD. Reports she has been off meds for a while due to being homeless. Her sister lives in the area but has nothing to do with her. Alert and oriented x 4. Dr.Floyd notified of SI thoughts. Also reports chest pain, nausea and feeling tired for a while now. Cardiac monitoring in place.

## 2022-07-11 NOTE — Discharge Instructions (Addendum)
While in the emergency department you were evaluated for chest pain, shortness of breath, and concern for suicidal ideation.  Your medical workup was negative.  Please follow-up with the [facility] for further evaluation of your feelings of self-harm.  The address has been provided in your paperwork.

## 2022-07-14 ENCOUNTER — Emergency Department (HOSPITAL_COMMUNITY)
Admission: EM | Admit: 2022-07-14 | Discharge: 2022-07-14 | Disposition: A | Discharge status: Home / Self Care | Payer: Medicaid Other | Attending: Emergency Medicine | Admitting: Emergency Medicine

## 2022-07-14 ENCOUNTER — Other Ambulatory Visit: Payer: Self-pay

## 2022-07-14 ENCOUNTER — Encounter (HOSPITAL_COMMUNITY): Payer: Self-pay

## 2022-07-14 DIAGNOSIS — F10930 Alcohol use, unspecified with withdrawal, uncomplicated: Secondary | ICD-10-CM | POA: Insufficient documentation

## 2022-07-14 DIAGNOSIS — Z1152 Encounter for screening for COVID-19: Secondary | ICD-10-CM | POA: Insufficient documentation

## 2022-07-14 LAB — RESP PANEL BY RT-PCR (FLU A&B, COVID) ARPGX2
Influenza A by PCR: NEGATIVE
Influenza B by PCR: NEGATIVE
SARS Coronavirus 2 by RT PCR: NEGATIVE

## 2022-07-14 MED ORDER — ONDANSETRON HCL 4 MG PO TABS: 4.0000 mg | ORAL_TABLET | Freq: Four times a day (QID) | ORAL | 0 refills | Status: DC

## 2022-07-14 MED ORDER — LORAZEPAM 1 MG PO TABS
0.0000 mg | ORAL_TABLET | Freq: Four times a day (QID) | ORAL | Status: DC
  Administered 2022-07-14: 2 mg via ORAL
  Filled 2022-07-14: qty 2

## 2022-07-14 MED ORDER — LORAZEPAM 2 MG/ML IJ SOLN: 0.0000 mg | Freq: Two times a day (BID) | INTRAMUSCULAR | Status: DC

## 2022-07-14 MED ORDER — IBUPROFEN 800 MG PO TABS
800.0000 mg | ORAL_TABLET | Freq: Three times a day (TID) | ORAL | 0 refills | Status: DC
  Filled 2022-07-14: qty 21, 7d supply, fill #0

## 2022-07-14 MED ORDER — IBUPROFEN 800 MG PO TABS: 800.0000 mg | ORAL_TABLET | Freq: Three times a day (TID) | ORAL | 0 refills | Status: DC

## 2022-07-14 MED ORDER — ACETAMINOPHEN 500 MG PO TABS
500.0000 mg | ORAL_TABLET | Freq: Four times a day (QID) | ORAL | 0 refills | Status: DC | PRN
  Filled 2022-07-14: qty 30, 8d supply, fill #0

## 2022-07-14 MED ORDER — THIAMINE MONONITRATE 100 MG PO TABS
100.0000 mg | ORAL_TABLET | Freq: Every day | ORAL | Status: DC
  Administered 2022-07-14: 100 mg via ORAL
  Filled 2022-07-14: qty 1

## 2022-07-14 MED ORDER — ACETAMINOPHEN 500 MG PO TABS: 500.0000 mg | ORAL_TABLET | Freq: Four times a day (QID) | ORAL | 0 refills | Status: DC | PRN

## 2022-07-14 MED ORDER — THIAMINE HCL 100 MG/ML IJ SOLN: 100.0000 mg | Freq: Every day | INTRAMUSCULAR | Status: DC

## 2022-07-14 MED ORDER — LORAZEPAM 2 MG/ML IJ SOLN: 0.0000 mg | Freq: Four times a day (QID) | INTRAMUSCULAR | Status: DC

## 2022-07-14 MED ORDER — LORAZEPAM 1 MG PO TABS: 0.0000 mg | ORAL_TABLET | Freq: Two times a day (BID) | ORAL | Status: DC

## 2022-07-14 NOTE — ED Provider Notes (Signed)
[facility]-EMERGENCY DEPT Provider Note   CSN: [id number] Arrival date & time: 07/14/22  1924     History  Chief Complaint  Patient presents with   Alcohol Problem    Kristina Owens is a 56 y.o. female with a past medical history of polysubstance use presenting today with the concern for alcohol withdrawals.  Reports that she drinks "a lot" every day but yesterday she was drinking and got picked up to go to jail.  Has not had a drink since last evening.  Endorses anxiety, tremors, headache and bodyaches.  Some stomach upset as well.   No history of seizures from alcohol withdrawal sz   Alcohol Problem       Home Medications Prior to Admission medications   Medication Sig Start Date End Date Taking? Authorizing Provider  ARIPiprazole (ABILIFY) 15 MG tablet Take 1 tablet (15 mg total) by mouth daily. Patient not taking: Reported on 07/11/2022 03/20/22 05/08/22  Phineas Inches, MD  cephALEXin (KEFLEX) 500 MG capsule Take 1 capsule (500 mg total) by mouth 4 (four) times daily. Patient not taking: Reported on 07/11/2022 07/02/22   Smoot, Shawn Route, PA-C  escitalopram (LEXAPRO) 5 MG tablet Take 3 tablets (15 mg total) by mouth daily. Patient not taking: Reported on 07/11/2022 03/20/22 05/08/22  Phineas Inches, MD  gabapentin (NEURONTIN) 100 MG capsule Take 2 capsules (200 mg total) by mouth 3 (three) times daily. Patient not taking: Reported on 07/11/2022 03/19/22 05/08/22  Phineas Inches, MD  ibuprofen (ADVIL) 600 MG tablet Take 1 tablet (600 mg total) by mouth every 6 (six) hours as needed. Patient not taking: Reported on 07/11/2022 03/20/22   Tiffanni Scarfo A, PA-C  Multiple Vitamin (MULTIVITAMIN WITH MINERALS) TABS tablet Take 1 tablet by mouth daily. Patient not taking: Reported on 07/11/2022 03/20/22   Phineas Inches, MD  rivaroxaban (XARELTO) 20 MG TABS tablet Take 1 tablet (20 mg total) by mouth daily with supper. Patient not taking: Reported on  07/11/2022 03/19/22 05/08/22  Phineas Inches, MD  traZODone (DESYREL) 50 MG tablet Take 1 tablet (50 mg total) by mouth at bedtime as needed for sleep. Patient not taking: Reported on 07/11/2022 03/19/22 05/08/22  Phineas Inches, MD      Allergies    Patient has no known allergies.    Review of Systems   Review of Systems  Physical Exam Updated Vital Signs BP 113/85   Pulse 91   Temp 97.8 F (36.6 C) (Oral)   Resp 17   SpO2 97%  Physical Exam Vitals and nursing note reviewed.  Constitutional:      Appearance: Normal appearance.     Comments: Mildly tremulous  HENT:     Head: Normocephalic and atraumatic.  Eyes:     General: No scleral icterus.    Conjunctiva/sclera: Conjunctivae normal.  Cardiovascular:     Rate and Rhythm: Normal rate and regular rhythm.  Pulmonary:     Effort: Pulmonary effort is normal. No respiratory distress.  Skin:    General: Skin is warm and dry.     Findings: No rash.  Neurological:     Mental Status: She is alert and oriented to person, place, and time.  Psychiatric:        Mood and Affect: Mood normal.     ED Results / Procedures / Treatments   Labs (all labs ordered are listed, but only abnormal results are displayed) Labs Reviewed  RESP PANEL BY RT-PCR (FLU A&B, COVID) ARPGX2    EKG None  Radiology No results found.  Procedures Procedures   Medications Ordered in ED Medications  LORazepam (ATIVAN) injection 0-4 mg ( Intravenous See Alternative 07/14/22 1958)    Or  LORazepam (ATIVAN) tablet 0-4 mg (2 mg Oral Given 07/14/22 1958)  LORazepam (ATIVAN) injection 0-4 mg (has no administration in time range)    Or  LORazepam (ATIVAN) tablet 0-4 mg (has no administration in time range)  thiamine (VITAMIN B1) tablet 100 mg (100 mg Oral Given 07/14/22 1958)    Or  thiamine (VITAMIN B1) injection 100 mg ( Intravenous See Alternative 07/14/22 1958)    ED Course/ Medical Decision Making/ A&P                            Medical Decision Making Amount and/or Complexity of Data Reviewed Labs: ordered.  Risk OTC drugs. Prescription drug management.   56 year old female presenting today for alcohol withdrawals.  Last drink was yesterday evening.  She was seen in the triage area and given Ativan per CIWA precaution.  Behavioral psych team wants to see the patient but needs a medical provider note.  It has been entered at this time.  No labs were ordered as patient appears to be in early alcohol withdrawals.  No intervention has been necessary from the triage area.  Medically cleared.  10:50pm: I spoke with Manfred Arch with the psych team.  They believe that the patient is too drowsy for further evaluation but since she recently has complained of AVH she may need to inpatient requirement.  I will see her again in the morning.  11p: I again spoke with Ms. Laural Benes about the patient.  We discussed that the patient is drowsy secondary to the Ativan that she got for her alcohol withdrawals.  Psych then reported that if the drowsiness is only from her medication that she is psychiatrically cleared and can follow-up outpatient.  Patient was reassessed and she is walking, talking no acute distress.  She is eating on the stretcher in the triage room and stable for discharge.  She is also agreeable to discharge.  Will be sent home with Zofran, Tylenol and ibuprofen.  Both myself and the psych team placed multiple resources on her discharge papers for her to get the help that she needs.  Final Clinical Impression(s) / ED Diagnoses Final diagnoses:  Alcohol withdrawal syndrome without complication (HCC)    Rx / DC Orders ED Discharge Orders          Ordered    ondansetron (ZOFRAN) 4 MG tablet  Every 6 hours,   Status:  Discontinued        07/14/22 2324    ondansetron (ZOFRAN) 4 MG tablet  Every 6 hours        07/14/22 2327    ibuprofen (ADVIL) 800 MG tablet  3 times daily,   Status:  Discontinued        07/14/22  2327    acetaminophen (TYLENOL) 500 MG tablet  Every 6 hours PRN,   Status:  Discontinued        07/14/22 2327    acetaminophen (TYLENOL) 500 MG tablet  Every 6 hours PRN        07/14/22 2330    ibuprofen (ADVIL) 800 MG tablet  3 times daily        07/14/22 2330           Results and diagnoses were explained to the patient. Return precautions discussed in  full. Patient had no additional questions and expressed complete understanding.   This chart was dictated using voice recognition software.  Despite best efforts to proofread,  errors can occur which can change the documentation meaning.     Woodroe Chen 07/14/22 2339    Rolan Bucco, MD 07/14/22 2340

## 2022-07-14 NOTE — ED Notes (Signed)
Pt provided with crackers and broth
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
## 2022-07-14 NOTE — Discharge Instructions (Addendum)
You came to the emergency department today with concern for alcohol withdrawal.  You were given Ativan for your withdrawal symptoms.  I am providing a prescription for Zofran for any nausea. Tylenol and ibuprofen have been prescribed for any body aches.    It is really important that you stop drinking alcohol. I suggest that you follow-up with one of the clinics attached to these discharge papers for outpatient substance use treatment.  Unfortunately we do not provide that in the emergency department. It was a pleasure to meet you and I hope you feel better!

## 2022-07-14 NOTE — ED Triage Notes (Signed)
Pt BIB EMS with reports of alcohol withdrawal. Pt reports taking valium around 3 or 4 pm. Last Drink yesterday. Pt reports drinking "a lot" daily. Pt complains of anxiety and SHOB.

## 2022-07-14 NOTE — ED Provider Triage Note (Signed)
Emergency Medicine Provider Triage Evaluation Note  [facility] , a 56 y.o. female  was evaluated in triage.  Patient complains of alcohol withdrawals.  She reports that she usually drinks "a lot" every day.  Yesterday she went to jail and has not had a drink since prior to going to jail.  No other drugs  Never had withdrawal seizure Review of Systems  Positive: Tremors, body aches, anxiety Negative:   Physical Exam  BP 113/85 (BP Location: Left Arm)   Pulse 91   Temp 97.8 F (36.6 C) (Oral)   Resp 17   SpO2 97%  Gen:   Awake, no distress   Resp:  Normal effort  MSK:   Moves extremities without difficulty  Other:  And oriented, tremorous  Medical Decision Making  Medically screening exam initiated at 7:34 PM.  Appropriate orders placed.  Kristina Owens was informed that the remainder of the evaluation will be completed by another provider, this initial triage assessment does not replace that evaluation, and the importance of remaining in the ED until their evaluation is complete.     Kristina Benders, PA-C 07/14/22 1935

## 2022-07-15 ENCOUNTER — Ambulatory Visit (HOSPITAL_COMMUNITY)
Admission: AD | Admit: 2022-07-15 | Discharge: 2022-07-15 | Disposition: A | Discharge status: Home / Self Care | Payer: Federal, State, Local not specified - Other | Source: Home / Self Care | Attending: Psychiatry | Admitting: Psychiatry

## 2022-07-15 ENCOUNTER — Other Ambulatory Visit (HOSPITAL_COMMUNITY): Payer: Self-pay

## 2022-07-15 ENCOUNTER — Emergency Department (HOSPITAL_COMMUNITY)
Admission: EM | Admit: 2022-07-15 | Discharge: 2022-07-15 | Discharge status: Against Medical Advice | Payer: Medicaid Other | Attending: Emergency Medicine | Admitting: Emergency Medicine

## 2022-07-15 ENCOUNTER — Emergency Department (HOSPITAL_COMMUNITY)
Admission: EM | Admit: 2022-07-15 | Discharge: 2022-07-15 | Discharge status: Against Medical Advice | Payer: Medicaid Other

## 2022-07-15 DIAGNOSIS — F10239 Alcohol dependence with withdrawal, unspecified: Secondary | ICD-10-CM | POA: Insufficient documentation

## 2022-07-15 DIAGNOSIS — R519 Headache, unspecified: Secondary | ICD-10-CM | POA: Insufficient documentation

## 2022-07-15 DIAGNOSIS — Z5321 Procedure and treatment not carried out due to patient leaving prior to being seen by health care provider: Secondary | ICD-10-CM | POA: Insufficient documentation

## 2022-07-15 DIAGNOSIS — R251 Tremor, unspecified: Secondary | ICD-10-CM | POA: Insufficient documentation

## 2022-07-15 MED ORDER — LORAZEPAM 1 MG PO TABS
1.0000 mg | ORAL_TABLET | Freq: Once | ORAL | Status: AC
  Administered 2022-07-15: 1 mg via ORAL
  Filled 2022-07-15: qty 1

## 2022-07-15 NOTE — ED Triage Notes (Signed)
Pt reports alcohol withdrawal. Pt reports drinking a pint of vodka and "2 four lokos daily." Pt reports last drink was yesterday. Pt C/O headache and tremors.
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
## 2022-07-15 NOTE — ED Notes (Signed)
Pt called x 3 no answer. Pt nowhere in the lobby.

## 2022-07-17 ENCOUNTER — Other Ambulatory Visit (HOSPITAL_COMMUNITY)
Admission: EM | Admit: 2022-07-17 | Discharge: 2022-07-22 | Disposition: A | Discharge status: Rehabilitation Facility | Payer: No Payment, Other | Attending: Psychiatry | Admitting: Psychiatry

## 2022-07-17 DIAGNOSIS — F10230 Alcohol dependence with withdrawal, uncomplicated: Secondary | ICD-10-CM

## 2022-07-17 DIAGNOSIS — F1911 Other psychoactive substance abuse, in remission: Secondary | ICD-10-CM | POA: Diagnosis not present

## 2022-07-17 DIAGNOSIS — F1020 Alcohol dependence, uncomplicated: Secondary | ICD-10-CM | POA: Diagnosis present

## 2022-07-17 DIAGNOSIS — F10930 Alcohol use, unspecified with withdrawal, uncomplicated: Secondary | ICD-10-CM | POA: Diagnosis present

## 2022-07-17 DIAGNOSIS — Z1152 Encounter for screening for COVID-19: Secondary | ICD-10-CM | POA: Insufficient documentation

## 2022-07-17 DIAGNOSIS — F1094 Alcohol use, unspecified with alcohol-induced mood disorder: Secondary | ICD-10-CM | POA: Diagnosis not present

## 2022-07-17 LAB — COMPREHENSIVE METABOLIC PANEL
ALT: 19 U/L (ref 0–44)
AST: 23 U/L (ref 15–41)
Albumin: 3.6 g/dL (ref 3.5–5.0)
Alkaline Phosphatase: 82 U/L (ref 38–126)
Anion gap: 11 (ref 5–15)
BUN: 8 mg/dL (ref 6–20)
CO2: 24 mmol/L (ref 22–32)
Calcium: 8.8 mg/dL — ABNORMAL LOW (ref 8.9–10.3)
Chloride: 104 mmol/L (ref 98–111)
Creatinine, Ser: 0.69 mg/dL (ref 0.44–1.00)
GFR, Estimated: >60 mL/min (ref >60)
Glucose, Bld: 51 mg/dL — ABNORMAL LOW (ref 70–99)
Potassium: 3.7 mmol/L (ref 3.5–5.1)
Sodium: 139 mmol/L (ref 135–145)
Total Bilirubin: 0.6 mg/dL (ref 0.3–1.2)
Total Protein: 6.2 g/dL — ABNORMAL LOW (ref 6.5–8.1)

## 2022-07-17 LAB — HIV ANTIBODY (ROUTINE TESTING W REFLEX): HIV Screen 4th Generation wRfx: NONREACTIVE

## 2022-07-17 LAB — RESP PANEL BY RT-PCR (RSV, FLU A&B, COVID)  RVPGX2
Influenza A by PCR: NEGATIVE
Influenza B by PCR: NEGATIVE
Resp Syncytial Virus by PCR: NEGATIVE
SARS Coronavirus 2 by RT PCR: NEGATIVE

## 2022-07-17 LAB — CBC WITH DIFFERENTIAL/PLATELET
Abs Immature Granulocytes: 0.04 10*3/uL (ref 0.00–0.07)
Basophils Absolute: 0 10*3/uL (ref 0.0–0.1)
Basophils Relative: 0 %
Eosinophils Absolute: 0.1 10*3/uL (ref 0.0–0.5)
Eosinophils Relative: 1 %
HCT: 41.6 % (ref 36.0–46.0)
Hemoglobin: 14.2 g/dL (ref 12.0–15.0)
Immature Granulocytes: 0 %
Lymphocytes Relative: 35 %
Lymphs Abs: 3.1 10*3/uL (ref 0.7–4.0)
MCH: 30.6 pg (ref 26.0–34.0)
MCHC: 34.1 g/dL (ref 30.0–36.0)
MCV: 89.7 fL (ref 80.0–100.0)
Monocytes Absolute: 0.5 10*3/uL (ref 0.1–1.0)
Monocytes Relative: 5 %
Neutro Abs: 5.2 10*3/uL (ref 1.7–7.7)
Neutrophils Relative %: 59 %
Platelets: 306 10*3/uL (ref 150–400)
RBC: 4.64 MIL/uL (ref 3.87–5.11)
RDW: 13.7 % (ref 11.5–15.5)
WBC: 8.9 10*3/uL (ref 4.0–10.5)
nRBC: 0 % (ref 0.0–0.2)

## 2022-07-17 LAB — POCT URINE DRUG SCREEN - MANUAL ENTRY (I-SCREEN)
POC Amphetamine UR: NOT DETECTED
POC Buprenorphine (BUP): NOT DETECTED
POC Cocaine UR: NOT DETECTED
POC Marijuana UR: NOT DETECTED
POC Methadone UR: NOT DETECTED
POC Methamphetamine UR: NOT DETECTED
POC Morphine: NOT DETECTED
POC Oxazepam (BZO): POSITIVE — AB
POC Oxycodone UR: NOT DETECTED
POC Secobarbital (BAR): NOT DETECTED

## 2022-07-17 LAB — ETHANOL: Alcohol, Ethyl (B): 37 mg/dL — ABNORMAL HIGH (ref <10)

## 2022-07-17 LAB — POC SARS CORONAVIRUS 2 AG: SARSCOV2ONAVIRUS 2 AG: NEGATIVE

## 2022-07-17 LAB — POC URINE PREG, ED: Preg Test, Ur: NEGATIVE

## 2022-07-17 LAB — POCT PREGNANCY, URINE: Preg Test, Ur: NEGATIVE

## 2022-07-17 MED ORDER — LORAZEPAM 1 MG PO TABS: 1.0000 mg | ORAL_TABLET | Freq: Four times a day (QID) | ORAL | Status: AC | PRN

## 2022-07-17 MED ORDER — ALUM & MAG HYDROXIDE-SIMETH 200-200-20 MG/5ML PO SUSP: 30.0000 mL | ORAL | Status: DC | PRN

## 2022-07-17 MED ORDER — ONDANSETRON 4 MG PO TBDP
4.0000 mg | ORAL_TABLET | Freq: Four times a day (QID) | ORAL | Status: AC | PRN
  Administered 2022-07-17 – 2022-07-20 (×3): 4 mg via ORAL
  Filled 2022-07-17 (×3): qty 1

## 2022-07-17 MED ORDER — LORAZEPAM 1 MG PO TABS
1.0000 mg | ORAL_TABLET | Freq: Two times a day (BID) | ORAL | Status: AC
  Administered 2022-07-19 – 2022-07-20 (×2): 1 mg via ORAL
  Filled 2022-07-17 (×2): qty 1

## 2022-07-17 MED ORDER — LORAZEPAM 1 MG PO TABS
1.0000 mg | ORAL_TABLET | Freq: Every day | ORAL | Status: AC
  Administered 2022-07-21: 1 mg via ORAL
  Filled 2022-07-17: qty 1

## 2022-07-17 MED ORDER — ADULT MULTIVITAMIN W/MINERALS CH
1.0000 | ORAL_TABLET | Freq: Every day | ORAL | Status: DC
  Administered 2022-07-17 – 2022-07-22 (×6): 1 via ORAL
  Filled 2022-07-17 (×6): qty 1

## 2022-07-17 MED ORDER — LORAZEPAM 1 MG PO TABS
1.0000 mg | ORAL_TABLET | Freq: Three times a day (TID) | ORAL | Status: AC
  Administered 2022-07-18 – 2022-07-19 (×3): 1 mg via ORAL
  Filled 2022-07-17 (×3): qty 1

## 2022-07-17 MED ORDER — THIAMINE HCL 100 MG/ML IJ SOLN: 100.0000 mg | Freq: Once | INTRAMUSCULAR | Status: DC

## 2022-07-17 MED ORDER — LORAZEPAM 1 MG PO TABS
1.0000 mg | ORAL_TABLET | Freq: Four times a day (QID) | ORAL | Status: AC
  Administered 2022-07-17 – 2022-07-18 (×4): 1 mg via ORAL
  Filled 2022-07-17 (×4): qty 1

## 2022-07-17 MED ORDER — THIAMINE MONONITRATE 100 MG PO TABS
100.0000 mg | ORAL_TABLET | Freq: Every day | ORAL | Status: DC
  Administered 2022-07-18 – 2022-07-22 (×5): 100 mg via ORAL
  Filled 2022-07-17 (×5): qty 1

## 2022-07-17 MED ORDER — LOPERAMIDE HCL 2 MG PO CAPS
2.0000 mg | ORAL_CAPSULE | ORAL | Status: AC | PRN
  Administered 2022-07-17: 2 mg via ORAL
  Administered 2022-07-17: 4 mg via ORAL
  Filled 2022-07-17: qty 1
  Filled 2022-07-17: qty 2

## 2022-07-17 MED ORDER — HYDROXYZINE HCL 25 MG PO TABS
25.0000 mg | ORAL_TABLET | Freq: Three times a day (TID) | ORAL | Status: DC | PRN
  Administered 2022-07-17 – 2022-07-20 (×2): 25 mg via ORAL
  Filled 2022-07-17: qty 15
  Filled 2022-07-17 (×2): qty 1

## 2022-07-17 MED ORDER — MAGNESIUM HYDROXIDE 400 MG/5ML PO SUSP: 30.0000 mL | Freq: Every day | ORAL | Status: DC | PRN

## 2022-07-17 MED ORDER — ACETAMINOPHEN 325 MG PO TABS
650.0000 mg | ORAL_TABLET | Freq: Four times a day (QID) | ORAL | Status: DC | PRN
  Administered 2022-07-19 – 2022-07-21 (×4): 650 mg via ORAL
  Filled 2022-07-17 (×4): qty 2
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
## 2022-07-17 NOTE — ED Notes (Signed)
Patient admitted to [facility] from [facility] for detox from [facility].  Patient anxious but cooperative.  Shortly after arriving patient had diahrea.  She also complaining about sweating.  She is visibly anxious.  Patient assisted with admission process, shown quickly around unit and brought to her room.  Patient also given gatorade and a change of scrubs.  She is now resting in bed.

## 2022-07-17 NOTE — Progress Notes (Signed)
Patient given 4mg  imodium for diahrrea and 25mg  vistaril for anxiety.  Patients ciwa is 7 at this time.  She is not within parameters to receive any additional PRN's as she received then earlier on [facility].  Will monitor and provide support.

## 2022-07-17 NOTE — ED Notes (Signed)
Pt is in the bed sleeping. Respirations are even and unlabored. No acute distress noted. Will continue to monitor for safety. 

## 2022-07-17 NOTE — Discharge Instructions (Addendum)
[place]     Due to expected cold temperatures, the [employer] [facility]) will re-open as a [employer] from 8:00PM tonight (Monday, 07/14/2022) through Tuesday (07/15/2022) at 8:00AM. The [facility] will re-open on Tuesday at 8:00PM through Wednesday (07/16/2022) at 8:00AM.  There are no restrictions for entry. The [facility] is located at: [address] [place], [place]. If you have questions, please call Delene Loll at [phone].     [address] of [employer]  Serves males only. Hours: [facility] operates from [facility] [employer]. Address: [address], [place]  Serves single women with no dependents. Hours: [facility] operates from 5:30PM to 8:00AM, but may remain open during the day depending on the weather. Address: [address], [place]  Serves single women and single women with children. Individuals seeking shelter should call the [facility] [place] at [phone] before 3:00PM to ensure that staff is available to operate the shelter. Hours: [facility] operates from 6:30PM-9:00AM.  Address: [address], [place]  Follow-up recommendations:  Activity:  Normal, as tolerated Diet:  Per PCP recommendation  Patient is instructed prior to discharge to: Take all medications as prescribed by her mental healthcare provider. Report any adverse effects and/or reactions from the medicines to her outpatient provider promptly. Patient has been instructed & cautioned: To not engage in alcohol and or illegal drug use while on prescription medicines.  In the event of worsening symptoms, patient is instructed to call the crisis hotline at 988, 911 and or go to the nearest ED for appropriate evaluation and treatment of symptoms. To follow-up with her primary care provider for your other medical issues, concerns and or health care needs.

## 2022-07-17 NOTE — ED Provider Notes (Signed)
Facility Based Crisis Admission H&P  Date: 07/17/22 Patient Name: Kristina Owens MRN: [id number] Chief Complaint:  Chief Complaint  Patient presents with   Addiction Problem      Diagnoses:  Final diagnoses:  Alcohol-induced mood disorder (HCC)  Alcohol dependence with uncomplicated withdrawal [facility]    HPI: Kristina Owens is a 56 year old female patient with a documented past psychiatric history of polysubstance abuse, cocaine use disorder, alcohol use disorder, methamphetamine abuse, GAD, MDD, and PTSD requesting alcohol detox.  Patient seen and evaluated face-to-face by this provider, and chart reviewed. On evaluation, patient is alert and oriented x 4. Her thought process is logical and speech is clear and coherent at a the moderate tone. He her mood is depressed and affect is congruent. She has fair eye contact. She is casually dressed. She is calm and cooperative.  Patient states that her drug of choice is alcohol. She reports drinking alcohol every day, on average 3-4 Locos and vodka when she can get it. She last consumed alcohol, one bahama mama, 1-2 hours prior to her arrival. She reports drinking alcohol since age 29 years old. She currently reports alcohol withdrawal symptoms of body aches, nausea, diarrhea, hot and cold flashes and anxiety. She last received alcohol detox and residential treatment about 3 months ago at the ED and was transferred to [facility]. She denies a history of alcohol withdrawal seizures. During triage, she reported a history of DTs. After discussing with the patient what delirium tremens means and in reviewing her chart, it does not appear that she has a history of delirium tremens. She states that in the past she used methamphetamines on and off for 10 years. She reports last using methamphetamine 5 days ago. She reports a history of using crack but states that she stopped 3 months ago.  She reports worsening depression today. She describes her depressive  symptoms as feelings of sadness, hopelessness, guilt and shame. PHQ-9 on admission is 19. She reports intermittent auditory hallucinations for the past six months. She reports hearing voices when she is drinking and when she is not drinking. She describes the voices as "telling me they are with me, where to go and where not to go." She states that sometimes the voices freaks her out. There is no objective evidence that the patient is currently responding to internal or external stimuli.   She is currently homeless and states that she sleeps outside in the cold or at the [facility]. She denies outpatient psychiatry or therapy at this time. She denies taking prescribed medications at this time. She denies a past or current medical history. She reports a family psychiatric history of deceased father was an alcoholic, deceased mother had a history of depression, and sister has a history of depression.  PHQ 2-9:  Flowsheet Row ED from 07/17/2022 in [facility] ED from 05/08/2022 in [facility] Office Visit from 03/12/2021 in [facility] 1  Thoughts that you would be better off dead, or of hurting yourself in some way More than half the days Nearly every day Not at all  PHQ-9 Total Score 19 11 22        Flowsheet Row ED from 07/17/2022 in [facility] Most recent reading at 07/17/2022  2:11 PM ED from 07/15/2022 in [facility] Most recent reading at 07/15/2022  8:29 AM Admission (Pending) from OP [facility] Most recent reading at 07/15/2022 12:36  AM  C-SSRS RISK CATEGORY No Risk No Risk No Risk        Total Time spent with patient: 45 minutes  Musculoskeletal  Strength & Muscle Tone: within normal limits Gait & Station: normal Patient leans: N/A  Psychiatric Specialty Exam  Presentation General Appearance:  Appropriate  for Environment  Eye Contact: Fair  Speech: Clear and Coherent  Speech Volume: Normal  Handedness: Right   Mood and Affect  Mood: Depressed  Affect: Congruent   Thought Process  Thought Processes: Coherent  Descriptions of Associations:Intact  Orientation:Full (Time, Place and Person)  Thought Content:Logical  Diagnosis of Schizophrenia or Schizoaffective disorder in past: No   Hallucinations:Hallucinations: None  Ideas of Reference:None  Suicidal Thoughts:Suicidal Thoughts: No  Homicidal Thoughts:Homicidal Thoughts: No   Sensorium  Memory: Immediate Fair; Recent Fair; Remote Fair  Judgment: Intact  Insight: Present   Executive Functions  Concentration: Fair  Attention Span: Fair  Recall: [employer] of Knowledge: Fair  Language: Fair   Psychomotor Activity  Psychomotor Activity: Psychomotor Activity: Normal   Assets  Assets: Communication Skills; Desire for Improvement; Leisure Time; Physical Health; Resilience   Sleep  Sleep: Sleep: Fair   Nutritional Assessment (For OBS and FBC admissions only) Has the patient had a weight loss or gain of 10 pounds or more in the last 3 months?: Yes Has the patient had a decrease in food intake/or appetite?: No Does the patient have dental problems?: No Does the patient have eating habits or behaviors that may be indicators of an eating disorder including binging or inducing vomiting?: No Has the patient recently lost weight without trying?: 2.0 Has the patient been eating poorly because of a decreased appetite?: 0 Malnutrition Screening Tool Score: 2 Nutritional Assessment Referrals: Medication/Tx changes    Physical Exam HENT:     Head: Normocephalic.     Nose: Nose normal.  Eyes:     Conjunctiva/sclera: Conjunctivae normal.  Cardiovascular:     Rate and Rhythm: Normal rate.  Pulmonary:     Effort: Pulmonary effort is normal.  Musculoskeletal:        General: Normal range  of motion.     Cervical back: Normal range of motion.  Neurological:     Mental Status: She is alert and oriented to person, place, and time.    Review of Systems  Constitutional:  Positive for chills.  HENT: Negative.    Eyes: Negative.   Respiratory: Negative.    Cardiovascular: Negative.   Gastrointestinal:  Positive for diarrhea and nausea.  Genitourinary: Negative.   Musculoskeletal:  Positive for myalgias.  Neurological: Negative.   Endo/Heme/Allergies: Negative.     Blood pressure 111/73, pulse 94, temperature 97.8 F (36.6 C), temperature source Oral, resp. rate 18, height 5\' 3"  (1.6 m), weight 110 lb (49.9 kg), SpO2 100 %. Body mass index is 19.49 kg/m.  Past Psychiatric History: polysubstance abuse, cocaine use disorder, alcohol use disorder, methamphetamine abuse, GAD, MDD, and PTSD.   Is the patient at risk to self? No  Has the patient been a risk to self in the past 6 months? Yes .    Has the patient been a risk to self within the distant past? Yes   Is the patient a risk to others? No   Has the patient been a risk to others in the past 6 months? No   Has the patient been a risk to others within the distant past? No   Past Medical History:  Past Medical History:  Diagnosis Date   Anxiety    Depression    DVT (deep vein thrombosis) in pregnancy    GERD (gastroesophageal reflux disease)    Ovarian cyst     Past Surgical History:  Procedure Laterality Date   APPENDECTOMY  age 2   IR PTA VENOUS EXCEPT DIALYSIS CIRCUIT  01/16/2021   IR RADIOLOGIST EVAL & MGMT  03/19/2021   IR THROMBECT VENO MECH MOD SED  01/16/2021   IR US GUIDE VASC ACCESS LEFT  01/16/2021   IR VENO/EXT/UNI LEFT  01/16/2021   IR VENOCAVAGRAM IVC  01/16/2021   TONSILLECTOMY Bilateral age 64    Family History:  Family History  Problem Relation Age of Onset   Asthma Mother    COPD Mother    Cancer Father        thyroid cancer    Social History:  Social History   Socioeconomic History    Marital status: Divorced    Spouse name: Not on file   Number of children: Not on file   Years of education: Not on file   Highest education level: Not on file  Occupational History   Not on file  Tobacco Use   Smoking status: Every Day    Packs/day: 0.50    Years: 35.00    Total pack years: 17.50    Types: Cigarettes   Smokeless tobacco: Never  Vaping Use   Vaping Use: Never used  Substance and Sexual Activity   Alcohol use: Yes   Drug use: Yes    Types: "Crack" cocaine, Cocaine, Methamphetamines    Comment: last used 4/31/22   Sexual activity: Not on file  Other Topics Concern   Not on file  Social History Narrative   Not on file   Social Determinants of Health   Financial Resource Strain: Not on file  Food Insecurity: Not on file  Transportation Needs: Not on file  Physical Activity: Not on file  Stress: Not on file  Social Connections: Not on file  Intimate Partner Violence: Not on file    SDOH:  SDOH Screenings   Alcohol Screen: High Risk (03/10/2022)  Depression (PHQ2-9): High Risk (07/17/2022)  Tobacco Use: High Risk (07/14/2022)    Last Labs:  Admission on 07/17/2022  Component Date Value Ref Range Status   SARSCOV2ONAVIRUS 2 AG 07/17/2022 NEGATIVE  NEGATIVE Final   Comment: (NOTE) SARS-CoV-2 antigen NOT DETECTED.   Negative results are presumptive.  Negative results do not preclude SARS-CoV-2 infection and should not be used as the sole basis for treatment or other patient management decisions, including infection  control decisions, particularly in the presence of clinical signs and  symptoms consistent with COVID-19, or in those who have been in contact with the virus.  Negative results must be combined with clinical observations, patient history, and epidemiological information. The expected result is Negative.  Fact Sheet for Patients: https://www.jennings-kim.com/  Fact Sheet for Healthcare  Providers: https://alexander-rogers.biz/  This test is not yet approved or cleared by the [place] FDA and  has been authorized for detection and/or diagnosis of SARS-CoV-2 by FDA under an Emergency Use Authorization (EUA).  This EUA will remain in effect (meaning this test can be used) for the duration of  the COV                          ID-19 declaration under Section 564(b)(1) of the Act, 21 U.S.C. section 360bbb-3(b)(1), unless the authorization is terminated or revoked  sooner.    Admission on 07/14/2022, Discharged on 07/14/2022  Component Date Value Ref Range Status   SARS Coronavirus 2 by RT PCR 07/14/2022 NEGATIVE  NEGATIVE Final   Comment: (NOTE) SARS-CoV-2 target nucleic acids are NOT DETECTED.  The SARS-CoV-2 RNA is generally detectable in upper respiratory specimens during the acute phase of infection. The lowest concentration of SARS-CoV-2 viral copies this assay can detect is 138 copies/mL. A negative result does not preclude SARS-Cov-2 infection and should not be used as the sole basis for treatment or other patient management decisions. A negative result may occur with  improper specimen collection/handling, submission of specimen other than nasopharyngeal swab, presence of viral mutation(s) within the areas targeted by this assay, and inadequate number of viral copies(<138 copies/mL). A negative result must be combined with clinical observations, patient history, and epidemiological information. The expected result is Negative.  Fact Sheet for Patients:  BloggerCourse.com  Fact Sheet for Healthcare Providers:  SeriousBroker.it  This test is no                          t yet approved or cleared by the [place] FDA and  has been authorized for detection and/or diagnosis of SARS-CoV-2 by FDA under an Emergency Use Authorization (EUA). This EUA will remain  in effect (meaning this test can  be used) for the duration of the COVID-19 declaration under Section 564(b)(1) of the Act, 21 U.S.C.section 360bbb-3(b)(1), unless the authorization is terminated  or revoked sooner.       Influenza A by PCR 07/14/2022 NEGATIVE  NEGATIVE Final   Influenza B by PCR 07/14/2022 NEGATIVE  NEGATIVE Final   Comment: (NOTE) The Xpert Xpress SARS-CoV-2/FLU/RSV plus assay is intended as an aid in the diagnosis of influenza from Nasopharyngeal swab specimens and should not be used as a sole basis for treatment. Nasal washings and aspirates are unacceptable for Xpert Xpress SARS-CoV-2/FLU/RSV testing.  Fact Sheet for Patients: BloggerCourse.com  Fact Sheet for Healthcare Providers: SeriousBroker.it  This test is not yet approved or cleared by the [place] FDA and has been authorized for detection and/or diagnosis of SARS-CoV-2 by FDA under an Emergency Use Authorization (EUA). This EUA will remain in effect (meaning this test can be used) for the duration of the COVID-19 declaration under Section 564(b)(1) of the Act, 21 U.S.C. section 360bbb-3(b)(1), unless the authorization is terminated or revoked.  Performed at [facility], [address], [place], [place] [address]   Admission on 07/11/2022, Discharged on 07/11/2022  Component Date Value Ref Range Status   WBC 07/11/2022 9.7  4.0 - 10.5 K/uL Final   RBC 07/11/2022 5.26 (H)  3.87 - 5.11 MIL/uL Final   Hemoglobin 07/11/2022 15.9 (H)  12.0 - 15.0 g/dL Final   HCT 86/57/8469 48.3 (H)  36.0 - 46.0 % Final   MCV 07/11/2022 91.8  80.0 - 100.0 fL Final   MCH 07/11/2022 30.2  26.0 - 34.0 pg Final   MCHC 07/11/2022 32.9  30.0 - 36.0 g/dL Final   RDW 62/95/2841 13.5  11.5 - 15.5 % Final   Platelets 07/11/2022 282  150 - 400 K/uL Final   nRBC 07/11/2022 0.0  0.0 - 0.2 % Final   Neutrophils Relative % 07/11/2022 63  % Final   Neutro Abs 07/11/2022 6.1  1.7 - 7.7 K/uL  Final   Lymphocytes Relative 07/11/2022 29  % Final   Lymphs Abs 07/11/2022 2.8  0.7 - 4.0  K/uL Final   Monocytes Relative 07/11/2022 7  % Final   Monocytes Absolute 07/11/2022 0.7  0.1 - 1.0 K/uL Final   Eosinophils Relative 07/11/2022 1  % Final   Eosinophils Absolute 07/11/2022 0.1  0.0 - 0.5 K/uL Final   Basophils Relative 07/11/2022 0  % Final   Basophils Absolute 07/11/2022 0.0  0.0 - 0.1 K/uL Final   Immature Granulocytes 07/11/2022 0  % Final   Abs Immature Granulocytes 07/11/2022 0.03  0.00 - 0.07 K/uL Final   Performed at [facility], [address], [place], [place] [address]   Sodium 07/11/2022 139  135 - 145 mmol/L Final   Potassium 07/11/2022 3.8  3.5 - 5.1 mmol/L Final   Chloride 07/11/2022 105  98 - 111 mmol/L Final   CO2 07/11/2022 24  22 - 32 mmol/L Final   Glucose, Bld 07/11/2022 90  70 - 99 mg/dL Final   Glucose reference range applies only to samples taken after fasting for at least 8 hours.   BUN 07/11/2022 9  6 - 20 mg/dL Final   Creatinine, Ser 07/11/2022 0.57  0.44 - 1.00 mg/dL Final   Calcium 60/45/4098 8.5 (L)  8.9 - 10.3 mg/dL Final   Total Protein 11/91/4782 6.3 (L)  6.5 - 8.1 g/dL Final   Albumin 95/62/1308 3.6  3.5 - 5.0 g/dL Final   AST 65/78/4696 19  15 - 41 U/L Final   ALT 07/11/2022 16  0 - 44 U/L Final   Alkaline Phosphatase 07/11/2022 78  38 - 126 U/L Final   Total Bilirubin 07/11/2022 0.4  0.3 - 1.2 mg/dL Final   GFR, Estimated 07/11/2022 >60  >60 mL/min Final   Comment: (NOTE) Calculated using the CKD-EPI Creatinine Equation (2021)    Anion gap 07/11/2022 10  5 - 15 Final   Performed at [facility], [address], [place], [place] [address]   Troponin I (High Sensitivity) 07/11/2022 <2  <18 ng/L Final   Comment: (NOTE) Elevated high sensitivity troponin I (hsTnI) values and significant  changes across serial measurements may suggest ACS but many other  chronic and acute conditions are known to elevate hsTnI results.  Refer to  the "Links" section for chest pain algorithms and additional  guidance. Performed at [facility], [address] [place], [place] [address]    Lipase 07/11/2022 35  11 - 51 U/L Final   Performed at [facility], [address], [place], [place] [address]   D-Dimer, Quant 07/11/2022 <0.27  0.00 - 0.50 ug/mL-FEU Final   Comment: (NOTE) At the manufacturer cut-off value of 0.5 g/mL FEU, this assay has a negative predictive value of 95-100%.This assay is intended for use in conjunction with a clinical pretest probability (PTP) assessment model to exclude pulmonary embolism (PE) and deep venous thrombosis (DVT) in outpatients suspected of PE or DVT. Results should be correlated with clinical presentation. Performed at [facility], [address], [place], [place] [address]    Color, Urine 07/11/2022 YELLOW  YELLOW Final   APPearance 07/11/2022 HAZY (A)  CLEAR Final   Specific Gravity, Urine 07/11/2022 1.024  1.005 - 1.030 Final   pH 07/11/2022 6.0  5.0 - 8.0 Final   Glucose, UA 07/11/2022 NEGATIVE  NEGATIVE mg/dL Final   Hgb urine dipstick 07/11/2022 NEGATIVE  NEGATIVE Final   Bilirubin Urine 07/11/2022 NEGATIVE  NEGATIVE Final   Ketones, ur 07/11/2022 NEGATIVE  NEGATIVE mg/dL Final   Protein, ur  07/11/2022 NEGATIVE  NEGATIVE mg/dL Final   Nitrite 00/92/3300 NEGATIVE  NEGATIVE Final   Leukocytes,Ua 07/11/2022 NEGATIVE  NEGATIVE Final   Performed at [facility], [address], [place], [place] [address]   SARS Coronavirus 2 by RT PCR 07/11/2022 NEGATIVE  NEGATIVE Final   Comment: (NOTE) SARS-CoV-2 target nucleic acids are NOT DETECTED.  The SARS-CoV-2 RNA is generally detectable in upper respiratory specimens during the acute phase of infection. The lowest concentration of SARS-CoV-2 viral copies this assay can detect is 138 copies/mL. A negative result does not preclude SARS-Cov-2 infection and should not be used as the sole basis for treatment or other  patient management decisions. A negative result may occur with  improper specimen collection/handling, submission of specimen other than nasopharyngeal swab, presence of viral mutation(s) within the areas targeted by this assay, and inadequate number of viral copies(<138 copies/mL). A negative result must be combined with clinical observations, patient history, and epidemiological information. The expected result is Negative.  Fact Sheet for Patients:  BloggerCourse.com  Fact Sheet for Healthcare Providers:  SeriousBroker.it  This test is no                          t yet approved or cleared by the [place] FDA and  has been authorized for detection and/or diagnosis of SARS-CoV-2 by FDA under an Emergency Use Authorization (EUA). This EUA will remain  in effect (meaning this test can be used) for the duration of the COVID-19 declaration under Section 564(b)(1) of the Act, 21 U.S.C.section 360bbb-3(b)(1), unless the authorization is terminated  or revoked sooner.       Influenza A by PCR 07/11/2022 NEGATIVE  NEGATIVE Final   Influenza B by PCR 07/11/2022 NEGATIVE  NEGATIVE Final   Comment: (NOTE) The Xpert Xpress SARS-CoV-2/FLU/RSV plus assay is intended as an aid in the diagnosis of influenza from Nasopharyngeal swab specimens and should not be used as a sole basis for treatment. Nasal washings and aspirates are unacceptable for Xpert Xpress SARS-CoV-2/FLU/RSV testing.  Fact Sheet for Patients: BloggerCourse.com  Fact Sheet for Healthcare Providers: SeriousBroker.it  This test is not yet approved or cleared by the [place] FDA and has been authorized for detection and/or diagnosis of SARS-CoV-2 by FDA under an Emergency Use Authorization (EUA). This EUA will remain in effect (meaning this test can be used) for the duration of the COVID-19 declaration under Section  564(b)(1) of the Act, 21 U.S.C. section 360bbb-3(b)(1), unless the authorization is terminated or revoked.  Performed at [facility], [address], [place], [place] [address]    I-stat hCG, quantitative 07/11/2022 <5.0  <5 mIU/mL Final   Comment 3 07/11/2022          Final   Comment:   GEST. AGE      CONC.  (mIU/mL)   <=1 WEEK        5 - 50     2 WEEKS       50 - 500     3 WEEKS       100 - 10,000     4 WEEKS     1,000 - 30,000        FEMALE AND NON-PREGNANT FEMALE:     LESS THAN 5 mIU/mL    Troponin I (High Sensitivity) 07/11/2022 2  <18 ng/L Final   Comment: (NOTE) Elevated high sensitivity troponin I (hsTnI) values and significant  changes across serial measurements  may suggest ACS but many other  chronic and acute conditions are known to elevate hsTnI results.  Refer to the "Links" section for chest pain algorithms and additional  guidance. Performed at [facility], [address] [place], [place] [address]   Admission on 07/02/2022, Discharged on 07/02/2022  Component Date Value Ref Range Status   Color, Urine 07/02/2022 YELLOW  YELLOW Final   APPearance 07/02/2022 HAZY (A)  CLEAR Final   Specific Gravity, Urine 07/02/2022 1.014  1.005 - 1.030 Final   pH 07/02/2022 5.0  5.0 - 8.0 Final   Glucose, UA 07/02/2022 NEGATIVE  NEGATIVE mg/dL Final   Hgb urine dipstick 07/02/2022 SMALL (A)  NEGATIVE Final   Bilirubin Urine 07/02/2022 NEGATIVE  NEGATIVE Final   Ketones, ur 07/02/2022 NEGATIVE  NEGATIVE mg/dL Final   Protein, ur 13/08/657811/29/2023 NEGATIVE  NEGATIVE mg/dL Final   Nitrite 46/96/295211/29/2023 POSITIVE (A)  NEGATIVE Final   Leukocytes,Ua 07/02/2022 LARGE (A)  NEGATIVE Final   RBC / HPF 07/02/2022 6-10  0 - 5 RBC/hpf Final   WBC, UA 07/02/2022 >50 (H)  0 - 5 WBC/hpf Final   Bacteria, UA 07/02/2022 FEW (A)  NONE SEEN Final   Squamous Epithelial / LPF 07/02/2022 6-10  0 - 5 Final   Mucus 07/02/2022 PRESENT   Final   Non Squamous Epithelial 07/02/2022 0-5 (A)  NONE  SEEN Final   Performed at [facility], [address] [place], [place] [address]   WBC 07/02/2022 9.9  4.0 - 10.5 K/uL Final   RBC 07/02/2022 5.18 (H)  3.87 - 5.11 MIL/uL Final   Hemoglobin 07/02/2022 15.8 (H)  12.0 - 15.0 g/dL Final   HCT 44/01/027211/29/2023 48.5 (H)  36.0 - 46.0 % Final   MCV 07/02/2022 93.6  80.0 - 100.0 fL Final   MCH 07/02/2022 30.5  26.0 - 34.0 pg Final   MCHC 07/02/2022 32.6  30.0 - 36.0 g/dL Final   RDW 53/66/440311/29/2023 13.5  11.5 - 15.5 % Final   Platelets 07/02/2022 249  150 - 400 K/uL Final   nRBC 07/02/2022 0.0  0.0 - 0.2 % Final   Performed at [facility], [address] [place], [place] [address]   Sodium 07/02/2022 138  135 - 145 mmol/L Final   Potassium 07/02/2022 3.9  3.5 - 5.1 mmol/L Final   Chloride 07/02/2022 105  98 - 111 mmol/L Final   CO2 07/02/2022 26  22 - 32 mmol/L Final   Glucose, Bld 07/02/2022 114 (H)  70 - 99 mg/dL Final   Glucose reference range applies only to samples taken after fasting for at least 8 hours.   BUN 07/02/2022 9  6 - 20 mg/dL Final   Creatinine, Ser 07/02/2022 0.64  0.44 - 1.00 mg/dL Final   Calcium 95/63/875611/29/2023 8.9  8.9 - 10.3 mg/dL Final   GFR, Estimated 07/02/2022 >60  >60 mL/min Final   Comment: (NOTE) Calculated using the CKD-EPI Creatinine Equation (2021)    Anion gap 07/02/2022 7  5 - 15 Final   Performed at [facility], [address] [place], [place] [address]   Lipase 07/02/2022 35  11 - 51 U/L Final   Performed at [facility], [address] [place], [place] [address]   Total Protein 07/02/2022 6.9  6.5 - 8.1 g/dL Final   Albumin 16/60/630111/29/2023 3.8  3.5 - 5.0 g/dL Final   AST 60/10/932311/29/2023 17  15 - 41 U/L Final   ALT  07/02/2022 16  0 - 44 U/L Final   Alkaline Phosphatase 07/02/2022 91  38 - 126 U/L Final   Total Bilirubin 07/02/2022 0.9  0.3 - 1.2 mg/dL Final   Bilirubin, Direct 07/02/2022 0.1  0.0 - 0.2 mg/dL Final   Indirect Bilirubin  07/02/2022 0.8  0.3 - 0.9 mg/dL Final   Performed at [facility], [address], [place], [place] [address]  Admission on 06/06/2022, Discharged on 06/06/2022  Component Date Value Ref Range Status   Sodium 06/06/2022 140  135 - 145 mmol/L Final   Potassium 06/06/2022 3.2 (L)  3.5 - 5.1 mmol/L Final   Chloride 06/06/2022 103  98 - 111 mmol/L Final   CO2 06/06/2022 26  22 - 32 mmol/L Final   Glucose, Bld 06/06/2022 97  70 - 99 mg/dL Final   Glucose reference range applies only to samples taken after fasting for at least 8 hours.   BUN 06/06/2022 6  6 - 20 mg/dL Final   Creatinine, Ser 06/06/2022 0.54  0.44 - 1.00 mg/dL Final   Calcium 60/45/4098 9.0  8.9 - 10.3 mg/dL Final   Total Protein 11/91/4782 6.5  6.5 - 8.1 g/dL Final   Albumin 95/62/1308 3.6  3.5 - 5.0 g/dL Final   AST 65/78/4696 20  15 - 41 U/L Final   ALT 06/06/2022 17  0 - 44 U/L Final   Alkaline Phosphatase 06/06/2022 111  38 - 126 U/L Final   Total Bilirubin 06/06/2022 0.3  0.3 - 1.2 mg/dL Final   GFR, Estimated 06/06/2022 >60  >60 mL/min Final   Comment: (NOTE) Calculated using the CKD-EPI Creatinine Equation (2021)    Anion gap 06/06/2022 11  5 - 15 Final   Performed at [facility], [address] [place], [place] [address]   Alcohol, Ethyl (B) 06/06/2022 18 (H)  <10 mg/dL Final   Comment: (NOTE) Lowest detectable limit for serum alcohol is 10 mg/dL.  For medical purposes only. Performed at [facility], [address] [place], [place] [address]    WBC 06/06/2022 6.0  4.0 - 10.5 K/uL Final   RBC 06/06/2022 4.76  3.87 - 5.11 MIL/uL Final   Hemoglobin 06/06/2022 14.5  12.0 - 15.0 g/dL Final   HCT 40/05/2724 43.9  36.0 - 46.0 % Final   MCV 06/06/2022 92.2  80.0 - 100.0 fL Final   MCH 06/06/2022 30.5  26.0 - 34.0 pg Final   MCHC 06/06/2022 33.0  30.0 - 36.0 g/dL Final   RDW 36/64/4034 13.5  11.5 - 15.5 % Final   Platelets 06/06/2022 224  150 - 400 K/uL Final   nRBC 06/06/2022 0.0   0.0 - 0.2 % Final   Performed at [facility], [address] [place], [place] [address]   I-stat hCG, quantitative 06/06/2022 <5.0  <5 mIU/mL Final   Comment 3 06/06/2022          Final   Comment:   GEST. AGE      CONC.  (mIU/mL)   <=1 WEEK        5 - 50     2 WEEKS       50 - 500     3 WEEKS       100 - 10,000     4 WEEKS     1,000 - 30,000        FEMALE AND NON-PREGNANT FEMALE:     LESS THAN 5 mIU/mL  Admission on 06/05/2022, Discharged on 06/05/2022  Component Date Value Ref Range Status   Sodium 06/05/2022 139  135 - 145 mmol/L Final   Potassium 06/05/2022 3.7  3.5 - 5.1 mmol/L Final   Chloride 06/05/2022 105  98 - 111 mmol/L Final   CO2 06/05/2022 26  22 - 32 mmol/L Final   Glucose, Bld 06/05/2022 114 (H)  70 - 99 mg/dL Final   Glucose reference range applies only to samples taken after fasting for at least 8 hours.   BUN 06/05/2022 8  6 - 20 mg/dL Final   Creatinine, Ser 06/05/2022 0.50  0.44 - 1.00 mg/dL Final   Calcium 96/29/5284 8.8 (L)  8.9 - 10.3 mg/dL Final   Total Protein 13/24/4010 7.1  6.5 - 8.1 g/dL Final   Albumin 27/25/3664 3.9  3.5 - 5.0 g/dL Final   AST 40/34/7425 20  15 - 41 U/L Final   ALT 06/05/2022 17  0 - 44 U/L Final   Alkaline Phosphatase 06/05/2022 120  38 - 126 U/L Final   Total Bilirubin 06/05/2022 0.5  0.3 - 1.2 mg/dL Final   GFR, Estimated 06/05/2022 >60  >60 mL/min Final   Comment: (NOTE) Calculated using the CKD-EPI Creatinine Equation (2021)    Anion gap 06/05/2022 8  5 - 15 Final   Performed at [facility], [address] [place], [place] [address]   WBC 06/05/2022 4.9  4.0 - 10.5 K/uL Final   RBC 06/05/2022 5.00  3.87 - 5.11 MIL/uL Final   Hemoglobin 06/05/2022 15.1 (H)  12.0 - 15.0 g/dL Final   HCT 75/64/3329 46.0  36.0 - 46.0 % Final   MCV 06/05/2022 92.0  80.0 - 100.0 fL Final   MCH 06/05/2022 30.2  26.0 - 34.0 pg Final   MCHC 06/05/2022 32.8  30.0 - 36.0 g/dL Final   RDW 51/88/4166 13.6  11.5 - 15.5 %  Final   Platelets 06/05/2022 248  150 - 400 K/uL Final   nRBC 06/05/2022 0.0  0.0 - 0.2 % Final   Performed at [facility], [address] [place], [place] [address]   Opiates 06/05/2022 NONE DETECTED  NONE DETECTED Final   Cocaine 06/05/2022 NONE DETECTED  NONE DETECTED Final   Benzodiazepines 06/05/2022 NONE DETECTED  NONE DETECTED Final   Amphetamines 06/05/2022 POSITIVE (A)  NONE DETECTED Final   Tetrahydrocannabinol 06/05/2022 NONE DETECTED  NONE DETECTED Final   Barbiturates 06/05/2022 NONE DETECTED  NONE DETECTED Final   Comment: (NOTE) DRUG SCREEN FOR MEDICAL PURPOSES ONLY.  IF CONFIRMATION IS NEEDED FOR ANY PURPOSE, NOTIFY LAB WITHIN 5 DAYS.  LOWEST DETECTABLE LIMITS FOR URINE DRUG SCREEN Drug Class                     Cutoff (ng/mL) Amphetamine and metabolites    1000 Barbiturate and metabolites    200 Benzodiazepine                 200 Opiates and metabolites        300 Cocaine and metabolites        300 THC                            50 Performed at [facility], [address] [place], [place] [address]    Alcohol, Ethyl (B) 06/05/2022 <10  <10 mg/dL Final   Comment: (NOTE) Lowest detectable limit for serum alcohol is  10 mg/dL.  For medical purposes only. Performed at [facility], [address], [place], [place] [address]   Admission on 05/10/2022, Discharged on 05/10/2022  Component Date Value Ref Range Status   Opiates 05/10/2022 NONE DETECTED  NONE DETECTED Final   Cocaine 05/10/2022 POSITIVE (A)  NONE DETECTED Final   Benzodiazepines 05/10/2022 NONE DETECTED  NONE DETECTED Final   Amphetamines 05/10/2022 NONE DETECTED  NONE DETECTED Final   Tetrahydrocannabinol 05/10/2022 NONE DETECTED  NONE DETECTED Final   Barbiturates 05/10/2022 NONE DETECTED  NONE DETECTED Final   Comment: (NOTE) DRUG SCREEN FOR MEDICAL PURPOSES ONLY.  IF CONFIRMATION IS NEEDED FOR ANY PURPOSE, NOTIFY LAB WITHIN 5  DAYS.  LOWEST DETECTABLE LIMITS FOR URINE DRUG SCREEN Drug Class                     Cutoff (ng/mL) Amphetamine and metabolites    1000 Barbiturate and metabolites    200 Benzodiazepine                 200 Tricyclics and metabolites     300 Opiates and metabolites        300 Cocaine and metabolites        300 THC                            50 Performed at [facility], [address], [place], [place] [address]    Alcohol, Ethyl (B) 05/10/2022 <10  <10 mg/dL Final   Comment: (NOTE) Lowest detectable limit for serum alcohol is 10 mg/dL.  For medical purposes only. Performed at [facility], [address], [place], [place] [address]    Sodium 05/10/2022 137  135 - 145 mmol/L Final   Potassium 05/10/2022 4.2  3.5 - 5.1 mmol/L Final   Chloride 05/10/2022 102  98 - 111 mmol/L Final   CO2 05/10/2022 29  22 - 32 mmol/L Final   Glucose, Bld 05/10/2022 90  70 - 99 mg/dL Final   Glucose reference range applies only to samples taken after fasting for at least 8 hours.   BUN 05/10/2022 11  6 - 20 mg/dL Final   Creatinine, Ser 05/10/2022 0.57  0.44 - 1.00 mg/dL Final   Calcium 21/30/8657 8.7 (L)  8.9 - 10.3 mg/dL Final   Total Protein 84/69/6295 7.2  6.5 - 8.1 g/dL Final   Albumin 28/41/3244 3.7  3.5 - 5.0 g/dL Final   AST 08/06/7251 18  15 - 41 U/L Final   ALT 05/10/2022 16  0 - 44 U/L Final   Alkaline Phosphatase 05/10/2022 97  38 - 126 U/L Final   Total Bilirubin 05/10/2022 0.5  0.3 - 1.2 mg/dL Final   GFR, Estimated 05/10/2022 >60  >60 mL/min Final   Comment: (NOTE) Calculated using the CKD-EPI Creatinine Equation (2021)    Anion gap 05/10/2022 6  5 - 15 Final   Performed at [facility], [address], [place], [place] [address]   WBC 05/10/2022 6.4  4.0 - 10.5 K/uL Final   RBC 05/10/2022 5.56 (H)  3.87 - 5.11 MIL/uL Final   Hemoglobin 05/10/2022 16.3 (H)  12.0 - 15.0 g/dL Final   HCT 34/74/2595 50.7 (H)  36.0 - 46.0 %  Final   MCV 05/10/2022 91.2  80.0 - 100.0 fL Final   MCH 05/10/2022 29.3  26.0 - 34.0 pg Final   MCHC 05/10/2022 32.1  30.0 - 36.0 g/dL Final   RDW 40/98/1191 13.2  11.5 - 15.5 % Final   Platelets 05/10/2022 250  150 - 400 K/uL Final   nRBC 05/10/2022 0.0  0.0 - 0.2 % Final   Neutrophils Relative % 05/10/2022 49  % Final   Neutro Abs 05/10/2022 3.2  1.7 - 7.7 K/uL Final   Lymphocytes Relative 05/10/2022 40  % Final   Lymphs Abs 05/10/2022 2.6  0.7 - 4.0 K/uL Final   Monocytes Relative 05/10/2022 8  % Final   Monocytes Absolute 05/10/2022 0.5  0.1 - 1.0 K/uL Final   Eosinophils Relative 05/10/2022 1  % Final   Eosinophils Absolute 05/10/2022 0.0  0.0 - 0.5 K/uL Final   Basophils Relative 05/10/2022 1  % Final   Basophils Absolute 05/10/2022 0.0  0.0 - 0.1 K/uL Final   Immature Granulocytes 05/10/2022 1  % Final   Abs Immature Granulocytes 05/10/2022 0.04  0.00 - 0.07 K/uL Final   Performed at [facility], [address], [place], [place] [address]   Lipase 05/10/2022 34  11 - 51 U/L Final   Performed at [facility], [address] [place], [place] [address]  Admission on 05/08/2022, Discharged on 05/09/2022  Component Date Value Ref Range Status   SARS Coronavirus 2 by RT PCR 05/08/2022 NEGATIVE  NEGATIVE Final   Comment: (NOTE) SARS-CoV-2 target nucleic acids are NOT DETECTED.  The SARS-CoV-2 RNA is generally detectable in upper respiratory specimens during the acute phase of infection. The lowest concentration of SARS-CoV-2 viral copies this assay can detect is 138 copies/mL. A negative result does not preclude SARS-Cov-2 infection and should not be used as the sole basis for treatment or other patient management decisions. A negative result may occur with  improper specimen collection/handling, submission of specimen other than nasopharyngeal swab, presence of viral mutation(s) within the areas targeted by this assay, and inadequate  number of viral copies(<138 copies/mL). A negative result must be combined with clinical observations, patient history, and epidemiological information. The expected result is Negative.  Fact Sheet for Patients:  BloggerCourse.com  Fact Sheet for Healthcare Providers:  SeriousBroker.it  This test is no                          t yet approved or cleared by the [place] FDA and  has been authorized for detection and/or diagnosis of SARS-CoV-2 by FDA under an Emergency Use Authorization (EUA). This EUA will remain  in effect (meaning this test can be used) for the duration of the COVID-19 declaration under Section 564(b)(1) of the Act, 21 U.S.C.section 360bbb-3(b)(1), unless the authorization is terminated  or revoked sooner.       Influenza A by PCR 05/08/2022 NEGATIVE  NEGATIVE Final   Influenza B by PCR 05/08/2022 NEGATIVE  NEGATIVE Final   Comment: (NOTE) The Xpert Xpress SARS-CoV-2/FLU/RSV plus assay is intended as an aid in the diagnosis of influenza from Nasopharyngeal swab specimens and should not be used as a sole basis for treatment. Nasal washings and aspirates are unacceptable for Xpert Xpress SARS-CoV-2/FLU/RSV testing.  Fact Sheet for Patients: BloggerCourse.com  Fact Sheet for Healthcare Providers: SeriousBroker.it  This test is not yet approved or cleared by the [place] FDA and has been authorized for detection and/or diagnosis of SARS-CoV-2 by FDA under an Emergency Use Authorization (EUA). This EUA will remain in effect (meaning this test can be used) for the duration of  the COVID-19 declaration under Section 564(b)(1) of the Act, 21 U.S.C. section 360bbb-3(b)(1), unless the authorization is terminated or revoked.  Performed at [facility], [address], [place], [place] [address]    WBC 05/08/2022 6.5  4.0 - 10.5 K/uL Final   RBC  05/08/2022 5.36 (H)  3.87 - 5.11 MIL/uL Final   Hemoglobin 05/08/2022 16.1 (H)  12.0 - 15.0 g/dL Final   HCT 19/14/7829 47.9 (H)  36.0 - 46.0 % Final   MCV 05/08/2022 89.4  80.0 - 100.0 fL Final   MCH 05/08/2022 30.0  26.0 - 34.0 pg Final   MCHC 05/08/2022 33.6  30.0 - 36.0 g/dL Final   RDW 56/21/3086 13.2  11.5 - 15.5 % Final   Platelets 05/08/2022 237  150 - 400 K/uL Final   nRBC 05/08/2022 0.0  0.0 - 0.2 % Final   Neutrophils Relative % 05/08/2022 37  % Final   Neutro Abs 05/08/2022 2.4  1.7 - 7.7 K/uL Final   Lymphocytes Relative 05/08/2022 52  % Final   Lymphs Abs 05/08/2022 3.4  0.7 - 4.0 K/uL Final   Monocytes Relative 05/08/2022 8  % Final   Monocytes Absolute 05/08/2022 0.5  0.1 - 1.0 K/uL Final   Eosinophils Relative 05/08/2022 1  % Final   Eosinophils Absolute 05/08/2022 0.1  0.0 - 0.5 K/uL Final   Basophils Relative 05/08/2022 1  % Final   Basophils Absolute 05/08/2022 0.0  0.0 - 0.1 K/uL Final   Immature Granulocytes 05/08/2022 1  % Final   Abs Immature Granulocytes 05/08/2022 0.05  0.00 - 0.07 K/uL Final   Performed at [facility], [address], [place], [place] [address]   Sodium 05/08/2022 135  135 - 145 mmol/L Final   Potassium 05/08/2022 3.9  3.5 - 5.1 mmol/L Final   Chloride 05/08/2022 100  98 - 111 mmol/L Final   CO2 05/08/2022 27  22 - 32 mmol/L Final   Glucose, Bld 05/08/2022 93  70 - 99 mg/dL Final   Glucose reference range applies only to samples taken after fasting for at least 8 hours.   BUN 05/08/2022 8  6 - 20 mg/dL Final   Creatinine, Ser 05/08/2022 0.68  0.44 - 1.00 mg/dL Final   Calcium 96/29/5284 9.0  8.9 - 10.3 mg/dL Final   Total Protein 13/24/4010 6.6  6.5 - 8.1 g/dL Final   Albumin 27/25/3664 3.7  3.5 - 5.0 g/dL Final   AST 40/34/7425 17  15 - 41 U/L Final   ALT 05/08/2022 17  0 - 44 U/L Final   Alkaline Phosphatase 05/08/2022 111  38 - 126 U/L Final   Total Bilirubin 05/08/2022 0.6  0.3 - 1.2 mg/dL Final   GFR, Estimated 05/08/2022  >60  >60 mL/min Final   Comment: (NOTE) Calculated using the CKD-EPI Creatinine Equation (2021)    Anion gap 05/08/2022 8  5 - 15 Final   Performed at [facility], [address], [place], [place] [address]   Hgb A1c MFr Bld 05/08/2022 4.9  4.8 - 5.6 % Final   Comment: (NOTE) Pre diabetes:          5.7%-6.4%  Diabetes:              >6.4%  Glycemic control for   <7.0% adults with diabetes    Mean Plasma Glucose 05/08/2022 93.93  mg/dL Final   Performed at [facility], [address] [place], [place] [address]  Preg Test, Ur 05/08/2022 NEGATIVE  NEGATIVE Final   Performed at [facility], [address], [place], [place] [address]   POC Amphetamine UR 05/08/2022 None Detected  NONE DETECTED (Cut Off Level 1000 ng/mL) Final   POC Secobarbital (BAR) 05/08/2022 None Detected  NONE DETECTED (Cut Off Level 300 ng/mL) Final   POC Buprenorphine (BUP) 05/08/2022 None Detected  NONE DETECTED (Cut Off Level 10 ng/mL) Final   POC Oxazepam (BZO) 05/08/2022 None Detected  NONE DETECTED (Cut Off Level 300 ng/mL) Final   POC Cocaine UR 05/08/2022 None Detected  NONE DETECTED (Cut Off Level 300 ng/mL) Final   POC Methamphetamine UR 05/08/2022 Positive (A)  NONE DETECTED (Cut Off Level 1000 ng/mL) Final   POC Morphine 05/08/2022 None Detected  NONE DETECTED (Cut Off Level 300 ng/mL) Final   POC Methadone UR 05/08/2022 None Detected  NONE DETECTED (Cut Off Level 300 ng/mL) Final   POC Oxycodone UR 05/08/2022 None Detected  NONE DETECTED (Cut Off Level 100 ng/mL) Final   POC Marijuana UR 05/08/2022 None Detected  NONE DETECTED (Cut Off Level 50 ng/mL) Final   SARSCOV2ONAVIRUS 2 AG 05/08/2022 NEGATIVE  NEGATIVE Final   Comment: (NOTE) SARS-CoV-2 antigen NOT DETECTED.   Negative results are presumptive.  Negative results do not preclude SARS-CoV-2 infection and should not be used as the sole basis for treatment or other patient management decisions, including infection  control  decisions, particularly in the presence of clinical signs and  symptoms consistent with COVID-19, or in those who have been in contact with the virus.  Negative results must be combined with clinical observations, patient history, and epidemiological information. The expected result is Negative.  Fact Sheet for Patients: https://www.jennings-kim.com/  Fact Sheet for Healthcare Providers: https://alexander-rogers.biz/  This test is not yet approved or cleared by the [place] FDA and  has been authorized for detection and/or diagnosis of SARS-CoV-2 by FDA under an Emergency Use Authorization (EUA).  This EUA will remain in effect (meaning this test can be used) for the duration of  the COV                          ID-19 declaration under Section 564(b)(1) of the Act, 21 U.S.C. section 360bbb-3(b)(1), unless the authorization is terminated or revoked sooner.     Preg Test, Ur 05/08/2022 NEGATIVE  NEGATIVE Final   Comment:        THE SENSITIVITY OF THIS METHODOLOGY IS >24 mIU/mL    Cholesterol 05/08/2022 187  0 - 200 mg/dL Final   Triglycerides 19/14/7829 117  <150 mg/dL Final   HDL 56/21/3086 67  >40 mg/dL Final   Total CHOL/HDL Ratio 05/08/2022 2.8  RATIO Final   VLDL 05/08/2022 23  0 - 40 mg/dL Final   LDL Cholesterol 05/08/2022 97  0 - 99 mg/dL Final   Comment:        Total Cholesterol/HDL:CHD Risk Coronary Heart Disease Risk Table                     Men   Women  1/2 Average Risk   3.4   3.3  Average Risk       5.0   4.4  2 X Average Risk   9.6   7.1  3 X Average Risk  23.4   11.0        Use the calculated Patient Ratio above and the CHD Risk Table to determine the  patient's CHD Risk.        ATP III CLASSIFICATION (LDL):  <100     mg/dL   Optimal  846-962  mg/dL   Near or Above                    Optimal  130-159  mg/dL   Borderline  952-841  mg/dL   High  >324     mg/dL   Very High Performed at [facility], [address], [place], [place] [address]    TSH 05/08/2022 3.264  0.350 - 4.500 uIU/mL Final   Comment: Performed by a 3rd Generation assay with a functional sensitivity of <=0.01 uIU/mL. Performed at [facility], [address], [place], [place] [address]   Admission on 03/20/2022, Discharged on 03/20/2022  Component Date Value Ref Range Status   WBC 03/20/2022 9.3  4.0 - 10.5 K/uL Final   RBC 03/20/2022 4.76  3.87 - 5.11 MIL/uL Final   Hemoglobin 03/20/2022 14.2  12.0 - 15.0 g/dL Final   HCT 64/40/3474 44.0  36.0 - 46.0 % Final   MCV 03/20/2022 92.4  80.0 - 100.0 fL Final   MCH 03/20/2022 29.8  26.0 - 34.0 pg Final   MCHC 03/20/2022 32.3  30.0 - 36.0 g/dL Final   RDW 25/95/6387 12.4  11.5 - 15.5 % Final   Platelets 03/20/2022 286  150 - 400 K/uL Final   nRBC 03/20/2022 0.0  0.0 - 0.2 % Final   Neutrophils Relative % 03/20/2022 56  % Final   Neutro Abs 03/20/2022 5.3  1.7 - 7.7 K/uL Final   Lymphocytes Relative 03/20/2022 32  % Final   Lymphs Abs 03/20/2022 3.0  0.7 - 4.0 K/uL Final   Monocytes Relative 03/20/2022 8  % Final   Monocytes Absolute 03/20/2022 0.7  0.1 - 1.0 K/uL Final   Eosinophils Relative 03/20/2022 2  % Final   Eosinophils Absolute 03/20/2022 0.2  0.0 - 0.5 K/uL Final   Basophils Relative 03/20/2022 1  % Final   Basophils Absolute 03/20/2022 0.1  0.0 - 0.1 K/uL Final   Immature Granulocytes 03/20/2022 1  % Final   Abs Immature Granulocytes 03/20/2022 0.06  0.00 - 0.07 K/uL Final   Performed at [facility], [address], [place], [place] [address]   Sodium 03/20/2022 140  135 - 145 mmol/L Final   Potassium 03/20/2022 4.2  3.5 - 5.1 mmol/L Final   Chloride 03/20/2022 106  98 - 111 mmol/L Final   CO2 03/20/2022 25  22 - 32 mmol/L Final   Glucose, Bld 03/20/2022 89  70 - 99 mg/dL Final   Glucose reference range applies only to samples taken after fasting for at least 8 hours.   BUN 03/20/2022 17  6 - 20 mg/dL Final   Creatinine, Ser 03/20/2022 0.64  0.44 - 1.00  mg/dL Final   Calcium 29/51/8841 9.2  8.9 - 10.3 mg/dL Final   Total Protein 66/01/3015 7.1  6.5 - 8.1 g/dL Final   Albumin 08/12/3233 4.1  3.5 - 5.0 g/dL Final   AST 57/32/2025 18  15 - 41 U/L Final   ALT 03/20/2022 26  0 - 44 U/L Final   Alkaline Phosphatase 03/20/2022 86  38 - 126 U/L Final   Total Bilirubin 03/20/2022 0.4  0.3 - 1.2 mg/dL Final   GFR, Estimated 03/20/2022 >60  >60 mL/min Final   Comment: (NOTE) Calculated using the CKD-EPI Creatinine Equation (2021)  Anion gap 03/20/2022 9  5 - 15 Final   Performed at [facility], [address] [place], [place] [address]   Color, Urine 03/20/2022 STRAW (A)  YELLOW Final   APPearance 03/20/2022 CLEAR  CLEAR Final   Specific Gravity, Urine 03/20/2022 1.008  1.005 - 1.030 Final   pH 03/20/2022 6.0  5.0 - 8.0 Final   Glucose, UA 03/20/2022 NEGATIVE  NEGATIVE mg/dL Final   Hgb urine dipstick 03/20/2022 NEGATIVE  NEGATIVE Final   Bilirubin Urine 03/20/2022 NEGATIVE  NEGATIVE Final   Ketones, ur 03/20/2022 NEGATIVE  NEGATIVE mg/dL Final   Protein, ur 19/14/7829 NEGATIVE  NEGATIVE mg/dL Final   Nitrite 56/21/3086 NEGATIVE  NEGATIVE Final   Leukocytes,Ua 03/20/2022 NEGATIVE  NEGATIVE Final   Performed at [facility], [address], [place], [place] [address]  Admission on 03/10/2022, Discharged on 03/20/2022  Component Date Value Ref Range Status   Magnesium 03/11/2022 1.9  1.7 - 2.4 mg/dL Final   Performed at [facility] [address] [place], [place] [address]   Hepatitis B Surface Ag 03/11/2022 NON REACTIVE  NON REACTIVE Final   HCV Ab 03/11/2022 NON REACTIVE  NON REACTIVE Final   Comment: (NOTE) Nonreactive HCV antibody screen is consistent with no HCV infections,  unless recent infection is suspected or other evidence exists to indicate HCV infection.     Hep A IgM 03/11/2022 NON REACTIVE  NON REACTIVE Final   Hep B C IgM 03/11/2022 NON REACTIVE  NON REACTIVE Final   Performed at [facility], [address] [place], [place] [address]   Vit D, 25-Hydroxy 03/11/2022 31.00  30 - 100 ng/mL Final   Comment: (NOTE) Vitamin D deficiency has been defined by the Institute of Medicine  and an Endocrine Society practice guideline as a level of serum 25-OH  vitamin D less than 20 ng/mL (1,2). The Endocrine Society went on to  further define vitamin D insufficiency as a level between 21 and 29  ng/mL (2).  1. IOM (Institute of Medicine). 2010. Dietary reference intakes for  calcium and D. [place]: [employer]. 2. Holick MF, Binkley Gove, Bischoff-Ferrari HA, et al. Evaluation,  treatment, and prevention of vitamin D deficiency: an Endocrine  Society clinical practice guideline, JCEM. 2011 Jul; 96(7): 1911-30.  Performed at [facility], [address], [place], [place] [address]    Vitamin B-12 03/11/2022 201  180 - 914 pg/mL Final   Comment: (NOTE) This assay is not validated for testing neonatal or myeloproliferative syndrome specimens for Vitamin B12 levels. Performed at [facility], [address] [place], [place] [address]    TSH 03/11/2022 3.487  0.350 - 4.500 uIU/mL Final   Comment: Performed by a 3rd Generation assay with a functional sensitivity of <=0.01 uIU/mL. Performed at [facility], [address], [place], [place] [address]    RPR Ser Ql 03/11/2022 NON REACTIVE  NON REACTIVE Final   Performed at [facility], [address], [place], [place] [address]   Cholesterol 03/13/2022 194  0 - 200 mg/dL Final   Triglycerides 25/95/6387 53  <150 mg/dL Final   HDL 56/43/3295 54  >40 mg/dL Final   Total CHOL/HDL Ratio 03/13/2022 3.6  RATIO Final   VLDL 03/13/2022 11  0 - 40 mg/dL Final   LDL Cholesterol 03/13/2022 129 (H)  0 - 99 mg/dL Final   Comment:  Total Cholesterol/HDL:CHD Risk Coronary Heart Disease Risk Table                     Men   Women  1/2 Average Risk   3.4   3.3   Average Risk       5.0   4.4  2 X Average Risk   9.6   7.1  3 X Average Risk  23.4   11.0        Use the calculated Patient Ratio above and the CHD Risk Table to determine the patient's CHD Risk.        ATP III CLASSIFICATION (LDL):  <100     mg/dL   Optimal  161-096  mg/dL   Near or Above                    Optimal  130-159  mg/dL   Borderline  045-409  mg/dL   High  >811     mg/dL   Very High Performed at [facility], [address] [place], [place] [address]    Hgb A1c MFr Bld 03/13/2022 5.1  4.8 - 5.6 % Final   Comment: (NOTE) Pre diabetes:          5.7%-6.4%  Diabetes:              >6.4%  Glycemic control for   <7.0% adults with diabetes    Mean Plasma Glucose 03/13/2022 99.67  mg/dL Final   Performed at [facility], [address], [place], [place] [address]   Sodium 03/14/2022 138  135 - 145 mmol/L Final   Potassium 03/14/2022 4.3  3.5 - 5.1 mmol/L Final   Chloride 03/14/2022 107  98 - 111 mmol/L Final   CO2 03/14/2022 26  22 - 32 mmol/L Final   Glucose, Bld 03/14/2022 89  70 - 99 mg/dL Final   Glucose reference range applies only to samples taken after fasting for at least 8 hours.   BUN 03/14/2022 17  6 - 20 mg/dL Final   Creatinine, Ser 03/14/2022 0.65  0.44 - 1.00 mg/dL Final   Calcium 13/03/6577 9.1  8.9 - 10.3 mg/dL Final   Total Protein 46/96/2952 6.5  6.5 - 8.1 g/dL Final   Albumin 84/13/2440 3.6  3.5 - 5.0 g/dL Final   AST 05/31/2535 14 (L)  15 - 41 U/L Final   ALT 03/14/2022 15  0 - 44 U/L Final   Alkaline Phosphatase 03/14/2022 71  38 - 126 U/L Final   Total Bilirubin 03/14/2022 0.3  0.3 - 1.2 mg/dL Final   GFR, Estimated 03/14/2022 >60  >60 mL/min Final   Comment: (NOTE) Calculated using the CKD-EPI Creatinine Equation (2021)    Anion gap 03/14/2022 5  5 - 15 Final   Performed at [facility], [address], [place], [place] [address]   Lipase 03/14/2022 32  11 - 51 U/L Final   Performed at [facility], [address] [place], [place] [address]   WBC 03/14/2022 8.8  4.0 - 10.5 K/uL Final   RBC 03/14/2022 4.37  3.87 - 5.11 MIL/uL Final   Hemoglobin 03/14/2022 13.2  12.0 - 15.0 g/dL Final   HCT 95/63/8756 39.9  36.0 - 46.0 % Final   MCV 03/14/2022 91.3  80.0 - 100.0 fL Final   MCH 03/14/2022 30.2  26.0 - 34.0 pg Final   MCHC 03/14/2022 33.1  30.0 - 36.0 g/dL  Final   RDW 03/14/2022 12.4  11.5 - 15.5 % Final   Platelets 03/14/2022 230  150 - 400 K/uL Final   nRBC 03/14/2022 0.0  0.0 - 0.2 % Final   Neutrophils Relative % 03/14/2022 52  % Final   Neutro Abs 03/14/2022 4.7  1.7 - 7.7 K/uL Final   Lymphocytes Relative 03/14/2022 36  % Final   Lymphs Abs 03/14/2022 3.1  0.7 - 4.0 K/uL Final   Monocytes Relative 03/14/2022 9  % Final   Monocytes Absolute 03/14/2022 0.8  0.1 - 1.0 K/uL Final   Eosinophils Relative 03/14/2022 2  % Final   Eosinophils Absolute 03/14/2022 0.2  0.0 - 0.5 K/uL Final   Basophils Relative 03/14/2022 0  % Final   Basophils Absolute 03/14/2022 0.0  0.0 - 0.1 K/uL Final   Immature Granulocytes 03/14/2022 1  % Final   Abs Immature Granulocytes 03/14/2022 0.04  0.00 - 0.07 K/uL Final   Performed at [facility], [address], [place], [place] [address]   Color, Urine 03/14/2022 YELLOW  YELLOW Final   APPearance 03/14/2022 HAZY (A)  CLEAR Final   Specific Gravity, Urine 03/14/2022 1.012  1.005 - 1.030 Final   pH 03/14/2022 6.0  5.0 - 8.0 Final   Glucose, UA 03/14/2022 NEGATIVE  NEGATIVE mg/dL Final   Hgb urine dipstick 03/14/2022 NEGATIVE  NEGATIVE Final   Bilirubin Urine 03/14/2022 NEGATIVE  NEGATIVE Final   Ketones, ur 03/14/2022 NEGATIVE  NEGATIVE mg/dL Final   Protein, ur 60/45/4098 NEGATIVE  NEGATIVE mg/dL Final   Nitrite 11/91/4782 NEGATIVE  NEGATIVE Final   Leukocytes,Ua 03/14/2022 LARGE (A)  NEGATIVE Final   RBC / HPF 03/14/2022 0-5  0 - 5 RBC/hpf Final   WBC, UA 03/14/2022 21-50  0 - 5 WBC/hpf Final   Bacteria,  UA 03/14/2022 RARE (A)  NONE SEEN Final   Squamous Epithelial / LPF 03/14/2022 0-5  0 - 5 Final   Performed at [facility], [address], [place], [place] [address]   Preg Test, Ur 03/14/2022 NEGATIVE  NEGATIVE Final   Comment:        THE SENSITIVITY OF THIS METHODOLOGY IS >20 mIU/mL. Performed at [facility], [address], [place], [place] [address]    Specimen Description 03/14/2022    Final                   Value:URINE, CLEAN CATCH Performed at [facility], [address], [place], [place] [address]    Special Requests 03/14/2022    Final                   Value:NONE Performed at [facility], [address] [place], [place] [address]    Culture 03/14/2022 MULTIPLE SPECIES PRESENT, SUGGEST RECOLLECTION (A)   Final   Report Status 03/14/2022 03/16/2022 FINAL   Final  There may be more visits with results that are not included.    Allergies: Patient has no known allergies.  PTA Medications: (Not in a hospital admission)   Long Term Goals: Improvement in symptoms so as ready for discharge  Short Term Goals: Patient will attend at least of 50% of the groups daily., Pt will complete the PHQ9 on admission, day 3 and discharge., and Patient will take medications as prescribed daily.  Medical Decision Making  Patient admitted to the facility based crisis unit for alcohol detox, and mood stabilization. Patient is voluntary.   AUD  Start ativan 1 mg po taper for alcohol withdrawal- may consider changing to librium once CMP results  Add CIWA Vistaril 25 mg po 3 times daily prn for anxiety Zofran 4 mg every 6 hours as needed for nausea or vomiting Imodium 2-4 mg po as needed for diarrhea  Thiamine 100 mg p.o. daily for nutritional supplementation Multivitamin 1 tablet by mouth daily for nutritional supplementation  Lab Orders         Resp panel by RT-PCR (RSV, Flu A&B, Covid) Anterior Nasal Swab          CBC with Differential/Platelet         Comprehensive metabolic panel         Ethanol         RPR         HIV Antibody (routine testing w rflx)         POCT Urine Drug Screen - (I-Screen)         POC urine preg, ED         POC SARS Coronavirus 2 Ag     Disposition-patient is seeking long-term residential substance abuse treatment upon completing detox protocol.  Recommendations  Based on my evaluation the patient does not appear to have an emergency medical condition.  Layla Barter, NP 07/17/22  3:01 PM

## 2022-07-17 NOTE — ED Notes (Signed)
Pt. Refused AA

## 2022-07-17 NOTE — Progress Notes (Signed)
Patient presents to the [facility] from [facility]. In the past few days, he states that she went to [facility] ED and then [facility] ED and was discharged from there as well as [facility] and was discharged. Patient states that she she drinks four 4LOCOS and vodka. Her last drink was today, one [facility]. Patient states that she is currently experiencing nausea, diarrhea, nausea, headaces, chills and sweats. Patient states that she has never had any history of seizures. Dhe states that she may have experienced DTs, she states that she has experienced visual hallucinations. Patient states that she has a history of depression, but states that she has not been taking her medication. Patient states that she has suicidal ideation at times. A few days ago, she states that she thought about shooting up drugs to kill herself. Patient states that she has no prior history of suicide attempts. Patient denies HI/Psychosis. Patient states that her sleep and appetite are not good. Patient is urgent due to her need for detox.

## 2022-07-18 ENCOUNTER — Encounter (HOSPITAL_COMMUNITY): Payer: Self-pay

## 2022-07-18 DIAGNOSIS — F10230 Alcohol dependence with withdrawal, uncomplicated: Secondary | ICD-10-CM | POA: Diagnosis not present

## 2022-07-18 DIAGNOSIS — Z1152 Encounter for screening for COVID-19: Secondary | ICD-10-CM | POA: Diagnosis not present

## 2022-07-18 DIAGNOSIS — F1911 Other psychoactive substance abuse, in remission: Secondary | ICD-10-CM | POA: Diagnosis not present

## 2022-07-18 LAB — GC/CHLAMYDIA PROBE AMP (~~LOC~~) NOT AT ARMC
Chlamydia: POSITIVE — AB
Comment: NEGATIVE
Comment: NORMAL
Neisseria Gonorrhea: NEGATIVE

## 2022-07-18 LAB — RPR: RPR Ser Ql: NONREACTIVE

## 2022-07-18 MED ORDER — DOXYCYCLINE HYCLATE 100 MG PO TABS
100.0000 mg | ORAL_TABLET | Freq: Two times a day (BID) | ORAL | Status: DC
  Administered 2022-07-18 – 2022-07-22 (×8): 100 mg via ORAL
  Filled 2022-07-18 (×2): qty 1
  Filled 2022-07-18: qty 8
  Filled 2022-07-18 (×6): qty 1

## 2022-07-18 NOTE — ED Notes (Signed)
Patient is asleep in bed without issue or complaint.  Will monitor.   °

## 2022-07-18 NOTE — ED Notes (Signed)
Pt sleeping@this time. Breathing even and unlabored. Will continue to monitor for safety 

## 2022-07-18 NOTE — Discharge Planning (Signed)
Referral was received and per Lanora Manis, patient has been accepted and can transfer to the facility on Tuesday 07/22/22 by 9:00am. Update has been provided to the patient and MD made aware. Patient will need a 14-30 day supply of medication and one month refill. No nicotine gum allowed, however 14-30 day nicotine patches to be provided if needed. No other needs to report at this time.    LCSW will continue to follow up and provide updates as received.    Fernande Boyden, LCSW Clinical Social Worker [facility] Ph: [phone]

## 2022-07-18 NOTE — BH IP Treatment Plan (Signed)
Interdisciplinary Treatment and Diagnostic Plan Update  07/18/2022 Time of Session: 11:00AM Kristina Owens MRN: [id number]  Diagnosis:  Final diagnoses:  Alcohol-induced mood disorder ([place])  Alcohol dependence with uncomplicated withdrawal ([place])     Current Medications:  Current Facility-Administered Medications  Medication Dose Route Frequency Provider Last Rate Last Admin   acetaminophen (TYLENOL) tablet 650 mg  650 mg Oral Q6H PRN White, Patrice L, NP       alum & mag hydroxide-simeth (MAALOX/MYLANTA) 200-200-20 MG/5ML suspension 30 mL  30 mL Oral Q4H PRN White, Patrice L, NP       hydrOXYzine (ATARAX) tablet 25 mg  25 mg Oral TID PRN White, Patrice L, NP   25 mg at 07/17/22 1826   loperamide (IMODIUM) capsule 2-4 mg  2-4 mg Oral PRN White, Patrice L, NP   2 mg at 07/17/22 2133   LORazepam (ATIVAN) tablet 1 mg  1 mg Oral Q6H PRN White, Patrice L, NP       LORazepam (ATIVAN) tablet 1 mg  1 mg Oral TID White, Patrice L, NP       Followed by   Derrill Memo ON 07/19/2022] LORazepam (ATIVAN) tablet 1 mg  1 mg Oral BID White, Patrice L, NP       Followed by   Derrill Memo ON 07/21/2022] LORazepam (ATIVAN) tablet 1 mg  1 mg Oral Daily White, Patrice L, NP       magnesium hydroxide (MILK OF MAGNESIA) suspension 30 mL  30 mL Oral Daily PRN White, Patrice L, NP       multivitamin with minerals tablet 1 tablet  1 tablet Oral Daily White, Patrice L, NP   1 tablet at 07/18/22 0904   ondansetron (ZOFRAN-ODT) disintegrating tablet 4 mg  4 mg Oral Q6H PRN White, Patrice L, NP   4 mg at 07/17/22 2134   thiamine (VITAMIN B1) injection 100 mg  100 mg Intramuscular Once White, Patrice L, NP       thiamine (VITAMIN B1) tablet 100 mg  100 mg Oral Daily White, Patrice L, NP   100 mg at 07/18/22 0904   Current Outpatient Medications  Medication Sig Dispense Refill   ARIPiprazole (ABILIFY) 15 MG tablet Take 1 tablet (15 mg total) by mouth daily. 30 tablet 0   escitalopram (LEXAPRO) 5 MG tablet Take 3 tablets (15  mg total) by mouth daily. 90 tablet 0   gabapentin (NEURONTIN) 100 MG capsule Take 2 capsules (200 mg total) by mouth 3 (three) times daily. 180 capsule 0   ibuprofen (ADVIL) 800 MG tablet Take 1 tablet (800 mg total) by mouth 3 (three) times daily. 21 tablet 0   Multiple Vitamin (MULTIVITAMIN WITH MINERALS) TABS tablet Take 1 tablet by mouth daily.     ondansetron (ZOFRAN) 4 MG tablet Take 1 tablet (4 mg total) by mouth every 6 (six) hours. 12 tablet 0   rivaroxaban (XARELTO) 20 MG TABS tablet Take 1 tablet (20 mg total) by mouth daily with supper. 30 tablet 0   traZODone (DESYREL) 50 MG tablet Take 1 tablet (50 mg total) by mouth at bedtime as needed for sleep. 30 tablet 0   cephALEXin (KEFLEX) 500 MG capsule Take 1 capsule (500 mg total) by mouth 4 (four) times daily. (Patient not taking: Reported on 07/11/2022) 20 capsule 0   PTA Medications: Prior to Admission medications   Medication Sig Start Date End Date Taking? Authorizing Provider  ARIPiprazole (ABILIFY) 15 MG tablet Take 1 tablet (15 mg total) by mouth daily.  03/20/22 07/17/22 Yes Massengill, Harrold Donath, MD  escitalopram (LEXAPRO) 5 MG tablet Take 3 tablets (15 mg total) by mouth daily. 03/20/22 07/17/22 Yes Massengill, Harrold Donath, MD  gabapentin (NEURONTIN) 100 MG capsule Take 2 capsules (200 mg total) by mouth 3 (three) times daily. 03/19/22 07/17/22 Yes Massengill, Harrold Donath, MD  ibuprofen (ADVIL) 800 MG tablet Take 1 tablet (800 mg total) by mouth 3 (three) times daily. 07/14/22  Yes Redwine, Madison A, PA-C  Multiple Vitamin (MULTIVITAMIN WITH MINERALS) TABS tablet Take 1 tablet by mouth daily. 03/20/22  Yes Massengill, Harrold Donath, MD  ondansetron (ZOFRAN) 4 MG tablet Take 1 tablet (4 mg total) by mouth every 6 (six) hours. 07/14/22  Yes Redwine, Madison A, PA-C  rivaroxaban (XARELTO) 20 MG TABS tablet Take 1 tablet (20 mg total) by mouth daily with supper. 03/19/22 07/17/22 Yes Massengill, Harrold Donath, MD  traZODone (DESYREL) 50 MG tablet Take 1 tablet  (50 mg total) by mouth at bedtime as needed for sleep. 03/19/22 07/17/22 Yes Massengill, Harrold Donath, MD  cephALEXin (KEFLEX) 500 MG capsule Take 1 capsule (500 mg total) by mouth 4 (four) times daily. Patient not taking: Reported on 07/11/2022 07/02/22   Smoot, Shawn Route, PA-C    Patient Stressors: Substance abuse   Other: homelesness    Patient Strengths: Ability for insight  Average or above average intelligence  Capable of independent living  General fund of knowledge   Treatment Modalities: Medication Management, Group therapy, Case management,  1 to 1 session with clinician, Psychoeducation, Recreational therapy.   Physician Treatment Plan for Primary and Secondary Diagnosis:  Final diagnoses:  Alcohol-induced mood disorder (HCC)  Alcohol dependence with uncomplicated withdrawal (H[facility])   Long Term Goal(s): Improvement in symptoms so as ready for discharge  Short Term Goals: Patient will attend at least of 50% of the groups daily. Pt will complete the PHQ9 on admission, day 3 and discharge. Patient will take medications as prescribed daily.  Medication Management: Evaluate patient's response, side effects, and tolerance of medication regimen.  Therapeutic Interventions: 1 to 1 sessions, Unit Group sessions and Medication administration.  Evaluation of Outcomes: Progressing  LCSW Treatment Plan for Primary Diagnosis:  Final diagnoses:  Alcohol-induced mood disorder (HCC)  Alcohol dependence with uncomplicated withdrawal (HCC)    Long Term Goal(s): Safe transition to appropriate next level of care at discharge.  Short Term Goals: Facilitate acceptance of mental health diagnosis and concerns through verbal commitment to aftercare plan and appointments at discharge., Patient will identify one social support prior to discharge to aid in patient's recovery., Patient will attend AA/NA groups as scheduled., Identify minimum of 2 triggers associated with mental health/substance abuse  issues with treatment team members., and Increase skills for wellness and recovery by attending 50% of scheduled groups.  Therapeutic Interventions: Assess for all discharge needs, 1 to 1 time with Child psychotherapist, Explore available resources and support systems, Assess for adequacy in community support network, Educate family and significant other(s) on suicide prevention, Complete Psychosocial Assessment, Interpersonal group therapy.  Evaluation of Outcomes: Progressing   Progress in Treatment: Attending groups: No. Patient is new to the [facility] and has been encouraged to participate in programming on unit.  Participating in groups: No. Patient is new to the [facility] and has been encouraged to participate in programming on unit.  Taking medication as prescribed: Yes. Toleration medication: Yes. Family/Significant other contact made: No, will contact:  Patient did not provide permission for LCSW to gain collateral at this time stating she has no support.  Patient understands  diagnosis: Yes. Discussing patient identified problems/goals with staff: Yes. Medical problems stabilized or resolved: Yes. Denies suicidal/homicidal ideation: Yes. Issues/concerns per patient self-inventory: Yes. Other: substance abuse and homelessness  New problem(s) identified: No, Describe:  other than reported on admission.   New Short Term/Long Term Goal(s): Safe transition to appropriate next level of care at discharge, Engage patient in therapeutic group addressing interpersonal concerns. Engage patient in aftercare planning with referrals and resources, Increase ability to appropriately verbalize feelings, Facilitate acceptance of mental health diagnosis and concerns and Identify triggers associated with mental health/substance abuse issues.    Patient Goals:  Patient is seeking residential placement at this time for substance use.   Discharge Plan or Barriers: LCSW will send referrals out for review for residential  placement. Updates will be provided as received.    Reason for Continuation of Hospitalization: Other; describe residential placement  Estimated Length of Stay: 3-5 days  Last [place] Suicide Severity Risk Score: [place] ED from 07/17/2022 in [facility] Most recent reading at 07/17/2022  6:11 PM ED from 07/15/2022 in [place] DEPT Most recent reading at 07/15/2022  8:29 AM Admission (Pending) from OP Visit from 07/15/2022 in [place] Most recent reading at 07/15/2022 12:36 AM  C-SSRS RISK CATEGORY No Risk No Risk No Risk       Last PHQ 2/9 Scores:    07/17/2022    3:00 PM 07/17/2022    2:11 PM 05/08/2022    9:45 AM  Depression screen PHQ 2/9  Decreased Interest 3 3 1   Down, Depressed, Hopeless 3 3 1   PHQ - 2 Score 6 6 2   Altered sleeping 2 3 1   Tired, decreased energy 3 3 1   Change in appetite 0 3 1  Feeling bad or failure about yourself  3 3 1   Trouble concentrating 3 3 1   Moving slowly or fidgety/restless 0 3 1  Suicidal thoughts 2 3 3   PHQ-9 Score 19 27 11   Difficult doing work/chores Extremely dIfficult Very difficult Somewhat difficult    Scribe for Treatment Team: Gigi Gin 07/18/2022 11:42 AM

## 2022-07-18 NOTE — Tx Team (Signed)
LCSW and MD met with patient to assess current mood, affect, physical state, and inquire about needs/goals while here in [name] and after discharge. Patient appeared to be malingering during assessment. Patient presented to [name] via EMS due to alcohol withdrawal symptoms. Pt reported she had drank two 'Four Lokos' and a 'Jamse Mead' prior to presenting to the ED. Pt reported symptoms including nausea, tremors and a headache. Pt denied SI or HI at time of admission. However, patient did endorse recent auditory and visual hallucinations stating she was hearing voices telling her which way to walk and seeing dead people. 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 Pt reported that she had been using alcohol since the age of 53. Pt identified her primary stressor as being homeless. Pt reported that she has been homeless for three to four years. Pt stated she has no family supports. Pt reported a history of depression and anxiety. Pt also reported her maternal grandmother suffered from schizophrenia. Pt reported hat she has an upcoming court date in January, due to a shoplifting charge she obtained a few days ago. Pt denies currently receiving outpatient services. Patient was hospitalized at [name] in August 2023 for two weeks. Pt was supposed to follow-up with [name], however reports she was having back pain she needed to address, and she lost the bed. Pt says she has had numerous hospitalizations in the past to address mental health and substance use. Patient reports her current goal is to seek residential placement at this time. Patient aware that LCSW will send out referrals for review and will follow up to provide updates as received.    [name], LCSW Clinical Social Worker [facility] Ph: [phone]

## 2022-07-18 NOTE — ED Notes (Signed)
Pt sleeping@this time. Breathing even and unlabored. Will continue to monitor for saftey 

## 2022-07-18 NOTE — ED Provider Notes (Signed)
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 effect (meaning this test can be used) for the duration of the COVID-19 declaration under Section 564(b)(1) of the Act, 21 U.S.C. section 360bbb-3(b)(1), unless the authorization is terminated  or revoked.  Performed at [facility], [address], [place], [place] [address]    I-stat hCG, quantitative 07/11/2022 <5.0  <5 mIU/mL Final   Comment 3 07/11/2022          Final   Comment:   GEST. AGE      CONC.  (mIU/mL)   <=1 WEEK        5 - 50     2 WEEKS       50 - 500     3 WEEKS       100 - 10,000     4 WEEKS     1,000 - 30,000        FEMALE AND NON-PREGNANT FEMALE:     LESS THAN 5 mIU/mL    Troponin I (High Sensitivity) 07/11/2022 2  <18 ng/L Final   Comment: (NOTE) Elevated high sensitivity troponin I (hsTnI) values and significant  changes across serial measurements may suggest ACS but many other  chronic and acute conditions are known  to elevate hsTnI results.  Refer to the "Links" section for chest pain algorithms and additional  guidance. Performed at [facility], [address] [place], [place] [address]   Admission on 07/02/2022, Discharged on 07/02/2022  Component Date Value Ref Range Status   Color, Urine 07/02/2022 YELLOW  YELLOW Final   APPearance 07/02/2022 HAZY (A)  CLEAR Final   Specific Gravity, Urine 07/02/2022 1.014  1.005 - 1.030 Final   pH 07/02/2022 5.0  5.0 - 8.0 Final   Glucose, UA 07/02/2022 NEGATIVE  NEGATIVE mg/dL Final   Hgb urine dipstick 07/02/2022 SMALL (A)  NEGATIVE Final   Bilirubin Urine 07/02/2022 NEGATIVE  NEGATIVE Final   Ketones, ur 07/02/2022 NEGATIVE  NEGATIVE mg/dL Final   Protein, ur 60/45/4098 NEGATIVE  NEGATIVE mg/dL Final   Nitrite 11/91/4782 POSITIVE (A)  NEGATIVE Final   Leukocytes,Ua 07/02/2022 LARGE (A)  NEGATIVE Final   RBC / HPF 07/02/2022 6-10  0 - 5 RBC/hpf Final   WBC, UA 07/02/2022 >50 (H)  0 - 5 WBC/hpf Final   Bacteria, UA 07/02/2022 FEW (A)  NONE SEEN Final   Squamous Epithelial / LPF 07/02/2022 6-10  0 - 5 Final   Mucus 07/02/2022 PRESENT   Final   Non Squamous Epithelial 07/02/2022 0-5 (A)  NONE SEEN Final   Performed at [facility], [address] [place], [place]  [address]   WBC 07/02/2022 9.9  4.0 - 10.5 K/uL Final   RBC 07/02/2022 5.18 (H)  3.87 - 5.11 MIL/uL Final   Hemoglobin 07/02/2022 15.8 (H)  12.0 - 15.0 g/dL Final   HCT 30/86/5784 48.5 (H)  36.0 - 46.0 % Final   MCV 07/02/2022 93.6  80.0 - 100.0 fL Final   MCH 07/02/2022 30.5  26.0 - 34.0 pg Final   MCHC 07/02/2022 32.6  30.0 - 36.0 g/dL Final   RDW 69/62/9528 13.5  11.5 - 15.5 % Final   Platelets 07/02/2022 249  150 - 400 K/uL Final   nRBC 07/02/2022 0.0  0.0 - 0.2 % Final   Performed at [facility], [address] [place], [place] [address]   Sodium 07/02/2022 138  135 - 145 mmol/L Final   Potassium 07/02/2022 3.9  3.5 - 5.1 mmol/L Final   Chloride 07/02/2022 105  98 - 111 mmol/L Final   CO2 07/02/2022 26  22 - 32 mmol/L Final   Glucose, Bld 07/02/2022 114 (H)  70 - 99 mg/dL Final   Glucose reference range applies only to samples taken after fasting for at least 8 hours.   BUN 07/02/2022 9  6 - 20 mg/dL Final   Creatinine, Ser 07/02/2022 0.64  0.44 - 1.00 mg/dL Final   Calcium 40/05/2724 8.9  8.9 - 10.3 mg/dL Final   GFR, Estimated 07/02/2022 >60  >60 mL/min Final   Comment: (NOTE) Calculated using the CKD-EPI Creatinine Equation (2021)    Anion gap 07/02/2022 7  5 - 15 Final   Performed at [place], [address] [place], [place] [address]   Lipase 07/02/2022 35  11 - 51 U/L Final   Performed at [facility], [address], [place], [place] [address]   Total Protein 07/02/2022 6.9  6.5 - 8.1 g/dL Final   Albumin 25/95/6387 3.8  3.5 - 5.0 g/dL Final   AST 56/43/3295 17  15 - 41 U/L Final   ALT 07/02/2022 16  0 - 44 U/L Final   Alkaline Phosphatase 07/02/2022  91  38 - 126 U/L Final   Total Bilirubin 07/02/2022 0.9  0.3 - 1.2 mg/dL Final   Bilirubin, Direct 07/02/2022 0.1  0.0 - 0.2 mg/dL Final   Indirect Bilirubin 07/02/2022 0.8  0.3 - 0.9 mg/dL Final   Performed at [facility], [address] [place], [place] [address]  Admission on 06/06/2022, Discharged on 06/06/2022  Component Date Value Ref Range Status   Sodium 06/06/2022 140  135 - 145 mmol/L Final   Potassium 06/06/2022 3.2 (L)  3.5 - 5.1 mmol/L Final   Chloride 06/06/2022 103  98 - 111 mmol/L Final   CO2 06/06/2022 26  22 - 32 mmol/L Final   Glucose, Bld 06/06/2022 97  70 - 99 mg/dL Final   Glucose reference range applies only to samples taken after fasting for at least 8 hours.   BUN 06/06/2022 6  6 - 20 mg/dL Final   Creatinine, Ser 06/06/2022 0.54  0.44 - 1.00 mg/dL Final   Calcium 60/45/409811/10/2021 9.0  8.9 - 10.3 mg/dL Final   Total Protein 11/91/478211/10/2021 6.5  6.5 - 8.1 g/dL Final   Albumin 95/62/130811/10/2021 3.6  3.5 - 5.0 g/dL Final   AST 65/78/469611/10/2021 20  15 - 41 U/L Final   ALT 06/06/2022 17  0 - 44 U/L Final   Alkaline Phosphatase 06/06/2022 111  38 - 126 U/L Final   Total Bilirubin 06/06/2022 0.3  0.3 - 1.2 mg/dL Final   GFR, Estimated 06/06/2022 >60  >60 mL/min Final   Comment: (NOTE) Calculated using the CKD-EPI Creatinine Equation (2021)    Anion gap 06/06/2022 11  5 - 15 Final   Performed at [facility], [address] [place], [place] [address]   Alcohol, Ethyl (B) 06/06/2022 18 (H)  <10 mg/dL Final   Comment: (NOTE) Lowest detectable limit for serum alcohol is 10 mg/dL.  For medical purposes only. Performed at [facility], [address] [place], [place] [address]    WBC 06/06/2022 6.0  4.0 - 10.5 K/uL Final   RBC 06/06/2022 4.76  3.87 - 5.11 MIL/uL Final   Hemoglobin 06/06/2022 14.5  12.0 - 15.0 g/dL Final   HCT 40/10/272511/10/2021 43.9  36.0 - 46.0 % Final   MCV 06/06/2022 92.2  80.0 - 100.0 fL Final   MCH 06/06/2022 30.5  26.0 - 34.0 pg Final   MCHC 06/06/2022 33.0  30.0 - 36.0 g/dL Final   RDW 36/64/403411/10/2021 13.5  11.5 - 15.5 % Final   Platelets 06/06/2022 224  150 - 400 K/uL Final   nRBC 06/06/2022 0.0  0.0 - 0.2 % Final   Performed at [facility], [address] [place], [place] [address]    I-stat hCG, quantitative 06/06/2022 <5.0  <5 mIU/mL Final   Comment 3 06/06/2022          Final   Comment:   GEST. AGE      CONC.  (mIU/mL)   <=1 WEEK        5 - 50     2 WEEKS       50 - 500     3 WEEKS       100 - 10,000     4 WEEKS     1,000 - 30,000        FEMALE AND NON-PREGNANT FEMALE:     LESS THAN 5 mIU/mL   Admission on 06/05/2022, Discharged on 06/05/2022  Component Date Value Ref Range Status  Sodium 06/05/2022 139  135 - 145 mmol/L Final   Potassium 06/05/2022 3.7  3.5 - 5.1 mmol/L Final   Chloride 06/05/2022 105  98 - 111 mmol/L Final   CO2 06/05/2022 26  22 - 32 mmol/L Final   Glucose, Bld 06/05/2022 114 (H)  70 - 99 mg/dL Final   Glucose reference range applies only to samples taken after fasting for at least 8 hours.   BUN 06/05/2022 8  6 - 20 mg/dL Final   Creatinine, Ser 06/05/2022 0.50  0.44 - 1.00 mg/dL Final   Calcium 16/05/9603 8.8 (L)  8.9 - 10.3 mg/dL Final   Total Protein 54/04/8118 7.1  6.5 - 8.1 g/dL Final   Albumin 14/78/2956 3.9  3.5 - 5.0 g/dL Final   AST 21/30/8657 20  15 - 41 U/L Final   ALT 06/05/2022 17  0 - 44 U/L Final   Alkaline Phosphatase 06/05/2022 120  38 - 126 U/L Final   Total Bilirubin 06/05/2022 0.5  0.3 - 1.2 mg/dL Final   GFR, Estimated 06/05/2022 >60  >60 mL/min Final   Comment: (NOTE) Calculated using the CKD-EPI Creatinine Equation (2021)    Anion gap 06/05/2022 8  5 - 15 Final   Performed at [facility], [address], [place], [place] [address]   WBC 06/05/2022 4.9  4.0 - 10.5 K/uL Final   RBC 06/05/2022 5.00  3.87 - 5.11 MIL/uL Final   Hemoglobin 06/05/2022 15.1 (H)  12.0 - 15.0 g/dL Final   HCT 29/52/8413 46.0  36.0 - 46.0 % Final   MCV 06/05/2022 92.0  80.0 - 100.0 fL Final   MCH 06/05/2022 30.2  26.0 - 34.0 pg Final   MCHC 06/05/2022 32.8  30.0 - 36.0 g/dL Final   RDW 24/40/1027 13.6  11.5 - 15.5 % Final   Platelets 06/05/2022 248  150 - 400 K/uL Final   nRBC 06/05/2022 0.0  0.0 - 0.2 % Final    Performed at [facility], [address], [place], [place] [address]   Opiates 06/05/2022 NONE DETECTED  NONE DETECTED Final   Cocaine 06/05/2022 NONE DETECTED  NONE DETECTED Final   Benzodiazepines 06/05/2022 NONE DETECTED  NONE DETECTED Final   Amphetamines 06/05/2022 POSITIVE (A)  NONE DETECTED Final   Tetrahydrocannabinol 06/05/2022 NONE DETECTED  NONE DETECTED Final   Barbiturates 06/05/2022 NONE DETECTED  NONE DETECTED Final   Comment: (NOTE) DRUG SCREEN FOR MEDICAL PURPOSES ONLY.  IF CONFIRMATION IS NEEDED FOR ANY PURPOSE, NOTIFY LAB WITHIN 5 DAYS.  LOWEST DETECTABLE LIMITS FOR URINE DRUG SCREEN Drug Class                     Cutoff (ng/mL) Amphetamine and metabolites    1000 Barbiturate and metabolites    200 Benzodiazepine                 200 Opiates and metabolites        300 Cocaine and metabolites        300 THC                            50 Performed at [facility], [address] [place], [place] [address]    Alcohol, Ethyl (B) 06/05/2022 <10  <10 mg/dL Final   Comment: (NOTE) Lowest detectable limit for serum alcohol is 10 mg/dL.  For medical purposes only. Performed at [facility], [address] [place], [place] [address]   Admission on 05/10/2022, Discharged on 05/10/2022  Component Date Value Ref Range Status   Opiates 05/10/2022 NONE DETECTED  NONE DETECTED Final   Cocaine 05/10/2022 POSITIVE (A)  NONE DETECTED Final   Benzodiazepines 05/10/2022 NONE DETECTED  NONE DETECTED Final   Amphetamines 05/10/2022 NONE DETECTED  NONE DETECTED Final   Tetrahydrocannabinol 05/10/2022 NONE DETECTED  NONE DETECTED Final   Barbiturates 05/10/2022 NONE DETECTED  NONE DETECTED Final   Comment: (NOTE) DRUG SCREEN FOR MEDICAL PURPOSES ONLY.  IF CONFIRMATION IS NEEDED FOR ANY PURPOSE, NOTIFY LAB WITHIN 5 DAYS.  LOWEST DETECTABLE LIMITS FOR URINE DRUG SCREEN Drug Class                     Cutoff  (ng/mL) Amphetamine and metabolites    1000 Barbiturate and metabolites    200 Benzodiazepine                 200 Tricyclics and metabolites     300 Opiates and metabolites        300 Cocaine and metabolites        300 THC                            50 Performed at [facility], [address] [place], [place] [address]    Alcohol, Ethyl (B) 05/10/2022 <10  <10 mg/dL Final   Comment: (NOTE) Lowest detectable limit for serum alcohol is 10 mg/dL.  For medical purposes only. Performed at [facility], [address], [place], [place] [address]    Sodium 05/10/2022 137  135 - 145 mmol/L Final   Potassium 05/10/2022 4.2  3.5 - 5.1 mmol/L Final   Chloride 05/10/2022 102  98 - 111 mmol/L Final   CO2 05/10/2022 29  22 - 32 mmol/L Final   Glucose, Bld 05/10/2022 90  70 - 99 mg/dL Final   Glucose reference range applies only to samples taken after fasting for at least 8 hours.   BUN 05/10/2022 11  6 - 20 mg/dL Final   Creatinine, Ser 05/10/2022 0.57  0.44 - 1.00 mg/dL Final   Calcium 13/24/4010 8.7 (L)  8.9 - 10.3 mg/dL Final   Total Protein 27/25/3664 7.2  6.5 - 8.1 g/dL Final   Albumin 40/34/7425 3.7  3.5 - 5.0 g/dL Final   AST 95/63/8756 18  15 - 41 U/L Final   ALT 05/10/2022 16  0 - 44 U/L Final   Alkaline Phosphatase 05/10/2022 97  38 - 126 U/L Final   Total Bilirubin 05/10/2022 0.5  0.3 - 1.2 mg/dL Final   GFR, Estimated 05/10/2022 >60  >60 mL/min Final   Comment: (NOTE) Calculated using the CKD-EPI Creatinine Equation (2021)    Anion gap 05/10/2022 6  5 - 15 Final   Performed at [facility], [address] [place], [place] [address]   WBC 05/10/2022 6.4  4.0 - 10.5 K/uL Final   RBC 05/10/2022 5.56 (H)  3.87 - 5.11 MIL/uL Final   Hemoglobin 05/10/2022 16.3 (H)  12.0 - 15.0 g/dL Final   HCT 51/88/4166 50.7 (H)  36.0 - 46.0 % Final   MCV 05/10/2022 91.2  80.0 - 100.0 fL Final   MCH 05/10/2022 29.3  26.0 - 34.0 pg Final    MCHC 05/10/2022 32.1  30.0 - 36.0 g/dL Final   RDW 02/01/1600 13.2  11.5 - 15.5 %  Final   Platelets 05/10/2022 250  150 - 400 K/uL Final   nRBC 05/10/2022 0.0  0.0 - 0.2 % Final   Neutrophils Relative % 05/10/2022 49  % Final   Neutro Abs 05/10/2022 3.2  1.7 - 7.7 K/uL Final   Lymphocytes Relative 05/10/2022 40  % Final   Lymphs Abs 05/10/2022 2.6  0.7 - 4.0 K/uL Final   Monocytes Relative 05/10/2022 8  % Final   Monocytes Absolute 05/10/2022 0.5  0.1 - 1.0 K/uL Final   Eosinophils Relative 05/10/2022 1  % Final   Eosinophils Absolute 05/10/2022 0.0  0.0 - 0.5 K/uL Final   Basophils Relative 05/10/2022 1  % Final   Basophils Absolute 05/10/2022 0.0  0.0 - 0.1 K/uL Final   Immature Granulocytes 05/10/2022 1  % Final   Abs Immature Granulocytes 05/10/2022 0.04  0.00 - 0.07 K/uL Final   Performed at [facility], [address] [place], [place] [address]   Lipase 05/10/2022 34  11 - 51 U/L Final   Performed at [facility], [address] [place], [place] [address]  Admission on 05/08/2022, Discharged on 05/09/2022  Component Date Value Ref Range Status   SARS Coronavirus 2 by RT PCR 05/08/2022 NEGATIVE  NEGATIVE Final   Comment: (NOTE) SARS-CoV-2 target nucleic acids are NOT DETECTED.  The SARS-CoV-2 RNA is generally detectable in upper respiratory specimens during the acute phase of infection. The lowest concentration of SARS-CoV-2 viral copies this assay can detect is 138 copies/mL. A negative result does not preclude SARS-Cov-2 infection and should not be used as the sole basis for treatment or other patient management decisions. A negative result may occur with  improper specimen collection/handling, submission of specimen other than nasopharyngeal swab, presence of viral mutation(s) within the areas targeted by this assay, and inadequate number of viral copies(<138 copies/mL). A negative result must be combined with clinical  observations, patient history, and epidemiological information. The expected result is Negative.  Fact Sheet for Patients:  BloggerCourse.com  Fact Sheet for Healthcare Providers:  SeriousBroker.it  This test is no                          t yet approved or cleared by the [place] FDA and  has been authorized for detection and/or diagnosis of SARS-CoV-2 by FDA under an Emergency Use Authorization (EUA). This EUA will remain  in effect (meaning this test can be used) for the duration of the COVID-19 declaration under Section 564(b)(1) of the Act, 21 U.S.C.section 360bbb-3(b)(1), unless the authorization is terminated  or revoked sooner.       Influenza A by PCR 05/08/2022 NEGATIVE  NEGATIVE Final   Influenza B by PCR 05/08/2022 NEGATIVE  NEGATIVE Final   Comment: (NOTE) The Xpert Xpress SARS-CoV-2/FLU/RSV plus assay is intended as an aid in the diagnosis of influenza from Nasopharyngeal swab specimens and should not be used as a sole basis for treatment. Nasal washings and aspirates are unacceptable for Xpert Xpress SARS-CoV-2/FLU/RSV testing.  Fact Sheet for Patients: BloggerCourse.com  Fact Sheet for Healthcare Providers: SeriousBroker.it  This test is not yet approved or cleared by the [place] FDA and has been authorized for detection and/or diagnosis of SARS-CoV-2 by FDA under an Emergency Use Authorization (EUA). This EUA will remain in effect (meaning this test can be used) for the duration of the COVID-19 declaration under Section 564(b)(1) of the Act, 21 U.S.C. section 360bbb-3(b)(1), unless the  authorization is terminated or revoked.  Performed at [facility], [address], [place], [place] [address]    WBC 05/08/2022 6.5  4.0 - 10.5 K/uL Final   RBC 05/08/2022 5.36 (H)  3.87 - 5.11 MIL/uL Final   Hemoglobin 05/08/2022 16.1 (H)  12.0 - 15.0 g/dL  Final   HCT 32/35/5732 47.9 (H)  36.0 - 46.0 % Final   MCV 05/08/2022 89.4  80.0 - 100.0 fL Final   MCH 05/08/2022 30.0  26.0 - 34.0 pg Final   MCHC 05/08/2022 33.6  30.0 - 36.0 g/dL Final   RDW 20/25/4270 13.2  11.5 - 15.5 % Final   Platelets 05/08/2022 237  150 - 400 K/uL Final   nRBC 05/08/2022 0.0  0.0 - 0.2 % Final   Neutrophils Relative % 05/08/2022 37  % Final   Neutro Abs 05/08/2022 2.4  1.7 - 7.7 K/uL Final   Lymphocytes Relative 05/08/2022 52  % Final   Lymphs Abs 05/08/2022 3.4  0.7 - 4.0 K/uL Final   Monocytes Relative 05/08/2022 8  % Final   Monocytes Absolute 05/08/2022 0.5  0.1 - 1.0 K/uL Final   Eosinophils Relative 05/08/2022 1  % Final   Eosinophils Absolute 05/08/2022 0.1  0.0 - 0.5 K/uL Final   Basophils Relative 05/08/2022 1  % Final   Basophils Absolute 05/08/2022 0.0  0.0 - 0.1 K/uL Final   Immature Granulocytes 05/08/2022 1  % Final   Abs Immature Granulocytes 05/08/2022 0.05  0.00 - 0.07 K/uL Final   Performed at [facility], [address] [place], [place] [address]   Sodium 05/08/2022 135  135 - 145 mmol/L Final   Potassium 05/08/2022 3.9  3.5 - 5.1 mmol/L Final   Chloride 05/08/2022 100  98 - 111 mmol/L Final   CO2 05/08/2022 27  22 - 32 mmol/L Final   Glucose, Bld 05/08/2022 93  70 - 99 mg/dL Final   Glucose reference range applies only to samples taken after fasting for at least 8 hours.   BUN 05/08/2022 8  6 - 20 mg/dL Final   Creatinine, Ser 05/08/2022 0.68  0.44 - 1.00 mg/dL Final   Calcium 28/31/5176 9.0  8.9 - 10.3 mg/dL Final   Total Protein 16/02/3709 6.6  6.5 - 8.1 g/dL Final   Albumin 62/69/4854 3.7  3.5 - 5.0 g/dL Final   AST 62/70/3500 17  15 - 41 U/L Final   ALT 05/08/2022 17  0 - 44 U/L Final   Alkaline Phosphatase 05/08/2022 111  38 - 126 U/L Final   Total Bilirubin 05/08/2022 0.6  0.3 - 1.2 mg/dL Final   GFR, Estimated 05/08/2022 >60  >60 mL/min Final   Comment: (NOTE) Calculated using the CKD-EPI Creatinine Equation (2021)     Anion gap 05/08/2022 8  5 - 15 Final   Performed at [facility], [address], [place], [place] [address]   Hgb A1c MFr Bld 05/08/2022 4.9  4.8 - 5.6 % Final   Comment: (NOTE) Pre diabetes:          5.7%-6.4%  Diabetes:              >6.4%  Glycemic control for   <7.0% adults with diabetes    Mean Plasma Glucose 05/08/2022 93.93  mg/dL Final   Performed at [facility], [address] [place], [place] [address]   Preg Test, Ur 05/08/2022 NEGATIVE  NEGATIVE Final   Performed at [facility], [address], [place], [place] [address]   POC Amphetamine UR 05/08/2022 None Detected  NONE DETECTED (Cut Off Level 1000 ng/mL) Final   POC Secobarbital (BAR) 05/08/2022 None Detected  NONE DETECTED (Cut Off Level 300 ng/mL) Final   POC Buprenorphine (BUP) 05/08/2022 None Detected  NONE DETECTED (Cut Off Level 10 ng/mL) Final   POC Oxazepam (BZO) 05/08/2022 None Detected  NONE DETECTED (Cut Off Level 300 ng/mL) Final   POC Cocaine UR 05/08/2022 None Detected  NONE DETECTED (Cut Off Level 300 ng/mL) Final   POC Methamphetamine UR 05/08/2022 Positive (A)  NONE DETECTED (Cut Off Level 1000 ng/mL) Final   POC Morphine 05/08/2022 None Detected  NONE DETECTED (Cut Off Level 300 ng/mL) Final   POC Methadone UR 05/08/2022 None Detected  NONE DETECTED (Cut Off Level 300 ng/mL) Final   POC Oxycodone UR 05/08/2022 None Detected  NONE DETECTED (Cut Off Level 100 ng/mL) Final   POC Marijuana UR 05/08/2022 None Detected  NONE DETECTED (Cut Off Level 50 ng/mL) Final   SARSCOV2ONAVIRUS 2 AG 05/08/2022 NEGATIVE  NEGATIVE Final   Comment: (NOTE) SARS-CoV-2 antigen NOT DETECTED.   Negative results are presumptive.  Negative results do not preclude SARS-CoV-2 infection and should not be used as the sole basis for treatment or other patient management decisions, including infection  control decisions, particularly in the presence of clinical signs and  symptoms consistent with COVID-19, or  in those who have been in contact with the virus.  Negative results must be combined with clinical observations, patient history, and epidemiological information. The expected result is Negative.  Fact Sheet for Patients: https://www.jennings-kim.com/  Fact Sheet for Healthcare Providers: https://alexander-rogers.biz/  This test is not yet approved or cleared by the [place] FDA and  has been authorized for detection and/or diagnosis of SARS-CoV-2 by FDA under an Emergency Use Authorization (EUA).  This EUA will remain in effect (meaning this test can be used) for the duration of  the COV                          ID-19 declaration under Section 564(b)(1) of the Act, 21 U.S.C. section 360bbb-3(b)(1), unless the authorization is terminated or revoked sooner.     Preg Test, Ur 05/08/2022 NEGATIVE  NEGATIVE Final   Comment:        THE SENSITIVITY OF THIS METHODOLOGY IS >24 mIU/mL    Cholesterol 05/08/2022 187  0 - 200 mg/dL Final   Triglycerides 60/45/4098 117  <150 mg/dL Final   HDL 11/91/4782 67  >40 mg/dL Final   Total CHOL/HDL Ratio 05/08/2022 2.8  RATIO Final   VLDL 05/08/2022 23  0 - 40 mg/dL Final   LDL Cholesterol 05/08/2022 97  0 - 99 mg/dL Final   Comment:        Total Cholesterol/HDL:CHD Risk Coronary Heart Disease Risk Table                     Men   Women  1/2 Average Risk   3.4   3.3  Average Risk       5.0   4.4  2 X Average Risk   9.6   7.1  3 X Average Risk  23.4   11.0        Use the calculated Patient Ratio above and the CHD Risk Table to determine the patient's CHD Risk.        ATP III CLASSIFICATION (LDL):  <  100     mg/dL   Optimal  161-096  mg/dL   Near or Above                    Optimal  130-159  mg/dL   Borderline  045-409  mg/dL   High  >811     mg/dL   Very High Performed at [facility], [address], [place], [place] [address]    TSH 05/08/2022 3.264  0.350 - 4.500 uIU/mL Final   Comment: Performed by  a 3rd Generation assay with a functional sensitivity of <=0.01 uIU/mL. Performed at [facility], [address], [place], [place] [address]   Admission on 03/20/2022, Discharged on 03/20/2022  Component Date Value Ref Range Status   WBC 03/20/2022 9.3  4.0 - 10.5 K/uL Final   RBC 03/20/2022 4.76  3.87 - 5.11 MIL/uL Final   Hemoglobin 03/20/2022 14.2  12.0 - 15.0 g/dL Final   HCT 13/03/6577 44.0  36.0 - 46.0 % Final   MCV 03/20/2022 92.4  80.0 - 100.0 fL Final   MCH 03/20/2022 29.8  26.0 - 34.0 pg Final   MCHC 03/20/2022 32.3  30.0 - 36.0 g/dL Final   RDW 46/96/2952 12.4  11.5 - 15.5 % Final   Platelets 03/20/2022 286  150 - 400 K/uL Final   nRBC 03/20/2022 0.0  0.0 - 0.2 % Final   Neutrophils Relative % 03/20/2022 56  % Final   Neutro Abs 03/20/2022 5.3  1.7 - 7.7 K/uL Final   Lymphocytes Relative 03/20/2022 32  % Final   Lymphs Abs 03/20/2022 3.0  0.7 - 4.0 K/uL Final   Monocytes Relative 03/20/2022 8  % Final   Monocytes Absolute 03/20/2022 0.7  0.1 - 1.0 K/uL Final   Eosinophils Relative 03/20/2022 2  % Final   Eosinophils Absolute 03/20/2022 0.2  0.0 - 0.5 K/uL Final   Basophils Relative 03/20/2022 1  % Final   Basophils Absolute 03/20/2022 0.1  0.0 - 0.1 K/uL Final   Immature Granulocytes 03/20/2022 1  % Final   Abs Immature Granulocytes 03/20/2022 0.06  0.00 - 0.07 K/uL Final   Performed at [facility], [address] [place], [place] [address]   Sodium 03/20/2022 140  135 - 145 mmol/L Final   Potassium 03/20/2022 4.2  3.5 - 5.1 mmol/L Final   Chloride 03/20/2022 106  98 - 111 mmol/L Final   CO2 03/20/2022 25  22 - 32 mmol/L Final   Glucose, Bld 03/20/2022 89  70 - 99 mg/dL Final   Glucose reference range applies only to samples taken after fasting for at least 8 hours.   BUN 03/20/2022 17  6 - 20 mg/dL Final   Creatinine, Ser 03/20/2022 0.64  0.44 - 1.00 mg/dL Final   Calcium 44/08/270 9.2  8.9 - 10.3 mg/dL Final   Total Protein 53/66/4403 7.1  6.5 - 8.1  g/dL Final   Albumin 47/42/5956 4.1  3.5 - 5.0 g/dL Final   AST 38/75/6433 18  15 - 41 U/L Final   ALT 03/20/2022 26  0 - 44 U/L Final   Alkaline Phosphatase 03/20/2022 86  38 - 126 U/L Final   Total Bilirubin 03/20/2022 0.4  0.3 - 1.2 mg/dL Final   GFR, Estimated 03/20/2022 >60  >60 mL/min Final   Comment: (NOTE) Calculated using the CKD-EPI Creatinine Equation (2021)    Anion gap 03/20/2022 9  5 - 15 Final   Performed at  [facility], [address] [place], [place] [address]   Color, Urine 03/20/2022 STRAW (A)  YELLOW Final   APPearance 03/20/2022 CLEAR  CLEAR Final   Specific Gravity, Urine 03/20/2022 1.008  1.005 - 1.030 Final   pH 03/20/2022 6.0  5.0 - 8.0 Final   Glucose, UA 03/20/2022 NEGATIVE  NEGATIVE mg/dL Final   Hgb urine dipstick 03/20/2022 NEGATIVE  NEGATIVE Final   Bilirubin Urine 03/20/2022 NEGATIVE  NEGATIVE Final   Ketones, ur 03/20/2022 NEGATIVE  NEGATIVE mg/dL Final   Protein, ur 98/06/9146 NEGATIVE  NEGATIVE mg/dL Final   Nitrite 82/95/6213 NEGATIVE  NEGATIVE Final   Leukocytes,Ua 03/20/2022 NEGATIVE  NEGATIVE Final   Performed at [facility], [address], [place], [place] [address]  Admission on 03/10/2022, Discharged on 03/20/2022  Component Date Value Ref Range Status   Magnesium 03/11/2022 1.9  1.7 - 2.4 mg/dL Final   Performed at [facility], [address] [place], [place] [address]   Hepatitis B Surface Ag 03/11/2022 NON REACTIVE  NON REACTIVE Final   HCV Ab 03/11/2022 NON REACTIVE  NON REACTIVE Final   Comment: (NOTE) Nonreactive HCV antibody screen is consistent with no HCV infections,  unless recent infection is suspected or other evidence exists to indicate HCV infection.     Hep A IgM 03/11/2022 NON REACTIVE  NON REACTIVE Final   Hep B C IgM 03/11/2022 NON REACTIVE  NON REACTIVE Final   Performed at [facility], [address], [place], [place] [address]   Vit D, 25-Hydroxy 03/11/2022 31.00  30 - 100  ng/mL Final   Comment: (NOTE) Vitamin D deficiency has been defined by the Institute of Medicine  and an Endocrine Society practice guideline as a level of serum 25-OH  vitamin D less than 20 ng/mL (1,2). The Endocrine Society went on to  further define vitamin D insufficiency as a level between 21 and 29  ng/mL (2).  1. IOM (Institute of Medicine). 2010. Dietary reference intakes for  calcium and D. [place]: [employer]. 2. Holick MF, Binkley Bufalo, Bischoff-Ferrari HA, et al. Evaluation,  treatment, and prevention of vitamin D deficiency: an Endocrine  Society clinical practice guideline, JCEM. 2011 Jul; 96(7): 1911-30.  Performed at [facility], [address] [place], [place] [address]    Vitamin B-12 03/11/2022 201  180 - 914 pg/mL Final   Comment: (NOTE) This assay is not validated for testing neonatal or myeloproliferative syndrome specimens for Vitamin B12 levels. Performed at [facility], [address] [place], [place] [address]    TSH 03/11/2022 3.487  0.350 - 4.500 uIU/mL Final   Comment: Performed by a 3rd Generation assay with a functional sensitivity of <=0.01 uIU/mL. Performed at [facility], [address], [place], [place] [address]    RPR Ser Ql 03/11/2022 NON REACTIVE  NON REACTIVE Final   Performed at [facility], [address], [place], [place] [address]   Cholesterol 03/13/2022 194  0 - 200 mg/dL Final   Triglycerides 47/42/5956 53  <150 mg/dL Final   HDL 38/75/6433 54  >40 mg/dL Final   Total CHOL/HDL Ratio 03/13/2022 3.6  RATIO Final   VLDL 03/13/2022 11  0 - 40 mg/dL Final   LDL Cholesterol 03/13/2022 129 (H)  0 - 99 mg/dL Final   Comment:        Total Cholesterol/HDL:CHD Risk Coronary Heart Disease Risk Table  Men   Women  1/2 Average Risk   3.4   3.3  Average Risk       5.0   4.4  2 X Average Risk   9.6   7.1  3 X Average Risk  23.4   11.0        Use  the calculated Patient Ratio above and the CHD Risk Table to determine the patient's CHD Risk.        ATP III CLASSIFICATION (LDL):  <100     mg/dL   Optimal  443-154  mg/dL   Near or Above                    Optimal  130-159  mg/dL   Borderline  008-676  mg/dL   High  >195     mg/dL   Very High Performed at [facility], [address], [place], [place] [address]    Hgb A1c MFr Bld 03/13/2022 5.1  4.8 - 5.6 % Final   Comment: (NOTE) Pre diabetes:          5.7%-6.4%  Diabetes:              >6.4%  Glycemic control for   <7.0% adults with diabetes    Mean Plasma Glucose 03/13/2022 99.67  mg/dL Final   Performed at [facility], [address] [place], [place] [address]   Sodium 03/14/2022 138  135 - 145 mmol/L Final   Potassium 03/14/2022 4.3  3.5 - 5.1 mmol/L Final   Chloride 03/14/2022 107  98 - 111 mmol/L Final   CO2 03/14/2022 26  22 - 32 mmol/L Final   Glucose, Bld 03/14/2022 89  70 - 99 mg/dL Final   Glucose reference range applies only to samples taken after fasting for at least 8 hours.   BUN 03/14/2022 17  6 - 20 mg/dL Final   Creatinine, Ser 03/14/2022 0.65  0.44 - 1.00 mg/dL Final   Calcium 80/99/8338 9.1  8.9 - 10.3 mg/dL Final   Total Protein 25/12/3974 6.5  6.5 - 8.1 g/dL Final   Albumin 73/41/9379 3.6  3.5 - 5.0 g/dL Final   AST 02/40/9735 14 (L)  15 - 41 U/L Final   ALT 03/14/2022 15  0 - 44 U/L Final   Alkaline Phosphatase 03/14/2022 71  38 - 126 U/L Final   Total Bilirubin 03/14/2022 0.3  0.3 - 1.2 mg/dL Final   GFR, Estimated 03/14/2022 >60  >60 mL/min Final   Comment: (NOTE) Calculated using the CKD-EPI Creatinine Equation (2021)    Anion gap 03/14/2022 5  5 - 15 Final   Performed at [facility], [address] [place], [place] [address]   Lipase 03/14/2022 32  11 - 51 U/L Final   Performed at [facility], [address] [place], [place] [address]   WBC 03/14/2022 8.8  4.0 - 10.5 K/uL  Final   RBC 03/14/2022 4.37  3.87 - 5.11 MIL/uL Final   Hemoglobin 03/14/2022 13.2  12.0 - 15.0 g/dL Final   HCT 41/96/2229 39.9  36.0 - 46.0 % Final   MCV 03/14/2022 91.3  80.0 - 100.0 fL Final   MCH 03/14/2022 30.2  26.0 - 34.0 pg Final   MCHC 03/14/2022 33.1  30.0 - 36.0 g/dL Final   RDW 79/89/2119 12.4  11.5 - 15.5 % Final   Platelets 03/14/2022 230  150 - 400 K/uL Final   nRBC 03/14/2022 0.0  0.0 - 0.2 % Final   Neutrophils Relative % 03/14/2022 52  % Final   Neutro Abs 03/14/2022 4.7  1.7 - 7.7 K/uL Final   Lymphocytes Relative 03/14/2022 36  % Final   Lymphs Abs 03/14/2022 3.1  0.7 - 4.0 K/uL Final   Monocytes Relative 03/14/2022 9  % Final   Monocytes Absolute 03/14/2022 0.8  0.1 - 1.0 K/uL Final   Eosinophils Relative 03/14/2022 2  % Final   Eosinophils Absolute 03/14/2022 0.2  0.0 - 0.5 K/uL Final   Basophils Relative 03/14/2022 0  % Final   Basophils Absolute 03/14/2022 0.0  0.0 - 0.1 K/uL Final   Immature Granulocytes 03/14/2022 1  % Final   Abs Immature Granulocytes 03/14/2022 0.04  0.00 - 0.07 K/uL Final   Performed at [facility], [address] [place], [place] [address]   Color, Urine 03/14/2022 YELLOW  YELLOW Final   APPearance 03/14/2022 HAZY (A)  CLEAR Final   Specific Gravity, Urine 03/14/2022 1.012  1.005 - 1.030 Final   pH 03/14/2022 6.0  5.0 - 8.0 Final   Glucose, UA 03/14/2022 NEGATIVE  NEGATIVE mg/dL Final   Hgb urine dipstick 03/14/2022 NEGATIVE  NEGATIVE Final   Bilirubin Urine 03/14/2022 NEGATIVE  NEGATIVE Final   Ketones, ur 03/14/2022 NEGATIVE  NEGATIVE mg/dL Final   Protein, ur 60/45/4098 NEGATIVE  NEGATIVE mg/dL Final   Nitrite 11/91/4782 NEGATIVE  NEGATIVE Final   Leukocytes,Ua 03/14/2022 LARGE (A)  NEGATIVE Final   RBC / HPF 03/14/2022 0-5  0 - 5 RBC/hpf Final   WBC, UA 03/14/2022 21-50  0 - 5 WBC/hpf Final   Bacteria, UA 03/14/2022 RARE (A)  NONE SEEN Final   Squamous Epithelial / LPF 03/14/2022 0-5  0 - 5 Final    Performed at [facility], [address] [place], [place] [address]   Preg Test, Ur 03/14/2022 NEGATIVE  NEGATIVE Final   Comment:        THE SENSITIVITY OF THIS METHODOLOGY IS >20 mIU/mL. Performed at [facility], [address], [place], [place] [address]    Specimen Description 03/14/2022    Final                   Value:URINE, CLEAN CATCH Performed at [facility], [address] [place], [place] [address]    Special Requests 03/14/2022    Final                   Value:NONE Performed at [facility], [address] [place], [place] [address]    Culture 03/14/2022 MULTIPLE SPECIES PRESENT, SUGGEST RECOLLECTION (A)   Final   Report Status 03/14/2022 03/16/2022 FINAL   Final  There may be more visits with results that are not included.    Blood Alcohol level:  Lab Results  Component Value Date   ETH 37 (H) 07/17/2022   ETH 18 (H) 06/06/2022    Metabolic Disorder Labs: Lab Results  Component Value Date   HGBA1C 4.9 05/08/2022   MPG 93.93 05/08/2022   MPG 99.67 03/13/2022   No results found for: "PROLACTIN" Lab Results  Component Value Date   CHOL 187 05/08/2022   TRIG 117 05/08/2022   HDL 67 05/08/2022   CHOLHDL 2.8 05/08/2022   VLDL 23 05/08/2022   LDLCALC 97 05/08/2022   LDLCALC 129 (H) 03/13/2022    Therapeutic Lab Levels: No results found for: "LITHIUM" No results found for: "VALPROATE" No  results found for: "CBMZ"  Physical Findings   AIMS    Flowsheet Row ED to [facility]osp-Ad[facility] (Dis[facility]  AIMS Total Score 0      AUDIT    Flowsheet Row ED to Hosp-Admission (Discharged) [facility]  Alcohol Use Disorder Identification Test Final Score (AUDIT) 32      CAGE-AID    Flowsheet Row ED from 01/17/2022 in [facility]  CAGE-AID  Score 1      GAD-7    Flowsheet Row Office Visit from 03/12/2021 in [facility]  Total GAD-7 Score 20      PHQ2-9    Flowsheet Row ED from 07/17/2022 in [facility] ED from 05/08/2022 in [facility] Office Visit from 03/12/2021 in [facility]  PHQ-2 Total Score 6 2 5   PHQ-9 Total Score 19 11 22       Flowsheet Row ED from 07/17/2022 in [facility] Most recent reading at 07/17/2022  6:11 PM ED from 07/15/2022 in [facility] Most recent reading at 07/15/2022  8:29 AM Admission (Pending) from OP [facility] Most recent reading at 07/15/2022 12:36 AM  C-SSRS RISK CATEGORY No Risk No Risk No Risk        Musculoskeletal  Strength & Muscle Tone: within normal limits Gait & Station:  Not observed Patient leans: N/A  Psychiatric Specialty Exam  Presentation  General Appearance:  Appropriate for Environment  Eye Contact: Fair  Speech: Clear and Coherent  Speech Volume: Normal  Handedness: Right   Mood and Affect  Mood: Depressed, "Tired"  Affect: Congruent   Thought Process  Thought Processes: Coherent  Descriptions of Associations:Intact  Orientation:Full (Time, Place and Person)  Thought Content:Logical  Diagnosis of Schizophrenia or Schizoaffective disorder in past: No    Hallucinations:Hallucinations: None  Ideas of Reference:None  Suicidal Thoughts:Suicidal Thoughts: No  Homicidal Thoughts:Homicidal Thoughts: No   Sensorium  Memory: Immediate Fair; Recent Fair; Remote Fair  Judgment: Intact  Insight: Present   Executive Functions  Concentration: Fair  Attention Span: Fair  Recall: 14/07/2022 of Knowledge: Fair  Language: Fair   Psychomotor Activity  Psychomotor Activity: Psychomotor Activity: Normal   Assets   Assets: Communication Skills; Desire for Improvement; Leisure Time; Physical Health; Resilience   Sleep  Sleep: Sleep: Fair   Nutritional Assessment (For OBS and [facility] admissions only) Has the patient had a weight loss or gain of 10 pounds or more in the last 3 months?: Yes Has the patient had a decrease in food intake/or appetite?: No Does the patient have dental problems?: No Does the patient have eating habits or behaviors that may be indicators of an eating disorder including binging or inducing vomiting?: No Has the patient recently lost weight without trying?: 2.0 Has the patient been eating poorly because of a decreased appetite?: 0 Malnutrition Screening Tool Score: 2 Nutritional Assessment Referrals: Medication/Tx changes    Physical Exam  Constitutional:      Appearance: the patient is not toxic-appearing and lying in bed.  Pulmonary:     Effort: Pulmonary effort is normal.  Neurological:     General: No focal deficit present.     Mental Status: the patient is alert and answering questions appropriately  Review of Systems  Respiratory:  Negative for shortness of breath.  Cardiovascular:  Negative for chest pain.  Gastrointestinal:  Negative for abdominal pain, constipation. Positive for diarrhea, nausea. Neurological:  Negative for headaches.    Blood pressure 112/83, pulse 71, temperature 97.8 F (36.6 C), temperature source Oral, resp. rate 18, height  (1.6 m), weight 110 lb (49.9 kg), SpO2 100 %. Body mass index is 19.49 kg/m.  Treatment Plan Summary: Daily contact with patient to assess and evaluate symptoms and progress in treatment, medication management. Patient continued on ativan taper for alcohol withdrawal symptoms. Patient also noted to have chlamydia infection, this was discussed with patient and treatment was started. Patient continues to report symptoms consistent with MDD, appears she was previously prescribed lexapro , abilify 15,  gabapentin  BID. Would consider restarting these and continuing to reassess her depressive symptoms as she finishes up her withdrawal taper.   #Alcohol use disorder -Continue ativan taper for alcohol withdrawal -Continue CIWA -Continue PRNs:  Vistaril 25 mg po 3 times daily prn for anxiety Zofran 4 mg every 6 hours as needed for nausea or vomiting Imodium 2-4 mg po as needed for diarrhea  Thiamine 100 mg p.o. daily for nutritional supplementation Multivitamin 1 tablet by mouth daily for nutritional supplementation  #Chlamydia infection Patient informed of positive chlamydia test, treatment, and need for follow-up after treatment. It was also discussed with patient to inform partners of her positive test result and need to get treatment. -START doxycycline  BID x7 days (12/15-12/22)  Discharge plan: Patient accepted to [facility] on Tuesday.  Karie Fetch, MD 07/18/2022 4:50 PM

## 2022-07-18 NOTE — ED Notes (Signed)
Pt is in the bed sleeping. Respirations are even and unlabored. No acute distress noted. Will continue to monitor for safety. 

## 2022-07-19 DIAGNOSIS — F1911 Other psychoactive substance abuse, in remission: Secondary | ICD-10-CM | POA: Diagnosis not present

## 2022-07-19 DIAGNOSIS — F10230 Alcohol dependence with withdrawal, uncomplicated: Secondary | ICD-10-CM | POA: Diagnosis not present

## 2022-07-19 DIAGNOSIS — Z1152 Encounter for screening for COVID-19: Secondary | ICD-10-CM | POA: Diagnosis not present

## 2022-07-19 MED ORDER — ARIPIPRAZOLE 5 MG PO TABS
5.0000 mg | ORAL_TABLET | Freq: Once | ORAL | Status: AC
  Administered 2022-07-19: 5 mg via ORAL
  Filled 2022-07-19: qty 1

## 2022-07-19 MED ORDER — ARIPIPRAZOLE 5 MG PO TABS
5.0000 mg | ORAL_TABLET | Freq: Every day | ORAL | Status: DC
  Administered 2022-07-20 – 2022-07-21 (×2): 5 mg via ORAL
  Filled 2022-07-19 (×2): qty 1

## 2022-07-19 MED ORDER — ESCITALOPRAM OXALATE 10 MG PO TABS
10.0000 mg | ORAL_TABLET | Freq: Once | ORAL | Status: AC
  Administered 2022-07-19: 10 mg via ORAL
  Filled 2022-07-19: qty 1

## 2022-07-19 NOTE — ED Notes (Signed)
Notified patient breakfast is been served 

## 2022-07-19 NOTE — ED Notes (Signed)
Pt sleeping in no acute distress. RR even and unlabored. Environment secured. Will continue to monitor for safety. 

## 2022-07-19 NOTE — ED Provider Notes (Signed)
Behavioral Health Progress Note  Date and Time: 07/19/2022 10:59 AM Name: Kristina Owens MRN:  [id number]  Subjective:  Kristina Owens is a 56 year old female patient with a documented past psychiatric history of polysubstance abuse, cocaine use disorder, alcohol use disorder, methamphetamine abuse, GAD, MDD, and PTSD requesting alcohol detox.   Patient assessment today patient reports that she is feeling depressed.  Patient reports that "I do not feel well" endorsing that this is because she has low energy.  Patient reports that she believes there is feelings of depression stem from feeling alone, having a family, and overall feeling as though there is "nothing to be happy about."  Patient reports her concentration is poor and that a command auditory hallucination she is having are often familiar voices of people in her family, "but that does not make sense."  Patient reports that these voices will often tell her what to do and where to go.  Patient denies having any visual hallucinations.  Patient also denies SI or HI.  Patient reports that she is having some cravings for alcohol and endorses that she usually drinks because she does not want to think about her family members no longer being around.  Patient reports that she is having some headaches, nausea and diarrhea but denies any vomiting.  Patient and provider briefly discussed during screening for bipolar disorder, the patient has never had a true manic episode.  Patient reports that the only time she feels that she has a symptom increase in energy is when she is intoxicated.  Patient reports that she has had Lexapro and Abilify in the past, but she found the Abilify with the Lexapro was more beneficial than Lexapro as monotherapy.  Patient reports "it made me feel more balanced."  Diagnosis:  Final diagnoses:  Alcohol-induced mood disorder ([place])  Alcohol dependence with uncomplicated withdrawal ([place])    Total Time spent with patient:  20 minutes  Past Psychiatric History: polysubstance abuse, cocaine use disorder, alcohol use disorder, methamphetamine abuse, GAD, MDD, and PTSD.   Past Medical History:  Past Medical History:  Diagnosis Date   Anxiety    Depression    DVT (deep vein thrombosis) in pregnancy    GERD (gastroesophageal reflux disease)    Ovarian cyst     Past Surgical History:  Procedure Laterality Date   APPENDECTOMY  age 16   IR PTA VENOUS EXCEPT DIALYSIS CIRCUIT  01/16/2021   IR RADIOLOGIST EVAL & MGMT  03/19/2021   IR THROMBECT VENO MECH MOD SED  01/16/2021   IR US GUIDE VASC ACCESS LEFT  01/16/2021   IR VENO/EXT/UNI LEFT  01/16/2021   IR VENOCAVAGRAM IVC  01/16/2021   TONSILLECTOMY Bilateral age 60   Family History:  Family History  Problem Relation Age of Onset   Asthma Mother    COPD Mother    Cancer Father        thyroid cancer   Family Psychiatric  History: family psychiatric history of deceased father was an alcoholic, deceased mother had a history of depression, and sister has a history of depression.   Social History:  Social History   Substance and Sexual Activity  Alcohol Use Yes     Social History   Substance and Sexual Activity  Drug Use Yes   Types: "Crack" cocaine, Cocaine, Methamphetamines   Comment: last used 4/31/22    Social History   Socioeconomic History   Marital status: Divorced    Spouse name: Not on file   Number of children:  Not on file   Years of education: Not on file   Highest education level: Not on file  Occupational History   Not on file  Tobacco Use   Smoking status: Every Day    Packs/day: 0.50    Years: 35.00    Total pack years: 17.50    Types: Cigarettes   Smokeless tobacco: Never  Vaping Use   Vaping Use: Never used  Substance and Sexual Activity   Alcohol use: Yes   Drug use: Yes    Types: "Crack" cocaine, Cocaine, Methamphetamines    Comment: last used 4/31/22   Sexual activity: Not on file  Other Topics Concern   Not on file   Social History Narrative   Not on file   Social Determinants of Health   Financial Resource Strain: Not on file  Food Insecurity: Not on file  Transportation Needs: Not on file  Physical Activity: Not on file  Stress: Not on file  Social Connections: Not on file   SDOH:  SDOH Screenings   Alcohol Screen: High Risk (03/10/2022)  Depression (PHQ2-9): High Risk (07/17/2022)  Tobacco Use: High Risk (07/14/2022)   Additional Social History:    Pain Medications: See MAR Prescriptions: See MAR Over the Counter: See MAR History of alcohol / drug use?: Yes Longest period of sobriety (when/how long): 2 months Negative Consequences of Use: Legal Withdrawal Symptoms: Nausea / Vomiting, Tremors, Fever / Chills, Sweats, Other (Comment) (Headache) Name of Substance 1: Alcohol 1 - Age of First Use: 16 1 - Amount (size/oz): four 4LOCOS 1 - Duration: ongoing 1 - Last Use / Amount: [place] today 1 - Method of Aquiring: Stealing 1- Route of Use: Oral Name of Substance 2: Methamohetamine 2 - Age of First Use: 89 2 - Amount (size/oz): $20 worth 2 - Frequency: Weekly 2 - Duration: Ongoing 2 - Last Use / Amount: 5 days ago 2 - Method of Aquiring: Purchases off the street 2 - Route of Substance Use: oral                Sleep: Fair  Appetite:  Fair  Current Medications:  Current Facility-Administered Medications  Medication Dose Route Frequency Provider Last Rate Last Admin   acetaminophen (TYLENOL) tablet 650 mg  650 mg Oral Q6H PRN White, Patrice L, NP       alum & mag hydroxide-simeth (MAALOX/MYLANTA) 200-200-20 MG/5ML suspension 30 mL  30 mL Oral Q4H PRN White, Patrice L, NP       ARIPiprazole (ABILIFY) tablet 5 mg  5 mg Oral Once Damita Dunnings B, MD       doxycycline (VIBRA-TABS) tablet 100 mg  100 mg Oral Q12H Rolanda Lundborg, MD   100 mg at 07/19/22 0925   escitalopram (LEXAPRO) tablet 10 mg  10 mg Oral Once Damita Dunnings B, MD       hydrOXYzine (ATARAX) tablet 25  mg  25 mg Oral TID PRN White, Patrice L, NP   25 mg at 07/17/22 1826   loperamide (IMODIUM) capsule 2-4 mg  2-4 mg Oral PRN White, Patrice L, NP   2 mg at 07/17/22 2133   LORazepam (ATIVAN) tablet 1 mg  1 mg Oral Q6H PRN White, Patrice L, NP       LORazepam (ATIVAN) tablet 1 mg  1 mg Oral BID White, Patrice L, NP       Followed by   Derrill Memo ON 07/21/2022] LORazepam (ATIVAN) tablet 1 mg  1 mg Oral Daily Tribbey, Patrice  L, NP       magnesium hydroxide (MILK OF MAGNESIA) suspension 30 mL  30 mL Oral Daily PRN White, Patrice L, NP       multivitamin with minerals tablet 1 tablet  1 tablet Oral Daily White, Patrice L, NP   1 tablet at 07/19/22 0925   ondansetron (ZOFRAN-ODT) disintegrating tablet 4 mg  4 mg Oral Q6H PRN White, Patrice L, NP   4 mg at 07/17/22 2134   thiamine (VITAMIN B1) injection 100 mg  100 mg Intramuscular Once White, Patrice L, NP       thiamine (VITAMIN B1) tablet 100 mg  100 mg Oral Daily White, Patrice L, NP   100 mg at 07/19/22 [id number]   Current Outpatient Medications  Medication Sig Dispense Refill   ARIPiprazole (ABILIFY) 15 MG tablet Take 1 tablet (15 mg total) by mouth daily. 30 tablet 0   escitalopram (LEXAPRO) 5 MG tablet Take 3 tablets (15 mg total) by mouth daily. 90 tablet 0   gabapentin (NEURONTIN) 100 MG capsule Take 2 capsules (200 mg total) by mouth 3 (three) times daily. 180 capsule 0   ibuprofen (ADVIL) 800 MG tablet Take 1 tablet (800 mg total) by mouth 3 (three) times daily. 21 tablet 0   Multiple Vitamin (MULTIVITAMIN WITH MINERALS) TABS tablet Take 1 tablet by mouth daily.     ondansetron (ZOFRAN) 4 MG tablet Take 1 tablet (4 mg total) by mouth every 6 (six) hours. 12 tablet 0   rivaroxaban (XARELTO) 20 MG TABS tablet Take 1 tablet (20 mg total) by mouth daily with supper. 30 tablet 0   traZODone (DESYREL) 50 MG tablet Take 1 tablet (50 mg total) by mouth at bedtime as needed for sleep. 30 tablet 0   cephALEXin (KEFLEX) 500 MG capsule Take 1 capsule (500 mg  total) by mouth 4 (four) times daily. (Patient not taking: Reported on 07/11/2022) 20 capsule 0    Labs  Lab Results:  Admission on 07/17/2022  Component Date Value Ref Range Status   SARS Coronavirus 2 by RT PCR 07/17/2022 NEGATIVE  NEGATIVE Final   Comment: (NOTE) SARS-CoV-2 target nucleic acids are NOT DETECTED.  The SARS-CoV-2 RNA is generally detectable in upper respiratory specimens during the acute phase of infection. The lowest concentration of SARS-CoV-2 viral copies this assay can detect is 138 copies/mL. A negative result does not preclude SARS-Cov-2 infection and should not be used as the sole basis for treatment or other patient management decisions. A negative result may occur with  improper specimen collection/handling, submission of specimen other than nasopharyngeal swab, presence of viral mutation(s) within the areas targeted by this assay, and inadequate number of viral copies(<138 copies/mL). A negative result must be combined with clinical observations, patient history, and epidemiological information. The expected result is Negative.  Fact Sheet for Patients:  EntrepreneurPulse.com.au  Fact Sheet for Healthcare Providers:  IncredibleEmployment.be  This test is no                          t yet approved or cleared by the [place] FDA and  has been authorized for detection and/or diagnosis of SARS-CoV-2 by FDA under an Emergency Use Authorization (EUA). This EUA will remain  in effect (meaning this test can be used) for the duration of the COVID-19 declaration under Section 564(b)(1) of the Act, 21 U.S.C.section 360bbb-3(b)(1), unless the authorization is terminated  or revoked sooner.       Influenza  A by PCR 07/17/2022 NEGATIVE  NEGATIVE Final   Influenza B by PCR 07/17/2022 NEGATIVE  NEGATIVE Final   Comment: (NOTE) The Xpert Xpress SARS-CoV-2/FLU/RSV plus assay is intended as an aid in the diagnosis of  influenza from Nasopharyngeal swab specimens and should not be used as a sole basis for treatment. Nasal washings and aspirates are unacceptable for Xpert Xpress SARS-CoV-2/FLU/RSV testing.  Fact Sheet for Patients: EntrepreneurPulse.com.au  Fact Sheet for Healthcare Providers: IncredibleEmployment.be  This test is not yet approved or cleared by the [place] FDA and has been authorized for detection and/or diagnosis of SARS-CoV-2 by FDA under an Emergency Use Authorization (EUA). This EUA will remain in effect (meaning this test can be used) for the duration of the COVID-19 declaration under Section 564(b)(1) of the Act, 21 U.S.C. section 360bbb-3(b)(1), unless the authorization is terminated or revoked.     Resp Syncytial Virus by PCR 07/17/2022 NEGATIVE  NEGATIVE Final   Comment: (NOTE) Fact Sheet for Patients: EntrepreneurPulse.com.au  Fact Sheet for Healthcare Providers: IncredibleEmployment.be  This test is not yet approved or cleared by the [place] FDA and has been authorized for detection and/or diagnosis of SARS-CoV-2 by FDA under an Emergency Use Authorization (EUA). This EUA will remain in effect (meaning this test can be used) for the duration of the COVID-19 declaration under Section 564(b)(1) of the Act, 21 U.S.C. section 360bbb-3(b)(1), unless the authorization is terminated or revoked.  Performed at [facility], [place] [address] [place], [place] [address]    WBC 07/17/2022 8.9  4.0 - 10.5 K/uL Final   RBC 07/17/2022 4.64  3.87 - 5.11 MIL/uL Final   Hemoglobin 07/17/2022 14.2  12.0 - 15.0 g/dL Final   HCT 07/17/2022 41.6  36.0 - 46.0 % Final   MCV 07/17/2022 89.7  80.0 - 100.0 fL Final   MCH 07/17/2022 30.6  26.0 - 34.0 pg Final   MCHC 07/17/2022 34.1  30.0 - 36.0 g/dL Final   RDW 07/17/2022 13.7  11.5 - 15.5 % Final   Platelets 07/17/2022 306  150 - 400 K/uL Final    nRBC 07/17/2022 0.0  0.0 - 0.2 % Final   Neutrophils Relative % 07/17/2022 59  % Final   Neutro Abs 07/17/2022 5.2  1.7 - 7.7 K/uL Final   Lymphocytes Relative 07/17/2022 35  % Final   Lymphs Abs 07/17/2022 3.1  0.7 - 4.0 K/uL Final   Monocytes Relative 07/17/2022 5  % Final   Monocytes Absolute 07/17/2022 0.5  0.1 - 1.0 K/uL Final   Eosinophils Relative 07/17/2022 1  % Final   Eosinophils Absolute 07/17/2022 0.1  0.0 - 0.5 K/uL Final   Basophils Relative 07/17/2022 0  % Final   Basophils Absolute 07/17/2022 0.0  0.0 - 0.1 K/uL Final   Immature Granulocytes 07/17/2022 0  % Final   Abs Immature Granulocytes 07/17/2022 0.04  0.00 - 0.07 K/uL Final   Performed at [facility], [place] [address] [place], [place] [address]   Sodium 07/17/2022 139  135 - 145 mmol/L Final   Potassium 07/17/2022 3.7  3.5 - 5.1 mmol/L Final   Chloride 07/17/2022 104  98 - 111 mmol/L Final   CO2 07/17/2022 24  22 - 32 mmol/L Final   Glucose, Bld 07/17/2022 51 (L)  70 - 99 mg/dL Final   Glucose reference range applies only to samples taken after fasting for at least 8 hours.   BUN 07/17/2022 8  6 - 20 mg/dL Final  Creatinine, Ser 07/17/2022 0.69  0.44 - 1.00 mg/dL Final   Calcium 74/25/9563 8.8 (L)  8.9 - 10.3 mg/dL Final   Total Protein 87/56/4332 6.2 (L)  6.5 - 8.1 g/dL Final   Albumin 95/18/8416 3.6  3.5 - 5.0 g/dL Final   AST 60/63/0160 23  15 - 41 U/L Final   ALT 07/17/2022 19  0 - 44 U/L Final   Alkaline Phosphatase 07/17/2022 82  38 - 126 U/L Final   Total Bilirubin 07/17/2022 0.6  0.3 - 1.2 mg/dL Final   GFR, Estimated 07/17/2022 >60  >60 mL/min Final   Comment: (NOTE) Calculated using the CKD-EPI Creatinine Equation (2021)    Anion gap 07/17/2022 11  5 - 15 Final   Performed at [facility], [address], [place], [place] [address]   Alcohol, Ethyl (B) 07/17/2022 37 (H)  <10 mg/dL Final   Comment: (NOTE) Lowest detectable limit for serum alcohol is 10 mg/dL.  For medical purposes  only. Performed at [facility], [address], [place], [place] [address]    Neisseria Gonorrhea 07/17/2022 Negative   Final   Chlamydia 07/17/2022 Positive (A)   Final   Comment 07/17/2022 Normal Reference [place] Chlamydia - Negative   Final   Comment 07/17/2022 Normal Reference Range Neisseria Gonorrhea - Negative   Final   POC Amphetamine UR 07/17/2022 None Detected  NONE DETECTED (Cut Off Level 1000 ng/mL) Final   POC Secobarbital (BAR) 07/17/2022 None Detected  NONE DETECTED (Cut Off Level 300 ng/mL) Final   POC Buprenorphine (BUP) 07/17/2022 None Detected  NONE DETECTED (Cut Off Level 10 ng/mL) Final   POC Oxazepam (BZO) 07/17/2022 Positive (A)  NONE DETECTED (Cut Off Level 300 ng/mL) Final   POC Cocaine UR 07/17/2022 None Detected  NONE DETECTED (Cut Off Level 300 ng/mL) Final   POC Methamphetamine UR 07/17/2022 None Detected  NONE DETECTED (Cut Off Level 1000 ng/mL) Final   POC Morphine 07/17/2022 None Detected  NONE DETECTED (Cut Off Level 300 ng/mL) Final   POC Methadone UR 07/17/2022 None Detected  NONE DETECTED (Cut Off Level 300 ng/mL) Final   POC Oxycodone UR 07/17/2022 None Detected  NONE DETECTED (Cut Off Level 100 ng/mL) Final   POC Marijuana UR 07/17/2022 None Detected  NONE DETECTED (Cut Off Level 50 ng/mL) Final   Preg Test, Ur 07/17/2022 Negative  Negative Final   RPR Ser Ql 07/17/2022 NON REACTIVE  NON REACTIVE Final   Performed at [facility], [address], [place], [place] [address]   HIV Screen 4th Generation wRfx 07/17/2022 Non Reactive  Non Reactive Final   Performed at [facility], [address] [place], [place] [address]   SARSCOV2ONAVIRUS 2 AG 07/17/2022 NEGATIVE  NEGATIVE Final   Comment: (NOTE) SARS-CoV-2 antigen NOT DETECTED.   Negative results are presumptive.  Negative results do not preclude SARS-CoV-2 infection and should not be used as the sole basis for treatment or other patient management decisions, including infection   control decisions, particularly in the presence of clinical signs and  symptoms consistent with COVID-19, or in those who have been in contact with the virus.  Negative results must be combined with clinical observations, patient history, and epidemiological information. The expected result is Negative.  Fact Sheet for Patients: https://www.jennings-kim.com/  Fact Sheet for Healthcare Providers: https://alexander-rogers.biz/  This test is not yet approved or cleared by the [place] FDA and  has been authorized for detection and/or diagnosis of SARS-CoV-2  by FDA under an Emergency Use Authorization (EUA).  This EUA will remain in effect (meaning this test can be used) for the duration of  the COV                          ID-19 declaration under Section 564(b)(1) of the Act, 21 U.S.C. section 360bbb-3(b)(1), unless the authorization is terminated or revoked sooner.     Preg Test, Ur 07/17/2022 NEGATIVE  NEGATIVE Final   Comment:        THE SENSITIVITY OF THIS METHODOLOGY IS >24 mIU/mL   Admission on 07/14/2022, Discharged on 07/14/2022  Component Date Value Ref Range Status   SARS Coronavirus 2 by RT PCR 07/14/2022 NEGATIVE  NEGATIVE Final   Comment: (NOTE) SARS-CoV-2 target nucleic acids are NOT DETECTED.  The SARS-CoV-2 RNA is generally detectable in upper respiratory specimens during the acute phase of infection. The lowest concentration of SARS-CoV-2 viral copies this assay can detect is 138 copies/mL. A negative result does not preclude SARS-Cov-2 infection and should not be used as the sole basis for treatment or other patient management decisions. A negative result may occur with  improper specimen collection/handling, submission of specimen other than nasopharyngeal swab, presence of viral mutation(s) within the areas targeted by this assay, and inadequate number of viral copies(<138 copies/mL). A negative result must be combined  with clinical observations, patient history, and epidemiological information. The expected result is Negative.  Fact Sheet for Patients:  EntrepreneurPulse.com.au  Fact Sheet for Healthcare Providers:  IncredibleEmployment.be  This test is no                          t yet approved or cleared by the [place] FDA and  has been authorized for detection and/or diagnosis of SARS-CoV-2 by FDA under an Emergency Use Authorization (EUA). This EUA will remain  in effect (meaning this test can be used) for the duration of the COVID-19 declaration under Section 564(b)(1) of the Act, 21 U.S.C.section 360bbb-3(b)(1), unless the authorization is terminated  or revoked sooner.       Influenza A by PCR 07/14/2022 NEGATIVE  NEGATIVE Final   Influenza B by PCR 07/14/2022 NEGATIVE  NEGATIVE Final   Comment: (NOTE) The Xpert Xpress SARS-CoV-2/FLU/RSV plus assay is intended as an aid in the diagnosis of influenza from Nasopharyngeal swab specimens and should not be used as a sole basis for treatment. Nasal washings and aspirates are unacceptable for Xpert Xpress SARS-CoV-2/FLU/RSV testing.  Fact Sheet for Patients: EntrepreneurPulse.com.au  Fact Sheet for Healthcare Providers: IncredibleEmployment.be  This test is not yet approved or cleared by the [place] FDA and has been authorized for detection and/or diagnosis of SARS-CoV-2 by FDA under an Emergency Use Authorization (EUA). This EUA will remain in effect (meaning this test can be used) for the duration of the COVID-19 declaration under Section 564(b)(1) of the Act, 21 U.S.C. section 360bbb-3(b)(1), unless the authorization is terminated or revoked.  Performed at [facility], [place] [address] [place], [place] [address]   Admission on 07/11/2022, Discharged on 07/11/2022  Component Date Value Ref Range Status   WBC 07/11/2022 9.7  4.0 -  10.5 K/uL Final   RBC 07/11/2022 5.26 (H)  3.87 - 5.11 MIL/uL Final   Hemoglobin 07/11/2022 15.9 (H)  12.0 - 15.0 g/dL Final   HCT 07/11/2022 48.3 (H)  36.0 - 46.0 % Final   MCV 07/11/2022 91.8  80.0 - 100.0 fL Final   MCH 07/11/2022 30.2  26.0 - 34.0 pg Final   MCHC 07/11/2022 32.9  30.0 - 36.0 g/dL Final   RDW 07/11/2022 13.5  11.5 - 15.5 % Final   Platelets 07/11/2022 282  150 - 400 K/uL Final   nRBC 07/11/2022 0.0  0.0 - 0.2 % Final   Neutrophils Relative % 07/11/2022 63  % Final   Neutro Abs 07/11/2022 6.1  1.7 - 7.7 K/uL Final   Lymphocytes Relative 07/11/2022 29  % Final   Lymphs Abs 07/11/2022 2.8  0.7 - 4.0 K/uL Final   Monocytes Relative 07/11/2022 7  % Final   Monocytes Absolute 07/11/2022 0.7  0.1 - 1.0 K/uL Final   Eosinophils Relative 07/11/2022 1  % Final   Eosinophils Absolute 07/11/2022 0.1  0.0 - 0.5 K/uL Final   Basophils Relative 07/11/2022 0  % Final   Basophils Absolute 07/11/2022 0.0  0.0 - 0.1 K/uL Final   Immature Granulocytes 07/11/2022 0  % Final   Abs Immature Granulocytes 07/11/2022 0.03  0.00 - 0.07 K/uL Final   Performed at [facility], [place] [address], [place], [place] [address]   Sodium 07/11/2022 139  135 - 145 mmol/L Final   Potassium 07/11/2022 3.8  3.5 - 5.1 mmol/L Final   Chloride 07/11/2022 105  98 - 111 mmol/L Final   CO2 07/11/2022 24  22 - 32 mmol/L Final   Glucose, Bld 07/11/2022 90  70 - 99 mg/dL Final   Glucose reference range applies only to samples taken after fasting for at least 8 hours.   BUN 07/11/2022 9  6 - 20 mg/dL Final   Creatinine, Ser 07/11/2022 0.57  0.44 - 1.00 mg/dL Final   Calcium 07/11/2022 8.5 (L)  8.9 - 10.3 mg/dL Final   Total Protein 07/11/2022 6.3 (L)  6.5 - 8.1 g/dL Final   Albumin 07/11/2022 3.6  3.5 - 5.0 g/dL Final   AST 07/11/2022 19  15 - 41 U/L Final   ALT 07/11/2022 16  0 - 44 U/L Final   Alkaline Phosphatase 07/11/2022 78  38 - 126 U/L Final   Total Bilirubin 07/11/2022 0.4  0.3 - 1.2 mg/dL Final    GFR, Estimated 07/11/2022 >60  >60 mL/min Final   Comment: (NOTE) Calculated using the CKD-EPI Creatinine Equation (2021)    Anion gap 07/11/2022 10  5 - 15 Final   Performed at [facility] [address], [place], [place] [address]   Troponin I (High Sensitivity) 07/11/2022 <2  <18 ng/L Final   Comment: (NOTE) Elevated high sensitivity troponin I (hsTnI) values and significant  changes across serial measurements may suggest ACS but many other  chronic and acute conditions are known to elevate hsTnI results.  Refer to the "Links" section for chest pain algorithms and additional  guidance. Performed at [facility], [place] [address] [place], [place] [address]    Lipase 07/11/2022 35  11 - 51 U/L Final   Performed at [facility] [address] [place], [place] [address]   D-Dimer, Quant 07/11/2022 <0.27  0.00 - 0.50 ug/mL-FEU Final   Comment: (NOTE) At the manufacturer cut-off value of 0.5 g/mL FEU, this assay has a negative predictive value of 95-100%.This assay is intended for use in conjunction with a clinical pretest probability (PTP) assessment model to exclude pulmonary embolism (PE) and deep venous thrombosis (DVT) in outpatients suspected of PE or DVT. Results should be  correlated with clinical presentation. Performed at [facility], [address], [place], [place] [address]    Color, Urine 07/11/2022 YELLOW  YELLOW Final   APPearance 07/11/2022 HAZY (A)  CLEAR Final   Specific Gravity, Urine 07/11/2022 1.024  1.005 - 1.030 Final   pH 07/11/2022 6.0  5.0 - 8.0 Final   Glucose, UA 07/11/2022 NEGATIVE  NEGATIVE mg/dL Final   Hgb urine dipstick 07/11/2022 NEGATIVE  NEGATIVE Final   Bilirubin Urine 07/11/2022 NEGATIVE  NEGATIVE Final   Ketones, ur 07/11/2022 NEGATIVE  NEGATIVE mg/dL Final   Protein, ur 09/32/3557 NEGATIVE  NEGATIVE mg/dL Final   Nitrite 32/20/2542 NEGATIVE  NEGATIVE Final   Leukocytes,Ua 07/11/2022 NEGATIVE  NEGATIVE Final    Performed at [facility], [address] [place], [place] [address]   SARS Coronavirus 2 by RT PCR 07/11/2022 NEGATIVE  NEGATIVE Final   Comment: (NOTE) SARS-CoV-2 target nucleic acids are NOT DETECTED.  The SARS-CoV-2 RNA is generally detectable in upper respiratory specimens during the acute phase of infection. The lowest concentration of SARS-CoV-2 viral copies this assay can detect is 138 copies/mL. A negative result does not preclude SARS-Cov-2 infection and should not be used as the sole basis for treatment or other patient management decisions. A negative result may occur with  improper specimen collection/handling, submission of specimen other than nasopharyngeal swab, presence of viral mutation(s) within the areas targeted by this assay, and inadequate number of viral copies(<138 copies/mL). A negative result must be combined with clinical observations, patient history, and epidemiological information. The expected result is Negative.  Fact Sheet for Patients:  BloggerCourse.com  Fact Sheet for Healthcare Providers:  SeriousBroker.it  This test is no                          t yet approved or cleared by the [place] FDA and  has been authorized for detection and/or diagnosis of SARS-CoV-2 by FDA under an Emergency Use Authorization (EUA). This EUA will remain  in effect (meaning this test can be used) for the duration of the COVID-19 declaration under Section 564(b)(1) of the Act, 21 U.S.C.section 360bbb-3(b)(1), unless the authorization is terminated  or revoked sooner.       Influenza A by PCR 07/11/2022 NEGATIVE  NEGATIVE Final   Influenza B by PCR 07/11/2022 NEGATIVE  NEGATIVE Final   Comment: (NOTE) The Xpert Xpress SARS-CoV-2/FLU/RSV plus assay is intended as an aid in the diagnosis of influenza from Nasopharyngeal swab specimens and should not be used as a sole basis for treatment. Nasal washings  and aspirates are unacceptable for Xpert Xpress SARS-CoV-2/FLU/RSV testing.  Fact Sheet for Patients: BloggerCourse.com  Fact Sheet for Healthcare Providers: SeriousBroker.it  This test is not yet approved or cleared by the [place] FDA and has been authorized for detection and/or diagnosis of SARS-CoV-2 by FDA under an Emergency Use Authorization (EUA). This EUA will remain in effect (meaning this test can be used) for the duration of the COVID-19 declaration under Section 564(b)(1) of the Act, 21 U.S.C. section 360bbb-3(b)(1), unless the authorization is terminated or revoked.  Performed at [facility], [address] [place], [place] [address]    I-stat hCG, quantitative 07/11/2022 <5.0  <5 mIU/mL Final   Comment 3 07/11/2022          Final   Comment:   GEST. AGE      CONC.  (mIU/mL)   <=1 WEEK  5 - 50     2 WEEKS       50 - 500     3 WEEKS       100 - 10,000     4 WEEKS     1,000 - 30,000        FEMALE AND NON-PREGNANT FEMALE:     LESS THAN 5 mIU/mL    Troponin I (High Sensitivity) 07/11/2022 2  <18 ng/L Final   Comment: (NOTE) Elevated high sensitivity troponin I (hsTnI) values and significant  changes across serial measurements may suggest ACS but many other  chronic and acute conditions are known to elevate hsTnI results.  Refer to the "Links" section for chest pain algorithms and additional  guidance. Performed at [facility], [place] [address], [place], [place] [address]   Admission on 07/02/2022, Discharged on 07/02/2022  Component Date Value Ref Range Status   Color, Urine 07/02/2022 YELLOW  YELLOW Final   APPearance 07/02/2022 HAZY (A)  CLEAR Final   Specific Gravity, Urine 07/02/2022 1.014  1.005 - 1.030 Final   pH 07/02/2022 5.0  5.0 - 8.0 Final   Glucose, UA 07/02/2022 NEGATIVE  NEGATIVE mg/dL Final   Hgb urine dipstick 07/02/2022 SMALL (A)  NEGATIVE Final   Bilirubin Urine  07/02/2022 NEGATIVE  NEGATIVE Final   Ketones, ur 07/02/2022 NEGATIVE  NEGATIVE mg/dL Final   Protein, ur 07/02/2022 NEGATIVE  NEGATIVE mg/dL Final   Nitrite 07/02/2022 POSITIVE (A)  NEGATIVE Final   Leukocytes,Ua 07/02/2022 LARGE (A)  NEGATIVE Final   RBC / HPF 07/02/2022 6-10  0 - 5 RBC/hpf Final   WBC, UA 07/02/2022 >50 (H)  0 - 5 WBC/hpf Final   Bacteria, UA 07/02/2022 FEW (A)  NONE SEEN Final   Squamous Epithelial / LPF 07/02/2022 6-10  0 - 5 Final   Mucus 07/02/2022 PRESENT   Final   Non Squamous Epithelial 07/02/2022 0-5 (A)  NONE SEEN Final   Performed at [facility], [place] [address], [place], [place] [address]   WBC 07/02/2022 9.9  4.0 - 10.5 K/uL Final   RBC 07/02/2022 5.18 (H)  3.87 - 5.11 MIL/uL Final   Hemoglobin 07/02/2022 15.8 (H)  12.0 - 15.0 g/dL Final   HCT 07/02/2022 48.5 (H)  36.0 - 46.0 % Final   MCV 07/02/2022 93.6  80.0 - 100.0 fL Final   MCH 07/02/2022 30.5  26.0 - 34.0 pg Final   MCHC 07/02/2022 32.6  30.0 - 36.0 g/dL Final   RDW 07/02/2022 13.5  11.5 - 15.5 % Final   Platelets 07/02/2022 249  150 - 400 K/uL Final   nRBC 07/02/2022 0.0  0.0 - 0.2 % Final   Performed at [facility], [place] [address] [place], [place] [address]   Sodium 07/02/2022 138  135 - 145 mmol/L Final   Potassium 07/02/2022 3.9  3.5 - 5.1 mmol/L Final   Chloride 07/02/2022 105  98 - 111 mmol/L Final   CO2 07/02/2022 26  22 - 32 mmol/L Final   Glucose, Bld 07/02/2022 114 (H)  70 - 99 mg/dL Final   Glucose reference range applies only to samples taken after fasting for at least 8 hours.   BUN 07/02/2022 9  6 - 20 mg/dL Final   Creatinine, Ser 07/02/2022 0.64  0.44 - 1.00 mg/dL Final   Calcium 07/02/2022 8.9  8.9 - 10.3 mg/dL Final   GFR, Estimated 07/02/2022 >60  >60 mL/min Final   Comment: (NOTE) Calculated  using the CKD-EPI Creatinine Equation (2021)    Anion gap 07/02/2022 7  5 - 15 Final   Performed at [facility], [place]  [address] [place], [place] [address]   Lipase 07/02/2022 35  11 - 51 U/L Final   Performed at [facility], [place] [address], [place], [place] [address]   Total Protein 07/02/2022 6.9  6.5 - 8.1 g/dL Final   Albumin 07/02/2022 3.8  3.5 - 5.0 g/dL Final   AST 07/02/2022 17  15 - 41 U/L Final   ALT 07/02/2022 16  0 - 44 U/L Final   Alkaline Phosphatase 07/02/2022 91  38 - 126 U/L Final   Total Bilirubin 07/02/2022 0.9  0.3 - 1.2 mg/dL Final   Bilirubin, Direct 07/02/2022 0.1  0.0 - 0.2 mg/dL Final   Indirect Bilirubin 07/02/2022 0.8  0.3 - 0.9 mg/dL Final   Performed at [facility], [place] [address], [place], [place] [address]  Admission on 06/06/2022, Discharged on 06/06/2022  Component Date Value Ref Range Status   Sodium 06/06/2022 140  135 - 145 mmol/L Final   Potassium 06/06/2022 3.2 (L)  3.5 - 5.1 mmol/L Final   Chloride 06/06/2022 103  98 - 111 mmol/L Final   CO2 06/06/2022 26  22 - 32 mmol/L Final   Glucose, Bld 06/06/2022 97  70 - 99 mg/dL Final   Glucose reference range applies only to samples taken after fasting for at least 8 hours.   BUN 06/06/2022 6  6 - 20 mg/dL Final   Creatinine, Ser 06/06/2022 0.54  0.44 - 1.00 mg/dL Final   Calcium 06/06/2022 9.0  8.9 - 10.3 mg/dL Final   Total Protein 06/06/2022 6.5  6.5 - 8.1 g/dL Final   Albumin 06/06/2022 3.6  3.5 - 5.0 g/dL Final   AST 06/06/2022 20  15 - 41 U/L Final   ALT 06/06/2022 17  0 - 44 U/L Final   Alkaline Phosphatase 06/06/2022 111  38 - 126 U/L Final   Total Bilirubin 06/06/2022 0.3  0.3 - 1.2 mg/dL Final   GFR, Estimated 06/06/2022 >60  >60 mL/min Final   Comment: (NOTE) Calculated using the CKD-EPI Creatinine Equation (2021)    Anion gap 06/06/2022 11  5 - 15 Final   Performed at [facility] [address] [place], [place] [address]   Alcohol, Ethyl (B) 06/06/2022 18 (H)  <10 mg/dL Final   Comment: (NOTE) Lowest detectable limit for serum alcohol is 10 mg/dL.  For  medical purposes only. Performed at [facility], [place] [address] [place], [place] [address]    WBC 06/06/2022 6.0  4.0 - 10.5 K/uL Final   RBC 06/06/2022 4.76  3.87 - 5.11 MIL/uL Final   Hemoglobin 06/06/2022 14.5  12.0 - 15.0 g/dL Final   HCT 06/06/2022 43.9  36.0 - 46.0 % Final   MCV 06/06/2022 92.2  80.0 - 100.0 fL Final   MCH 06/06/2022 30.5  26.0 - 34.0 pg Final   MCHC 06/06/2022 33.0  30.0 - 36.0 g/dL Final   RDW 06/06/2022 13.5  11.5 - 15.5 % Final   Platelets 06/06/2022 224  150 - 400 K/uL Final   nRBC 06/06/2022 0.0  0.0 - 0.2 % Final   Performed at [facility], [place] [address], [place], [place] [address]   I-stat hCG, quantitative 06/06/2022 <5.0  <5 mIU/mL Final   Comment 3 06/06/2022  Final   Comment:   GEST. AGE      CONC.  (mIU/mL)   <=1 WEEK        5 - 50     2 WEEKS       50 - 500     3 WEEKS       100 - 10,000     4 WEEKS     1,000 - 30,000        FEMALE AND NON-PREGNANT FEMALE:     LESS THAN 5 mIU/mL   Admission on 06/05/2022, Discharged on 06/05/2022  Component Date Value Ref Range Status   Sodium 06/05/2022 139  135 - 145 mmol/L Final   Potassium 06/05/2022 3.7  3.5 - 5.1 mmol/L Final   Chloride 06/05/2022 105  98 - 111 mmol/L Final   CO2 06/05/2022 26  22 - 32 mmol/L Final   Glucose, Bld 06/05/2022 114 (H)  70 - 99 mg/dL Final   Glucose reference range applies only to samples taken after fasting for at least 8 hours.   BUN 06/05/2022 8  6 - 20 mg/dL Final   Creatinine, Ser 06/05/2022 0.50  0.44 - 1.00 mg/dL Final   Calcium 06/05/2022 8.8 (L)  8.9 - 10.3 mg/dL Final   Total Protein 06/05/2022 7.1  6.5 - 8.1 g/dL Final   Albumin 06/05/2022 3.9  3.5 - 5.0 g/dL Final   AST 06/05/2022 20  15 - 41 U/L Final   ALT 06/05/2022 17  0 - 44 U/L Final   Alkaline Phosphatase 06/05/2022 120  38 - 126 U/L Final   Total Bilirubin 06/05/2022 0.5  0.3 - 1.2 mg/dL Final   GFR, Estimated 06/05/2022 >60  >60 mL/min Final   Comment: (NOTE) Calculated  using the CKD-EPI Creatinine Equation (2021)    Anion gap 06/05/2022 8  5 - 15 Final   Performed at [facility], [place] [address], [place], [place] [address]   WBC 06/05/2022 4.9  4.0 - 10.5 K/uL Final   RBC 06/05/2022 5.00  3.87 - 5.11 MIL/uL Final   Hemoglobin 06/05/2022 15.1 (H)  12.0 - 15.0 g/dL Final   HCT 06/05/2022 46.0  36.0 - 46.0 % Final   MCV 06/05/2022 92.0  80.0 - 100.0 fL Final   MCH 06/05/2022 30.2  26.0 - 34.0 pg Final   MCHC 06/05/2022 32.8  30.0 - 36.0 g/dL Final   RDW 06/05/2022 13.6  11.5 - 15.5 % Final   Platelets 06/05/2022 248  150 - 400 K/uL Final   nRBC 06/05/2022 0.0  0.0 - 0.2 % Final   Performed at [facility], [place] [address] [place], [place] [address]   Opiates 06/05/2022 NONE DETECTED  NONE DETECTED Final   Cocaine 06/05/2022 NONE DETECTED  NONE DETECTED Final   Benzodiazepines 06/05/2022 NONE DETECTED  NONE DETECTED Final   Amphetamines 06/05/2022 POSITIVE (A)  NONE DETECTED Final   Tetrahydrocannabinol 06/05/2022 NONE DETECTED  NONE DETECTED Final   Barbiturates 06/05/2022 NONE DETECTED  NONE DETECTED Final   Comment: (NOTE) DRUG SCREEN FOR MEDICAL PURPOSES ONLY.  IF CONFIRMATION IS NEEDED FOR ANY PURPOSE, NOTIFY LAB WITHIN 5 DAYS.  LOWEST DETECTABLE LIMITS FOR URINE DRUG SCREEN Drug Class                     Cutoff (ng/mL) Amphetamine and metabolites    1000 Barbiturate and metabolites    200 Benzodiazepine  200 Opiates and metabolites        300 Cocaine and metabolites        300 THC                            50 Performed at [facility], [address] [place], [place] [address]    Alcohol, Ethyl (B) 06/05/2022 <10  <10 mg/dL Final   Comment: (NOTE) Lowest detectable limit for serum alcohol is 10 mg/dL.  For medical purposes only. Performed at [facility], [address] [place], [place] [address]   Admission on 05/10/2022, Discharged on  05/10/2022  Component Date Value Ref Range Status   Opiates 05/10/2022 NONE DETECTED  NONE DETECTED Final   Cocaine 05/10/2022 POSITIVE (A)  NONE DETECTED Final   Benzodiazepines 05/10/2022 NONE DETECTED  NONE DETECTED Final   Amphetamines 05/10/2022 NONE DETECTED  NONE DETECTED Final   Tetrahydrocannabinol 05/10/2022 NONE DETECTED  NONE DETECTED Final   Barbiturates 05/10/2022 NONE DETECTED  NONE DETECTED Final   Comment: (NOTE) DRUG SCREEN FOR MEDICAL PURPOSES ONLY.  IF CONFIRMATION IS NEEDED FOR ANY PURPOSE, NOTIFY LAB WITHIN 5 DAYS.  LOWEST DETECTABLE LIMITS FOR URINE DRUG SCREEN Drug Class                     Cutoff (ng/mL) Amphetamine and metabolites    1000 Barbiturate and metabolites    200 Benzodiazepine                 200 Tricyclics and metabolites     300 Opiates and metabolites        300 Cocaine and metabolites        300 THC                            50 Performed at [facility], [address] [place], [place] [address]    Alcohol, Ethyl (B) 05/10/2022 <10  <10 mg/dL Final   Comment: (NOTE) Lowest detectable limit for serum alcohol is 10 mg/dL.  For medical purposes only. Performed at [facility], [address] [place], [place] [address]    Sodium 05/10/2022 137  135 - 145 mmol/L Final   Potassium 05/10/2022 4.2  3.5 - 5.1 mmol/L Final   Chloride 05/10/2022 102  98 - 111 mmol/L Final   CO2 05/10/2022 29  22 - 32 mmol/L Final   Glucose, Bld 05/10/2022 90  70 - 99 mg/dL Final   Glucose reference range applies only to samples taken after fasting for at least 8 hours.   BUN 05/10/2022 11  6 - 20 mg/dL Final   Creatinine, Ser 05/10/2022 0.57  0.44 - 1.00 mg/dL Final   Calcium 29/56/213010/02/2022 8.7 (L)  8.9 - 10.3 mg/dL Final   Total Protein 86/57/846910/02/2022 7.2  6.5 - 8.1 g/dL Final   Albumin 62/95/284110/02/2022 3.7  3.5 - 5.0 g/dL Final   AST 32/44/010210/02/2022 18  15 - 41 U/L Final   ALT 05/10/2022 16  0 - 44 U/L Final   Alkaline Phosphatase  05/10/2022 97  38 - 126 U/L Final   Total Bilirubin 05/10/2022 0.5  0.3 - 1.2 mg/dL Final   GFR, Estimated 05/10/2022 >60  >60 mL/min Final   Comment: (NOTE) Calculated using the CKD-EPI Creatinine Equation (2021)    Anion gap 05/10/2022 6  5 - 15 Final  Performed at [facility], [place] [address], [place], [place] [address]   WBC 05/10/2022 6.4  4.0 - 10.5 K/uL Final   RBC 05/10/2022 5.56 (H)  3.87 - 5.11 MIL/uL Final   Hemoglobin 05/10/2022 16.3 (H)  12.0 - 15.0 g/dL Final   HCT 05/10/2022 50.7 (H)  36.0 - 46.0 % Final   MCV 05/10/2022 91.2  80.0 - 100.0 fL Final   MCH 05/10/2022 29.3  26.0 - 34.0 pg Final   MCHC 05/10/2022 32.1  30.0 - 36.0 g/dL Final   RDW 05/10/2022 13.2  11.5 - 15.5 % Final   Platelets 05/10/2022 250  150 - 400 K/uL Final   nRBC 05/10/2022 0.0  0.0 - 0.2 % Final   Neutrophils Relative % 05/10/2022 49  % Final   Neutro Abs 05/10/2022 3.2  1.7 - 7.7 K/uL Final   Lymphocytes Relative 05/10/2022 40  % Final   Lymphs Abs 05/10/2022 2.6  0.7 - 4.0 K/uL Final   Monocytes Relative 05/10/2022 8  % Final   Monocytes Absolute 05/10/2022 0.5  0.1 - 1.0 K/uL Final   Eosinophils Relative 05/10/2022 1  % Final   Eosinophils Absolute 05/10/2022 0.0  0.0 - 0.5 K/uL Final   Basophils Relative 05/10/2022 1  % Final   Basophils Absolute 05/10/2022 0.0  0.0 - 0.1 K/uL Final   Immature Granulocytes 05/10/2022 1  % Final   Abs Immature Granulocytes 05/10/2022 0.04  0.00 - 0.07 K/uL Final   Performed at [facility], [place] [address] [place], [place] [address]   Lipase 05/10/2022 34  11 - 51 U/L Final   Performed at [facility], [place] [address] [place], [place] [address]  Admission on 05/08/2022, Discharged on 05/09/2022  Component Date Value Ref Range Status   SARS Coronavirus 2 by RT PCR 05/08/2022 NEGATIVE  NEGATIVE Final   Comment: (NOTE) SARS-CoV-2 target nucleic acids are NOT DETECTED.  The SARS-CoV-2 RNA is generally  detectable in upper respiratory specimens during the acute phase of infection. The lowest concentration of SARS-CoV-2 viral copies this assay can detect is 138 copies/mL. A negative result does not preclude SARS-Cov-2 infection and should not be used as the sole basis for treatment or other patient management decisions. A negative result may occur with  improper specimen collection/handling, submission of specimen other than nasopharyngeal swab, presence of viral mutation(s) within the areas targeted by this assay, and inadequate number of viral copies(<138 copies/mL). A negative result must be combined with clinical observations, patient history, and epidemiological information. The expected result is Negative.  Fact Sheet for Patients:  EntrepreneurPulse.com.au  Fact Sheet for Healthcare Providers:  IncredibleEmployment.be  This test is no                          t yet approved or cleared by the [place] FDA and  has been authorized for detection and/or diagnosis of SARS-CoV-2 by FDA under an Emergency Use Authorization (EUA). This EUA will remain  in effect (meaning this test can be used) for the duration of the COVID-19 declaration under Section 564(b)(1) of the Act, 21 U.S.C.section 360bbb-3(b)(1), unless the authorization is terminated  or revoked sooner.       Influenza A by PCR 05/08/2022 NEGATIVE  NEGATIVE Final   Influenza B by PCR 05/08/2022 NEGATIVE  NEGATIVE Final   Comment: (NOTE) The Xpert Xpress SARS-CoV-2/FLU/RSV plus assay is intended as an aid in the diagnosis of  influenza from Nasopharyngeal swab specimens and should not be used as a sole basis for treatment. Nasal washings and aspirates are unacceptable for Xpert Xpress SARS-CoV-2/FLU/RSV testing.  Fact Sheet for Patients: EntrepreneurPulse.com.au  Fact Sheet for Healthcare Providers: IncredibleEmployment.be  This test is not  yet approved or cleared by the [place] FDA and has been authorized for detection and/or diagnosis of SARS-CoV-2 by FDA under an Emergency Use Authorization (EUA). This EUA will remain in effect (meaning this test can be used) for the duration of the COVID-19 declaration under Section 564(b)(1) of the Act, 21 U.S.C. section 360bbb-3(b)(1), unless the authorization is terminated or revoked.  Performed at [facility], [place] [address], [place], [place] [address]    WBC 05/08/2022 6.5  4.0 - 10.5 K/uL Final   RBC 05/08/2022 5.36 (H)  3.87 - 5.11 MIL/uL Final   Hemoglobin 05/08/2022 16.1 (H)  12.0 - 15.0 g/dL Final   HCT 05/08/2022 47.9 (H)  36.0 - 46.0 % Final   MCV 05/08/2022 89.4  80.0 - 100.0 fL Final   MCH 05/08/2022 30.0  26.0 - 34.0 pg Final   MCHC 05/08/2022 33.6  30.0 - 36.0 g/dL Final   RDW 05/08/2022 13.2  11.5 - 15.5 % Final   Platelets 05/08/2022 237  150 - 400 K/uL Final   nRBC 05/08/2022 0.0  0.0 - 0.2 % Final   Neutrophils Relative % 05/08/2022 37  % Final   Neutro Abs 05/08/2022 2.4  1.7 - 7.7 K/uL Final   Lymphocytes Relative 05/08/2022 52  % Final   Lymphs Abs 05/08/2022 3.4  0.7 - 4.0 K/uL Final   Monocytes Relative 05/08/2022 8  % Final   Monocytes Absolute 05/08/2022 0.5  0.1 - 1.0 K/uL Final   Eosinophils Relative 05/08/2022 1  % Final   Eosinophils Absolute 05/08/2022 0.1  0.0 - 0.5 K/uL Final   Basophils Relative 05/08/2022 1  % Final   Basophils Absolute 05/08/2022 0.0  0.0 - 0.1 K/uL Final   Immature Granulocytes 05/08/2022 1  % Final   Abs Immature Granulocytes 05/08/2022 0.05  0.00 - 0.07 K/uL Final   Performed at [facility], [place] [address], [place], [place] [address]   Sodium 05/08/2022 135  135 - 145 mmol/L Final   Potassium 05/08/2022 3.9  3.5 - 5.1 mmol/L Final   Chloride 05/08/2022 100  98 - 111 mmol/L Final   CO2 05/08/2022 27  22 - 32 mmol/L Final   Glucose, Bld 05/08/2022 93  70 - 99 mg/dL Final   Glucose reference range  applies only to samples taken after fasting for at least 8 hours.   BUN 05/08/2022 8  6 - 20 mg/dL Final   Creatinine, Ser 05/08/2022 0.68  0.44 - 1.00 mg/dL Final   Calcium 05/08/2022 9.0  8.9 - 10.3 mg/dL Final   Total Protein 05/08/2022 6.6  6.5 - 8.1 g/dL Final   Albumin 05/08/2022 3.7  3.5 - 5.0 g/dL Final   AST 05/08/2022 17  15 - 41 U/L Final   ALT 05/08/2022 17  0 - 44 U/L Final   Alkaline Phosphatase 05/08/2022 111  38 - 126 U/L Final   Total Bilirubin 05/08/2022 0.6  0.3 - 1.2 mg/dL Final   GFR, Estimated 05/08/2022 >60  >60 mL/min Final   Comment: (NOTE) Calculated using the CKD-EPI Creatinine Equation (2021)    Anion gap 05/08/2022 8  5 - 15 Final   Performed at [place]  [address] [place], [place] [address]   Hgb A1c MFr Bld 05/08/2022 4.9  4.8 - 5.6 % Final   Comment: (NOTE) Pre diabetes:          5.7%-6.4%  Diabetes:              >6.4%  Glycemic control for   <7.0% adults with diabetes    Mean Plasma Glucose 05/08/2022 93.93  mg/dL Final   Performed at [facility], [place] [address] [place], [place] [address]   Preg Test, Ur 05/08/2022 NEGATIVE  NEGATIVE Final   Performed at [facility], [place] [address], [place], [place] [address]   POC Amphetamine UR 05/08/2022 None Detected  NONE DETECTED (Cut Off Level 1000 ng/mL) Final   POC Secobarbital (BAR) 05/08/2022 None Detected  NONE DETECTED (Cut Off Level 300 ng/mL) Final   POC Buprenorphine (BUP) 05/08/2022 None Detected  NONE DETECTED (Cut Off Level 10 ng/mL) Final   POC Oxazepam (BZO) 05/08/2022 None Detected  NONE DETECTED (Cut Off Level 300 ng/mL) Final   POC Cocaine UR 05/08/2022 None Detected  NONE DETECTED (Cut Off Level 300 ng/mL) Final   POC Methamphetamine UR 05/08/2022 Positive (A)  NONE DETECTED (Cut Off Level 1000 ng/mL) Final   POC Morphine 05/08/2022 None Detected  NONE DETECTED (Cut Off Level 300 ng/mL) Final   POC Methadone UR 05/08/2022 None Detected  NONE DETECTED (Cut Off  Level 300 ng/mL) Final   POC Oxycodone UR 05/08/2022 None Detected  NONE DETECTED (Cut Off Level 100 ng/mL) Final   POC Marijuana UR 05/08/2022 None Detected  NONE DETECTED (Cut Off Level 50 ng/mL) Final   SARSCOV2ONAVIRUS 2 AG 05/08/2022 NEGATIVE  NEGATIVE Final   Comment: (NOTE) SARS-CoV-2 antigen NOT DETECTED.   Negative results are presumptive.  Negative results do not preclude SARS-CoV-2 infection and should not be used as the sole basis for treatment or other patient management decisions, including infection  control decisions, particularly in the presence of clinical signs and  symptoms consistent with COVID-19, or in those who have been in contact with the virus.  Negative results must be combined with clinical observations, patient history, and epidemiological information. The expected result is Negative.  Fact Sheet for Patients: HandmadeRecipes.com.cy  Fact Sheet for Healthcare Providers: FuneralLife.at  This test is not yet approved or cleared by the [place] FDA and  has been authorized for detection and/or diagnosis of SARS-CoV-2 by FDA under an Emergency Use Authorization (EUA).  This EUA will remain in effect (meaning this test can be used) for the duration of  the COV                          ID-19 declaration under Section 564(b)(1) of the Act, 21 U.S.C. section 360bbb-3(b)(1), unless the authorization is terminated or revoked sooner.     Preg Test, Ur 05/08/2022 NEGATIVE  NEGATIVE Final   Comment:        THE SENSITIVITY OF THIS METHODOLOGY IS >24 mIU/mL    Cholesterol 05/08/2022 187  0 - 200 mg/dL Final   Triglycerides 05/08/2022 117  <150 mg/dL Final   HDL 05/08/2022 67  >40 mg/dL Final   Total CHOL/HDL Ratio 05/08/2022 2.8  RATIO Final   VLDL 05/08/2022 23  0 - 40 mg/dL Final   LDL Cholesterol 05/08/2022 97  0 - 99 mg/dL Final   Comment:        Total Cholesterol/HDL:CHD Risk Coronary Heart Disease Risk  Table                     Men   Women  1/2 Average Risk   3.4   3.3  Average Risk       5.0   4.4  2 X Average Risk   9.6   7.1  3 X Average Risk  23.4   11.0        Use the calculated Patient Ratio above and the CHD Risk Table to determine the patient's CHD Risk.        ATP III CLASSIFICATION (LDL):  <100     mg/dL   Optimal  100-129  mg/dL   Near or Above                    Optimal  130-159  mg/dL   Borderline  160-189  mg/dL   High  >190     mg/dL   Very High Performed at [place] [address] [place], [place] [address]    TSH 05/08/2022 3.264  0.350 - 4.500 uIU/mL Final   Comment: Performed by a 3rd Generation assay with a functional sensitivity of <=0.01 uIU/mL. Performed at [facility], [place] [address], [place], [place] [address]   Admission on 03/20/2022, Discharged on 03/20/2022  Component Date Value Ref Range Status   WBC 03/20/2022 9.3  4.0 - 10.5 K/uL Final   RBC 03/20/2022 4.76  3.87 - 5.11 MIL/uL Final   Hemoglobin 03/20/2022 14.2  12.0 - 15.0 g/dL Final   HCT 03/20/2022 44.0  36.0 - 46.0 % Final   MCV 03/20/2022 92.4  80.0 - 100.0 fL Final   MCH 03/20/2022 29.8  26.0 - 34.0 pg Final   MCHC 03/20/2022 32.3  30.0 - 36.0 g/dL Final   RDW 03/20/2022 12.4  11.5 - 15.5 % Final   Platelets 03/20/2022 286  150 - 400 K/uL Final   nRBC 03/20/2022 0.0  0.0 - 0.2 % Final   Neutrophils Relative % 03/20/2022 56  % Final   Neutro Abs 03/20/2022 5.3  1.7 - 7.7 K/uL Final   Lymphocytes Relative 03/20/2022 32  % Final   Lymphs Abs 03/20/2022 3.0  0.7 - 4.0 K/uL Final   Monocytes Relative 03/20/2022 8  % Final   Monocytes Absolute 03/20/2022 0.7  0.1 - 1.0 K/uL Final   Eosinophils Relative 03/20/2022 2  % Final   Eosinophils Absolute 03/20/2022 0.2  0.0 - 0.5 K/uL Final   Basophils Relative 03/20/2022 1  % Final   Basophils Absolute 03/20/2022 0.1  0.0 - 0.1 K/uL Final   Immature Granulocytes 03/20/2022 1  % Final   Abs Immature Granulocytes 03/20/2022  0.06  0.00 - 0.07 K/uL Final   Performed at [facility], [place] [address], [place], [place] [address]   Sodium 03/20/2022 140  135 - 145 mmol/L Final   Potassium 03/20/2022 4.2  3.5 - 5.1 mmol/L Final   Chloride 03/20/2022 106  98 - 111 mmol/L Final   CO2 03/20/2022 25  22 - 32 mmol/L Final   Glucose, Bld 03/20/2022 89  70 - 99 mg/dL Final   Glucose reference range applies only to samples taken after fasting for at least 8 hours.   BUN 03/20/2022 17  6 - 20 mg/dL Final   Creatinine, Ser 03/20/2022 0.64  0.44 - 1.00 mg/dL Final   Calcium 03/20/2022 9.2  8.9 - 10.3 mg/dL Final  Total Protein 03/20/2022 7.1  6.5 - 8.1 g/dL Final   Albumin 03/20/2022 4.1  3.5 - 5.0 g/dL Final   AST 03/20/2022 18  15 - 41 U/L Final   ALT 03/20/2022 26  0 - 44 U/L Final   Alkaline Phosphatase 03/20/2022 86  38 - 126 U/L Final   Total Bilirubin 03/20/2022 0.4  0.3 - 1.2 mg/dL Final   GFR, Estimated 03/20/2022 >60  >60 mL/min Final   Comment: (NOTE) Calculated using the CKD-EPI Creatinine Equation (2021)    Anion gap 03/20/2022 9  5 - 15 Final   Performed at [facility], [place] [address] [place], [place] [address]   Color, Urine 03/20/2022 STRAW (A)  YELLOW Final   APPearance 03/20/2022 CLEAR  CLEAR Final   Specific Gravity, Urine 03/20/2022 1.008  1.005 - 1.030 Final   pH 03/20/2022 6.0  5.0 - 8.0 Final   Glucose, UA 03/20/2022 NEGATIVE  NEGATIVE mg/dL Final   Hgb urine dipstick 03/20/2022 NEGATIVE  NEGATIVE Final   Bilirubin Urine 03/20/2022 NEGATIVE  NEGATIVE Final   Ketones, ur 03/20/2022 NEGATIVE  NEGATIVE mg/dL Final   Protein, ur 03/20/2022 NEGATIVE  NEGATIVE mg/dL Final   Nitrite 03/20/2022 NEGATIVE  NEGATIVE Final   Leukocytes,Ua 03/20/2022 NEGATIVE  NEGATIVE Final   Performed at [facility], [place] [address], [place], [place] [address]  Admission on 03/10/2022, Discharged on 03/20/2022  Component Date Value Ref Range Status   Magnesium 03/11/2022 1.9  1.7 - 2.4 mg/dL Final    Performed at [facility], [place] [address], [place], [place] [address]   Hepatitis B Surface Ag 03/11/2022 NON REACTIVE  NON REACTIVE Final   HCV Ab 03/11/2022 NON REACTIVE  NON REACTIVE Final   Comment: (NOTE) Nonreactive HCV antibody screen is consistent with no HCV infections,  unless recent infection is suspected or other evidence exists to indicate HCV infection.     Hep A IgM 03/11/2022 NON REACTIVE  NON REACTIVE Final   Hep B C IgM 03/11/2022 NON REACTIVE  NON REACTIVE Final   Performed at [facility], [place] [address] [place], [place] [address]   Vit D, 25-Hydroxy 03/11/2022 31.00  30 - 100 ng/mL Final   Comment: (NOTE) Vitamin D deficiency has been defined by the [place] practice guideline as a level of serum 25-OH  vitamin D less than 20 ng/mL (1,2). The Endocrine Society went on to  further define vitamin D insufficiency as a level between 21 and 29  ng/mL (2).  1. IOM (Institute of Medicine). 2010. Dietary reference intakes for  calcium and D. [place]: The [employer]. 2. Holick MF, Binkley [place], Bischoff-Ferrari HA, et al. Evaluation,  treatment, and prevention of vitamin D deficiency: an Endocrine  Society clinical practice guideline, JCEM. 2011 Jul; 96(7): 1911-30.  Performed at [facility], [place] [address] [place], [place] [address]    Vitamin B-12 03/11/2022 201  180 - 914 pg/mL Final   Comment: (NOTE) This assay is not validated for testing neonatal or myeloproliferative syndrome specimens for Vitamin B12 levels. Performed at [facility], [place] [address] [place], [place] [address]    TSH 03/11/2022 3.487  0.350 - 4.500 uIU/mL Final   Comment: Performed by a 3rd Generation assay with a functional sensitivity of <=0.01 uIU/mL. Performed at [facility], [place] [address] [place], [place] [address]    RPR Ser Ql 03/11/2022  NON REACTIVE   NON REACTIVE Final   Performed at [facility], [place] [address] [place], [place] [address]   Cholesterol 03/13/2022 194  0 - 200 mg/dL Final   Triglycerides 03/13/2022 53  <150 mg/dL Final   HDL 03/13/2022 54  >40 mg/dL Final   Total CHOL/HDL Ratio 03/13/2022 3.6  RATIO Final   VLDL 03/13/2022 11  0 - 40 mg/dL Final   LDL Cholesterol 03/13/2022 129 (H)  0 - 99 mg/dL Final   Comment:        Total Cholesterol/HDL:CHD Risk Coronary Heart Disease Risk Table                     Men   Women  1/2 Average Risk   3.4   3.3  Average Risk       5.0   4.4  2 X Average Risk   9.6   7.1  3 X Average Risk  23.4   11.0        Use the calculated Patient Ratio above and the CHD Risk Table to determine the patient's CHD Risk.        ATP III CLASSIFICATION (LDL):  <100     mg/dL   Optimal  100-129  mg/dL   Near or Above                    Optimal  130-159  mg/dL   Borderline  160-189  mg/dL   High  >190     mg/dL   Very High Performed at [place] [address] [place], [place] [address]    Hgb A1c MFr Bld 03/13/2022 5.1  4.8 - 5.6 % Final   Comment: (NOTE) Pre diabetes:          5.7%-6.4%  Diabetes:              >6.4%  Glycemic control for   <7.0% adults with diabetes    Mean Plasma Glucose 03/13/2022 99.67  mg/dL Final   Performed at [facility], [place] [address] [place], [place] [address]   Sodium 03/14/2022 138  135 - 145 mmol/L Final   Potassium 03/14/2022 4.3  3.5 - 5.1 mmol/L Final   Chloride 03/14/2022 107  98 - 111 mmol/L Final   CO2 03/14/2022 26  22 - 32 mmol/L Final   Glucose, Bld 03/14/2022 89  70 - 99 mg/dL Final   Glucose reference range applies only to samples taken after fasting for at least 8 hours.   BUN 03/14/2022 17  6 - 20 mg/dL Final   Creatinine, Ser 03/14/2022 0.65  0.44 - 1.00 mg/dL Final   Calcium 03/14/2022 9.1  8.9 - 10.3 mg/dL Final   Total Protein 03/14/2022 6.5  6.5 - 8.1 g/dL Final   Albumin 03/14/2022 3.6  3.5 - 5.0  g/dL Final   AST 03/14/2022 14 (L)  15 - 41 U/L Final   ALT 03/14/2022 15  0 - 44 U/L Final   Alkaline Phosphatase 03/14/2022 71  38 - 126 U/L Final   Total Bilirubin 03/14/2022 0.3  0.3 - 1.2 mg/dL Final   GFR, Estimated 03/14/2022 >60  >60 mL/min Final   Comment: (NOTE) Calculated using the CKD-EPI Creatinine Equation (2021)    Anion gap 03/14/2022 5  5 - 15 Final   Performed at [facility], [place] [address], [place], [place] [address]   Lipase 03/14/2022 32  11 -  51 U/L Final   Performed at [facility], [place] [address], [place], [place] [address]   WBC 03/14/2022 8.8  4.0 - 10.5 K/uL Final   RBC 03/14/2022 4.37  3.87 - 5.11 MIL/uL Final   Hemoglobin 03/14/2022 13.2  12.0 - 15.0 g/dL Final   HCT 03/14/2022 39.9  36.0 - 46.0 % Final   MCV 03/14/2022 91.3  80.0 - 100.0 fL Final   MCH 03/14/2022 30.2  26.0 - 34.0 pg Final   MCHC 03/14/2022 33.1  30.0 - 36.0 g/dL Final   RDW 03/14/2022 12.4  11.5 - 15.5 % Final   Platelets 03/14/2022 230  150 - 400 K/uL Final   nRBC 03/14/2022 0.0  0.0 - 0.2 % Final   Neutrophils Relative % 03/14/2022 52  % Final   Neutro Abs 03/14/2022 4.7  1.7 - 7.7 K/uL Final   Lymphocytes Relative 03/14/2022 36  % Final   Lymphs Abs 03/14/2022 3.1  0.7 - 4.0 K/uL Final   Monocytes Relative 03/14/2022 9  % Final   Monocytes Absolute 03/14/2022 0.8  0.1 - 1.0 K/uL Final   Eosinophils Relative 03/14/2022 2  % Final   Eosinophils Absolute 03/14/2022 0.2  0.0 - 0.5 K/uL Final   Basophils Relative 03/14/2022 0  % Final   Basophils Absolute 03/14/2022 0.0  0.0 - 0.1 K/uL Final   Immature Granulocytes 03/14/2022 1  % Final   Abs Immature Granulocytes 03/14/2022 0.04  0.00 - 0.07 K/uL Final   Performed at [facility], [place] [address], [place], [place] [address]   Color, Urine 03/14/2022 YELLOW  YELLOW Final   APPearance 03/14/2022 HAZY (A)  CLEAR Final   Specific Gravity, Urine 03/14/2022 1.012  1.005 - 1.030  Final   pH 03/14/2022 6.0  5.0 - 8.0 Final   Glucose, UA 03/14/2022 NEGATIVE  NEGATIVE mg/dL Final   Hgb urine dipstick 03/14/2022 NEGATIVE  NEGATIVE Final   Bilirubin Urine 03/14/2022 NEGATIVE  NEGATIVE Final   Ketones, ur 03/14/2022 NEGATIVE  NEGATIVE mg/dL Final   Protein, ur 03/14/2022 NEGATIVE  NEGATIVE mg/dL Final   Nitrite 03/14/2022 NEGATIVE  NEGATIVE Final   Leukocytes,Ua 03/14/2022 LARGE (A)  NEGATIVE Final   RBC / HPF 03/14/2022 0-5  0 - 5 RBC/hpf Final   WBC, UA 03/14/2022 21-50  0 - 5 WBC/hpf Final   Bacteria, UA 03/14/2022 RARE (A)  NONE SEEN Final   Squamous Epithelial / LPF 03/14/2022 0-5  0 - 5 Final   Performed at [facility], [place] [address], [place], [place] [address]   Preg Test, Ur 03/14/2022 NEGATIVE  NEGATIVE Final   Comment:        THE SENSITIVITY OF THIS METHODOLOGY IS >20 mIU/mL. Performed at [facility], [place] [address], [place], [place] [address]    Specimen Description 03/14/2022    Final                   Value:URINE, CLEAN CATCH Performed at [facility], [place] [address], [place], [place] [address]    Special Requests 03/14/2022    Final                   Value:NONE Performed at [facility], [place] [address] [place],  [address]    Culture 03/14/2022 MULTIPLE SPECIES PRESENT, SUGGEST RECOLLECTION (A)   Final   Report Status 03/14/2022 03/16/2022 FINAL   Final  There may be more visits with  results that are not included.    Blood Alcohol level:  Lab Results  Component Value Date   ETH 37 (H) 07/17/2022   ETH 18 (H) 99991111    Metabolic Disorder Labs: Lab Results  Component Value Date   HGBA1C 4.9 05/08/2022   MPG 93.93 05/08/2022   MPG 99.67 03/13/2022   No results found for: "PROLACTIN" Lab Results  Component Value Date   CHOL 187 05/08/2022   TRIG 117 05/08/2022   HDL 67 05/08/2022   CHOLHDL 2.8 05/08/2022   VLDL 23 05/08/2022   LDLCALC 97  05/08/2022   LDLCALC 129 (H) 03/13/2022    Therapeutic Lab Levels: No results found for: "LITHIUM" No results found for: "VALPROATE" No results found for: "CBMZ"  Physical Findings   AIMS    Flowsheet Row ED to Hosp-Admission (Discharged) from 03/10/2022 in [place] 300B  AIMS Total Score 0      AUDIT    Flowsheet Row ED to Hosp-Admission (Discharged) from 03/10/2022 in [facility] 300B  Alcohol Use Disorder Identification Test Final Score (AUDIT) 32      CAGE-AID    Flowsheet Row ED from 01/17/2022 in [facility] Score 1      [place] Office Visit from 03/12/2021 in [place] 1  Total GAD-7 Score 20      PHQ2-9    [place] ED from 07/17/2022 in [facility] ED from 05/08/2022 in [facility] Office Visit from 03/12/2021 in [facility] 1  PHQ-2 Total Score 6 2 5   PHQ-9 Total Score 26 11 22       Flowsheet Row ED from 07/17/2022 in [facility] Most recent reading at 07/17/2022  6:11 PM ED from 07/15/2022 in [facility] DEPT Most recent reading at 07/15/2022  8:29 AM Admission (Pending) from OP Visit from 07/15/2022 in [facility] Most recent reading at 07/15/2022 12:36 AM  C-SSRS RISK CATEGORY No Risk No Risk No Risk        Musculoskeletal  Strength & Muscle Tone: within normal limits Gait & Station:  Patient remained in bed during assessment Patient leans: N/A  Psychiatric Specialty Exam  Presentation  General Appearance:  Appropriate for Environment  Eye Contact: Fair  Speech: Clear and Coherent  Speech Volume: Normal  Handedness: Right   Mood and Affect  Mood: Dysphoric  Affect: Restricted   Thought Process  Thought Processes: Linear  Descriptions of  Associations:Intact  Orientation:Full (Time, Place and Person)  Thought Content:Logical  Diagnosis of Schizophrenia or Schizoaffective disorder in past: No    Hallucinations:Hallucinations: Command Description of Command Hallucinations: "They tell me what to do, where to go, things like that."  Ideas of Reference:None  Suicidal Thoughts:Suicidal Thoughts: No  Homicidal Thoughts:Homicidal Thoughts: No   Sensorium  Memory: Immediate Fair; Recent Fair; Remote Fair  Judgment: Intact  Insight: Shallow   Executive Functions  Concentration: Fair  Attention Span: Fair  Recall: [employer] of Knowledge: Fair  Language: Good   Psychomotor Activity  Psychomotor Activity: Psychomotor Activity: Decreased   Assets  Assets: Resilience   Sleep  Sleep: Sleep: Good   No data recorded  Physical Exam  Physical Exam HENT:     Head: Normocephalic and atraumatic.  Pulmonary:     Effort: Pulmonary effort is normal.  Neurological:     Mental Status:  She is alert and oriented to person, place, and time.    Review of Systems  Cardiovascular:  Positive for chest pain.       Patient reports this is chronic and unchanged, endorsing multiple EKGs due to the same chronic chest pain in the past and describes the feeling as more of a "tightness."  Gastrointestinal:  Positive for nausea. Negative for vomiting.  Neurological:  Positive for headaches.  Psychiatric/Behavioral:  Positive for hallucinations. Negative for suicidal ideas. The patient does not have insomnia.    Blood pressure 96/74, pulse 77, temperature 97.9 F (36.6 C), temperature source Oral, resp. rate 18, height 5\' 3"  (1.6 m), weight 110 lb (49.9 kg), SpO2 100 %. Body mass index is 19.49 kg/m.  Treatment Plan Summary: Daily contact with patient to assess and evaluate symptoms and progress in treatment and Medication management    Patient appears to have MDD with psychotic features.  Patient does not  screen positive for bipolar disorder however, with endorsing no psychotic features and history of improvement with the addition of an SGA, we will restart patient on both Lexapro and Abilify to treat depressive symptoms with CAH.Of note patient asked about being medication for "possible ADHD"-did not recommend this for patient.  Patient's poor concentration is likely secondary to depressive symptoms however, would consider being on the lookout for drug seeking.  MDD, recurrent, with psychotic features - Start Lexapro 10 mg daily - Start Abilify 5 mg daily    #Alcohol use disorder -Continue ativan taper for alcohol withdrawal -Continue CIWA: Last CIWA-0 (07/19/2022) -Continue PRNs:  Vistaril 25 mg po 3 times daily prn for anxiety Zofran 4 mg every 6 hours as needed for nausea or vomiting Imodium 2-4 mg po as needed for diarrhea  Thiamine 100 mg p.o. daily for nutritional supplementation Multivitamin 1 tablet by mouth daily for nutritional supplementation   #Chlamydia infection Patient informed of positive chlamydia test, treatment, and need for follow-up after treatment. It was also discussed with patient to inform partners of her positive test result and need to get treatment. -Continue doxycycline 100mg  BID x7 days (12/15-12/22)   PGY-3 Freida Busman, MD 07/19/2022 10:59 AM

## 2022-07-19 NOTE — ED Notes (Signed)
Patient A&Ox4. Denies intent to harm self/others when asked. Denies A/VH. Patient denies any physical complaints when asked. No acute distress noted. Pt states, "I think I slept pretty good last night. I can't remember ever getting up so I must have". Praise given.  Routine safety checks conducted according to facility protocol. Encouraged patient to notify staff if thoughts of harm toward self or others arise. Patient verbalize understanding and agreement. Will continue to monitor for safety.

## 2022-07-19 NOTE — ED Notes (Signed)
Pt resting quietly, breathing is even and unlabored.  Pt denies SI, HI, pain and AVH.  Will continue to monitor for safety.  

## 2022-07-20 DIAGNOSIS — Z1152 Encounter for screening for COVID-19: Secondary | ICD-10-CM | POA: Diagnosis not present

## 2022-07-20 DIAGNOSIS — F10230 Alcohol dependence with withdrawal, uncomplicated: Secondary | ICD-10-CM | POA: Diagnosis not present

## 2022-07-20 DIAGNOSIS — F1911 Other psychoactive substance abuse, in remission: Secondary | ICD-10-CM | POA: Diagnosis not present

## 2022-07-20 MED ORDER — NALTREXONE HCL 50 MG PO TABS
25.0000 mg | ORAL_TABLET | Freq: Every day | ORAL | Status: DC
  Administered 2022-07-20 – 2022-07-22 (×3): 25 mg via ORAL
  Filled 2022-07-20: qty 28
  Filled 2022-07-20 (×3): qty 1

## 2022-07-20 MED ORDER — NICOTINE 21 MG/24HR TD PT24
21.0000 mg | MEDICATED_PATCH | Freq: Every day | TRANSDERMAL | Status: DC
  Administered 2022-07-20 – 2022-07-22 (×3): 21 mg via TRANSDERMAL
  Filled 2022-07-20: qty 1
  Filled 2022-07-20: qty 14
  Filled 2022-07-20 (×2): qty 1

## 2022-07-20 MED ORDER — ESCITALOPRAM OXALATE 10 MG PO TABS
10.0000 mg | ORAL_TABLET | Freq: Every day | ORAL | Status: DC
  Administered 2022-07-20 – 2022-07-22 (×3): 10 mg via ORAL
  Filled 2022-07-20: qty 14
  Filled 2022-07-20 (×2): qty 1
  Filled 2022-07-20: qty 14
  Filled 2022-07-20: qty 1

## 2022-07-20 NOTE — ED Notes (Signed)
Pt sitting in cafeteria eating dinner. No acute distress noted. Denies withdrawal sx at present. Will continue to monitor for safety.

## 2022-07-20 NOTE — ED Notes (Addendum)
Pt sleeping in no acute distress. RR even and unlabored. Environment secured. Will continue to monitor for safety. 

## 2022-07-20 NOTE — ED Notes (Signed)
Pt approached nurses station stating she was having aches to lower back region. Requested Tylenol. Tylenol administered. Informed pt to notify staff with any needs or concerns. Will continue to monitor for safety.

## 2022-07-20 NOTE — ED Notes (Signed)
Lunch was given 

## 2022-07-20 NOTE — ED Provider Notes (Signed)
Behavioral Health Progress Note  Date and Time: 07/20/2022 12:31 PM Name: Kristina Owens MRN:  [id number]  Subjective:  Kristina Owens is a 56 year old female patient with a documented past psychiatric history of polysubstance abuse, cocaine use disorder, alcohol use disorder, methamphetamine abuse, GAD, MDD, and PTSD requesting alcohol detox.    Patient reports that she did not sleep well last night due to having nightmares related to traumatic events that happened in her life. Patient reports that she is nervous today and is worried about her future being uncertain. Patient reports she was under the impression she is going [facility], provider reports that if this is what she was told, then this is what she she believe. Patietn reports that other than being anxious about her rehab plans, she is still feeling dysphoric and endorses that this is because she always thinks about how she has no family. Then patient reports that she is thinking about leaving the area after rehab and moving to [place] to be closer to her son, that she does not talk to regularly. Patietn also asks if anyone has spoken to her sister. Provider endorses that if patient wants her sister to know where she is, then she can call her using the phones provided.   Patient reports that she is having "low" level cravings for Etoh, and would be interested in naltrexone. Patient reports that she has been on the medication before, but drank over it anyway. Patient reports that she thinks the difference this time, would be "I actually have the drive and want to stop drinking." Patient reports that despite her previous failures on the medicine she does think it had some impact on her.    Patient denies SI, HI and AVH today, but reports she is still worried about being schizophrenia. Provider again reiterates that this is unlikely at this time.   Objectively, a lot of today's conversation was exactly the same as yesterday.    Diagnosis:  Final diagnoses:  Alcohol-induced mood disorder (HCC)  Alcohol dependence with uncomplicated withdrawal (HCC)    Total Time spent with patient: 20 minutes  Past Psychiatric History: polysubstance abuse, cocaine use disorder, alcohol use disorder, methamphetamine abuse, GAD, MDD, and PTSD  Past Medical History:  Past Medical History:  Diagnosis Date   Anxiety    Depression    DVT (deep vein thrombosis) in pregnancy    GERD (gastroesophageal reflux disease)    Ovarian cyst     Past Surgical History:  Procedure Laterality Date   APPENDECTOMY  age 79   IR PTA VENOUS EXCEPT DIALYSIS CIRCUIT  01/16/2021   IR RADIOLOGIST EVAL & MGMT  03/19/2021   IR THROMBECT VENO MECH MOD SED  01/16/2021   IR US GUIDE VASC ACCESS LEFT  01/16/2021   IR VENO/EXT/UNI LEFT  01/16/2021   IR VENOCAVAGRAM IVC  01/16/2021   TONSILLECTOMY Bilateral age 63   Family History:  Family History  Problem Relation Age of Onset   Asthma Mother    COPD Mother    Cancer Father        thyroid cancer   Family Psychiatric  History:  family psychiatric history of deceased father was an alcoholic, deceased mother had a history of depression, and sister has a history of depression.    Social History:  Social History   Substance and Sexual Activity  Alcohol Use Yes     Social History   Substance and Sexual Activity  Drug Use Yes   Types: "Crack" cocaine,  Cocaine, Methamphetamines   Comment: last used 4/31/22    Social History   Socioeconomic History   Marital status: Divorced    Spouse name: Not on file   Number of children: Not on file   Years of education: Not on file   Highest education level: Not on file  Occupational History   Not on file  Tobacco Use   Smoking status: Every Day    Packs/day: 0.50    Years: 35.00    Total pack years: 17.50    Types: Cigarettes   Smokeless tobacco: Never  Vaping Use   Vaping Use: Never used  Substance and Sexual Activity   Alcohol use: Yes   Drug  use: Yes    Types: "Crack" cocaine, Cocaine, Methamphetamines    Comment: last used 4/31/22   Sexual activity: Not on file  Other Topics Concern   Not on file  Social History Narrative   Not on file   Social Determinants of Health   Financial Resource Strain: Not on file  Food Insecurity: Not on file  Transportation Needs: Not on file  Physical Activity: Not on file  Stress: Not on file  Social Connections: Not on file   SDOH:  SDOH Screenings   Alcohol Screen: High Risk (03/10/2022)  Depression (PHQ2-9): High Risk (07/19/2022)  Tobacco Use: High Risk (07/14/2022)   Additional Social History:    Pain Medications: See MAR Prescriptions: See MAR Over the Counter: See MAR History of alcohol / drug use?: Yes Longest period of sobriety (when/how long): 2 months Negative Consequences of Use: Legal Withdrawal Symptoms: Nausea / Vomiting, Tremors, Fever / Chills, Sweats, Other (Comment) (Headache) Name of Substance 1: Alcohol 1 - Age of First Use: 16 1 - Amount (size/oz): four 4LOCOS 1 - Duration: ongoing 1 - Last Use / Amount: 1 [place] Mama today 1 - Method of Aquiring: Stealing 1- Route of Use: Oral Name of Substance 2: Methamohetamine 2 - Age of First Use: 47 2 - Amount (size/oz): $20 worth 2 - Frequency: Weekly 2 - Duration: Ongoing 2 - Last Use / Amount: 5 days ago 2 - Method of Aquiring: Purchases off the street 2 - Route of Substance Use: oral                Sleep: Poor  Appetite:  Good  Current Medications:  Current Facility-Administered Medications  Medication Dose Route Frequency Provider Last Rate Last Admin   acetaminophen (TYLENOL) tablet 650 mg  650 mg Oral Q6H PRN White, Patrice L, NP   650 mg at 07/19/22 2347   alum & mag hydroxide-simeth (MAALOX/MYLANTA) 200-200-20 MG/5ML suspension 30 mL  30 mL Oral Q4H PRN White, Patrice L, NP       ARIPiprazole (ABILIFY) tablet 5 mg  5 mg Oral Daily Jasaiah Karwowski, Gerlean Ren B, MD   5 mg at 07/20/22 0919   doxycycline  (VIBRA-TABS) tablet 100 mg  100 mg Oral Q12H Karie Fetch, MD   100 mg at 07/20/22 0919   escitalopram (LEXAPRO) tablet 10 mg  10 mg Oral Daily Eliseo Gum B, MD   10 mg at 07/20/22 1914   hydrOXYzine (ATARAX) tablet 25 mg  25 mg Oral TID PRN White, Patrice L, NP   25 mg at 07/17/22 1826   loperamide (IMODIUM) capsule 2-4 mg  2-4 mg Oral PRN White, Patrice L, NP   2 mg at 07/17/22 2133   LORazepam (ATIVAN) tablet 1 mg  1 mg Oral Q6H PRN Layla Barter, NP       [  START ON 07/21/2022] LORazepam (ATIVAN) tablet 1 mg  1 mg Oral Daily White, Patrice L, NP       magnesium hydroxide (MILK OF MAGNESIA) suspension 30 mL  30 mL Oral Daily PRN White, Patrice L, NP       multivitamin with minerals tablet 1 tablet  1 tablet Oral Daily White, Patrice L, NP   1 tablet at 07/20/22 0917   naltrexone (DEPADE) tablet 25 mg  25 mg Oral Daily Eliseo Gum B, MD   25 mg at 07/20/22 1218   ondansetron (ZOFRAN-ODT) disintegrating tablet 4 mg  4 mg Oral Q6H PRN White, Patrice L, NP   4 mg at 07/20/22 1219   thiamine (VITAMIN B1) injection 100 mg  100 mg Intramuscular Once White, Patrice L, NP       thiamine (VITAMIN B1) tablet 100 mg  100 mg Oral Daily White, Patrice L, NP   100 mg at 07/20/22 0919   Current Outpatient Medications  Medication Sig Dispense Refill   ARIPiprazole (ABILIFY) 15 MG tablet Take 1 tablet (15 mg total) by mouth daily. 30 tablet 0   escitalopram (LEXAPRO) 5 MG tablet Take 3 tablets (15 mg total) by mouth daily. 90 tablet 0   gabapentin (NEURONTIN) 100 MG capsule Take 2 capsules (200 mg total) by mouth 3 (three) times daily. 180 capsule 0   ibuprofen (ADVIL) 800 MG tablet Take 1 tablet (800 mg total) by mouth 3 (three) times daily. 21 tablet 0   Multiple Vitamin (MULTIVITAMIN WITH MINERALS) TABS tablet Take 1 tablet by mouth daily.     ondansetron (ZOFRAN) 4 MG tablet Take 1 tablet (4 mg total) by mouth every 6 (six) hours. 12 tablet 0   rivaroxaban (XARELTO) 20 MG TABS tablet Take 1  tablet (20 mg total) by mouth daily with supper. 30 tablet 0   traZODone (DESYREL) 50 MG tablet Take 1 tablet (50 mg total) by mouth at bedtime as needed for sleep. 30 tablet 0   cephALEXin (KEFLEX) 500 MG capsule Take 1 capsule (500 mg total) by mouth 4 (four) times daily. (Patient not taking: Reported on 07/11/2022) 20 capsule 0    Labs  Lab Results:  Admission on 07/17/2022  Component Date Value Ref Range Status   SARS Coronavirus 2 by RT PCR 07/17/2022 NEGATIVE  NEGATIVE Final   Comment: (NOTE) SARS-CoV-2 target nucleic acids are NOT DETECTED.  The SARS-CoV-2 RNA is generally detectable in upper respiratory specimens during the acute phase of infection. The lowest concentration of SARS-CoV-2 viral copies this assay can detect is 138 copies/mL. A negative result does not preclude SARS-Cov-2 infection and should not be used as the sole basis for treatment or other patient management decisions. A negative result may occur with  improper specimen collection/handling, submission of specimen other than nasopharyngeal swab, presence of viral mutation(s) within the areas targeted by this assay, and inadequate number of viral copies(<138 copies/mL). A negative result must be combined with clinical observations, patient history, and epidemiological information. The expected result is Negative.  Fact Sheet for Patients:  BloggerCourse.com  Fact Sheet for Healthcare Providers:  SeriousBroker.it  This test is no                          t yet approved or cleared by the [place] FDA and  has been authorized for detection and/or diagnosis of SARS-CoV-2 by FDA under an Emergency Use Authorization (EUA). This EUA will remain  in effect (  meaning this test can be used) for the duration of the COVID-19 declaration under Section 564(b)(1) of the Act, 21 U.S.C.section 360bbb-3(b)(1), unless the authorization is terminated  or revoked sooner.        Influenza A by PCR 07/17/2022 NEGATIVE  NEGATIVE Final   Influenza B by PCR 07/17/2022 NEGATIVE  NEGATIVE Final   Comment: (NOTE) The Xpert Xpress SARS-CoV-2/FLU/RSV plus assay is intended as an aid in the diagnosis of influenza from Nasopharyngeal swab specimens and should not be used as a sole basis for treatment. Nasal washings and aspirates are unacceptable for Xpert Xpress SARS-CoV-2/FLU/RSV testing.  Fact Sheet for Patients: BloggerCourse.com  Fact Sheet for Healthcare Providers: SeriousBroker.it  This test is not yet approved or cleared by the [place] FDA and has been authorized for detection and/or diagnosis of SARS-CoV-2 by FDA under an Emergency Use Authorization (EUA). This EUA will remain in effect (meaning this test can be used) for the duration of the COVID-19 declaration under Section 564(b)(1) of the Act, 21 U.S.C. section 360bbb-3(b)(1), unless the authorization is terminated or revoked.     Resp Syncytial Virus by PCR 07/17/2022 NEGATIVE  NEGATIVE Final   Comment: (NOTE) Fact Sheet for Patients: BloggerCourse.com  Fact Sheet for Healthcare Providers: SeriousBroker.it  This test is not yet approved or cleared by the [place] FDA and has been authorized for detection and/or diagnosis of SARS-CoV-2 by FDA under an Emergency Use Authorization (EUA). This EUA will remain in effect (meaning this test can be used) for the duration of the COVID-19 declaration under Section 564(b)(1) of the Act, 21 U.S.C. section 360bbb-3(b)(1), unless the authorization is terminated or revoked.  Performed at [facility], [address] [place], [place] [address]    WBC 07/17/2022 8.9  4.0 - 10.5 K/uL Final   RBC 07/17/2022 4.64  3.87 - 5.11 MIL/uL Final   Hemoglobin 07/17/2022 14.2  12.0 - 15.0 g/dL Final   HCT 40/98/1191 41.6  36.0 - 46.0 % Final    MCV 07/17/2022 89.7  80.0 - 100.0 fL Final   MCH 07/17/2022 30.6  26.0 - 34.0 pg Final   MCHC 07/17/2022 34.1  30.0 - 36.0 g/dL Final   RDW 47/82/9562 13.7  11.5 - 15.5 % Final   Platelets 07/17/2022 306  150 - 400 K/uL Final   nRBC 07/17/2022 0.0  0.0 - 0.2 % Final   Neutrophils Relative % 07/17/2022 59  % Final   Neutro Abs 07/17/2022 5.2  1.7 - 7.7 K/uL Final   Lymphocytes Relative 07/17/2022 35  % Final   Lymphs Abs 07/17/2022 3.1  0.7 - 4.0 K/uL Final   Monocytes Relative 07/17/2022 5  % Final   Monocytes Absolute 07/17/2022 0.5  0.1 - 1.0 K/uL Final   Eosinophils Relative 07/17/2022 1  % Final   Eosinophils Absolute 07/17/2022 0.1  0.0 - 0.5 K/uL Final   Basophils Relative 07/17/2022 0  % Final   Basophils Absolute 07/17/2022 0.0  0.0 - 0.1 K/uL Final   Immature Granulocytes 07/17/2022 0  % Final   Abs Immature Granulocytes 07/17/2022 0.04  0.00 - 0.07 K/uL Final   Performed at [facility], [address] [place], [place] [address]   Sodium 07/17/2022 139  135 - 145 mmol/L Final   Potassium 07/17/2022 3.7  3.5 - 5.1 mmol/L Final   Chloride 07/17/2022 104  98 - 111 mmol/L Final   CO2 07/17/2022 24  22 - 32 mmol/L Final   Glucose, Bld  07/17/2022 51 (L)  70 - 99 mg/dL Final   Glucose reference range applies only to samples taken after fasting for at least 8 hours.   BUN 07/17/2022 8  6 - 20 mg/dL Final   Creatinine, Ser 07/17/2022 0.69  0.44 - 1.00 mg/dL Final   Calcium 16/10/960412/14/2023 8.8 (L)  8.9 - 10.3 mg/dL Final   Total Protein 54/09/811912/14/2023 6.2 (L)  6.5 - 8.1 g/dL Final   Albumin 14/78/295612/14/2023 3.6  3.5 - 5.0 g/dL Final   AST 21/30/865712/14/2023 23  15 - 41 U/L Final   ALT 07/17/2022 19  0 - 44 U/L Final   Alkaline Phosphatase 07/17/2022 82  38 - 126 U/L Final   Total Bilirubin 07/17/2022 0.6  0.3 - 1.2 mg/dL Final   GFR, Estimated 07/17/2022 >60  >60 mL/min Final   Comment: (NOTE) Calculated using the CKD-EPI Creatinine Equation (2021)    Anion gap 07/17/2022 11  5 - 15 Final    Performed at [facility], [address] [place], [place] [address]   Alcohol, Ethyl (B) 07/17/2022 37 (H)  <10 mg/dL Final   Comment: (NOTE) Lowest detectable limit for serum alcohol is 10 mg/dL.  For medical purposes only. Performed at [facility], [address] [place], [place] [address]    Neisseria Gonorrhea 07/17/2022 Negative   Final   Chlamydia 07/17/2022 Positive (A)   Final   Comment 07/17/2022 Normal Reference Ranger Chlamydia - Negative   Final   Comment 07/17/2022 Normal Reference Range Neisseria Gonorrhea - Negative   Final   POC Amphetamine UR 07/17/2022 None Detected  NONE DETECTED (Cut Off Level 1000 ng/mL) Final   POC Secobarbital (BAR) 07/17/2022 None Detected  NONE DETECTED (Cut Off Level 300 ng/mL) Final   POC Buprenorphine (BUP) 07/17/2022 None Detected  NONE DETECTED (Cut Off Level 10 ng/mL) Final   POC Oxazepam (BZO) 07/17/2022 Positive (A)  NONE DETECTED (Cut Off Level 300 ng/mL) Final   POC Cocaine UR 07/17/2022 None Detected  NONE DETECTED (Cut Off Level 300 ng/mL) Final   POC Methamphetamine UR 07/17/2022 None Detected  NONE DETECTED (Cut Off Level 1000 ng/mL) Final   POC Morphine 07/17/2022 None Detected  NONE DETECTED (Cut Off Level 300 ng/mL) Final   POC Methadone UR 07/17/2022 None Detected  NONE DETECTED (Cut Off Level 300 ng/mL) Final   POC Oxycodone UR 07/17/2022 None Detected  NONE DETECTED (Cut Off Level 100 ng/mL) Final   POC Marijuana UR 07/17/2022 None Detected  NONE DETECTED (Cut Off Level 50 ng/mL) Final   Preg Test, Ur 07/17/2022 Negative  Negative Final   RPR Ser Ql 07/17/2022 NON REACTIVE  NON REACTIVE Final   Performed at [facility], [address] [place], [place] [address]   HIV Screen 4th Generation wRfx 07/17/2022 Non Reactive  Non Reactive Final   Performed at [facility], [address] [place], [place] [address]   SARSCOV2ONAVIRUS 2 AG 07/17/2022 NEGATIVE  NEGATIVE Final   Comment: (NOTE) SARS-CoV-2  antigen NOT DETECTED.   Negative results are presumptive.  Negative results do not preclude SARS-CoV-2 infection and should not be used as the sole basis for treatment or other patient management decisions, including infection  control decisions, particularly in the presence of clinical signs and  symptoms consistent with COVID-19, or in those who have been in contact with the virus.  Negative results must be combined with clinical observations, patient history, and epidemiological information. The expected result is  Negative.  Fact Sheet for Patients: https://www.jennings-kim.com/  Fact Sheet for Healthcare Providers: https://alexander-rogers.biz/  This test is not yet approved or cleared by the [place] FDA and  has been authorized for detection and/or diagnosis of SARS-CoV-2 by FDA under an Emergency Use Authorization (EUA).  This EUA will remain in effect (meaning this test can be used) for the duration of  the COV                          ID-19 declaration under Section 564(b)(1) of the Act, 21 U.S.C. section 360bbb-3(b)(1), unless the authorization is terminated or revoked sooner.     Preg Test, Ur 07/17/2022 NEGATIVE  NEGATIVE Final   Comment:        THE SENSITIVITY OF THIS METHODOLOGY IS >24 mIU/mL   Admission on 07/14/2022, Discharged on 07/14/2022  Component Date Value Ref Range Status   SARS Coronavirus 2 by RT PCR 07/14/2022 NEGATIVE  NEGATIVE Final   Comment: (NOTE) SARS-CoV-2 target nucleic acids are NOT DETECTED.  The SARS-CoV-2 RNA is generally detectable in upper respiratory specimens during the acute phase of infection. The lowest concentration of SARS-CoV-2 viral copies this assay can detect is 138 copies/mL. A negative result does not preclude SARS-Cov-2 infection and should not be used as the sole basis for treatment or other patient management decisions. A negative result may occur with  improper specimen  collection/handling, submission of specimen other than nasopharyngeal swab, presence of viral mutation(s) within the areas targeted by this assay, and inadequate number of viral copies(<138 copies/mL). A negative result must be combined with clinical observations, patient history, and epidemiological information. The expected result is Negative.  Fact Sheet for Patients:  BloggerCourse.com  Fact Sheet for Healthcare Providers:  SeriousBroker.it  This test is no                          t yet approved or cleared by the [place] FDA and  has been authorized for detection and/or diagnosis of SARS-CoV-2 by FDA under an Emergency Use Authorization (EUA). This EUA will remain  in effect (meaning this test can be used) for the duration of the COVID-19 declaration under Section 564(b)(1) of the Act, 21 U.S.C.section 360bbb-3(b)(1), unless the authorization is terminated  or revoked sooner.       Influenza A by PCR 07/14/2022 NEGATIVE  NEGATIVE Final   Influenza B by PCR 07/14/2022 NEGATIVE  NEGATIVE Final   Comment: (NOTE) The Xpert Xpress SARS-CoV-2/FLU/RSV plus assay is intended as an aid in the diagnosis of influenza from Nasopharyngeal swab specimens and should not be used as a sole basis for treatment. Nasal washings and aspirates are unacceptable for Xpert Xpress SARS-CoV-2/FLU/RSV testing.  Fact Sheet for Patients: BloggerCourse.com  Fact Sheet for Healthcare Providers: SeriousBroker.it  This test is not yet approved or cleared by the [place] FDA and has been authorized for detection and/or diagnosis of SARS-CoV-2 by FDA under an Emergency Use Authorization (EUA). This EUA will remain in effect (meaning this test can be used) for the duration of the COVID-19 declaration under Section 564(b)(1) of the Act, 21 U.S.C. section 360bbb-3(b)(1), unless the authorization is  terminated or revoked.  Performed at [facility], [address], [place], [place] [address]   Admission on 07/11/2022, Discharged on 07/11/2022  Component Date Value Ref Range Status   WBC 07/11/2022 9.7  4.0 - 10.5 K/uL Final   RBC  07/11/2022 5.26 (H)  3.87 - 5.11 MIL/uL Final   Hemoglobin 07/11/2022 15.9 (H)  12.0 - 15.0 g/dL Final   HCT 16/05/9603 48.3 (H)  36.0 - 46.0 % Final   MCV 07/11/2022 91.8  80.0 - 100.0 fL Final   MCH 07/11/2022 30.2  26.0 - 34.0 pg Final   MCHC 07/11/2022 32.9  30.0 - 36.0 g/dL Final   RDW 54/04/8118 13.5  11.5 - 15.5 % Final   Platelets 07/11/2022 282  150 - 400 K/uL Final   nRBC 07/11/2022 0.0  0.0 - 0.2 % Final   Neutrophils Relative % 07/11/2022 63  % Final   Neutro Abs 07/11/2022 6.1  1.7 - 7.7 K/uL Final   Lymphocytes Relative 07/11/2022 29  % Final   Lymphs Abs 07/11/2022 2.8  0.7 - 4.0 K/uL Final   Monocytes Relative 07/11/2022 7  % Final   Monocytes Absolute 07/11/2022 0.7  0.1 - 1.0 K/uL Final   Eosinophils Relative 07/11/2022 1  % Final   Eosinophils Absolute 07/11/2022 0.1  0.0 - 0.5 K/uL Final   Basophils Relative 07/11/2022 0  % Final   Basophils Absolute 07/11/2022 0.0  0.0 - 0.1 K/uL Final   Immature Granulocytes 07/11/2022 0  % Final   Abs Immature Granulocytes 07/11/2022 0.03  0.00 - 0.07 K/uL Final   Performed at [facility], [address], [place], [place] [address]   Sodium 07/11/2022 139  135 - 145 mmol/L Final   Potassium 07/11/2022 3.8  3.5 - 5.1 mmol/L Final   Chloride 07/11/2022 105  98 - 111 mmol/L Final   CO2 07/11/2022 24  22 - 32 mmol/L Final   Glucose, Bld 07/11/2022 90  70 - 99 mg/dL Final   Glucose reference range applies only to samples taken after fasting for at least 8 hours.   BUN 07/11/2022 9  6 - 20 mg/dL Final   Creatinine, Ser 07/11/2022 0.57  0.44 - 1.00 mg/dL Final   Calcium 95/62/1308 8.5 (L)  8.9 - 10.3 mg/dL Final   Total Protein 65/78/4696 6.3 (L)  6.5 - 8.1 g/dL Final    Albumin 29/52/8413 3.6  3.5 - 5.0 g/dL Final   AST 24/40/1027 19  15 - 41 U/L Final   ALT 07/11/2022 16  0 - 44 U/L Final   Alkaline Phosphatase 07/11/2022 78  38 - 126 U/L Final   Total Bilirubin 07/11/2022 0.4  0.3 - 1.2 mg/dL Final   GFR, Estimated 07/11/2022 >60  >60 mL/min Final   Comment: (NOTE) Calculated using the CKD-EPI Creatinine Equation (2021)    Anion gap 07/11/2022 10  5 - 15 Final   Performed at [facility], [address], [place], [place] [address]   Troponin I (High Sensitivity) 07/11/2022 <2  <18 ng/L Final   Comment: (NOTE) Elevated high sensitivity troponin I (hsTnI) values and significant  changes across serial measurements may suggest ACS but many other  chronic and acute conditions are known to elevate hsTnI results.  Refer to the "Links" section for chest pain algorithms and additional  guidance. Performed at [facility], [address], [place], [place] [address]    Lipase 07/11/2022 35  11 - 51 U/L Final   Performed at [facility], [address] [place], [place] [address]   D-Dimer, Quant 07/11/2022 <0.27  0.00 - 0.50 ug/mL-FEU Final   Comment: (NOTE) At the manufacturer cut-off value of 0.5 g/mL FEU, this assay has a negative  predictive value of 95-100%.This assay is intended for use in conjunction with a clinical pretest probability (PTP) assessment model to exclude pulmonary embolism (PE) and deep venous thrombosis (DVT) in outpatients suspected of PE or DVT. Results should be correlated with clinical presentation. Performed at [facility], [address] [place], [place] [address]    Color, Urine 07/11/2022 YELLOW  YELLOW Final   APPearance 07/11/2022 HAZY (A)  CLEAR Final   Specific Gravity, Urine 07/11/2022 1.024  1.005 - 1.030 Final   pH 07/11/2022 6.0  5.0 - 8.0 Final   Glucose, UA 07/11/2022 NEGATIVE  NEGATIVE mg/dL Final   Hgb urine dipstick 07/11/2022 NEGATIVE  NEGATIVE Final   Bilirubin Urine 07/11/2022  NEGATIVE  NEGATIVE Final   Ketones, ur 07/11/2022 NEGATIVE  NEGATIVE mg/dL Final   Protein, ur 93/81/0175 NEGATIVE  NEGATIVE mg/dL Final   Nitrite 05/28/8526 NEGATIVE  NEGATIVE Final   Leukocytes,Ua 07/11/2022 NEGATIVE  NEGATIVE Final   Performed at [facility], [address] [place], [place] [address]   SARS Coronavirus 2 by RT PCR 07/11/2022 NEGATIVE  NEGATIVE Final   Comment: (NOTE) SARS-CoV-2 target nucleic acids are NOT DETECTED.  The SARS-CoV-2 RNA is generally detectable in upper respiratory specimens during the acute phase of infection. The lowest concentration of SARS-CoV-2 viral copies this assay can detect is 138 copies/mL. A negative result does not preclude SARS-Cov-2 infection and should not be used as the sole basis for treatment or other patient management decisions. A negative result may occur with  improper specimen collection/handling, submission of specimen other than nasopharyngeal swab, presence of viral mutation(s) within the areas targeted by this assay, and inadequate number of viral copies(<138 copies/mL). A negative result must be combined with clinical observations, patient history, and epidemiological information. The expected result is Negative.  Fact Sheet for Patients:  BloggerCourse.com  Fact Sheet for Healthcare Providers:  SeriousBroker.it  This test is no                          t yet approved or cleared by the [place] FDA and  has been authorized for detection and/or diagnosis of SARS-CoV-2 by FDA under an Emergency Use Authorization (EUA). This EUA will remain  in effect (meaning this test can be used) for the duration of the COVID-19 declaration under Section 564(b)(1) of the Act, 21 U.S.C.section 360bbb-3(b)(1), unless the authorization is terminated  or revoked sooner.       Influenza A by PCR 07/11/2022 NEGATIVE  NEGATIVE Final   Influenza B by PCR 07/11/2022 NEGATIVE   NEGATIVE Final   Comment: (NOTE) The Xpert Xpress SARS-CoV-2/FLU/RSV plus assay is intended as an aid in the diagnosis of influenza from Nasopharyngeal swab specimens and should not be used as a sole basis for treatment. Nasal washings and aspirates are unacceptable for Xpert Xpress SARS-CoV-2/FLU/RSV testing.  Fact Sheet for Patients: BloggerCourse.com  Fact Sheet for Healthcare Providers: SeriousBroker.it  This test is not yet approved or cleared by the [place] FDA and has been authorized for detection and/or diagnosis of SARS-CoV-2 by FDA under an Emergency Use Authorization (EUA). This EUA will remain in effect (meaning this test can be used) for the duration of the COVID-19 declaration under Section 564(b)(1) of the Act, 21 U.S.C. section 360bbb-3(b)(1), unless the authorization is terminated or revoked.  Performed at [facility], [address], [place], [place] [address]    I-stat hCG, quantitative 07/11/2022 <5.0  <  5 mIU/mL Final   Comment 3 07/11/2022          Final   Comment:   GEST. AGE      CONC.  (mIU/mL)   <=1 WEEK        5 - 50     2 WEEKS       50 - 500     3 WEEKS       100 - 10,000     4 WEEKS     1,000 - 30,000        FEMALE AND NON-PREGNANT FEMALE:     LESS THAN 5 mIU/mL    Troponin I (High Sensitivity) 07/11/2022 2  <18 ng/L Final   Comment: (NOTE) Elevated high sensitivity troponin I (hsTnI) values and significant  changes across serial measurements may suggest ACS but many other  chronic and acute conditions are known to elevate hsTnI results.  Refer to the "Links" section for chest pain algorithms and additional  guidance. Performed at [facility], [address], [place], [place] [address]   Admission on 07/02/2022, Discharged on 07/02/2022  Component Date Value Ref Range Status   Color, Urine 07/02/2022 YELLOW  YELLOW Final   APPearance 07/02/2022 HAZY (A)  CLEAR Final    Specific Gravity, Urine 07/02/2022 1.014  1.005 - 1.030 Final   pH 07/02/2022 5.0  5.0 - 8.0 Final   Glucose, UA 07/02/2022 NEGATIVE  NEGATIVE mg/dL Final   Hgb urine dipstick 07/02/2022 SMALL (A)  NEGATIVE Final   Bilirubin Urine 07/02/2022 NEGATIVE  NEGATIVE Final   Ketones, ur 07/02/2022 NEGATIVE  NEGATIVE mg/dL Final   Protein, ur 60/45/4098 NEGATIVE  NEGATIVE mg/dL Final   Nitrite 11/91/4782 POSITIVE (A)  NEGATIVE Final   Leukocytes,Ua 07/02/2022 LARGE (A)  NEGATIVE Final   RBC / HPF 07/02/2022 6-10  0 - 5 RBC/hpf Final   WBC, UA 07/02/2022 >50 (H)  0 - 5 WBC/hpf Final   Bacteria, UA 07/02/2022 FEW (A)  NONE SEEN Final   Squamous Epithelial / LPF 07/02/2022 6-10  0 - 5 Final   Mucus 07/02/2022 PRESENT   Final   Non Squamous Epithelial 07/02/2022 0-5 (A)  NONE SEEN Final   Performed at [facility], [address] [place], [place] [address]   WBC 07/02/2022 9.9  4.0 - 10.5 K/uL Final   RBC 07/02/2022 5.18 (H)  3.87 - 5.11 MIL/uL Final   Hemoglobin 07/02/2022 15.8 (H)  12.0 - 15.0 g/dL Final   HCT 30/86/5784 48.5 (H)  36.0 - 46.0 % Final   MCV 07/02/2022 93.6  80.0 - 100.0 fL Final   MCH 07/02/2022 30.5  26.0 - 34.0 pg Final   MCHC 07/02/2022 32.6  30.0 - 36.0 g/dL Final   RDW 69/62/9528 13.5  11.5 - 15.5 % Final   Platelets 07/02/2022 249  150 - 400 K/uL Final   nRBC 07/02/2022 0.0  0.0 - 0.2 % Final   Performed at [facility], [address], [place], [place] [address]   Sodium 07/02/2022 138  135 - 145 mmol/L Final   Potassium 07/02/2022 3.9  3.5 - 5.1 mmol/L Final   Chloride 07/02/2022 105  98 - 111 mmol/L Final   CO2 07/02/2022 26  22 - 32 mmol/L Final   Glucose, Bld 07/02/2022 114 (H)  70 - 99 mg/dL Final   Glucose reference range applies only to samples taken after fasting for at least 8 hours.   BUN 07/02/2022 9  6 - 20 mg/dL Final   Creatinine, Ser 07/02/2022 0.64  0.44 - 1.00 mg/dL Final   Calcium 16/05/9603 8.9  8.9 - 10.3 mg/dL  Final   GFR, Estimated 07/02/2022 >60  >60 mL/min Final   Comment: (NOTE) Calculated using the CKD-EPI Creatinine Equation (2021)    Anion gap 07/02/2022 7  5 - 15 Final   Performed at [facility], [address] [place], [place] [address]   Lipase 07/02/2022 35  11 - 51 U/L Final   Performed at [facility], [address] [place], [place] [address]   Total Protein 07/02/2022 6.9  6.5 - 8.1 g/dL Final   Albumin 78/29/5621 3.8  3.5 - 5.0 g/dL Final   AST 30/86/5784 17  15 - 41 U/L Final   ALT 07/02/2022 16  0 - 44 U/L Final   Alkaline Phosphatase 07/02/2022 91  38 - 126 U/L Final   Total Bilirubin 07/02/2022 0.9  0.3 - 1.2 mg/dL Final   Bilirubin, Direct 07/02/2022 0.1  0.0 - 0.2 mg/dL Final   Indirect Bilirubin 07/02/2022 0.8  0.3 - 0.9 mg/dL Final   Performed at [facility], [address], [place], [place] [address]  Admission on 06/06/2022, Discharged on 06/06/2022  Component Date Value Ref Range Status   Sodium 06/06/2022 140  135 - 145 mmol/L Final   Potassium 06/06/2022 3.2 (L)  3.5 - 5.1 mmol/L Final   Chloride 06/06/2022 103  98 - 111 mmol/L Final   CO2 06/06/2022 26  22 - 32 mmol/L Final   Glucose, Bld 06/06/2022 97  70 - 99 mg/dL Final   Glucose reference range applies only to samples taken after fasting for at least 8 hours.   BUN 06/06/2022 6  6 - 20 mg/dL Final   Creatinine, Ser 06/06/2022 0.54  0.44 - 1.00 mg/dL Final   Calcium 52/84/1324 9.0  8.9 - 10.3 mg/dL Final   Total Protein 40/05/2724 6.5  6.5 - 8.1 g/dL Final   Albumin 36/64/4034 3.6  3.5 - 5.0 g/dL Final   AST 74/25/9563 20  15 - 41 U/L Final   ALT 06/06/2022 17  0 - 44 U/L Final   Alkaline Phosphatase 06/06/2022 111  38 - 126 U/L Final   Total Bilirubin 06/06/2022 0.3  0.3 - 1.2 mg/dL Final   GFR, Estimated 06/06/2022 >60  >60 mL/min Final   Comment: (NOTE) Calculated using the CKD-EPI Creatinine Equation (2021)    Anion gap 06/06/2022 11  5  - 15 Final   Performed at [facility], [address] [place], [place] [address]   Alcohol, Ethyl (B) 06/06/2022 18 (H)  <10 mg/dL Final   Comment: (NOTE) Lowest detectable limit for serum alcohol is 10 mg/dL.  For medical purposes only. Performed at [facility], [address], [place], [place] [address]    WBC 06/06/2022 6.0  4.0 - 10.5 K/uL Final   RBC 06/06/2022 4.76  3.87 - 5.11 MIL/uL Final   Hemoglobin 06/06/2022 14.5  12.0 - 15.0 g/dL Final   HCT 18/84/1660 43.9  36.0 - 46.0 % Final   MCV 06/06/2022 92.2  80.0 - 100.0 fL Final   MCH 06/06/2022 30.5  26.0 - 34.0 pg Final   MCHC 06/06/2022 33.0  30.0 - 36.0 g/dL Final   RDW 63/08/6008 13.5  11.5 - 15.5 % Final   Platelets 06/06/2022 224  150 - 400 K/uL Final   nRBC 06/06/2022 0.0  0.0 - 0.2 % Final   Performed at [facility], [address] [place], [place] [address]   I-stat hCG, quantitative 06/06/2022 <5.0  <5 mIU/mL Final   Comment 3 06/06/2022          Final   Comment:   GEST. AGE      CONC.  (mIU/mL)   <=1 WEEK        5 - 50     2 WEEKS       50 - 500     3 WEEKS       100 - 10,000     4 WEEKS     1,000 - 30,000        FEMALE AND NON-PREGNANT FEMALE:     LESS THAN 5 mIU/mL   Admission on 06/05/2022, Discharged on 06/05/2022  Component Date Value Ref Range Status   Sodium 06/05/2022 139  135 - 145 mmol/L Final   Potassium 06/05/2022 3.7  3.5 - 5.1 mmol/L Final   Chloride 06/05/2022 105  98 - 111 mmol/L Final   CO2 06/05/2022 26  22 - 32 mmol/L Final   Glucose, Bld 06/05/2022 114 (H)  70 - 99 mg/dL Final   Glucose reference range applies only to samples taken after fasting for at least 8 hours.   BUN 06/05/2022 8  6 - 20 mg/dL Final   Creatinine, Ser 06/05/2022 0.50  0.44 - 1.00 mg/dL Final   Calcium 60/45/4098 8.8 (L)  8.9 - 10.3 mg/dL Final   Total Protein 11/91/4782 7.1  6.5 - 8.1 g/dL Final   Albumin 95/62/1308 3.9  3.5 - 5.0 g/dL Final   AST 65/78/4696 20  15 - 41 U/L Final   ALT  06/05/2022 17  0 - 44 U/L Final   Alkaline Phosphatase 06/05/2022 120  38 - 126 U/L Final   Total Bilirubin 06/05/2022 0.5  0.3 - 1.2 mg/dL Final   GFR, Estimated 06/05/2022 >60  >60 mL/min Final   Comment: (NOTE) Calculated using the CKD-EPI Creatinine Equation (2021)    Anion gap 06/05/2022 8  5 - 15 Final   Performed at [facility], [address] [place], [place] [address]   WBC 06/05/2022 4.9  4.0 - 10.5 K/uL Final   RBC 06/05/2022 5.00  3.87 - 5.11 MIL/uL Final   Hemoglobin 06/05/2022 15.1 (H)  12.0 - 15.0 g/dL Final   HCT 41/32/4401 46.0  36.0 - 46.0 % Final   MCV 06/05/2022 92.0  80.0 - 100.0 fL Final   MCH 06/05/2022 30.2  26.0 - 34.0 pg Final   MCHC 06/05/2022 32.8  30.0 - 36.0 g/dL Final   RDW 02/72/5366 13.6  11.5 - 15.5 % Final   Platelets 06/05/2022 248  150 - 400 K/uL Final   nRBC 06/05/2022 0.0  0.0 - 0.2 % Final   Performed at [facility], [address] [place], [place] [address]   Opiates 06/05/2022 NONE DETECTED  NONE DETECTED Final   Cocaine 06/05/2022 NONE DETECTED  NONE DETECTED Final   Benzodiazepines 06/05/2022 NONE DETECTED  NONE DETECTED Final   Amphetamines 06/05/2022 POSITIVE (A)  NONE DETECTED Final   Tetrahydrocannabinol 06/05/2022 NONE DETECTED  NONE DETECTED Final   Barbiturates 06/05/2022 NONE DETECTED  NONE DETECTED Final   Comment: (NOTE) DRUG SCREEN FOR MEDICAL PURPOSES ONLY.  IF CONFIRMATION IS NEEDED FOR ANY PURPOSE, NOTIFY LAB WITHIN 5 DAYS.  LOWEST DETECTABLE LIMITS FOR URINE DRUG SCREEN Drug Class  Cutoff (ng/mL) Amphetamine and metabolites    1000 Barbiturate and metabolites    200 Benzodiazepine                 200 Opiates and metabolites        300 Cocaine and metabolites        300 THC                            50 Performed at [facility], [address], [place], [place] [address]    Alcohol, Ethyl (B) 06/05/2022 <10  <10 mg/dL Final   Comment:  (NOTE) Lowest detectable limit for serum alcohol is 10 mg/dL.  For medical purposes only. Performed at [facility], [address], [place], [place] [address]   Admission on 05/10/2022, Discharged on 05/10/2022  Component Date Value Ref Range Status   Opiates 05/10/2022 NONE DETECTED  NONE DETECTED Final   Cocaine 05/10/2022 POSITIVE (A)  NONE DETECTED Final   Benzodiazepines 05/10/2022 NONE DETECTED  NONE DETECTED Final   Amphetamines 05/10/2022 NONE DETECTED  NONE DETECTED Final   Tetrahydrocannabinol 05/10/2022 NONE DETECTED  NONE DETECTED Final   Barbiturates 05/10/2022 NONE DETECTED  NONE DETECTED Final   Comment: (NOTE) DRUG SCREEN FOR MEDICAL PURPOSES ONLY.  IF CONFIRMATION IS NEEDED FOR ANY PURPOSE, NOTIFY LAB WITHIN 5 DAYS.  LOWEST DETECTABLE LIMITS FOR URINE DRUG SCREEN Drug Class                     Cutoff (ng/mL) Amphetamine and metabolites    1000 Barbiturate and metabolites    200 Benzodiazepine                 200 Tricyclics and metabolites     300 Opiates and metabolites        300 Cocaine and metabolites        300 THC                            50 Performed at [facility], [address] [place], [place] [address]    Alcohol, Ethyl (B) 05/10/2022 <10  <10 mg/dL Final   Comment: (NOTE) Lowest detectable limit for serum alcohol is 10 mg/dL.  For medical purposes only. Performed at [facility], [address] [place], [place] [address]    Sodium 05/10/2022 137  135 - 145 mmol/L Final   Potassium 05/10/2022 4.2  3.5 - 5.1 mmol/L Final   Chloride 05/10/2022 102  98 - 111 mmol/L Final   CO2 05/10/2022 29  22 - 32 mmol/L Final   Glucose, Bld 05/10/2022 90  70 - 99 mg/dL Final   Glucose reference range applies only to samples taken after fasting for at least 8 hours.   BUN 05/10/2022 11  6 - 20 mg/dL Final   Creatinine, Ser 05/10/2022 0.57  0.44 - 1.00 mg/dL Final   Calcium 29/56/2130 8.7 (L)   8.9 - 10.3 mg/dL Final   Total Protein 86/57/8469 7.2  6.5 - 8.1 g/dL Final   Albumin 62/95/2841 3.7  3.5 - 5.0 g/dL Final   AST 32/44/0102 18  15 - 41 U/L Final   ALT 05/10/2022 16  0 - 44 U/L Final   Alkaline Phosphatase 05/10/2022 97  38 - 126 U/L Final   Total Bilirubin 05/10/2022 0.5  0.3 - 1.2 mg/dL Final  GFR, Estimated 05/10/2022 >60  >60 mL/min Final   Comment: (NOTE) Calculated using the CKD-EPI Creatinine Equation (2021)    Anion gap 05/10/2022 6  5 - 15 Final   Performed at [facility], [address], [place], [place] [address]   WBC 05/10/2022 6.4  4.0 - 10.5 K/uL Final   RBC 05/10/2022 5.56 (H)  3.87 - 5.11 MIL/uL Final   Hemoglobin 05/10/2022 16.3 (H)  12.0 - 15.0 g/dL Final   HCT 47/82/9562 50.7 (H)  36.0 - 46.0 % Final   MCV 05/10/2022 91.2  80.0 - 100.0 fL Final   MCH 05/10/2022 29.3  26.0 - 34.0 pg Final   MCHC 05/10/2022 32.1  30.0 - 36.0 g/dL Final   RDW 13/03/6577 13.2  11.5 - 15.5 % Final   Platelets 05/10/2022 250  150 - 400 K/uL Final   nRBC 05/10/2022 0.0  0.0 - 0.2 % Final   Neutrophils Relative % 05/10/2022 49  % Final   Neutro Abs 05/10/2022 3.2  1.7 - 7.7 K/uL Final   Lymphocytes Relative 05/10/2022 40  % Final   Lymphs Abs 05/10/2022 2.6  0.7 - 4.0 K/uL Final   Monocytes Relative 05/10/2022 8  % Final   Monocytes Absolute 05/10/2022 0.5  0.1 - 1.0 K/uL Final   Eosinophils Relative 05/10/2022 1  % Final   Eosinophils Absolute 05/10/2022 0.0  0.0 - 0.5 K/uL Final   Basophils Relative 05/10/2022 1  % Final   Basophils Absolute 05/10/2022 0.0  0.0 - 0.1 K/uL Final   Immature Granulocytes 05/10/2022 1  % Final   Abs Immature Granulocytes 05/10/2022 0.04  0.00 - 0.07 K/uL Final   Performed at [facility], [address] [place], [place] [address]   Lipase 05/10/2022 34  11 - 51 U/L Final   Performed at [facility], [address], [place], [place] [address]  Admission on 05/08/2022,  Discharged on 05/09/2022  Component Date Value Ref Range Status   SARS Coronavirus 2 by RT PCR 05/08/2022 NEGATIVE  NEGATIVE Final   Comment: (NOTE) SARS-CoV-2 target nucleic acids are NOT DETECTED.  The SARS-CoV-2 RNA is generally detectable in upper respiratory specimens during the acute phase of infection. The lowest concentration of SARS-CoV-2 viral copies this assay can detect is 138 copies/mL. A negative result does not preclude SARS-Cov-2 infection and should not be used as the sole basis for treatment or other patient management decisions. A negative result may occur with  improper specimen collection/handling, submission of specimen other than nasopharyngeal swab, presence of viral mutation(s) within the areas targeted by this assay, and inadequate number of viral copies(<138 copies/mL). A negative result must be combined with clinical observations, patient history, and epidemiological information. The expected result is Negative.  Fact Sheet for Patients:  BloggerCourse.com  Fact Sheet for Healthcare Providers:  SeriousBroker.it  This test is no                          t yet approved or cleared by the [place] FDA and  has been authorized for detection and/or diagnosis of SARS-CoV-2 by FDA under an Emergency Use Authorization (EUA). This EUA will remain  in effect (meaning this test can be used) for the duration of the COVID-19 declaration under Section 564(b)(1) of the Act, 21 U.S.C.section 360bbb-3(b)(1), unless the authorization is terminated  or revoked sooner.       Influenza A by PCR 05/08/2022 NEGATIVE  NEGATIVE Final   Influenza B by PCR 05/08/2022 NEGATIVE  NEGATIVE Final   Comment: (NOTE) The Xpert Xpress SARS-CoV-2/FLU/RSV plus assay is intended as an aid in the diagnosis of influenza from Nasopharyngeal swab specimens and should not be used as a sole basis for treatment. Nasal washings and aspirates  are unacceptable for Xpert Xpress SARS-CoV-2/FLU/RSV testing.  Fact Sheet for Patients: BloggerCourse.com  Fact Sheet for Healthcare Providers: SeriousBroker.it  This test is not yet approved or cleared by the [place] FDA and has been authorized for detection and/or diagnosis of SARS-CoV-2 by FDA under an Emergency Use Authorization (EUA). This EUA will remain in effect (meaning this test can be used) for the duration of the COVID-19 declaration under Section 564(b)(1) of the Act, 21 U.S.C. section 360bbb-3(b)(1), unless the authorization is terminated or revoked.  Performed at [facility], [address] [place], [place] [address]    WBC 05/08/2022 6.5  4.0 - 10.5 K/uL Final   RBC 05/08/2022 5.36 (H)  3.87 - 5.11 MIL/uL Final   Hemoglobin 05/08/2022 16.1 (H)  12.0 - 15.0 g/dL Final   HCT 60/45/4098 47.9 (H)  36.0 - 46.0 % Final   MCV 05/08/2022 89.4  80.0 - 100.0 fL Final   MCH 05/08/2022 30.0  26.0 - 34.0 pg Final   MCHC 05/08/2022 33.6  30.0 - 36.0 g/dL Final   RDW 11/91/4782 13.2  11.5 - 15.5 % Final   Platelets 05/08/2022 237  150 - 400 K/uL Final   nRBC 05/08/2022 0.0  0.0 - 0.2 % Final   Neutrophils Relative % 05/08/2022 37  % Final   Neutro Abs 05/08/2022 2.4  1.7 - 7.7 K/uL Final   Lymphocytes Relative 05/08/2022 52  % Final   Lymphs Abs 05/08/2022 3.4  0.7 - 4.0 K/uL Final   Monocytes Relative 05/08/2022 8  % Final   Monocytes Absolute 05/08/2022 0.5  0.1 - 1.0 K/uL Final   Eosinophils Relative 05/08/2022 1  % Final   Eosinophils Absolute 05/08/2022 0.1  0.0 - 0.5 K/uL Final   Basophils Relative 05/08/2022 1  % Final   Basophils Absolute 05/08/2022 0.0  0.0 - 0.1 K/uL Final   Immature Granulocytes 05/08/2022 1  % Final   Abs Immature Granulocytes 05/08/2022 0.05  0.00 - 0.07 K/uL Final   Performed at [facility], [address] [place], [place] [address]   Sodium 05/08/2022 135  135 - 145  mmol/L Final   Potassium 05/08/2022 3.9  3.5 - 5.1 mmol/L Final   Chloride 05/08/2022 100  98 - 111 mmol/L Final   CO2 05/08/2022 27  22 - 32 mmol/L Final   Glucose, Bld 05/08/2022 93  70 - 99 mg/dL Final   Glucose reference range applies only to samples taken after fasting for at least 8 hours.   BUN 05/08/2022 8  6 - 20 mg/dL Final   Creatinine, Ser 05/08/2022 0.68  0.44 - 1.00 mg/dL Final   Calcium 30/86/5784 9.0  8.9 - 10.3 mg/dL Final   Total Protein 69/62/9528 6.6  6.5 - 8.1 g/dL Final   Albumin 41/32/4401 3.7  3.5 - 5.0 g/dL Final   AST 02/72/5366 17  15 - 41 U/L Final   ALT 05/08/2022 17  0 - 44 U/L Final   Alkaline Phosphatase 05/08/2022 111  38 - 126 U/L Final   Total Bilirubin 05/08/2022 0.6  0.3 - 1.2 mg/dL Final   GFR, Estimated 05/08/2022 >60  >60 mL/min Final  Comment: (NOTE) Calculated using the CKD-EPI Creatinine Equation (2021)    Anion gap 05/08/2022 8  5 - 15 Final   Performed at [facility], [address], [place], [place] [address]   Hgb A1c MFr Bld 05/08/2022 4.9  4.8 - 5.6 % Final   Comment: (NOTE) Pre diabetes:          5.7%-6.4%  Diabetes:              >6.4%  Glycemic control for   <7.0% adults with diabetes    Mean Plasma Glucose 05/08/2022 93.93  mg/dL Final   Performed at [facility], [address], [place], [place] [address]   Preg Test, Ur 05/08/2022 NEGATIVE  NEGATIVE Final   Performed at [facility], [address] [place], [place] [address]   POC Amphetamine UR 05/08/2022 None Detected  NONE DETECTED (Cut Off Level 1000 ng/mL) Final   POC Secobarbital (BAR) 05/08/2022 None Detected  NONE DETECTED (Cut Off Level 300 ng/mL) Final   POC Buprenorphine (BUP) 05/08/2022 None Detected  NONE DETECTED (Cut Off Level 10 ng/mL) Final   POC Oxazepam (BZO) 05/08/2022 None Detected  NONE DETECTED (Cut Off Level 300 ng/mL) Final   POC Cocaine UR 05/08/2022 None Detected  NONE DETECTED (Cut Off Level 300 ng/mL) Final   POC  Methamphetamine UR 05/08/2022 Positive (A)  NONE DETECTED (Cut Off Level 1000 ng/mL) Final   POC Morphine 05/08/2022 None Detected  NONE DETECTED (Cut Off Level 300 ng/mL) Final   POC Methadone UR 05/08/2022 None Detected  NONE DETECTED (Cut Off Level 300 ng/mL) Final   POC Oxycodone UR 05/08/2022 None Detected  NONE DETECTED (Cut Off Level 100 ng/mL) Final   POC Marijuana UR 05/08/2022 None Detected  NONE DETECTED (Cut Off Level 50 ng/mL) Final   SARSCOV2ONAVIRUS 2 AG 05/08/2022 NEGATIVE  NEGATIVE Final   Comment: (NOTE) SARS-CoV-2 antigen NOT DETECTED.   Negative results are presumptive.  Negative results do not preclude SARS-CoV-2 infection and should not be used as the sole basis for treatment or other patient management decisions, including infection  control decisions, particularly in the presence of clinical signs and  symptoms consistent with COVID-19, or in those who have been in contact with the virus.  Negative results must be combined with clinical observations, patient history, and epidemiological information. The expected result is Negative.  Fact Sheet for Patients: https://www.jennings-kim.com/  Fact Sheet for Healthcare Providers: https://alexander-rogers.biz/  This test is not yet approved or cleared by the [place] FDA and  has been authorized for detection and/or diagnosis of SARS-CoV-2 by FDA under an Emergency Use Authorization (EUA).  This EUA will remain in effect (meaning this test can be used) for the duration of  the COV                          ID-19 declaration under Section 564(b)(1) of the Act, 21 U.S.C. section 360bbb-3(b)(1), unless the authorization is terminated or revoked sooner.     Preg Test, Ur 05/08/2022 NEGATIVE  NEGATIVE Final   Comment:        THE SENSITIVITY OF THIS METHODOLOGY IS >24 mIU/mL    Cholesterol 05/08/2022 187  0 - 200 mg/dL Final   Triglycerides 91/47/8295 117  <150 mg/dL Final   HDL  62/13/0865 67  >40 mg/dL Final   Total CHOL/HDL Ratio 05/08/2022 2.8  RATIO Final   VLDL 05/08/2022 23  0 - 40  mg/dL Final   LDL Cholesterol 05/08/2022 97  0 - 99 mg/dL Final   Comment:        Total Cholesterol/HDL:CHD Risk Coronary Heart Disease Risk Table                     Men   Women  1/2 Average Risk   3.4   3.3  Average Risk       5.0   4.4  2 X Average Risk   9.6   7.1  3 X Average Risk  23.4   11.0        Use the calculated Patient Ratio above and the CHD Risk Table to determine the patient's CHD Risk.        ATP III CLASSIFICATION (LDL):  <100     mg/dL   Optimal  161-096  mg/dL   Near or Above                    Optimal  130-159  mg/dL   Borderline  045-409  mg/dL   High  >811     mg/dL   Very High Performed at [facility], [address], [place], [place] [address]    TSH 05/08/2022 3.264  0.350 - 4.500 uIU/mL Final   Comment: Performed by a 3rd Generation assay with a functional sensitivity of <=0.01 uIU/mL. Performed at [facility], [address] [place], [place] [address]   Admission on 03/20/2022, Discharged on 03/20/2022  Component Date Value Ref Range Status   WBC 03/20/2022 9.3  4.0 - 10.5 K/uL Final   RBC 03/20/2022 4.76  3.87 - 5.11 MIL/uL Final   Hemoglobin 03/20/2022 14.2  12.0 - 15.0 g/dL Final   HCT 13/03/6577 44.0  36.0 - 46.0 % Final   MCV 03/20/2022 92.4  80.0 - 100.0 fL Final   MCH 03/20/2022 29.8  26.0 - 34.0 pg Final   MCHC 03/20/2022 32.3  30.0 - 36.0 g/dL Final   RDW 46/96/2952 12.4  11.5 - 15.5 % Final   Platelets 03/20/2022 286  150 - 400 K/uL Final   nRBC 03/20/2022 0.0  0.0 - 0.2 % Final   Neutrophils Relative % 03/20/2022 56  % Final   Neutro Abs 03/20/2022 5.3  1.7 - 7.7 K/uL Final   Lymphocytes Relative 03/20/2022 32  % Final   Lymphs Abs 03/20/2022 3.0  0.7 - 4.0 K/uL Final   Monocytes Relative 03/20/2022 8  % Final   Monocytes Absolute 03/20/2022 0.7  0.1 - 1.0 K/uL Final   Eosinophils Relative 03/20/2022 2  %  Final   Eosinophils Absolute 03/20/2022 0.2  0.0 - 0.5 K/uL Final   Basophils Relative 03/20/2022 1  % Final   Basophils Absolute 03/20/2022 0.1  0.0 - 0.1 K/uL Final   Immature Granulocytes 03/20/2022 1  % Final   Abs Immature Granulocytes 03/20/2022 0.06  0.00 - 0.07 K/uL Final   Performed at [facility], [address] [place], [place] [address]   Sodium 03/20/2022 140  135 - 145 mmol/L Final   Potassium 03/20/2022 4.2  3.5 - 5.1 mmol/L Final   Chloride 03/20/2022 106  98 - 111 mmol/L Final   CO2 03/20/2022 25  22 - 32 mmol/L Final   Glucose, Bld 03/20/2022 89  70 - 99 mg/dL Final   Glucose reference range applies only to samples taken after fasting for at least 8 hours.   BUN 03/20/2022 17  6 - 20 mg/dL Final   Creatinine, Ser 03/20/2022 0.64  0.44 - 1.00 mg/dL Final   Calcium 16/05/9603 9.2  8.9 - 10.3 mg/dL Final   Total Protein 54/04/8118 7.1  6.5 - 8.1 g/dL Final   Albumin 14/78/2956 4.1  3.5 - 5.0 g/dL Final   AST 21/30/8657 18  15 - 41 U/L Final   ALT 03/20/2022 26  0 - 44 U/L Final   Alkaline Phosphatase 03/20/2022 86  38 - 126 U/L Final   Total Bilirubin 03/20/2022 0.4  0.3 - 1.2 mg/dL Final   GFR, Estimated 03/20/2022 >60  >60 mL/min Final   Comment: (NOTE) Calculated using the CKD-EPI Creatinine Equation (2021)    Anion gap 03/20/2022 9  5 - 15 Final   Performed at [facility], [address], [place], [place] [address]   Color, Urine 03/20/2022 STRAW (A)  YELLOW Final   APPearance 03/20/2022 CLEAR  CLEAR Final   Specific Gravity, Urine 03/20/2022 1.008  1.005 - 1.030 Final   pH 03/20/2022 6.0  5.0 - 8.0 Final   Glucose, UA 03/20/2022 NEGATIVE  NEGATIVE mg/dL Final   Hgb urine dipstick 03/20/2022 NEGATIVE  NEGATIVE Final   Bilirubin Urine 03/20/2022 NEGATIVE  NEGATIVE Final   Ketones, ur 03/20/2022 NEGATIVE  NEGATIVE mg/dL Final   Protein, ur 29/52/8413 NEGATIVE  NEGATIVE mg/dL Final   Nitrite 24/40/1027 NEGATIVE  NEGATIVE Final   Leukocytes,Ua  03/20/2022 NEGATIVE  NEGATIVE Final   Performed at [facility], [address], [place], [place] [address]  Admission on 03/10/2022, Discharged on 03/20/2022  Component Date Value Ref Range Status   Magnesium 03/11/2022 1.9  1.7 - 2.4 mg/dL Final   Performed at [facility], [address], [place], [place] [address]   Hepatitis B Surface Ag 03/11/2022 NON REACTIVE  NON REACTIVE Final   HCV Ab 03/11/2022 NON REACTIVE  NON REACTIVE Final   Comment: (NOTE) Nonreactive HCV antibody screen is consistent with no HCV infections,  unless recent infection is suspected or other evidence exists to indicate HCV infection.     Hep A IgM 03/11/2022 NON REACTIVE  NON REACTIVE Final   Hep B C IgM 03/11/2022 NON REACTIVE  NON REACTIVE Final   Performed at [facility], [address], [place], [place] [address]   Vit D, 25-Hydroxy 03/11/2022 31.00  30 - 100 ng/mL Final   Comment: (NOTE) Vitamin D deficiency has been defined by the Institute of Medicine  and an Endocrine Society practice guideline as a level of serum 25-OH  vitamin D less than 20 ng/mL (1,2). The Endocrine Society went on to  further define vitamin D insufficiency as a level between 21 and 29  ng/mL (2).  1. IOM (Institute of Medicine). 2010. Dietary reference intakes for  calcium and D. [place]: [employer]. 2. Holick MF, Binkley [place], Bischoff-Ferrari HA, et al. Evaluation,  treatment, and prevention of vitamin D deficiency: an Endocrine  Society clinical practice guideline, JCEM. 2011 Jul; 96(7): 1911-30.  Performed at [facility], [address], [place], [place] [address]    Vitamin B-12 03/11/2022 201  180 - 914 pg/mL Final   Comment: (NOTE) This assay is not validated for testing neonatal or myeloproliferative syndrome specimens for Vitamin B12 levels. Performed at [facility], [address] [place], [place] [address]    TSH 03/11/2022  3.487  0.350 - 4.500 uIU/mL Final   Comment: Performed by  a 3rd Generation assay with a functional sensitivity of <=0.01 uIU/mL. Performed at [facility], [address] [place], [place] [address]    RPR Ser Ql 03/11/2022 NON REACTIVE  NON REACTIVE Final   Performed at [facility], [address] [place], [place] [address]   Cholesterol 03/13/2022 194  0 - 200 mg/dL Final   Triglycerides 14/78/2956 53  <150 mg/dL Final   HDL 21/30/8657 54  >40 mg/dL Final   Total CHOL/HDL Ratio 03/13/2022 3.6  RATIO Final   VLDL 03/13/2022 11  0 - 40 mg/dL Final   LDL Cholesterol 03/13/2022 129 (H)  0 - 99 mg/dL Final   Comment:        Total Cholesterol/HDL:CHD Risk Coronary Heart Disease Risk Table                     Men   Women  1/2 Average Risk   3.4   3.3  Average Risk       5.0   4.4  2 X Average Risk   9.6   7.1  3 X Average Risk  23.4   11.0        Use the calculated Patient Ratio above and the CHD Risk Table to determine the patient's CHD Risk.        ATP III CLASSIFICATION (LDL):  <100     mg/dL   Optimal  846-962  mg/dL   Near or Above                    Optimal  130-159  mg/dL   Borderline  952-841  mg/dL   High  >324     mg/dL   Very High Performed at [facility], [address], [place], [place] [address]    Hgb A1c MFr Bld 03/13/2022 5.1  4.8 - 5.6 % Final   Comment: (NOTE) Pre diabetes:          5.7%-6.4%  Diabetes:              >6.4%  Glycemic control for   <7.0% adults with diabetes    Mean Plasma Glucose 03/13/2022 99.67  mg/dL Final   Performed at [facility], [address], [place], [place] [address]   Sodium 03/14/2022 138  135 - 145 mmol/L Final   Potassium 03/14/2022 4.3  3.5 - 5.1 mmol/L Final   Chloride 03/14/2022 107  98 - 111 mmol/L Final   CO2 03/14/2022 26  22 - 32 mmol/L Final   Glucose, Bld 03/14/2022 89  70 - 99 mg/dL Final   Glucose reference range applies only to samples taken after fasting  for at least 8 hours.   BUN 03/14/2022 17  6 - 20 mg/dL Final   Creatinine, Ser 03/14/2022 0.65  0.44 - 1.00 mg/dL Final   Calcium 64/40/3474 9.1  8.9 - 10.3 mg/dL Final   Total Protein 25/95/6387 6.5  6.5 - 8.1 g/dL Final   Albumin 56/43/3295 3.6  3.5 - 5.0 g/dL Final   AST 18/84/1660 14 (L)  15 - 41 U/L Final   ALT 03/14/2022 15  0 - 44 U/L Final   Alkaline Phosphatase 03/14/2022 71  38 - 126 U/L Final   Total Bilirubin 03/14/2022 0.3  0.3 - 1.2 mg/dL Final   GFR, Estimated 03/14/2022 >60  >60 mL/min Final   Comment: (NOTE) Calculated using the CKD-EPI Creatinine Equation (2021)    Anion gap  03/14/2022 5  5 - 15 Final   Performed at [facility], [address] [place], [place] [address]   Lipase 03/14/2022 32  11 - 51 U/L Final   Performed at [facility], [address], [place], [place] [address]   WBC 03/14/2022 8.8  4.0 - 10.5 K/uL Final   RBC 03/14/2022 4.37  3.87 - 5.11 MIL/uL Final   Hemoglobin 03/14/2022 13.2  12.0 - 15.0 g/dL Final   HCT 14/78/2956 39.9  36.0 - 46.0 % Final   MCV 03/14/2022 91.3  80.0 - 100.0 fL Final   MCH 03/14/2022 30.2  26.0 - 34.0 pg Final   MCHC 03/14/2022 33.1  30.0 - 36.0 g/dL Final   RDW 21/30/8657 12.4  11.5 - 15.5 % Final   Platelets 03/14/2022 230  150 - 400 K/uL Final   nRBC 03/14/2022 0.0  0.0 - 0.2 % Final   Neutrophils Relative % 03/14/2022 52  % Final   Neutro Abs 03/14/2022 4.7  1.7 - 7.7 K/uL Final   Lymphocytes Relative 03/14/2022 36  % Final   Lymphs Abs 03/14/2022 3.1  0.7 - 4.0 K/uL Final   Monocytes Relative 03/14/2022 9  % Final   Monocytes Absolute 03/14/2022 0.8  0.1 - 1.0 K/uL Final   Eosinophils Relative 03/14/2022 2  % Final   Eosinophils Absolute 03/14/2022 0.2  0.0 - 0.5 K/uL Final   Basophils Relative 03/14/2022 0  % Final   Basophils Absolute 03/14/2022 0.0  0.0 - 0.1 K/uL Final   Immature Granulocytes 03/14/2022 1  % Final   Abs Immature Granulocytes 03/14/2022 0.04  0.00 -  0.07 K/uL Final   Performed at [facility], [address], [place], [place] [address]   Color, Urine 03/14/2022 YELLOW  YELLOW Final   APPearance 03/14/2022 HAZY (A)  CLEAR Final   Specific Gravity, Urine 03/14/2022 1.012  1.005 - 1.030 Final   pH 03/14/2022 6.0  5.0 - 8.0 Final   Glucose, UA 03/14/2022 NEGATIVE  NEGATIVE mg/dL Final   Hgb urine dipstick 03/14/2022 NEGATIVE  NEGATIVE Final   Bilirubin Urine 03/14/2022 NEGATIVE  NEGATIVE Final   Ketones, ur 03/14/2022 NEGATIVE  NEGATIVE mg/dL Final   Protein, ur 29/52/8413 NEGATIVE  NEGATIVE mg/dL Final   Nitrite 24/40/1027 NEGATIVE  NEGATIVE Final   Leukocytes,Ua 03/14/2022 LARGE (A)  NEGATIVE Final   RBC / HPF 03/14/2022 0-5  0 - 5 RBC/hpf Final   WBC, UA 03/14/2022 21-50  0 - 5 WBC/hpf Final   Bacteria, UA 03/14/2022 RARE (A)  NONE SEEN Final   Squamous Epithelial / LPF 03/14/2022 0-5  0 - 5 Final   Performed at [facility], [address] [place], [place] [address]   Preg Test, Ur 03/14/2022 NEGATIVE  NEGATIVE Final   Comment:        THE SENSITIVITY OF THIS METHODOLOGY IS >20 mIU/mL. Performed at [facility], [address], [place], [place] [address]    Specimen Description 03/14/2022    Final                   Value:URINE, CLEAN CATCH Performed at [facility], [address] [place], [place] [address]    Special Requests 03/14/2022    Final                   Value:NONE Performed at [facility], [address], [place], [place] [address]  Culture 03/14/2022 MULTIPLE SPECIES PRESENT, SUGGEST RECOLLECTION (A)   Final   Report Status 03/14/2022 03/16/2022 FINAL   Final  There may be more visits with results that are not included.    Blood Alcohol level:  Lab Results  Component Value Date   ETH 37 (H) 07/17/2022   ETH 18 (H) 06/06/2022    Metabolic Disorder Labs: Lab Results  Component Value Date   HGBA1C 4.9  05/08/2022   MPG 93.93 05/08/2022   MPG 99.67 03/13/2022   No results found for: "PROLACTIN" Lab Results  Component Value Date   CHOL 187 05/08/2022   TRIG 117 05/08/2022   HDL 67 05/08/2022   CHOLHDL 2.8 05/08/2022   VLDL 23 05/08/2022   LDLCALC 97 05/08/2022   LDLCALC 129 (H) 03/13/2022    Therapeutic Lab Levels: No results found for: "LITHIUM" No results found for: "VALPROATE" No results found for: "CBMZ"  Physical Findings   AIMS    Flowsheet Row ED to Hosp-Ad[facility] (Discharged[facility]  AIMS Total Score 0      AUDIT    Flowsheet Row ED to Hosp-Admission (Discharged) [facility]  Alcohol Use Disorder Identification Test Final Score (AUDIT) 32      CAGE-AID    Flowsheet Row ED from 01/17/2022 in [facility]  CAGE-AID Score 1      GAD-7    Flowsheet Row Office Visit from 03/12/2021 in [facility]  Total GAD-7 Score 20      PHQ2-9    Flowsheet Row ED from 07/17/2022 in [facility] ED from 05/08/2022 in [facility] Office Visit from 03/12/2021 in [facility]  PHQ-2 Total Score 6 2 5   PHQ-9 Total Score 22 11 22       Flowsheet Row ED from 07/17/2022 in [facility] Most recent reading at 07/17/2022  6:11 PM ED from 07/15/2022 in [facility] Most recent reading at 07/15/2022  8:29 AM Admission (Pending) from OP [facility] Most recent reading at 07/15/2022 12:36 AM  C-SSRS RISK CATEGORY No Risk No Risk No Risk        Musculoskeletal  Strength & Muscle Tone: within normal limits Gait & Station: normal Patient leans: N/A  Psychiatric Specialty Exam  Presentation  General Appearance:  Appropriate for Environment  Eye  Contact: Fair  Speech: Clear and Coherent  Speech Volume: Normal  Handedness: Right   Mood and Affect  Mood: Depressed  Affect: Congruent   Thought Process  Thought Processes: Linear  Descriptions of Associations:Circumstantial  Orientation:Full (Time, Place and Person)  Thought Content:Logical  Diagnosis of Schizophrenia or Schizoaffective disorder in past: No  Duration of Psychotic Symptoms: Greater than six months   Hallucinations:Hallucinations: None Description of Command Hallucinations: "They tell me what to do, where to go, things like that."  Ideas of Reference:None  Suicidal Thoughts:Suicidal Thoughts: No  Homicidal Thoughts:Homicidal Thoughts: No   Sensorium  Memory: Immediate Fair; Recent Poor  Judgment: Poor  Insight: None   Executive Functions  Concentration: Poor  Attention Span: Fair  Recall: [employer] of Knowledge: Fair  Language: Fair   Psychomotor Activity  Psychomotor Activity: Psychomotor Activity: Normal   Assets  Assets: Resilience   Sleep  Sleep: Sleep: Poor   No data recorded  Physical Exam  Physical Exam  HENT:     Head: Normocephalic and atraumatic.  Pulmonary:     Effort: Pulmonary effort is normal.  Neurological:     Mental Status: She is alert and oriented to person, place, and time.    Review of Systems  Psychiatric/Behavioral:  Positive for depression. Negative for hallucinations and suicidal ideas.    Blood pressure 116/77, pulse 66, temperature 98.5 F (36.9 C), temperature source Oral, resp. rate 19, height 5\' 3"  (1.6 m), weight 110 lb (49.9 kg), SpO2 99 %. Body mass index is 19.49 kg/m.  Treatment Plan Summary: Daily contact with patient to assess and evaluate symptoms and progress in treatment and Medication management  Patient is no longer endorsing SI or AVH since her medication has been restarted. RN's continue to be concerned about patient's fixated on her Ativan, and on  assessment with provider, patient fixates on wanting to know if she has schizophrenia. Patient is also stating opposing things about having no family, but then endorsing that someone may want to know where she is, or she may go attempt to be closer to them. Patient does come off a bit anxious and preoccupied with her worried, with provider having to repeat certain things because patient is so worried about her future plans, medications, and diagnosis. This will likely improve with Abilify and Lexapro that were restarted yesterday. Patient nightmares will continue to be monitored, but may improve with Lexapro as well.    MDD, recurrent, with psychotic features - Continue Lexapro 10 mg daily - Continue Abilify 5 mg daily      #Alcohol use disorder -Continue ativan taper for alcohol withdrawal -Continue CIWA: Last CIWA-0 (07/19/2022) -Continue PRNs:  Vistaril 25 mg po 3 times daily prn for anxiety Zofran 4 mg every 6 hours as needed for nausea or vomiting Imodium 2-4 mg po as needed for diarrhea  Thiamine 100 mg p.o. daily for nutritional supplementation Multivitamin 1 tablet by mouth daily for nutritional supplementation - Start Naltrexone 25mg  daily   #Chlamydia infection Patient informed of positive chlamydia test, treatment, and need for follow-up after treatment. It was also discussed with patient to inform partners of her positive test result and need to get treatment. -Continue doxycycline 100mg  BID x7 days (12/15-12/22)    PGY-3 Bobbye Morton, MD 07/20/2022 12:31 PM

## 2022-07-20 NOTE — ED Notes (Signed)
Patient A&Ox4. Denies intent to harm self/others when asked. Denies A/VH. Patient denies any physical complaints when asked. No acute distress noted. Support and encouragement provided. Routine safety checks conducted according to facility protocol. Encouraged patient to notify staff if thoughts of harm toward self or others arise. Patient verbalize understanding and agreement. Will continue to monitor for safety.    

## 2022-07-20 NOTE — ED Notes (Signed)
Pt was given Breakfast 

## 2022-07-21 DIAGNOSIS — F10230 Alcohol dependence with withdrawal, uncomplicated: Secondary | ICD-10-CM | POA: Diagnosis not present

## 2022-07-21 DIAGNOSIS — F1911 Other psychoactive substance abuse, in remission: Secondary | ICD-10-CM | POA: Diagnosis not present

## 2022-07-21 DIAGNOSIS — Z1152 Encounter for screening for COVID-19: Secondary | ICD-10-CM | POA: Diagnosis not present

## 2022-07-21 MED ORDER — NALTREXONE HCL 50 MG PO TABS: 50.0000 mg | ORAL_TABLET | Freq: Every day | ORAL | 0 refills | Status: DC

## 2022-07-21 MED ORDER — ESCITALOPRAM OXALATE 10 MG PO TABS: 10.0000 mg | ORAL_TABLET | Freq: Every day | ORAL | 0 refills | Status: DC

## 2022-07-21 MED ORDER — NALTREXONE HCL 50 MG PO TABS
50.0000 mg | ORAL_TABLET | Freq: Every day | ORAL | Status: DC
  Filled 2022-07-21: qty 14

## 2022-07-21 MED ORDER — HYDROXYZINE HCL 25 MG PO TABS: 25.0000 mg | ORAL_TABLET | Freq: Three times a day (TID) | ORAL | 0 refills | Status: DC | PRN

## 2022-07-21 MED ORDER — NICOTINE 21 MG/24HR TD PT24: 21.0000 mg | MEDICATED_PATCH | Freq: Every day | TRANSDERMAL | 0 refills | Status: DC

## 2022-07-21 MED ORDER — ARIPIPRAZOLE 5 MG PO TABS
5.0000 mg | ORAL_TABLET | Freq: Once | ORAL | Status: AC
  Administered 2022-07-21: 5 mg via ORAL
  Filled 2022-07-21: qty 1

## 2022-07-21 MED ORDER — ARIPIPRAZOLE 10 MG PO TABS
10.0000 mg | ORAL_TABLET | Freq: Every day | ORAL | Status: DC
  Administered 2022-07-22: 10 mg via ORAL
  Filled 2022-07-21: qty 1
  Filled 2022-07-21: qty 14

## 2022-07-21 MED ORDER — ARIPIPRAZOLE 10 MG PO TABS: 10.0000 mg | ORAL_TABLET | Freq: Every day | ORAL | 0 refills | Status: DC

## 2022-07-21 MED ORDER — DOXYCYCLINE HYCLATE 100 MG PO TABS: 100.0000 mg | ORAL_TABLET | Freq: Two times a day (BID) | ORAL | 0 refills | Status: AC

## 2022-07-21 NOTE — ED Notes (Signed)
Pt sleeping@this time. Breathing even and unlabored. Will continue to monitor for safety 

## 2022-07-21 NOTE — ED Notes (Signed)
Pt sleeping in no acute distress. RR even and unlabored. Environment secured. Will continue to monitor for safety. 

## 2022-07-21 NOTE — ED Provider Notes (Signed)
[facility]BC/OBS ASAP Discharge Summary  Date and Time: 07/21/2022 3:09 PM  Name: Kristina Owens  MRN:  [id number]   Discharge Diagnoses:  Final diagnoses:  Alcohol-induced mood disorder (HCC)  Alcohol dependence with uncomplicated withdrawal (HCC)    Subjective: Kristina Owens is a 56 year old female patient with a documented past psychiatric history of polysubstance abuse, cocaine use disorder, alcohol use disorder, methamphetamine abuse, GAD, MDD, and PTSD requesting alcohol detox.     Stay Summary: The patient was evaluated each day by a clinical provider to ascertain response to treatment. Improvement was noted by the patient's report of decreasing symptoms, improved sleep and appetite, affect, medication tolerance, behavior, and participation in unit programming.  Patient was asked each day to complete a self inventory noting mood, mental status, pain, new symptoms, anxiety and concerns.   Patient responded well to medication and being in a therapeutic and supportive environment. Positive and appropriate behavior was noted and the patient was motivated for recovery. The patient worked closely with the treatment team and case manager to develop a discharge plan with appropriate goals. Coping skills, problem solving as well as relaxation therapies were also part of the unit programming.   By the day of discharge patient was in much improved condition than upon admission in terms of withdrawal symptoms.  Symptoms were reported as significantly decreased or resolved completely. The patient denied SI/HI and voiced no VH but endorsed non-descript AH. She perseverated on having a diagnosis of schizophrenia throughout admission although advised otherwise. The patient was motivated to continue taking medication with a goal of continued improvement in mental health.    Total Time spent with patient: 20 minutes  Past Psychiatric History: polysubstance abuse, cocaine use disorder, alcohol use disorder,  methamphetamine abuse, GAD, MDD, and PTSD  Past Medical History:  Past Medical History:  Diagnosis Date   Anxiety    Depression    DVT (deep vein thrombosis) in pregnancy    GERD (gastroesophageal reflux disease)    Ovarian cyst     Past Surgical History:  Procedure Laterality Date   APPENDECTOMY  age 52   IR PTA VENOUS EXCEPT DIALYSIS CIRCUIT  01/16/2021   IR RADIOLOGIST EVAL & MGMT  03/19/2021   IR THROMBECT VENO MECH MOD SED  01/16/2021   IR US GUIDE VASC ACCESS LEFT  01/16/2021   IR VENO/EXT/UNI LEFT  01/16/2021   IR VENOCAVAGRAM IVC  01/16/2021   TONSILLECTOMY Bilateral age 24   Family History:  Family History  Problem Relation Age of Onset   Asthma Mother    COPD Mother    Cancer Father        thyroid cancer   Family Psychiatric History:  family psychiatric history of deceased father was an alcoholic, deceased mother had a history of depression, and sister has a history of depression.  Social History:  Social History   Substance and Sexual Activity  Alcohol Use Yes     Social History   Substance and Sexual Activity  Drug Use Yes   Types: "Crack" cocaine, Cocaine, Methamphetamines   Comment: last used 4/31/22    Social History   Socioeconomic History   Marital status: Divorced    Spouse name: Not on file   Number of children: Not on file   Years of education: Not on file   Highest education level: Not on file  Occupational History   Not on file  Tobacco Use   Smoking status: Every Day    Packs/day: 0.50    Years:  35.00    Total pack years: 17.50    Types: Cigarettes   Smokeless tobacco: Never  Vaping Use   Vaping Use: Never used  Substance and Sexual Activity   Alcohol use: Yes   Drug use: Yes    Types: "Crack" cocaine, Cocaine, Methamphetamines    Comment: last used 4/31/22   Sexual activity: Not on file  Other Topics Concern   Not on file  Social History Narrative   Not on file   Social Determinants of Health   Financial Resource Strain: Not  on file  Food Insecurity: Not on file  Transportation Needs: Not on file  Physical Activity: Not on file  Stress: Not on file  Social Connections: Not on file   SDOH:  SDOH Screenings   Alcohol Screen: High Risk (03/10/2022)  Depression (PHQ2-9): High Risk (07/19/2022)  Tobacco Use: High Risk (07/14/2022)    Tobacco Cessation:  A prescription for an FDA-approved tobacco cessation medication provided at discharge  Current Medications:  Current Facility-Administered Medications  Medication Dose Route Frequency Provider Last Rate Last Admin   acetaminophen (TYLENOL) tablet 650 mg  650 mg Oral Q6H PRN White, Patrice L, NP   650 mg at 07/20/22 2130   alum & mag hydroxide-simeth (MAALOX/MYLANTA) 200-200-20 MG/5ML suspension 30 mL  30 mL Oral Q4H PRN White, Patrice L, NP       ARIPiprazole (ABILIFY) tablet 5 mg  5 mg Oral Daily Eliseo Gum B, MD   5 mg at 07/21/22 0955   doxycycline (VIBRA-TABS) tablet 100 mg  100 mg Oral Q12H Karie Fetch, MD   100 mg at 07/21/22 0955   escitalopram (LEXAPRO) tablet 10 mg  10 mg Oral Daily Eliseo Gum B, MD   10 mg at 07/21/22 0955   hydrOXYzine (ATARAX) tablet 25 mg  25 mg Oral TID PRN Liborio Nixon L, NP   25 mg at 07/20/22 2129   magnesium hydroxide (MILK OF MAGNESIA) suspension 30 mL  30 mL Oral Daily PRN White, Patrice L, NP       multivitamin with minerals tablet 1 tablet  1 tablet Oral Daily White, Patrice L, NP   1 tablet at 07/21/22 0955   naltrexone (DEPADE) tablet 25 mg  25 mg Oral Daily Eliseo Gum B, MD   25 mg at 07/21/22 0955   nicotine (NICODERM CQ - dosed in mg/24 hours) patch 21 mg  21 mg Transdermal Daily Eliseo Gum B, MD   21 mg at 07/21/22 1478   thiamine (VITAMIN B1) injection 100 mg  100 mg Intramuscular Once White, Patrice L, NP       thiamine (VITAMIN B1) tablet 100 mg  100 mg Oral Daily White, Patrice L, NP   100 mg at 07/21/22 2956   Current Outpatient Medications  Medication Sig Dispense Refill   ARIPiprazole  (ABILIFY) 15 MG tablet Take 1 tablet (15 mg total) by mouth daily. 30 tablet 0   escitalopram (LEXAPRO) 5 MG tablet Take 3 tablets (15 mg total) by mouth daily. 90 tablet 0   gabapentin (NEURONTIN) 100 MG capsule Take 2 capsules (200 mg total) by mouth 3 (three) times daily. 180 capsule 0   ibuprofen (ADVIL) 800 MG tablet Take 1 tablet (800 mg total) by mouth 3 (three) times daily. 21 tablet 0   Multiple Vitamin (MULTIVITAMIN WITH MINERALS) TABS tablet Take 1 tablet by mouth daily.     ondansetron (ZOFRAN) 4 MG tablet Take 1 tablet (4 mg total) by mouth every 6 (six)  hours. 12 tablet 0   rivaroxaban (XARELTO) 20 MG TABS tablet Take 1 tablet (20 mg total) by mouth daily with supper. 30 tablet 0   traZODone (DESYREL) 50 MG tablet Take 1 tablet (50 mg total) by mouth at bedtime as needed for sleep. 30 tablet 0   cephALEXin (KEFLEX) 500 MG capsule Take 1 capsule (500 mg total) by mouth 4 (four) times daily. (Patient not taking: Reported on 07/11/2022) 20 capsule 0    PTA Medications: (Not in a hospital admission)      07/19/2022   12:42 PM 07/17/2022    6:05 PM 07/17/2022    3:00 PM  Depression screen PHQ 2/9  Decreased Interest 3 3 3   Down, Depressed, Hopeless 3 3 3   PHQ - 2 Score 6 6 6   Altered sleeping 2 3 2   Tired, decreased energy 3 3 3   Change in appetite 2 3 0  Feeling bad or failure about yourself  3 3 3   Trouble concentrating 3 3 3   Moving slowly or fidgety/restless 2 3 0  Suicidal thoughts 1 2 2   PHQ-9 Score 22 26 19   Difficult doing work/chores Extremely dIfficult Extremely dIfficult Extremely dIfficult    Flowsheet Row ED from 07/17/2022 in [facility] Most recent reading at 07/17/2022  6:11 PM ED from 07/15/2022 in [facility] Most recent reading at 07/15/2022  8:29 AM Admission (Pending) from OP [facility] Most recent reading at 07/15/2022 12:36 AM   C-SSRS RISK CATEGORY No Risk No Risk No Risk       Musculoskeletal  Strength & Muscle Tone: within normal limits Gait & Station: normal Patient leans: N/A  Psychiatric Specialty Exam  Presentation  General Appearance:  Appropriate for Environment  Eye Contact: Fair  Speech: Clear and Coherent  Speech Volume: Normal  Handedness: Right   Mood and Affect  Mood: Depressed  Affect: Congruent   Thought Process  Thought Processes: Linear  Descriptions of Associations:Circumstantial  Orientation:Full (Time, Place and Person)  Thought Content:Logical  Diagnosis of Schizophrenia or Schizoaffective disorder in past: No  Duration of Psychotic Symptoms: Greater than six months   Hallucinations:Hallucinations: None  Ideas of Reference:None  Suicidal Thoughts:Suicidal Thoughts: No  Homicidal Thoughts:Homicidal Thoughts: No   Sensorium  Memory: Immediate Fair; Recent Poor  Judgment: Poor  Insight: None   Executive Functions  Concentration: Poor  Attention Span: Fair  Recall: [employer] of Knowledge: Fair  Language: Fair   Psychomotor Activity  Psychomotor Activity: Psychomotor Activity: Normal   Assets  Assets: Resilience   Sleep  Sleep: Sleep: Poor   No data recorded  Physical Exam  Physical Exam HENT:     Head: Normocephalic and atraumatic.  Pulmonary:     Effort: Pulmonary effort is normal.  Neurological:     Mental Status: She is alert and oriented to person, place, and time.      Review of Systems  Psychiatric/Behavioral:  Positive for depression. Negative for hallucinations and suicidal ideas.   Blood pressure 112/67, pulse 96, temperature 97.9 F (36.6 C), temperature source Tympanic, resp. rate 18, height 5\' 3"  (1.6 m), weight 110 lb (49.9 kg), SpO2 96 %. Body mass index is 19.49 kg/m.  Demographic Factors:  Caucasian, Low socioeconomic status, and Unemployed  Loss Factors: Financial problems/change in  socioeconomic status  Historical Factors: Prior suicide attempts and Impulsivity  Risk Reduction Factors:   NA  Continued Clinical Symptoms:  Depression:  Comorbid alcohol abuse/dependence Alcohol/Substance Abuse/Dependencies Unstable or Poor Therapeutic Relationship  Cognitive Features That Contribute To Risk:  None    Suicide Risk:  Mild:  There are no identifiable plans, no associated intent, mild dysphoria and related symptoms, good self-control (both objective and subjective assessment), few other risk factors, and identifiable protective factors, including available and accessible social support.  Plan Of Care/Follow-up recommendations:  Follow-up recommendations:  Activity:  Normal, as tolerated Diet:  Per PCP recommendation  Patient is instructed prior to discharge to: Take all medications as prescribed by her mental healthcare provider. Report any adverse effects and/or reactions from the medicines to her outpatient provider promptly. Patient has been instructed & cautioned: To not engage in alcohol and or illegal drug use while on prescription medicines.  In the event of worsening symptoms, patient is instructed to call the crisis hotline at 988, 911 and or go to the nearest ED for appropriate evaluation and treatment of symptoms. To follow-up with her primary care provider for your other medical issues, concerns and or health care needs.   Disposition: [facility]  Lamar Sprinkles, MD 07/21/2022, 3:09 PM

## 2022-07-21 NOTE — ED Notes (Signed)
Patient resting quietly in bed with eyes closed no S/S of distress, respirations even and unlabored will continue to monitor for safety.

## 2022-07-21 NOTE — ED Provider Notes (Cosign Needed Addendum)
Behavioral Health Progress Note  Date and Time: 07/21/2022 8:01 AM Name: Kristina Owens MRN:  [id number]  Subjective:  Kristina Owens is a 56 year old female patient with a documented past psychiatric history of polysubstance abuse, cocaine use disorder, alcohol use disorder, methamphetamine abuse, GAD, MDD, and PTSD requesting alcohol detox.    Patient reports that she is "tired" today due to depression and requests an additional day of admission here. Patient is advised that she will not discharge until tomorrow to [facility], and she is satisfied with the answer. Patient  Patient again reports being dysphoric due to lack of support system in the area, and she is receptive to discussion about support in the professional arena.  Patient again reports that she is having "low" level cravings for Etoh, and denies adverse effects of naltrexone.  She does report cigarette cravings today, but states that the patch helps.   Patient denies SI, HI and VH today, but reports nondescript auditory hallucinations of a female voice that says encouraging things to her, although she cannot recall nor specify at the time the quality nor content of the voices.  She says that the voice is not distressing to her.   Objectively, a lot of today's conversation was the same as what was reported yesterday.   Diagnosis:  Final diagnoses:  Alcohol-induced mood disorder (HCC)  Alcohol dependence with uncomplicated withdrawal (HCC)    Total Time spent with patient: 20 minutes  Past Psychiatric History: polysubstance abuse, cocaine use disorder, alcohol use disorder, methamphetamine abuse, GAD, MDD, and PTSD  Past Medical History:  Past Medical History:  Diagnosis Date   Anxiety    Depression    DVT (deep vein thrombosis) in pregnancy    GERD (gastroesophageal reflux disease)    Ovarian cyst     Past Surgical History:  Procedure Laterality Date   APPENDECTOMY  age 52   IR PTA VENOUS EXCEPT DIALYSIS CIRCUIT   01/16/2021   IR RADIOLOGIST EVAL & MGMT  03/19/2021   IR THROMBECT VENO MECH MOD SED  01/16/2021   IR US GUIDE VASC ACCESS LEFT  01/16/2021   IR VENO/EXT/UNI LEFT  01/16/2021   IR VENOCAVAGRAM IVC  01/16/2021   TONSILLECTOMY Bilateral age 75   Family History:  Family History  Problem Relation Age of Onset   Asthma Mother    COPD Mother    Cancer Father        thyroid cancer   Family Psychiatric  History:  family psychiatric history of deceased father was an alcoholic, deceased mother had a history of depression, and sister has a history of depression.    Social History:  Social History   Substance and Sexual Activity  Alcohol Use Yes     Social History   Substance and Sexual Activity  Drug Use Yes   Types: "Crack" cocaine, Cocaine, Methamphetamines   Comment: last used 4/31/22    Social History   Socioeconomic History   Marital status: Divorced    Spouse name: Not on file   Number of children: Not on file   Years of education: Not on file   Highest education level: Not on file  Occupational History   Not on file  Tobacco Use   Smoking status: Every Day    Packs/day: 0.50    Years: 35.00    Total pack years: 17.50    Types: Cigarettes   Smokeless tobacco: Never  Vaping Use   Vaping Use: Never used  Substance and Sexual Activity   Alcohol  use: Yes   Drug use: Yes    Types: "Crack" cocaine, Cocaine, Methamphetamines    Comment: last used 4/31/22   Sexual activity: Not on file  Other Topics Concern   Not on file  Social History Narrative   Not on file   Social Determinants of Health   Financial Resource Strain: Not on file  Food Insecurity: Not on file  Transportation Needs: Not on file  Physical Activity: Not on file  Stress: Not on file  Social Connections: Not on file   SDOH:  SDOH Screenings   Alcohol Screen: High Risk (03/10/2022)  Depression (PHQ2-9): High Risk (07/19/2022)  Tobacco Use: High Risk (07/14/2022)   Additional Social History:     Pain Medications: See MAR Prescriptions: See MAR Over the Counter: See MAR History of alcohol / drug use?: Yes Longest period of sobriety (when/how long): 2 months Negative Consequences of Use: Legal Withdrawal Symptoms: Nausea / Vomiting, Tremors, Fever / Chills, Sweats, Other (Comment) (Headache) Name of Substance 1: Alcohol 1 - Age of First Use: 16 1 - Amount (size/oz): four 4LOCOS 1 - Duration: ongoing 1 - Last Use / Amount: 1 Bahama Mama today 1 - Method of Aquiring: Stealing 1- Route of Use: Oral Name of Substance 2: Methamohetamine 2 - Age of First Use: 47 2 - Amount (size/oz): $20 worth 2 - Frequency: Weekly 2 - Duration: Ongoing 2 - Last Use / Amount: 5 days ago 2 - Method of Aquiring: Purchases off the street 2 - Route of Substance Use: oral                Sleep: Poor  Appetite:  Good  Current Medications:  Current Facility-Administered Medications  Medication Dose Route Frequency Provider Last Rate Last Admin   acetaminophen (TYLENOL) tablet 650 mg  650 mg Oral Q6H PRN White, Patrice L, NP   650 mg at 07/20/22 2130   alum & mag hydroxide-simeth (MAALOX/MYLANTA) 200-200-20 MG/5ML suspension 30 mL  30 mL Oral Q4H PRN White, Patrice L, NP       ARIPiprazole (ABILIFY) tablet 5 mg  5 mg Oral Daily McQuilla, Gerlean Ren B, MD   5 mg at 07/20/22 0919   doxycycline (VIBRA-TABS) tablet 100 mg  100 mg Oral Q12H Karie Fetch, MD   100 mg at 07/20/22 2130   escitalopram (LEXAPRO) tablet 10 mg  10 mg Oral Daily Eliseo Gum B, MD   10 mg at 07/20/22 1610   hydrOXYzine (ATARAX) tablet 25 mg  25 mg Oral TID PRN Liborio Nixon L, NP   25 mg at 07/20/22 2129   LORazepam (ATIVAN) tablet 1 mg  1 mg Oral Daily White, Patrice L, NP       magnesium hydroxide (MILK OF MAGNESIA) suspension 30 mL  30 mL Oral Daily PRN White, Patrice L, NP       multivitamin with minerals tablet 1 tablet  1 tablet Oral Daily White, Patrice L, NP   1 tablet at 07/20/22 0917   naltrexone (DEPADE) tablet  25 mg  25 mg Oral Daily Eliseo Gum B, MD   25 mg at 07/20/22 1218   nicotine (NICODERM CQ - dosed in mg/24 hours) patch 21 mg  21 mg Transdermal Daily Eliseo Gum B, MD   21 mg at 07/20/22 1353   thiamine (VITAMIN B1) injection 100 mg  100 mg Intramuscular Once White, Patrice L, NP       thiamine (VITAMIN B1) tablet 100 mg  100 mg Oral Daily [place], Patrice  L, NP   100 mg at 07/20/22 4332   Current Outpatient Medications  Medication Sig Dispense Refill   ARIPiprazole (ABILIFY) 15 MG tablet Take 1 tablet (15 mg total) by mouth daily. 30 tablet 0   escitalopram (LEXAPRO) 5 MG tablet Take 3 tablets (15 mg total) by mouth daily. 90 tablet 0   gabapentin (NEURONTIN) 100 MG capsule Take 2 capsules (200 mg total) by mouth 3 (three) times daily. 180 capsule 0   ibuprofen (ADVIL) 800 MG tablet Take 1 tablet (800 mg total) by mouth 3 (three) times daily. 21 tablet 0   Multiple Vitamin (MULTIVITAMIN WITH MINERALS) TABS tablet Take 1 tablet by mouth daily.     ondansetron (ZOFRAN) 4 MG tablet Take 1 tablet (4 mg total) by mouth every 6 (six) hours. 12 tablet 0   rivaroxaban (XARELTO) 20 MG TABS tablet Take 1 tablet (20 mg total) by mouth daily with supper. 30 tablet 0   traZODone (DESYREL) 50 MG tablet Take 1 tablet (50 mg total) by mouth at bedtime as needed for sleep. 30 tablet 0   cephALEXin (KEFLEX) 500 MG capsule Take 1 capsule (500 mg total) by mouth 4 (four) times daily. (Patient not taking: Reported on 07/11/2022) 20 capsule 0    Labs  Lab Results:  Admission on 07/17/2022  Component Date Value Ref Range Status   SARS Coronavirus 2 by RT PCR 07/17/2022 NEGATIVE  NEGATIVE Final   Comment: (NOTE) SARS-CoV-2 target nucleic acids are NOT DETECTED.  The SARS-CoV-2 RNA is generally detectable in upper respiratory specimens during the acute phase of infection. The lowest concentration of SARS-CoV-2 viral copies this assay can detect is 138 copies/mL. A negative result does not preclude  SARS-Cov-2 infection and should not be used as the sole basis for treatment or other patient management decisions. A negative result may occur with  improper specimen collection/handling, submission of specimen other than nasopharyngeal swab, presence of viral mutation(s) within the areas targeted by this assay, and inadequate number of viral copies(<138 copies/mL). A negative result must be combined with clinical observations, patient history, and epidemiological information. The expected result is Negative.  Fact Sheet for Patients:  BloggerCourse.com  Fact Sheet for Healthcare Providers:  SeriousBroker.it  This test is no                          t yet approved or cleared by the [place] FDA and  has been authorized for detection and/or diagnosis of SARS-CoV-2 by FDA under an Emergency Use Authorization (EUA). This EUA will remain  in effect (meaning this test can be used) for the duration of the COVID-19 declaration under Section 564(b)(1) of the Act, 21 U.S.C.section 360bbb-3(b)(1), unless the authorization is terminated  or revoked sooner.       Influenza A by PCR 07/17/2022 NEGATIVE  NEGATIVE Final   Influenza B by PCR 07/17/2022 NEGATIVE  NEGATIVE Final   Comment: (NOTE) The Xpert Xpress SARS-CoV-2/FLU/RSV plus assay is intended as an aid in the diagnosis of influenza from Nasopharyngeal swab specimens and should not be used as a sole basis for treatment. Nasal washings and aspirates are unacceptable for Xpert Xpress SARS-CoV-2/FLU/RSV testing.  Fact Sheet for Patients: BloggerCourse.com  Fact Sheet for Healthcare Providers: SeriousBroker.it  This test is not yet approved or cleared by the [place] FDA and has been authorized for detection and/or diagnosis of SARS-CoV-2 by FDA under an Emergency Use Authorization (EUA). This EUA will remain in  effect  (meaning this test can be used) for the duration of the COVID-19 declaration under Section 564(b)(1) of the Act, 21 U.S.C. section 360bbb-3(b)(1), unless the authorization is terminated or revoked.     Resp Syncytial Virus by PCR 07/17/2022 NEGATIVE  NEGATIVE Final   Comment: (NOTE) Fact Sheet for Patients: BloggerCourse.com  Fact Sheet for Healthcare Providers: SeriousBroker.it  This test is not yet approved or cleared by the [place] FDA and has been authorized for detection and/or diagnosis of SARS-CoV-2 by FDA under an Emergency Use Authorization (EUA). This EUA will remain in effect (meaning this test can be used) for the duration of the COVID-19 declaration under Section 564(b)(1) of the Act, 21 U.S.C. section 360bbb-3(b)(1), unless the authorization is terminated or revoked.  Performed at [facility], [address] [place], [place] [address]    WBC 07/17/2022 8.9  4.0 - 10.5 K/uL Final   RBC 07/17/2022 4.64  3.87 - 5.11 MIL/uL Final   Hemoglobin 07/17/2022 14.2  12.0 - 15.0 g/dL Final   HCT 46/96/2952 41.6  36.0 - 46.0 % Final   MCV 07/17/2022 89.7  80.0 - 100.0 fL Final   MCH 07/17/2022 30.6  26.0 - 34.0 pg Final   MCHC 07/17/2022 34.1  30.0 - 36.0 g/dL Final   RDW 84/13/2440 13.7  11.5 - 15.5 % Final   Platelets 07/17/2022 306  150 - 400 K/uL Final   nRBC 07/17/2022 0.0  0.0 - 0.2 % Final   Neutrophils Relative % 07/17/2022 59  % Final   Neutro Abs 07/17/2022 5.2  1.7 - 7.7 K/uL Final   Lymphocytes Relative 07/17/2022 35  % Final   Lymphs Abs 07/17/2022 3.1  0.7 - 4.0 K/uL Final   Monocytes Relative 07/17/2022 5  % Final   Monocytes Absolute 07/17/2022 0.5  0.1 - 1.0 K/uL Final   Eosinophils Relative 07/17/2022 1  % Final   Eosinophils Absolute 07/17/2022 0.1  0.0 - 0.5 K/uL Final   Basophils Relative 07/17/2022 0  % Final   Basophils Absolute 07/17/2022 0.0  0.0 - 0.1 K/uL Final   Immature  Granulocytes 07/17/2022 0  % Final   Abs Immature Granulocytes 07/17/2022 0.04  0.00 - 0.07 K/uL Final   Performed at [facility], [address], [place], [place] [address]   Sodium 07/17/2022 139  135 - 145 mmol/L Final   Potassium 07/17/2022 3.7  3.5 - 5.1 mmol/L Final   Chloride 07/17/2022 104  98 - 111 mmol/L Final   CO2 07/17/2022 24  22 - 32 mmol/L Final   Glucose, Bld 07/17/2022 51 (L)  70 - 99 mg/dL Final   Glucose reference range applies only to samples taken after fasting for at least 8 hours.   BUN 07/17/2022 8  6 - 20 mg/dL Final   Creatinine, Ser 07/17/2022 0.69  0.44 - 1.00 mg/dL Final   Calcium 53/66/4403 8.8 (L)  8.9 - 10.3 mg/dL Final   Total Protein 47/42/5956 6.2 (L)  6.5 - 8.1 g/dL Final   Albumin 38/75/6433 3.6  3.5 - 5.0 g/dL Final   AST 29/51/8841 23  15 - 41 U/L Final   ALT 07/17/2022 19  0 - 44 U/L Final   Alkaline Phosphatase 07/17/2022 82  38 - 126 U/L Final   Total Bilirubin 07/17/2022 0.6  0.3 - 1.2 mg/dL Final   GFR, Estimated 07/17/2022 >60  >60 mL/min Final   Comment: (NOTE) Calculated using the CKD-EPI Creatinine Equation (2021)  Anion gap 07/17/2022 11  5 - 15 Final   Performed at [facility], [address], [place], [place] [address]   Alcohol, Ethyl (B) 07/17/2022 37 (H)  <10 mg/dL Final   Comment: (NOTE) Lowest detectable limit for serum alcohol is 10 mg/dL.  For medical purposes only. Performed at [facility], [address] [place], [place] [address]    Neisseria Gonorrhea 07/17/2022 Negative   Final   Chlamydia 07/17/2022 Positive (A)   Final   Comment 07/17/2022 Normal Reference Ranger Chlamydia - Negative   Final   Comment 07/17/2022 Normal Reference Range Neisseria Gonorrhea - Negative   Final   POC Amphetamine UR 07/17/2022 None Detected  NONE DETECTED (Cut Off Level 1000 ng/mL) Final   POC Secobarbital (BAR) 07/17/2022 None Detected  NONE DETECTED (Cut Off Level 300 ng/mL) Final   POC Buprenorphine (BUP)  07/17/2022 None Detected  NONE DETECTED (Cut Off Level 10 ng/mL) Final   POC Oxazepam (BZO) 07/17/2022 Positive (A)  NONE DETECTED (Cut Off Level 300 ng/mL) Final   POC Cocaine UR 07/17/2022 None Detected  NONE DETECTED (Cut Off Level 300 ng/mL) Final   POC Methamphetamine UR 07/17/2022 None Detected  NONE DETECTED (Cut Off Level 1000 ng/mL) Final   POC Morphine 07/17/2022 None Detected  NONE DETECTED (Cut Off Level 300 ng/mL) Final   POC Methadone UR 07/17/2022 None Detected  NONE DETECTED (Cut Off Level 300 ng/mL) Final   POC Oxycodone UR 07/17/2022 None Detected  NONE DETECTED (Cut Off Level 100 ng/mL) Final   POC Marijuana UR 07/17/2022 None Detected  NONE DETECTED (Cut Off Level 50 ng/mL) Final   Preg Test, Ur 07/17/2022 Negative  Negative Final   RPR Ser Ql 07/17/2022 NON REACTIVE  NON REACTIVE Final   Performed at [facility], [address] [place], [place] [address]   HIV Screen 4th Generation wRfx 07/17/2022 Non Reactive  Non Reactive Final   Performed at [facility], [address] [place], [place] [address]   SARSCOV2ONAVIRUS 2 AG 07/17/2022 NEGATIVE  NEGATIVE Final   Comment: (NOTE) SARS-CoV-2 antigen NOT DETECTED.   Negative results are presumptive.  Negative results do not preclude SARS-CoV-2 infection and should not be used as the sole basis for treatment or other patient management decisions, including infection  control decisions, particularly in the presence of clinical signs and  symptoms consistent with COVID-19, or in those who have been in contact with the virus.  Negative results must be combined with clinical observations, patient history, and epidemiological information. The expected result is Negative.  Fact Sheet for Patients: https://www.jennings-kim.com/  Fact Sheet for Healthcare Providers: https://alexander-rogers.biz/  This test is not yet approved or cleared by the [place] FDA and  has been authorized for  detection and/or diagnosis of SARS-CoV-2 by FDA under an Emergency Use Authorization (EUA).  This EUA will remain in effect (meaning this test can be used) for the duration of  the COV                          ID-19 declaration under Section 564(b)(1) of the Act, 21 U.S.C. section 360bbb-3(b)(1), unless the authorization is terminated or revoked sooner.     Preg Test, Ur 07/17/2022 NEGATIVE  NEGATIVE Final   Comment:        THE SENSITIVITY OF THIS METHODOLOGY IS >24 mIU/mL   Admission on 07/14/2022, Discharged on 07/14/2022  Component Date Value Ref  Range Status   SARS Coronavirus 2 by RT PCR 07/14/2022 NEGATIVE  NEGATIVE Final   Comment: (NOTE) SARS-CoV-2 target nucleic acids are NOT DETECTED.  The SARS-CoV-2 RNA is generally detectable in upper respiratory specimens during the acute phase of infection. The lowest concentration of SARS-CoV-2 viral copies this assay can detect is 138 copies/mL. A negative result does not preclude SARS-Cov-2 infection and should not be used as the sole basis for treatment or other patient management decisions. A negative result may occur with  improper specimen collection/handling, submission of specimen other than nasopharyngeal swab, presence of viral mutation(s) within the areas targeted by this assay, and inadequate number of viral copies(<138 copies/mL). A negative result must be combined with clinical observations, patient history, and epidemiological information. The expected result is Negative.  Fact Sheet for Patients:  BloggerCourse.com  Fact Sheet for Healthcare Providers:  SeriousBroker.it  This test is no                          t yet approved or cleared by the [place] FDA and  has been authorized for detection and/or diagnosis of SARS-CoV-2 by FDA under an Emergency Use Authorization (EUA). This EUA will remain  in effect (meaning this test can be used) for the duration of  the COVID-19 declaration under Section 564(b)(1) of the Act, 21 U.S.C.section 360bbb-3(b)(1), unless the authorization is terminated  or revoked sooner.       Influenza A by PCR 07/14/2022 NEGATIVE  NEGATIVE Final   Influenza B by PCR 07/14/2022 NEGATIVE  NEGATIVE Final   Comment: (NOTE) The Xpert Xpress SARS-CoV-2/FLU/RSV plus assay is intended as an aid in the diagnosis of influenza from Nasopharyngeal swab specimens and should not be used as a sole basis for treatment. Nasal washings and aspirates are unacceptable for Xpert Xpress SARS-CoV-2/FLU/RSV testing.  Fact Sheet for Patients: BloggerCourse.com  Fact Sheet for Healthcare Providers: SeriousBroker.it  This test is not yet approved or cleared by the [place] FDA and has been authorized for detection and/or diagnosis of SARS-CoV-2 by FDA under an Emergency Use Authorization (EUA). This EUA will remain in effect (meaning this test can be used) for the duration of the COVID-19 declaration under Section 564(b)(1) of the Act, 21 U.S.C. section 360bbb-3(b)(1), unless the authorization is terminated or revoked.  Performed at [facility], [address] [place], [place] [address]   Admission on 07/11/2022, Discharged on 07/11/2022  Component Date Value Ref Range Status   WBC 07/11/2022 9.7  4.0 - 10.5 K/uL Final   RBC 07/11/2022 5.26 (H)  3.87 - 5.11 MIL/uL Final   Hemoglobin 07/11/2022 15.9 (H)  12.0 - 15.0 g/dL Final   HCT 60/45/4098 48.3 (H)  36.0 - 46.0 % Final   MCV 07/11/2022 91.8  80.0 - 100.0 fL Final   MCH 07/11/2022 30.2  26.0 - 34.0 pg Final   MCHC 07/11/2022 32.9  30.0 - 36.0 g/dL Final   RDW 11/91/4782 13.5  11.5 - 15.5 % Final   Platelets 07/11/2022 282  150 - 400 K/uL Final   nRBC 07/11/2022 0.0  0.0 - 0.2 % Final   Neutrophils Relative % 07/11/2022 63  % Final   Neutro Abs 07/11/2022 6.1  1.7 - 7.7 K/uL Final   Lymphocytes Relative  07/11/2022 29  % Final   Lymphs Abs 07/11/2022 2.8  0.7 - 4.0 K/uL Final   Monocytes Relative 07/11/2022 7  % Final   Monocytes Absolute  07/11/2022 0.7  0.1 - 1.0 K/uL Final   Eosinophils Relative 07/11/2022 1  % Final   Eosinophils Absolute 07/11/2022 0.1  0.0 - 0.5 K/uL Final   Basophils Relative 07/11/2022 0  % Final   Basophils Absolute 07/11/2022 0.0  0.0 - 0.1 K/uL Final   Immature Granulocytes 07/11/2022 0  % Final   Abs Immature Granulocytes 07/11/2022 0.03  0.00 - 0.07 K/uL Final   Performed at [facility], [address] [place], [place] [address]   Sodium 07/11/2022 139  135 - 145 mmol/L Final   Potassium 07/11/2022 3.8  3.5 - 5.1 mmol/L Final   Chloride 07/11/2022 105  98 - 111 mmol/L Final   CO2 07/11/2022 24  22 - 32 mmol/L Final   Glucose, Bld 07/11/2022 90  70 - 99 mg/dL Final   Glucose reference range applies only to samples taken after fasting for at least 8 hours.   BUN 07/11/2022 9  6 - 20 mg/dL Final   Creatinine, Ser 07/11/2022 0.57  0.44 - 1.00 mg/dL Final   Calcium 60/45/409812/03/2022 8.5 (L)  8.9 - 10.3 mg/dL Final   Total Protein 11/91/478212/03/2022 6.3 (L)  6.5 - 8.1 g/dL Final   Albumin 95/62/130812/03/2022 3.6  3.5 - 5.0 g/dL Final   AST 65/78/469612/03/2022 19  15 - 41 U/L Final   ALT 07/11/2022 16  0 - 44 U/L Final   Alkaline Phosphatase 07/11/2022 78  38 - 126 U/L Final   Total Bilirubin 07/11/2022 0.4  0.3 - 1.2 mg/dL Final   GFR, Estimated 07/11/2022 >60  >60 mL/min Final   Comment: (NOTE) Calculated using the CKD-EPI Creatinine Equation (2021)    Anion gap 07/11/2022 10  5 - 15 Final   Performed at [facility], [address] [place], [place] [address]   Troponin I (High Sensitivity) 07/11/2022 <2  <18 ng/L Final   Comment: (NOTE) Elevated high sensitivity troponin I (hsTnI) values and significant  changes across serial measurements may suggest ACS but many other  chronic and acute conditions are known to elevate hsTnI results.  Refer to the "Links" section for chest  pain algorithms and additional  guidance. Performed at [facility], [address] [place], [place] [address]    Lipase 07/11/2022 35  11 - 51 U/L Final   Performed at [facility], [address] [place], [place] [address]   D-Dimer, Quant 07/11/2022 <0.27  0.00 - 0.50 ug/mL-FEU Final   Comment: (NOTE) At the manufacturer cut-off value of 0.5 g/mL FEU, this assay has a negative predictive value of 95-100%.This assay is intended for use in conjunction with a clinical pretest probability (PTP) assessment model to exclude pulmonary embolism (PE) and deep venous thrombosis (DVT) in outpatients suspected of PE or DVT. Results should be correlated with clinical presentation. Performed at [facility], [address] [place], [place] [address]    Color, Urine 07/11/2022 YELLOW  YELLOW Final   APPearance 07/11/2022 HAZY (A)  CLEAR Final   Specific Gravity, Urine 07/11/2022 1.024  1.005 - 1.030 Final   pH 07/11/2022 6.0  5.0 - 8.0 Final   Glucose, UA 07/11/2022 NEGATIVE  NEGATIVE mg/dL Final   Hgb urine dipstick 07/11/2022 NEGATIVE  NEGATIVE Final   Bilirubin Urine 07/11/2022 NEGATIVE  NEGATIVE Final   Ketones, ur 07/11/2022 NEGATIVE  NEGATIVE mg/dL Final   Protein, ur 64/40/347412/03/2022 NEGATIVE  NEGATIVE mg/dL Final   Nitrite 25/95/638712/03/2022 NEGATIVE  NEGATIVE Final  Leukocytes,Ua 07/11/2022 NEGATIVE  NEGATIVE Final   Performed at [facility], [address] [place], [place] [address]   SARS Coronavirus 2 by RT PCR 07/11/2022 NEGATIVE  NEGATIVE Final   Comment: (NOTE) SARS-CoV-2 target nucleic acids are NOT DETECTED.  The SARS-CoV-2 RNA is generally detectable in upper respiratory specimens during the acute phase of infection. The lowest concentration of SARS-CoV-2 viral copies this assay can detect is 138 copies/mL. A negative result does not preclude SARS-Cov-2 infection and should not be used as the sole basis for treatment or other patient management decisions.  A negative result may occur with  improper specimen collection/handling, submission of specimen other than nasopharyngeal swab, presence of viral mutation(s) within the areas targeted by this assay, and inadequate number of viral copies(<138 copies/mL). A negative result must be combined with clinical observations, patient history, and epidemiological information. The expected result is Negative.  Fact Sheet for Patients:  BloggerCourse.com  Fact Sheet for Healthcare Providers:  SeriousBroker.it  This test is no                          t yet approved or cleared by the [place] FDA and  has been authorized for detection and/or diagnosis of SARS-CoV-2 by FDA under an Emergency Use Authorization (EUA). This EUA will remain  in effect (meaning this test can be used) for the duration of the COVID-19 declaration under Section 564(b)(1) of the Act, 21 U.S.C.section 360bbb-3(b)(1), unless the authorization is terminated  or revoked sooner.       Influenza A by PCR 07/11/2022 NEGATIVE  NEGATIVE Final   Influenza B by PCR 07/11/2022 NEGATIVE  NEGATIVE Final   Comment: (NOTE) The Xpert Xpress SARS-CoV-2/FLU/RSV plus assay is intended as an aid in the diagnosis of influenza from Nasopharyngeal swab specimens and should not be used as a sole basis for treatment. Nasal washings and aspirates are unacceptable for Xpert Xpress SARS-CoV-2/FLU/RSV testing.  Fact Sheet for Patients: BloggerCourse.com  Fact Sheet for Healthcare Providers: SeriousBroker.it  This test is not yet approved or cleared by the [place] FDA and has been authorized for detection and/or diagnosis of SARS-CoV-2 by FDA under an Emergency Use Authorization (EUA). This EUA will remain in effect (meaning this test can be used) for the duration of the COVID-19 declaration under Section 564(b)(1) of the Act, 21  U.S.C. section 360bbb-3(b)(1), unless the authorization is terminated or revoked.  Performed at [facility], [address] [place], [place] [address]    I-stat hCG, quantitative 07/11/2022 <5.0  <5 mIU/mL Final   Comment 3 07/11/2022          Final   Comment:   GEST. AGE      CONC.  (mIU/mL)   <=1 WEEK        5 - 50     2 WEEKS       50 - 500     3 WEEKS       100 - 10,000     4 WEEKS     1,000 - 30,000        FEMALE AND NON-PREGNANT FEMALE:     LESS THAN 5 mIU/mL    Troponin I (High Sensitivity) 07/11/2022 2  <18 ng/L Final   Comment: (NOTE) Elevated high sensitivity troponin I (hsTnI) values and significant  changes across serial measurements may suggest ACS but many other  chronic and acute conditions are known to elevate hsTnI  results.  Refer to the "Links" section for chest pain algorithms and additional  guidance. Performed at [facility], [address], [place], [place] [address]   Admission on 07/02/2022, Discharged on 07/02/2022  Component Date Value Ref Range Status   Color, Urine 07/02/2022 YELLOW  YELLOW Final   APPearance 07/02/2022 HAZY (A)  CLEAR Final   Specific Gravity, Urine 07/02/2022 1.014  1.005 - 1.030 Final   pH 07/02/2022 5.0  5.0 - 8.0 Final   Glucose, UA 07/02/2022 NEGATIVE  NEGATIVE mg/dL Final   Hgb urine dipstick 07/02/2022 SMALL (A)  NEGATIVE Final   Bilirubin Urine 07/02/2022 NEGATIVE  NEGATIVE Final   Ketones, ur 07/02/2022 NEGATIVE  NEGATIVE mg/dL Final   Protein, ur 78/29/5621 NEGATIVE  NEGATIVE mg/dL Final   Nitrite 30/86/5784 POSITIVE (A)  NEGATIVE Final   Leukocytes,Ua 07/02/2022 LARGE (A)  NEGATIVE Final   RBC / HPF 07/02/2022 6-10  0 - 5 RBC/hpf Final   WBC, UA 07/02/2022 >50 (H)  0 - 5 WBC/hpf Final   Bacteria, UA 07/02/2022 FEW (A)  NONE SEEN Final   Squamous Epithelial / LPF 07/02/2022 6-10  0 - 5 Final   Mucus 07/02/2022 PRESENT   Final   Non Squamous Epithelial 07/02/2022 0-5 (A)  NONE SEEN Final   Performed at  [facility], [address], [place], [place] [address]   WBC 07/02/2022 9.9  4.0 - 10.5 K/uL Final   RBC 07/02/2022 5.18 (H)  3.87 - 5.11 MIL/uL Final   Hemoglobin 07/02/2022 15.8 (H)  12.0 - 15.0 g/dL Final   HCT 52/84/1324 48.5 (H)  36.0 - 46.0 % Final   MCV 07/02/2022 93.6  80.0 - 100.0 fL Final   MCH 07/02/2022 30.5  26.0 - 34.0 pg Final   MCHC 07/02/2022 32.6  30.0 - 36.0 g/dL Final   RDW 40/05/2724 13.5  11.5 - 15.5 % Final   Platelets 07/02/2022 249  150 - 400 K/uL Final   nRBC 07/02/2022 0.0  0.0 - 0.2 % Final   Performed at [facility], [address], [place], [place] [address]   Sodium 07/02/2022 138  135 - 145 mmol/L Final   Potassium 07/02/2022 3.9  3.5 - 5.1 mmol/L Final   Chloride 07/02/2022 105  98 - 111 mmol/L Final   CO2 07/02/2022 26  22 - 32 mmol/L Final   Glucose, Bld 07/02/2022 114 (H)  70 - 99 mg/dL Final   Glucose reference range applies only to samples taken after fasting for at least 8 hours.   BUN 07/02/2022 9  6 - 20 mg/dL Final   Creatinine, Ser 07/02/2022 0.64  0.44 - 1.00 mg/dL Final   Calcium 03/47/4259 8.9  8.9 - 10.3 mg/dL Final   GFR, Estimated 07/02/2022 >60  >60 mL/min Final   Comment: (NOTE) Calculated using the CKD-EPI Creatinine Equation (2021)    Anion gap 07/02/2022 7  5 - 15 Final   Performed at [facility], [address] [place], [place] [address]   Lipase 07/02/2022 35  11 - 51 U/L Final   Performed at [facility], [address], [place], [place] [address]   Total Protein 07/02/2022 6.9  6.5 - 8.1 g/dL Final   Albumin 29/51/8841 3.8  3.5 - 5.0 g/dL Final   AST 66/01/3015 17  15 - 41 U/L Final   ALT 07/02/2022 16  0 - 44 U/L Final   Alkaline Phosphatase 07/02/2022 91  38 -  126 U/L Final   Total Bilirubin 07/02/2022 0.9  0.3 - 1.2 mg/dL Final   Bilirubin, Direct 07/02/2022 0.1  0.0 - 0.2 mg/dL Final   Indirect Bilirubin 07/02/2022 0.8  0.3 - 0.9 mg/dL  Final   Performed at [facility], [address], [place], [place] [address]  Admission on 06/06/2022, Discharged on 06/06/2022  Component Date Value Ref Range Status   Sodium 06/06/2022 140  135 - 145 mmol/L Final   Potassium 06/06/2022 3.2 (L)  3.5 - 5.1 mmol/L Final   Chloride 06/06/2022 103  98 - 111 mmol/L Final   CO2 06/06/2022 26  22 - 32 mmol/L Final   Glucose, Bld 06/06/2022 97  70 - 99 mg/dL Final   Glucose reference range applies only to samples taken after fasting for at least 8 hours.   BUN 06/06/2022 6  6 - 20 mg/dL Final   Creatinine, Ser 06/06/2022 0.54  0.44 - 1.00 mg/dL Final   Calcium 86/76/7209 9.0  8.9 - 10.3 mg/dL Final   Total Protein 47/04/6282 6.5  6.5 - 8.1 g/dL Final   Albumin 66/29/4765 3.6  3.5 - 5.0 g/dL Final   AST 46/50/3546 20  15 - 41 U/L Final   ALT 06/06/2022 17  0 - 44 U/L Final   Alkaline Phosphatase 06/06/2022 111  38 - 126 U/L Final   Total Bilirubin 06/06/2022 0.3  0.3 - 1.2 mg/dL Final   GFR, Estimated 06/06/2022 >60  >60 mL/min Final   Comment: (NOTE) Calculated using the CKD-EPI Creatinine Equation (2021)    Anion gap 06/06/2022 11  5 - 15 Final   Performed at [facility], [address] [place], [place] [address]   Alcohol, Ethyl (B) 06/06/2022 18 (H)  <10 mg/dL Final   Comment: (NOTE) Lowest detectable limit for serum alcohol is 10 mg/dL.  For medical purposes only. Performed at [facility], [address] [place], [place] [address]    WBC 06/06/2022 6.0  4.0 - 10.5 K/uL Final   RBC 06/06/2022 4.76  3.87 - 5.11 MIL/uL Final   Hemoglobin 06/06/2022 14.5  12.0 - 15.0 g/dL Final   HCT 01/74/9449 43.9  36.0 - 46.0 % Final   MCV 06/06/2022 92.2  80.0 - 100.0 fL Final   MCH 06/06/2022 30.5  26.0 - 34.0 pg Final   MCHC 06/06/2022 33.0  30.0 - 36.0 g/dL Final   RDW 67/59/1638 13.5  11.5 - 15.5 % Final   Platelets 06/06/2022 224  150 - 400 K/uL Final   nRBC 06/06/2022 0.0  0.0 - 0.2 % Final   Performed  at [facility], [address], [place], [place] [address]   I-stat hCG, quantitative 06/06/2022 <5.0  <5 mIU/mL Final   Comment 3 06/06/2022          Final   Comment:   GEST. AGE      CONC.  (mIU/mL)   <=1 WEEK        5 - 50     2 WEEKS       50 - 500     3 WEEKS       100 - 10,000     4 WEEKS     1,000 - 30,000        FEMALE AND NON-PREGNANT FEMALE:     LESS THAN 5 mIU/mL   Admission on 06/05/2022, Discharged on 06/05/2022  Component Date Value Ref Range Status   Sodium 06/05/2022  139  135 - 145 mmol/L Final   Potassium 06/05/2022 3.7  3.5 - 5.1 mmol/L Final   Chloride 06/05/2022 105  98 - 111 mmol/L Final   CO2 06/05/2022 26  22 - 32 mmol/L Final   Glucose, Bld 06/05/2022 114 (H)  70 - 99 mg/dL Final   Glucose reference range applies only to samples taken after fasting for at least 8 hours.   BUN 06/05/2022 8  6 - 20 mg/dL Final   Creatinine, Ser 06/05/2022 0.50  0.44 - 1.00 mg/dL Final   Calcium 16/05/9603 8.8 (L)  8.9 - 10.3 mg/dL Final   Total Protein 54/04/8118 7.1  6.5 - 8.1 g/dL Final   Albumin 14/78/2956 3.9  3.5 - 5.0 g/dL Final   AST 21/30/8657 20  15 - 41 U/L Final   ALT 06/05/2022 17  0 - 44 U/L Final   Alkaline Phosphatase 06/05/2022 120  38 - 126 U/L Final   Total Bilirubin 06/05/2022 0.5  0.3 - 1.2 mg/dL Final   GFR, Estimated 06/05/2022 >60  >60 mL/min Final   Comment: (NOTE) Calculated using the CKD-EPI Creatinine Equation (2021)    Anion gap 06/05/2022 8  5 - 15 Final   Performed at [facility], [address] [place], [place] [address]   WBC 06/05/2022 4.9  4.0 - 10.5 K/uL Final   RBC 06/05/2022 5.00  3.87 - 5.11 MIL/uL Final   Hemoglobin 06/05/2022 15.1 (H)  12.0 - 15.0 g/dL Final   HCT 29/52/8413 46.0  36.0 - 46.0 % Final   MCV 06/05/2022 92.0  80.0 - 100.0 fL Final   MCH 06/05/2022 30.2  26.0 - 34.0 pg Final   MCHC 06/05/2022 32.8  30.0 - 36.0 g/dL Final   RDW 24/40/1027 13.6  11.5 - 15.5 % Final   Platelets 06/05/2022 248   150 - 400 K/uL Final   nRBC 06/05/2022 0.0  0.0 - 0.2 % Final   Performed at [facility], [address] [place], [place] [address]   Opiates 06/05/2022 NONE DETECTED  NONE DETECTED Final   Cocaine 06/05/2022 NONE DETECTED  NONE DETECTED Final   Benzodiazepines 06/05/2022 NONE DETECTED  NONE DETECTED Final   Amphetamines 06/05/2022 POSITIVE (A)  NONE DETECTED Final   Tetrahydrocannabinol 06/05/2022 NONE DETECTED  NONE DETECTED Final   Barbiturates 06/05/2022 NONE DETECTED  NONE DETECTED Final   Comment: (NOTE) DRUG SCREEN FOR MEDICAL PURPOSES ONLY.  IF CONFIRMATION IS NEEDED FOR ANY PURPOSE, NOTIFY LAB WITHIN 5 DAYS.  LOWEST DETECTABLE LIMITS FOR URINE DRUG SCREEN Drug Class                     Cutoff (ng/mL) Amphetamine and metabolites    1000 Barbiturate and metabolites    200 Benzodiazepine                 200 Opiates and metabolites        300 Cocaine and metabolites        300 THC                            50 Performed at [facility], [address] [place], [place] [address]    Alcohol, Ethyl (B) 06/05/2022 <10  <10 mg/dL Final   Comment: (NOTE) Lowest detectable limit for serum alcohol is 10 mg/dL.  For medical purposes only. Performed at [facility], [address]  [place], [place] [address]   Admission on 05/10/2022, Discharged on 05/10/2022  Component Date Value Ref Range Status   Opiates 05/10/2022 NONE DETECTED  NONE DETECTED Final   Cocaine 05/10/2022 POSITIVE (A)  NONE DETECTED Final   Benzodiazepines 05/10/2022 NONE DETECTED  NONE DETECTED Final   Amphetamines 05/10/2022 NONE DETECTED  NONE DETECTED Final   Tetrahydrocannabinol 05/10/2022 NONE DETECTED  NONE DETECTED Final   Barbiturates 05/10/2022 NONE DETECTED  NONE DETECTED Final   Comment: (NOTE) DRUG SCREEN FOR MEDICAL PURPOSES ONLY.  IF CONFIRMATION IS NEEDED FOR ANY PURPOSE, NOTIFY LAB WITHIN 5 DAYS.  LOWEST DETECTABLE LIMITS FOR  URINE DRUG SCREEN Drug Class                     Cutoff (ng/mL) Amphetamine and metabolites    1000 Barbiturate and metabolites    200 Benzodiazepine                 200 Tricyclics and metabolites     300 Opiates and metabolites        300 Cocaine and metabolites        300 THC                            50 Performed at [facility], [address], [place], [place] [address]    Alcohol, Ethyl (B) 05/10/2022 <10  <10 mg/dL Final   Comment: (NOTE) Lowest detectable limit for serum alcohol is 10 mg/dL.  For medical purposes only. Performed at [facility], [address] [place], [place] [address]    Sodium 05/10/2022 137  135 - 145 mmol/L Final   Potassium 05/10/2022 4.2  3.5 - 5.1 mmol/L Final   Chloride 05/10/2022 102  98 - 111 mmol/L Final   CO2 05/10/2022 29  22 - 32 mmol/L Final   Glucose, Bld 05/10/2022 90  70 - 99 mg/dL Final   Glucose reference range applies only to samples taken after fasting for at least 8 hours.   BUN 05/10/2022 11  6 - 20 mg/dL Final   Creatinine, Ser 05/10/2022 0.57  0.44 - 1.00 mg/dL Final   Calcium 36/64/4034 8.7 (L)  8.9 - 10.3 mg/dL Final   Total Protein 74/25/9563 7.2  6.5 - 8.1 g/dL Final   Albumin 87/56/4332 3.7  3.5 - 5.0 g/dL Final   AST 95/18/8416 18  15 - 41 U/L Final   ALT 05/10/2022 16  0 - 44 U/L Final   Alkaline Phosphatase 05/10/2022 97  38 - 126 U/L Final   Total Bilirubin 05/10/2022 0.5  0.3 - 1.2 mg/dL Final   GFR, Estimated 05/10/2022 >60  >60 mL/min Final   Comment: (NOTE) Calculated using the CKD-EPI Creatinine Equation (2021)    Anion gap 05/10/2022 6  5 - 15 Final   Performed at [facility], [address] [place], [place] [address]   WBC 05/10/2022 6.4  4.0 - 10.5 K/uL Final   RBC 05/10/2022 5.56 (H)  3.87 - 5.11 MIL/uL Final   Hemoglobin 05/10/2022 16.3 (H)  12.0 - 15.0 g/dL Final   HCT 16/08/930 50.7 (H)  36.0 - 46.0 % Final   MCV 05/10/2022 91.2  80.0 -  100.0 fL Final   MCH 05/10/2022 29.3  26.0 - 34.0 pg Final   MCHC 05/10/2022 32.1  30.0 - 36.0 g/dL Final   RDW 35/57/3220 13.2  11.5 - 15.5 % Final  Platelets 05/10/2022 250  150 - 400 K/uL Final   nRBC 05/10/2022 0.0  0.0 - 0.2 % Final   Neutrophils Relative % 05/10/2022 49  % Final   Neutro Abs 05/10/2022 3.2  1.7 - 7.7 K/uL Final   Lymphocytes Relative 05/10/2022 40  % Final   Lymphs Abs 05/10/2022 2.6  0.7 - 4.0 K/uL Final   Monocytes Relative 05/10/2022 8  % Final   Monocytes Absolute 05/10/2022 0.5  0.1 - 1.0 K/uL Final   Eosinophils Relative 05/10/2022 1  % Final   Eosinophils Absolute 05/10/2022 0.0  0.0 - 0.5 K/uL Final   Basophils Relative 05/10/2022 1  % Final   Basophils Absolute 05/10/2022 0.0  0.0 - 0.1 K/uL Final   Immature Granulocytes 05/10/2022 1  % Final   Abs Immature Granulocytes 05/10/2022 0.04  0.00 - 0.07 K/uL Final   Performed at [facility], [address], [place], [place] [address]   Lipase 05/10/2022 34  11 - 51 U/L Final   Performed at [facility], [address], [place], [place] [address]  Admission on 05/08/2022, Discharged on 05/09/2022  Component Date Value Ref Range Status   SARS Coronavirus 2 by RT PCR 05/08/2022 NEGATIVE  NEGATIVE Final   Comment: (NOTE) SARS-CoV-2 target nucleic acids are NOT DETECTED.  The SARS-CoV-2 RNA is generally detectable in upper respiratory specimens during the acute phase of infection. The lowest concentration of SARS-CoV-2 viral copies this assay can detect is 138 copies/mL. A negative result does not preclude SARS-Cov-2 infection and should not be used as the sole basis for treatment or other patient management decisions. A negative result may occur with  improper specimen collection/handling, submission of specimen other than nasopharyngeal swab, presence of viral mutation(s) within the areas targeted by this assay, and inadequate number of viral copies(<138 copies/mL). A  negative result must be combined with clinical observations, patient history, and epidemiological information. The expected result is Negative.  Fact Sheet for Patients:  BloggerCourse.com  Fact Sheet for Healthcare Providers:  SeriousBroker.it  This test is no                          t yet approved or cleared by the [place] FDA and  has been authorized for detection and/or diagnosis of SARS-CoV-2 by FDA under an Emergency Use Authorization (EUA). This EUA will remain  in effect (meaning this test can be used) for the duration of the COVID-19 declaration under Section 564(b)(1) of the Act, 21 U.S.C.section 360bbb-3(b)(1), unless the authorization is terminated  or revoked sooner.       Influenza A by PCR 05/08/2022 NEGATIVE  NEGATIVE Final   Influenza B by PCR 05/08/2022 NEGATIVE  NEGATIVE Final   Comment: (NOTE) The Xpert Xpress SARS-CoV-2/FLU/RSV plus assay is intended as an aid in the diagnosis of influenza from Nasopharyngeal swab specimens and should not be used as a sole basis for treatment. Nasal washings and aspirates are unacceptable for Xpert Xpress SARS-CoV-2/FLU/RSV testing.  Fact Sheet for Patients: BloggerCourse.com  Fact Sheet for Healthcare Providers: SeriousBroker.it  This test is not yet approved or cleared by the [place] FDA and has been authorized for detection and/or diagnosis of SARS-CoV-2 by FDA under an Emergency Use Authorization (EUA). This EUA will remain in effect (meaning this test can be used) for the duration of the COVID-19 declaration under Section 564(b)(1) of the Act, 21 U.S.C. section 360bbb-3(b)(1), unless the authorization is terminated  or revoked.  Performed at [facility], [address] [place], [place] [address]    WBC 05/08/2022 6.5  4.0 - 10.5 K/uL Final   RBC 05/08/2022 5.36 (H)  3.87 - 5.11 MIL/uL Final    Hemoglobin 05/08/2022 16.1 (H)  12.0 - 15.0 g/dL Final   HCT 60/45/4098 47.9 (H)  36.0 - 46.0 % Final   MCV 05/08/2022 89.4  80.0 - 100.0 fL Final   MCH 05/08/2022 30.0  26.0 - 34.0 pg Final   MCHC 05/08/2022 33.6  30.0 - 36.0 g/dL Final   RDW 11/91/4782 13.2  11.5 - 15.5 % Final   Platelets 05/08/2022 237  150 - 400 K/uL Final   nRBC 05/08/2022 0.0  0.0 - 0.2 % Final   Neutrophils Relative % 05/08/2022 37  % Final   Neutro Abs 05/08/2022 2.4  1.7 - 7.7 K/uL Final   Lymphocytes Relative 05/08/2022 52  % Final   Lymphs Abs 05/08/2022 3.4  0.7 - 4.0 K/uL Final   Monocytes Relative 05/08/2022 8  % Final   Monocytes Absolute 05/08/2022 0.5  0.1 - 1.0 K/uL Final   Eosinophils Relative 05/08/2022 1  % Final   Eosinophils Absolute 05/08/2022 0.1  0.0 - 0.5 K/uL Final   Basophils Relative 05/08/2022 1  % Final   Basophils Absolute 05/08/2022 0.0  0.0 - 0.1 K/uL Final   Immature Granulocytes 05/08/2022 1  % Final   Abs Immature Granulocytes 05/08/2022 0.05  0.00 - 0.07 K/uL Final   Performed at [facility], [address] [place], [place] [address]   Sodium 05/08/2022 135  135 - 145 mmol/L Final   Potassium 05/08/2022 3.9  3.5 - 5.1 mmol/L Final   Chloride 05/08/2022 100  98 - 111 mmol/L Final   CO2 05/08/2022 27  22 - 32 mmol/L Final   Glucose, Bld 05/08/2022 93  70 - 99 mg/dL Final   Glucose reference range applies only to samples taken after fasting for at least 8 hours.   BUN 05/08/2022 8  6 - 20 mg/dL Final   Creatinine, Ser 05/08/2022 0.68  0.44 - 1.00 mg/dL Final   Calcium 30/86/5784 9.0  8.9 - 10.3 mg/dL Final   Total Protein 69/62/9528 6.6  6.5 - 8.1 g/dL Final   Albumin 41/32/4401 3.7  3.5 - 5.0 g/dL Final   AST 02/72/5366 17  15 - 41 U/L Final   ALT 05/08/2022 17  0 - 44 U/L Final   Alkaline Phosphatase 05/08/2022 111  38 - 126 U/L Final   Total Bilirubin 05/08/2022 0.6  0.3 - 1.2 mg/dL Final   GFR, Estimated 05/08/2022 >60  >60 mL/min Final   Comment:  (NOTE) Calculated using the CKD-EPI Creatinine Equation (2021)    Anion gap 05/08/2022 8  5 - 15 Final   Performed at [facility], [address] [place], [place] [address]   Hgb A1c MFr Bld 05/08/2022 4.9  4.8 - 5.6 % Final   Comment: (NOTE) Pre diabetes:          5.7%-6.4%  Diabetes:              >6.4%  Glycemic control for   <7.0% adults with diabetes    Mean Plasma Glucose 05/08/2022 93.93  mg/dL Final   Performed at [facility], [address] [place], [place] [address]   Preg Test, Ur 05/08/2022 NEGATIVE  NEGATIVE Final   Performed at [facility], [address] [place], [place] [address]   POC Amphetamine UR 05/08/2022 None Detected  NONE DETECTED (Cut Off Level 1000 ng/mL) Final   POC Secobarbital (BAR) 05/08/2022 None Detected  NONE DETECTED (Cut Off Level 300 ng/mL) Final   POC Buprenorphine (BUP) 05/08/2022 None Detected  NONE DETECTED (Cut Off Level 10 ng/mL) Final   POC Oxazepam (BZO) 05/08/2022 None Detected  NONE DETECTED (Cut Off Level 300 ng/mL) Final   POC Cocaine UR 05/08/2022 None Detected  NONE DETECTED (Cut Off Level 300 ng/mL) Final   POC Methamphetamine UR 05/08/2022 Positive (A)  NONE DETECTED (Cut Off Level 1000 ng/mL) Final   POC Morphine 05/08/2022 None Detected  NONE DETECTED (Cut Off Level 300 ng/mL) Final   POC Methadone UR 05/08/2022 None Detected  NONE DETECTED (Cut Off Level 300 ng/mL) Final   POC Oxycodone UR 05/08/2022 None Detected  NONE DETECTED (Cut Off Level 100 ng/mL) Final   POC Marijuana UR 05/08/2022 None Detected  NONE DETECTED (Cut Off Level 50 ng/mL) Final   SARSCOV2ONAVIRUS 2 AG 05/08/2022 NEGATIVE  NEGATIVE Final   Comment: (NOTE) SARS-CoV-2 antigen NOT DETECTED.   Negative results are presumptive.  Negative results do not preclude SARS-CoV-2 infection and should not be used as the sole basis for treatment or other patient management decisions, including infection  control decisions, particularly in the  presence of clinical signs and  symptoms consistent with COVID-19, or in those who have been in contact with the virus.  Negative results must be combined with clinical observations, patient history, and epidemiological information. The expected result is Negative.  Fact Sheet for Patients: https://www.jennings-kim.com/  Fact Sheet for Healthcare Providers: https://alexander-rogers.biz/  This test is not yet approved or cleared by the [place] FDA and  has been authorized for detection and/or diagnosis of SARS-CoV-2 by FDA under an Emergency Use Authorization (EUA).  This EUA will remain in effect (meaning this test can be used) for the duration of  the COV                          ID-19 declaration under Section 564(b)(1) of the Act, 21 U.S.C. section 360bbb-3(b)(1), unless the authorization is terminated or revoked sooner.     Preg Test, Ur 05/08/2022 NEGATIVE  NEGATIVE Final   Comment:        THE SENSITIVITY OF THIS METHODOLOGY IS >24 mIU/mL    Cholesterol 05/08/2022 187  0 - 200 mg/dL Final   Triglycerides 60/45/4098 117  <150 mg/dL Final   HDL 11/91/4782 67  >40 mg/dL Final   Total CHOL/HDL Ratio 05/08/2022 2.8  RATIO Final   VLDL 05/08/2022 23  0 - 40 mg/dL Final   LDL Cholesterol 05/08/2022 97  0 - 99 mg/dL Final   Comment:        Total Cholesterol/HDL:CHD Risk Coronary Heart Disease Risk Table                     Men   Women  1/2 Average Risk   3.4   3.3  Average Risk       5.0   4.4  2 X Average Risk   9.6   7.1  3 X Average Risk  23.4   11.0        Use the calculated Patient Ratio above and the CHD Risk Table to determine the patient's CHD Risk.        ATP III CLASSIFICATION (LDL):  <100  mg/dL   Optimal  960-454  mg/dL   Near or Above                    Optimal  130-159  mg/dL   Borderline  098-119  mg/dL   High  >147     mg/dL   Very High Performed at [facility], [address], [place], [place] [address]    TSH  05/08/2022 3.264  0.350 - 4.500 uIU/mL Final   Comment: Performed by a 3rd Generation assay with a functional sensitivity of <=0.01 uIU/mL. Performed at [facility], [address] [place], [place] [address]   Admission on 03/20/2022, Discharged on 03/20/2022  Component Date Value Ref Range Status   WBC 03/20/2022 9.3  4.0 - 10.5 K/uL Final   RBC 03/20/2022 4.76  3.87 - 5.11 MIL/uL Final   Hemoglobin 03/20/2022 14.2  12.0 - 15.0 g/dL Final   HCT 65/78/4696 44.0  36.0 - 46.0 % Final   MCV 03/20/2022 92.4  80.0 - 100.0 fL Final   MCH 03/20/2022 29.8  26.0 - 34.0 pg Final   MCHC 03/20/2022 32.3  30.0 - 36.0 g/dL Final   RDW 29/52/8413 12.4  11.5 - 15.5 % Final   Platelets 03/20/2022 286  150 - 400 K/uL Final   nRBC 03/20/2022 0.0  0.0 - 0.2 % Final   Neutrophils Relative % 03/20/2022 56  % Final   Neutro Abs 03/20/2022 5.3  1.7 - 7.7 K/uL Final   Lymphocytes Relative 03/20/2022 32  % Final   Lymphs Abs 03/20/2022 3.0  0.7 - 4.0 K/uL Final   Monocytes Relative 03/20/2022 8  % Final   Monocytes Absolute 03/20/2022 0.7  0.1 - 1.0 K/uL Final   Eosinophils Relative 03/20/2022 2  % Final   Eosinophils Absolute 03/20/2022 0.2  0.0 - 0.5 K/uL Final   Basophils Relative 03/20/2022 1  % Final   Basophils Absolute 03/20/2022 0.1  0.0 - 0.1 K/uL Final   Immature Granulocytes 03/20/2022 1  % Final   Abs Immature Granulocytes 03/20/2022 0.06  0.00 - 0.07 K/uL Final   Performed at [facility], [address] [place], [place] [address]   Sodium 03/20/2022 140  135 - 145 mmol/L Final   Potassium 03/20/2022 4.2  3.5 - 5.1 mmol/L Final   Chloride 03/20/2022 106  98 - 111 mmol/L Final   CO2 03/20/2022 25  22 - 32 mmol/L Final   Glucose, Bld 03/20/2022 89  70 - 99 mg/dL Final   Glucose reference range applies only to samples taken after fasting for at least 8 hours.   BUN 03/20/2022 17  6 - 20 mg/dL Final   Creatinine, Ser 03/20/2022 0.64  0.44 - 1.00 mg/dL Final   Calcium 02/72/5366 9.2   8.9 - 10.3 mg/dL Final   Total Protein 44/10/4740 7.1  6.5 - 8.1 g/dL Final   Albumin 59/56/3875 4.1  3.5 - 5.0 g/dL Final   AST 64/33/2951 18  15 - 41 U/L Final   ALT 03/20/2022 26  0 - 44 U/L Final   Alkaline Phosphatase 03/20/2022 86  38 - 126 U/L Final   Total Bilirubin 03/20/2022 0.4  0.3 - 1.2 mg/dL Final   GFR, Estimated 03/20/2022 >60  >60 mL/min Final   Comment: (NOTE) Calculated using the CKD-EPI Creatinine Equation (2021)    Anion gap 03/20/2022 9  5 - 15 Final   Performed at [facility], [address] [place], [place] [address]   Color, Urine 03/20/2022 STRAW (A)  YELLOW Final   APPearance 03/20/2022 CLEAR  CLEAR Final   Specific Gravity, Urine 03/20/2022 1.008  1.005 - 1.030 Final   pH 03/20/2022 6.0  5.0 - 8.0 Final   Glucose, UA 03/20/2022 NEGATIVE  NEGATIVE mg/dL Final   Hgb urine dipstick 03/20/2022 NEGATIVE  NEGATIVE Final   Bilirubin Urine 03/20/2022 NEGATIVE  NEGATIVE Final   Ketones, ur 03/20/2022 NEGATIVE  NEGATIVE mg/dL Final   Protein, ur 60/45/4098 NEGATIVE  NEGATIVE mg/dL Final   Nitrite 11/91/4782 NEGATIVE  NEGATIVE Final   Leukocytes,Ua 03/20/2022 NEGATIVE  NEGATIVE Final   Performed at [facility], [address] [place], [place] [address]  Admission on 03/10/2022, Discharged on 03/20/2022  Component Date Value Ref Range Status   Magnesium 03/11/2022 1.9  1.7 - 2.4 mg/dL Final   Performed at [facility], [address], [place], [place] [address]   Hepatitis B Surface Ag 03/11/2022 NON REACTIVE  NON REACTIVE Final   HCV Ab 03/11/2022 NON REACTIVE  NON REACTIVE Final   Comment: (NOTE) Nonreactive HCV antibody screen is consistent with no HCV infections,  unless recent infection is suspected or other evidence exists to indicate HCV infection.     Hep A IgM 03/11/2022 NON REACTIVE  NON REACTIVE Final   Hep B C IgM 03/11/2022 NON REACTIVE  NON REACTIVE Final   Performed at [facility], [address],  [place], [place] [address]   Vit D, 25-Hydroxy 03/11/2022 31.00  30 - 100 ng/mL Final   Comment: (NOTE) Vitamin D deficiency has been defined by the Institute of Medicine  and an Endocrine Society practice guideline as a level of serum 25-OH  vitamin D less than 20 ng/mL (1,2). The Endocrine Society went on to  further define vitamin D insufficiency as a level between 21 and 29  ng/mL (2).  1. IOM (Institute of Medicine). 2010. Dietary reference intakes for  calcium and D. [place]: [employer]. 2. Holick MF, Binkley [place], Bischoff-Ferrari HA, et al. Evaluation,  treatment, and prevention of vitamin D deficiency: an Endocrine  Society clinical practice guideline, JCEM. 2011 Jul; 96(7): 1911-30.  Performed at [facility], [address], [place], [place] [address]    Vitamin B-12 03/11/2022 201  180 - 914 pg/mL Final   Comment: (NOTE) This assay is not validated for testing neonatal or myeloproliferative syndrome specimens for Vitamin B12 levels. Performed at [facility], [address] [place], [place] [address]    TSH 03/11/2022 3.487  0.350 - 4.500 uIU/mL Final   Comment: Performed by a 3rd Generation assay with a functional sensitivity of <=0.01 uIU/mL. Performed at [facility], [address], [place], [place] [address]    RPR Ser Ql 03/11/2022 NON REACTIVE  NON REACTIVE Final   Performed at [facility], [address] [place], [place] [address]   Cholesterol 03/13/2022 194  0 - 200 mg/dL Final   Triglycerides 66/44/0347 53  <150 mg/dL Final   HDL 42/59/5638 54  >40 mg/dL Final   Total CHOL/HDL Ratio 03/13/2022 3.6  RATIO Final   VLDL 03/13/2022 11  0 - 40 mg/dL Final   LDL Cholesterol 03/13/2022 129 (H)  0 - 99 mg/dL Final   Comment:        Total Cholesterol/HDL:CHD Risk Coronary Heart Disease Risk Table  Men   Women  1/2 Average Risk   3.4   3.3  Average Risk       5.0   4.4  2 X  Average Risk   9.6   7.1  3 X Average Risk  23.4   11.0        Use the calculated Patient Ratio above and the CHD Risk Table to determine the patient's CHD Risk.        ATP III CLASSIFICATION (LDL):  <100     mg/dL   Optimal  119-147  mg/dL   Near or Above                    Optimal  130-159  mg/dL   Borderline  829-562  mg/dL   High  >130     mg/dL   Very High Performed at [facility], [address], [place], [place] [address]    Hgb A1c MFr Bld 03/13/2022 5.1  4.8 - 5.6 % Final   Comment: (NOTE) Pre diabetes:          5.7%-6.4%  Diabetes:              >6.4%  Glycemic control for   <7.0% adults with diabetes    Mean Plasma Glucose 03/13/2022 99.67  mg/dL Final   Performed at [facility], [address], [place], [place] [address]   Sodium 03/14/2022 138  135 - 145 mmol/L Final   Potassium 03/14/2022 4.3  3.5 - 5.1 mmol/L Final   Chloride 03/14/2022 107  98 - 111 mmol/L Final   CO2 03/14/2022 26  22 - 32 mmol/L Final   Glucose, Bld 03/14/2022 89  70 - 99 mg/dL Final   Glucose reference range applies only to samples taken after fasting for at least 8 hours.   BUN 03/14/2022 17  6 - 20 mg/dL Final   Creatinine, Ser 03/14/2022 0.65  0.44 - 1.00 mg/dL Final   Calcium 95/28/4132 9.1  8.9 - 10.3 mg/dL Final   Total Protein 44/08/270 6.5  6.5 - 8.1 g/dL Final   Albumin 53/66/4403 3.6  3.5 - 5.0 g/dL Final   AST 47/42/5956 14 (L)  15 - 41 U/L Final   ALT 03/14/2022 15  0 - 44 U/L Final   Alkaline Phosphatase 03/14/2022 71  38 - 126 U/L Final   Total Bilirubin 03/14/2022 0.3  0.3 - 1.2 mg/dL Final   GFR, Estimated 03/14/2022 >60  >60 mL/min Final   Comment: (NOTE) Calculated using the CKD-EPI Creatinine Equation (2021)    Anion gap 03/14/2022 5  5 - 15 Final   Performed at [facility], [address], [place], [place] [address]   Lipase 03/14/2022 32  11 - 51 U/L Final   Performed at [facility], [address], [place], [place] [address]   WBC 03/14/2022 8.8  4.0 - 10.5 K/uL Final   RBC 03/14/2022 4.37  3.87 - 5.11 MIL/uL Final   Hemoglobin 03/14/2022 13.2  12.0 - 15.0 g/dL Final   HCT 51/88/4166 39.9  36.0 - 46.0 % Final   MCV 03/14/2022 91.3  80.0 - 100.0 fL Final   MCH 03/14/2022 30.2  26.0 - 34.0 pg Final   MCHC 03/14/2022 33.1  30.0 - 36.0 g/dL Final   RDW 02/01/1600 12.4  11.5 - 15.5 % Final   Platelets 03/14/2022 230  150 - 400 K/uL Final   nRBC 03/14/2022 0.0  0.0 - 0.2 % Final   Neutrophils Relative % 03/14/2022 52  % Final   Neutro Abs 03/14/2022 4.7  1.7 - 7.7 K/uL Final   Lymphocytes Relative 03/14/2022 36  % Final   Lymphs Abs 03/14/2022 3.1  0.7 - 4.0 K/uL Final   Monocytes Relative 03/14/2022 9  % Final   Monocytes Absolute 03/14/2022 0.8  0.1 - 1.0 K/uL Final   Eosinophils Relative 03/14/2022 2  % Final   Eosinophils Absolute 03/14/2022 0.2  0.0 - 0.5 K/uL Final   Basophils Relative 03/14/2022 0  % Final   Basophils Absolute 03/14/2022 0.0  0.0 - 0.1 K/uL Final   Immature Granulocytes 03/14/2022 1  % Final   Abs Immature Granulocytes 03/14/2022 0.04  0.00 - 0.07 K/uL Final   Performed at [facility], [address], [place], [place] [address]   Color, Urine 03/14/2022 YELLOW  YELLOW Final   APPearance 03/14/2022 HAZY (A)  CLEAR Final   Specific Gravity, Urine 03/14/2022 1.012  1.005 - 1.030 Final   pH 03/14/2022 6.0  5.0 - 8.0 Final   Glucose, UA 03/14/2022 NEGATIVE  NEGATIVE mg/dL Final   Hgb urine dipstick 03/14/2022 NEGATIVE  NEGATIVE Final   Bilirubin Urine 03/14/2022 NEGATIVE  NEGATIVE Final   Ketones, ur 03/14/2022 NEGATIVE  NEGATIVE mg/dL Final   Protein, ur 98/06/9146 NEGATIVE  NEGATIVE mg/dL Final   Nitrite 82/95/6213 NEGATIVE  NEGATIVE Final   Leukocytes,Ua 03/14/2022 LARGE (A)  NEGATIVE Final   RBC / HPF 03/14/2022 0-5  0 - 5 RBC/hpf Final   WBC, UA 03/14/2022 21-50  0 - 5 WBC/hpf Final   Bacteria, UA 03/14/2022 RARE (A)  NONE SEEN  Final   Squamous Epithelial / LPF 03/14/2022 0-5  0 - 5 Final   Performed at [facility], [address], [place], [place] [address]   Preg Test, Ur 03/14/2022 NEGATIVE  NEGATIVE Final   Comment:        THE SENSITIVITY OF THIS METHODOLOGY IS >20 mIU/mL. Performed at [facility], [address] [place], [place] [address]    Specimen Description 03/14/2022    Final                   Value:URINE, CLEAN CATCH Performed at [facility], [address] [place], [place] [address]    Special Requests 03/14/2022    Final                   Value:NONE Performed at [facility], [address] [place], [place] [address]    Culture 03/14/2022 MULTIPLE SPECIES PRESENT, SUGGEST RECOLLECTION (A)   Final   Report Status 03/14/2022 03/16/2022 FINAL   Final  There may be more visits with results that are not included.    Blood Alcohol level:  Lab Results  Component Value Date   ETH 37 (H) 07/17/2022   ETH 18 (H) 06/06/2022    Metabolic Disorder Labs: Lab Results  Component Value Date   HGBA1C 4.9 05/08/2022   MPG 93.93 05/08/2022   MPG 99.67 03/13/2022   No results found for: "PROLACTIN" Lab Results  Component Value Date   CHOL 187 05/08/2022   TRIG 117 05/08/2022   HDL 67 05/08/2022   CHOLHDL 2.8 05/08/2022   VLDL 23 05/08/2022   LDLCALC 97 05/08/2022   LDLCALC 129 (H) 03/13/2022    Therapeutic Lab Levels: No results found for: "LITHIUM" No results found for: "VALPROATE" No  results found for: "CBMZ"  Physical Findings   AIMS    Flowsheet Row ED to Hosp-Ad[facility] (Dis[facility]) [facility]  AIMS Total Score 0      AUDIT    Flowsheet Row ED to Hosp-Admission (Discharged) [facility]  Alcohol Use Disorder Identification Test Final Score (AUDIT) 32      CAGE-AID    Flowsheet Row ED from  01/17/2022 in [facility]  CAGE-AID Score 1      GAD-7    Flowsheet Row Office Visit from 03/12/2021 in [facility]  Total GAD-7 Score 20      PHQ2-9    Flowsheet Row ED from 07/17/2022 in [facility] ED from 05/08/2022 in [facility] Office Visit from 03/12/2021 in [facility]  PHQ-2 Total Score PHQ-9 Total Score Flowsheet Row ED from 07/17/2022 in [facility] Most recent reading at 07/17/2022  6:11 PM ED from 07/15/2022 in [facility] Most recent reading at 07/15/2022  8:29 AM Admission (Pending) from OP [facility] Most recent reading at 07/15/2022 12:36 AM  C-SSRS RISK CATEGORY No Risk No Risk No Risk        Musculoskeletal  Strength & Muscle Tone: within normal limits Gait & Station: normal Patient leans: N/A  Psychiatric Specialty Exam  Presentation  General Appearance:  Appropriate for Environment  Eye Contact: Fair  Speech: Clear and Coherent  Speech Volume: Normal  Handedness: Right   Mood and Affect  Mood: Depressed  Affect: Congruent   Thought Process  Thought Processes: Linear  Descriptions of Associations:Circumstantial  Orientation:Full (Time, Place and Person)  Thought Content:Logical  Diagnosis of Schizophrenia or Schizoaffective disorder in past: No  Duration of Psychotic Symptoms: Greater than six months   Hallucinations:Hallucinations: None  Ideas of Reference:None  Suicidal Thoughts:Suicidal Thoughts: No  Homicidal Thoughts:Homicidal Thoughts: No   Sensorium  Memory: Immediate Fair; Recent Poor  Judgment: Poor  Insight: None   Executive Functions  Concentration: Poor  Attention Span: Fair  Recall: [employer] of  Knowledge: Fair  Language: Fair   Psychomotor Activity  Psychomotor Activity: Psychomotor Activity: Normal   Assets  Assets: Resilience   Sleep  Sleep: Sleep: Poor   No data recorded  Physical Exam  Physical Exam HENT:     Head: Normocephalic and atraumatic.  Pulmonary:     Effort: Pulmonary effort is normal.  Neurological:     Mental Status: She is alert and oriented to person, place, and time.    Review of Systems  Psychiatric/Behavioral:  Positive for depression. Negative for hallucinations and suicidal ideas.    Blood pressure 112/67, pulse 96, temperature 97.9 F (36.6 C), temperature source Tympanic, resp. rate 18, height  (1.6 m), weight 110 lb (49.9 kg), SpO2 96 %. Body mass index is 19.49 kg/m.  Treatment Plan Summary: Daily contact with patient to assess and evaluate symptoms and progress in treatment and Medication management  Patient is no longer endorsing SI or VH since her medication has been restarted, but today endorses auditory hallucinations asking for an increase in her medications; however she is unable to fully describe the nature of these voices and does not appear distressed by them.  In previous days, patient was fixated  on having schizophrenia, but today does not inquire just reports that she is having auditory hallucinations that do not seem consistent with schizophrenia, and she does not appear psychotic nor to respond to internal stimuli. This will likely improve with Abilify and Lexapro that were restarted over the weekend. Patient denies nightmares today.   MDD, recurrent, with psychotic features - Continue Lexapro 10 mg daily, as 10 mg is a therapeutic dose. Advised patient  - Increase to Abilify 10 mg daily due to mood stabilizing properties, as patient previously stabilized on 15 mg. Will advise to continue titration outpatient.       #Alcohol use disorder -Completed ativan taper for alcohol withdrawal -Completed CIWA: Last  CIWA-0 (07/20/2022) -Continue PRNs:  Vistaril 25 mg po 3 times daily prn for anxiety Zofran 4 mg every 6 hours as needed for nausea or vomiting Imodium 2-4 mg po as needed for diarrhea  Thiamine 100 mg p.o. daily for nutritional supplementation Multivitamin 1 tablet by mouth daily for nutritional supplementation - Start Naltrexone 25mg  daily   #Chlamydia infection Patient informed of positive chlamydia test, treatment, and need for follow-up after treatment. It was also discussed with patient to inform partners of her positive test result and need to get treatment. -Continue doxycycline 100mg  BID x7 days (12/15-12/22)   Dispo: [name] tomorrow  Lamar Sprinkles, MD PGY-2 07/21/2022 8:01 AM

## 2022-07-21 NOTE — ED Notes (Signed)
Pt is in the bed sleeping. Respirations are even and unlabored. No acute distress noted. Will continue to monitor for safety. 

## 2022-07-21 NOTE — ED Notes (Signed)
Patient A&Ox4. Denies intent to harm self/others when asked. Denies A/VH. Patient denies any physical complaints when asked. No acute distress noted. Support and encouragement provided. Routine safety checks conducted according to facility protocol. Encouraged patient to notify staff if thoughts of harm toward self or others arise. Patient verbalize understanding and agreement. Will continue to monitor for safety.    

## 2022-07-21 NOTE — ED Notes (Signed)
Patient complained of pain in her abdomen. Stated that she has had this pain before.pain is 7 out of 10. Patient was given Tylenol with her bed time medications. Patient is currently sleeping with no signs of distress. Will continue to monitor for safety.

## 2022-07-21 NOTE — ED Notes (Signed)
Patient attended the AA Group Meeting. 

## 2022-07-22 ENCOUNTER — Other Ambulatory Visit: Payer: Self-pay

## 2022-07-22 ENCOUNTER — Emergency Department (HOSPITAL_COMMUNITY): Payer: Self-pay

## 2022-07-22 ENCOUNTER — Emergency Department (HOSPITAL_COMMUNITY)
Admission: EM | Admit: 2022-07-22 | Discharge: 2022-07-23 | Disposition: A | Discharge status: Home / Self Care | Payer: Self-pay | Attending: Emergency Medicine | Admitting: Emergency Medicine

## 2022-07-22 DIAGNOSIS — F1020 Alcohol dependence, uncomplicated: Secondary | ICD-10-CM | POA: Insufficient documentation

## 2022-07-22 DIAGNOSIS — F1090 Alcohol use, unspecified, uncomplicated: Secondary | ICD-10-CM

## 2022-07-22 DIAGNOSIS — Z1152 Encounter for screening for COVID-19: Secondary | ICD-10-CM | POA: Diagnosis not present

## 2022-07-22 DIAGNOSIS — N9489 Other specified conditions associated with female genital organs and menstrual cycle: Secondary | ICD-10-CM | POA: Insufficient documentation

## 2022-07-22 DIAGNOSIS — Z20822 Contact with and (suspected) exposure to covid-19: Secondary | ICD-10-CM | POA: Insufficient documentation

## 2022-07-22 DIAGNOSIS — F1911 Other psychoactive substance abuse, in remission: Secondary | ICD-10-CM | POA: Diagnosis not present

## 2022-07-22 DIAGNOSIS — Z7901 Long term (current) use of anticoagulants: Secondary | ICD-10-CM | POA: Insufficient documentation

## 2022-07-22 DIAGNOSIS — F10230 Alcohol dependence with withdrawal, uncomplicated: Secondary | ICD-10-CM | POA: Diagnosis not present

## 2022-07-22 DIAGNOSIS — M545 Low back pain, unspecified: Secondary | ICD-10-CM | POA: Insufficient documentation

## 2022-07-22 LAB — CBC
HCT: 49 % — ABNORMAL HIGH (ref 36.0–46.0)
Hemoglobin: 15.8 g/dL — ABNORMAL HIGH (ref 12.0–15.0)
MCH: 30 pg (ref 26.0–34.0)
MCHC: 32.2 g/dL (ref 30.0–36.0)
MCV: 93 fL (ref 80.0–100.0)
Platelets: 276 10*3/uL (ref 150–400)
RBC: 5.27 MIL/uL — ABNORMAL HIGH (ref 3.87–5.11)
RDW: 13.7 % (ref 11.5–15.5)
WBC: 7.2 10*3/uL (ref 4.0–10.5)
nRBC: 0 % (ref 0.0–0.2)

## 2022-07-22 LAB — RESP PANEL BY RT-PCR (RSV, FLU A&B, COVID)  RVPGX2
Influenza A by PCR: NEGATIVE
Influenza B by PCR: NEGATIVE
Resp Syncytial Virus by PCR: NEGATIVE
SARS Coronavirus 2 by RT PCR: NEGATIVE

## 2022-07-22 LAB — COMPREHENSIVE METABOLIC PANEL
ALT: 18 U/L (ref 0–44)
AST: 21 U/L (ref 15–41)
Albumin: 4 g/dL (ref 3.5–5.0)
Alkaline Phosphatase: 67 U/L (ref 38–126)
Anion gap: 8 (ref 5–15)
BUN: 18 mg/dL (ref 6–20)
CO2: 22 mmol/L (ref 22–32)
Calcium: 9.3 mg/dL (ref 8.9–10.3)
Chloride: 106 mmol/L (ref 98–111)
Creatinine, Ser: 0.61 mg/dL (ref 0.44–1.00)
GFR, Estimated: >60 mL/min (ref >60)
Glucose, Bld: 87 mg/dL (ref 70–99)
Potassium: 4.5 mmol/L (ref 3.5–5.1)
Sodium: 136 mmol/L (ref 135–145)
Total Bilirubin: 0.1 mg/dL — ABNORMAL LOW (ref 0.3–1.2)
Total Protein: 7.1 g/dL (ref 6.5–8.1)

## 2022-07-22 LAB — I-STAT BETA HCG BLOOD, ED (MC, WL, AP ONLY): I-stat hCG, quantitative: <5 m[IU]/mL (ref <5)

## 2022-07-22 MED ORDER — ONDANSETRON 4 MG PO TBDP
4.0000 mg | ORAL_TABLET | Freq: Once | ORAL | Status: AC
  Administered 2022-07-22: 4 mg via ORAL
  Filled 2022-07-22: qty 1

## 2022-07-22 MED ORDER — IOHEXOL 350 MG/ML SOLN
60.0000 mL | Freq: Once | INTRAVENOUS | Status: AC | PRN
  Administered 2022-07-22: 60 mL via INTRAVENOUS

## 2022-07-22 NOTE — ED Notes (Signed)
Patient alert and oriented x 3. Denies SI/HI/AVH. Denies intent or plan to harm self or others. Routine conducted according to faculty protocol. Encourage patient to notify staff with any needs or concerns. Patient verbalized agreement and understanding. Will continue to monitor for safety. 

## 2022-07-22 NOTE — ED Notes (Signed)
Patient A&O x 4, ambulatory. Patient discharged in no acute distress. Patient denied SI/HI, A/VH upon discharge. Patient verbalized understanding of all discharge instructions explained by staff, to include follow up appointments, RX's and safety plan. Patient reported mood 10/10.  Pt belongings returned to patient from locker # 15 intact. Patient escorted to lobby via staff for transport to [facility] . Safety maintained

## 2022-07-22 NOTE — ED Triage Notes (Signed)
Pt reports being d/c from [facility] today and presenting to [facility], who would not accept her because of her other medical problems. Patient having back pain and states she's withdrawing from alcohol. Last drink Thursday and drinks "as much as possible" every day.
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
## 2022-07-22 NOTE — ED Notes (Signed)
Patient medication a given early due to him going to [facility]

## 2022-07-22 NOTE — ED Notes (Signed)
Pt sleeping@this time. Breathing even and unlabored. Will continue to monitor for safety 

## 2022-07-23 ENCOUNTER — Ambulatory Visit (HOSPITAL_COMMUNITY)
Admission: EM | Admit: 2022-07-23 | Discharge: 2022-07-23 | Disposition: A | Discharge status: Home / Self Care | Payer: No Payment, Other | Attending: Nurse Practitioner | Admitting: Nurse Practitioner

## 2022-07-23 ENCOUNTER — Encounter (HOSPITAL_COMMUNITY): Payer: Self-pay | Admitting: Emergency Medicine

## 2022-07-23 DIAGNOSIS — Z765 Malingerer [conscious simulation]: Secondary | ICD-10-CM | POA: Insufficient documentation

## 2022-07-23 DIAGNOSIS — F4310 Post-traumatic stress disorder, unspecified: Secondary | ICD-10-CM | POA: Insufficient documentation

## 2022-07-23 DIAGNOSIS — R45851 Suicidal ideations: Secondary | ICD-10-CM | POA: Insufficient documentation

## 2022-07-23 DIAGNOSIS — F411 Generalized anxiety disorder: Secondary | ICD-10-CM | POA: Insufficient documentation

## 2022-07-23 DIAGNOSIS — F329 Major depressive disorder, single episode, unspecified: Secondary | ICD-10-CM | POA: Insufficient documentation

## 2022-07-23 DIAGNOSIS — Z86711 Personal history of pulmonary embolism: Secondary | ICD-10-CM | POA: Insufficient documentation

## 2022-07-23 DIAGNOSIS — Z5900 Homelessness unspecified: Secondary | ICD-10-CM | POA: Insufficient documentation

## 2022-07-23 DIAGNOSIS — Z86718 Personal history of other venous thrombosis and embolism: Secondary | ICD-10-CM | POA: Insufficient documentation

## 2022-07-23 MED ORDER — RIVAROXABAN (XARELTO) VTE STARTER PACK (15 & 20 MG): ORAL_TABLET | ORAL | 0 refills | Status: DC

## 2022-07-23 NOTE — Discharge Instructions (Addendum)

## 2022-07-23 NOTE — Discharge Instructions (Addendum)
Substance Abuse Treatment Programs ° °Intensive Outpatient Programs °[facility]     °[address]      °[place], [place]                   °[phone]      ° °[facility] °[address] °[place], [place] °[phone] ° °[facility]     °(Inpatient and outpatient)     °[address]           °[phone]   ° °[facility] °[phone] (Suboxone and Methadone) ° °[address]      °[place], [place] [address]      °336-882-2125      ° °[address] °[place], [place] °852-3033 ° °[facility] (Outpatient/Inpatient, Chemical)    °(insurance only) [phone]      °       °[facility] (Groups & Residential) °[place], [place] °336-389-1413 ° °   °[facility]     °[address]     °[place], [place]      °[phone]      ° °[facility] (for caregivers and family) °[address] °[place], [place] °336-299-4655 ° ° ° ° ° °Residential Treatment Programs °[facility]      °[address], [place], [place] [address]  °([phone]      ° °[facility] °[address] [place], [place] [address] °[phone] ° °[facility]        °336-248-8914      ° °[facility] °1-800-659-3381 ° °[facility]             °[address]                                         °[place], [place]                                                °877-615-2722 or 336-784-9470                              ° °[facility] [place] °[address] °[place], [address] °1.877.941.8954 ° °[facility]    °[address]      °[place], [place]     °[phone]      ° °[facility] °[address] °[place], [place] °336-285-9073 ° °[name]   °[address]     °[place], [place] [address]     °336-899-1550      °Admissions: 8am-3pm M-F ° °[facility] °[address] °[place],  [place] °336-227-7417 ° °[facility]: Residential Program (90 Days)   °[place], [place]      °336-725-8389 or 800-758-6077    ° °[facility] °[place], [place] °(Walk in Hours over the weekend or by referral) ° °[facility] °[address], [place],  [address] °(336) [phone] ° °Crisis Mobile: [facility]:  1-877-626-1772 (for crisis response 24 hours a day) °[facility] Hotline:      1-800-256-2452 °Outpatient Psychiatry and Counseling ° °[facility]: Mobile Crisis   Management 24 hours:  1-877-626-1772 ° °[facility] sliding scale fee and walk in schedule: M-F 8am-12pm/1pm-3pm °[address]  °[place], [place] [address] °[phone] ° °[facility] °[address] °[place], [place] [address] °336-703-9650 ° °[facility] ([facility] known [facility])- new patient walk-in appointments available Monday - Friday 8am -3pm.          °[address] °[place], [place] [address] °336-676-6840 or crisis line- 336-676-6905 ° °[facility] Intensive Outpatient Therapy Program °[address] °[place], [place] [address] °336-832-9804 ° °[facility]                  °Crisis Services      °[phone]      °[address]     °[place], [place] [address]                ° °[facility]   °[facility] °[phone] °[address] °[place], [place] [address] ° ° °[facility]          °[address]  °[place], [place] [address]       °([phone] [phone] ° °[facility] °[address] °[place], [place] [address] °336-292-1510 ° °[facility]    °[address]    °[place], [place] [address]     °336-632-3505      ° °Parish McKinney, MD     °[address]     °[place] [address]     °336-282-1251     °  °[facility] °[address] °[place] [address] ° °[facility]     °[address]     °[place], [place]      °336-542-2076      ° °[facility] °Shamsher Ahluwalia, MD °[address] °[place], [place] [address] °336-420-9558 ° °[facility]     °[address]     °[place], [place] [address]     °336-274-1237      ° °[facility] °[address] °[place], [place] [address] °336-854-4450 °Resources for Temporary Residential Assistance/Crisis Centers ° °[facility] °[facility] °M-F 8am-3pm   °[address] [place], [place] [address]   336-332-0824 °Services include: laundry, barbering, support groups, case management, phone  & computer access, showers, AA/NA mtgs, mental health/substance abuse nurse, job skills class, disability information, VA assistance, spiritual classes, etc.  ° °HOMELESS SHELTERS ° °[facility]     °[facility]   °[address], [place]     °336.271.5959       °       °[facility] (women and children)       °[address] °[place], [place] [address] °336-275-0820 °[email or web address] for application and process °Application Required ° °[facility]   °[address]    °[place] [address]     °[phone]       °             °[facility] °[address] °,  [address] °336.273.5572 °336-235-0363(schedule application appt.) °Application Required ° °[facility] (women only)    °[address]     °[place],  [address]     °336-884-1039      °  Intake starts 6pm daily Need valid ID, SSC, & Police report [employer] [address] [place], [place] [phone] Application Required  [facility] (men only)     [address]      [place], [place]     [phone]       [facility] (Pregnant women only) [address] [place], [place] [phone]  [facility]      [address]      [place], [place] [address]     [phone]             [facility] [address] [place], [place] [phone] 90 day commitment/SA/Application process  [facility](men only)     [address]     [place], [place]     [phone]       Check-in at [facility] [address] [place], [place] [address] [phone] Men/Women/Women and Children must be there by 7 pm  [facility] [place], [place] [phone]                At this time patient does not require any further medical management for alcohol use disorder and she is safe to be placed and [name]  She has been given a prescription for 30 days of her blood thinner

## 2022-07-23 NOTE — ED Provider Notes (Signed)
[facility] EMERGENCY DEPARTMENT Provider Note   CSN: [id number] Arrival date & time: 07/22/22  1414     History  Chief Complaint  Patient presents with   Back Pain    Kristina Owens is a 56 y.o. female.   Back Pain Patient with history of alcohol use disorder, previous PE presents with multiple complaints. She reports chronic shortness of breath.  She also reports chronic low back pain.  No focal weakness.  She attempted to go to [facility] today for residential treatment, but was refused as she was not on medications for PE and they felt that she needed management of her alcohol use disorder.      Home Medications Prior to Admission medications   Medication Sig Start Date End Date Taking? Authorizing Provider  RIVAROXABAN Carlena Hurl) VTE STARTER PACK (15 & 20 MG) Follow package directions: Take one 15mg  tablet by mouth twice a day. On day 22, switch to one 20mg  tablet once a day. Take with food. 07/23/22  Yes , MD  ARIPiprazole (ABILIFY) 10 MG tablet Take 1 tablet (10 mg total) by mouth daily. 07/22/22 08/21/22  07/24/22, MD  doxycycline (VIBRA-TABS) 100 MG tablet Take 1 tablet (100 mg total) by mouth every 12 (twelve) hours for 4 days. 07/21/22 07/25/22  07/23/22, MD  escitalopram (LEXAPRO) 10 MG tablet Take 1 tablet (10 mg total) by mouth daily. 07/22/22 08/21/22  07/24/22, MD  hydrOXYzine (ATARAX) 25 MG tablet Take 1 tablet (25 mg total) by mouth 3 (three) times daily as needed for anxiety. 07/21/22   Lamar Sprinkles, MD  naltrexone (DEPADE) 50 MG tablet Take 1 tablet (50 mg total) by mouth daily. 07/22/22 08/21/22  07/24/22, MD  nicotine (NICODERM CQ - DOSED IN MG/24 HOURS) 21 mg/24hr patch Place 1 patch (21 mg total) onto the skin daily. 07/22/22   Lamar Sprinkles, MD      Allergies    Patient has no known allergies.    Review of Systems   Review of Systems  Musculoskeletal:  Positive for back pain.     Physical Exam Updated Vital Signs BP 103/68   Pulse 64   Temp 97.7 F (36.5 C) (Oral)   Resp 16   SpO2 98%  Physical Exam CONSTITUTIONAL: Disheveled, no acute distress HEAD: Normocephalic/atraumatic EYES: EOMI/PERRL ENMT: Mucous membranes moist NECK: supple no meningeal signs SPINE/BACK:entire spine nontender CV: S1/S2 noted, no murmurs/rubs/gallops noted LUNGS: Lungs are clear to auscultation bilaterally, no apparent distress ABDOMEN: soft, nontender NEURO: Pt is awake/alert/appropriate, moves all extremitiesx4.  No facial droop.  Moves all extremities without any difficulty No tremor EXTREMITIES: pulses normal/equal, full ROM SKIN: warm, color normal PSYCH: no abnormalities of mood noted, alert and oriented to situation  ED Results / Procedures / Treatments   Labs (all labs ordered are listed, but only abnormal results are displayed) Labs Reviewed  COMPREHENSIVE METABOLIC PANEL - Abnormal; Notable for the following components:      Result Value   Total Bilirubin 0.1 (*)    All other components within normal limits  CBC - Abnormal; Notable for the following components:   RBC 5.27 (*)    Hemoglobin 15.8 (*)    HCT 49.0 (*)    All other components within normal limits  RESP PANEL BY RT-PCR (RSV, FLU A&B, COVID)  RVPGX2  I-STAT BETA HCG BLOOD, ED ([facility], [facility], [facility] ONLY)    EKG EKG Interpretation  Date/Time:  Tuesday July 22 2022 15:02:33 EST Ventricular Rate:  81 PR Interval:  120 QRS Duration: 70 QT Interval:  344 QTC Calculation: 399 R Axis:   56 Text Interpretation: Normal sinus rhythm Normal ECG Confirmed by Zadie Rhine ([id number]) on 07/23/2022 1:10:12 AM  Radiology CT Angio Chest PE W and/or Wo Contrast  Result Date: 07/22/2022 CLINICAL DATA:  Left back pain and shortness of breath. Clinical concern for pulmonary embolus. EXAM: CT ANGIOGRAPHY CHEST WITH CONTRAST TECHNIQUE: Multidetector CT imaging of the chest was performed using the standard protocol  during bolus administration of intravenous contrast. Multiplanar CT image reconstructions and MIPs were obtained to evaluate the vascular anatomy. RADIATION DOSE REDUCTION: This exam was performed according to the departmental dose-optimization program which includes automated exposure control, adjustment of the mA and/or kV according to patient size and/or use of iterative reconstruction technique. CONTRAST:  65mL OMNIPAQUE IOHEXOL 350 MG/ML SOLN COMPARISON:  01/07/2022 FINDINGS: Cardiovascular: The heart size is normal. No substantial pericardial effusion. Mild atherosclerotic calcification is noted in the wall of the thoracic aorta. There is no filling defect within the opacified pulmonary arteries to suggest the presence of an acute pulmonary embolus. Mediastinum/Nodes: No mediastinal lymphadenopathy. There is no hilar lymphadenopathy. The esophagus has normal imaging features. There is no axillary lymphadenopathy. Lungs/Pleura: No suspicious pulmonary nodule or mass. No focal airspace consolidation. No pleural effusion. Subsegmental atelectasis noted posterior left lower lobe. Upper Abdomen: Unremarkable. Musculoskeletal: No worrisome lytic or sclerotic osseous abnormality. Review of the MIP images confirms the above findings. IMPRESSION: 1. No CT evidence for acute pulmonary embolus. 2. Subsegmental atelectasis posterior left lower lobe. 3.  Aortic Atherosclerosis (ICD10-I70.0). Electronically Signed   By: Kennith Center M.D.   On: 07/22/2022 17:44   DG Chest 1 View  Result Date: 07/22/2022 CLINICAL DATA:  Left-sided back pain, chronic shortness of breath, history of pulmonary embolus EXAM: CHEST  1 VIEW COMPARISON:  07/11/2022 FINDINGS: Single frontal view of the chest demonstrates an unremarkable cardiac silhouette. No airspace disease, effusion, or pneumothorax. No acute bony abnormalities. IMPRESSION: 1. No acute intrathoracic process. Electronically Signed   By: Sharlet Salina M.D.   On: 07/22/2022  15:38    Procedures Procedures    Medications Ordered in ED Medications  ondansetron (ZOFRAN-ODT) disintegrating tablet 4 mg (4 mg Oral Given 07/22/22 1515)  iohexol (OMNIPAQUE) 350 MG/ML injection 60 mL (60 mLs Intravenous Contrast Given 07/22/22 1735)    ED Course/ Medical Decision Making/ A&P                           Medical Decision Making Risk Prescription drug management.   Patient presents for 16th ER visit in 6 months. She attempted to go to residential treatment but was refused.  At this time she has no signs of alcohol withdrawal.  She reports has been up to 5-6 days since her last drink.  There is no tremor, her vitals are appropriate Her mental status is appropriate.  Patient also reports she is supposed to be on lifelong anticoagulation.  CT chest was already performed that was negative for PE. I did call [name] to confirm their requirements for her.  I have noted in her paperwork that she is not undergoing alcohol withdrawal and does not need to be medically admitted.  I have also written her prescription for Xarelto which she states she has been on previously. Patient understands that this medication is high risk and that she cannot abuse alcohol while taking this medicine  Final Clinical Impression(s) / ED Diagnoses Final diagnoses:  Low back pain, unspecified back pain laterality, unspecified chronicity, unspecified whether sciatica present  Alcohol use disorder  Chronic anticoagulation    Rx / DC Orders ED Discharge Orders          Ordered    RIVAROXABAN (XARELTO) VTE STARTER PACK (15 & 20 MG)        07/23/22 0245              Zadie Rhine, MD 07/23/22 [phone]

## 2022-07-23 NOTE — ED Triage Notes (Signed)
Pt presents to [facility] voluntarily, unaccompanied at this time due to suicidal ideations with no plan. Pt was discharged from [facility] on 07/22/22 and then sent to [facility] and was not accepted there because of current medical issues. Pt states hx of MDD and PTSD and prescribed Abilify and Lexapro. Pt denies HI, AVH and alcohol/substance use.

## 2022-07-23 NOTE — ED Provider Notes (Signed)
Behavioral Health Urgent Care Medical Screening Exam  Patient Name: Kristina Owens MRN: [id number] Date of Evaluation: 07/23/22 Chief Complaint:  "I don't have anywhere to go". Diagnosis:  Final diagnoses:  Homelessness  Malingering    History of Present illness: Kristina Owens is a 56 y.o. female. with a documented psychiatric history of polysubstance abuse, cocaine use disorder, alcohol use disorder, methamphetamine abuse, GAD, MDD, and PTSD presented voluntarily as a walk-in to [facility] with complaints of chronic passive SI due to homelessness.  Patient was seen face-to-face by this provider and chart reviewed.  Patient has a history of frequent presentations to the emergency departments, behavioral health Hospital, and urgent care for psychiatric care and detox treatments, but is unable to follow through.  Recently, patient presented to [facility] 07/17/2022 requesting detox from alcohol.  Patient was admitted to the [facility], treated/detoxed, and discharged 07/22/2022 with recommendation to follow up at [facility].  Tonight, patient reports she is open to utilizing ongoing resources for treatment options, which have been provided.  Pt reports she went over to [facility] recovery center yesterday 07/22/22 and was not admitted after they told her she needed to be on blood thinners for at least 7 days due to her history of blood clots, embolectomy of the left leg, and PE before she can be admitted. Patient reports she then presented to [facility] same day, and was evaluated, and presented at discharge with a 30-day starter pack prescription for her blood thinners which she has not refilled, and is stressed about this process due to her homelessness.  Patient currently denies SI, denies HI, denies AVH.  Patient reported chronic passive SI initially during triage, but specifically denies any plan or intent, stating it was due to her frustration and "emotional overload" because she is  homeless.  Patient denies substance use since she was discharged from the [facility] yesterday and reports she has all her prescription medications from her discharge.  During this evaluation, patient was very worried about where to stay for the next 7 days until she could be accepted at [facility].  Patient agreeable to discharge if she could get transportation vouchers to a [place] area homeless/shelter.   On evaluation, patient is alert, oriented x 4, and cooperative. Speech is clear and coherent. Pt appears well groomed. Eye contact is good. Mood is euthymic, affect is congruent with mood. Thought process is goal directed and thought content is WDL. Pt denies SI/HI/AVH. There is no indication that the patient is responding to internal stimuli. No delusions elicited during this assessment.   Support, encouragement, and reassurance provided about ongoing stressors.  Patient provided with opportunity for questions.    Flowsheet Row ED from 07/23/2022 in [facility] ED from 07/22/2022 in [facility] EMERGENCY DEPARTMENT ED from 07/17/2022 in [facility]  C-SSRS RISK CATEGORY Low Risk No Risk No Risk       Psychiatric Specialty Exam  Presentation  General Appearance:Appropriate for Environment  Eye Contact:Good  Speech:Clear and Coherent  Speech Volume:Normal  Handedness:Right   Mood and Affect  Mood: Euthymic  Affect: Congruent   Thought Process  Thought Processes: Goal Directed  Descriptions of Associations:Intact  Orientation:Full (Time, Place and Person)  Thought Content:WDL  Diagnosis of Schizophrenia or Schizoaffective disorder in past: No  Duration of Psychotic Symptoms: Greater than six months  Hallucinations:None "They tell me what to do, where to go, things like that." Dead people I know like my mother. denies  Ideas  of Reference:None  Suicidal Thoughts:No -- (n/a) Without  Intent  Homicidal Thoughts:No   Sensorium  Memory: Immediate Fair  Judgment: Fair  Insight: Present   Executive Functions  Concentration: Good  Attention Span: Good  Recall: Good  Fund of Knowledge: Good  Language: Good   Psychomotor Activity  Psychomotor Activity: Normal   Assets  Assets: Communication Skills; Desire for Improvement; Resilience   Sleep  Sleep: Fair  Number of hours:  2   No data recorded  Physical Exam: Physical Exam Constitutional:      General: She is not in acute distress.    Appearance: She is not diaphoretic.  HENT:     Head: Normocephalic.     Right Ear: External ear normal.     Left Ear: External ear normal.     Nose: No congestion.  Eyes:     General:        Right eye: No discharge.        Left eye: No discharge.  Cardiovascular:     Rate and Rhythm: Normal rate.  Pulmonary:     Effort: Pulmonary effort is normal. No respiratory distress.  Chest:     Chest wall: No tenderness.  Neurological:     Mental Status: She is alert and oriented to person, place, and time.  Psychiatric:        Attention and Perception: Attention and perception normal.        Mood and Affect: Mood and affect normal.        Speech: Speech normal.        Behavior: Behavior is cooperative.        Thought Content: Thought content is not paranoid or delusional. Thought content does not include homicidal or suicidal ideation. Thought content does not include homicidal or suicidal plan.        Cognition and Memory: Cognition normal.    Review of Systems  Constitutional:  Negative for chills, diaphoresis and fever.  HENT:  Negative for congestion.   Eyes:  Negative for discharge.  Respiratory:  Negative for cough, shortness of breath and wheezing.   Cardiovascular:  Negative for chest pain and palpitations.  Gastrointestinal:  Negative for diarrhea, nausea and vomiting.  Neurological:  Negative for dizziness, seizures, loss of  consciousness, weakness and headaches.  Psychiatric/Behavioral:  Negative for depression, hallucinations, substance abuse and suicidal ideas. The patient is not nervous/anxious and does not have insomnia.    Blood pressure 96/79, pulse 76, temperature 98 F (36.7 C), temperature source Oral, resp. rate 18, SpO2 100 %. There is no height or weight on file to calculate BMI.  Musculoskeletal: Strength & Muscle Tone: within normal limits Gait & Station: normal Patient leans: N/A   [facility] MSE Discharge Disposition for Follow up and Recommendations: Based on my evaluation the patient does not appear to have an emergency medical condition and can be discharged with resources and follow up care in outpatient services for Substance Abuse Intensive Outpatient Program  Recommend discharge follow-up with substance abuse intensive outpatient program. Patient provided with resources for area homeless shelters and transportation.  Patient encouraged to follow-up with treatment recommendations.  Patient does not meet inpatient psychiatric admission criteria or IVC criteria.  There is no evidence of imminent risk of harm to self or others.  Patient is able to contract for safety at discharge.  Patient is able to engage in safety planning, including plan to return to [facility] or contact emergency services if she feels unable to maintain her  own safety or the safety of others.  There is no evidence of active psychosis, patient is motivated for treatment, future planning, and is able to vocalize needs and wants for help anytime.  Discussed methods to reduce the risk of self-injury or suicide attempts: Frequent conversations regarding unsafe thoughts. Remove all significant sharps. Remove all firearms. Remove all medications, including over-the-counter meds. Consider lockbox for medications and having a responsible person dispense medications until patient has strengthened coping skills. Room checks for sharps or other  harmful objects. Secure all chemical substances that can be ingested or inhaled.   Please refrain from using alcohol or illicit substances, as they can affect your mood and can cause depression, anxiety or other concerning symptoms.  Alcohol can increase the chance that a person will make reckless decisions, like attempting suicide, and can increase the lethality of a drug overdose.    Discussed crisis plan, calling 911, or going to the ED if condition changes or worsens.  Patient verbalized her understanding.  Patient discharged and condition at discharge is stable.  Mancel Bale, NP 07/23/2022, 4:56 AM

## 2022-08-20 ENCOUNTER — Encounter (HOSPITAL_COMMUNITY): Payer: Self-pay | Admitting: Registered Nurse

## 2022-08-20 ENCOUNTER — Other Ambulatory Visit (HOSPITAL_COMMUNITY)
Admission: EM | Admit: 2022-08-20 | Discharge: 2022-08-28 | Disposition: A | Discharge status: Other Acute Inpatient Hospital | Payer: No Payment, Other | Attending: Psychiatry | Admitting: Psychiatry

## 2022-08-20 DIAGNOSIS — Z5900 Homelessness unspecified: Secondary | ICD-10-CM | POA: Diagnosis not present

## 2022-08-20 DIAGNOSIS — F1020 Alcohol dependence, uncomplicated: Secondary | ICD-10-CM | POA: Diagnosis present

## 2022-08-20 DIAGNOSIS — F1094 Alcohol use, unspecified with alcohol-induced mood disorder: Secondary | ICD-10-CM | POA: Diagnosis present

## 2022-08-20 DIAGNOSIS — F1910 Other psychoactive substance abuse, uncomplicated: Secondary | ICD-10-CM | POA: Diagnosis not present

## 2022-08-20 DIAGNOSIS — Z1152 Encounter for screening for COVID-19: Secondary | ICD-10-CM | POA: Insufficient documentation

## 2022-08-20 LAB — URINALYSIS, ROUTINE W REFLEX MICROSCOPIC
Bilirubin Urine: NEGATIVE
Glucose, UA: NEGATIVE mg/dL
Hgb urine dipstick: NEGATIVE
Ketones, ur: NEGATIVE mg/dL
Leukocytes,Ua: NEGATIVE
Nitrite: NEGATIVE
Protein, ur: NEGATIVE mg/dL
Specific Gravity, Urine: 1.02 (ref 1.005–1.030)
pH: 5 (ref 5.0–8.0)

## 2022-08-20 LAB — MAGNESIUM: Magnesium: 1.9 mg/dL (ref 1.7–2.4)

## 2022-08-20 LAB — CBC WITH DIFFERENTIAL/PLATELET
Abs Immature Granulocytes: 0.04 10*3/uL (ref 0.00–0.07)
Basophils Absolute: 0.1 10*3/uL (ref 0.0–0.1)
Basophils Relative: 1 %
Eosinophils Absolute: 0.1 10*3/uL (ref 0.0–0.5)
Eosinophils Relative: 2 %
HCT: 44.4 % (ref 36.0–46.0)
Hemoglobin: 15.2 g/dL — ABNORMAL HIGH (ref 12.0–15.0)
Immature Granulocytes: 1 %
Lymphocytes Relative: 34 %
Lymphs Abs: 2 10*3/uL (ref 0.7–4.0)
MCH: 31.1 pg (ref 26.0–34.0)
MCHC: 34.2 g/dL (ref 30.0–36.0)
MCV: 90.8 fL (ref 80.0–100.0)
Monocytes Absolute: 0.5 10*3/uL (ref 0.1–1.0)
Monocytes Relative: 8 %
Neutro Abs: 3.2 10*3/uL (ref 1.7–7.7)
Neutrophils Relative %: 54 %
Platelets: 294 10*3/uL (ref 150–400)
RBC: 4.89 MIL/uL (ref 3.87–5.11)
RDW: 13.5 % (ref 11.5–15.5)
WBC: 5.9 10*3/uL (ref 4.0–10.5)
nRBC: 0 % (ref 0.0–0.2)

## 2022-08-20 LAB — HEMOGLOBIN A1C
Hgb A1c MFr Bld: 5.4 % (ref 4.8–5.6)
Mean Plasma Glucose: 108.28 mg/dL

## 2022-08-20 LAB — LIPID PANEL
Cholesterol: 180 mg/dL (ref 0–200)
HDL: 77 mg/dL (ref >40)
LDL Cholesterol: 67 mg/dL (ref 0–99)
Total CHOL/HDL Ratio: 2.3 RATIO
Triglycerides: 181 mg/dL — ABNORMAL HIGH (ref <150)
VLDL: 36 mg/dL (ref 0–40)

## 2022-08-20 LAB — COMPREHENSIVE METABOLIC PANEL
ALT: 15 U/L (ref 0–44)
AST: 16 U/L (ref 15–41)
Albumin: 3.9 g/dL (ref 3.5–5.0)
Alkaline Phosphatase: 91 U/L (ref 38–126)
Anion gap: 11 (ref 5–15)
BUN: 5 mg/dL — ABNORMAL LOW (ref 6–20)
CO2: 24 mmol/L (ref 22–32)
Calcium: 9.3 mg/dL (ref 8.9–10.3)
Chloride: 105 mmol/L (ref 98–111)
Creatinine, Ser: 0.6 mg/dL (ref 0.44–1.00)
GFR, Estimated: >60 mL/min (ref >60)
Glucose, Bld: 84 mg/dL (ref 70–99)
Potassium: 3.9 mmol/L (ref 3.5–5.1)
Sodium: 140 mmol/L (ref 135–145)
Total Bilirubin: 0.2 mg/dL — ABNORMAL LOW (ref 0.3–1.2)
Total Protein: 6.8 g/dL (ref 6.5–8.1)

## 2022-08-20 LAB — POCT URINE DRUG SCREEN - MANUAL ENTRY (I-SCREEN)
POC Amphetamine UR: NOT DETECTED
POC Buprenorphine (BUP): NOT DETECTED
POC Cocaine UR: NOT DETECTED
POC Marijuana UR: NOT DETECTED
POC Methadone UR: NOT DETECTED
POC Methamphetamine UR: POSITIVE — AB
POC Morphine: NOT DETECTED
POC Oxazepam (BZO): NOT DETECTED
POC Oxycodone UR: NOT DETECTED
POC Secobarbital (BAR): NOT DETECTED

## 2022-08-20 LAB — POC URINE PREG, ED: Preg Test, Ur: NEGATIVE

## 2022-08-20 LAB — ETHANOL: Alcohol, Ethyl (B): <10 mg/dL (ref <10)

## 2022-08-20 LAB — POCT PREGNANCY, URINE: Preg Test, Ur: NEGATIVE

## 2022-08-20 LAB — POC SARS CORONAVIRUS 2 AG: SARSCOV2ONAVIRUS 2 AG: NEGATIVE

## 2022-08-20 LAB — RESP PANEL BY RT-PCR (RSV, FLU A&B, COVID)  RVPGX2
Influenza A by PCR: NEGATIVE
Influenza B by PCR: NEGATIVE
Resp Syncytial Virus by PCR: NEGATIVE
SARS Coronavirus 2 by RT PCR: NEGATIVE

## 2022-08-20 LAB — HIV ANTIBODY (ROUTINE TESTING W REFLEX): HIV Screen 4th Generation wRfx: NONREACTIVE

## 2022-08-20 MED ORDER — CHLORDIAZEPOXIDE HCL 25 MG PO CAPS
25.0000 mg | ORAL_CAPSULE | Freq: Four times a day (QID) | ORAL | Status: AC
  Administered 2022-08-20 – 2022-08-21 (×6): 25 mg via ORAL
  Filled 2022-08-20 (×6): qty 1

## 2022-08-20 MED ORDER — CHLORDIAZEPOXIDE HCL 25 MG PO CAPS
25.0000 mg | ORAL_CAPSULE | Freq: Every day | ORAL | Status: AC
  Administered 2022-08-24: 25 mg via ORAL
  Filled 2022-08-20: qty 1

## 2022-08-20 MED ORDER — RIVAROXABAN 20 MG PO TABS
20.0000 mg | ORAL_TABLET | Freq: Every day | ORAL | Status: DC
  Administered 2022-08-22 – 2022-08-27 (×5): 20 mg via ORAL
  Filled 2022-08-20 (×7): qty 1
  Filled 2022-08-20: qty 14

## 2022-08-20 MED ORDER — TRAZODONE HCL 50 MG PO TABS
50.0000 mg | ORAL_TABLET | Freq: Every evening | ORAL | Status: DC | PRN
  Administered 2022-08-20 – 2022-08-22 (×3): 50 mg via ORAL
  Filled 2022-08-20 (×2): qty 1
  Filled 2022-08-20: qty 14
  Filled 2022-08-20 (×4): qty 1

## 2022-08-20 MED ORDER — ACETAMINOPHEN 325 MG PO TABS
650.0000 mg | ORAL_TABLET | Freq: Four times a day (QID) | ORAL | Status: DC | PRN
  Administered 2022-08-21 – 2022-08-22 (×3): 650 mg via ORAL
  Filled 2022-08-20 (×2): qty 2

## 2022-08-20 MED ORDER — CHLORDIAZEPOXIDE HCL 25 MG PO CAPS
25.0000 mg | ORAL_CAPSULE | ORAL | Status: AC
  Administered 2022-08-23 (×2): 25 mg via ORAL
  Filled 2022-08-20 (×2): qty 1

## 2022-08-20 MED ORDER — ONDANSETRON 4 MG PO TBDP
4.0000 mg | ORAL_TABLET | Freq: Four times a day (QID) | ORAL | Status: AC | PRN
  Administered 2022-08-21: 4 mg via ORAL
  Filled 2022-08-20: qty 1

## 2022-08-20 MED ORDER — LOPERAMIDE HCL 2 MG PO CAPS: 2.0000 mg | ORAL_CAPSULE | ORAL | Status: DC | PRN

## 2022-08-20 MED ORDER — ARIPIPRAZOLE 10 MG PO TABS
10.0000 mg | ORAL_TABLET | Freq: Every day | ORAL | Status: DC
  Administered 2022-08-21 – 2022-08-27 (×7): 10 mg via ORAL
  Filled 2022-08-20 (×2): qty 1
  Filled 2022-08-20: qty 14
  Filled 2022-08-20 (×5): qty 1

## 2022-08-20 MED ORDER — ADULT MULTIVITAMIN W/MINERALS CH
1.0000 | ORAL_TABLET | Freq: Every day | ORAL | Status: DC
  Administered 2022-08-20 – 2022-08-27 (×8): 1 via ORAL
  Filled 2022-08-20 (×8): qty 1

## 2022-08-20 MED ORDER — ALUM & MAG HYDROXIDE-SIMETH 200-200-20 MG/5ML PO SUSP: 30.0000 mL | ORAL | Status: DC | PRN

## 2022-08-20 MED ORDER — CHLORDIAZEPOXIDE HCL 25 MG PO CAPS: 25.0000 mg | ORAL_CAPSULE | Freq: Four times a day (QID) | ORAL | Status: AC | PRN

## 2022-08-20 MED ORDER — HYDROXYZINE HCL 25 MG PO TABS
25.0000 mg | ORAL_TABLET | Freq: Four times a day (QID) | ORAL | Status: AC | PRN
  Administered 2022-08-20 – 2022-08-21 (×2): 25 mg via ORAL
  Filled 2022-08-20 (×2): qty 1

## 2022-08-20 MED ORDER — ESCITALOPRAM OXALATE 10 MG PO TABS
10.0000 mg | ORAL_TABLET | Freq: Every day | ORAL | Status: DC
  Administered 2022-08-21 – 2022-08-27 (×7): 10 mg via ORAL
  Filled 2022-08-20: qty 14
  Filled 2022-08-20 (×7): qty 1

## 2022-08-20 MED ORDER — THIAMINE HCL 100 MG/ML IJ SOLN
100.0000 mg | Freq: Once | INTRAMUSCULAR | Status: DC
  Filled 2022-08-20: qty 2

## 2022-08-20 MED ORDER — MAGNESIUM HYDROXIDE 400 MG/5ML PO SUSP: 30.0000 mL | Freq: Every day | ORAL | Status: DC | PRN

## 2022-08-20 MED ORDER — NICOTINE 21 MG/24HR TD PT24
21.0000 mg | MEDICATED_PATCH | Freq: Every day | TRANSDERMAL | Status: DC
  Administered 2022-08-21 – 2022-08-27 (×7): 21 mg via TRANSDERMAL
  Filled 2022-08-20 (×7): qty 1

## 2022-08-20 MED ORDER — CHLORDIAZEPOXIDE HCL 25 MG PO CAPS
25.0000 mg | ORAL_CAPSULE | Freq: Three times a day (TID) | ORAL | Status: AC
  Administered 2022-08-22 (×3): 25 mg via ORAL
  Filled 2022-08-20 (×3): qty 1

## 2022-08-20 NOTE — ED Triage Notes (Signed)
Pt presents to [facility] voluntarily seeking alcohol detox. Pt reports alcohol use daily about 1 pint a day. Pt reports last use of alcohol was about 1 hour ago, 1 pint and 1 shot. Pt reports SI with a plan of overdosing on drugs or cutting her wrists. Pt states "I don't wanna live". Pt reports being diagnosed with PTSD,OCD,ADHD, anxiety and depression, but she is not compliant with medication. Pt states she is currently homeless.Pt denies HI and AVH at this time.

## 2022-08-20 NOTE — Discharge Instructions (Addendum)
Kristina Owens, [place]: 30-day program: (uninsured and Medicaid such as [place], [place], [place], partners): Address: [place], [place] [address]  [facility] [address] [place], [place], [address] [phone] phone [phone] fax NOTE: Does have Substance Abuse-Intensive Outpatient Program [facility]) as well as transitional housing if eligible.  [facility] [place], [place] [address] [phone] Female and Female facility; [phone]; Residential Program; no additional fees; does not matter about insurance or uninsured; Will need to complete a phone screening; be able to work 40 hours a week; no prior sex offenders; no car for the first year of the program; 4 church services a week; receiving any income: may have to put 30% towards stay;  [facility]: [facility] Transitional Living [phone]  [place], [place] [phone]  [facility] in [place], [place]: [place] Desk Staff: Michel Bickers [phone]; They have a Men's Regenerations Program; The programs are 6-16months. There is an initial $300 fee however, they are willing to work with patients regarding that.   [facility] [address], [place], [place]  [address] [phone] Population served: Female veterans 18+ with substance abuse issues Eligibility: By referral only  [facility] [address], [place], [place] [address] [phone] Population served: Adult men & women (36 years old and older, able to perform activities for daily living) Documents required: Valid ID & [place] [address] [place], [place]  [address] [phone] Population served: Families with children  [facility] [address] [place], [place]  [address] [phone] Population served: Single women 18+ without dependents Documents required:  [place] Card  [facility] [address] [place], [place]  [phone] Population served: Female veterans 18+ with substance abuse/mental health issues Eligibility: By referral only  [facility] [address], [place], [place] [address] [phone] Population served: Males 18+ Documents required: Valid ID & Social Security Card  [facility] at [employer] [facility] [employer] [place]. [address] [place] [address] [phone] or [phone] Population served: Pregnant women with or without children Documents required: Valid ID & Social Security [employer] [place], [place], [place] [address] [phone] Population Served: Families with children, adult women, and adult men.  [facility] [address], [place],  [address] [phone] Population served: Men 18+, preference for disabled and/or veterans Eligibility: By referral only  [facility]) - [facility] [place], [place],  [address] [phone] or [phone] Population served: Families with children.

## 2022-08-20 NOTE — ED Notes (Signed)
Patient refused her medications, saying she has not taken them for more than a month. Patient said she will start taking them tomorrow morning.

## 2022-08-20 NOTE — ED Provider Notes (Signed)
Facility Based Crisis Admission H&P  Date: 08/20/22 Patient Name: Kristina Owens MRN: [id number] Chief Complaint:  Chief Complaint  Patient presents with   Alcohol Problem   Suicidal      Diagnoses:  Final diagnoses:  Alcohol use disorder, severe, dependence ([place])  Alcohol-induced mood disorder with depressive symptoms ([place])  Polysubstance abuse ([place])  Homelessness    HPI: Kristina Owens 57 y.o., female patient admitted to [facility] unit after presenting voluntarily as walk in with complaints of alcohol abuse causing worsening depression, requesting detox and rehab services.    Patient seen face to face by this provider, consulted with Dr. Hampton Abbot; and chart reviewed on 08/20/22.  On evaluation Kristina Owens states "I need help with detox from alcohol." Patient states she drinks one pt. of alcohol daily and her last drink was one hour ago. Patient also reports she uses meth, and the last use of meth was two or three days ago. Patient denies a history of seizures or DT's with alcohol withdrawal. Patient endorses passive suicidal ideation with nose specific plan or intent. She does state "I have thought about asking somebody to shoot me up with heroin. but I don't want to die. I want to live. I'm depressed and I want to get off of alcohol. When I'm drunk, I think of stupid things." Patient also endorses some paranoia that she states is always there feeling paranoid about people watching her or wanting to hurt her. Patient denies history of prior suicide attack and self-harming behaviors. Patient also denies homicidal ideation and auditory/visual hallucinations. Patient reports she has no outpatient psychiatric services but has been prescribed second topic medication in the past period reports during her last hospitalization she was prescribed Abilify and Lexapro but unable to give the dosage. Report it has been a month since she has taken either. Patient reports  prior psychiatric hospitalization related to substance abuse and depression. Patient reports she is currently homeless in [place] and unemployed. Patient reports her sister is supportive, but she is unable to live with her. During evaluation:  Kristina Owens is dressed appropriately but disheveled.  She is alert/oriented x 4, calm, cooperative, attentive.  Her answers are relevant to assessment questions.  Her mood is anxious and depressed with congruent affect.  She is speaking in a clear tone at moderate volume, and normal pace, with good eye contact.  She denies active suicidal ideation, self-harm/homicidal ideation, psychosis, and paranoia.  Objectively:  there is no evidence of psychosis/mania or delusional thinking.  She is able to converse coherently, with goal directed thoughts, and no distractibility, or pre-occupation.  She has responded appropriately to questions and has remained calm and cooperative throughout assessment. Patient also reports that she has a court date coming up 08/28/22 for misdemeanor larceny "stealing alcohol"  PHQ 2-9:  [facility] ED from 08/20/2022 in [facility] ED from 07/17/2022 in [facility] ED from 05/08/2022 in [facility]  Thoughts that you would be better off dead, or of hurting yourself in some way -- Not at all Nearly every day  PHQ-9 Total Score 22 22 11        Flowsheet Row ED from 08/20/2022 in [facility] ED from 07/23/2022 in [facility] ED from 07/22/2022 in [facility] CATEGORY High Risk Low Risk No Risk        Total Time spent with  patient: 30 minutes  Musculoskeletal  Strength & Muscle Tone: within normal limits Gait & Station: normal Patient leans: N/A  Psychiatric Specialty Exam  Presentation General Appearance:  Appropriate for  Environment; Disheveled  Eye Contact: Good  Speech: Clear and Coherent; Normal Rate  Speech Volume: Normal  Handedness: Right   Mood and Affect  Mood: Anxious; Dysphoric  Affect: Congruent   Thought Process  Thought Processes: Coherent; Goal Directed  Descriptions of Associations:Intact  Orientation:Full (Time, Place and Person)  Thought Content:Logical  Diagnosis of Schizophrenia or Schizoaffective disorder in past: No   Hallucinations:Hallucinations: None  Ideas of Reference:Paranoia (Patient states that she is always paranoid that someone is watching her or out to get her)  Suicidal Thoughts:Suicidal Thoughts: Yes, Passive SI Passive Intent and/or Plan: Without Intent  Homicidal Thoughts:Homicidal Thoughts: No   Sensorium  Memory: Immediate Good; Recent Good  Judgment: Intact  Insight: Present   Executive Functions  Concentration: Good  Attention Span: Good  Recall: Good  Fund of Knowledge: Good  Language: Good   Psychomotor Activity  Psychomotor Activity: Psychomotor Activity: Normal   Assets  Assets: Communication Skills; Desire for Improvement   Sleep  Sleep: Sleep: Fair   Nutritional Assessment (For OBS and FBC admissions only) Has the patient had a weight loss or gain of 10 pounds or more in the last 3 months?: Yes Has the patient had a decrease in food intake/or appetite?: No Does the patient have dental problems?: No Does the patient have eating habits or behaviors that may be indicators of an eating disorder including binging or inducing vomiting?: No Has the patient recently lost weight without trying?: 1 Has the patient been eating poorly because of a decreased appetite?: 0 Malnutrition Screening Tool Score: 1 Nutritional Assessment Referrals: Other (comment) (patient reporting homelessness and no money is reason for weight loss)    Physical Exam ROS  Blood pressure (!) 139/97, pulse 100, temperature  97.6 F (36.4 C), temperature source Oral, resp. rate 18, SpO2 100 %. There is no height or weight on file to calculate BMI.  Past Psychiatric History: See below   Patient Active Problem List   Diagnosis Date Noted   Alcohol-induced mood disorder with depressive symptoms (HCC) [F10.94] 08/20/2022   Polysubstance abuse (HCC) [F19.10] 08/20/2022   Homelessness [Z59.00] 08/20/2022   Alcohol abuse with intoxication (HCC) [F10.129] 06/06/2022   Substance induced mood disorder (HCC) [F19.94] 05/08/2022   Constipation [K59.00] 03/12/2022   History of menopause [Z78.0] 03/11/2022   Major depressive disorder, recurrent episode, severe, with psychosis (HCC) [F33.3] 03/10/2022   Bipolar disorder (HCC) [F31.9] 03/10/2022   Failure in dosage [Y63.9] 03/10/2022   Insomnia [G47.00] 03/10/2022   Major depressive disorder, recurrent, severe with psychotic features (HCC) [F33.3] 03/08/2022   Adjustment disorder with mixed anxiety and depressed mood [F43.23] 01/18/2022   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] 06/13/2021   Abnormal INR [R79.1] 02/19/2021   Long term (current) use of anticoagulants [Z79.01] 02/19/2021   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] 01/08/2021   DVT femoral (deep venous thrombosis) with thrombophlebitis, left (HCC) [I82.412] 01/08/2021   Polysubstance (excluding opioids) dependence (HCC) [F19.20] 01/08/2021   Alcohol withdrawal syndrome without complication (HCC) [F10.930] 01/08/2021   Bipolar disorder, in full remission, most recent episode depressed (HCC) [F31.76] 01/18/2020   GAD (generalized anxiety disorder) [F41.1] 01/18/2020   Alcohol use disorder, severe, dependence (HCC) [F10.20] 01/18/2020   Cocaine use disorder, severe, in early remission (HCC) [F14.21] 01/18/2020   Severe alcohol use disorder (HCC) [F10.20] 11/18/2019  Nicotine dependence, unspecified, uncomplicated [id number]   Post-traumatic stress disorder, unspecified [F43.10] 10/03/2019    Suicidal ideation [R45.851]    Alcohol-induced mood disorder ([place]) [F10.94] 07/24/2019   Cocaine use disorder, severe, dependence ([place]) [F14.20] 07/24/2019   History of colon polyps [Z86.010] 12/09/2018   Substance abuse in remission [facility]) [F19.11] 12/09/2018   Hypokalemia [E87.6] 06/05/2017    Is the patient at risk to self? No  Has the patient been a risk to self in the past 6 months? No .    Has the patient been a risk to self within the distant past? No   Is the patient a risk to others? No   Has the patient been a risk to others in the past 6 months? No   Has the patient been a risk to others within the distant past? No   Past Medical History:  Past Medical History:  Diagnosis Date   Anxiety    Depression    DVT (deep vein thrombosis) in pregnancy    GERD (gastroesophageal reflux disease)    Ovarian cyst     Past Surgical History:  Procedure Laterality Date   APPENDECTOMY  age 54   IR PTA VENOUS EXCEPT DIALYSIS CIRCUIT  01/16/2021   IR RADIOLOGIST EVAL & MGMT  03/19/2021   IR THROMBECT VENO MECH MOD SED  01/16/2021   IR US GUIDE VASC ACCESS LEFT  01/16/2021   IR VENO/EXT/UNI LEFT  01/16/2021   IR VENOCAVAGRAM IVC  01/16/2021   TONSILLECTOMY Bilateral age 12    Family History:  Family History  Problem Relation Age of Onset   Asthma Mother    COPD Mother    Cancer Father        thyroid cancer    Social History:  Social History   Socioeconomic History   Marital status: Divorced    Spouse name: Not on file   Number of children: Not on file   Years of education: Not on file   Highest education level: Not on file  Occupational History   Not on file  Tobacco Use   Smoking status: Every Day    Packs/day: 0.50    Years: 35.00    Total pack years: 17.50    Types: Cigarettes   Smokeless tobacco: Never  Vaping Use   Vaping Use: Never used  Substance and Sexual Activity   Alcohol use: Yes   Drug use: Yes    Types: "Crack" cocaine, Cocaine, Methamphetamines     Comment: last used 4/31/22   Sexual activity: Not on file  Other Topics Concern   Not on file  Social History Narrative   Not on file   Social Determinants of Health   Financial Resource Strain: Not on file  Food Insecurity: Not on file  Transportation Needs: Not on file  Physical Activity: Not on file  Stress: Not on file  Social Connections: Not on file  Intimate Partner Violence: Not on file    SDOH:  SDOH Screenings   Alcohol Screen: High Risk (03/10/2022)  Depression (PHQ2-9): High Risk (08/20/2022)  Tobacco Use: High Risk (08/20/2022)    Last Labs:  Admission on 07/22/2022, Discharged on 07/23/2022  Component Date Value Ref Range Status   SARS Coronavirus 2 by RT PCR 07/22/2022 NEGATIVE  NEGATIVE Final   Comment: (NOTE) SARS-CoV-2 target nucleic acids are NOT DETECTED.  The SARS-CoV-2 RNA is generally detectable in upper respiratory specimens during the acute phase of infection. The lowest concentration of SARS-CoV-2 viral copies this  assay can detect is 138 copies/mL. A negative result does not preclude SARS-Cov-2 infection and should not be used as the sole basis for treatment or other patient management decisions. A negative result may occur with  improper specimen collection/handling, submission of specimen other than nasopharyngeal swab, presence of viral mutation(s) within the areas targeted by this assay, and inadequate number of viral copies(<138 copies/mL). A negative result must be combined with clinical observations, patient history, and epidemiological information. The expected result is Negative.  Fact Sheet for Patients:  EntrepreneurPulse.com.au  Fact Sheet for Healthcare Providers:  IncredibleEmployment.be  This test is no                          t yet approved or cleared by the [place] FDA and  has been authorized for detection and/or diagnosis of SARS-CoV-2 by FDA under an Emergency Use Authorization  (EUA). This EUA will remain  in effect (meaning this test can be used) for the duration of the COVID-19 declaration under Section 564(b)(1) of the Act, 21 U.S.C.section 360bbb-3(b)(1), unless the authorization is terminated  or revoked sooner.       Influenza A by PCR 07/22/2022 NEGATIVE  NEGATIVE Final   Influenza B by PCR 07/22/2022 NEGATIVE  NEGATIVE Final   Comment: (NOTE) The Xpert Xpress SARS-CoV-2/FLU/RSV plus assay is intended as an aid in the diagnosis of influenza from Nasopharyngeal swab specimens and should not be used as a sole basis for treatment. Nasal washings and aspirates are unacceptable for Xpert Xpress SARS-CoV-2/FLU/RSV testing.  Fact Sheet for Patients: EntrepreneurPulse.com.au  Fact Sheet for Healthcare Providers: IncredibleEmployment.be  This test is not yet approved or cleared by the [place] FDA and has been authorized for detection and/or diagnosis of SARS-CoV-2 by FDA under an Emergency Use Authorization (EUA). This EUA will remain in effect (meaning this test can be used) for the duration of the COVID-19 declaration under Section 564(b)(1) of the Act, 21 U.S.C. section 360bbb-3(b)(1), unless the authorization is terminated or revoked.     Resp Syncytial Virus by PCR 07/22/2022 NEGATIVE  NEGATIVE Final   Comment: (NOTE) Fact Sheet for Patients: EntrepreneurPulse.com.au  Fact Sheet for Healthcare Providers: IncredibleEmployment.be  This test is not yet approved or cleared by the [place] FDA and has been authorized for detection and/or diagnosis of SARS-CoV-2 by FDA under an Emergency Use Authorization (EUA). This EUA will remain in effect (meaning this test can be used) for the duration of the COVID-19 declaration under Section 564(b)(1) of the Act, 21 U.S.C. section 360bbb-3(b)(1), unless the authorization is terminated or revoked.  Performed at [facility], [place] [address], [place], [place] [address]    Sodium 07/22/2022 136  135 - 145 mmol/L Final   Potassium 07/22/2022 4.5  3.5 - 5.1 mmol/L Final   Chloride 07/22/2022 106  98 - 111 mmol/L Final   CO2 07/22/2022 22  22 - 32 mmol/L Final   Glucose, Bld 07/22/2022 87  70 - 99 mg/dL Final   Glucose reference range applies only to samples taken after fasting for at least 8 hours.   BUN 07/22/2022 18  6 - 20 mg/dL Final   Creatinine, Ser 07/22/2022 0.61  0.44 - 1.00 mg/dL Final   Calcium 07/22/2022 9.3  8.9 - 10.3 mg/dL Final   Total Protein 07/22/2022 7.1  6.5 - 8.1 g/dL Final   Albumin 07/22/2022 4.0  3.5 - 5.0 g/dL Final   AST 07/22/2022 21  15 - 41 U/L Final   ALT 07/22/2022 18  0 - 44 U/L Final   Alkaline Phosphatase 07/22/2022 67  38 - 126 U/L Final   Total Bilirubin 07/22/2022 0.1 (L)  0.3 - 1.2 mg/dL Final   GFR, Estimated 07/22/2022 >60  >60 mL/min Final   Comment: (NOTE) Calculated using the CKD-EPI Creatinine Equation (2021)    Anion gap 07/22/2022 8  5 - 15 Final   Performed at [facility], [place] [address] [place], [place] [address]   WBC 07/22/2022 7.2  4.0 - 10.5 K/uL Final   RBC 07/22/2022 5.27 (H)  3.87 - 5.11 MIL/uL Final   Hemoglobin 07/22/2022 15.8 (H)  12.0 - 15.0 g/dL Final   HCT 07/22/2022 49.0 (H)  36.0 - 46.0 % Final   MCV 07/22/2022 93.0  80.0 - 100.0 fL Final   MCH 07/22/2022 30.0  26.0 - 34.0 pg Final   MCHC 07/22/2022 32.2  30.0 - 36.0 g/dL Final   RDW 07/22/2022 13.7  11.5 - 15.5 % Final   Platelets 07/22/2022 276  150 - 400 K/uL Final   nRBC 07/22/2022 0.0  0.0 - 0.2 % Final   Performed at [facility], [place] [address], [place], [place] [address]   I-stat hCG, quantitative 07/22/2022 <5.0  <5 mIU/mL Final   Comment 3 07/22/2022          Final   Comment:   GEST. AGE      CONC.  (mIU/mL)   <=1 WEEK        5 - 50     2 WEEKS       50 - 500     3 WEEKS       100 - 10,000     4 WEEKS     1,000 - 30,000        FEMALE AND NON-PREGNANT  FEMALE:     LESS THAN 5 mIU/mL   Admission on 07/17/2022, Discharged on 07/22/2022  Component Date Value Ref Range Status   SARS Coronavirus 2 by RT PCR 07/17/2022 NEGATIVE  NEGATIVE Final   Comment: (NOTE) SARS-CoV-2 target nucleic acids are NOT DETECTED.  The SARS-CoV-2 RNA is generally detectable in upper respiratory specimens during the acute phase of infection. The lowest concentration of SARS-CoV-2 viral copies this assay can detect is 138 copies/mL. A negative result does not preclude SARS-Cov-2 infection and should not be used as the sole basis for treatment or other patient management decisions. A negative result may occur with  improper specimen collection/handling, submission of specimen other than nasopharyngeal swab, presence of viral mutation(s) within the areas targeted by this assay, and inadequate number of viral copies(<138 copies/mL). A negative result must be combined with clinical observations, patient history, and epidemiological information. The expected result is Negative.  Fact Sheet for Patients:  EntrepreneurPulse.com.au  Fact Sheet for Healthcare Providers:  IncredibleEmployment.be  This test is no                          t yet approved or cleared by the [place] FDA and  has been authorized for detection and/or diagnosis of SARS-CoV-2 by FDA under an Emergency Use Authorization (EUA). This EUA will remain  in effect (meaning this test can be used) for the duration of the COVID-19 declaration under Section 564(b)(1) of the Act, 21 U.S.C.section 360bbb-3(b)(1), unless the authorization is terminated  or revoked sooner.  Influenza A by PCR 07/17/2022 NEGATIVE  NEGATIVE Final   Influenza B by PCR 07/17/2022 NEGATIVE  NEGATIVE Final   Comment: (NOTE) The Xpert Xpress SARS-CoV-2/FLU/RSV plus assay is intended as an aid in the diagnosis of influenza from Nasopharyngeal swab specimens and should not be used  as a sole basis for treatment. Nasal washings and aspirates are unacceptable for Xpert Xpress SARS-CoV-2/FLU/RSV testing.  Fact Sheet for Patients: BloggerCourse.com  Fact Sheet for Healthcare Providers: SeriousBroker.it  This test is not yet approved or cleared by the [place] FDA and has been authorized for detection and/or diagnosis of SARS-CoV-2 by FDA under an Emergency Use Authorization (EUA). This EUA will remain in effect (meaning this test can be used) for the duration of the COVID-19 declaration under Section 564(b)(1) of the Act, 21 U.S.C. section 360bbb-3(b)(1), unless the authorization is terminated or revoked.     Resp Syncytial Virus by PCR 07/17/2022 NEGATIVE  NEGATIVE Final   Comment: (NOTE) Fact Sheet for Patients: BloggerCourse.com  Fact Sheet for Healthcare Providers: SeriousBroker.it  This test is not yet approved or cleared by the [place] FDA and has been authorized for detection and/or diagnosis of SARS-CoV-2 by FDA under an Emergency Use Authorization (EUA). This EUA will remain in effect (meaning this test can be used) for the duration of the COVID-19 declaration under Section 564(b)(1) of the Act, 21 U.S.C. section 360bbb-3(b)(1), unless the authorization is terminated or revoked.  Performed at [facility], [address] [place], [place] [address]    WBC 07/17/2022 8.9  4.0 - 10.5 K/uL Final   RBC 07/17/2022 4.64  3.87 - 5.11 MIL/uL Final   Hemoglobin 07/17/2022 14.2  12.0 - 15.0 g/dL Final   HCT 61/60/7371 41.6  36.0 - 46.0 % Final   MCV 07/17/2022 89.7  80.0 - 100.0 fL Final   MCH 07/17/2022 30.6  26.0 - 34.0 pg Final   MCHC 07/17/2022 34.1  30.0 - 36.0 g/dL Final   RDW 01/28/9484 13.7  11.5 - 15.5 % Final   Platelets 07/17/2022 306  150 - 400 K/uL Final   nRBC 07/17/2022 0.0  0.0 - 0.2 % Final   Neutrophils Relative %  07/17/2022 59  % Final   Neutro Abs 07/17/2022 5.2  1.7 - 7.7 K/uL Final   Lymphocytes Relative 07/17/2022 35  % Final   Lymphs Abs 07/17/2022 3.1  0.7 - 4.0 K/uL Final   Monocytes Relative 07/17/2022 5  % Final   Monocytes Absolute 07/17/2022 0.5  0.1 - 1.0 K/uL Final   Eosinophils Relative 07/17/2022 1  % Final   Eosinophils Absolute 07/17/2022 0.1  0.0 - 0.5 K/uL Final   Basophils Relative 07/17/2022 0  % Final   Basophils Absolute 07/17/2022 0.0  0.0 - 0.1 K/uL Final   Immature Granulocytes 07/17/2022 0  % Final   Abs Immature Granulocytes 07/17/2022 0.04  0.00 - 0.07 K/uL Final   Performed at [facility], [address], [place], [place] [address]   Sodium 07/17/2022 139  135 - 145 mmol/L Final   Potassium 07/17/2022 3.7  3.5 - 5.1 mmol/L Final   Chloride 07/17/2022 104  98 - 111 mmol/L Final   CO2 07/17/2022 24  22 - 32 mmol/L Final   Glucose, Bld 07/17/2022 51 (L)  70 - 99 mg/dL Final   Glucose reference range applies only to samples taken after fasting for at least 8 hours.   BUN 07/17/2022 8  6 - 20 mg/dL Final  Creatinine, Ser 07/17/2022 0.69  0.44 - 1.00 mg/dL Final   Calcium 07/17/2022 8.8 (L)  8.9 - 10.3 mg/dL Final   Total Protein 07/17/2022 6.2 (L)  6.5 - 8.1 g/dL Final   Albumin 07/17/2022 3.6  3.5 - 5.0 g/dL Final   AST 07/17/2022 23  15 - 41 U/L Final   ALT 07/17/2022 19  0 - 44 U/L Final   Alkaline Phosphatase 07/17/2022 82  38 - 126 U/L Final   Total Bilirubin 07/17/2022 0.6  0.3 - 1.2 mg/dL Final   GFR, Estimated 07/17/2022 >60  >60 mL/min Final   Comment: (NOTE) Calculated using the CKD-EPI Creatinine Equation (2021)    Anion gap 07/17/2022 11  5 - 15 Final   Performed at [facility] [address] [place], [place] [address]   Alcohol, Ethyl (B) 07/17/2022 37 (H)  <10 mg/dL Final   Comment: (NOTE) Lowest detectable limit for serum alcohol is 10 mg/dL.  For medical purposes only. Performed at [facility], [place] [address]  [place], [place] [address]    Neisseria Gonorrhea 07/17/2022 Negative   Final   Chlamydia 07/17/2022 Positive (A)   Final   Comment 07/17/2022 Normal Reference [place] Chlamydia - Negative   Final   Comment 07/17/2022 Normal Reference Range Neisseria Gonorrhea - Negative   Final   POC Amphetamine UR 07/17/2022 None Detected  NONE DETECTED (Cut Off Level 1000 ng/mL) Final   POC Secobarbital (BAR) 07/17/2022 None Detected  NONE DETECTED (Cut Off Level 300 ng/mL) Final   POC Buprenorphine (BUP) 07/17/2022 None Detected  NONE DETECTED (Cut Off Level 10 ng/mL) Final   POC Oxazepam (BZO) 07/17/2022 Positive (A)  NONE DETECTED (Cut Off Level 300 ng/mL) Final   POC Cocaine UR 07/17/2022 None Detected  NONE DETECTED (Cut Off Level 300 ng/mL) Final   POC Methamphetamine UR 07/17/2022 None Detected  NONE DETECTED (Cut Off Level 1000 ng/mL) Final   POC Morphine 07/17/2022 None Detected  NONE DETECTED (Cut Off Level 300 ng/mL) Final   POC Methadone UR 07/17/2022 None Detected  NONE DETECTED (Cut Off Level 300 ng/mL) Final   POC Oxycodone UR 07/17/2022 None Detected  NONE DETECTED (Cut Off Level 100 ng/mL) Final   POC Marijuana UR 07/17/2022 None Detected  NONE DETECTED (Cut Off Level 50 ng/mL) Final   Preg Test, Ur 07/17/2022 Negative  Negative Final   RPR Ser Ql 07/17/2022 NON REACTIVE  NON REACTIVE Final   Performed at [facility], [place] [address] [place], [place] [address]   HIV Screen 4th Generation wRfx 07/17/2022 Non Reactive  Non Reactive Final   Performed at [facility], [place] [address], [place],  [address]   SARSCOV2ONAVIRUS 2 AG 07/17/2022 NEGATIVE  NEGATIVE Final   Comment: (NOTE) SARS-CoV-2 antigen NOT DETECTED.   Negative results are presumptive.  Negative results do not preclude SARS-CoV-2 infection and should not be used as the sole basis for treatment or other patient management decisions, including infection  control decisions, particularly in the presence of clinical  signs and  symptoms consistent with COVID-19, or in those who have been in contact with the virus.  Negative results must be combined with clinical observations, patient history, and epidemiological information. The expected result is Negative.  Fact Sheet for Patients: HandmadeRecipes.com.cy  Fact Sheet for Healthcare Providers: FuneralLife.at  This test is not yet approved or cleared by the [place] FDA and  has been authorized for detection and/or diagnosis of SARS-CoV-2  by FDA under an Emergency Use Authorization (EUA).  This EUA will remain in effect (meaning this test can be used) for the duration of  the COV                          [place]-19 declaration under Section 564(b)(1) of the Act, 21 U.S.C. section 360bbb-3(b)(1), unless the authorization is terminated or revoked sooner.     Preg Test, Ur 07/17/2022 NEGATIVE  NEGATIVE Final   Comment:        THE SENSITIVITY OF THIS METHODOLOGY IS >24 mIU/mL   Admission on 07/14/2022, Discharged on 07/14/2022  Component Date Value Ref Range Status   SARS Coronavirus 2 by RT PCR 07/14/2022 NEGATIVE  NEGATIVE Final   Comment: (NOTE) SARS-CoV-2 target nucleic acids are NOT DETECTED.  The SARS-CoV-2 RNA is generally detectable in upper respiratory specimens during the acute phase of infection. The lowest concentration of SARS-CoV-2 viral copies this assay can detect is 138 copies/mL. A negative result does not preclude SARS-Cov-2 infection and should not be used as the sole basis for treatment or other patient management decisions. A negative result may occur with  improper specimen collection/handling, submission of specimen other than nasopharyngeal swab, presence of viral mutation(s) within the areas targeted by this assay, and inadequate number of viral copies(<138 copies/mL). A negative result must be combined with clinical observations, patient history, and  epidemiological information. The expected result is Negative.  Fact Sheet for Patients:  EntrepreneurPulse.com.au  Fact Sheet for Healthcare Providers:  IncredibleEmployment.be  This test is no                          t yet approved or cleared by the [place] FDA and  has been authorized for detection and/or diagnosis of SARS-CoV-2 by FDA under an Emergency Use Authorization (EUA). This EUA will remain  in effect (meaning this test can be used) for the duration of the COVID-19 declaration under Section 564(b)(1) of the Act, 21 U.S.C.section 360bbb-3(b)(1), unless the authorization is terminated  or revoked sooner.       Influenza A by PCR 07/14/2022 NEGATIVE  NEGATIVE Final   Influenza B by PCR 07/14/2022 NEGATIVE  NEGATIVE Final   Comment: (NOTE) The Xpert Xpress SARS-CoV-2/FLU/RSV plus assay is intended as an aid in the diagnosis of influenza from Nasopharyngeal swab specimens and should not be used as a sole basis for treatment. Nasal washings and aspirates are unacceptable for Xpert Xpress SARS-CoV-2/FLU/RSV testing.  Fact Sheet for Patients: EntrepreneurPulse.com.au  Fact Sheet for Healthcare Providers: IncredibleEmployment.be  This test is not yet approved or cleared by the [place] FDA and has been authorized for detection and/or diagnosis of SARS-CoV-2 by FDA under an Emergency Use Authorization (EUA). This EUA will remain in effect (meaning this test can be used) for the duration of the COVID-19 declaration under Section 564(b)(1) of the Act, 21 U.S.C. section 360bbb-3(b)(1), unless the authorization is terminated or revoked.  Performed at [facility], [place] [address], [place], [place] [address]   Admission on 07/11/2022, Discharged on 07/11/2022  Component Date Value Ref Range Status   WBC 07/11/2022 9.7  4.0 - 10.5 K/uL Final   RBC 07/11/2022 5.26 (H)  3.87 -  5.11 MIL/uL Final   Hemoglobin 07/11/2022 15.9 (H)  12.0 - 15.0 g/dL Final   HCT 07/11/2022 48.3 (H)  36.0 - 46.0 % Final   MCV 07/11/2022 91.8  80.0 - 100.0 fL Final   MCH 07/11/2022 30.2  26.0 - 34.0 pg Final   MCHC 07/11/2022 32.9  30.0 - 36.0 g/dL Final   RDW 07/11/2022 13.5  11.5 - 15.5 % Final   Platelets 07/11/2022 282  150 - 400 K/uL Final   nRBC 07/11/2022 0.0  0.0 - 0.2 % Final   Neutrophils Relative % 07/11/2022 63  % Final   Neutro Abs 07/11/2022 6.1  1.7 - 7.7 K/uL Final   Lymphocytes Relative 07/11/2022 29  % Final   Lymphs Abs 07/11/2022 2.8  0.7 - 4.0 K/uL Final   Monocytes Relative 07/11/2022 7  % Final   Monocytes Absolute 07/11/2022 0.7  0.1 - 1.0 K/uL Final   Eosinophils Relative 07/11/2022 1  % Final   Eosinophils Absolute 07/11/2022 0.1  0.0 - 0.5 K/uL Final   Basophils Relative 07/11/2022 0  % Final   Basophils Absolute 07/11/2022 0.0  0.0 - 0.1 K/uL Final   Immature Granulocytes 07/11/2022 0  % Final   Abs Immature Granulocytes 07/11/2022 0.03  0.00 - 0.07 K/uL Final   Performed at [facility], [place] [address], [place], [place] [address]   Sodium 07/11/2022 139  135 - 145 mmol/L Final   Potassium 07/11/2022 3.8  3.5 - 5.1 mmol/L Final   Chloride 07/11/2022 105  98 - 111 mmol/L Final   CO2 07/11/2022 24  22 - 32 mmol/L Final   Glucose, Bld 07/11/2022 90  70 - 99 mg/dL Final   Glucose reference range applies only to samples taken after fasting for at least 8 hours.   BUN 07/11/2022 9  6 - 20 mg/dL Final   Creatinine, Ser 07/11/2022 0.57  0.44 - 1.00 mg/dL Final   Calcium 07/11/2022 8.5 (L)  8.9 - 10.3 mg/dL Final   Total Protein 07/11/2022 6.3 (L)  6.5 - 8.1 g/dL Final   Albumin 07/11/2022 3.6  3.5 - 5.0 g/dL Final   AST 07/11/2022 19  15 - 41 U/L Final   ALT 07/11/2022 16  0 - 44 U/L Final   Alkaline Phosphatase 07/11/2022 78  38 - 126 U/L Final   Total Bilirubin 07/11/2022 0.4  0.3 - 1.2 mg/dL Final   GFR, Estimated 07/11/2022 >60  >60 mL/min Final    Comment: (NOTE) Calculated using the CKD-EPI Creatinine Equation (2021)    Anion gap 07/11/2022 10  5 - 15 Final   Performed at [place] [address], [place], [place] [address]   Troponin I (High Sensitivity) 07/11/2022 <2  <18 ng/L Final   Comment: (NOTE) Elevated high sensitivity troponin I (hsTnI) values and significant  changes across serial measurements may suggest ACS but many other  chronic and acute conditions are known to elevate hsTnI results.  Refer to the "Links" section for chest pain algorithms and additional  guidance. Performed at [facility], [place] [address], [place], [place] [address]    Lipase 07/11/2022 35  11 - 51 U/L Final   Performed at [place] [address] [place], [place] [address]   D-Dimer, Quant 07/11/2022 <0.27  0.00 - 0.50 ug/mL-FEU Final   Comment: (NOTE) At the manufacturer cut-off value of 0.5 g/mL FEU, this assay has a negative predictive value of 95-100%.This assay is intended for use in conjunction with a clinical pretest probability (PTP) assessment model to exclude pulmonary embolism (PE) and deep venous thrombosis (DVT) in outpatients suspected of PE or DVT. Results should be  correlated with clinical presentation. Performed at [facility], [address], [place], [place] [address]    Color, Urine 07/11/2022 YELLOW  YELLOW Final   APPearance 07/11/2022 HAZY (A)  CLEAR Final   Specific Gravity, Urine 07/11/2022 1.024  1.005 - 1.030 Final   pH 07/11/2022 6.0  5.0 - 8.0 Final   Glucose, UA 07/11/2022 NEGATIVE  NEGATIVE mg/dL Final   Hgb urine dipstick 07/11/2022 NEGATIVE  NEGATIVE Final   Bilirubin Urine 07/11/2022 NEGATIVE  NEGATIVE Final   Ketones, ur 07/11/2022 NEGATIVE  NEGATIVE mg/dL Final   Protein, ur 60/45/4098 NEGATIVE  NEGATIVE mg/dL Final   Nitrite 11/91/4782 NEGATIVE  NEGATIVE Final   Leukocytes,Ua 07/11/2022 NEGATIVE  NEGATIVE Final   Performed at [facility], [address], [place], [place] [address]   SARS Coronavirus 2 by RT PCR 07/11/2022 NEGATIVE  NEGATIVE Final   Comment: (NOTE) SARS-CoV-2 target nucleic acids are NOT DETECTED.  The SARS-CoV-2 RNA is generally detectable in upper respiratory specimens during the acute phase of infection. The lowest concentration of SARS-CoV-2 viral copies this assay can detect is 138 copies/mL. A negative result does not preclude SARS-Cov-2 infection and should not be used as the sole basis for treatment or other patient management decisions. A negative result may occur with  improper specimen collection/handling, submission of specimen other than nasopharyngeal swab, presence of viral mutation(s) within the areas targeted by this assay, and inadequate number of viral copies(<138 copies/mL). A negative result must be combined with clinical observations, patient history, and epidemiological information. The expected result is Negative.  Fact Sheet for Patients:  BloggerCourse.com  Fact Sheet for Healthcare Providers:  SeriousBroker.it  This test is no                          t yet approved or cleared by the [place] FDA and  has been authorized for detection and/or diagnosis of SARS-CoV-2 by FDA under an Emergency Use Authorization (EUA). This EUA will remain  in effect (meaning this test can be used) for the duration of the COVID-19 declaration under Section 564(b)(1) of the Act, 21 U.S.C.section 360bbb-3(b)(1), unless the authorization is terminated  or revoked sooner.       Influenza A by PCR 07/11/2022 NEGATIVE  NEGATIVE Final   Influenza B by PCR 07/11/2022 NEGATIVE  NEGATIVE Final   Comment: (NOTE) The Xpert Xpress SARS-CoV-2/FLU/RSV plus assay is intended as an aid in the diagnosis of influenza from Nasopharyngeal swab specimens and should not be used as a sole basis for treatment. Nasal washings and aspirates are unacceptable for Xpert Xpress  SARS-CoV-2/FLU/RSV testing.  Fact Sheet for Patients: BloggerCourse.com  Fact Sheet for Healthcare Providers: SeriousBroker.it  This test is not yet approved or cleared by the [place] FDA and has been authorized for detection and/or diagnosis of SARS-CoV-2 by FDA under an Emergency Use Authorization (EUA). This EUA will remain in effect (meaning this test can be used) for the duration of the COVID-19 declaration under Section 564(b)(1) of the Act, 21 U.S.C. section 360bbb-3(b)(1), unless the authorization is terminated or revoked.  Performed at [facility], [address], [place], [place] [address]    I-stat hCG, quantitative 07/11/2022 <5.0  <5 mIU/mL Final   Comment 3 07/11/2022          Final   Comment:   GEST. AGE      CONC.  (mIU/mL)   <=1 WEEK  5 - 50     2 WEEKS       50 - 500     3 WEEKS       100 - 10,000     4 WEEKS     1,000 - 30,000        FEMALE AND NON-PREGNANT FEMALE:     LESS THAN 5 mIU/mL    Troponin I (High Sensitivity) 07/11/2022 2  <18 ng/L Final   Comment: (NOTE) Elevated high sensitivity troponin I (hsTnI) values and significant  changes across serial measurements may suggest ACS but many other  chronic and acute conditions are known to elevate hsTnI results.  Refer to the "Links" section for chest pain algorithms and additional  guidance. Performed at [facility], [address] [place], [place] [address]   Admission on 07/02/2022, Discharged on 07/02/2022  Component Date Value Ref Range Status   Color, Urine 07/02/2022 YELLOW  YELLOW Final   APPearance 07/02/2022 HAZY (A)  CLEAR Final   Specific Gravity, Urine 07/02/2022 1.014  1.005 - 1.030 Final   pH 07/02/2022 5.0  5.0 - 8.0 Final   Glucose, UA 07/02/2022 NEGATIVE  NEGATIVE mg/dL Final   Hgb urine dipstick 07/02/2022 SMALL (A)  NEGATIVE Final   Bilirubin Urine 07/02/2022 NEGATIVE  NEGATIVE Final   Ketones, ur  07/02/2022 NEGATIVE  NEGATIVE mg/dL Final   Protein, ur 19/14/782911/29/2023 NEGATIVE  NEGATIVE mg/dL Final   Nitrite 56/21/308611/29/2023 POSITIVE (A)  NEGATIVE Final   Leukocytes,Ua 07/02/2022 LARGE (A)  NEGATIVE Final   RBC / HPF 07/02/2022 6-10  0 - 5 RBC/hpf Final   WBC, UA 07/02/2022 >50 (H)  0 - 5 WBC/hpf Final   Bacteria, UA 07/02/2022 FEW (A)  NONE SEEN Final   Squamous Epithelial / HPF 07/02/2022 6-10  0 - 5 Final   Mucus 07/02/2022 PRESENT   Final   Non Squamous Epithelial 07/02/2022 0-5 (A)  NONE SEEN Final   Performed at [facility], [address] [place], [place] [address]   WBC 07/02/2022 9.9  4.0 - 10.5 K/uL Final   RBC 07/02/2022 5.18 (H)  3.87 - 5.11 MIL/uL Final   Hemoglobin 07/02/2022 15.8 (H)  12.0 - 15.0 g/dL Final   HCT 96/29/528411/29/2023 48.5 (H)  36.0 - 46.0 % Final   MCV 07/02/2022 93.6  80.0 - 100.0 fL Final   MCH 07/02/2022 30.5  26.0 - 34.0 pg Final   MCHC 07/02/2022 32.6  30.0 - 36.0 g/dL Final   RDW 13/24/401011/29/2023 13.5  11.5 - 15.5 % Final   Platelets 07/02/2022 249  150 - 400 K/uL Final   nRBC 07/02/2022 0.0  0.0 - 0.2 % Final   Performed at [facility], [address] [place], [place] [address]   Sodium 07/02/2022 138  135 - 145 mmol/L Final   Potassium 07/02/2022 3.9  3.5 - 5.1 mmol/L Final   Chloride 07/02/2022 105  98 - 111 mmol/L Final   CO2 07/02/2022 26  22 - 32 mmol/L Final   Glucose, Bld 07/02/2022 114 (H)  70 - 99 mg/dL Final   Glucose reference range applies only to samples taken after fasting for at least 8 hours.   BUN 07/02/2022 9  6 - 20 mg/dL Final   Creatinine, Ser 07/02/2022 0.64  0.44 - 1.00 mg/dL Final   Calcium 66/44/034711/29/2023 8.9  8.9 - 10.3 mg/dL Final   GFR, Estimated 07/02/2022 >60  >60 mL/min Final   Comment: (NOTE) Calculated  using the CKD-EPI Creatinine Equation (2021)    Anion gap 07/02/2022 7  5 - 15 Final   Performed at [facility], [place] [address], [place], [place] [address]   Lipase  07/02/2022 35  11 - 51 U/L Final   Performed at [facility], [place] [address] [place], [place] [address]   Total Protein 07/02/2022 6.9  6.5 - 8.1 g/dL Final   Albumin 07/02/2022 3.8  3.5 - 5.0 g/dL Final   AST 07/02/2022 17  15 - 41 U/L Final   ALT 07/02/2022 16  0 - 44 U/L Final   Alkaline Phosphatase 07/02/2022 91  38 - 126 U/L Final   Total Bilirubin 07/02/2022 0.9  0.3 - 1.2 mg/dL Final   Bilirubin, Direct 07/02/2022 0.1  0.0 - 0.2 mg/dL Final   Indirect Bilirubin 07/02/2022 0.8  0.3 - 0.9 mg/dL Final   Performed at [facility], [place] [address], [place], [place] [address]  Admission on 06/06/2022, Discharged on 06/06/2022  Component Date Value Ref Range Status   Sodium 06/06/2022 140  135 - 145 mmol/L Final   Potassium 06/06/2022 3.2 (L)  3.5 - 5.1 mmol/L Final   Chloride 06/06/2022 103  98 - 111 mmol/L Final   CO2 06/06/2022 26  22 - 32 mmol/L Final   Glucose, Bld 06/06/2022 97  70 - 99 mg/dL Final   Glucose reference range applies only to samples taken after fasting for at least 8 hours.   BUN 06/06/2022 6  6 - 20 mg/dL Final   Creatinine, Ser 06/06/2022 0.54  0.44 - 1.00 mg/dL Final   Calcium 06/06/2022 9.0  8.9 - 10.3 mg/dL Final   Total Protein 06/06/2022 6.5  6.5 - 8.1 g/dL Final   Albumin 06/06/2022 3.6  3.5 - 5.0 g/dL Final   AST 06/06/2022 20  15 - 41 U/L Final   ALT 06/06/2022 17  0 - 44 U/L Final   Alkaline Phosphatase 06/06/2022 111  38 - 126 U/L Final   Total Bilirubin 06/06/2022 0.3  0.3 - 1.2 mg/dL Final   GFR, Estimated 06/06/2022 >60  >60 mL/min Final   Comment: (NOTE) Calculated using the CKD-EPI Creatinine Equation (2021)    Anion gap 06/06/2022 11  5 - 15 Final   Performed at [place] [address] [place], [place] [address]   Alcohol, Ethyl (B) 06/06/2022 18 (H)  <10 mg/dL Final   Comment: (NOTE) Lowest detectable limit for serum alcohol is 10 mg/dL.  For medical purposes only. Performed at [facility], [place] [address] [place], [place] [address]    WBC 06/06/2022 6.0  4.0 - 10.5 K/uL Final   RBC 06/06/2022 4.76  3.87 - 5.11 MIL/uL Final   Hemoglobin 06/06/2022 14.5  12.0 - 15.0 g/dL Final   HCT 06/06/2022 43.9  36.0 - 46.0 % Final   MCV 06/06/2022 92.2  80.0 - 100.0 fL Final   MCH 06/06/2022 30.5  26.0 - 34.0 pg Final   MCHC 06/06/2022 33.0  30.0 - 36.0 g/dL Final   RDW 06/06/2022 13.5  11.5 - 15.5 % Final   Platelets 06/06/2022 224  150 - 400 K/uL Final   nRBC 06/06/2022 0.0  0.0 - 0.2 % Final   Performed at [facility], [place] [address] [place], [place] [address]   I-stat hCG, quantitative 06/06/2022 <5.0  <5 mIU/mL Final   Comment 3 06/06/2022  Final   Comment:   GEST. AGE      CONC.  (mIU/mL)   <=1 WEEK        5 - 50     2 WEEKS       50 - 500     3 WEEKS       100 - 10,000     4 WEEKS     1,000 - 30,000        FEMALE AND NON-PREGNANT FEMALE:     LESS THAN 5 mIU/mL   Admission on 06/05/2022, Discharged on 06/05/2022  Component Date Value Ref Range Status   Sodium 06/05/2022 139  135 - 145 mmol/L Final   Potassium 06/05/2022 3.7  3.5 - 5.1 mmol/L Final   Chloride 06/05/2022 105  98 - 111 mmol/L Final   CO2 06/05/2022 26  22 - 32 mmol/L Final   Glucose, Bld 06/05/2022 114 (H)  70 - 99 mg/dL Final   Glucose reference range applies only to samples taken after fasting for at least 8 hours.   BUN 06/05/2022 8  6 - 20 mg/dL Final   Creatinine, Ser 06/05/2022 0.50  0.44 - 1.00 mg/dL Final   Calcium 06/05/2022 8.8 (L)  8.9 - 10.3 mg/dL Final   Total Protein 06/05/2022 7.1  6.5 - 8.1 g/dL Final   Albumin 06/05/2022 3.9  3.5 - 5.0 g/dL Final   AST 06/05/2022 20  15 - 41 U/L Final   ALT 06/05/2022 17  0 - 44 U/L Final   Alkaline Phosphatase 06/05/2022 120  38 - 126 U/L Final   Total Bilirubin 06/05/2022 0.5  0.3 - 1.2 mg/dL Final   GFR, Estimated 06/05/2022 >60  >60 mL/min Final   Comment: (NOTE) Calculated using the CKD-EPI Creatinine Equation (2021)     Anion gap 06/05/2022 8  5 - 15 Final   Performed at [facility], [place] [address] [place], [place] [address]   WBC 06/05/2022 4.9  4.0 - 10.5 K/uL Final   RBC 06/05/2022 5.00  3.87 - 5.11 MIL/uL Final   Hemoglobin 06/05/2022 15.1 (H)  12.0 - 15.0 g/dL Final   HCT 06/05/2022 46.0  36.0 - 46.0 % Final   MCV 06/05/2022 92.0  80.0 - 100.0 fL Final   MCH 06/05/2022 30.2  26.0 - 34.0 pg Final   MCHC 06/05/2022 32.8  30.0 - 36.0 g/dL Final   RDW 06/05/2022 13.6  11.5 - 15.5 % Final   Platelets 06/05/2022 248  150 - 400 K/uL Final   nRBC 06/05/2022 0.0  0.0 - 0.2 % Final   Performed at [facility], [place] [address] [place], [place] [address]   Opiates 06/05/2022 NONE DETECTED  NONE DETECTED Final   Cocaine 06/05/2022 NONE DETECTED  NONE DETECTED Final   Benzodiazepines 06/05/2022 NONE DETECTED  NONE DETECTED Final   Amphetamines 06/05/2022 POSITIVE (A)  NONE DETECTED Final   Tetrahydrocannabinol 06/05/2022 NONE DETECTED  NONE DETECTED Final   Barbiturates 06/05/2022 NONE DETECTED  NONE DETECTED Final   Comment: (NOTE) DRUG SCREEN FOR MEDICAL PURPOSES ONLY.  IF CONFIRMATION IS NEEDED FOR ANY PURPOSE, NOTIFY LAB WITHIN 5 DAYS.  LOWEST DETECTABLE LIMITS FOR URINE DRUG SCREEN Drug Class                     Cutoff (ng/mL) Amphetamine and metabolites    1000 Barbiturate and metabolites    200 Benzodiazepine  200 Opiates and metabolites        300 Cocaine and metabolites        300 THC                            50 Performed at [facility], [address] [place], [place] [address]    Alcohol, Ethyl (B) 06/05/2022 <10  <10 mg/dL Final   Comment: (NOTE) Lowest detectable limit for serum alcohol is 10 mg/dL.  For medical purposes only. Performed at [facility], [address], [place], [place] [address]   Admission on 05/10/2022, Discharged on 05/10/2022  Component Date Value Ref Range Status    Opiates 05/10/2022 NONE DETECTED  NONE DETECTED Final   Cocaine 05/10/2022 POSITIVE (A)  NONE DETECTED Final   Benzodiazepines 05/10/2022 NONE DETECTED  NONE DETECTED Final   Amphetamines 05/10/2022 NONE DETECTED  NONE DETECTED Final   Tetrahydrocannabinol 05/10/2022 NONE DETECTED  NONE DETECTED Final   Barbiturates 05/10/2022 NONE DETECTED  NONE DETECTED Final   Comment: (NOTE) DRUG SCREEN FOR MEDICAL PURPOSES ONLY.  IF CONFIRMATION IS NEEDED FOR ANY PURPOSE, NOTIFY LAB WITHIN 5 DAYS.  LOWEST DETECTABLE LIMITS FOR URINE DRUG SCREEN Drug Class                     Cutoff (ng/mL) Amphetamine and metabolites    1000 Barbiturate and metabolites    200 Benzodiazepine                 200 Tricyclics and metabolites     300 Opiates and metabolites        300 Cocaine and metabolites        300 THC                            50 Performed at [facility], [address], [place], [place] [address]    Alcohol, Ethyl (B) 05/10/2022 <10  <10 mg/dL Final   Comment: (NOTE) Lowest detectable limit for serum alcohol is 10 mg/dL.  For medical purposes only. Performed at [facility], [address], [place], [place] [address]    Sodium 05/10/2022 137  135 - 145 mmol/L Final   Potassium 05/10/2022 4.2  3.5 - 5.1 mmol/L Final   Chloride 05/10/2022 102  98 - 111 mmol/L Final   CO2 05/10/2022 29  22 - 32 mmol/L Final   Glucose, Bld 05/10/2022 90  70 - 99 mg/dL Final   Glucose reference range applies only to samples taken after fasting for at least 8 hours.   BUN 05/10/2022 11  6 - 20 mg/dL Final   Creatinine, Ser 05/10/2022 0.57  0.44 - 1.00 mg/dL Final   Calcium 65/78/4696 8.7 (L)  8.9 - 10.3 mg/dL Final   Total Protein 29/52/8413 7.2  6.5 - 8.1 g/dL Final   Albumin 24/40/1027 3.7  3.5 - 5.0 g/dL Final   AST 25/36/6440 18  15 - 41 U/L Final   ALT 05/10/2022 16  0 - 44 U/L Final   Alkaline Phosphatase 05/10/2022 97  38 - 126 U/L Final   Total Bilirubin  05/10/2022 0.5  0.3 - 1.2 mg/dL Final   GFR, Estimated 05/10/2022 >60  >60 mL/min Final   Comment: (NOTE) Calculated using the CKD-EPI Creatinine Equation (2021)    Anion gap 05/10/2022 6  5 - 15 Final  Performed at [facility], [place] [address], [place], [place] [address]   WBC 05/10/2022 6.4  4.0 - 10.5 K/uL Final   RBC 05/10/2022 5.56 (H)  3.87 - 5.11 MIL/uL Final   Hemoglobin 05/10/2022 16.3 (H)  12.0 - 15.0 g/dL Final   HCT 05/10/2022 50.7 (H)  36.0 - 46.0 % Final   MCV 05/10/2022 91.2  80.0 - 100.0 fL Final   MCH 05/10/2022 29.3  26.0 - 34.0 pg Final   MCHC 05/10/2022 32.1  30.0 - 36.0 g/dL Final   RDW 05/10/2022 13.2  11.5 - 15.5 % Final   Platelets 05/10/2022 250  150 - 400 K/uL Final   nRBC 05/10/2022 0.0  0.0 - 0.2 % Final   Neutrophils Relative % 05/10/2022 49  % Final   Neutro Abs 05/10/2022 3.2  1.7 - 7.7 K/uL Final   Lymphocytes Relative 05/10/2022 40  % Final   Lymphs Abs 05/10/2022 2.6  0.7 - 4.0 K/uL Final   Monocytes Relative 05/10/2022 8  % Final   Monocytes Absolute 05/10/2022 0.5  0.1 - 1.0 K/uL Final   Eosinophils Relative 05/10/2022 1  % Final   Eosinophils Absolute 05/10/2022 0.0  0.0 - 0.5 K/uL Final   Basophils Relative 05/10/2022 1  % Final   Basophils Absolute 05/10/2022 0.0  0.0 - 0.1 K/uL Final   Immature Granulocytes 05/10/2022 1  % Final   Abs Immature Granulocytes 05/10/2022 0.04  0.00 - 0.07 K/uL Final   Performed at [facility], [place] [address] [place], [place] [address]   Lipase 05/10/2022 34  11 - 51 U/L Final   Performed at [facility], [place] [address] [place], [place] [address]  Admission on 05/08/2022, Discharged on 05/09/2022  Component Date Value Ref Range Status   SARS Coronavirus 2 by RT PCR 05/08/2022 NEGATIVE  NEGATIVE Final   Comment: (NOTE) SARS-CoV-2 target nucleic acids are NOT DETECTED.  The SARS-CoV-2 RNA is generally detectable in upper respiratory specimens during  the acute phase of infection. The lowest concentration of SARS-CoV-2 viral copies this assay can detect is 138 copies/mL. A negative result does not preclude SARS-Cov-2 infection and should not be used as the sole basis for treatment or other patient management decisions. A negative result may occur with  improper specimen collection/handling, submission of specimen other than nasopharyngeal swab, presence of viral mutation(s) within the areas targeted by this assay, and inadequate number of viral copies(<138 copies/mL). A negative result must be combined with clinical observations, patient history, and epidemiological information. The expected result is Negative.  Fact Sheet for Patients:  EntrepreneurPulse.com.au  Fact Sheet for Healthcare Providers:  IncredibleEmployment.be  This test is no                          t yet approved or cleared by the [place] FDA and  has been authorized for detection and/or diagnosis of SARS-CoV-2 by FDA under an Emergency Use Authorization (EUA). This EUA will remain  in effect (meaning this test can be used) for the duration of the COVID-19 declaration under Section 564(b)(1) of the Act, 21 U.S.C.section 360bbb-3(b)(1), unless the authorization is terminated  or revoked sooner.       Influenza A by PCR 05/08/2022 NEGATIVE  NEGATIVE Final   Influenza B by PCR 05/08/2022 NEGATIVE  NEGATIVE Final   Comment: (NOTE) The Xpert Xpress SARS-CoV-2/FLU/RSV plus assay is intended as an aid in the diagnosis of  influenza from Nasopharyngeal swab specimens and should not be used as a sole basis for treatment. Nasal washings and aspirates are unacceptable for Xpert Xpress SARS-CoV-2/FLU/RSV testing.  Fact Sheet for Patients: EntrepreneurPulse.com.au  Fact Sheet for Healthcare Providers: IncredibleEmployment.be  This test is not yet approved or cleared by the [place] FDA  and has been authorized for detection and/or diagnosis of SARS-CoV-2 by FDA under an Emergency Use Authorization (EUA). This EUA will remain in effect (meaning this test can be used) for the duration of the COVID-19 declaration under Section 564(b)(1) of the Act, 21 U.S.C. section 360bbb-3(b)(1), unless the authorization is terminated or revoked.  Performed at [facility], [place] [address] [place], [place] [address]    WBC 05/08/2022 6.5  4.0 - 10.5 K/uL Final   RBC 05/08/2022 5.36 (H)  3.87 - 5.11 MIL/uL Final   Hemoglobin 05/08/2022 16.1 (H)  12.0 - 15.0 g/dL Final   HCT 05/08/2022 47.9 (H)  36.0 - 46.0 % Final   MCV 05/08/2022 89.4  80.0 - 100.0 fL Final   MCH 05/08/2022 30.0  26.0 - 34.0 pg Final   MCHC 05/08/2022 33.6  30.0 - 36.0 g/dL Final   RDW 05/08/2022 13.2  11.5 - 15.5 % Final   Platelets 05/08/2022 237  150 - 400 K/uL Final   nRBC 05/08/2022 0.0  0.0 - 0.2 % Final   Neutrophils Relative % 05/08/2022 37  % Final   Neutro Abs 05/08/2022 2.4  1.7 - 7.7 K/uL Final   Lymphocytes Relative 05/08/2022 52  % Final   Lymphs Abs 05/08/2022 3.4  0.7 - 4.0 K/uL Final   Monocytes Relative 05/08/2022 8  % Final   Monocytes Absolute 05/08/2022 0.5  0.1 - 1.0 K/uL Final   Eosinophils Relative 05/08/2022 1  % Final   Eosinophils Absolute 05/08/2022 0.1  0.0 - 0.5 K/uL Final   Basophils Relative 05/08/2022 1  % Final   Basophils Absolute 05/08/2022 0.0  0.0 - 0.1 K/uL Final   Immature Granulocytes 05/08/2022 1  % Final   Abs Immature Granulocytes 05/08/2022 0.05  0.00 - 0.07 K/uL Final   Performed at [facility], [place] [address] [place], [place] [address]   Sodium 05/08/2022 135  135 - 145 mmol/L Final   Potassium 05/08/2022 3.9  3.5 - 5.1 mmol/L Final   Chloride 05/08/2022 100  98 - 111 mmol/L Final   CO2 05/08/2022 27  22 - 32 mmol/L Final   Glucose, Bld 05/08/2022 93  70 - 99 mg/dL Final   Glucose reference range applies only to samples taken after fasting for at  least 8 hours.   BUN 05/08/2022 8  6 - 20 mg/dL Final   Creatinine, Ser 05/08/2022 0.68  0.44 - 1.00 mg/dL Final   Calcium 05/08/2022 9.0  8.9 - 10.3 mg/dL Final   Total Protein 05/08/2022 6.6  6.5 - 8.1 g/dL Final   Albumin 05/08/2022 3.7  3.5 - 5.0 g/dL Final   AST 05/08/2022 17  15 - 41 U/L Final   ALT 05/08/2022 17  0 - 44 U/L Final   Alkaline Phosphatase 05/08/2022 111  38 - 126 U/L Final   Total Bilirubin 05/08/2022 0.6  0.3 - 1.2 mg/dL Final   GFR, Estimated 05/08/2022 >60  >60 mL/min Final   Comment: (NOTE) Calculated using the CKD-EPI Creatinine Equation (2021)    Anion gap 05/08/2022 8  5 - 15 Final   Performed at [place]  [address] [place], [place] [address]   Hgb A1c MFr Bld 05/08/2022 4.9  4.8 - 5.6 % Final   Comment: (NOTE) Pre diabetes:          5.7%-6.4%  Diabetes:              >6.4%  Glycemic control for   <7.0% adults with diabetes    Mean Plasma Glucose 05/08/2022 93.93  mg/dL Final   Performed at [facility], [place] [address], [place], [place] [address]   Preg Test, Ur 05/08/2022 NEGATIVE  NEGATIVE Final   Performed at [facility], [place] [address] [place], [place] [address]   POC Amphetamine UR 05/08/2022 None Detected  NONE DETECTED (Cut Off Level 1000 ng/mL) Final   POC Secobarbital (BAR) 05/08/2022 None Detected  NONE DETECTED (Cut Off Level 300 ng/mL) Final   POC Buprenorphine (BUP) 05/08/2022 None Detected  NONE DETECTED (Cut Off Level 10 ng/mL) Final   POC Oxazepam (BZO) 05/08/2022 None Detected  NONE DETECTED (Cut Off Level 300 ng/mL) Final   POC Cocaine UR 05/08/2022 None Detected  NONE DETECTED (Cut Off Level 300 ng/mL) Final   POC Methamphetamine UR 05/08/2022 Positive (A)  NONE DETECTED (Cut Off Level 1000 ng/mL) Final   POC Morphine 05/08/2022 None Detected  NONE DETECTED (Cut Off Level 300 ng/mL) Final   POC Methadone UR 05/08/2022 None Detected  NONE DETECTED (Cut Off Level 300 ng/mL) Final   POC Oxycodone UR  05/08/2022 None Detected  NONE DETECTED (Cut Off Level 100 ng/mL) Final   POC Marijuana UR 05/08/2022 None Detected  NONE DETECTED (Cut Off Level 50 ng/mL) Final   SARSCOV2ONAVIRUS 2 AG 05/08/2022 NEGATIVE  NEGATIVE Final   Comment: (NOTE) SARS-CoV-2 antigen NOT DETECTED.   Negative results are presumptive.  Negative results do not preclude SARS-CoV-2 infection and should not be used as the sole basis for treatment or other patient management decisions, including infection  control decisions, particularly in the presence of clinical signs and  symptoms consistent with COVID-19, or in those who have been in contact with the virus.  Negative results must be combined with clinical observations, patient history, and epidemiological information. The expected result is Negative.  Fact Sheet for Patients: HandmadeRecipes.com.cy  Fact Sheet for Healthcare Providers: FuneralLife.at  This test is not yet approved or cleared by the [place] FDA and  has been authorized for detection and/or diagnosis of SARS-CoV-2 by FDA under an Emergency Use Authorization (EUA).  This EUA will remain in effect (meaning this test can be used) for the duration of  the COV                          ID-19 declaration under Section 564(b)(1) of the Act, 21 U.S.C. section 360bbb-3(b)(1), unless the authorization is terminated or revoked sooner.     Preg Test, Ur 05/08/2022 NEGATIVE  NEGATIVE Final   Comment:        THE SENSITIVITY OF THIS METHODOLOGY IS >24 mIU/mL    Cholesterol 05/08/2022 187  0 - 200 mg/dL Final   Triglycerides 05/08/2022 117  <150 mg/dL Final   HDL 05/08/2022 67  >40 mg/dL Final   Total CHOL/HDL Ratio 05/08/2022 2.8  RATIO Final   VLDL 05/08/2022 23  0 - 40 mg/dL Final   LDL Cholesterol 05/08/2022 97  0 - 99 mg/dL Final   Comment:        Total Cholesterol/HDL:CHD Risk Coronary Heart Disease Risk Table  Men   Women   1/2 Average Risk   3.4   3.3  Average Risk       5.0   4.4  2 X Average Risk   9.6   7.1  3 X Average Risk  23.4   11.0        Use the calculated Patient Ratio above and the CHD Risk Table to determine the patient's CHD Risk.        ATP III CLASSIFICATION (LDL):  <100     mg/dL   Optimal  100-129  mg/dL   Near or Above                    Optimal  130-159  mg/dL   Borderline  160-189  mg/dL   High  >190     mg/dL   Very High Performed at [place] [address] [place], [place] [address]    TSH 05/08/2022 3.264  0.350 - 4.500 uIU/mL Final   Comment: Performed by a 3rd Generation assay with a functional sensitivity of <=0.01 uIU/mL. Performed at [facility], [place] [address] [place], [place] [address]   Admission on 03/20/2022, Discharged on 03/20/2022  Component Date Value Ref Range Status   WBC 03/20/2022 9.3  4.0 - 10.5 K/uL Final   RBC 03/20/2022 4.76  3.87 - 5.11 MIL/uL Final   Hemoglobin 03/20/2022 14.2  12.0 - 15.0 g/dL Final   HCT 03/20/2022 44.0  36.0 - 46.0 % Final   MCV 03/20/2022 92.4  80.0 - 100.0 fL Final   MCH 03/20/2022 29.8  26.0 - 34.0 pg Final   MCHC 03/20/2022 32.3  30.0 - 36.0 g/dL Final   RDW 03/20/2022 12.4  11.5 - 15.5 % Final   Platelets 03/20/2022 286  150 - 400 K/uL Final   nRBC 03/20/2022 0.0  0.0 - 0.2 % Final   Neutrophils Relative % 03/20/2022 56  % Final   Neutro Abs 03/20/2022 5.3  1.7 - 7.7 K/uL Final   Lymphocytes Relative 03/20/2022 32  % Final   Lymphs Abs 03/20/2022 3.0  0.7 - 4.0 K/uL Final   Monocytes Relative 03/20/2022 8  % Final   Monocytes Absolute 03/20/2022 0.7  0.1 - 1.0 K/uL Final   Eosinophils Relative 03/20/2022 2  % Final   Eosinophils Absolute 03/20/2022 0.2  0.0 - 0.5 K/uL Final   Basophils Relative 03/20/2022 1  % Final   Basophils Absolute 03/20/2022 0.1  0.0 - 0.1 K/uL Final   Immature Granulocytes 03/20/2022 1  % Final   Abs Immature Granulocytes 03/20/2022 0.06  0.00 - 0.07 K/uL Final   Performed  at [facility], [place] [address], [place], [place] [address]   Sodium 03/20/2022 140  135 - 145 mmol/L Final   Potassium 03/20/2022 4.2  3.5 - 5.1 mmol/L Final   Chloride 03/20/2022 106  98 - 111 mmol/L Final   CO2 03/20/2022 25  22 - 32 mmol/L Final   Glucose, Bld 03/20/2022 89  70 - 99 mg/dL Final   Glucose reference range applies only to samples taken after fasting for at least 8 hours.   BUN 03/20/2022 17  6 - 20 mg/dL Final   Creatinine, Ser 03/20/2022 0.64  0.44 - 1.00 mg/dL Final   Calcium 03/20/2022 9.2  8.9 - 10.3 mg/dL Final   Total Protein 03/20/2022 7.1  6.5 - 8.1 g/dL Final   Albumin 03/20/2022 4.1  3.5 - 5.0 g/dL  Final   AST 03/20/2022 18  15 - 41 U/L Final   ALT 03/20/2022 26  0 - 44 U/L Final   Alkaline Phosphatase 03/20/2022 86  38 - 126 U/L Final   Total Bilirubin 03/20/2022 0.4  0.3 - 1.2 mg/dL Final   GFR, Estimated 03/20/2022 >60  >60 mL/min Final   Comment: (NOTE) Calculated using the CKD-EPI Creatinine Equation (2021)    Anion gap 03/20/2022 9  5 - 15 Final   Performed at [facility], [place] [address], [place], [place] [address]   Color, Urine 03/20/2022 STRAW (A)  YELLOW Final   APPearance 03/20/2022 CLEAR  CLEAR Final   Specific Gravity, Urine 03/20/2022 1.008  1.005 - 1.030 Final   pH 03/20/2022 6.0  5.0 - 8.0 Final   Glucose, UA 03/20/2022 NEGATIVE  NEGATIVE mg/dL Final   Hgb urine dipstick 03/20/2022 NEGATIVE  NEGATIVE Final   Bilirubin Urine 03/20/2022 NEGATIVE  NEGATIVE Final   Ketones, ur 03/20/2022 NEGATIVE  NEGATIVE mg/dL Final   Protein, ur 03/20/2022 NEGATIVE  NEGATIVE mg/dL Final   Nitrite 03/20/2022 NEGATIVE  NEGATIVE Final   Leukocytes,Ua 03/20/2022 NEGATIVE  NEGATIVE Final   Performed at [facility], [place] [address], [place], [place] [address]  There may be more visits with results that are not included.    Allergies: Patient has no known allergies.  PTA Medications: (Not in a hospital admission)   Long Term Goals:  Improvement in symptoms so as ready for discharge  Short Term Goals: Patient will verbalize feelings in meetings with treatment team members., Patient will attend at least of 50% of the groups daily., Pt will complete the PHQ9 on admission, day 3 and discharge., Patient will participate in completing the Esmeralda, Patient will score a low risk of violence for 24 hours prior to discharge, and Patient will take medications as prescribed daily.  Medical Decision Making  [facility] [employer] [facility] under the service of Hampton Abbot, MD for Alcohol-induced mood disorder with depressive symptoms [facility]), crisis management, and stabilization. Routine labs ordered, which include Lab Orders         Resp panel by RT-PCR (RSV, Flu A&B, Covid) Anterior Nasal Swab         CBC with Differential/Platelet         Comprehensive metabolic panel         Hemoglobin A1c         Magnesium         Ethanol         Lipid panel         Urinalysis, Routine w reflex microscopic Urine, Clean Catch         HIV Antibody (routine testing w rflx)         POC urine preg, ED         POCT Urine Drug Screen - (I-Screen)    Medication Management: Medications started Meds ordered this encounter  Medications   acetaminophen (TYLENOL) tablet 650 mg   alum & mag hydroxide-simeth (MAALOX/MYLANTA) 200-200-20 MG/5ML suspension 30 mL   magnesium hydroxide (MILK OF MAGNESIA) suspension 30 mL   traZODone (DESYREL) tablet 50 mg   thiamine (VITAMIN B1) injection 100 mg   multivitamin with minerals tablet 1 tablet   chlordiazePOXIDE (LIBRIUM) capsule 25 mg   hydrOXYzine (ATARAX) tablet 25 mg   loperamide (IMODIUM) capsule 2-4 mg   ondansetron (ZOFRAN-ODT) disintegrating tablet 4  mg   FOLLOWED BY Linked Order Group    chlordiazePOXIDE (LIBRIUM) capsule 25 mg    chlordiazePOXIDE (LIBRIUM) capsule 25 mg    chlordiazePOXIDE (LIBRIUM)  capsule 25 mg    chlordiazePOXIDE (LIBRIUM) capsule 25 mg   escitalopram (LEXAPRO) tablet 10 mg   ARIPiprazole (ABILIFY) tablet 10 mg   nicotine (NICODERM CQ - dosed in mg/24 hours) patch 21 mg   Rivaroxaban (XARELTO) tablet 15 mg   Will maintain observation checks every 15 minutes for safety. Psychosocial education regarding relapse prevention and self-care; social and communication  Social work will consult with family for collateral information and discuss discharge and follow up plan.    Recommendations  Based on my evaluation the patient does not appear to have an emergency medical condition.  Kristina Hannay, NP 08/20/22  5:08 PM
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
## 2022-08-20 NOTE — ED Notes (Signed)
Patient arrived on unit. Patient cooperative. Patient quite in assigned area. Patient safe on unit with  continued monitoring.

## 2022-08-21 DIAGNOSIS — F1910 Other psychoactive substance abuse, uncomplicated: Secondary | ICD-10-CM | POA: Diagnosis not present

## 2022-08-21 DIAGNOSIS — Z1152 Encounter for screening for COVID-19: Secondary | ICD-10-CM | POA: Diagnosis not present

## 2022-08-21 DIAGNOSIS — F1094 Alcohol use, unspecified with alcohol-induced mood disorder: Secondary | ICD-10-CM | POA: Diagnosis not present

## 2022-08-21 DIAGNOSIS — Z5900 Homelessness unspecified: Secondary | ICD-10-CM | POA: Diagnosis not present

## 2022-08-21 MED ORDER — ADULT MULTIVITAMIN W/MINERALS CH: 1.0000 | ORAL_TABLET | Freq: Every day | ORAL | Status: DC

## 2022-08-21 MED ORDER — CHLORDIAZEPOXIDE HCL 25 MG PO CAPS: 25.0000 mg | ORAL_CAPSULE | Freq: Every day | ORAL | Status: DC

## 2022-08-21 MED ORDER — THIAMINE MONONITRATE 100 MG PO TABS: 100.0000 mg | ORAL_TABLET | Freq: Every day | ORAL | Status: DC

## 2022-08-21 MED ORDER — CHLORDIAZEPOXIDE HCL 25 MG PO CAPS: 25.0000 mg | ORAL_CAPSULE | Freq: Three times a day (TID) | ORAL | Status: DC

## 2022-08-21 MED ORDER — CHLORDIAZEPOXIDE HCL 25 MG PO CAPS: 25.0000 mg | ORAL_CAPSULE | Freq: Four times a day (QID) | ORAL | Status: DC

## 2022-08-21 MED ORDER — CHLORDIAZEPOXIDE HCL 25 MG PO CAPS: 25.0000 mg | ORAL_CAPSULE | ORAL | Status: DC

## 2022-08-21 MED ORDER — LOPERAMIDE HCL 2 MG PO CAPS: 2.0000 mg | ORAL_CAPSULE | ORAL | Status: AC | PRN

## 2022-08-21 MED ORDER — THIAMINE MONONITRATE 100 MG PO TABS
100.0000 mg | ORAL_TABLET | Freq: Every day | ORAL | Status: DC
  Administered 2022-08-22 – 2022-08-27 (×6): 100 mg via ORAL
  Filled 2022-08-21 (×6): qty 1

## 2022-08-21 MED ORDER — FLUTICASONE PROPIONATE 50 MCG/ACT NA SUSP
1.0000 | Freq: Every day | NASAL | Status: DC
  Administered 2022-08-21 – 2022-08-27 (×7): 1 via NASAL
  Filled 2022-08-21: qty 16

## 2022-08-21 MED ORDER — LOPERAMIDE HCL 2 MG PO CAPS: 2.0000 mg | ORAL_CAPSULE | ORAL | Status: DC | PRN

## 2022-08-21 NOTE — Discharge Planning (Addendum)
Patient has been referred to the following facilities for review:  [facility] in [facility]   LCSW followed up with the following facilities and was informed that there are no beds available or patient does not meet criteria:  [facility]: due to insurance  [facility]: No beds available [facility]: Currently closed  [facility]: must have attended inpatient treatment for a minimum of 2 weeks.  [facility]: no beds available per Mrs. Terry  Patient was also provided a list of long-term residential facilities to follow up with to complete phone screening. [facility], [facility], [facility], and [facility]. LCSW will continue to follow and provide support to patient while on [facility] unit.   [name], LCSW Clinical Social Worker [place] [facility] Ph: [phone]

## 2022-08-21 NOTE — ED Notes (Signed)
Patient is resting comfortably. 

## 2022-08-21 NOTE — Tx Team (Signed)
LCSW met with patient to assess current mood, affect, physical state, and inquire about needs/goals while here in [facility] and after discharge. Patient reports she presented due to homelessness and needing to detox from alcohol. Patient reports she has been homeless for many years, and reports she has been depressed and has not been able to stop drinking. Patient reports her triggering factors are homelessness, having no finances, no friends, and legal issues pending. Patient reports she has an upcoming court date on August 28, 2022. Patient aware that this may be a barrier for placement, however LCSW will continue to assist with locating residential placement for her. Patient was recently in the [facility] about a month ago, and the plan was for patient to discharge to [facility]. However, patient reports she was denied from the agency due to not having her blood thinner medication. Patient reports this caused her to relapse and end up back on the streets. Patient reports her current goal is to seek residential placement at this time. Patient reports occasional use of meth when she is able to access it. 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 Patient reports she uses on average twice a week, and states it's whatever amount she can get access to. LCSW explored if patient was interested in any [employer] and patient declined. Patient asked if she could be referred back to [facility] or another 28-30 day residential program. Patient aware that LCSW will make an attempt to locate placement for her. Patient expressed understanding and appreciation for LCSW assistance. Patient currently denies any SI/HI/AVH and reports mood as "not so well stating, I feel like I might have pneumonia". Patient encouraged to inform RN of symptoms for further assessment. Patient aware that LCSW will send referrals out for review and will follow up to provide updates as received. Patient expressed understanding and  appreciation of LCSW assistance. No other needs were reported at this time by patient.   Kristina Conn, LCSW Clinical Social Worker [place] [facility] Ph: [phone]

## 2022-08-21 NOTE — ED Notes (Signed)
Pt is in the bed sleeping. Respirations are even and unlabored. No acute distress noted. Will continue to monitor for safety. 

## 2022-08-21 NOTE — ED Provider Notes (Signed)
Behavioral Health Progress Note  Date and Time: 08/21/2022 1:37 PM Name: Kristina Owens MRN:  [id number]  Subjective:   The patient is a 57 year old female well-known to this service line.  She has a history of cocaine, alcohol, and methamphetamine use disorders.  She has been homeless for some time.  She has had many stays at the [facility] facility base crisis.  The last 1 was in December, when she was discharged to [facility] residential.  She reported to LCSW that she was denied because she did not have her Xarelto.  On initial interview the patient appeared to have physical symptoms, possibly of methamphetamine withdrawal (the only substance for which she was positive in her UDS).  Started on Librium taper due to concern for alcohol withdrawal.  Unable to discuss the patient's preferences for treatment plan given withdrawal.  Will discuss tomorrow.  The patient denies auditory/visual hallucinations and first rank symptoms. They deny suicidal and homicidal thoughts. The patient denies side effects from their medications.  Review of systems as below.    Diagnosis:  Final diagnoses:  Alcohol use disorder, severe, dependence (HCC)  Alcohol-induced mood disorder with depressive symptoms (HCC)  Polysubstance abuse (HCC)  Homelessness    Total Time spent with patient: 20 minutes  Past Psychiatric History: as above Past Medical History:  Past Medical History:  Diagnosis Date   Anxiety    Depression    DVT (deep vein thrombosis) in pregnancy    GERD (gastroesophageal reflux disease)    Ovarian cyst     Past Surgical History:  Procedure Laterality Date   APPENDECTOMY  age 57   IR PTA VENOUS EXCEPT DIALYSIS CIRCUIT  01/16/2021   IR RADIOLOGIST EVAL & MGMT  03/19/2021   IR THROMBECT VENO MECH MOD SED  01/16/2021   IR US GUIDE VASC ACCESS LEFT  01/16/2021   IR VENO/EXT/UNI LEFT  01/16/2021   IR VENOCAVAGRAM IVC  01/16/2021   TONSILLECTOMY Bilateral age 5   Family History:  Family  History  Problem Relation Age of Onset   Asthma Mother    COPD Mother    Cancer Father        thyroid cancer   Family Psychiatric  History: none Social History:  Social History   Substance and Sexual Activity  Alcohol Use Yes     Social History   Substance and Sexual Activity  Drug Use Yes   Types: "Crack" cocaine, Cocaine, Methamphetamines   Comment: last used 4/31/22    Social History   Socioeconomic History   Marital status: Divorced    Spouse name: Not on file   Number of children: Not on file   Years of education: Not on file   Highest education level: Not on file  Occupational History   Not on file  Tobacco Use   Smoking status: Every Day    Packs/day: 0.50    Years: 35.00    Total pack years: 17.50    Types: Cigarettes   Smokeless tobacco: Never  Vaping Use   Vaping Use: Never used  Substance and Sexual Activity   Alcohol use: Yes   Drug use: Yes    Types: "Crack" cocaine, Cocaine, Methamphetamines    Comment: last used 4/31/22   Sexual activity: Not on file  Other Topics Concern   Not on file  Social History Narrative   Not on file   Social Determinants of Health   Financial Resource Strain: Not on file  Food Insecurity: Not on file  Transportation  Needs: Not on file  Physical Activity: Not on file  Stress: Not on file  Social Connections: Not on file   SDOH:  SDOH Screenings   Alcohol Screen: High Risk (03/10/2022)  Depression (PHQ2-9): High Risk (08/20/2022)  Tobacco Use: High Risk (08/20/2022)   Additional Social History:    Pain Medications: See MAR Prescriptions: See MAR Over the Counter: See MAR History of alcohol / drug use?: Yes Longest period of sobriety (when/how long): 2 months Negative Consequences of Use: Legal Withdrawal Symptoms: Nausea / Vomiting, Tremors, Fever / Chills, Sweats, Other (Comment) (Headache) Name of Substance 1: Alcohol 1 - Age of First Use: 16 1 - Amount (size/oz): 1 pint 1 - Frequency: daily 1 -  Duration: ongoing 1 - Last Use / Amount: 2 hours ago 1 - Method of Aquiring: purchase 1- Route of Use: drinking Name of Substance 2: Methamphetamine 2 - Age of First Use: 47 2 - Amount (size/oz): $20 worth 2 - Frequency: Weekly 2 - Duration: Ongoing 2 - Last Use / Amount: 5 days ago 2 - Method of Aquiring: UTA 2 - Route of Substance Use: intervenous                Sleep: Fair  Appetite:  Fair  Current Medications:  Current Facility-Administered Medications  Medication Dose Route Frequency Provider Last Rate Last Admin   acetaminophen (TYLENOL) tablet 650 mg  650 mg Oral Q6H PRN Rankin, Shuvon B, NP   650 mg at 08/21/22 1321   alum & mag hydroxide-simeth (MAALOX/MYLANTA) 200-200-20 MG/5ML suspension 30 mL  30 mL Oral Q4H PRN Rankin, Shuvon B, NP       ARIPiprazole (ABILIFY) tablet 10 mg  10 mg Oral Daily Rankin, Shuvon B, NP   10 mg at 08/21/22 [id number]   chlordiazePOXIDE (LIBRIUM) capsule 25 mg  25 mg Oral Q6H PRN Rankin, Shuvon B, NP       chlordiazePOXIDE (LIBRIUM) capsule 25 mg  25 mg Oral QID Rankin, Shuvon B, NP   25 mg at 08/21/22 1320   Followed by   Melene Muller[START ON 08/22/2022] chlordiazePOXIDE (LIBRIUM) capsule 25 mg  25 mg Oral TID Rankin, Shuvon B, NP       Followed by   Melene Muller[START ON 08/23/2022] chlordiazePOXIDE (LIBRIUM) capsule 25 mg  25 mg Oral BH-qamhs Rankin, Shuvon B, NP       Followed by   Melene Muller[START ON 08/24/2022] chlordiazePOXIDE (LIBRIUM) capsule 25 mg  25 mg Oral Daily Rankin, Shuvon B, NP       escitalopram (LEXAPRO) tablet 10 mg  10 mg Oral Daily Rankin, Shuvon B, NP   10 mg at 08/21/22 0918   fluticasone (FLONASE) 50 MCG/ACT nasal spray 1 spray  1 spray Each Nare Daily Carlyn ReichertGabrielle, Tramayne Sebesta, MD   1 spray at 08/21/22 1214   hydrOXYzine (ATARAX) tablet 25 mg  25 mg Oral Q6H PRN Rankin, Shuvon B, NP   25 mg at 08/20/22 2135   loperamide (IMODIUM) capsule 2-4 mg  2-4 mg Oral PRN Rankin, Shuvon B, NP       magnesium hydroxide (MILK OF MAGNESIA) suspension 30 mL  30 mL Oral Daily  PRN Rankin, Shuvon B, NP       multivitamin with minerals tablet 1 tablet  1 tablet Oral Daily Rankin, Shuvon B, NP   1 tablet at 08/21/22 0918   nicotine (NICODERM CQ - dosed in mg/24 hours) patch 21 mg  21 mg Transdermal Daily Rankin, Shuvon B, NP   21 mg at 08/21/22  4098   ondansetron (ZOFRAN-ODT) disintegrating tablet 4 mg  4 mg Oral Q6H PRN Rankin, Shuvon B, NP       rivaroxaban (XARELTO) tablet 20 mg  20 mg Oral Daily Rankin, Shuvon B, NP       thiamine (VITAMIN B1) injection 100 mg  100 mg Intramuscular Once Rankin, Shuvon B, NP       [START ON 08/22/2022] thiamine (VITAMIN B1) tablet 100 mg  100 mg Oral Daily Carlyn Reichert, MD       traZODone (DESYREL) tablet 50 mg  50 mg Oral QHS PRN Rankin, Shuvon B, NP   50 mg at 08/20/22 2135   Current Outpatient Medications  Medication Sig Dispense Refill   ARIPiprazole (ABILIFY) 10 MG tablet Take 1 tablet (10 mg total) by mouth daily. 30 tablet 0   escitalopram (LEXAPRO) 10 MG tablet Take 1 tablet (10 mg total) by mouth daily. 30 tablet 0   hydrOXYzine (ATARAX) 25 MG tablet Take 1 tablet (25 mg total) by mouth 3 (three) times daily as needed for anxiety. 30 tablet 0   naltrexone (DEPADE) 50 MG tablet Take 1 tablet (50 mg total) by mouth daily. 30 tablet 0   RIVAROXABAN (XARELTO) VTE STARTER PACK (15 & 20 MG) Follow package directions: Take one 15mg  tablet by mouth twice a day. On day 22, switch to one 20mg  tablet once a day. Take with food. (Patient taking differently: Take 15-20 mg by mouth See admin instructions. Follow package directions: Take one 15mg  tablet by mouth twice a day. On day 22, switch to one 20mg  tablet once a day. Take with food.) 51 each 0    Labs  Lab Results:  Admission on 08/20/2022  Component Date Value Ref Range Status   SARS Coronavirus 2 by RT PCR 08/20/2022 NEGATIVE  NEGATIVE Final   Comment: (NOTE) SARS-CoV-2 target nucleic acids are NOT DETECTED.  The SARS-CoV-2 RNA is generally detectable in upper  respiratory specimens during the acute phase of infection. The lowest concentration of SARS-CoV-2 viral copies this assay can detect is 138 copies/mL. A negative result does not preclude SARS-Cov-2 infection and should not be used as the sole basis for treatment or other patient management decisions. A negative result may occur with  improper specimen collection/handling, submission of specimen other than nasopharyngeal swab, presence of viral mutation(s) within the areas targeted by this assay, and inadequate number of viral copies(<138 copies/mL). A negative result must be combined with clinical observations, patient history, and epidemiological information. The expected result is Negative.  Fact Sheet for Patients:  BloggerCourse.com  Fact Sheet for Healthcare Providers:  SeriousBroker.it  This test is no                          t yet approved or cleared by the [place] FDA and  has been authorized for detection and/or diagnosis of SARS-CoV-2 by FDA under an Emergency Use Authorization (EUA). This EUA will remain  in effect (meaning this test can be used) for the duration of the COVID-19 declaration under Section 564(b)(1) of the Act, 21 U.S.C.section 360bbb-3(b)(1), unless the authorization is terminated  or revoked sooner.       Influenza A by PCR 08/20/2022 NEGATIVE  NEGATIVE Final   Influenza B by PCR 08/20/2022 NEGATIVE  NEGATIVE Final   Comment: (NOTE) The Xpert Xpress SARS-CoV-2/FLU/RSV plus assay is intended as an aid in the diagnosis of influenza from Nasopharyngeal swab specimens and should not be used  as a sole basis for treatment. Nasal washings and aspirates are unacceptable for Xpert Xpress SARS-CoV-2/FLU/RSV testing.  Fact Sheet for Patients: EntrepreneurPulse.com.au  Fact Sheet for Healthcare Providers: IncredibleEmployment.be  This test is not yet approved or  cleared by the [place] FDA and has been authorized for detection and/or diagnosis of SARS-CoV-2 by FDA under an Emergency Use Authorization (EUA). This EUA will remain in effect (meaning this test can be used) for the duration of the COVID-19 declaration under Section 564(b)(1) of the Act, 21 U.S.C. section 360bbb-3(b)(1), unless the authorization is terminated or revoked.     Resp Syncytial Virus by PCR 08/20/2022 NEGATIVE  NEGATIVE Final   Comment: (NOTE) Fact Sheet for Patients: EntrepreneurPulse.com.au  Fact Sheet for Healthcare Providers: IncredibleEmployment.be  This test is not yet approved or cleared by the [place] FDA and has been authorized for detection and/or diagnosis of SARS-CoV-2 by FDA under an Emergency Use Authorization (EUA). This EUA will remain in effect (meaning this test can be used) for the duration of the COVID-19 declaration under Section 564(b)(1) of the Act, 21 U.S.C. section 360bbb-3(b)(1), unless the authorization is terminated or revoked.  Performed at [facility], [place] [address] [place], [place] [address]    WBC 08/20/2022 5.9  4.0 - 10.5 K/uL Final   RBC 08/20/2022 4.89  3.87 - 5.11 MIL/uL Final   Hemoglobin 08/20/2022 15.2 (H)  12.0 - 15.0 g/dL Final   HCT 08/20/2022 44.4  36.0 - 46.0 % Final   MCV 08/20/2022 90.8  80.0 - 100.0 fL Final   MCH 08/20/2022 31.1  26.0 - 34.0 pg Final   MCHC 08/20/2022 34.2  30.0 - 36.0 g/dL Final   RDW 08/20/2022 13.5  11.5 - 15.5 % Final   Platelets 08/20/2022 294  150 - 400 K/uL Final   nRBC 08/20/2022 0.0  0.0 - 0.2 % Final   Neutrophils Relative % 08/20/2022 54  % Final   Neutro Abs 08/20/2022 3.2  1.7 - 7.7 K/uL Final   Lymphocytes Relative 08/20/2022 34  % Final   Lymphs Abs 08/20/2022 2.0  0.7 - 4.0 K/uL Final   Monocytes Relative 08/20/2022 8  % Final   Monocytes Absolute 08/20/2022 0.5  0.1 - 1.0 K/uL Final   Eosinophils Relative 08/20/2022 2  %  Final   Eosinophils Absolute 08/20/2022 0.1  0.0 - 0.5 K/uL Final   Basophils Relative 08/20/2022 1  % Final   Basophils Absolute 08/20/2022 0.1  0.0 - 0.1 K/uL Final   Immature Granulocytes 08/20/2022 1  % Final   Abs Immature Granulocytes 08/20/2022 0.04  0.00 - 0.07 K/uL Final   Performed at [facility], [place] [address] [place], [place] [address]   Sodium 08/20/2022 140  135 - 145 mmol/L Final   Potassium 08/20/2022 3.9  3.5 - 5.1 mmol/L Final   Chloride 08/20/2022 105  98 - 111 mmol/L Final   CO2 08/20/2022 24  22 - 32 mmol/L Final   Glucose, Bld 08/20/2022 84  70 - 99 mg/dL Final   Glucose reference range applies only to samples taken after fasting for at least 8 hours.   BUN 08/20/2022 5 (L)  6 - 20 mg/dL Final   Creatinine, Ser 08/20/2022 0.60  0.44 - 1.00 mg/dL Final   Calcium 08/20/2022 9.3  8.9 - 10.3 mg/dL Final   Total Protein 08/20/2022 6.8  6.5 - 8.1 g/dL Final   Albumin 08/20/2022 3.9  3.5 - 5.0 g/dL Final  AST 08/20/2022 16  15 - 41 U/L Final   ALT 08/20/2022 15  0 - 44 U/L Final   Alkaline Phosphatase 08/20/2022 91  38 - 126 U/L Final   Total Bilirubin 08/20/2022 0.2 (L)  0.3 - 1.2 mg/dL Final   GFR, Estimated 08/20/2022 >60  >60 mL/min Final   Comment: (NOTE) Calculated using the CKD-EPI Creatinine Equation (2021)    Anion gap 08/20/2022 11  5 - 15 Final   Performed at [facility], [address] [place], [place] [address]   Hgb A1c MFr Bld 08/20/2022 5.4  4.8 - 5.6 % Final   Comment: (NOTE) Pre diabetes:          5.7%-6.4%  Diabetes:              >6.4%  Glycemic control for   <7.0% adults with diabetes    Mean Plasma Glucose 08/20/2022 108.28  mg/dL Final   Performed at [facility], [address] [place], [place] [address]   Magnesium 08/20/2022 1.9  1.7 - 2.4 mg/dL Final   Performed at [facility], [address] [place], [place] [address]   Alcohol, Ethyl (B) 08/20/2022 <10  <10 mg/dL Final   Comment: (NOTE) Lowest  detectable limit for serum alcohol is 10 mg/dL.  For medical purposes only. Performed at [facility], [address] [place], [place] [address]    Cholesterol 08/20/2022 180  0 - 200 mg/dL Final   Triglycerides 46/96/295201/17/2024 181 (H)  <150 mg/dL Final   HDL 84/13/244001/17/2024 77  >40 mg/dL Final   Total CHOL/HDL Ratio 08/20/2022 2.3  RATIO Final   VLDL 08/20/2022 36  0 - 40 mg/dL Final   LDL Cholesterol 08/20/2022 67  0 - 99 mg/dL Final   Comment:        Total Cholesterol/HDL:CHD Risk Coronary Heart Disease Risk Table                     Men   Women  1/2 Average Risk   3.4   3.3  Average Risk       5.0   4.4  2 X Average Risk   9.6   7.1  3 X Average Risk  23.4   11.0        Use the calculated Patient Ratio above and the CHD Risk Table to determine the patient's CHD Risk.        ATP III CLASSIFICATION (LDL):  <100     mg/dL   Optimal  102-725100-129  mg/dL   Near or Above                    Optimal  130-159  mg/dL   Borderline  366-440160-189  mg/dL   High  >347>190     mg/dL   Very High Performed at [facility], [address] [place], [place] [address]    Color, Urine 08/20/2022 YELLOW  YELLOW Final   APPearance 08/20/2022 CLEAR  CLEAR Final   Specific Gravity, Urine 08/20/2022 1.020  1.005 - 1.030 Final   pH 08/20/2022 5.0  5.0 - 8.0 Final   Glucose, UA 08/20/2022 NEGATIVE  NEGATIVE mg/dL Final   Hgb urine dipstick 08/20/2022 NEGATIVE  NEGATIVE Final   Bilirubin Urine 08/20/2022 NEGATIVE  NEGATIVE Final   Ketones, ur 08/20/2022 NEGATIVE  NEGATIVE mg/dL Final   Protein, ur 63/87/564301/17/2024 NEGATIVE  NEGATIVE mg/dL Final   Nitrite 32/95/188401/17/2024 NEGATIVE  NEGATIVE Final   Leukocytes,Ua 08/20/2022 NEGATIVE  NEGATIVE Final   Performed at [facility], [address] [place], [place] [address]   Preg Test, Ur 08/20/2022 Negative  Negative Final   POC Amphetamine UR 08/20/2022 None Detected  NONE DETECTED (Cut Off Level 1000 ng/mL) Final   POC Secobarbital (BAR) 08/20/2022 None Detected   NONE DETECTED (Cut Off Level 300 ng/mL) Final   POC Buprenorphine (BUP) 08/20/2022 None Detected  NONE DETECTED (Cut Off Level 10 ng/mL) Final   POC Oxazepam (BZO) 08/20/2022 None Detected  NONE DETECTED (Cut Off Level 300 ng/mL) Final   POC Cocaine UR 08/20/2022 None Detected  NONE DETECTED (Cut Off Level 300 ng/mL) Final   POC Methamphetamine UR 08/20/2022 Positive (A)  NONE DETECTED (Cut Off Level 1000 ng/mL) Final   POC Morphine 08/20/2022 None Detected  NONE DETECTED (Cut Off Level 300 ng/mL) Final   POC Methadone UR 08/20/2022 None Detected  NONE DETECTED (Cut Off Level 300 ng/mL) Final   POC Oxycodone UR 08/20/2022 None Detected  NONE DETECTED (Cut Off Level 100 ng/mL) Final   POC Marijuana UR 08/20/2022 None Detected  NONE DETECTED (Cut Off Level 50 ng/mL) Final   HIV Screen 4th Generation wRfx 08/20/2022 Non Reactive  Non Reactive Final   Performed at [facility], [address] [place], [place] [address]   SARSCOV2ONAVIRUS 2 AG 08/20/2022 NEGATIVE  NEGATIVE Final   Comment: (NOTE) SARS-CoV-2 antigen NOT DETECTED.   Negative results are presumptive.  Negative results do not preclude SARS-CoV-2 infection and should not be used as the sole basis for treatment or other patient management decisions, including infection  control decisions, particularly in the presence of clinical signs and  symptoms consistent with COVID-19, or in those who have been in contact with the virus.  Negative results must be combined with clinical observations, patient history, and epidemiological information. The expected result is Negative.  Fact Sheet for Patients: https://www.jennings-kim.com/  Fact Sheet for Healthcare Providers: https://alexander-rogers.biz/  This test is not yet approved or cleared by the [place] FDA and  has been authorized for detection and/or diagnosis of SARS-CoV-2 by FDA under an Emergency Use Authorization (EUA).  This EUA will remain  in effect (meaning this test can be used) for the duration of  the COV                          ID-19 declaration under Section 564(b)(1) of the Act, 21 U.S.C. section 360bbb-3(b)(1), unless the authorization is terminated or revoked sooner.     Preg Test, Ur 08/20/2022 NEGATIVE  NEGATIVE Final   Comment:        THE SENSITIVITY OF THIS METHODOLOGY IS >24 mIU/mL   Admission on 07/22/2022, Discharged on 07/23/2022  Component Date Value Ref Range Status   SARS Coronavirus 2 by RT PCR 07/22/2022 NEGATIVE  NEGATIVE Final   Comment: (NOTE) SARS-CoV-2 target nucleic acids are NOT DETECTED.  The SARS-CoV-2 RNA is generally detectable in upper respiratory specimens during the acute phase of infection. The lowest concentration of SARS-CoV-2 viral copies this assay can detect is 138 copies/mL. A negative result does not preclude SARS-Cov-2 infection and should not be used as the sole basis for treatment or other patient management decisions. A negative result may occur with  improper specimen collection/handling, submission of specimen other than nasopharyngeal swab, presence of viral mutation(s) within the areas targeted by this assay, and inadequate number of viral copies(<138  copies/mL). A negative result must be combined with clinical observations, patient history, and epidemiological information. The expected result is Negative.  Fact Sheet for Patients:  BloggerCourse.com  Fact Sheet for Healthcare Providers:  SeriousBroker.it  This test is no                          t yet approved or cleared by the [place] FDA and  has been authorized for detection and/or diagnosis of SARS-CoV-2 by FDA under an Emergency Use Authorization (EUA). This EUA will remain  in effect (meaning this test can be used) for the duration of the COVID-19 declaration under Section 564(b)(1) of the Act, 21 U.S.C.section 360bbb-3(b)(1), unless the  authorization is terminated  or revoked sooner.       Influenza A by PCR 07/22/2022 NEGATIVE  NEGATIVE Final   Influenza B by PCR 07/22/2022 NEGATIVE  NEGATIVE Final   Comment: (NOTE) The Xpert Xpress SARS-CoV-2/FLU/RSV plus assay is intended as an aid in the diagnosis of influenza from Nasopharyngeal swab specimens and should not be used as a sole basis for treatment. Nasal washings and aspirates are unacceptable for Xpert Xpress SARS-CoV-2/FLU/RSV testing.  Fact Sheet for Patients: BloggerCourse.com  Fact Sheet for Healthcare Providers: SeriousBroker.it  This test is not yet approved or cleared by the [place] FDA and has been authorized for detection and/or diagnosis of SARS-CoV-2 by FDA under an Emergency Use Authorization (EUA). This EUA will remain in effect (meaning this test can be used) for the duration of the COVID-19 declaration under Section 564(b)(1) of the Act, 21 U.S.C. section 360bbb-3(b)(1), unless the authorization is terminated or revoked.     Resp Syncytial Virus by PCR 07/22/2022 NEGATIVE  NEGATIVE Final   Comment: (NOTE) Fact Sheet for Patients: BloggerCourse.com  Fact Sheet for Healthcare Providers: SeriousBroker.it  This test is not yet approved or cleared by the [place] FDA and has been authorized for detection and/or diagnosis of SARS-CoV-2 by FDA under an Emergency Use Authorization (EUA). This EUA will remain in effect (meaning this test can be used) for the duration of the COVID-19 declaration under Section 564(b)(1) of the Act, 21 U.S.C. section 360bbb-3(b)(1), unless the authorization is terminated or revoked.  Performed at [facility], [address], [place], [place] [address]    Sodium 07/22/2022 136  135 - 145 mmol/L Final   Potassium 07/22/2022 4.5  3.5 - 5.1 mmol/L Final   Chloride 07/22/2022 106  98 - 111 mmol/L  Final   CO2 07/22/2022 22  22 - 32 mmol/L Final   Glucose, Bld 07/22/2022 87  70 - 99 mg/dL Final   Glucose reference range applies only to samples taken after fasting for at least 8 hours.   BUN 07/22/2022 18  6 - 20 mg/dL Final   Creatinine, Ser 07/22/2022 0.61  0.44 - 1.00 mg/dL Final   Calcium 60/45/4098 9.3  8.9 - 10.3 mg/dL Final   Total Protein 11/91/4782 7.1  6.5 - 8.1 g/dL Final   Albumin 95/62/1308 4.0  3.5 - 5.0 g/dL Final   AST 65/78/4696 21  15 - 41 U/L Final   ALT 07/22/2022 18  0 - 44 U/L Final   Alkaline Phosphatase 07/22/2022 67  38 - 126 U/L Final   Total Bilirubin 07/22/2022 0.1 (L)  0.3 - 1.2 mg/dL Final   GFR, Estimated 07/22/2022 >60  >60 mL/min Final   Comment: (NOTE) Calculated using the CKD-EPI Creatinine Equation (2021)  Anion gap 07/22/2022 8  5 - 15 Final   Performed at [facility], [address] [place], [place] [address]   WBC 07/22/2022 7.2  4.0 - 10.5 K/uL Final   RBC 07/22/2022 5.27 (H)  3.87 - 5.11 MIL/uL Final   Hemoglobin 07/22/2022 15.8 (H)  12.0 - 15.0 g/dL Final   HCT 60/45/409812/19/2023 49.0 (H)  36.0 - 46.0 % Final   MCV 07/22/2022 93.0  80.0 - 100.0 fL Final   MCH 07/22/2022 30.0  26.0 - 34.0 pg Final   MCHC 07/22/2022 32.2  30.0 - 36.0 g/dL Final   RDW 11/91/478212/19/2023 13.7  11.5 - 15.5 % Final   Platelets 07/22/2022 276  150 - 400 K/uL Final   nRBC 07/22/2022 0.0  0.0 - 0.2 % Final   Performed at [facility], [address] [place], [place] [address]   I-stat hCG, quantitative 07/22/2022 <5.0  <5 mIU/mL Final   Comment 3 07/22/2022          Final   Comment:   GEST. AGE      CONC.  (mIU/mL)   <=1 WEEK        5 - 50     2 WEEKS       50 - 500     3 WEEKS       100 - 10,000     4 WEEKS     1,000 - 30,000        FEMALE AND NON-PREGNANT FEMALE:     LESS THAN 5 mIU/mL   Admission on 07/17/2022, Discharged on 07/22/2022  Component Date Value Ref Range Status   SARS Coronavirus 2 by RT PCR 07/17/2022 NEGATIVE  NEGATIVE Final   Comment:  (NOTE) SARS-CoV-2 target nucleic acids are NOT DETECTED.  The SARS-CoV-2 RNA is generally detectable in upper respiratory specimens during the acute phase of infection. The lowest concentration of SARS-CoV-2 viral copies this assay can detect is 138 copies/mL. A negative result does not preclude SARS-Cov-2 infection and should not be used as the sole basis for treatment or other patient management decisions. A negative result may occur with  improper specimen collection/handling, submission of specimen other than nasopharyngeal swab, presence of viral mutation(s) within the areas targeted by this assay, and inadequate number of viral copies(<138 copies/mL). A negative result must be combined with clinical observations, patient history, and epidemiological information. The expected result is Negative.  Fact Sheet for Patients:  BloggerCourse.comhttps://www.fda.gov/media/152166/download  Fact Sheet for Healthcare Providers:  SeriousBroker.ithttps://www.fda.gov/media/152162/download  This test is no                          t yet approved or cleared by the [place] FDA and  has been authorized for detection and/or diagnosis of SARS-CoV-2 by FDA under an Emergency Use Authorization (EUA). This EUA will remain  in effect (meaning this test can be used) for the duration of the COVID-19 declaration under Section 564(b)(1) of the Act, 21 U.S.C.section 360bbb-3(b)(1), unless the authorization is terminated  or revoked sooner.       Influenza A by PCR 07/17/2022 NEGATIVE  NEGATIVE Final   Influenza B by PCR 07/17/2022 NEGATIVE  NEGATIVE Final   Comment: (NOTE) The Xpert Xpress SARS-CoV-2/FLU/RSV plus assay is intended as an aid in the diagnosis of influenza from Nasopharyngeal swab specimens and should not be used as a sole basis for treatment. Nasal washings and aspirates are unacceptable for Xpert Xpress  SARS-CoV-2/FLU/RSV testing.  Fact Sheet for Patients: BloggerCourse.com  Fact  Sheet for Healthcare Providers: SeriousBroker.it  This test is not yet approved or cleared by the [place] FDA and has been authorized for detection and/or diagnosis of SARS-CoV-2 by FDA under an Emergency Use Authorization (EUA). This EUA will remain in effect (meaning this test can be used) for the duration of the COVID-19 declaration under Section 564(b)(1) of the Act, 21 U.S.C. section 360bbb-3(b)(1), unless the authorization is terminated or revoked.     Resp Syncytial Virus by PCR 07/17/2022 NEGATIVE  NEGATIVE Final   Comment: (NOTE) Fact Sheet for Patients: BloggerCourse.com  Fact Sheet for Healthcare Providers: SeriousBroker.it  This test is not yet approved or cleared by the [place] FDA and has been authorized for detection and/or diagnosis of SARS-CoV-2 by FDA under an Emergency Use Authorization (EUA). This EUA will remain in effect (meaning this test can be used) for the duration of the COVID-19 declaration under Section 564(b)(1) of the Act, 21 U.S.C. section 360bbb-3(b)(1), unless the authorization is terminated or revoked.  Performed at [facility], [address], [place], [place] [address]    WBC 07/17/2022 8.9  4.0 - 10.5 K/uL Final   RBC 07/17/2022 4.64  3.87 - 5.11 MIL/uL Final   Hemoglobin 07/17/2022 14.2  12.0 - 15.0 g/dL Final   HCT 91/47/8295 41.6  36.0 - 46.0 % Final   MCV 07/17/2022 89.7  80.0 - 100.0 fL Final   MCH 07/17/2022 30.6  26.0 - 34.0 pg Final   MCHC 07/17/2022 34.1  30.0 - 36.0 g/dL Final   RDW 62/13/0865 13.7  11.5 - 15.5 % Final   Platelets 07/17/2022 306  150 - 400 K/uL Final   nRBC 07/17/2022 0.0  0.0 - 0.2 % Final   Neutrophils Relative % 07/17/2022 59  % Final   Neutro Abs 07/17/2022 5.2  1.7 - 7.7 K/uL Final   Lymphocytes Relative 07/17/2022 35  % Final   Lymphs Abs 07/17/2022 3.1  0.7 - 4.0 K/uL Final   Monocytes Relative 07/17/2022 5  %  Final   Monocytes Absolute 07/17/2022 0.5  0.1 - 1.0 K/uL Final   Eosinophils Relative 07/17/2022 1  % Final   Eosinophils Absolute 07/17/2022 0.1  0.0 - 0.5 K/uL Final   Basophils Relative 07/17/2022 0  % Final   Basophils Absolute 07/17/2022 0.0  0.0 - 0.1 K/uL Final   Immature Granulocytes 07/17/2022 0  % Final   Abs Immature Granulocytes 07/17/2022 0.04  0.00 - 0.07 K/uL Final   Performed at [facility], [address], [place], [place] [address]   Sodium 07/17/2022 139  135 - 145 mmol/L Final   Potassium 07/17/2022 3.7  3.5 - 5.1 mmol/L Final   Chloride 07/17/2022 104  98 - 111 mmol/L Final   CO2 07/17/2022 24  22 - 32 mmol/L Final   Glucose, Bld 07/17/2022 51 (L)  70 - 99 mg/dL Final   Glucose reference range applies only to samples taken after fasting for at least 8 hours.   BUN 07/17/2022 8  6 - 20 mg/dL Final   Creatinine, Ser 07/17/2022 0.69  0.44 - 1.00 mg/dL Final   Calcium 62/95/2841 8.8 (L)  8.9 - 10.3 mg/dL Final   Total Protein 32/44/0102 6.2 (L)  6.5 - 8.1 g/dL Final   Albumin 72/53/6644 3.6  3.5 - 5.0 g/dL Final   AST 03/47/4259 23  15 - 41 U/L Final   ALT 07/17/2022 19  0 - 44 U/L Final   Alkaline Phosphatase 07/17/2022 82  38 - 126 U/L Final   Total Bilirubin 07/17/2022 0.6  0.3 - 1.2 mg/dL Final   GFR, Estimated 07/17/2022 >60  >60 mL/min Final   Comment: (NOTE) Calculated using the CKD-EPI Creatinine Equation (2021)    Anion gap 07/17/2022 11  5 - 15 Final   Performed at [facility], [address] [place], [place] [address]   Alcohol, Ethyl (B) 07/17/2022 37 (H)  <10 mg/dL Final   Comment: (NOTE) Lowest detectable limit for serum alcohol is 10 mg/dL.  For medical purposes only. Performed at [facility], [address] [place], [place] [address]    Neisseria Gonorrhea 07/17/2022 Negative   Final   Chlamydia 07/17/2022 Positive (A)   Final   Comment 07/17/2022 Normal Reference Ranger Chlamydia - Negative   Final   Comment 07/17/2022  Normal Reference Range Neisseria Gonorrhea - Negative   Final   POC Amphetamine UR 07/17/2022 None Detected  NONE DETECTED (Cut Off Level 1000 ng/mL) Final   POC Secobarbital (BAR) 07/17/2022 None Detected  NONE DETECTED (Cut Off Level 300 ng/mL) Final   POC Buprenorphine (BUP) 07/17/2022 None Detected  NONE DETECTED (Cut Off Level 10 ng/mL) Final   POC Oxazepam (BZO) 07/17/2022 Positive (A)  NONE DETECTED (Cut Off Level 300 ng/mL) Final   POC Cocaine UR 07/17/2022 None Detected  NONE DETECTED (Cut Off Level 300 ng/mL) Final   POC Methamphetamine UR 07/17/2022 None Detected  NONE DETECTED (Cut Off Level 1000 ng/mL) Final   POC Morphine 07/17/2022 None Detected  NONE DETECTED (Cut Off Level 300 ng/mL) Final   POC Methadone UR 07/17/2022 None Detected  NONE DETECTED (Cut Off Level 300 ng/mL) Final   POC Oxycodone UR 07/17/2022 None Detected  NONE DETECTED (Cut Off Level 100 ng/mL) Final   POC Marijuana UR 07/17/2022 None Detected  NONE DETECTED (Cut Off Level 50 ng/mL) Final   Preg Test, Ur 07/17/2022 Negative  Negative Final   RPR Ser Ql 07/17/2022 NON REACTIVE  NON REACTIVE Final   Performed at [facility], [address], [place], [place] [address]   HIV Screen 4th Generation wRfx 07/17/2022 Non Reactive  Non Reactive Final   Performed at [facility], [address], [place], [place] [address]   SARSCOV2ONAVIRUS 2 AG 07/17/2022 NEGATIVE  NEGATIVE Final   Comment: (NOTE) SARS-CoV-2 antigen NOT DETECTED.   Negative results are presumptive.  Negative results do not preclude SARS-CoV-2 infection and should not be used as the sole basis for treatment or other patient management decisions, including infection  control decisions, particularly in the presence of clinical signs and  symptoms consistent with COVID-19, or in those who have been in contact with the virus.  Negative results must be combined with clinical observations, patient history, and epidemiological information. The  expected result is Negative.  Fact Sheet for Patients: https://www.jennings-kim.com/  Fact Sheet for Healthcare Providers: https://alexander-rogers.biz/  This test is not yet approved or cleared by the [place] FDA and  has been authorized for detection and/or diagnosis of SARS-CoV-2 by FDA under an Emergency Use Authorization (EUA).  This EUA will remain in effect (meaning this test can be used) for the duration of  the COV                          ID-19 declaration under Section 564(b)(1) of the Act, 21 U.S.C.  section 360bbb-3(b)(1), unless the authorization is terminated or revoked sooner.     Preg Test, Ur 07/17/2022 NEGATIVE  NEGATIVE Final   Comment:        THE SENSITIVITY OF THIS METHODOLOGY IS >24 mIU/mL   Admission on 07/14/2022, Discharged on 07/14/2022  Component Date Value Ref Range Status   SARS Coronavirus 2 by RT PCR 07/14/2022 NEGATIVE  NEGATIVE Final   Comment: (NOTE) SARS-CoV-2 target nucleic acids are NOT DETECTED.  The SARS-CoV-2 RNA is generally detectable in upper respiratory specimens during the acute phase of infection. The lowest concentration of SARS-CoV-2 viral copies this assay can detect is 138 copies/mL. A negative result does not preclude SARS-Cov-2 infection and should not be used as the sole basis for treatment or other patient management decisions. A negative result may occur with  improper specimen collection/handling, submission of specimen other than nasopharyngeal swab, presence of viral mutation(s) within the areas targeted by this assay, and inadequate number of viral copies(<138 copies/mL). A negative result must be combined with clinical observations, patient history, and epidemiological information. The expected result is Negative.  Fact Sheet for Patients:  BloggerCourse.com  Fact Sheet for Healthcare Providers:  SeriousBroker.it  This test is no                           t yet approved or cleared by the [place] FDA and  has been authorized for detection and/or diagnosis of SARS-CoV-2 by FDA under an Emergency Use Authorization (EUA). This EUA will remain  in effect (meaning this test can be used) for the duration of the COVID-19 declaration under Section 564(b)(1) of the Act, 21 U.S.C.section 360bbb-3(b)(1), unless the authorization is terminated  or revoked sooner.       Influenza A by PCR 07/14/2022 NEGATIVE  NEGATIVE Final   Influenza B by PCR 07/14/2022 NEGATIVE  NEGATIVE Final   Comment: (NOTE) The Xpert Xpress SARS-CoV-2/FLU/RSV plus assay is intended as an aid in the diagnosis of influenza from Nasopharyngeal swab specimens and should not be used as a sole basis for treatment. Nasal washings and aspirates are unacceptable for Xpert Xpress SARS-CoV-2/FLU/RSV testing.  Fact Sheet for Patients: BloggerCourse.com  Fact Sheet for Healthcare Providers: SeriousBroker.it  This test is not yet approved or cleared by the [place] FDA and has been authorized for detection and/or diagnosis of SARS-CoV-2 by FDA under an Emergency Use Authorization (EUA). This EUA will remain in effect (meaning this test can be used) for the duration of the COVID-19 declaration under Section 564(b)(1) of the Act, 21 U.S.C. section 360bbb-3(b)(1), unless the authorization is terminated or revoked.  Performed at [facility], [address], [place], [place] [address]   Admission on 07/11/2022, Discharged on 07/11/2022  Component Date Value Ref Range Status   WBC 07/11/2022 9.7  4.0 - 10.5 K/uL Final   RBC 07/11/2022 5.26 (H)  3.87 - 5.11 MIL/uL Final   Hemoglobin 07/11/2022 15.9 (H)  12.0 - 15.0 g/dL Final   HCT 60/45/4098 48.3 (H)  36.0 - 46.0 % Final   MCV 07/11/2022 91.8  80.0 - 100.0 fL Final   MCH 07/11/2022 30.2  26.0 - 34.0 pg Final   MCHC 07/11/2022 32.9   30.0 - 36.0 g/dL Final   RDW 11/91/4782 13.5  11.5 - 15.5 % Final   Platelets 07/11/2022 282  150 - 400 K/uL Final   nRBC 07/11/2022 0.0  0.0 - 0.2 % Final   Neutrophils  Relative % 07/11/2022 63  % Final   Neutro Abs 07/11/2022 6.1  1.7 - 7.7 K/uL Final   Lymphocytes Relative 07/11/2022 29  % Final   Lymphs Abs 07/11/2022 2.8  0.7 - 4.0 K/uL Final   Monocytes Relative 07/11/2022 7  % Final   Monocytes Absolute 07/11/2022 0.7  0.1 - 1.0 K/uL Final   Eosinophils Relative 07/11/2022 1  % Final   Eosinophils Absolute 07/11/2022 0.1  0.0 - 0.5 K/uL Final   Basophils Relative 07/11/2022 0  % Final   Basophils Absolute 07/11/2022 0.0  0.0 - 0.1 K/uL Final   Immature Granulocytes 07/11/2022 0  % Final   Abs Immature Granulocytes 07/11/2022 0.03  0.00 - 0.07 K/uL Final   Performed at [facility], [address], [place], [place] [address]   Sodium 07/11/2022 139  135 - 145 mmol/L Final   Potassium 07/11/2022 3.8  3.5 - 5.1 mmol/L Final   Chloride 07/11/2022 105  98 - 111 mmol/L Final   CO2 07/11/2022 24  22 - 32 mmol/L Final   Glucose, Bld 07/11/2022 90  70 - 99 mg/dL Final   Glucose reference range applies only to samples taken after fasting for at least 8 hours.   BUN 07/11/2022 9  6 - 20 mg/dL Final   Creatinine, Ser 07/11/2022 0.57  0.44 - 1.00 mg/dL Final   Calcium 60/45/4098 8.5 (L)  8.9 - 10.3 mg/dL Final   Total Protein 11/91/4782 6.3 (L)  6.5 - 8.1 g/dL Final   Albumin 95/62/1308 3.6  3.5 - 5.0 g/dL Final   AST 65/78/4696 19  15 - 41 U/L Final   ALT 07/11/2022 16  0 - 44 U/L Final   Alkaline Phosphatase 07/11/2022 78  38 - 126 U/L Final   Total Bilirubin 07/11/2022 0.4  0.3 - 1.2 mg/dL Final   GFR, Estimated 07/11/2022 >60  >60 mL/min Final   Comment: (NOTE) Calculated using the CKD-EPI Creatinine Equation (2021)    Anion gap 07/11/2022 10  5 - 15 Final   Performed at [facility], [address], [place], [place] [address]   Troponin I (High Sensitivity) 07/11/2022  <2  <18 ng/L Final   Comment: (NOTE) Elevated high sensitivity troponin I (hsTnI) values and significant  changes across serial measurements may suggest ACS but many other  chronic and acute conditions are known to elevate hsTnI results.  Refer to the "Links" section for chest pain algorithms and additional  guidance. Performed at [facility], [address], [place], [place] [address]    Lipase 07/11/2022 35  11 - 51 U/L Final   Performed at [facility], [address] [place], [place] [address]   D-Dimer, Quant 07/11/2022 <0.27  0.00 - 0.50 ug/mL-FEU Final   Comment: (NOTE) At the manufacturer cut-off value of 0.5 g/mL FEU, this assay has a negative predictive value of 95-100%.This assay is intended for use in conjunction with a clinical pretest probability (PTP) assessment model to exclude pulmonary embolism (PE) and deep venous thrombosis (DVT) in outpatients suspected of PE or DVT. Results should be correlated with clinical presentation. Performed at [facility], [address], [place], [place] [address]    Color, Urine 07/11/2022 YELLOW  YELLOW Final   APPearance 07/11/2022 HAZY (A)  CLEAR Final   Specific Gravity, Urine 07/11/2022 1.024  1.005 - 1.030 Final   pH 07/11/2022 6.0  5.0 - 8.0 Final   Glucose, UA 07/11/2022  NEGATIVE  NEGATIVE mg/dL Final   Hgb urine dipstick 07/11/2022 NEGATIVE  NEGATIVE Final   Bilirubin Urine 07/11/2022 NEGATIVE  NEGATIVE Final   Ketones, ur 07/11/2022 NEGATIVE  NEGATIVE mg/dL Final   Protein, ur 91/47/8295 NEGATIVE  NEGATIVE mg/dL Final   Nitrite 62/13/0865 NEGATIVE  NEGATIVE Final   Leukocytes,Ua 07/11/2022 NEGATIVE  NEGATIVE Final   Performed at [facility], [address], [place], [place] [address]   SARS Coronavirus 2 by RT PCR 07/11/2022 NEGATIVE  NEGATIVE Final   Comment: (NOTE) SARS-CoV-2 target nucleic acids are NOT DETECTED.  The SARS-CoV-2 RNA is generally detectable in upper respiratory specimens  during the acute phase of infection. The lowest concentration of SARS-CoV-2 viral copies this assay can detect is 138 copies/mL. A negative result does not preclude SARS-Cov-2 infection and should not be used as the sole basis for treatment or other patient management decisions. A negative result may occur with  improper specimen collection/handling, submission of specimen other than nasopharyngeal swab, presence of viral mutation(s) within the areas targeted by this assay, and inadequate number of viral copies(<138 copies/mL). A negative result must be combined with clinical observations, patient history, and epidemiological information. The expected result is Negative.  Fact Sheet for Patients:  BloggerCourse.com  Fact Sheet for Healthcare Providers:  SeriousBroker.it  This test is no                          t yet approved or cleared by the [place] FDA and  has been authorized for detection and/or diagnosis of SARS-CoV-2 by FDA under an Emergency Use Authorization (EUA). This EUA will remain  in effect (meaning this test can be used) for the duration of the COVID-19 declaration under Section 564(b)(1) of the Act, 21 U.S.C.section 360bbb-3(b)(1), unless the authorization is terminated  or revoked sooner.       Influenza A by PCR 07/11/2022 NEGATIVE  NEGATIVE Final   Influenza B by PCR 07/11/2022 NEGATIVE  NEGATIVE Final   Comment: (NOTE) The Xpert Xpress SARS-CoV-2/FLU/RSV plus assay is intended as an aid in the diagnosis of influenza from Nasopharyngeal swab specimens and should not be used as a sole basis for treatment. Nasal washings and aspirates are unacceptable for Xpert Xpress SARS-CoV-2/FLU/RSV testing.  Fact Sheet for Patients: BloggerCourse.com  Fact Sheet for Healthcare Providers: SeriousBroker.it  This test is not yet approved or cleared by the [place] FDA and has been authorized for detection and/or diagnosis of SARS-CoV-2 by FDA under an Emergency Use Authorization (EUA). This EUA will remain in effect (meaning this test can be used) for the duration of the COVID-19 declaration under Section 564(b)(1) of the Act, 21 U.S.C. section 360bbb-3(b)(1), unless the authorization is terminated or revoked.  Performed at [facility], [address], [place], [place] [address]    I-stat hCG, quantitative 07/11/2022 <5.0  <5 mIU/mL Final   Comment 3 07/11/2022          Final   Comment:   GEST. AGE      CONC.  (mIU/mL)   <=1 WEEK        5 - 50     2 WEEKS       50 - 500     3 WEEKS       100 - 10,000     4 WEEKS     1,000 - 30,000        FEMALE AND NON-PREGNANT FEMALE:  LESS THAN 5 mIU/mL    Troponin I (High Sensitivity) 07/11/2022 2  <18 ng/L Final   Comment: (NOTE) Elevated high sensitivity troponin I (hsTnI) values and significant  changes across serial measurements may suggest ACS but many other  chronic and acute conditions are known to elevate hsTnI results.  Refer to the "Links" section for chest pain algorithms and additional  guidance. Performed at [facility], [address], [place], [place] [address]   Admission on 07/02/2022, Discharged on 07/02/2022  Component Date Value Ref Range Status   Color, Urine 07/02/2022 YELLOW  YELLOW Final   APPearance 07/02/2022 HAZY (A)  CLEAR Final   Specific Gravity, Urine 07/02/2022 1.014  1.005 - 1.030 Final   pH 07/02/2022 5.0  5.0 - 8.0 Final   Glucose, UA 07/02/2022 NEGATIVE  NEGATIVE mg/dL Final   Hgb urine dipstick 07/02/2022 SMALL (A)  NEGATIVE Final   Bilirubin Urine 07/02/2022 NEGATIVE  NEGATIVE Final   Ketones, ur 07/02/2022 NEGATIVE  NEGATIVE mg/dL Final   Protein, ur 60/45/4098 NEGATIVE  NEGATIVE mg/dL Final   Nitrite 11/91/4782 POSITIVE (A)  NEGATIVE Final   Leukocytes,Ua 07/02/2022 LARGE (A)  NEGATIVE Final   RBC / HPF 07/02/2022 6-10  0 - 5 RBC/hpf  Final   WBC, UA 07/02/2022 >50 (H)  0 - 5 WBC/hpf Final   Bacteria, UA 07/02/2022 FEW (A)  NONE SEEN Final   Squamous Epithelial / HPF 07/02/2022 6-10  0 - 5 Final   Mucus 07/02/2022 PRESENT   Final   Non Squamous Epithelial 07/02/2022 0-5 (A)  NONE SEEN Final   Performed at [facility], [address] [place], [place] [address]   WBC 07/02/2022 9.9  4.0 - 10.5 K/uL Final   RBC 07/02/2022 5.18 (H)  3.87 - 5.11 MIL/uL Final   Hemoglobin 07/02/2022 15.8 (H)  12.0 - 15.0 g/dL Final   HCT 30/86/5784 48.5 (H)  36.0 - 46.0 % Final   MCV 07/02/2022 93.6  80.0 - 100.0 fL Final   MCH 07/02/2022 30.5  26.0 - 34.0 pg Final   MCHC 07/02/2022 32.6  30.0 - 36.0 g/dL Final   RDW 69/62/9528 13.5  11.5 - 15.5 % Final   Platelets 07/02/2022 249  150 - 400 K/uL Final   nRBC 07/02/2022 0.0  0.0 - 0.2 % Final   Performed at [facility], [address] [place], [place] [address]   Sodium 07/02/2022 138  135 - 145 mmol/L Final   Potassium 07/02/2022 3.9  3.5 - 5.1 mmol/L Final   Chloride 07/02/2022 105  98 - 111 mmol/L Final   CO2 07/02/2022 26  22 - 32 mmol/L Final   Glucose, Bld 07/02/2022 114 (H)  70 - 99 mg/dL Final   Glucose reference range applies only to samples taken after fasting for at least 8 hours.   BUN 07/02/2022 9  6 - 20 mg/dL Final   Creatinine, Ser 07/02/2022 0.64  0.44 - 1.00 mg/dL Final   Calcium 40/05/2724 8.9  8.9 - 10.3 mg/dL Final   GFR, Estimated 07/02/2022 >60  >60 mL/min Final   Comment: (NOTE) Calculated using the CKD-EPI Creatinine Equation (2021)    Anion gap 07/02/2022 7  5 - 15 Final   Performed at [facility], [address], [place], [place] [address]   Lipase 07/02/2022 35  11 - 51 U/L Final   Performed at [facility], [address] [place], [place] [address]  Total Protein 07/02/2022 6.9  6.5 - 8.1 g/dL Final   Albumin 16/05/9603 3.8  3.5 - 5.0 g/dL Final   AST 54/04/8118 17  15 - 41  U/L Final   ALT 07/02/2022 16  0 - 44 U/L Final   Alkaline Phosphatase 07/02/2022 91  38 - 126 U/L Final   Total Bilirubin 07/02/2022 0.9  0.3 - 1.2 mg/dL Final   Bilirubin, Direct 07/02/2022 0.1  0.0 - 0.2 mg/dL Final   Indirect Bilirubin 07/02/2022 0.8  0.3 - 0.9 mg/dL Final   Performed at [facility], [address] [place], [place] [address]  Admission on 06/06/2022, Discharged on 06/06/2022  Component Date Value Ref Range Status   Sodium 06/06/2022 140  135 - 145 mmol/L Final   Potassium 06/06/2022 3.2 (L)  3.5 - 5.1 mmol/L Final   Chloride 06/06/2022 103  98 - 111 mmol/L Final   CO2 06/06/2022 26  22 - 32 mmol/L Final   Glucose, Bld 06/06/2022 97  70 - 99 mg/dL Final   Glucose reference range applies only to samples taken after fasting for at least 8 hours.   BUN 06/06/2022 6  6 - 20 mg/dL Final   Creatinine, Ser 06/06/2022 0.54  0.44 - 1.00 mg/dL Final   Calcium 95/62/1308 9.0  8.9 - 10.3 mg/dL Final   Total Protein 65/78/4696 6.5  6.5 - 8.1 g/dL Final   Albumin 29/52/8413 3.6  3.5 - 5.0 g/dL Final   AST 24/40/1027 20  15 - 41 U/L Final   ALT 06/06/2022 17  0 - 44 U/L Final   Alkaline Phosphatase 06/06/2022 111  38 - 126 U/L Final   Total Bilirubin 06/06/2022 0.3  0.3 - 1.2 mg/dL Final   GFR, Estimated 06/06/2022 >60  >60 mL/min Final   Comment: (NOTE) Calculated using the CKD-EPI Creatinine Equation (2021)    Anion gap 06/06/2022 11  5 - 15 Final   Performed at [facility], [address], [place], [place] [address]   Alcohol, Ethyl (B) 06/06/2022 18 (H)  <10 mg/dL Final   Comment: (NOTE) Lowest detectable limit for serum alcohol is 10 mg/dL.  For medical purposes only. Performed at [facility], [address], [place], [place] [address]    WBC 06/06/2022 6.0  4.0 - 10.5 K/uL Final   RBC 06/06/2022 4.76  3.87 - 5.11 MIL/uL Final   Hemoglobin 06/06/2022 14.5  12.0 - 15.0 g/dL Final   HCT 74/25/9563 43.9  36.0 - 46.0 % Final   MCV  06/06/2022 92.2  80.0 - 100.0 fL Final   MCH 06/06/2022 30.5  26.0 - 34.0 pg Final   MCHC 06/06/2022 33.0  30.0 - 36.0 g/dL Final   RDW 87/56/4332 13.5  11.5 - 15.5 % Final   Platelets 06/06/2022 224  150 - 400 K/uL Final   nRBC 06/06/2022 0.0  0.0 - 0.2 % Final   Performed at [facility], [address] [place], [place] [address]   I-stat hCG, quantitative 06/06/2022 <5.0  <5 mIU/mL Final   Comment 3 06/06/2022          Final   Comment:   GEST. AGE      CONC.  (mIU/mL)   <=1 WEEK        5 - 50     2 WEEKS       50 - 500     3 WEEKS       100 -  10,000     4 WEEKS     1,000 - 30,000        FEMALE AND NON-PREGNANT FEMALE:     LESS THAN 5 mIU/mL   Admission on 06/05/2022, Discharged on 06/05/2022  Component Date Value Ref Range Status   Sodium 06/05/2022 139  135 - 145 mmol/L Final   Potassium 06/05/2022 3.7  3.5 - 5.1 mmol/L Final   Chloride 06/05/2022 105  98 - 111 mmol/L Final   CO2 06/05/2022 26  22 - 32 mmol/L Final   Glucose, Bld 06/05/2022 114 (H)  70 - 99 mg/dL Final   Glucose reference range applies only to samples taken after fasting for at least 8 hours.   BUN 06/05/2022 8  6 - 20 mg/dL Final   Creatinine, Ser 06/05/2022 0.50  0.44 - 1.00 mg/dL Final   Calcium 23/76/2831 8.8 (L)  8.9 - 10.3 mg/dL Final   Total Protein 51/76/1607 7.1  6.5 - 8.1 g/dL Final   Albumin 37/05/6268 3.9  3.5 - 5.0 g/dL Final   AST 48/54/6270 20  15 - 41 U/L Final   ALT 06/05/2022 17  0 - 44 U/L Final   Alkaline Phosphatase 06/05/2022 120  38 - 126 U/L Final   Total Bilirubin 06/05/2022 0.5  0.3 - 1.2 mg/dL Final   GFR, Estimated 06/05/2022 >60  >60 mL/min Final   Comment: (NOTE) Calculated using the CKD-EPI Creatinine Equation (2021)    Anion gap 06/05/2022 8  5 - 15 Final   Performed at [facility], [address], [place], [place] [address]   WBC 06/05/2022 4.9  4.0 - 10.5 K/uL Final   RBC 06/05/2022 5.00  3.87 - 5.11 MIL/uL Final   Hemoglobin 06/05/2022 15.1 (H)   12.0 - 15.0 g/dL Final   HCT 38/18/2993 46.0  36.0 - 46.0 % Final   MCV 06/05/2022 92.0  80.0 - 100.0 fL Final   MCH 06/05/2022 30.2  26.0 - 34.0 pg Final   MCHC 06/05/2022 32.8  30.0 - 36.0 g/dL Final   RDW 71/69/6789 13.6  11.5 - 15.5 % Final   Platelets 06/05/2022 248  150 - 400 K/uL Final   nRBC 06/05/2022 0.0  0.0 - 0.2 % Final   Performed at [facility], [address], [place], [place] [address]   Opiates 06/05/2022 NONE DETECTED  NONE DETECTED Final   Cocaine 06/05/2022 NONE DETECTED  NONE DETECTED Final   Benzodiazepines 06/05/2022 NONE DETECTED  NONE DETECTED Final   Amphetamines 06/05/2022 POSITIVE (A)  NONE DETECTED Final   Tetrahydrocannabinol 06/05/2022 NONE DETECTED  NONE DETECTED Final   Barbiturates 06/05/2022 NONE DETECTED  NONE DETECTED Final   Comment: (NOTE) DRUG SCREEN FOR MEDICAL PURPOSES ONLY.  IF CONFIRMATION IS NEEDED FOR ANY PURPOSE, NOTIFY LAB WITHIN 5 DAYS.  LOWEST DETECTABLE LIMITS FOR URINE DRUG SCREEN Drug Class                     Cutoff (ng/mL) Amphetamine and metabolites    1000 Barbiturate and metabolites    200 Benzodiazepine                 200 Opiates and metabolites        300 Cocaine and metabolites        300 THC                            50 Performed  at [facility], [address] [place], [place] [address]    Alcohol, Ethyl (B) 06/05/2022 <10  <10 mg/dL Final   Comment: (NOTE) Lowest detectable limit for serum alcohol is 10 mg/dL.  For medical purposes only. Performed at [facility], [address] [place], [place] [address]   Admission on 05/10/2022, Discharged on 05/10/2022  Component Date Value Ref Range Status   Opiates 05/10/2022 NONE DETECTED  NONE DETECTED Final   Cocaine 05/10/2022 POSITIVE (A)  NONE DETECTED Final   Benzodiazepines 05/10/2022 NONE DETECTED  NONE DETECTED Final   Amphetamines 05/10/2022 NONE DETECTED  NONE DETECTED Final    Tetrahydrocannabinol 05/10/2022 NONE DETECTED  NONE DETECTED Final   Barbiturates 05/10/2022 NONE DETECTED  NONE DETECTED Final   Comment: (NOTE) DRUG SCREEN FOR MEDICAL PURPOSES ONLY.  IF CONFIRMATION IS NEEDED FOR ANY PURPOSE, NOTIFY LAB WITHIN 5 DAYS.  LOWEST DETECTABLE LIMITS FOR URINE DRUG SCREEN Drug Class                     Cutoff (ng/mL) Amphetamine and metabolites    1000 Barbiturate and metabolites    200 Benzodiazepine                 200 Tricyclics and metabolites     300 Opiates and metabolites        300 Cocaine and metabolites        300 THC                            50 Performed at [facility], [address] [place], [place] [address]    Alcohol, Ethyl (B) 05/10/2022 <10  <10 mg/dL Final   Comment: (NOTE) Lowest detectable limit for serum alcohol is 10 mg/dL.  For medical purposes only. Performed at [facility], [address] [place], [place] [address]    Sodium 05/10/2022 137  135 - 145 mmol/L Final   Potassium 05/10/2022 4.2  3.5 - 5.1 mmol/L Final   Chloride 05/10/2022 102  98 - 111 mmol/L Final   CO2 05/10/2022 29  22 - 32 mmol/L Final   Glucose, Bld 05/10/2022 90  70 - 99 mg/dL Final   Glucose reference range applies only to samples taken after fasting for at least 8 hours.   BUN 05/10/2022 11  6 - 20 mg/dL Final   Creatinine, Ser 05/10/2022 0.57  0.44 - 1.00 mg/dL Final   Calcium 27/25/366410/02/2022 8.7 (L)  8.9 - 10.3 mg/dL Final   Total Protein 40/34/742510/02/2022 7.2  6.5 - 8.1 g/dL Final   Albumin 95/63/875610/02/2022 3.7  3.5 - 5.0 g/dL Final   AST 43/32/951810/02/2022 18  15 - 41 U/L Final   ALT 05/10/2022 16  0 - 44 U/L Final   Alkaline Phosphatase 05/10/2022 97  38 - 126 U/L Final   Total Bilirubin 05/10/2022 0.5  0.3 - 1.2 mg/dL Final   GFR, Estimated 05/10/2022 >60  >60 mL/min Final   Comment: (NOTE) Calculated using the CKD-EPI Creatinine Equation (2021)    Anion gap 05/10/2022 6  5 - 15 Final   Performed at [facility], [address] [place], [place] [address]   WBC 05/10/2022 6.4  4.0 - 10.5 K/uL Final   RBC 05/10/2022 5.56 (H)  3.87 - 5.11 MIL/uL Final   Hemoglobin 05/10/2022 16.3 (H)  12.0 - 15.0 g/dL Final   HCT 06/30/160110/02/2022 50.7 (  H)  36.0 - 46.0 % Final   MCV 05/10/2022 91.2  80.0 - 100.0 fL Final   MCH 05/10/2022 29.3  26.0 - 34.0 pg Final   MCHC 05/10/2022 32.1  30.0 - 36.0 g/dL Final   RDW 83/15/1761 13.2  11.5 - 15.5 % Final   Platelets 05/10/2022 250  150 - 400 K/uL Final   nRBC 05/10/2022 0.0  0.0 - 0.2 % Final   Neutrophils Relative % 05/10/2022 49  % Final   Neutro Abs 05/10/2022 3.2  1.7 - 7.7 K/uL Final   Lymphocytes Relative 05/10/2022 40  % Final   Lymphs Abs 05/10/2022 2.6  0.7 - 4.0 K/uL Final   Monocytes Relative 05/10/2022 8  % Final   Monocytes Absolute 05/10/2022 0.5  0.1 - 1.0 K/uL Final   Eosinophils Relative 05/10/2022 1  % Final   Eosinophils Absolute 05/10/2022 0.0  0.0 - 0.5 K/uL Final   Basophils Relative 05/10/2022 1  % Final   Basophils Absolute 05/10/2022 0.0  0.0 - 0.1 K/uL Final   Immature Granulocytes 05/10/2022 1  % Final   Abs Immature Granulocytes 05/10/2022 0.04  0.00 - 0.07 K/uL Final   Performed at [facility], [address] [place], [place] [address]   Lipase 05/10/2022 34  11 - 51 U/L Final   Performed at [facility], [address], [place], [place] [address]  Admission on 05/08/2022, Discharged on 05/09/2022  Component Date Value Ref Range Status   SARS Coronavirus 2 by RT PCR 05/08/2022 NEGATIVE  NEGATIVE Final   Comment: (NOTE) SARS-CoV-2 target nucleic acids are NOT DETECTED.  The SARS-CoV-2 RNA is generally detectable in upper respiratory specimens during the acute phase of infection. The lowest concentration of SARS-CoV-2 viral copies this assay can detect is 138 copies/mL. A negative result does not preclude SARS-Cov-2 infection and should not be used as the sole basis for treatment  or other patient management decisions. A negative result may occur with  improper specimen collection/handling, submission of specimen other than nasopharyngeal swab, presence of viral mutation(s) within the areas targeted by this assay, and inadequate number of viral copies(<138 copies/mL). A negative result must be combined with clinical observations, patient history, and epidemiological information. The expected result is Negative.  Fact Sheet for Patients:  BloggerCourse.com  Fact Sheet for Healthcare Providers:  SeriousBroker.it  This test is no                          t yet approved or cleared by the [place] FDA and  has been authorized for detection and/or diagnosis of SARS-CoV-2 by FDA under an Emergency Use Authorization (EUA). This EUA will remain  in effect (meaning this test can be used) for the duration of the COVID-19 declaration under Section 564(b)(1) of the Act, 21 U.S.C.section 360bbb-3(b)(1), unless the authorization is terminated  or revoked sooner.       Influenza A by PCR 05/08/2022 NEGATIVE  NEGATIVE Final   Influenza B by PCR 05/08/2022 NEGATIVE  NEGATIVE Final   Comment: (NOTE) The Xpert Xpress SARS-CoV-2/FLU/RSV plus assay is intended as an aid in the diagnosis of influenza from Nasopharyngeal swab specimens and should not be used as a sole basis for treatment. Nasal washings and aspirates are unacceptable for Xpert Xpress SARS-CoV-2/FLU/RSV testing.  Fact Sheet for Patients: BloggerCourse.com  Fact Sheet for Healthcare Providers: SeriousBroker.it  This test is not yet approved or cleared by the [place]  FDA and has been authorized for detection and/or diagnosis of SARS-CoV-2 by FDA under an Emergency Use Authorization (EUA). This EUA will remain in effect (meaning this test can be used) for the duration of the COVID-19 declaration under  Section 564(b)(1) of the Act, 21 U.S.C. section 360bbb-3(b)(1), unless the authorization is terminated or revoked.  Performed at [facility], [place] [address] [place], [place] [address]    WBC 05/08/2022 6.5  4.0 - 10.5 K/uL Final   RBC 05/08/2022 5.36 (H)  3.87 - 5.11 MIL/uL Final   Hemoglobin 05/08/2022 16.1 (H)  12.0 - 15.0 g/dL Final   HCT 05/08/2022 47.9 (H)  36.0 - 46.0 % Final   MCV 05/08/2022 89.4  80.0 - 100.0 fL Final   MCH 05/08/2022 30.0  26.0 - 34.0 pg Final   MCHC 05/08/2022 33.6  30.0 - 36.0 g/dL Final   RDW 05/08/2022 13.2  11.5 - 15.5 % Final   Platelets 05/08/2022 237  150 - 400 K/uL Final   nRBC 05/08/2022 0.0  0.0 - 0.2 % Final   Neutrophils Relative % 05/08/2022 37  % Final   Neutro Abs 05/08/2022 2.4  1.7 - 7.7 K/uL Final   Lymphocytes Relative 05/08/2022 52  % Final   Lymphs Abs 05/08/2022 3.4  0.7 - 4.0 K/uL Final   Monocytes Relative 05/08/2022 8  % Final   Monocytes Absolute 05/08/2022 0.5  0.1 - 1.0 K/uL Final   Eosinophils Relative 05/08/2022 1  % Final   Eosinophils Absolute 05/08/2022 0.1  0.0 - 0.5 K/uL Final   Basophils Relative 05/08/2022 1  % Final   Basophils Absolute 05/08/2022 0.0  0.0 - 0.1 K/uL Final   Immature Granulocytes 05/08/2022 1  % Final   Abs Immature Granulocytes 05/08/2022 0.05  0.00 - 0.07 K/uL Final   Performed at [facility], [place] [address], [place], [place] [address]   Sodium 05/08/2022 135  135 - 145 mmol/L Final   Potassium 05/08/2022 3.9  3.5 - 5.1 mmol/L Final   Chloride 05/08/2022 100  98 - 111 mmol/L Final   CO2 05/08/2022 27  22 - 32 mmol/L Final   Glucose, Bld 05/08/2022 93  70 - 99 mg/dL Final   Glucose reference range applies only to samples taken after fasting for at least 8 hours.   BUN 05/08/2022 8  6 - 20 mg/dL Final   Creatinine, Ser 05/08/2022 0.68  0.44 - 1.00 mg/dL Final   Calcium 05/08/2022 9.0  8.9 - 10.3 mg/dL Final   Total Protein 05/08/2022 6.6  6.5 - 8.1 g/dL Final   Albumin 05/08/2022  3.7  3.5 - 5.0 g/dL Final   AST 05/08/2022 17  15 - 41 U/L Final   ALT 05/08/2022 17  0 - 44 U/L Final   Alkaline Phosphatase 05/08/2022 111  38 - 126 U/L Final   Total Bilirubin 05/08/2022 0.6  0.3 - 1.2 mg/dL Final   GFR, Estimated 05/08/2022 >60  >60 mL/min Final   Comment: (NOTE) Calculated using the CKD-EPI Creatinine Equation (2021)    Anion gap 05/08/2022 8  5 - 15 Final   Performed at [place] [address] [place], [place] [address]   Hgb A1c MFr Bld 05/08/2022 4.9  4.8 - 5.6 % Final   Comment: (NOTE) Pre diabetes:          5.7%-6.4%  Diabetes:              >6.4%  Glycemic  control for   <7.0% adults with diabetes    Mean Plasma Glucose 05/08/2022 93.93  mg/dL Final   Performed at [facility], [address] [place], [place] [address]   Preg Test, Ur 05/08/2022 NEGATIVE  NEGATIVE Final   Performed at [facility], [address], [place], [place] [address]   POC Amphetamine UR 05/08/2022 None Detected  NONE DETECTED (Cut Off Level 1000 ng/mL) Final   POC Secobarbital (BAR) 05/08/2022 None Detected  NONE DETECTED (Cut Off Level 300 ng/mL) Final   POC Buprenorphine (BUP) 05/08/2022 None Detected  NONE DETECTED (Cut Off Level 10 ng/mL) Final   POC Oxazepam (BZO) 05/08/2022 None Detected  NONE DETECTED (Cut Off Level 300 ng/mL) Final   POC Cocaine UR 05/08/2022 None Detected  NONE DETECTED (Cut Off Level 300 ng/mL) Final   POC Methamphetamine UR 05/08/2022 Positive (A)  NONE DETECTED (Cut Off Level 1000 ng/mL) Final   POC Morphine 05/08/2022 None Detected  NONE DETECTED (Cut Off Level 300 ng/mL) Final   POC Methadone UR 05/08/2022 None Detected  NONE DETECTED (Cut Off Level 300 ng/mL) Final   POC Oxycodone UR 05/08/2022 None Detected  NONE DETECTED (Cut Off Level 100 ng/mL) Final   POC Marijuana UR 05/08/2022 None Detected  NONE DETECTED (Cut Off Level 50 ng/mL) Final   SARSCOV2ONAVIRUS 2 AG 05/08/2022 NEGATIVE  NEGATIVE Final   Comment:  (NOTE) SARS-CoV-2 antigen NOT DETECTED.   Negative results are presumptive.  Negative results do not preclude SARS-CoV-2 infection and should not be used as the sole basis for treatment or other patient management decisions, including infection  control decisions, particularly in the presence of clinical signs and  symptoms consistent with COVID-19, or in those who have been in contact with the virus.  Negative results must be combined with clinical observations, patient history, and epidemiological information. The expected result is Negative.  Fact Sheet for Patients: https://www.jennings-kim.com/  Fact Sheet for Healthcare Providers: https://alexander-rogers.biz/  This test is not yet approved or cleared by the [place] FDA and  has been authorized for detection and/or diagnosis of SARS-CoV-2 by FDA under an Emergency Use Authorization (EUA).  This EUA will remain in effect (meaning this test can be used) for the duration of  the COV                          ID-19 declaration under Section 564(b)(1) of the Act, 21 U.S.C. section 360bbb-3(b)(1), unless the authorization is terminated or revoked sooner.     Preg Test, Ur 05/08/2022 NEGATIVE  NEGATIVE Final   Comment:        THE SENSITIVITY OF THIS METHODOLOGY IS >24 mIU/mL    Cholesterol 05/08/2022 187  0 - 200 mg/dL Final   Triglycerides 09/81/1914 117  <150 mg/dL Final   HDL 78/29/5621 67  >40 mg/dL Final   Total CHOL/HDL Ratio 05/08/2022 2.8  RATIO Final   VLDL 05/08/2022 23  0 - 40 mg/dL Final   LDL Cholesterol 05/08/2022 97  0 - 99 mg/dL Final   Comment:        Total Cholesterol/HDL:CHD Risk Coronary Heart Disease Risk Table                     Men   Women  1/2 Average Risk   3.4   3.3  Average Risk       5.0   4.4  2 X Average Risk  9.6   7.1  3 X Average Risk  23.4   11.0        Use the calculated Patient Ratio above and the CHD Risk Table to determine the patient's CHD Risk.         ATP III CLASSIFICATION (LDL):  <100     mg/dL   Optimal  130-865  mg/dL   Near or Above                    Optimal  130-159  mg/dL   Borderline  784-696  mg/dL   High  >295     mg/dL   Very High Performed at [facility], [address], [place], [place] [address]    TSH 05/08/2022 3.264  0.350 - 4.500 uIU/mL Final   Comment: Performed by a 3rd Generation assay with a functional sensitivity of <=0.01 uIU/mL. Performed at [facility], [address] [place], [place] [address]   There may be more visits with results that are not included.    Blood Alcohol level:  Lab Results  Component Value Date   ETH <10 08/20/2022   ETH 37 (H) 07/17/2022    Metabolic Disorder Labs: Lab Results  Component Value Date   HGBA1C 5.4 08/20/2022   MPG 108.28 08/20/2022   MPG 93.93 05/08/2022   No results found for: "PROLACTIN" Lab Results  Component Value Date   CHOL 180 08/20/2022   TRIG 181 (H) 08/20/2022   HDL 77 08/20/2022   CHOLHDL 2.3 08/20/2022   VLDL 36 08/20/2022   LDLCALC 67 08/20/2022   LDLCALC 97 05/08/2022    Therapeutic Lab Levels: No results found for: "LITHIUM" No results found for: "VALPROATE" No results found for: "CBMZ"  Physical Findings   AIMS    Flowsheet Row ED to [facility]osp[facility] ([facility]  AIMS Total Score 0      AUDIT    Flowsheet Row ED to Hosp-Admission (Discharged[facility]  Alcohol Use Disorder Identification Test Final Score (AUDIT) 32      CAGE-AID    Flowsheet Row ED from 01/17/2022 in [facility]  CAGE-AID Score 1      GAD-7    Flowsheet Row Office Visit from 03/12/2021 in [facility]  Total GAD-7 Score 20      PHQ2-9    Flowsheet Row ED from 08/20/2022 in [facility] ED from 07/17/2022 in [facility] ED from 05/08/2022 in [facility] Office Visit from 03/12/2021 in [facility]  PHQ-2 Total Score 6 6 2 5   PHQ-9 Total Score 22 22 11 22       Flowsheet Row ED from 08/20/2022 in [facility] ED from 07/23/2022 in [facility] ED from 07/22/2022 in [facility] EMERGENCY DEPARTMENT  C-SSRS RISK CATEGORY No Risk Low Risk No Risk      Psychiatric Specialty Exam: Physical Exam Constitutional:      Appearance: the patient is not toxic-appearing.  Pulmonary:     Effort: Pulmonary effort is normal.  Neurological:     General: No focal deficit present.     Mental Status: the patient is alert and oriented to person, place, and time.   Review of Systems  Respiratory:  Negative for shortness of breath.   Cardiovascular:  Negative for chest pain.  Gastrointestinal:  Negative for abdominal pain, constipation, diarrhea, nausea and vomiting.  Neurological:  Negative for headaches.      BP 114/72   Pulse 85   Temp 98.5 F (36.9 C) (Oral)   Resp 18   Ht 5\' 3"  (1.6 m)   Wt 110 lb (49.9 kg)   SpO2 100%   BMI 19.49 kg/m   General Appearance: disheveled  Eye Contact:  Good  Speech:  Clear and Coherent  Volume:  Normal  Mood:  Euthymic  Affect:  Congruent  Thought Process:  Coherent  Orientation:  Full (Time, Place, and Person)  Thought Content: Logical   Suicidal Thoughts:  No  Homicidal Thoughts:  No  Memory:  Immediate;   Good  Judgement:  fair  Insight:  fair  Psychomotor Activity:  Normal  Concentration:  Concentration: Good  Recall:  Good  Fund of Knowledge: Good  Language: Good  Akathisia:  No  Handed:    AIMS (if indicated): not done  Assets:  Communication Skills Desire for Improvement Leisure Time Physical Health  ADL's:  Intact  Cognition: WNL  Sleep:  Fair     Treatment Plan Summary: Daily contact with patient to assess and evaluate symptoms and  progress in treatment and Medication management  Status: Voluntary, no acute safety concerns  Methamphetamine use disorder, alcohol use disorder - Symptomatic management of withdrawal symptoms from methamphetamine - Librium taper due to concern for alcohol withdrawal - Pursue residential rehab placement  Continue home medications as ordered: - Abilify 10 mg daily - Lexapro 10 mg daily - 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

## 2022-08-21 NOTE — Progress Notes (Signed)
Patient continues to be in bed resting.  Patient appears demoralized, dysphoric and sad.  She is disheveled.  Patient crashing from recent drug use.  Patient was given medication as ordered along with 650mg  tylenol for headache.  Patient declined to attend group meeting.  Will monitor and provide supportive environment.

## 2022-08-21 NOTE — ED Notes (Signed)
In a group meeting 

## 2022-08-21 NOTE — ED Notes (Signed)
Patient has remained in bed today.  She complained of feeling badly with stuffed nose and vague cold symptoms.  Patient met with MD briefly earlier and then returned to room and bed.  Patient is disheveled and reports feeling "exhausted."  She was given water at bedside and encouraged to increase PO fluid.  Patient is calm and cooperative with care and meds.  Will monitor and provide safe supportive environment.

## 2022-08-22 DIAGNOSIS — Z1152 Encounter for screening for COVID-19: Secondary | ICD-10-CM | POA: Diagnosis not present

## 2022-08-22 DIAGNOSIS — F1094 Alcohol use, unspecified with alcohol-induced mood disorder: Secondary | ICD-10-CM | POA: Diagnosis not present

## 2022-08-22 DIAGNOSIS — Z5900 Homelessness unspecified: Secondary | ICD-10-CM | POA: Diagnosis not present

## 2022-08-22 DIAGNOSIS — F1910 Other psychoactive substance abuse, uncomplicated: Secondary | ICD-10-CM | POA: Diagnosis not present

## 2022-08-22 LAB — RESP PANEL BY RT-PCR (RSV, FLU A&B, COVID)  RVPGX2
Influenza A by PCR: NEGATIVE
Influenza B by PCR: NEGATIVE
Resp Syncytial Virus by PCR: NEGATIVE
SARS Coronavirus 2 by RT PCR: NEGATIVE

## 2022-08-22 NOTE — ED Provider Notes (Signed)
Behavioral Health Progress Note  Date and Time: 08/22/2022 1:44 PM Name: Kristina Owens MRN:  [id number]  Subjective:   The patient is a 57 year old female well-known to this service line.  She has a history of cocaine, alcohol, and methamphetamine use disorders.  She has been homeless for some time.  She has had many stays at the [facility] facility base crisis.  The last 1 was in December, when she was discharged to [facility] residential.  She reported to LCSW that she was denied because she did not have her Xarelto.  On initial interview the patient appeared to have physical symptoms, possibly of methamphetamine withdrawal (the only substance for which she was positive in her UDS).  Started on Librium taper due to concern for alcohol withdrawal.  Patient has expressed a desire for residential rehab at [facility].  Referral has been made.  Patient given resources for long-term residential rehab.  Patient is supposed to make phone calls.  Estimated discharge date of Tuesday discussed with the patient.  Patient reports symptoms of headache, nausea, and tremor, consistent with alcohol withdrawal syndrome.  She also reports feeling like she has a respiratory virus.  Plan for respiratory panel.  The patient denies auditory/visual hallucinations and first rank symptoms. They deny suicidal and homicidal thoughts. The patient denies side effects from their medications.  Review of systems as below.    Diagnosis:  Final diagnoses:  Alcohol use disorder, severe, dependence (HCC)  Alcohol-induced mood disorder with depressive symptoms (HCC)  Polysubstance abuse (HCC)  Homelessness    Total Time spent with patient: 20 minutes  Past Psychiatric History: as above Past Medical History:  Past Medical History:  Diagnosis Date   Anxiety    Depression    DVT (deep vein thrombosis) in pregnancy    GERD (gastroesophageal reflux disease)    Ovarian cyst     Past Surgical History:  Procedure  Laterality Date   APPENDECTOMY  age 29   IR PTA VENOUS EXCEPT DIALYSIS CIRCUIT  01/16/2021   IR RADIOLOGIST EVAL & MGMT  03/19/2021   IR THROMBECT VENO MECH MOD SED  01/16/2021   IR US GUIDE VASC ACCESS LEFT  01/16/2021   IR VENO/EXT/UNI LEFT  01/16/2021   IR VENOCAVAGRAM IVC  01/16/2021   TONSILLECTOMY Bilateral age 9   Family History:  Family History  Problem Relation Age of Onset   Asthma Mother    COPD Mother    Cancer Father        thyroid cancer   Family Psychiatric  History: none Social History:  Social History   Substance and Sexual Activity  Alcohol Use Yes     Social History   Substance and Sexual Activity  Drug Use Yes   Types: "Crack" cocaine, Cocaine, Methamphetamines   Comment: last used 4/31/22    Social History   Socioeconomic History   Marital status: Divorced    Spouse name: Not on file   Number of children: Not on file   Years of education: Not on file   Highest education level: Not on file  Occupational History   Not on file  Tobacco Use   Smoking status: Every Day    Packs/day: 0.50    Years: 35.00    Total pack years: 17.50    Types: Cigarettes   Smokeless tobacco: Never  Vaping Use   Vaping Use: Never used  Substance and Sexual Activity   Alcohol use: Yes   Drug use: Yes    Types: "Crack" cocaine,  Cocaine, Methamphetamines    Comment: last used 4/31/22   Sexual activity: Not on file  Other Topics Concern   Not on file  Social History Narrative   Not on file   Social Determinants of Health   Financial Resource Strain: Not on file  Food Insecurity: Not on file  Transportation Needs: Not on file  Physical Activity: Not on file  Stress: Not on file  Social Connections: Not on file   SDOH:  SDOH Screenings   Alcohol Screen: High Risk (03/10/2022)  Depression (PHQ2-9): High Risk (08/20/2022)  Tobacco Use: High Risk (08/20/2022)   Additional Social History:    Pain Medications: See MAR Prescriptions: See MAR Over the Counter:  See MAR History of alcohol / drug use?: Yes Longest period of sobriety (when/how long): 2 months Negative Consequences of Use: Legal Withdrawal Symptoms: Nausea / Vomiting, Tremors, Fever / Chills, Sweats, Other (Comment) (Headache) Name of Substance 1: Alcohol 1 - Age of First Use: 16 1 - Amount (size/oz): 1 pint 1 - Frequency: daily 1 - Duration: ongoing 1 - Last Use / Amount: 2 hours ago 1 - Method of Aquiring: purchase 1- Route of Use: drinking Name of Substance 2: Methamphetamine 2 - Age of First Use: 47 2 - Amount (size/oz): $20 worth 2 - Frequency: Weekly 2 - Duration: Ongoing 2 - Last Use / Amount: 5 days ago 2 - Method of Aquiring: UTA 2 - Route of Substance Use: intervenous                Sleep: Fair  Appetite:  Fair  Current Medications:  Current Facility-Administered Medications  Medication Dose Route Frequency Provider Last Rate Last Admin   acetaminophen (TYLENOL) tablet 650 mg  650 mg Oral Q6H PRN Rankin, Shuvon B, NP   650 mg at 08/22/22 0920   alum & mag hydroxide-simeth (MAALOX/MYLANTA) 200-200-20 MG/5ML suspension 30 mL  30 mL Oral Q4H PRN Rankin, Shuvon B, NP       ARIPiprazole (ABILIFY) tablet 10 mg  10 mg Oral Daily Rankin, Shuvon B, NP   10 mg at 08/22/22 0916   chlordiazePOXIDE (LIBRIUM) capsule 25 mg  25 mg Oral Q6H PRN Rankin, Shuvon B, NP       chlordiazePOXIDE (LIBRIUM) capsule 25 mg  25 mg Oral TID Rankin, Shuvon B, NP   25 mg at 08/22/22 0916   Followed by   Melene Muller ON 08/23/2022] chlordiazePOXIDE (LIBRIUM) capsule 25 mg  25 mg Oral BH-qamhs Rankin, Shuvon B, NP       Followed by   Melene Muller ON 08/24/2022] chlordiazePOXIDE (LIBRIUM) capsule 25 mg  25 mg Oral Daily Rankin, Shuvon B, NP       escitalopram (LEXAPRO) tablet 10 mg  10 mg Oral Daily Rankin, Shuvon B, NP   10 mg at 08/22/22 0916   fluticasone (FLONASE) 50 MCG/ACT nasal spray 1 spray  1 spray Each Nare Daily Carlyn Reichert, MD   1 spray at 08/22/22 0919   hydrOXYzine (ATARAX) tablet 25  mg  25 mg Oral Q6H PRN Rankin, Shuvon B, NP   25 mg at 08/21/22 2127   loperamide (IMODIUM) capsule 2-4 mg  2-4 mg Oral PRN Carlyn Reichert, MD       magnesium hydroxide (MILK OF MAGNESIA) suspension 30 mL  30 mL Oral Daily PRN Rankin, Shuvon B, NP       multivitamin with minerals tablet 1 tablet  1 tablet Oral Daily Rankin, Shuvon B, NP   1 tablet at 08/22/22 [id number]  nicotine (NICODERM CQ - dosed in mg/24 hours) patch 21 mg  21 mg Transdermal Daily Rankin, Shuvon B, NP   21 mg at 08/22/22 0930   ondansetron (ZOFRAN-ODT) disintegrating tablet 4 mg  4 mg Oral Q6H PRN Rankin, Shuvon B, NP   4 mg at 08/21/22 2127   rivaroxaban (XARELTO) tablet 20 mg  20 mg Oral Daily Rankin, Shuvon B, NP   20 mg at 08/22/22 0930   thiamine (VITAMIN B1) tablet 100 mg  100 mg Oral Daily Carlyn Reichert, MD   100 mg at 08/22/22 0916   traZODone (DESYREL) tablet 50 mg  50 mg Oral QHS PRN Rankin, Shuvon B, NP   50 mg at 08/21/22 2126   Current Outpatient Medications  Medication Sig Dispense Refill   ARIPiprazole (ABILIFY) 10 MG tablet Take 1 tablet (10 mg total) by mouth daily. 30 tablet 0   escitalopram (LEXAPRO) 10 MG tablet Take 1 tablet (10 mg total) by mouth daily. 30 tablet 0   hydrOXYzine (ATARAX) 25 MG tablet Take 1 tablet (25 mg total) by mouth 3 (three) times daily as needed for anxiety. 30 tablet 0   RIVAROXABAN (XARELTO) VTE STARTER PACK (15 & 20 MG) Follow package directions: Take one 15mg  tablet by mouth twice a day. On day 22, switch to one 20mg  tablet once a day. Take with food. (Patient taking differently: Take 15-20 mg by mouth See admin instructions. Follow package directions: Take one 15mg  tablet by mouth twice a day. On day 22, switch to one 20mg  tablet once a day. Take with food.) 51 each 0    Labs  Lab Results:  Admission on 08/20/2022  Component Date Value Ref Range Status   SARS Coronavirus 2 by RT PCR 08/20/2022 NEGATIVE  NEGATIVE Final   Comment: (NOTE) SARS-CoV-2 target nucleic acids are  NOT DETECTED.  The SARS-CoV-2 RNA is generally detectable in upper respiratory specimens during the acute phase of infection. The lowest concentration of SARS-CoV-2 viral copies this assay can detect is 138 copies/mL. A negative result does not preclude SARS-Cov-2 infection and should not be used as the sole basis for treatment or other patient management decisions. A negative result may occur with  improper specimen collection/handling, submission of specimen other than nasopharyngeal swab, presence of viral mutation(s) within the areas targeted by this assay, and inadequate number of viral copies(<138 copies/mL). A negative result must be combined with clinical observations, patient history, and epidemiological information. The expected result is Negative.  Fact Sheet for Patients:  BloggerCourse.com  Fact Sheet for Healthcare Providers:  SeriousBroker.it  This test is no                          t yet approved or cleared by the [place] FDA and  has been authorized for detection and/or diagnosis of SARS-CoV-2 by FDA under an Emergency Use Authorization (EUA). This EUA will remain  in effect (meaning this test can be used) for the duration of the COVID-19 declaration under Section 564(b)(1) of the Act, 21 U.S.C.section 360bbb-3(b)(1), unless the authorization is terminated  or revoked sooner.       Influenza A by PCR 08/20/2022 NEGATIVE  NEGATIVE Final   Influenza B by PCR 08/20/2022 NEGATIVE  NEGATIVE Final   Comment: (NOTE) The Xpert Xpress SARS-CoV-2/FLU/RSV plus assay is intended as an aid in the diagnosis of influenza from Nasopharyngeal swab specimens and should not be used as a sole basis for treatment. Nasal washings  and aspirates are unacceptable for Xpert Xpress SARS-CoV-2/FLU/RSV testing.  Fact Sheet for Patients: BloggerCourse.com  Fact Sheet for Healthcare  Providers: SeriousBroker.it  This test is not yet approved or cleared by the [place] FDA and has been authorized for detection and/or diagnosis of SARS-CoV-2 by FDA under an Emergency Use Authorization (EUA). This EUA will remain in effect (meaning this test can be used) for the duration of the COVID-19 declaration under Section 564(b)(1) of the Act, 21 U.S.C. section 360bbb-3(b)(1), unless the authorization is terminated or revoked.     Resp Syncytial Virus by PCR 08/20/2022 NEGATIVE  NEGATIVE Final   Comment: (NOTE) Fact Sheet for Patients: BloggerCourse.com  Fact Sheet for Healthcare Providers: SeriousBroker.it  This test is not yet approved or cleared by the [place] FDA and has been authorized for detection and/or diagnosis of SARS-CoV-2 by FDA under an Emergency Use Authorization (EUA). This EUA will remain in effect (meaning this test can be used) for the duration of the COVID-19 declaration under Section 564(b)(1) of the Act, 21 U.S.C. section 360bbb-3(b)(1), unless the authorization is terminated or revoked.  Performed at [facility], [address], [place], [place] [address]    WBC 08/20/2022 5.9  4.0 - 10.5 K/uL Final   RBC 08/20/2022 4.89  3.87 - 5.11 MIL/uL Final   Hemoglobin 08/20/2022 15.2 (H)  12.0 - 15.0 g/dL Final   HCT 60/45/4098 44.4  36.0 - 46.0 % Final   MCV 08/20/2022 90.8  80.0 - 100.0 fL Final   MCH 08/20/2022 31.1  26.0 - 34.0 pg Final   MCHC 08/20/2022 34.2  30.0 - 36.0 g/dL Final   RDW 11/91/4782 13.5  11.5 - 15.5 % Final   Platelets 08/20/2022 294  150 - 400 K/uL Final   nRBC 08/20/2022 0.0  0.0 - 0.2 % Final   Neutrophils Relative % 08/20/2022 54  % Final   Neutro Abs 08/20/2022 3.2  1.7 - 7.7 K/uL Final   Lymphocytes Relative 08/20/2022 34  % Final   Lymphs Abs 08/20/2022 2.0  0.7 - 4.0 K/uL Final   Monocytes Relative 08/20/2022 8  % Final    Monocytes Absolute 08/20/2022 0.5  0.1 - 1.0 K/uL Final   Eosinophils Relative 08/20/2022 2  % Final   Eosinophils Absolute 08/20/2022 0.1  0.0 - 0.5 K/uL Final   Basophils Relative 08/20/2022 1  % Final   Basophils Absolute 08/20/2022 0.1  0.0 - 0.1 K/uL Final   Immature Granulocytes 08/20/2022 1  % Final   Abs Immature Granulocytes 08/20/2022 0.04  0.00 - 0.07 K/uL Final   Performed at [facility], [address] [place], [place] [address]   Sodium 08/20/2022 140  135 - 145 mmol/L Final   Potassium 08/20/2022 3.9  3.5 - 5.1 mmol/L Final   Chloride 08/20/2022 105  98 - 111 mmol/L Final   CO2 08/20/2022 24  22 - 32 mmol/L Final   Glucose, Bld 08/20/2022 84  70 - 99 mg/dL Final   Glucose reference range applies only to samples taken after fasting for at least 8 hours.   BUN 08/20/2022 5 (L)  6 - 20 mg/dL Final   Creatinine, Ser 08/20/2022 0.60  0.44 - 1.00 mg/dL Final   Calcium 30/86/5784 9.3  8.9 - 10.3 mg/dL Final   Total Protein 69/62/9528 6.8  6.5 - 8.1 g/dL Final   Albumin 41/32/4401 3.9  3.5 - 5.0 g/dL Final   AST 02/72/5366 16  15 - 41 U/L  Final   ALT 08/20/2022 15  0 - 44 U/L Final   Alkaline Phosphatase 08/20/2022 91  38 - 126 U/L Final   Total Bilirubin 08/20/2022 0.2 (L)  0.3 - 1.2 mg/dL Final   GFR, Estimated 08/20/2022 >60  >60 mL/min Final   Comment: (NOTE) Calculated using the CKD-EPI Creatinine Equation (2021)    Anion gap 08/20/2022 11  5 - 15 Final   Performed at [facility], [address], [place], [place] [address]   Hgb A1c MFr Bld 08/20/2022 5.4  4.8 - 5.6 % Final   Comment: (NOTE) Pre diabetes:          5.7%-6.4%  Diabetes:              >6.4%  Glycemic control for   <7.0% adults with diabetes    Mean Plasma Glucose 08/20/2022 108.28  mg/dL Final   Performed at [facility], [address], [place], [place] [address]   Magnesium 08/20/2022 1.9  1.7 - 2.4 mg/dL Final   Performed at [facility], [address], [place],  [place] [address]   Alcohol, Ethyl (B) 08/20/2022 <10  <10 mg/dL Final   Comment: (NOTE) Lowest detectable limit for serum alcohol is 10 mg/dL.  For medical purposes only. Performed at [facility], [address], [place], [place] [address]    Cholesterol 08/20/2022 180  0 - 200 mg/dL Final   Triglycerides 84/13/2440 181 (H)  <150 mg/dL Final   HDL 05/31/2535 77  >40 mg/dL Final   Total CHOL/HDL Ratio 08/20/2022 2.3  RATIO Final   VLDL 08/20/2022 36  0 - 40 mg/dL Final   LDL Cholesterol 08/20/2022 67  0 - 99 mg/dL Final   Comment:        Total Cholesterol/HDL:CHD Risk Coronary Heart Disease Risk Table                     Men   Women  1/2 Average Risk   3.4   3.3  Average Risk       5.0   4.4  2 X Average Risk   9.6   7.1  3 X Average Risk  23.4   11.0        Use the calculated Patient Ratio above and the CHD Risk Table to determine the patient's CHD Risk.        ATP III CLASSIFICATION (LDL):  <100     mg/dL   Optimal  644-034  mg/dL   Near or Above                    Optimal  130-159  mg/dL   Borderline  742-595  mg/dL   High  >638     mg/dL   Very High Performed at [facility], [address] [place], [place] [address]    Color, Urine 08/20/2022 YELLOW  YELLOW Final   APPearance 08/20/2022 CLEAR  CLEAR Final   Specific Gravity, Urine 08/20/2022 1.020  1.005 - 1.030 Final   pH 08/20/2022 5.0  5.0 - 8.0 Final   Glucose, UA 08/20/2022 NEGATIVE  NEGATIVE mg/dL Final   Hgb urine dipstick 08/20/2022 NEGATIVE  NEGATIVE Final   Bilirubin Urine 08/20/2022 NEGATIVE  NEGATIVE Final   Ketones, ur 08/20/2022 NEGATIVE  NEGATIVE mg/dL Final   Protein, ur 32/95/1884 NEGATIVE  NEGATIVE mg/dL Final   Nitrite 16/60/6301 NEGATIVE  NEGATIVE Final   Leukocytes,Ua 08/20/2022 NEGATIVE  NEGATIVE Final   Performed at [facility], [address], [place], [place] [address]   Preg Test, Ur 08/20/2022 Negative  Negative Final   POC Amphetamine UR 08/20/2022 None Detected  NONE  DETECTED (Cut Off Level 1000 ng/mL) Final   POC Secobarbital (BAR) 08/20/2022 None Detected  NONE DETECTED (Cut Off Level 300 ng/mL) Final   POC Buprenorphine (BUP) 08/20/2022 None Detected  NONE DETECTED (Cut Off Level 10 ng/mL) Final   POC Oxazepam (BZO) 08/20/2022 None Detected  NONE DETECTED (Cut Off Level 300 ng/mL) Final   POC Cocaine UR 08/20/2022 None Detected  NONE DETECTED (Cut Off Level 300 ng/mL) Final   POC Methamphetamine UR 08/20/2022 Positive (A)  NONE DETECTED (Cut Off Level 1000 ng/mL) Final   POC Morphine 08/20/2022 None Detected  NONE DETECTED (Cut Off Level 300 ng/mL) Final   POC Methadone UR 08/20/2022 None Detected  NONE DETECTED (Cut Off Level 300 ng/mL) Final   POC Oxycodone UR 08/20/2022 None Detected  NONE DETECTED (Cut Off Level 100 ng/mL) Final   POC Marijuana UR 08/20/2022 None Detected  NONE DETECTED (Cut Off Level 50 ng/mL) Final   HIV Screen 4th Generation wRfx 08/20/2022 Non Reactive  Non Reactive Final   Performed at [facility], [address] [place], [place] [address]   SARSCOV2ONAVIRUS 2 AG 08/20/2022 NEGATIVE  NEGATIVE Final   Comment: (NOTE) SARS-CoV-2 antigen NOT DETECTED.   Negative results are presumptive.  Negative results do not preclude SARS-CoV-2 infection and should not be used as the sole basis for treatment or other patient management decisions, including infection  control decisions, particularly in the presence of clinical signs and  symptoms consistent with COVID-19, or in those who have been in contact with the virus.  Negative results must be combined with clinical observations, patient history, and epidemiological information. The expected result is Negative.  Fact Sheet for Patients: https://www.jennings-kim.com/  Fact Sheet for Healthcare Providers: https://alexander-rogers.biz/  This test is not yet approved or cleared by the [place] FDA and  has been authorized for detection and/or  diagnosis of SARS-CoV-2 by FDA under an Emergency Use Authorization (EUA).  This EUA will remain in effect (meaning this test can be used) for the duration of  the COV                          ID-19 declaration under Section 564(b)(1) of the Act, 21 U.S.C. section 360bbb-3(b)(1), unless the authorization is terminated or revoked sooner.     Preg Test, Ur 08/20/2022 NEGATIVE  NEGATIVE Final   Comment:        THE SENSITIVITY OF THIS METHODOLOGY IS >24 mIU/mL   Admission on 07/22/2022, Discharged on 07/23/2022  Component Date Value Ref Range Status   SARS Coronavirus 2 by RT PCR 07/22/2022 NEGATIVE  NEGATIVE Final   Comment: (NOTE) SARS-CoV-2 target nucleic acids are NOT DETECTED.  The SARS-CoV-2 RNA is generally detectable in upper respiratory specimens during the acute phase of infection. The lowest concentration of SARS-CoV-2 viral copies this assay can detect is 138 copies/mL. A negative result does not preclude SARS-Cov-2 infection and should not be used as the sole basis for treatment or other patient management decisions. A negative result may occur with  improper specimen collection/handling, submission of specimen other than nasopharyngeal swab, presence of viral mutation(s) within the areas targeted by this assay, and inadequate number of viral copies(<138 copies/mL). A negative result must be combined with  clinical observations, patient history, and epidemiological information. The expected result is Negative.  Fact Sheet for Patients:  BloggerCourse.comhttps://www.fda.gov/media/152166/download  Fact Sheet for Healthcare Providers:  SeriousBroker.ithttps://www.fda.gov/media/152162/download  This test is no                          t yet approved or cleared by the [place] FDA and  has been authorized for detection and/or diagnosis of SARS-CoV-2 by FDA under an Emergency Use Authorization (EUA). This EUA will remain  in effect (meaning this test can be used) for the duration of the COVID-19  declaration under Section 564(b)(1) of the Act, 21 U.S.C.section 360bbb-3(b)(1), unless the authorization is terminated  or revoked sooner.       Influenza A by PCR 07/22/2022 NEGATIVE  NEGATIVE Final   Influenza B by PCR 07/22/2022 NEGATIVE  NEGATIVE Final   Comment: (NOTE) The Xpert Xpress SARS-CoV-2/FLU/RSV plus assay is intended as an aid in the diagnosis of influenza from Nasopharyngeal swab specimens and should not be used as a sole basis for treatment. Nasal washings and aspirates are unacceptable for Xpert Xpress SARS-CoV-2/FLU/RSV testing.  Fact Sheet for Patients: BloggerCourse.comhttps://www.fda.gov/media/152166/download  Fact Sheet for Healthcare Providers: SeriousBroker.ithttps://www.fda.gov/media/152162/download  This test is not yet approved or cleared by the [place] FDA and has been authorized for detection and/or diagnosis of SARS-CoV-2 by FDA under an Emergency Use Authorization (EUA). This EUA will remain in effect (meaning this test can be used) for the duration of the COVID-19 declaration under Section 564(b)(1) of the Act, 21 U.S.C. section 360bbb-3(b)(1), unless the authorization is terminated or revoked.     Resp Syncytial Virus by PCR 07/22/2022 NEGATIVE  NEGATIVE Final   Comment: (NOTE) Fact Sheet for Patients: BloggerCourse.comhttps://www.fda.gov/media/152166/download  Fact Sheet for Healthcare Providers: SeriousBroker.ithttps://www.fda.gov/media/152162/download  This test is not yet approved or cleared by the [place] FDA and has been authorized for detection and/or diagnosis of SARS-CoV-2 by FDA under an Emergency Use Authorization (EUA). This EUA will remain in effect (meaning this test can be used) for the duration of the COVID-19 declaration under Section 564(b)(1) of the Act, 21 U.S.C. section 360bbb-3(b)(1), unless the authorization is terminated or revoked.  Performed at [facility], [address] [place], [place] [address]    Sodium 07/22/2022 136  135 - 145 mmol/L Final    Potassium 07/22/2022 4.5  3.5 - 5.1 mmol/L Final   Chloride 07/22/2022 106  98 - 111 mmol/L Final   CO2 07/22/2022 22  22 - 32 mmol/L Final   Glucose, Bld 07/22/2022 87  70 - 99 mg/dL Final   Glucose reference range applies only to samples taken after fasting for at least 8 hours.   BUN 07/22/2022 18  6 - 20 mg/dL Final   Creatinine, Ser 07/22/2022 0.61  0.44 - 1.00 mg/dL Final   Calcium 60/45/409812/19/2023 9.3  8.9 - 10.3 mg/dL Final   Total Protein 11/91/478212/19/2023 7.1  6.5 - 8.1 g/dL Final   Albumin 95/62/130812/19/2023 4.0  3.5 - 5.0 g/dL Final   AST 65/78/469612/19/2023 21  15 - 41 U/L Final   ALT 07/22/2022 18  0 - 44 U/L Final   Alkaline Phosphatase 07/22/2022 67  38 - 126 U/L Final   Total Bilirubin 07/22/2022 0.1 (L)  0.3 - 1.2 mg/dL Final   GFR, Estimated 07/22/2022 >60  >60 mL/min Final   Comment: (NOTE) Calculated using the CKD-EPI Creatinine Equation (2021)    Anion gap 07/22/2022 8  5 -  15 Final   Performed at [facility], [address] [place], [place] [address]   WBC 07/22/2022 7.2  4.0 - 10.5 K/uL Final   RBC 07/22/2022 5.27 (H)  3.87 - 5.11 MIL/uL Final   Hemoglobin 07/22/2022 15.8 (H)  12.0 - 15.0 g/dL Final   HCT 60/45/409812/19/2023 49.0 (H)  36.0 - 46.0 % Final   MCV 07/22/2022 93.0  80.0 - 100.0 fL Final   MCH 07/22/2022 30.0  26.0 - 34.0 pg Final   MCHC 07/22/2022 32.2  30.0 - 36.0 g/dL Final   RDW 11/91/478212/19/2023 13.7  11.5 - 15.5 % Final   Platelets 07/22/2022 276  150 - 400 K/uL Final   nRBC 07/22/2022 0.0  0.0 - 0.2 % Final   Performed at [facility], [address] [place], [place] [address]   I-stat hCG, quantitative 07/22/2022 <5.0  <5 mIU/mL Final   Comment 3 07/22/2022          Final   Comment:   GEST. AGE      CONC.  (mIU/mL)   <=1 WEEK        5 - 50     2 WEEKS       50 - 500     3 WEEKS       100 - 10,000     4 WEEKS     1,000 - 30,000        FEMALE AND NON-PREGNANT FEMALE:     LESS THAN 5 mIU/mL   Admission on 07/17/2022, Discharged on 07/22/2022  Component Date Value Ref  Range Status   SARS Coronavirus 2 by RT PCR 07/17/2022 NEGATIVE  NEGATIVE Final   Comment: (NOTE) SARS-CoV-2 target nucleic acids are NOT DETECTED.  The SARS-CoV-2 RNA is generally detectable in upper respiratory specimens during the acute phase of infection. The lowest concentration of SARS-CoV-2 viral copies this assay can detect is 138 copies/mL. A negative result does not preclude SARS-Cov-2 infection and should not be used as the sole basis for treatment or other patient management decisions. A negative result may occur with  improper specimen collection/handling, submission of specimen other than nasopharyngeal swab, presence of viral mutation(s) within the areas targeted by this assay, and inadequate number of viral copies(<138 copies/mL). A negative result must be combined with clinical observations, patient history, and epidemiological information. The expected result is Negative.  Fact Sheet for Patients:  BloggerCourse.comhttps://www.fda.gov/media/152166/download  Fact Sheet for Healthcare Providers:  SeriousBroker.ithttps://www.fda.gov/media/152162/download  This test is no                          t yet approved or cleared by the [place] FDA and  has been authorized for detection and/or diagnosis of SARS-CoV-2 by FDA under an Emergency Use Authorization (EUA). This EUA will remain  in effect (meaning this test can be used) for the duration of the COVID-19 declaration under Section 564(b)(1) of the Act, 21 U.S.C.section 360bbb-3(b)(1), unless the authorization is terminated  or revoked sooner.       Influenza A by PCR 07/17/2022 NEGATIVE  NEGATIVE Final   Influenza B by PCR 07/17/2022 NEGATIVE  NEGATIVE Final   Comment: (NOTE) The Xpert Xpress SARS-CoV-2/FLU/RSV plus assay is intended as an aid in the diagnosis of influenza from Nasopharyngeal swab specimens and should not be used as a sole basis for treatment. Nasal washings and aspirates are unacceptable for Xpert Xpress  SARS-CoV-2/FLU/RSV testing.  Fact Sheet for  Patients: EntrepreneurPulse.com.au  Fact Sheet for Healthcare Providers: IncredibleEmployment.be  This test is not yet approved or cleared by the [place] FDA and has been authorized for detection and/or diagnosis of SARS-CoV-2 by FDA under an Emergency Use Authorization (EUA). This EUA will remain in effect (meaning this test can be used) for the duration of the COVID-19 declaration under Section 564(b)(1) of the Act, 21 U.S.C. section 360bbb-3(b)(1), unless the authorization is terminated or revoked.     Resp Syncytial Virus by PCR 07/17/2022 NEGATIVE  NEGATIVE Final   Comment: (NOTE) Fact Sheet for Patients: EntrepreneurPulse.com.au  Fact Sheet for Healthcare Providers: IncredibleEmployment.be  This test is not yet approved or cleared by the [place] FDA and has been authorized for detection and/or diagnosis of SARS-CoV-2 by FDA under an Emergency Use Authorization (EUA). This EUA will remain in effect (meaning this test can be used) for the duration of the COVID-19 declaration under Section 564(b)(1) of the Act, 21 U.S.C. section 360bbb-3(b)(1), unless the authorization is terminated or revoked.  Performed at [facility], [place] [address] [place], [place] [address]    WBC 07/17/2022 8.9  4.0 - 10.5 K/uL Final   RBC 07/17/2022 4.64  3.87 - 5.11 MIL/uL Final   Hemoglobin 07/17/2022 14.2  12.0 - 15.0 g/dL Final   HCT 07/17/2022 41.6  36.0 - 46.0 % Final   MCV 07/17/2022 89.7  80.0 - 100.0 fL Final   MCH 07/17/2022 30.6  26.0 - 34.0 pg Final   MCHC 07/17/2022 34.1  30.0 - 36.0 g/dL Final   RDW 07/17/2022 13.7  11.5 - 15.5 % Final   Platelets 07/17/2022 306  150 - 400 K/uL Final   nRBC 07/17/2022 0.0  0.0 - 0.2 % Final   Neutrophils Relative % 07/17/2022 59  % Final   Neutro Abs 07/17/2022 5.2  1.7 - 7.7 K/uL Final   Lymphocytes Relative  07/17/2022 35  % Final   Lymphs Abs 07/17/2022 3.1  0.7 - 4.0 K/uL Final   Monocytes Relative 07/17/2022 5  % Final   Monocytes Absolute 07/17/2022 0.5  0.1 - 1.0 K/uL Final   Eosinophils Relative 07/17/2022 1  % Final   Eosinophils Absolute 07/17/2022 0.1  0.0 - 0.5 K/uL Final   Basophils Relative 07/17/2022 0  % Final   Basophils Absolute 07/17/2022 0.0  0.0 - 0.1 K/uL Final   Immature Granulocytes 07/17/2022 0  % Final   Abs Immature Granulocytes 07/17/2022 0.04  0.00 - 0.07 K/uL Final   Performed at [facility], [place] [address], [place], [place] [address]   Sodium 07/17/2022 139  135 - 145 mmol/L Final   Potassium 07/17/2022 3.7  3.5 - 5.1 mmol/L Final   Chloride 07/17/2022 104  98 - 111 mmol/L Final   CO2 07/17/2022 24  22 - 32 mmol/L Final   Glucose, Bld 07/17/2022 51 (L)  70 - 99 mg/dL Final   Glucose reference range applies only to samples taken after fasting for at least 8 hours.   BUN 07/17/2022 8  6 - 20 mg/dL Final   Creatinine, Ser 07/17/2022 0.69  0.44 - 1.00 mg/dL Final   Calcium 07/17/2022 8.8 (L)  8.9 - 10.3 mg/dL Final   Total Protein 07/17/2022 6.2 (L)  6.5 - 8.1 g/dL Final   Albumin 07/17/2022 3.6  3.5 - 5.0 g/dL Final   AST 07/17/2022 23  15 - 41 U/L Final   ALT 07/17/2022 19  0 - 44 U/L Final  Alkaline Phosphatase 07/17/2022 82  38 - 126 U/L Final   Total Bilirubin 07/17/2022 0.6  0.3 - 1.2 mg/dL Final   GFR, Estimated 07/17/2022 >60  >60 mL/min Final   Comment: (NOTE) Calculated using the CKD-EPI Creatinine Equation (2021)    Anion gap 07/17/2022 11  5 - 15 Final   Performed at [facility], [address] [place], [place] [address]   Alcohol, Ethyl (B) 07/17/2022 37 (H)  <10 mg/dL Final   Comment: (NOTE) Lowest detectable limit for serum alcohol is 10 mg/dL.  For medical purposes only. Performed at [facility], [address], [place], [place] [address]    Neisseria Gonorrhea 07/17/2022 Negative   Final   Chlamydia 07/17/2022  Positive (A)   Final   Comment 07/17/2022 Normal Reference Ranger Chlamydia - Negative   Final   Comment 07/17/2022 Normal Reference Range Neisseria Gonorrhea - Negative   Final   POC Amphetamine UR 07/17/2022 None Detected  NONE DETECTED (Cut Off Level 1000 ng/mL) Final   POC Secobarbital (BAR) 07/17/2022 None Detected  NONE DETECTED (Cut Off Level 300 ng/mL) Final   POC Buprenorphine (BUP) 07/17/2022 None Detected  NONE DETECTED (Cut Off Level 10 ng/mL) Final   POC Oxazepam (BZO) 07/17/2022 Positive (A)  NONE DETECTED (Cut Off Level 300 ng/mL) Final   POC Cocaine UR 07/17/2022 None Detected  NONE DETECTED (Cut Off Level 300 ng/mL) Final   POC Methamphetamine UR 07/17/2022 None Detected  NONE DETECTED (Cut Off Level 1000 ng/mL) Final   POC Morphine 07/17/2022 None Detected  NONE DETECTED (Cut Off Level 300 ng/mL) Final   POC Methadone UR 07/17/2022 None Detected  NONE DETECTED (Cut Off Level 300 ng/mL) Final   POC Oxycodone UR 07/17/2022 None Detected  NONE DETECTED (Cut Off Level 100 ng/mL) Final   POC Marijuana UR 07/17/2022 None Detected  NONE DETECTED (Cut Off Level 50 ng/mL) Final   Preg Test, Ur 07/17/2022 Negative  Negative Final   RPR Ser Ql 07/17/2022 NON REACTIVE  NON REACTIVE Final   Performed at [facility], [address] [place], [place] [address]   HIV Screen 4th Generation wRfx 07/17/2022 Non Reactive  Non Reactive Final   Performed at [facility], [address] [place], [place] [address]   SARSCOV2ONAVIRUS 2 AG 07/17/2022 NEGATIVE  NEGATIVE Final   Comment: (NOTE) SARS-CoV-2 antigen NOT DETECTED.   Negative results are presumptive.  Negative results do not preclude SARS-CoV-2 infection and should not be used as the sole basis for treatment or other patient management decisions, including infection  control decisions, particularly in the presence of clinical signs and  symptoms consistent with COVID-19, or in those who have been in contact with the virus.   Negative results must be combined with clinical observations, patient history, and epidemiological information. The expected result is Negative.  Fact Sheet for Patients: https://www.jennings-kim.com/  Fact Sheet for Healthcare Providers: https://alexander-rogers.biz/  This test is not yet approved or cleared by the [place] FDA and  has been authorized for detection and/or diagnosis of SARS-CoV-2 by FDA under an Emergency Use Authorization (EUA).  This EUA will remain in effect (meaning this test can be used) for the duration of  the COV                          ID-19 declaration under Section 564(b)(1) of the Act, 21 U.S.C. section 360bbb-3(b)(1), unless the authorization is terminated  or revoked sooner.     Preg Test, Ur 07/17/2022 NEGATIVE  NEGATIVE Final   Comment:        THE SENSITIVITY OF THIS METHODOLOGY IS >24 mIU/mL   Admission on 07/14/2022, Discharged on 07/14/2022  Component Date Value Ref Range Status   SARS Coronavirus 2 by RT PCR 07/14/2022 NEGATIVE  NEGATIVE Final   Comment: (NOTE) SARS-CoV-2 target nucleic acids are NOT DETECTED.  The SARS-CoV-2 RNA is generally detectable in upper respiratory specimens during the acute phase of infection. The lowest concentration of SARS-CoV-2 viral copies this assay can detect is 138 copies/mL. A negative result does not preclude SARS-Cov-2 infection and should not be used as the sole basis for treatment or other patient management decisions. A negative result may occur with  improper specimen collection/handling, submission of specimen other than nasopharyngeal swab, presence of viral mutation(s) within the areas targeted by this assay, and inadequate number of viral copies(<138 copies/mL). A negative result must be combined with clinical observations, patient history, and epidemiological information. The expected result is Negative.  Fact Sheet for Patients:   BloggerCourse.comhttps://www.fda.gov/media/152166/download  Fact Sheet for Healthcare Providers:  SeriousBroker.ithttps://www.fda.gov/media/152162/download  This test is no                          t yet approved or cleared by the [place] FDA and  has been authorized for detection and/or diagnosis of SARS-CoV-2 by FDA under an Emergency Use Authorization (EUA). This EUA will remain  in effect (meaning this test can be used) for the duration of the COVID-19 declaration under Section 564(b)(1) of the Act, 21 U.S.C.section 360bbb-3(b)(1), unless the authorization is terminated  or revoked sooner.       Influenza A by PCR 07/14/2022 NEGATIVE  NEGATIVE Final   Influenza B by PCR 07/14/2022 NEGATIVE  NEGATIVE Final   Comment: (NOTE) The Xpert Xpress SARS-CoV-2/FLU/RSV plus assay is intended as an aid in the diagnosis of influenza from Nasopharyngeal swab specimens and should not be used as a sole basis for treatment. Nasal washings and aspirates are unacceptable for Xpert Xpress SARS-CoV-2/FLU/RSV testing.  Fact Sheet for Patients: BloggerCourse.comhttps://www.fda.gov/media/152166/download  Fact Sheet for Healthcare Providers: SeriousBroker.ithttps://www.fda.gov/media/152162/download  This test is not yet approved or cleared by the [place] FDA and has been authorized for detection and/or diagnosis of SARS-CoV-2 by FDA under an Emergency Use Authorization (EUA). This EUA will remain in effect (meaning this test can be used) for the duration of the COVID-19 declaration under Section 564(b)(1) of the Act, 21 U.S.C. section 360bbb-3(b)(1), unless the authorization is terminated or revoked.  Performed at [facility], [address] [place], [place] [address]   Admission on 07/11/2022, Discharged on 07/11/2022  Component Date Value Ref Range Status   WBC 07/11/2022 9.7  4.0 - 10.5 K/uL Final   RBC 07/11/2022 5.26 (H)  3.87 - 5.11 MIL/uL Final   Hemoglobin 07/11/2022 15.9 (H)  12.0 - 15.0 g/dL Final   HCT  78/29/562112/03/2022 48.3 (H)  36.0 - 46.0 % Final   MCV 07/11/2022 91.8  80.0 - 100.0 fL Final   MCH 07/11/2022 30.2  26.0 - 34.0 pg Final   MCHC 07/11/2022 32.9  30.0 - 36.0 g/dL Final   RDW 30/86/578412/03/2022 13.5  11.5 - 15.5 % Final   Platelets 07/11/2022 282  150 - 400 K/uL Final   nRBC 07/11/2022 0.0  0.0 - 0.2 % Final   Neutrophils Relative % 07/11/2022 63  % Final  Neutro Abs 07/11/2022 6.1  1.7 - 7.7 K/uL Final   Lymphocytes Relative 07/11/2022 29  % Final   Lymphs Abs 07/11/2022 2.8  0.7 - 4.0 K/uL Final   Monocytes Relative 07/11/2022 7  % Final   Monocytes Absolute 07/11/2022 0.7  0.1 - 1.0 K/uL Final   Eosinophils Relative 07/11/2022 1  % Final   Eosinophils Absolute 07/11/2022 0.1  0.0 - 0.5 K/uL Final   Basophils Relative 07/11/2022 0  % Final   Basophils Absolute 07/11/2022 0.0  0.0 - 0.1 K/uL Final   Immature Granulocytes 07/11/2022 0  % Final   Abs Immature Granulocytes 07/11/2022 0.03  0.00 - 0.07 K/uL Final   Performed at [facility], [address] [place], [place] [address]   Sodium 07/11/2022 139  135 - 145 mmol/L Final   Potassium 07/11/2022 3.8  3.5 - 5.1 mmol/L Final   Chloride 07/11/2022 105  98 - 111 mmol/L Final   CO2 07/11/2022 24  22 - 32 mmol/L Final   Glucose, Bld 07/11/2022 90  70 - 99 mg/dL Final   Glucose reference range applies only to samples taken after fasting for at least 8 hours.   BUN 07/11/2022 9  6 - 20 mg/dL Final   Creatinine, Ser 07/11/2022 0.57  0.44 - 1.00 mg/dL Final   Calcium 60/45/4098 8.5 (L)  8.9 - 10.3 mg/dL Final   Total Protein 11/91/4782 6.3 (L)  6.5 - 8.1 g/dL Final   Albumin 95/62/1308 3.6  3.5 - 5.0 g/dL Final   AST 65/78/4696 19  15 - 41 U/L Final   ALT 07/11/2022 16  0 - 44 U/L Final   Alkaline Phosphatase 07/11/2022 78  38 - 126 U/L Final   Total Bilirubin 07/11/2022 0.4  0.3 - 1.2 mg/dL Final   GFR, Estimated 07/11/2022 >60  >60 mL/min Final   Comment: (NOTE) Calculated using the CKD-EPI Creatinine Equation (2021)     Anion gap 07/11/2022 10  5 - 15 Final   Performed at [facility], [address] [place], [place] [address]   Troponin I (High Sensitivity) 07/11/2022 <2  <18 ng/L Final   Comment: (NOTE) Elevated high sensitivity troponin I (hsTnI) values and significant  changes across serial measurements may suggest ACS but many other  chronic and acute conditions are known to elevate hsTnI results.  Refer to the "Links" section for chest pain algorithms and additional  guidance. Performed at [facility], [address], [place], [place] [address]    Lipase 07/11/2022 35  11 - 51 U/L Final   Performed at [facility], [address], [place], [place] [address]   D-Dimer, Quant 07/11/2022 <0.27  0.00 - 0.50 ug/mL-FEU Final   Comment: (NOTE) At the manufacturer cut-off value of 0.5 g/mL FEU, this assay has a negative predictive value of 95-100%.This assay is intended for use in conjunction with a clinical pretest probability (PTP) assessment model to exclude pulmonary embolism (PE) and deep venous thrombosis (DVT) in outpatients suspected of PE or DVT. Results should be correlated with clinical presentation. Performed at [facility], [address], [place], [place] [address]    Color, Urine 07/11/2022 YELLOW  YELLOW Final   APPearance 07/11/2022 HAZY (A)  CLEAR Final   Specific Gravity, Urine 07/11/2022 1.024  1.005 - 1.030 Final   pH 07/11/2022 6.0  5.0 - 8.0 Final   Glucose, UA 07/11/2022 NEGATIVE  NEGATIVE mg/dL Final   Hgb urine  dipstick 07/11/2022 NEGATIVE  NEGATIVE Final   Bilirubin Urine 07/11/2022 NEGATIVE  NEGATIVE Final   Ketones, ur 07/11/2022 NEGATIVE  NEGATIVE mg/dL Final   Protein, ur 16/05/9603 NEGATIVE  NEGATIVE mg/dL Final   Nitrite 54/04/8118 NEGATIVE  NEGATIVE Final   Leukocytes,Ua 07/11/2022 NEGATIVE  NEGATIVE Final   Performed at [facility], [address] [place], [place] [address]   SARS Coronavirus 2 by RT PCR 07/11/2022 NEGATIVE   NEGATIVE Final   Comment: (NOTE) SARS-CoV-2 target nucleic acids are NOT DETECTED.  The SARS-CoV-2 RNA is generally detectable in upper respiratory specimens during the acute phase of infection. The lowest concentration of SARS-CoV-2 viral copies this assay can detect is 138 copies/mL. A negative result does not preclude SARS-Cov-2 infection and should not be used as the sole basis for treatment or other patient management decisions. A negative result may occur with  improper specimen collection/handling, submission of specimen other than nasopharyngeal swab, presence of viral mutation(s) within the areas targeted by this assay, and inadequate number of viral copies(<138 copies/mL). A negative result must be combined with clinical observations, patient history, and epidemiological information. The expected result is Negative.  Fact Sheet for Patients:  BloggerCourse.com  Fact Sheet for Healthcare Providers:  SeriousBroker.it  This test is no                          t yet approved or cleared by the [place] FDA and  has been authorized for detection and/or diagnosis of SARS-CoV-2 by FDA under an Emergency Use Authorization (EUA). This EUA will remain  in effect (meaning this test can be used) for the duration of the COVID-19 declaration under Section 564(b)(1) of the Act, 21 U.S.C.section 360bbb-3(b)(1), unless the authorization is terminated  or revoked sooner.       Influenza A by PCR 07/11/2022 NEGATIVE  NEGATIVE Final   Influenza B by PCR 07/11/2022 NEGATIVE  NEGATIVE Final   Comment: (NOTE) The Xpert Xpress SARS-CoV-2/FLU/RSV plus assay is intended as an aid in the diagnosis of influenza from Nasopharyngeal swab specimens and should not be used as a sole basis for treatment. Nasal washings and aspirates are unacceptable for Xpert Xpress SARS-CoV-2/FLU/RSV testing.  Fact Sheet for  Patients: BloggerCourse.com  Fact Sheet for Healthcare Providers: SeriousBroker.it  This test is not yet approved or cleared by the [place] FDA and has been authorized for detection and/or diagnosis of SARS-CoV-2 by FDA under an Emergency Use Authorization (EUA). This EUA will remain in effect (meaning this test can be used) for the duration of the COVID-19 declaration under Section 564(b)(1) of the Act, 21 U.S.C. section 360bbb-3(b)(1), unless the authorization is terminated or revoked.  Performed at [facility], [address], [place], [place] [address]    I-stat hCG, quantitative 07/11/2022 <5.0  <5 mIU/mL Final   Comment 3 07/11/2022          Final   Comment:   GEST. AGE      CONC.  (mIU/mL)   <=1 WEEK        5 - 50     2 WEEKS       50 - 500     3 WEEKS       100 - 10,000     4 WEEKS     1,000 - 30,000        FEMALE AND NON-PREGNANT FEMALE:     LESS THAN 5 mIU/mL  Troponin I (High Sensitivity) 07/11/2022 2  <18 ng/L Final   Comment: (NOTE) Elevated high sensitivity troponin I (hsTnI) values and significant  changes across serial measurements may suggest ACS but many other  chronic and acute conditions are known to elevate hsTnI results.  Refer to the "Links" section for chest pain algorithms and additional  guidance. Performed at [facility], [address], [place], [place] [address]   Admission on 07/02/2022, Discharged on 07/02/2022  Component Date Value Ref Range Status   Color, Urine 07/02/2022 YELLOW  YELLOW Final   APPearance 07/02/2022 HAZY (A)  CLEAR Final   Specific Gravity, Urine 07/02/2022 1.014  1.005 - 1.030 Final   pH 07/02/2022 5.0  5.0 - 8.0 Final   Glucose, UA 07/02/2022 NEGATIVE  NEGATIVE mg/dL Final   Hgb urine dipstick 07/02/2022 SMALL (A)  NEGATIVE Final   Bilirubin Urine 07/02/2022 NEGATIVE  NEGATIVE Final   Ketones, ur 07/02/2022 NEGATIVE  NEGATIVE mg/dL Final   Protein,  ur 91/47/8295 NEGATIVE  NEGATIVE mg/dL Final   Nitrite 62/13/0865 POSITIVE (A)  NEGATIVE Final   Leukocytes,Ua 07/02/2022 LARGE (A)  NEGATIVE Final   RBC / HPF 07/02/2022 6-10  0 - 5 RBC/hpf Final   WBC, UA 07/02/2022 >50 (H)  0 - 5 WBC/hpf Final   Bacteria, UA 07/02/2022 FEW (A)  NONE SEEN Final   Squamous Epithelial / HPF 07/02/2022 6-10  0 - 5 Final   Mucus 07/02/2022 PRESENT   Final   Non Squamous Epithelial 07/02/2022 0-5 (A)  NONE SEEN Final   Performed at [facility], [address] [place], [place] [address]   WBC 07/02/2022 9.9  4.0 - 10.5 K/uL Final   RBC 07/02/2022 5.18 (H)  3.87 - 5.11 MIL/uL Final   Hemoglobin 07/02/2022 15.8 (H)  12.0 - 15.0 g/dL Final   HCT 62/95/2841 48.5 (H)  36.0 - 46.0 % Final   MCV 07/02/2022 93.6  80.0 - 100.0 fL Final   MCH 07/02/2022 30.5  26.0 - 34.0 pg Final   MCHC 07/02/2022 32.6  30.0 - 36.0 g/dL Final   RDW 32/44/0102 13.5  11.5 - 15.5 % Final   Platelets 07/02/2022 249  150 - 400 K/uL Final   nRBC 07/02/2022 0.0  0.0 - 0.2 % Final   Performed at [facility], [address] [place], [place] [address]   Sodium 07/02/2022 138  135 - 145 mmol/L Final   Potassium 07/02/2022 3.9  3.5 - 5.1 mmol/L Final   Chloride 07/02/2022 105  98 - 111 mmol/L Final   CO2 07/02/2022 26  22 - 32 mmol/L Final   Glucose, Bld 07/02/2022 114 (H)  70 - 99 mg/dL Final   Glucose reference range applies only to samples taken after fasting for at least 8 hours.   BUN 07/02/2022 9  6 - 20 mg/dL Final   Creatinine, Ser 07/02/2022 0.64  0.44 - 1.00 mg/dL Final   Calcium 64/40/3474 8.9  8.9 - 10.3 mg/dL Final   GFR, Estimated 07/02/2022 >60  >60 mL/min Final   Comment: (NOTE) Calculated using the CKD-EPI Creatinine Equation (2021)    Anion gap 07/02/2022 7  5 - 15 Final   Performed at [facility], [address], [place], [place] [address]   Lipase 07/02/2022 35  11 - 51 U/L Final   Performed at [facility], [address], [place], [place] [address]   Total Protein 07/02/2022 6.9  6.5 -  8.1 g/dL Final   Albumin 16/10/960411/29/2023 3.8  3.5 - 5.0 g/dL Final   AST 54/09/811911/29/2023 17  15 - 41 U/L Final   ALT 07/02/2022 16  0 - 44 U/L Final   Alkaline Phosphatase 07/02/2022 91  38 - 126 U/L Final   Total Bilirubin 07/02/2022 0.9  0.3 - 1.2 mg/dL Final   Bilirubin, Direct 07/02/2022 0.1  0.0 - 0.2 mg/dL Final   Indirect Bilirubin 07/02/2022 0.8  0.3 - 0.9 mg/dL Final   Performed at [facility], [address] [place], [place] [address]  Admission on 06/06/2022, Discharged on 06/06/2022  Component Date Value Ref Range Status   Sodium 06/06/2022 140  135 - 145 mmol/L Final   Potassium 06/06/2022 3.2 (L)  3.5 - 5.1 mmol/L Final   Chloride 06/06/2022 103  98 - 111 mmol/L Final   CO2 06/06/2022 26  22 - 32 mmol/L Final   Glucose, Bld 06/06/2022 97  70 - 99 mg/dL Final   Glucose reference range applies only to samples taken after fasting for at least 8 hours.   BUN 06/06/2022 6  6 - 20 mg/dL Final   Creatinine, Ser 06/06/2022 0.54  0.44 - 1.00 mg/dL Final   Calcium 95/62/130811/10/2021 9.0  8.9 - 10.3 mg/dL Final   Total Protein 65/78/469611/10/2021 6.5  6.5 - 8.1 g/dL Final   Albumin 29/52/841311/10/2021 3.6  3.5 - 5.0 g/dL Final   AST 24/40/102711/10/2021 20  15 - 41 U/L Final   ALT 06/06/2022 17  0 - 44 U/L Final   Alkaline Phosphatase 06/06/2022 111  38 - 126 U/L Final   Total Bilirubin 06/06/2022 0.3  0.3 - 1.2 mg/dL Final   GFR, Estimated 06/06/2022 >60  >60 mL/min Final   Comment: (NOTE) Calculated using the CKD-EPI Creatinine Equation (2021)    Anion gap 06/06/2022 11  5 - 15 Final   Performed at [facility], [address] [place], [place] [address]   Alcohol, Ethyl (B) 06/06/2022 18 (H)  <10 mg/dL Final   Comment: (NOTE) Lowest detectable limit for serum alcohol is 10 mg/dL.  For medical purposes only. Performed at [facility], [address] [place], [place] [address]    WBC  06/06/2022 6.0  4.0 - 10.5 K/uL Final   RBC 06/06/2022 4.76  3.87 - 5.11 MIL/uL Final   Hemoglobin 06/06/2022 14.5  12.0 - 15.0 g/dL Final   HCT 74/25/956311/10/2021 43.9  36.0 - 46.0 % Final   MCV 06/06/2022 92.2  80.0 - 100.0 fL Final   MCH 06/06/2022 30.5  26.0 - 34.0 pg Final   MCHC 06/06/2022 33.0  30.0 - 36.0 g/dL Final   RDW 87/56/433211/10/2021 13.5  11.5 - 15.5 % Final   Platelets 06/06/2022 224  150 - 400 K/uL Final   nRBC 06/06/2022 0.0  0.0 - 0.2 % Final   Performed at [facility], [address] [place], [place] [address]   I-stat hCG, quantitative 06/06/2022 <5.0  <5 mIU/mL Final   Comment 3 06/06/2022          Final   Comment:   GEST. AGE      CONC.  (mIU/mL)   <=1 WEEK        5 - 50     2 WEEKS       50 - 500     3 WEEKS       100 - 10,000     4 WEEKS  1,000 - 30,000        FEMALE AND NON-PREGNANT FEMALE:     LESS THAN 5 mIU/mL   Admission on 06/05/2022, Discharged on 06/05/2022  Component Date Value Ref Range Status   Sodium 06/05/2022 139  135 - 145 mmol/L Final   Potassium 06/05/2022 3.7  3.5 - 5.1 mmol/L Final   Chloride 06/05/2022 105  98 - 111 mmol/L Final   CO2 06/05/2022 26  22 - 32 mmol/L Final   Glucose, Bld 06/05/2022 114 (H)  70 - 99 mg/dL Final   Glucose reference range applies only to samples taken after fasting for at least 8 hours.   BUN 06/05/2022 8  6 - 20 mg/dL Final   Creatinine, Ser 06/05/2022 0.50  0.44 - 1.00 mg/dL Final   Calcium 46/96/2952 8.8 (L)  8.9 - 10.3 mg/dL Final   Total Protein 84/13/2440 7.1  6.5 - 8.1 g/dL Final   Albumin 05/31/2535 3.9  3.5 - 5.0 g/dL Final   AST 64/40/3474 20  15 - 41 U/L Final   ALT 06/05/2022 17  0 - 44 U/L Final   Alkaline Phosphatase 06/05/2022 120  38 - 126 U/L Final   Total Bilirubin 06/05/2022 0.5  0.3 - 1.2 mg/dL Final   GFR, Estimated 06/05/2022 >60  >60 mL/min Final   Comment: (NOTE) Calculated using the CKD-EPI Creatinine Equation (2021)    Anion gap 06/05/2022 8  5 - 15 Final   Performed at [facility], [address] [place], [place] [address]   WBC 06/05/2022 4.9  4.0 - 10.5 K/uL Final   RBC 06/05/2022 5.00  3.87 - 5.11 MIL/uL Final   Hemoglobin 06/05/2022 15.1 (H)  12.0 - 15.0 g/dL Final   HCT 38/75/6433 46.0  36.0 - 46.0 % Final   MCV 06/05/2022 92.0  80.0 - 100.0 fL Final   MCH 06/05/2022 30.2  26.0 - 34.0 pg Final   MCHC 06/05/2022 32.8  30.0 - 36.0 g/dL Final   RDW 29/51/8841 13.6  11.5 - 15.5 % Final   Platelets 06/05/2022 248  150 - 400 K/uL Final   nRBC 06/05/2022 0.0  0.0 - 0.2 % Final   Performed at [facility], [address], [place], [place] [address]   Opiates 06/05/2022 NONE DETECTED  NONE DETECTED Final   Cocaine 06/05/2022 NONE DETECTED  NONE DETECTED Final   Benzodiazepines 06/05/2022 NONE DETECTED  NONE DETECTED Final   Amphetamines 06/05/2022 POSITIVE (A)  NONE DETECTED Final   Tetrahydrocannabinol 06/05/2022 NONE DETECTED  NONE DETECTED Final   Barbiturates 06/05/2022 NONE DETECTED  NONE DETECTED Final   Comment: (NOTE) DRUG SCREEN FOR MEDICAL PURPOSES ONLY.  IF CONFIRMATION IS NEEDED FOR ANY PURPOSE, NOTIFY LAB WITHIN 5 DAYS.  LOWEST DETECTABLE LIMITS FOR URINE DRUG SCREEN Drug Class                     Cutoff (ng/mL) Amphetamine and metabolites    1000 Barbiturate and metabolites    200 Benzodiazepine                 200 Opiates and metabolites        300 Cocaine and metabolites        300 THC                            50 Performed at [facility], [address] [place], [place]  [address]    Alcohol, Ethyl (B) 06/05/2022 <10  <10 mg/dL Final   Comment: (NOTE) Lowest detectable limit for serum alcohol is 10 mg/dL.  For medical purposes only. Performed at [facility], [address], [place], [place] [address]   Admission on 05/10/2022, Discharged on 05/10/2022  Component Date Value Ref Range Status   Opiates 05/10/2022 NONE DETECTED  NONE DETECTED Final   Cocaine  05/10/2022 POSITIVE (A)  NONE DETECTED Final   Benzodiazepines 05/10/2022 NONE DETECTED  NONE DETECTED Final   Amphetamines 05/10/2022 NONE DETECTED  NONE DETECTED Final   Tetrahydrocannabinol 05/10/2022 NONE DETECTED  NONE DETECTED Final   Barbiturates 05/10/2022 NONE DETECTED  NONE DETECTED Final   Comment: (NOTE) DRUG SCREEN FOR MEDICAL PURPOSES ONLY.  IF CONFIRMATION IS NEEDED FOR ANY PURPOSE, NOTIFY LAB WITHIN 5 DAYS.  LOWEST DETECTABLE LIMITS FOR URINE DRUG SCREEN Drug Class                     Cutoff (ng/mL) Amphetamine and metabolites    1000 Barbiturate and metabolites    200 Benzodiazepine                 200 Tricyclics and metabolites     300 Opiates and metabolites        300 Cocaine and metabolites        300 THC                            50 Performed at [facility], [address] [place], [place] [address]    Alcohol, Ethyl (B) 05/10/2022 <10  <10 mg/dL Final   Comment: (NOTE) Lowest detectable limit for serum alcohol is 10 mg/dL.  For medical purposes only. Performed at [facility], [address], [place], [place] [address]    Sodium 05/10/2022 137  135 - 145 mmol/L Final   Potassium 05/10/2022 4.2  3.5 - 5.1 mmol/L Final   Chloride 05/10/2022 102  98 - 111 mmol/L Final   CO2 05/10/2022 29  22 - 32 mmol/L Final   Glucose, Bld 05/10/2022 90  70 - 99 mg/dL Final   Glucose reference range applies only to samples taken after fasting for at least 8 hours.   BUN 05/10/2022 11  6 - 20 mg/dL Final   Creatinine, Ser 05/10/2022 0.57  0.44 - 1.00 mg/dL Final   Calcium 29/56/2130 8.7 (L)  8.9 - 10.3 mg/dL Final   Total Protein 86/57/8469 7.2  6.5 - 8.1 g/dL Final   Albumin 62/95/2841 3.7  3.5 - 5.0 g/dL Final   AST 32/44/0102 18  15 - 41 U/L Final   ALT 05/10/2022 16  0 - 44 U/L Final   Alkaline Phosphatase 05/10/2022 97  38 - 126 U/L Final   Total Bilirubin 05/10/2022 0.5  0.3 - 1.2 mg/dL Final   GFR, Estimated 05/10/2022  >60  >60 mL/min Final   Comment: (NOTE) Calculated using the CKD-EPI Creatinine Equation (2021)    Anion gap 05/10/2022 6  5 - 15 Final   Performed at [facility], [address], [place], [place] [address]   WBC 05/10/2022 6.4  4.0 - 10.5 K/uL Final   RBC 05/10/2022 5.56 (H)  3.87 - 5.11 MIL/uL Final   Hemoglobin 05/10/2022 16.3 (H)  12.0 - 15.0 g/dL Final   HCT 64/40/3474 50.7 (H)  36.0 - 46.0 % Final   MCV 05/10/2022  91.2  80.0 - 100.0 fL Final   MCH 05/10/2022 29.3  26.0 - 34.0 pg Final   MCHC 05/10/2022 32.1  30.0 - 36.0 g/dL Final   RDW 13/24/4010 13.2  11.5 - 15.5 % Final   Platelets 05/10/2022 250  150 - 400 K/uL Final   nRBC 05/10/2022 0.0  0.0 - 0.2 % Final   Neutrophils Relative % 05/10/2022 49  % Final   Neutro Abs 05/10/2022 3.2  1.7 - 7.7 K/uL Final   Lymphocytes Relative 05/10/2022 40  % Final   Lymphs Abs 05/10/2022 2.6  0.7 - 4.0 K/uL Final   Monocytes Relative 05/10/2022 8  % Final   Monocytes Absolute 05/10/2022 0.5  0.1 - 1.0 K/uL Final   Eosinophils Relative 05/10/2022 1  % Final   Eosinophils Absolute 05/10/2022 0.0  0.0 - 0.5 K/uL Final   Basophils Relative 05/10/2022 1  % Final   Basophils Absolute 05/10/2022 0.0  0.0 - 0.1 K/uL Final   Immature Granulocytes 05/10/2022 1  % Final   Abs Immature Granulocytes 05/10/2022 0.04  0.00 - 0.07 K/uL Final   Performed at [facility], [address], [place], [place] [address]   Lipase 05/10/2022 34  11 - 51 U/L Final   Performed at [facility], [address], [place], [place] [address]  Admission on 05/08/2022, Discharged on 05/09/2022  Component Date Value Ref Range Status   SARS Coronavirus 2 by RT PCR 05/08/2022 NEGATIVE  NEGATIVE Final   Comment: (NOTE) SARS-CoV-2 target nucleic acids are NOT DETECTED.  The SARS-CoV-2 RNA is generally detectable in upper respiratory specimens during the acute phase of infection. The lowest concentration of SARS-CoV-2  viral copies this assay can detect is 138 copies/mL. A negative result does not preclude SARS-Cov-2 infection and should not be used as the sole basis for treatment or other patient management decisions. A negative result may occur with  improper specimen collection/handling, submission of specimen other than nasopharyngeal swab, presence of viral mutation(s) within the areas targeted by this assay, and inadequate number of viral copies(<138 copies/mL). A negative result must be combined with clinical observations, patient history, and epidemiological information. The expected result is Negative.  Fact Sheet for Patients:  BloggerCourse.com  Fact Sheet for Healthcare Providers:  SeriousBroker.it  This test is no                          t yet approved or cleared by the [place] FDA and  has been authorized for detection and/or diagnosis of SARS-CoV-2 by FDA under an Emergency Use Authorization (EUA). This EUA will remain  in effect (meaning this test can be used) for the duration of the COVID-19 declaration under Section 564(b)(1) of the Act, 21 U.S.C.section 360bbb-3(b)(1), unless the authorization is terminated  or revoked sooner.       Influenza A by PCR 05/08/2022 NEGATIVE  NEGATIVE Final   Influenza B by PCR 05/08/2022 NEGATIVE  NEGATIVE Final   Comment: (NOTE) The Xpert Xpress SARS-CoV-2/FLU/RSV plus assay is intended as an aid in the diagnosis of influenza from Nasopharyngeal swab specimens and should not be used as a sole basis for treatment. Nasal washings and aspirates are unacceptable for Xpert Xpress SARS-CoV-2/FLU/RSV testing.  Fact Sheet for Patients: BloggerCourse.com  Fact Sheet for Healthcare Providers: SeriousBroker.it  This test is not yet approved or cleared by the [place] FDA and has been authorized for detection and/or diagnosis of SARS-CoV-2  by FDA under an Emergency Use Authorization (EUA). This EUA will remain in effect (meaning this test can be used) for the duration of the COVID-19 declaration under Section 564(b)(1) of the Act, 21 U.S.C. section 360bbb-3(b)(1), unless the authorization is terminated or revoked.  Performed at [facility], [address], [place], [place] [address]    WBC 05/08/2022 6.5  4.0 - 10.5 K/uL Final   RBC 05/08/2022 5.36 (H)  3.87 - 5.11 MIL/uL Final   Hemoglobin 05/08/2022 16.1 (H)  12.0 - 15.0 g/dL Final   HCT 61/44/3154 47.9 (H)  36.0 - 46.0 % Final   MCV 05/08/2022 89.4  80.0 - 100.0 fL Final   MCH 05/08/2022 30.0  26.0 - 34.0 pg Final   MCHC 05/08/2022 33.6  30.0 - 36.0 g/dL Final   RDW 00/86/7619 13.2  11.5 - 15.5 % Final   Platelets 05/08/2022 237  150 - 400 K/uL Final   nRBC 05/08/2022 0.0  0.0 - 0.2 % Final   Neutrophils Relative % 05/08/2022 37  % Final   Neutro Abs 05/08/2022 2.4  1.7 - 7.7 K/uL Final   Lymphocytes Relative 05/08/2022 52  % Final   Lymphs Abs 05/08/2022 3.4  0.7 - 4.0 K/uL Final   Monocytes Relative 05/08/2022 8  % Final   Monocytes Absolute 05/08/2022 0.5  0.1 - 1.0 K/uL Final   Eosinophils Relative 05/08/2022 1  % Final   Eosinophils Absolute 05/08/2022 0.1  0.0 - 0.5 K/uL Final   Basophils Relative 05/08/2022 1  % Final   Basophils Absolute 05/08/2022 0.0  0.0 - 0.1 K/uL Final   Immature Granulocytes 05/08/2022 1  % Final   Abs Immature Granulocytes 05/08/2022 0.05  0.00 - 0.07 K/uL Final   Performed at [facility], [address] [place], [place] [address]   Sodium 05/08/2022 135  135 - 145 mmol/L Final   Potassium 05/08/2022 3.9  3.5 - 5.1 mmol/L Final   Chloride 05/08/2022 100  98 - 111 mmol/L Final   CO2 05/08/2022 27  22 - 32 mmol/L Final   Glucose, Bld 05/08/2022 93  70 - 99 mg/dL Final   Glucose reference range applies only to samples taken after fasting for at least 8 hours.   BUN 05/08/2022 8  6 - 20 mg/dL Final   Creatinine, Ser  05/08/2022 0.68  0.44 - 1.00 mg/dL Final   Calcium 67/07/4579 9.0  8.9 - 10.3 mg/dL Final   Total Protein 99/83/3825 6.6  6.5 - 8.1 g/dL Final   Albumin 05/39/7673 3.7  3.5 - 5.0 g/dL Final   AST 41/93/7902 17  15 - 41 U/L Final   ALT 05/08/2022 17  0 - 44 U/L Final   Alkaline Phosphatase 05/08/2022 111  38 - 126 U/L Final   Total Bilirubin 05/08/2022 0.6  0.3 - 1.2 mg/dL Final   GFR, Estimated 05/08/2022 >60  >60 mL/min Final   Comment: (NOTE) Calculated using the CKD-EPI Creatinine Equation (2021)    Anion gap 05/08/2022 8  5 - 15 Final   Performed at [facility], [address], [place], [place] [address]   Hgb A1c MFr Bld 05/08/2022 4.9  4.8 - 5.6 % Final   Comment: (NOTE) Pre diabetes:          5.7%-6.4%  Diabetes:              >6.4%  Glycemic control for   <7.0% adults with diabetes    Mean  Plasma Glucose 05/08/2022 93.93  mg/dL Final   Performed at [facility], [address] [place], [place] [address]   Preg Test, Ur 05/08/2022 NEGATIVE  NEGATIVE Final   Performed at [facility], [address] [place], [place] [address]   POC Amphetamine UR 05/08/2022 None Detected  NONE DETECTED (Cut Off Level 1000 ng/mL) Final   POC Secobarbital (BAR) 05/08/2022 None Detected  NONE DETECTED (Cut Off Level 300 ng/mL) Final   POC Buprenorphine (BUP) 05/08/2022 None Detected  NONE DETECTED (Cut Off Level 10 ng/mL) Final   POC Oxazepam (BZO) 05/08/2022 None Detected  NONE DETECTED (Cut Off Level 300 ng/mL) Final   POC Cocaine UR 05/08/2022 None Detected  NONE DETECTED (Cut Off Level 300 ng/mL) Final   POC Methamphetamine UR 05/08/2022 Positive (A)  NONE DETECTED (Cut Off Level 1000 ng/mL) Final   POC Morphine 05/08/2022 None Detected  NONE DETECTED (Cut Off Level 300 ng/mL) Final   POC Methadone UR 05/08/2022 None Detected  NONE DETECTED (Cut Off Level 300 ng/mL) Final   POC Oxycodone UR 05/08/2022 None Detected  NONE DETECTED (Cut Off Level 100 ng/mL) Final   POC  Marijuana UR 05/08/2022 None Detected  NONE DETECTED (Cut Off Level 50 ng/mL) Final   SARSCOV2ONAVIRUS 2 AG 05/08/2022 NEGATIVE  NEGATIVE Final   Comment: (NOTE) SARS-CoV-2 antigen NOT DETECTED.   Negative results are presumptive.  Negative results do not preclude SARS-CoV-2 infection and should not be used as the sole basis for treatment or other patient management decisions, including infection  control decisions, particularly in the presence of clinical signs and  symptoms consistent with COVID-19, or in those who have been in contact with the virus.  Negative results must be combined with clinical observations, patient history, and epidemiological information. The expected result is Negative.  Fact Sheet for Patients: https://www.jennings-kim.com/  Fact Sheet for Healthcare Providers: https://alexander-rogers.biz/  This test is not yet approved or cleared by the [place] FDA and  has been authorized for detection and/or diagnosis of SARS-CoV-2 by FDA under an Emergency Use Authorization (EUA).  This EUA will remain in effect (meaning this test can be used) for the duration of  the COV                          ID-19 declaration under Section 564(b)(1) of the Act, 21 U.S.C. section 360bbb-3(b)(1), unless the authorization is terminated or revoked sooner.     Preg Test, Ur 05/08/2022 NEGATIVE  NEGATIVE Final   Comment:        THE SENSITIVITY OF THIS METHODOLOGY IS >24 mIU/mL    Cholesterol 05/08/2022 187  0 - 200 mg/dL Final   Triglycerides 91/47/8295 117  <150 mg/dL Final   HDL 62/13/0865 67  >40 mg/dL Final   Total CHOL/HDL Ratio 05/08/2022 2.8  RATIO Final   VLDL 05/08/2022 23  0 - 40 mg/dL Final   LDL Cholesterol 05/08/2022 97  0 - 99 mg/dL Final   Comment:        Total Cholesterol/HDL:CHD Risk Coronary Heart Disease Risk Table                     Men   Women  1/2 Average Risk   3.4   3.3  Average Risk       5.0   4.4  2 X Average Risk    9.6   7.1  3 X Average Risk  23.4   11.0        Use the calculated Patient Ratio above and the CHD Risk Table to determine the patient's CHD Risk.        ATP III CLASSIFICATION (LDL):  <100     mg/dL   Optimal  100-129  mg/dL   Near or Above                    Optimal  130-159  mg/dL   Borderline  160-189  mg/dL   High  >190     mg/dL   Very High Performed at [facility] [address] [place], [place] [address]    TSH 05/08/2022 3.264  0.350 - 4.500 uIU/mL Final   Comment: Performed by a 3rd Generation assay with a functional sensitivity of <=0.01 uIU/mL. Performed at [facility], [place] [address] [place], [place] [address]   There may be more visits with results that are not included.    Blood Alcohol level:  Lab Results  Component Value Date   ETH <10 08/20/2022   ETH 37 (H) 10/62/6948    Metabolic Disorder Labs: Lab Results  Component Value Date   HGBA1C 5.4 08/20/2022   MPG 108.28 08/20/2022   MPG 93.93 05/08/2022   No results found for: "PROLACTIN" Lab Results  Component Value Date   CHOL 180 08/20/2022   TRIG 181 (H) 08/20/2022   HDL 77 08/20/2022   CHOLHDL 2.3 08/20/2022   VLDL 36 08/20/2022   LDLCALC 67 08/20/2022   LDLCALC 97 05/08/2022    Therapeutic Lab Levels: No results found for: "LITHIUM" No results found for: "VALPROATE" No results found for: "CBMZ"  Physical Findings   AIMS    Flowsheet Row ED to Hosp-Admission (Discharged) from 03/10/2022 in [place] 300[facility]  AIMS Total Score 0      AUDIT    Flowsheet Row ED to Hosp-Admission (Discharged) from 03/10/2022 in [place] 300B  Alcohol Use Disorder Identification Test Final Score (AUDIT) [facility] from 01/17/2022 in [facility] Emergency Department at [place] Office Visit from 03/12/2021 in [place] 1  Total  GAD-7 Score 20      PHQ2-9    [place] ED from 08/20/2022 in [facility] ED from 07/17/2022 in [facility] ED from 05/08/2022 in [facility] Office Visit from 03/12/2021 in [place] 1  PHQ-2 Total Score 6 6 2 5   PHQ-9 Total Score 22 22 11 22       Flowsheet Row ED from 08/20/2022 in [facility] ED from 07/23/2022 in [facility] ED from 07/22/2022 in [facility] Emergency Department at [place] No Risk Low Risk No Risk      Psychiatric Specialty Exam: Physical Exam Constitutional:      Appearance: the patient is not toxic-appearing.  Pulmonary:     Effort: Pulmonary effort is normal.  Neurological:     General: No focal deficit present.     Mental Status: the patient is alert and oriented to person, place, and time.   Review of Systems  Respiratory:  Negative for shortness of breath.   Cardiovascular:  Negative for chest pain.  Gastrointestinal:  Negative for abdominal pain, constipation, diarrhea, nausea and vomiting.  Neurological:  Negative for headaches.      BP (!) 103/59 (BP Location: Left Arm) Comment: Rn is aware  Pulse 90   Temp 97.7 F (36.5 C) (Oral)   Resp 18   Ht 5\' 3"  (1.6 m)   Wt 110 lb (49.9 kg)   SpO2 97%   BMI 19.49 kg/m   General Appearance: disheveled  Eye Contact:  Good  Speech:  Clear and Coherent  Volume:  Normal  Mood:  Euthymic  Affect:  Congruent  Thought Process:  Coherent  Orientation:  Full (Time, Place, and Person)  Thought Content: Logical   Suicidal Thoughts:  No  Homicidal Thoughts:  No  Memory:  Immediate;   Good  Judgement:  fair  Insight:  fair  Psychomotor Activity:  Normal  Concentration:  Concentration: Good  Recall:  Good  Fund of Knowledge: Good  Language: Good  Akathisia:  No  Handed:    AIMS (if indicated): not done  Assets:   Communication Skills Desire for Improvement Leisure Time Physical Health  ADL's:  Intact  Cognition: WNL  Sleep:  Fair     Treatment Plan Summary: Daily contact with patient to assess and evaluate symptoms and progress in treatment and Medication management  Status: Voluntary, no acute safety concerns  Methamphetamine use disorder, alcohol use disorder - Symptomatic management of withdrawal symptoms from methamphetamine - Librium taper due to concern for alcohol withdrawal - Pursue residential rehab placement  Continue home medications as ordered: - Abilify 10 mg daily - Lexapro 10 mg daily - 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  She had a large PE previously and failed apixaban.  Note from Dr. Myna Hidalgo, oncologist, from 2022 states "she really needs to be on lifelong anticoagulation at this point".  At that time she was anticoagulated with warfarin but seems to switched to Xarelto more recently.  Given this information we will continue Xarelto indefinitely until she can follow-up with primary medical team.  Medical: - Urine pregnancy test negative - Labs otherwise unremarkable -Await results of respiratory panel   Carlyn Reichert, MD 08/22/2022 1:44 PM

## 2022-08-22 NOTE — Discharge Planning (Signed)
LCSW spoke with patient this morning regarding disposition plans. Patient made aware that referral has been sent to [facility] for review and LCSW is just awaiting update. Patient expressed understanding. Patient informed that she was denied by [facility] due to no beds being available at this time. Per Admissions Coordinator [facility], the patient has also received services in the past from them and would have to be staffed for future referrals. Patient informed LCSW that she really does not want to go to [place] in [place]. Patient was made aware that [facility] would be an additional follow up plan if [name] denied her. Patient expressed understanding and stated she would fill out required documents. Once completed, LCSW will fax over for review. Patient was also provided the longterm residential facilities for her follow up. No other needs to report at this time.   LCSW will continue to follow and provide updates as received.   [name], LCSW Clinical Social Worker [facility] Ph: [phone]

## 2022-08-22 NOTE — ED Notes (Signed)
Pt sleeping in no acute distress. RR even and unlabored. Environment secured. Will continue to monitor for safety. 

## 2022-08-22 NOTE — ED Notes (Signed)
Patient A&Ox4. Denies intent to harm self/others when asked. Denies A/VH. Patient c/o HA rating 9/10 earlier this am relieved with Tylenol 650mg . Pt states, "mentally, I'm not doing that good. I need to stop drinking so much". Support and encouragement provided. Routine safety checks conducted according to facility protocol. Encouraged patient to notify staff if thoughts of harm toward self or others arise. Patient verbalize understanding and agreement. Will continue to monitor for safety.

## 2022-08-22 NOTE — ED Notes (Signed)
Pt sleeping@this time. Breathing even and unlabored. Will continue to monitor for safety 

## 2022-08-22 NOTE — ED Notes (Signed)
Pt in room resting in no acute distress. Writer performed PCR test per MD orders. Pt tolerated well. RR even and unlabored. Environment secured. Will continue to monitor for safety.

## 2022-08-23 DIAGNOSIS — F1910 Other psychoactive substance abuse, uncomplicated: Secondary | ICD-10-CM | POA: Diagnosis not present

## 2022-08-23 DIAGNOSIS — F1094 Alcohol use, unspecified with alcohol-induced mood disorder: Secondary | ICD-10-CM | POA: Diagnosis not present

## 2022-08-23 DIAGNOSIS — Z5900 Homelessness unspecified: Secondary | ICD-10-CM | POA: Diagnosis not present

## 2022-08-23 DIAGNOSIS — Z1152 Encounter for screening for COVID-19: Secondary | ICD-10-CM | POA: Diagnosis not present

## 2022-08-23 NOTE — ED Notes (Signed)
Patient did not attend group.

## 2022-08-23 NOTE — ED Provider Notes (Signed)
Behavioral Health Progress Note  Date and Time: 08/23/2022 12:08 PM Name: Kristina Owens MRN:  [id number]  Subjective:   The patient is a 57 year old female well-known to this service line. She has a history of cocaine, alcohol, and methamphetamine use disorders. She has been homeless for some time. [facility]. The last 1 was in December, when she was discharged to [facility]. She reported to LCSW that she was denied because she did not have her Xarelto. On initial interview the patient appeared to have physical symptoms, possibly of methamphetamine withdrawal (the only substance for which she was positive in her UDS). Started on Librium taper due to concern for alcohol withdrawal.   On assessment today patient reports that she slept well and her appetite is "good." Patient reports that she is feeling "unwell" and endorses that she feels like she has a tremor, decreased energy, and paranoia. Patient endorses general paranoia "people are out to get me" but endorses she feels safe on the unit.   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 Patient also denies SI and HI.   Patient reports that her last drink was 2 days ago and she drank, 1 pint of Vodka and 2 shots.   Patient reports she really wants to go to rehab, and has been refusing her Xarelto because she has not been able to get into rehab, because it is on her medication list, and she does not have the medication on arrival and she is not admitted due to medical concern. Patient and provider discuss that patient must take her Xarelto while at this facility and patient agrees.   Diagnosis:  Final diagnoses:  Alcohol use disorder, severe, dependence (HCC)  Alcohol-induced mood disorder with depressive symptoms (HCC)  Polysubstance abuse (HCC)  Homelessness    Total  Time spent with patient: 20 minutes  Past Psychiatric History: Per above Past Medical History:  Past Medical History:  Diagnosis Date   Anxiety    Depression    DVT (deep vein thrombosis) in pregnancy    GERD (gastroesophageal reflux disease)    Ovarian cyst     Past Surgical History:  Procedure Laterality Date   APPENDECTOMY  age 57   IR PTA VENOUS EXCEPT DIALYSIS CIRCUIT  01/16/2021   IR RADIOLOGIST EVAL & MGMT  03/19/2021   IR THROMBECT VENO MECH MOD SED  01/16/2021   IR US GUIDE VASC ACCESS LEFT  01/16/2021   IR VENO/EXT/UNI LEFT  01/16/2021   IR VENOCAVAGRAM IVC  01/16/2021   TONSILLECTOMY Bilateral age 57   Family History:  Family History  Problem Relation Age of Onset   Asthma Mother    COPD Mother    Cancer Father        thyroid cancer   Family Psychiatric  History: None Social History:  Social History   Substance and Sexual Activity  Alcohol Use Yes     Social History   Substance and Sexual Activity  Drug Use Yes   Types: "Crack" cocaine, Cocaine, Methamphetamines   Comment: last used 4/31/22    Social History   Socioeconomic History   Marital status: Divorced    Spouse name: Not on file   Number of children: Not on file   Years of education: Not on file   Highest education level: Not on  file  Occupational History   Not on file  Tobacco Use   Smoking status: Every Day    Packs/day: 0.50    Years: 35.00    Total pack years: 17.50    Types: Cigarettes   Smokeless tobacco: Never  Vaping Use   Vaping Use: Never used  Substance and Sexual Activity   Alcohol use: Yes   Drug use: Yes    Types: "Crack" cocaine, Cocaine, Methamphetamines    Comment: last used 4/31/22   Sexual activity: Not on file  Other Topics Concern   Not on file  Social History Narrative   Not on file   Social Determinants of Health   Financial Resource Strain: Not on file  Food Insecurity: Not on file  Transportation Needs: Not on file  Physical Activity: Not on file   Stress: Not on file  Social Connections: Not on file   SDOH:  SDOH Screenings   Alcohol Screen: High Risk (03/10/2022)  Depression (PHQ2-9): High Risk (08/23/2022)  Tobacco Use: High Risk (08/20/2022)   Additional Social History:    Pain Medications: See MAR Prescriptions: See MAR Over the Counter: See MAR History of alcohol / drug use?: Yes Longest period of sobriety (when/how long): 2 months Negative Consequences of Use: Legal Withdrawal Symptoms: Nausea / Vomiting, Tremors, Fever / Chills, Sweats, Other (Comment) (Headache) Name of Substance 1: Alcohol 1 - Age of First Use: 16 1 - Amount (size/oz): 1 pint 1 - Frequency: daily 1 - Duration: ongoing 1 - Last Use / Amount: 2 hours ago 1 - Method of Aquiring: purchase 1- Route of Use: drinking Name of Substance 2: Methamphetamine 2 - Age of First Use: 47 2 - Amount (size/oz): $20 worth 2 - Frequency: Weekly 2 - Duration: Ongoing 2 - Last Use / Amount: 5 days ago 2 - Method of Aquiring: UTA 2 - Route of Substance Use: intervenous                Sleep: Good  Appetite:  Good  Current Medications:  Current Facility-Administered Medications  Medication Dose Route Frequency Provider Last Rate Last Admin   acetaminophen (TYLENOL) tablet 650 mg  650 mg Oral Q6H PRN Rankin, Shuvon B, NP   650 mg at 08/22/22 0920   alum & mag hydroxide-simeth (MAALOX/MYLANTA) 200-200-20 MG/5ML suspension 30 mL  30 mL Oral Q4H PRN Rankin, Shuvon B, NP       ARIPiprazole (ABILIFY) tablet 10 mg  10 mg Oral Daily Rankin, Shuvon B, NP   10 mg at 08/23/22 2956   chlordiazePOXIDE (LIBRIUM) capsule 25 mg  25 mg Oral Q6H PRN Rankin, Shuvon B, NP       chlordiazePOXIDE (LIBRIUM) capsule 25 mg  25 mg Oral BH-qamhs Rankin, Shuvon B, NP   25 mg at 08/23/22 2130   Followed by   Melene Muller ON 08/24/2022] chlordiazePOXIDE (LIBRIUM) capsule 25 mg  25 mg Oral Daily Rankin, Shuvon B, NP       escitalopram (LEXAPRO) tablet 10 mg  10 mg Oral Daily Rankin, Shuvon B,  NP   10 mg at 08/23/22 0927   fluticasone (FLONASE) 50 MCG/ACT nasal spray 1 spray  1 spray Each Nare Daily Carlyn Reichert, MD   1 spray at 08/23/22 0930   hydrOXYzine (ATARAX) tablet 25 mg  25 mg Oral Q6H PRN Rankin, Shuvon B, NP   25 mg at 08/21/22 2127   loperamide (IMODIUM) capsule 2-4 mg  2-4 mg Oral PRN Carlyn Reichert, MD  magnesium hydroxide (MILK OF MAGNESIA) suspension 30 mL  30 mL Oral Daily PRN Rankin, Shuvon B, NP       multivitamin with minerals tablet 1 tablet  1 tablet Oral Daily Rankin, Shuvon B, NP   1 tablet at 08/23/22 0932   nicotine (NICODERM CQ - dosed in mg/24 hours) patch 21 mg  21 mg Transdermal Daily Rankin, Shuvon B, NP   21 mg at 08/23/22 0927   ondansetron (ZOFRAN-ODT) disintegrating tablet 4 mg  4 mg Oral Q6H PRN Rankin, Shuvon B, NP   4 mg at 08/21/22 2127   rivaroxaban (XARELTO) tablet 20 mg  20 mg Oral Daily Rankin, Shuvon B, NP   20 mg at 08/22/22 0930   thiamine (VITAMIN B1) tablet 100 mg  100 mg Oral Daily Carlyn Reichert, MD   100 mg at 08/23/22 3557   traZODone (DESYREL) tablet 50 mg  50 mg Oral QHS PRN Rankin, Shuvon B, NP   50 mg at 08/22/22 2132   Current Outpatient Medications  Medication Sig Dispense Refill   ARIPiprazole (ABILIFY) 10 MG tablet Take 1 tablet (10 mg total) by mouth daily. 30 tablet 0   escitalopram (LEXAPRO) 10 MG tablet Take 1 tablet (10 mg total) by mouth daily. 30 tablet 0   hydrOXYzine (ATARAX) 25 MG tablet Take 1 tablet (25 mg total) by mouth 3 (three) times daily as needed for anxiety. 30 tablet 0   RIVAROXABAN (XARELTO) VTE STARTER PACK (15 & 20 MG) Follow package directions: Take one 15mg  tablet by mouth twice a day. On day 22, switch to one 20mg  tablet once a day. Take with food. (Patient taking differently: Take 15-20 mg by mouth See admin instructions. Follow package directions: Take one 15mg  tablet by mouth twice a day. On day 22, switch to one 20mg  tablet once a day. Take with food.) 51 each 0    Labs  Lab Results:   Admission on 08/20/2022  Component Date Value Ref Range Status   SARS Coronavirus 2 by RT PCR 08/20/2022 NEGATIVE  NEGATIVE Final   Comment: (NOTE) SARS-CoV-2 target nucleic acids are NOT DETECTED.  The SARS-CoV-2 RNA is generally detectable in upper respiratory specimens during the acute phase of infection. The lowest concentration of SARS-CoV-2 viral copies this assay can detect is 138 copies/mL. A negative result does not preclude SARS-Cov-2 infection and should not be used as the sole basis for treatment or other patient management decisions. A negative result may occur with  improper specimen collection/handling, submission of specimen other than nasopharyngeal swab, presence of viral mutation(s) within the areas targeted by this assay, and inadequate number of viral copies(<138 copies/mL). A negative result must be combined with clinical observations, patient history, and epidemiological information. The expected result is Negative.  Fact Sheet for Patients:   Fact Sheet for Healthcare Providers:   This test is no                          t yet approved or cleared by the 08/22/2022 FDA and  has been authorized for detection and/or diagnosis of SARS-CoV-2 by FDA under an Emergency Use Authorization (EUA). This EUA will remain  in effect (meaning this test can be used) for the duration of the COVID-19 declaration under Section 564(b)(1) of the Act, 21 U.S.C.section 360bbb-3(b)(1), unless the authorization is terminated  or revoked sooner.       Influenza A by PCR 08/20/2022 NEGATIVE  NEGATIVE Final  Influenza B by PCR 08/20/2022 NEGATIVE  NEGATIVE Final   Comment: (NOTE) The Xpert Xpress SARS-CoV-2/FLU/RSV plus assay is intended as an aid in the diagnosis of influenza from Nasopharyngeal swab specimens and should not be used as a sole basis for treatment. Nasal washings  and aspirates are unacceptable for Xpert Xpress SARS-CoV-2/FLU/RSV testing.  Fact Sheet for Patients: BloggerCourse.com  Fact Sheet for Healthcare Providers: SeriousBroker.it  This test is not yet approved or cleared by the [place] FDA and has been authorized for detection and/or diagnosis of SARS-CoV-2 by FDA under an Emergency Use Authorization (EUA). This EUA will remain in effect (meaning this test can be used) for the duration of the COVID-19 declaration under Section 564(b)(1) of the Act, 21 U.S.C. section 360bbb-3(b)(1), unless the authorization is terminated or revoked.     Resp Syncytial Virus by PCR 08/20/2022 NEGATIVE  NEGATIVE Final   Comment: (NOTE) Fact Sheet for Patients: BloggerCourse.com  Fact Sheet for Healthcare Providers: SeriousBroker.it  This test is not yet approved or cleared by the [place] FDA and has been authorized for detection and/or diagnosis of SARS-CoV-2 by FDA under an Emergency Use Authorization (EUA). This EUA will remain in effect (meaning this test can be used) for the duration of the COVID-19 declaration under Section 564(b)(1) of the Act, 21 U.S.C. section 360bbb-3(b)(1), unless the authorization is terminated or revoked.  Performed at [facility], [address], [place], [place] [address]    WBC 08/20/2022 5.9  4.0 - 10.5 K/uL Final   RBC 08/20/2022 4.89  3.87 - 5.11 MIL/uL Final   Hemoglobin 08/20/2022 15.2 (H)  12.0 - 15.0 g/dL Final   HCT 60/45/4098 44.4  36.0 - 46.0 % Final   MCV 08/20/2022 90.8  80.0 - 100.0 fL Final   MCH 08/20/2022 31.1  26.0 - 34.0 pg Final   MCHC 08/20/2022 34.2  30.0 - 36.0 g/dL Final   RDW 11/91/4782 13.5  11.5 - 15.5 % Final   Platelets 08/20/2022 294  150 - 400 K/uL Final   nRBC 08/20/2022 0.0  0.0 - 0.2 % Final   Neutrophils Relative % 08/20/2022 54  % Final   Neutro Abs 08/20/2022  3.2  1.7 - 7.7 K/uL Final   Lymphocytes Relative 08/20/2022 34  % Final   Lymphs Abs 08/20/2022 2.0  0.7 - 4.0 K/uL Final   Monocytes Relative 08/20/2022 8  % Final   Monocytes Absolute 08/20/2022 0.5  0.1 - 1.0 K/uL Final   Eosinophils Relative 08/20/2022 2  % Final   Eosinophils Absolute 08/20/2022 0.1  0.0 - 0.5 K/uL Final   Basophils Relative 08/20/2022 1  % Final   Basophils Absolute 08/20/2022 0.1  0.0 - 0.1 K/uL Final   Immature Granulocytes 08/20/2022 1  % Final   Abs Immature Granulocytes 08/20/2022 0.04  0.00 - 0.07 K/uL Final   Performed at [facility], [address], [place], [place] [address]   Sodium 08/20/2022 140  135 - 145 mmol/L Final   Potassium 08/20/2022 3.9  3.5 - 5.1 mmol/L Final   Chloride 08/20/2022 105  98 - 111 mmol/L Final   CO2 08/20/2022 24  22 - 32 mmol/L Final   Glucose, Bld 08/20/2022 84  70 - 99 mg/dL Final   Glucose reference range applies only to samples taken after fasting for at least 8 hours.   BUN 08/20/2022 5 (L)  6 - 20 mg/dL Final   Creatinine, Ser 08/20/2022 0.60  0.44 - 1.00  mg/dL Final   Calcium 40/98/1191 9.3  8.9 - 10.3 mg/dL Final   Total Protein 47/82/9562 6.8  6.5 - 8.1 g/dL Final   Albumin 13/03/6577 3.9  3.5 - 5.0 g/dL Final   AST 46/96/2952 16  15 - 41 U/L Final   ALT 08/20/2022 15  0 - 44 U/L Final   Alkaline Phosphatase 08/20/2022 91  38 - 126 U/L Final   Total Bilirubin 08/20/2022 0.2 (L)  0.3 - 1.2 mg/dL Final   GFR, Estimated 08/20/2022 >60  >60 mL/min Final   Comment: (NOTE) Calculated using the CKD-EPI Creatinine Equation (2021)    Anion gap 08/20/2022 11  5 - 15 Final   Performed at [facility], [address] [place], [place] [address]   Hgb A1c MFr Bld 08/20/2022 5.4  4.8 - 5.6 % Final   Comment: (NOTE) Pre diabetes:          5.7%-6.4%  Diabetes:              >6.4%  Glycemic control for   <7.0% adults with diabetes    Mean Plasma Glucose 08/20/2022 108.28  mg/dL Final   Performed at [facility], [address], [place], [place] [address]   Magnesium 08/20/2022 1.9  1.7 - 2.4 mg/dL Final   Performed at [facility], [address], [place], [place] [address]   Alcohol, Ethyl (B) 08/20/2022 <10  <10 mg/dL Final   Comment: (NOTE) Lowest detectable limit for serum alcohol is 10 mg/dL.  For medical purposes only. Performed at [facility], [address] [place], [place] [address]    Cholesterol 08/20/2022 180  0 - 200 mg/dL Final   Triglycerides 34/74/2595 181 (H)  <150 mg/dL Final   HDL 63/87/5643 77  >40 mg/dL Final   Total CHOL/HDL Ratio 08/20/2022 2.3  RATIO Final   VLDL 08/20/2022 36  0 - 40 mg/dL Final   LDL Cholesterol 08/20/2022 67  0 - 99 mg/dL Final   Comment:        Total Cholesterol/HDL:CHD Risk Coronary Heart Disease Risk Table                     Men   Women  1/2 Average Risk   3.4   3.3  Average Risk       5.0   4.4  2 X Average Risk   9.6   7.1  3 X Average Risk  23.4   11.0        Use the calculated Patient Ratio above and the CHD Risk Table to determine the patient's CHD Risk.        ATP III CLASSIFICATION (LDL):  <100     mg/dL   Optimal  329-518  mg/dL   Near or Above                    Optimal  130-159  mg/dL   Borderline  841-660  mg/dL   High  >630     mg/dL   Very High Performed at [facility], [address] [place], [place] [address]    Color, Urine 08/20/2022 YELLOW  YELLOW Final   APPearance 08/20/2022 CLEAR  CLEAR Final   Specific Gravity, Urine 08/20/2022 1.020  1.005 - 1.030 Final   pH 08/20/2022 5.0  5.0 - 8.0 Final   Glucose, UA 08/20/2022 NEGATIVE  NEGATIVE mg/dL Final   Hgb urine dipstick 08/20/2022  NEGATIVE  NEGATIVE Final   Bilirubin Urine 08/20/2022 NEGATIVE  NEGATIVE Final   Ketones, ur 08/20/2022 NEGATIVE  NEGATIVE mg/dL Final   Protein, ur 52/77/8242 NEGATIVE  NEGATIVE mg/dL Final   Nitrite 35/36/1443 NEGATIVE  NEGATIVE Final   Leukocytes,Ua 08/20/2022 NEGATIVE  NEGATIVE Final   Performed at  [facility], [address] [place], [place] [address]   Preg Test, Ur 08/20/2022 Negative  Negative Final   POC Amphetamine UR 08/20/2022 None Detected  NONE DETECTED (Cut Off Level 1000 ng/mL) Final   POC Secobarbital (BAR) 08/20/2022 None Detected  NONE DETECTED (Cut Off Level 300 ng/mL) Final   POC Buprenorphine (BUP) 08/20/2022 None Detected  NONE DETECTED (Cut Off Level 10 ng/mL) Final   POC Oxazepam (BZO) 08/20/2022 None Detected  NONE DETECTED (Cut Off Level 300 ng/mL) Final   POC Cocaine UR 08/20/2022 None Detected  NONE DETECTED (Cut Off Level 300 ng/mL) Final   POC Methamphetamine UR 08/20/2022 Positive (A)  NONE DETECTED (Cut Off Level 1000 ng/mL) Final   POC Morphine 08/20/2022 None Detected  NONE DETECTED (Cut Off Level 300 ng/mL) Final   POC Methadone UR 08/20/2022 None Detected  NONE DETECTED (Cut Off Level 300 ng/mL) Final   POC Oxycodone UR 08/20/2022 None Detected  NONE DETECTED (Cut Off Level 100 ng/mL) Final   POC Marijuana UR 08/20/2022 None Detected  NONE DETECTED (Cut Off Level 50 ng/mL) Final   HIV Screen 4th Generation wRfx 08/20/2022 Non Reactive  Non Reactive Final   Performed at [facility], [address], [place], [place] [address]   SARSCOV2ONAVIRUS 2 AG 08/20/2022 NEGATIVE  NEGATIVE Final   Comment: (NOTE) SARS-CoV-2 antigen NOT DETECTED.   Negative results are presumptive.  Negative results do not preclude SARS-CoV-2 infection and should not be used as the sole basis for treatment or other patient management decisions, including infection  control decisions, particularly in the presence of clinical signs and  symptoms consistent with COVID-19, or in those who have been in contact with the virus.  Negative results must be combined with clinical observations, patient history, and epidemiological information. The expected result is Negative.  Fact Sheet for Patients: https://www.jennings-kim.com/  Fact Sheet for Healthcare  Providers: https://alexander-rogers.biz/  This test is not yet approved or cleared by the [place] FDA and  has been authorized for detection and/or diagnosis of SARS-CoV-2 by FDA under an Emergency Use Authorization (EUA).  This EUA will remain in effect (meaning this test can be used) for the duration of  the COV                          ID-19 declaration under Section 564(b)(1) of the Act, 21 U.S.C. section 360bbb-3(b)(1), unless the authorization is terminated or revoked sooner.     Preg Test, Ur 08/20/2022 NEGATIVE  NEGATIVE Final   Comment:        THE SENSITIVITY OF THIS METHODOLOGY IS >24 mIU/mL    SARS Coronavirus 2 by RT PCR 08/22/2022 NEGATIVE  NEGATIVE Final   Comment: (NOTE) SARS-CoV-2 target nucleic acids are NOT DETECTED.  The SARS-CoV-2 RNA is generally detectable in upper respiratory specimens during the acute phase of infection. The lowest concentration of SARS-CoV-2 viral copies this assay can detect is 138 copies/mL. A negative result does not preclude SARS-Cov-2 infection and should not be used as the sole basis for treatment or other patient management decisions. A negative result may occur with  improper specimen  collection/handling, submission of specimen other than nasopharyngeal swab, presence of viral mutation(s) within the areas targeted by this assay, and inadequate number of viral copies(<138 copies/mL). A negative result must be combined with clinical observations, patient history, and epidemiological information. The expected result is Negative.  Fact Sheet for Patients:  BloggerCourse.com  Fact Sheet for Healthcare Providers:  SeriousBroker.it  This test is no                          t yet approved or cleared by the [place] FDA and  has been authorized for detection and/or diagnosis of SARS-CoV-2 by FDA under an Emergency Use Authorization (EUA). This EUA will remain   in effect (meaning this test can be used) for the duration of the COVID-19 declaration under Section 564(b)(1) of the Act, 21 U.S.C.section 360bbb-3(b)(1), unless the authorization is terminated  or revoked sooner.       Influenza A by PCR 08/22/2022 NEGATIVE  NEGATIVE Final   Influenza B by PCR 08/22/2022 NEGATIVE  NEGATIVE Final   Comment: (NOTE) The Xpert Xpress SARS-CoV-2/FLU/RSV plus assay is intended as an aid in the diagnosis of influenza from Nasopharyngeal swab specimens and should not be used as a sole basis for treatment. Nasal washings and aspirates are unacceptable for Xpert Xpress SARS-CoV-2/FLU/RSV testing.  Fact Sheet for Patients: BloggerCourse.com  Fact Sheet for Healthcare Providers: SeriousBroker.it  This test is not yet approved or cleared by the [place] FDA and has been authorized for detection and/or diagnosis of SARS-CoV-2 by FDA under an Emergency Use Authorization (EUA). This EUA will remain in effect (meaning this test can be used) for the duration of the COVID-19 declaration under Section 564(b)(1) of the Act, 21 U.S.C. section 360bbb-3(b)(1), unless the authorization is terminated or revoked.     Resp Syncytial Virus by PCR 08/22/2022 NEGATIVE  NEGATIVE Final   Comment: (NOTE) Fact Sheet for Patients: BloggerCourse.com  Fact Sheet for Healthcare Providers: SeriousBroker.it  This test is not yet approved or cleared by the [place] FDA and has been authorized for detection and/or diagnosis of SARS-CoV-2 by FDA under an Emergency Use Authorization (EUA). This EUA will remain in effect (meaning this test can be used) for the duration of the COVID-19 declaration under Section 564(b)(1) of the Act, 21 U.S.C. section 360bbb-3(b)(1), unless the authorization is terminated or revoked.  Performed at [facility], [address]  [place], [place] [address]   Admission on 07/22/2022, Discharged on 07/23/2022  Component Date Value Ref Range Status   SARS Coronavirus 2 by RT PCR 07/22/2022 NEGATIVE  NEGATIVE Final   Comment: (NOTE) SARS-CoV-2 target nucleic acids are NOT DETECTED.  The SARS-CoV-2 RNA is generally detectable in upper respiratory specimens during the acute phase of infection. The lowest concentration of SARS-CoV-2 viral copies this assay can detect is 138 copies/mL. A negative result does not preclude SARS-Cov-2 infection and should not be used as the sole basis for treatment or other patient management decisions. A negative result may occur with  improper specimen collection/handling, submission of specimen other than nasopharyngeal swab, presence of viral mutation(s) within the areas targeted by this assay, and inadequate number of viral copies(<138 copies/mL). A negative result must be combined with clinical observations, patient history, and epidemiological information. The expected result is Negative.  Fact Sheet for Patients:  BloggerCourse.com  Fact Sheet for Healthcare Providers:  SeriousBroker.it  This test is no  t yet approved or cleared by the [place] FDA and  has been authorized for detection and/or diagnosis of SARS-CoV-2 by FDA under an Emergency Use Authorization (EUA). This EUA will remain  in effect (meaning this test can be used) for the duration of the COVID-19 declaration under Section 564(b)(1) of the Act, 21 U.S.C.section 360bbb-3(b)(1), unless the authorization is terminated  or revoked sooner.       Influenza A by PCR 07/22/2022 NEGATIVE  NEGATIVE Final   Influenza B by PCR 07/22/2022 NEGATIVE  NEGATIVE Final   Comment: (NOTE) The Xpert Xpress SARS-CoV-2/FLU/RSV plus assay is intended as an aid in the diagnosis of influenza from Nasopharyngeal swab specimens and should not be used as a sole  basis for treatment. Nasal washings and aspirates are unacceptable for Xpert Xpress SARS-CoV-2/FLU/RSV testing.  Fact Sheet for Patients: BloggerCourse.comhttps://www.fda.gov/media/152166/download  Fact Sheet for Healthcare Providers: SeriousBroker.ithttps://www.fda.gov/media/152162/download  This test is not yet approved or cleared by the [place] FDA and has been authorized for detection and/or diagnosis of SARS-CoV-2 by FDA under an Emergency Use Authorization (EUA). This EUA will remain in effect (meaning this test can be used) for the duration of the COVID-19 declaration under Section 564(b)(1) of the Act, 21 U.S.C. section 360bbb-3(b)(1), unless the authorization is terminated or revoked.     Resp Syncytial Virus by PCR 07/22/2022 NEGATIVE  NEGATIVE Final   Comment: (NOTE) Fact Sheet for Patients: BloggerCourse.comhttps://www.fda.gov/media/152166/download  Fact Sheet for Healthcare Providers: SeriousBroker.ithttps://www.fda.gov/media/152162/download  This test is not yet approved or cleared by the [place] FDA and has been authorized for detection and/or diagnosis of SARS-CoV-2 by FDA under an Emergency Use Authorization (EUA). This EUA will remain in effect (meaning this test can be used) for the duration of the COVID-19 declaration under Section 564(b)(1) of the Act, 21 U.S.C. section 360bbb-3(b)(1), unless the authorization is terminated or revoked.  Performed at [facility], [address] [place], [place] [address]    Sodium 07/22/2022 136  135 - 145 mmol/L Final   Potassium 07/22/2022 4.5  3.5 - 5.1 mmol/L Final   Chloride 07/22/2022 106  98 - 111 mmol/L Final   CO2 07/22/2022 22  22 - 32 mmol/L Final   Glucose, Bld 07/22/2022 87  70 - 99 mg/dL Final   Glucose reference range applies only to samples taken after fasting for at least 8 hours.   BUN 07/22/2022 18  6 - 20 mg/dL Final   Creatinine, Ser 07/22/2022 0.61  0.44 - 1.00 mg/dL Final   Calcium 19/14/782912/19/2023 9.3  8.9 - 10.3 mg/dL Final   Total Protein  07/22/2022 7.1  6.5 - 8.1 g/dL Final   Albumin 56/21/308612/19/2023 4.0  3.5 - 5.0 g/dL Final   AST 57/84/696212/19/2023 21  15 - 41 U/L Final   ALT 07/22/2022 18  0 - 44 U/L Final   Alkaline Phosphatase 07/22/2022 67  38 - 126 U/L Final   Total Bilirubin 07/22/2022 0.1 (L)  0.3 - 1.2 mg/dL Final   GFR, Estimated 07/22/2022 >60  >60 mL/min Final   Comment: (NOTE) Calculated using the CKD-EPI Creatinine Equation (2021)    Anion gap 07/22/2022 8  5 - 15 Final   Performed at [facility], [address] [place], [place] [address]   WBC 07/22/2022 7.2  4.0 - 10.5 K/uL Final   RBC 07/22/2022 5.27 (H)  3.87 - 5.11 MIL/uL Final   Hemoglobin 07/22/2022 15.8 (H)  12.0 - 15.0 g/dL Final   HCT 13/24/401012/19/2023 49.0 (  H)  36.0 - 46.0 % Final   MCV 07/22/2022 93.0  80.0 - 100.0 fL Final   MCH 07/22/2022 30.0  26.0 - 34.0 pg Final   MCHC 07/22/2022 32.2  30.0 - 36.0 g/dL Final   RDW 16/05/9603 13.7  11.5 - 15.5 % Final   Platelets 07/22/2022 276  150 - 400 K/uL Final   nRBC 07/22/2022 0.0  0.0 - 0.2 % Final   Performed at [facility], [address] [place], [place] [address]   I-stat hCG, quantitative 07/22/2022 <5.0  <5 mIU/mL Final   Comment 3 07/22/2022          Final   Comment:   GEST. AGE      CONC.  (mIU/mL)   <=1 WEEK        5 - 50     2 WEEKS       50 - 500     3 WEEKS       100 - 10,000     4 WEEKS     1,000 - 30,000        FEMALE AND NON-PREGNANT FEMALE:     LESS THAN 5 mIU/mL   Admission on 07/17/2022, Discharged on 07/22/2022  Component Date Value Ref Range Status   SARS Coronavirus 2 by RT PCR 07/17/2022 NEGATIVE  NEGATIVE Final   Comment: (NOTE) SARS-CoV-2 target nucleic acids are NOT DETECTED.  The SARS-CoV-2 RNA is generally detectable in upper respiratory specimens during the acute phase of infection. The lowest concentration of SARS-CoV-2 viral copies this assay can detect is 138 copies/mL. A negative result does not preclude SARS-Cov-2 infection and should not be used as the sole  basis for treatment or other patient management decisions. A negative result may occur with  improper specimen collection/handling, submission of specimen other than nasopharyngeal swab, presence of viral mutation(s) within the areas targeted by this assay, and inadequate number of viral copies(<138 copies/mL). A negative result must be combined with clinical observations, patient history, and epidemiological information. The expected result is Negative.  Fact Sheet for Patients:  BloggerCourse.com  Fact Sheet for Healthcare Providers:  SeriousBroker.it  This test is no                          t yet approved or cleared by the [place] FDA and  has been authorized for detection and/or diagnosis of SARS-CoV-2 by FDA under an Emergency Use Authorization (EUA). This EUA will remain  in effect (meaning this test can be used) for the duration of the COVID-19 declaration under Section 564(b)(1) of the Act, 21 U.S.C.section 360bbb-3(b)(1), unless the authorization is terminated  or revoked sooner.       Influenza A by PCR 07/17/2022 NEGATIVE  NEGATIVE Final   Influenza B by PCR 07/17/2022 NEGATIVE  NEGATIVE Final   Comment: (NOTE) The Xpert Xpress SARS-CoV-2/FLU/RSV plus assay is intended as an aid in the diagnosis of influenza from Nasopharyngeal swab specimens and should not be used as a sole basis for treatment. Nasal washings and aspirates are unacceptable for Xpert Xpress SARS-CoV-2/FLU/RSV testing.  Fact Sheet for Patients: BloggerCourse.com  Fact Sheet for Healthcare Providers: SeriousBroker.it  This test is not yet approved or cleared by the [place] FDA and has been authorized for detection and/or diagnosis of SARS-CoV-2 by FDA under an Emergency Use Authorization (EUA). This EUA will remain in effect (meaning this test can be used) for the duration of  the COVID-19 declaration under Section 564(b)(1) of the Act, 21 U.S.C. section 360bbb-3(b)(1), unless the authorization is terminated or revoked.     Resp Syncytial Virus by PCR 07/17/2022 NEGATIVE  NEGATIVE Final   Comment: (NOTE) Fact Sheet for Patients: BloggerCourse.com  Fact Sheet for Healthcare Providers: SeriousBroker.it  This test is not yet approved or cleared by the [place] FDA and has been authorized for detection and/or diagnosis of SARS-CoV-2 by FDA under an Emergency Use Authorization (EUA). This EUA will remain in effect (meaning this test can be used) for the duration of the COVID-19 declaration under Section 564(b)(1) of the Act, 21 U.S.C. section 360bbb-3(b)(1), unless the authorization is terminated or revoked.  Performed at [facility], [address] [place], [place] [address]    WBC 07/17/2022 8.9  4.0 - 10.5 K/uL Final   RBC 07/17/2022 4.64  3.87 - 5.11 MIL/uL Final   Hemoglobin 07/17/2022 14.2  12.0 - 15.0 g/dL Final   HCT 60/45/4098 41.6  36.0 - 46.0 % Final   MCV 07/17/2022 89.7  80.0 - 100.0 fL Final   MCH 07/17/2022 30.6  26.0 - 34.0 pg Final   MCHC 07/17/2022 34.1  30.0 - 36.0 g/dL Final   RDW 11/91/4782 13.7  11.5 - 15.5 % Final   Platelets 07/17/2022 306  150 - 400 K/uL Final   nRBC 07/17/2022 0.0  0.0 - 0.2 % Final   Neutrophils Relative % 07/17/2022 59  % Final   Neutro Abs 07/17/2022 5.2  1.7 - 7.7 K/uL Final   Lymphocytes Relative 07/17/2022 35  % Final   Lymphs Abs 07/17/2022 3.1  0.7 - 4.0 K/uL Final   Monocytes Relative 07/17/2022 5  % Final   Monocytes Absolute 07/17/2022 0.5  0.1 - 1.0 K/uL Final   Eosinophils Relative 07/17/2022 1  % Final   Eosinophils Absolute 07/17/2022 0.1  0.0 - 0.5 K/uL Final   Basophils Relative 07/17/2022 0  % Final   Basophils Absolute 07/17/2022 0.0  0.0 - 0.1 K/uL Final   Immature Granulocytes 07/17/2022 0  % Final   Abs Immature  Granulocytes 07/17/2022 0.04  0.00 - 0.07 K/uL Final   Performed at [facility], [address] [place], [place] [address]   Sodium 07/17/2022 139  135 - 145 mmol/L Final   Potassium 07/17/2022 3.7  3.5 - 5.1 mmol/L Final   Chloride 07/17/2022 104  98 - 111 mmol/L Final   CO2 07/17/2022 24  22 - 32 mmol/L Final   Glucose, Bld 07/17/2022 51 (L)  70 - 99 mg/dL Final   Glucose reference range applies only to samples taken after fasting for at least 8 hours.   BUN 07/17/2022 8  6 - 20 mg/dL Final   Creatinine, Ser 07/17/2022 0.69  0.44 - 1.00 mg/dL Final   Calcium 30/86/5784 8.8 (L)  8.9 - 10.3 mg/dL Final   Total Protein 69/62/9528 6.2 (L)  6.5 - 8.1 g/dL Final   Albumin 41/32/4401 3.6  3.5 - 5.0 g/dL Final   AST 02/72/5366 23  15 - 41 U/L Final   ALT 07/17/2022 19  0 - 44 U/L Final   Alkaline Phosphatase 07/17/2022 82  38 - 126 U/L Final   Total Bilirubin 07/17/2022 0.6  0.3 - 1.2 mg/dL Final   GFR, Estimated 07/17/2022 >60  >60 mL/min Final   Comment: (NOTE) Calculated using the CKD-EPI Creatinine Equation (2021)    Anion gap 07/17/2022 11  5 - 15 Final  Performed at [facility], [address], [place], [place] [address]   Alcohol, Ethyl (B) 07/17/2022 37 (H)  <10 mg/dL Final   Comment: (NOTE) Lowest detectable limit for serum alcohol is 10 mg/dL.  For medical purposes only. Performed at [facility], [address] [place], [place] [address]    Neisseria Gonorrhea 07/17/2022 Negative   Final   Chlamydia 07/17/2022 Positive (A)   Final   Comment 07/17/2022 Normal Reference Ranger Chlamydia - Negative   Final   Comment 07/17/2022 Normal Reference Range Neisseria Gonorrhea - Negative   Final   POC Amphetamine UR 07/17/2022 None Detected  NONE DETECTED (Cut Off Level 1000 ng/mL) Final   POC Secobarbital (BAR) 07/17/2022 None Detected  NONE DETECTED (Cut Off Level 300 ng/mL) Final   POC Buprenorphine (BUP) 07/17/2022 None Detected  NONE DETECTED (Cut Off Level  10 ng/mL) Final   POC Oxazepam (BZO) 07/17/2022 Positive (A)  NONE DETECTED (Cut Off Level 300 ng/mL) Final   POC Cocaine UR 07/17/2022 None Detected  NONE DETECTED (Cut Off Level 300 ng/mL) Final   POC Methamphetamine UR 07/17/2022 None Detected  NONE DETECTED (Cut Off Level 1000 ng/mL) Final   POC Morphine 07/17/2022 None Detected  NONE DETECTED (Cut Off Level 300 ng/mL) Final   POC Methadone UR 07/17/2022 None Detected  NONE DETECTED (Cut Off Level 300 ng/mL) Final   POC Oxycodone UR 07/17/2022 None Detected  NONE DETECTED (Cut Off Level 100 ng/mL) Final   POC Marijuana UR 07/17/2022 None Detected  NONE DETECTED (Cut Off Level 50 ng/mL) Final   Preg Test, Ur 07/17/2022 Negative  Negative Final   RPR Ser Ql 07/17/2022 NON REACTIVE  NON REACTIVE Final   Performed at [facility], [address], [place], [place] [address]   HIV Screen 4th Generation wRfx 07/17/2022 Non Reactive  Non Reactive Final   Performed at [facility], [address] [place], [place] [address]   SARSCOV2ONAVIRUS 2 AG 07/17/2022 NEGATIVE  NEGATIVE Final   Comment: (NOTE) SARS-CoV-2 antigen NOT DETECTED.   Negative results are presumptive.  Negative results do not preclude SARS-CoV-2 infection and should not be used as the sole basis for treatment or other patient management decisions, including infection  control decisions, particularly in the presence of clinical signs and  symptoms consistent with COVID-19, or in those who have been in contact with the virus.  Negative results must be combined with clinical observations, patient history, and epidemiological information. The expected result is Negative.  Fact Sheet for Patients: https://www.jennings-kim.com/  Fact Sheet for Healthcare Providers: https://alexander-rogers.biz/  This test is not yet approved or cleared by the [place] FDA and  has been authorized for detection and/or diagnosis of SARS-CoV-2 by FDA under  an Emergency Use Authorization (EUA).  This EUA will remain in effect (meaning this test can be used) for the duration of  the COV                          ID-19 declaration under Section 564(b)(1) of the Act, 21 U.S.C. section 360bbb-3(b)(1), unless the authorization is terminated or revoked sooner.     Preg Test, Ur 07/17/2022 NEGATIVE  NEGATIVE Final   Comment:        THE SENSITIVITY OF THIS METHODOLOGY IS >24 mIU/mL   Admission on 07/14/2022, Discharged on 07/14/2022  Component Date Value Ref Range Status   SARS Coronavirus 2 by RT PCR 07/14/2022  NEGATIVE  NEGATIVE Final   Comment: (NOTE) SARS-CoV-2 target nucleic acids are NOT DETECTED.  The SARS-CoV-2 RNA is generally detectable in upper respiratory specimens during the acute phase of infection. The lowest concentration of SARS-CoV-2 viral copies this assay can detect is 138 copies/mL. A negative result does not preclude SARS-Cov-2 infection and should not be used as the sole basis for treatment or other patient management decisions. A negative result may occur with  improper specimen collection/handling, submission of specimen other than nasopharyngeal swab, presence of viral mutation(s) within the areas targeted by this assay, and inadequate number of viral copies(<138 copies/mL). A negative result must be combined with clinical observations, patient history, and epidemiological information. The expected result is Negative.  Fact Sheet for Patients:  EntrepreneurPulse.com.au  Fact Sheet for Healthcare Providers:  IncredibleEmployment.be  This test is no                          t yet approved or cleared by the [place] FDA and  has been authorized for detection and/or diagnosis of SARS-CoV-2 by FDA under an Emergency Use Authorization (EUA). This EUA will remain  in effect (meaning this test can be used) for the duration of the COVID-19 declaration under Section 564(b)(1) of  the Act, 21 U.S.C.section 360bbb-3(b)(1), unless the authorization is terminated  or revoked sooner.       Influenza A by PCR 07/14/2022 NEGATIVE  NEGATIVE Final   Influenza B by PCR 07/14/2022 NEGATIVE  NEGATIVE Final   Comment: (NOTE) The Xpert Xpress SARS-CoV-2/FLU/RSV plus assay is intended as an aid in the diagnosis of influenza from Nasopharyngeal swab specimens and should not be used as a sole basis for treatment. Nasal washings and aspirates are unacceptable for Xpert Xpress SARS-CoV-2/FLU/RSV testing.  Fact Sheet for Patients: EntrepreneurPulse.com.au  Fact Sheet for Healthcare Providers: IncredibleEmployment.be  This test is not yet approved or cleared by the [place] FDA and has been authorized for detection and/or diagnosis of SARS-CoV-2 by FDA under an Emergency Use Authorization (EUA). This EUA will remain in effect (meaning this test can be used) for the duration of the COVID-19 declaration under Section 564(b)(1) of the Act, 21 U.S.C. section 360bbb-3(b)(1), unless the authorization is terminated or revoked.  Performed at [facility], [place] [address] [place], [place] [address]   Admission on 07/11/2022, Discharged on 07/11/2022  Component Date Value Ref Range Status   WBC 07/11/2022 9.7  4.0 - 10.5 K/uL Final   RBC 07/11/2022 5.26 (H)  3.87 - 5.11 MIL/uL Final   Hemoglobin 07/11/2022 15.9 (H)  12.0 - 15.0 g/dL Final   HCT 07/11/2022 48.3 (H)  36.0 - 46.0 % Final   MCV 07/11/2022 91.8  80.0 - 100.0 fL Final   MCH 07/11/2022 30.2  26.0 - 34.0 pg Final   MCHC 07/11/2022 32.9  30.0 - 36.0 g/dL Final   RDW 07/11/2022 13.5  11.5 - 15.5 % Final   Platelets 07/11/2022 282  150 - 400 K/uL Final   nRBC 07/11/2022 0.0  0.0 - 0.2 % Final   Neutrophils Relative % 07/11/2022 63  % Final   Neutro Abs 07/11/2022 6.1  1.7 - 7.7 K/uL Final   Lymphocytes Relative 07/11/2022 29  % Final   Lymphs Abs 07/11/2022 2.8   0.7 - 4.0 K/uL Final   Monocytes Relative 07/11/2022 7  % Final   Monocytes Absolute 07/11/2022 0.7  0.1 - 1.0 K/uL Final   Eosinophils  Relative 07/11/2022 1  % Final   Eosinophils Absolute 07/11/2022 0.1  0.0 - 0.5 K/uL Final   Basophils Relative 07/11/2022 0  % Final   Basophils Absolute 07/11/2022 0.0  0.0 - 0.1 K/uL Final   Immature Granulocytes 07/11/2022 0  % Final   Abs Immature Granulocytes 07/11/2022 0.03  0.00 - 0.07 K/uL Final   Performed at [facility], [address], [place], [place] [address]   Sodium 07/11/2022 139  135 - 145 mmol/L Final   Potassium 07/11/2022 3.8  3.5 - 5.1 mmol/L Final   Chloride 07/11/2022 105  98 - 111 mmol/L Final   CO2 07/11/2022 24  22 - 32 mmol/L Final   Glucose, Bld 07/11/2022 90  70 - 99 mg/dL Final   Glucose reference range applies only to samples taken after fasting for at least 8 hours.   BUN 07/11/2022 9  6 - 20 mg/dL Final   Creatinine, Ser 07/11/2022 0.57  0.44 - 1.00 mg/dL Final   Calcium 60/45/4098 8.5 (L)  8.9 - 10.3 mg/dL Final   Total Protein 11/91/4782 6.3 (L)  6.5 - 8.1 g/dL Final   Albumin 95/62/1308 3.6  3.5 - 5.0 g/dL Final   AST 65/78/4696 19  15 - 41 U/L Final   ALT 07/11/2022 16  0 - 44 U/L Final   Alkaline Phosphatase 07/11/2022 78  38 - 126 U/L Final   Total Bilirubin 07/11/2022 0.4  0.3 - 1.2 mg/dL Final   GFR, Estimated 07/11/2022 >60  >60 mL/min Final   Comment: (NOTE) Calculated using the CKD-EPI Creatinine Equation (2021)    Anion gap 07/11/2022 10  5 - 15 Final   Performed at [facility], [address] [place], [place] [address]   Troponin I (High Sensitivity) 07/11/2022 <2  <18 ng/L Final   Comment: (NOTE) Elevated high sensitivity troponin I (hsTnI) values and significant  changes across serial measurements may suggest ACS but many other  chronic and acute conditions are known to elevate hsTnI results.  Refer to the "Links" section for chest pain algorithms and additional   guidance. Performed at [facility], [address], [place], [place] [address]    Lipase 07/11/2022 35  11 - 51 U/L Final   Performed at [facility], [address] [place], [place] [address]   D-Dimer, Quant 07/11/2022 <0.27  0.00 - 0.50 ug/mL-FEU Final   Comment: (NOTE) At the manufacturer cut-off value of 0.5 g/mL FEU, this assay has a negative predictive value of 95-100%.This assay is intended for use in conjunction with a clinical pretest probability (PTP) assessment model to exclude pulmonary embolism (PE) and deep venous thrombosis (DVT) in outpatients suspected of PE or DVT. Results should be correlated with clinical presentation. Performed at [facility], [address] [place], [place] [address]    Color, Urine 07/11/2022 YELLOW  YELLOW Final   APPearance 07/11/2022 HAZY (A)  CLEAR Final   Specific Gravity, Urine 07/11/2022 1.024  1.005 - 1.030 Final   pH 07/11/2022 6.0  5.0 - 8.0 Final   Glucose, UA 07/11/2022 NEGATIVE  NEGATIVE mg/dL Final   Hgb urine dipstick 07/11/2022 NEGATIVE  NEGATIVE Final   Bilirubin Urine 07/11/2022 NEGATIVE  NEGATIVE Final   Ketones, ur 07/11/2022 NEGATIVE  NEGATIVE mg/dL Final   Protein, ur 64/40/3474 NEGATIVE  NEGATIVE mg/dL Final   Nitrite 25/95/6387 NEGATIVE  NEGATIVE Final   Leukocytes,Ua 07/11/2022 NEGATIVE  NEGATIVE Final   Performed at  [facility], [address], [place], [place] [address]   SARS Coronavirus 2 by RT PCR 07/11/2022 NEGATIVE  NEGATIVE Final   Comment: (NOTE) SARS-CoV-2 target nucleic acids are NOT DETECTED.  The SARS-CoV-2 RNA is generally detectable in upper respiratory specimens during the acute phase of infection. The lowest concentration of SARS-CoV-2 viral copies this assay can detect is 138 copies/mL. A negative result does not preclude SARS-Cov-2 infection and should not be used as the sole basis for treatment or other patient management decisions. A negative result may occur  with  improper specimen collection/handling, submission of specimen other than nasopharyngeal swab, presence of viral mutation(s) within the areas targeted by this assay, and inadequate number of viral copies(<138 copies/mL). A negative result must be combined with clinical observations, patient history, and epidemiological information. The expected result is Negative.  Fact Sheet for Patients:  BloggerCourse.com  Fact Sheet for Healthcare Providers:  SeriousBroker.it  This test is no                          t yet approved or cleared by the [place] FDA and  has been authorized for detection and/or diagnosis of SARS-CoV-2 by FDA under an Emergency Use Authorization (EUA). This EUA will remain  in effect (meaning this test can be used) for the duration of the COVID-19 declaration under Section 564(b)(1) of the Act, 21 U.S.C.section 360bbb-3(b)(1), unless the authorization is terminated  or revoked sooner.       Influenza A by PCR 07/11/2022 NEGATIVE  NEGATIVE Final   Influenza B by PCR 07/11/2022 NEGATIVE  NEGATIVE Final   Comment: (NOTE) The Xpert Xpress SARS-CoV-2/FLU/RSV plus assay is intended as an aid in the diagnosis of influenza from Nasopharyngeal swab specimens and should not be used as a sole basis for treatment. Nasal washings and aspirates are unacceptable for Xpert Xpress SARS-CoV-2/FLU/RSV testing.  Fact Sheet for Patients: BloggerCourse.com  Fact Sheet for Healthcare Providers: SeriousBroker.it  This test is not yet approved or cleared by the [place] FDA and has been authorized for detection and/or diagnosis of SARS-CoV-2 by FDA under an Emergency Use Authorization (EUA). This EUA will remain in effect (meaning this test can be used) for the duration of the COVID-19 declaration under Section 564(b)(1) of the Act, 21 U.S.C. section 360bbb-3(b)(1),  unless the authorization is terminated or revoked.  Performed at [facility], [address] [place], [place] [address]    I-stat hCG, quantitative 07/11/2022 <5.0  <5 mIU/mL Final   Comment 3 07/11/2022          Final   Comment:   GEST. AGE      CONC.  (mIU/mL)   <=1 WEEK        5 - 50     2 WEEKS       50 - 500     3 WEEKS       100 - 10,000     4 WEEKS     1,000 - 30,000        FEMALE AND NON-PREGNANT FEMALE:     LESS THAN 5 mIU/mL    Troponin I (High Sensitivity) 07/11/2022 2  <18 ng/L Final   Comment: (NOTE) Elevated high sensitivity troponin I (hsTnI) values and significant  changes across serial measurements may suggest ACS but many other  chronic and acute conditions are known to elevate hsTnI results.  Refer to the "Links" section for chest pain  algorithms and additional  guidance. Performed at [facility], [address] [place], [place] [address]   Admission on 07/02/2022, Discharged on 07/02/2022  Component Date Value Ref Range Status   Color, Urine 07/02/2022 YELLOW  YELLOW Final   APPearance 07/02/2022 HAZY (A)  CLEAR Final   Specific Gravity, Urine 07/02/2022 1.014  1.005 - 1.030 Final   pH 07/02/2022 5.0  5.0 - 8.0 Final   Glucose, UA 07/02/2022 NEGATIVE  NEGATIVE mg/dL Final   Hgb urine dipstick 07/02/2022 SMALL (A)  NEGATIVE Final   Bilirubin Urine 07/02/2022 NEGATIVE  NEGATIVE Final   Ketones, ur 07/02/2022 NEGATIVE  NEGATIVE mg/dL Final   Protein, ur 91/47/8295 NEGATIVE  NEGATIVE mg/dL Final   Nitrite 62/13/0865 POSITIVE (A)  NEGATIVE Final   Leukocytes,Ua 07/02/2022 LARGE (A)  NEGATIVE Final   RBC / HPF 07/02/2022 6-10  0 - 5 RBC/hpf Final   WBC, UA 07/02/2022 >50 (H)  0 - 5 WBC/hpf Final   Bacteria, UA 07/02/2022 FEW (A)  NONE SEEN Final   Squamous Epithelial / HPF 07/02/2022 6-10  0 - 5 Final   Mucus 07/02/2022 PRESENT   Final   Non Squamous Epithelial 07/02/2022 0-5 (A)  NONE SEEN Final   Performed at [facility],  [address], [place], [place] [address]   WBC 07/02/2022 9.9  4.0 - 10.5 K/uL Final   RBC 07/02/2022 5.18 (H)  3.87 - 5.11 MIL/uL Final   Hemoglobin 07/02/2022 15.8 (H)  12.0 - 15.0 g/dL Final   HCT 62/95/2841 48.5 (H)  36.0 - 46.0 % Final   MCV 07/02/2022 93.6  80.0 - 100.0 fL Final   MCH 07/02/2022 30.5  26.0 - 34.0 pg Final   MCHC 07/02/2022 32.6  30.0 - 36.0 g/dL Final   RDW 32/44/0102 13.5  11.5 - 15.5 % Final   Platelets 07/02/2022 249  150 - 400 K/uL Final   nRBC 07/02/2022 0.0  0.0 - 0.2 % Final   Performed at [facility], [address], [place], [place] [address]   Sodium 07/02/2022 138  135 - 145 mmol/L Final   Potassium 07/02/2022 3.9  3.5 - 5.1 mmol/L Final   Chloride 07/02/2022 105  98 - 111 mmol/L Final   CO2 07/02/2022 26  22 - 32 mmol/L Final   Glucose, Bld 07/02/2022 114 (H)  70 - 99 mg/dL Final   Glucose reference range applies only to samples taken after fasting for at least 8 hours.   BUN 07/02/2022 9  6 - 20 mg/dL Final   Creatinine, Ser 07/02/2022 0.64  0.44 - 1.00 mg/dL Final   Calcium 64/40/3474 8.9  8.9 - 10.3 mg/dL Final   GFR, Estimated 07/02/2022 >60  >60 mL/min Final   Comment: (NOTE) Calculated using the CKD-EPI Creatinine Equation (2021)    Anion gap 07/02/2022 7  5 - 15 Final   Performed at [facility], [address], [place], [place] [address]   Lipase 07/02/2022 35  11 - 51 U/L Final   Performed at [facility], [address] [place], [place] [address]   Total Protein 07/02/2022 6.9  6.5 - 8.1 g/dL Final   Albumin 43/32/9518 3.8  3.5 - 5.0 g/dL Final   AST 84/16/6063 17  15 - 41 U/L Final   ALT 07/02/2022 16  0 - 44 U/L Final   Alkaline Phosphatase 07/02/2022 91  38 - 126 U/L Final   Total Bilirubin 07/02/2022 0.9  0.3 - 1.2 mg/dL Final   Bilirubin, Direct 07/02/2022 0.1  0.0 - 0.2 mg/dL Final   Indirect Bilirubin 07/02/2022 0.8  0.3 - 0.9 mg/dL Final   Performed at [facility], [address] [place], [place] [address]  Admission on 06/06/2022, Discharged on 06/06/2022  Component Date Value Ref Range Status   Sodium 06/06/2022 140  135 - 145 mmol/L Final   Potassium 06/06/2022 3.2 (L)  3.5 - 5.1 mmol/L Final   Chloride 06/06/2022 103  98 - 111 mmol/L Final   CO2 06/06/2022 26  22 - 32 mmol/L Final   Glucose, Bld 06/06/2022 97  70 - 99 mg/dL Final   Glucose reference range applies only to samples taken after fasting for at least 8 hours.   BUN 06/06/2022 6  6 - 20 mg/dL Final   Creatinine, Ser 06/06/2022 0.54  0.44 - 1.00 mg/dL Final   Calcium 60/45/409811/10/2021 9.0  8.9 - 10.3 mg/dL Final   Total Protein 11/91/478211/10/2021 6.5  6.5 - 8.1 g/dL Final   Albumin 95/62/130811/10/2021 3.6  3.5 - 5.0 g/dL Final   AST 65/78/469611/10/2021 20  15 - 41 U/L Final   ALT 06/06/2022 17  0 - 44 U/L Final   Alkaline Phosphatase 06/06/2022 111  38 - 126 U/L Final   Total Bilirubin 06/06/2022 0.3  0.3 - 1.2 mg/dL Final   GFR, Estimated 06/06/2022 >60  >60 mL/min Final   Comment: (NOTE) Calculated using the CKD-EPI Creatinine Equation (2021)    Anion gap 06/06/2022 11  5 - 15 Final   Performed at [facility], [address] [place], [place] [address]   Alcohol, Ethyl (B) 06/06/2022 18 (H)  <10 mg/dL Final   Comment: (NOTE) Lowest detectable limit for serum alcohol is 10 mg/dL.  For medical purposes only. Performed at [facility], [address] [place], [place] [address]    WBC 06/06/2022 6.0  4.0 - 10.5 K/uL Final   RBC 06/06/2022 4.76  3.87 - 5.11 MIL/uL Final   Hemoglobin 06/06/2022 14.5  12.0 - 15.0 g/dL Final   HCT 40/10/272511/10/2021 43.9  36.0 - 46.0 % Final   MCV 06/06/2022 92.2  80.0 - 100.0 fL Final   MCH 06/06/2022 30.5  26.0 - 34.0 pg Final   MCHC 06/06/2022 33.0  30.0 - 36.0 g/dL Final   RDW 36/64/403411/10/2021 13.5  11.5 - 15.5 % Final   Platelets 06/06/2022 224  150 - 400 K/uL Final   nRBC 06/06/2022 0.0  0.0 - 0.2 % Final   Performed at [facility], [address] [place], [place] [address]   I-stat hCG, quantitative 06/06/2022 <5.0  <5 mIU/mL Final   Comment 3 06/06/2022          Final   Comment:   GEST. AGE      CONC.  (mIU/mL)   <=1 WEEK        5 - 50     2 WEEKS       50 - 500     3 WEEKS       100 - 10,000     4 WEEKS     1,000 - 30,000        FEMALE AND NON-PREGNANT FEMALE:     LESS THAN 5 mIU/mL   Admission on 06/05/2022, Discharged on 06/05/2022  Component Date Value Ref Range Status   Sodium 06/05/2022 139  135 - 145 mmol/L Final   Potassium  06/05/2022 3.7  3.5 - 5.1 mmol/L Final   Chloride 06/05/2022 105  98 - 111 mmol/L Final   CO2 06/05/2022 26  22 - 32 mmol/L Final   Glucose, Bld 06/05/2022 114 (H)  70 - 99 mg/dL Final   Glucose reference range applies only to samples taken after fasting for at least 8 hours.   BUN 06/05/2022 8  6 - 20 mg/dL Final   Creatinine, Ser 06/05/2022 0.50  0.44 - 1.00 mg/dL Final   Calcium 16/05/9603 8.8 (L)  8.9 - 10.3 mg/dL Final   Total Protein 54/04/8118 7.1  6.5 - 8.1 g/dL Final   Albumin 14/78/2956 3.9  3.5 - 5.0 g/dL Final   AST 21/30/8657 20  15 - 41 U/L Final   ALT 06/05/2022 17  0 - 44 U/L Final   Alkaline Phosphatase 06/05/2022 120  38 - 126 U/L Final   Total Bilirubin 06/05/2022 0.5  0.3 - 1.2 mg/dL Final   GFR, Estimated 06/05/2022 >60  >60 mL/min Final   Comment: (NOTE) Calculated using the CKD-EPI Creatinine Equation (2021)    Anion gap 06/05/2022 8  5 - 15 Final   Performed at [facility], [address] [place], [place] [address]   WBC 06/05/2022 4.9  4.0 - 10.5 K/uL Final   RBC 06/05/2022 5.00  3.87 - 5.11 MIL/uL Final   Hemoglobin 06/05/2022 15.1 (H)  12.0 - 15.0 g/dL Final   HCT 29/52/8413 46.0  36.0 - 46.0 % Final   MCV 06/05/2022 92.0  80.0 - 100.0 fL Final   MCH 06/05/2022 30.2  26.0 - 34.0 pg Final   MCHC 06/05/2022 32.8  30.0 - 36.0 g/dL Final   RDW 24/40/1027 13.6  11.5 - 15.5 % Final   Platelets 06/05/2022 248  150 - 400 K/uL Final   nRBC  06/05/2022 0.0  0.0 - 0.2 % Final   Performed at [facility], [address], [place], [place] [address]   Opiates 06/05/2022 NONE DETECTED  NONE DETECTED Final   Cocaine 06/05/2022 NONE DETECTED  NONE DETECTED Final   Benzodiazepines 06/05/2022 NONE DETECTED  NONE DETECTED Final   Amphetamines 06/05/2022 POSITIVE (A)  NONE DETECTED Final   Tetrahydrocannabinol 06/05/2022 NONE DETECTED  NONE DETECTED Final   Barbiturates 06/05/2022 NONE DETECTED  NONE DETECTED Final   Comment: (NOTE) DRUG SCREEN FOR MEDICAL PURPOSES ONLY.  IF CONFIRMATION IS NEEDED FOR ANY PURPOSE, NOTIFY LAB WITHIN 5 DAYS.  LOWEST DETECTABLE LIMITS FOR URINE DRUG SCREEN Drug Class                     Cutoff (ng/mL) Amphetamine and metabolites    1000 Barbiturate and metabolites    200 Benzodiazepine                 200 Opiates and metabolites        300 Cocaine and metabolites        300 THC                            50 Performed at [facility], [address] [place], [place] [address]    Alcohol, Ethyl (B) 06/05/2022 <10  <10 mg/dL Final   Comment: (NOTE) Lowest detectable limit for serum alcohol is 10 mg/dL.  For medical purposes only. Performed at [facility], [address], [place], [place] [address]   Admission on 05/10/2022, Discharged on  05/10/2022  Component Date Value Ref Range Status   Opiates 05/10/2022 NONE DETECTED  NONE DETECTED Final   Cocaine 05/10/2022 POSITIVE (A)  NONE DETECTED Final   Benzodiazepines 05/10/2022 NONE DETECTED  NONE DETECTED Final   Amphetamines 05/10/2022 NONE DETECTED  NONE DETECTED Final   Tetrahydrocannabinol 05/10/2022 NONE DETECTED  NONE DETECTED Final   Barbiturates 05/10/2022 NONE DETECTED  NONE DETECTED Final   Comment: (NOTE) DRUG SCREEN FOR MEDICAL PURPOSES ONLY.  IF CONFIRMATION IS NEEDED FOR ANY PURPOSE, NOTIFY LAB WITHIN 5 DAYS.  LOWEST DETECTABLE LIMITS FOR URINE DRUG SCREEN Drug Class                      Cutoff (ng/mL) Amphetamine and metabolites    1000 Barbiturate and metabolites    200 Benzodiazepine                 200 Tricyclics and metabolites     300 Opiates and metabolites        300 Cocaine and metabolites        300 THC                            50 Performed at [facility], [address], [place], [place] [address]    Alcohol, Ethyl (B) 05/10/2022 <10  <10 mg/dL Final   Comment: (NOTE) Lowest detectable limit for serum alcohol is 10 mg/dL.  For medical purposes only. Performed at [facility], [address], [place], [place] [address]    Sodium 05/10/2022 137  135 - 145 mmol/L Final   Potassium 05/10/2022 4.2  3.5 - 5.1 mmol/L Final   Chloride 05/10/2022 102  98 - 111 mmol/L Final   CO2 05/10/2022 29  22 - 32 mmol/L Final   Glucose, Bld 05/10/2022 90  70 - 99 mg/dL Final   Glucose reference range applies only to samples taken after fasting for at least 8 hours.   BUN 05/10/2022 11  6 - 20 mg/dL Final   Creatinine, Ser 05/10/2022 0.57  0.44 - 1.00 mg/dL Final   Calcium 09/81/1914 8.7 (L)  8.9 - 10.3 mg/dL Final   Total Protein 78/29/5621 7.2  6.5 - 8.1 g/dL Final   Albumin 30/86/5784 3.7  3.5 - 5.0 g/dL Final   AST 69/62/9528 18  15 - 41 U/L Final   ALT 05/10/2022 16  0 - 44 U/L Final   Alkaline Phosphatase 05/10/2022 97  38 - 126 U/L Final   Total Bilirubin 05/10/2022 0.5  0.3 - 1.2 mg/dL Final   GFR, Estimated 05/10/2022 >60  >60 mL/min Final   Comment: (NOTE) Calculated using the CKD-EPI Creatinine Equation (2021)    Anion gap 05/10/2022 6  5 - 15 Final   Performed at [facility], [address] [place], [place] [address]   WBC 05/10/2022 6.4  4.0 - 10.5 K/uL Final   RBC 05/10/2022 5.56 (H)  3.87 - 5.11 MIL/uL Final   Hemoglobin 05/10/2022 16.3 (H)  12.0 - 15.0 g/dL Final   HCT 40/05/2724 50.7 (H)  36.0 - 46.0 % Final   MCV 05/10/2022 91.2  80.0 - 100.0 fL Final   MCH 05/10/2022  29.3  26.0 - 34.0 pg Final   MCHC 05/10/2022 32.1  30.0 - 36.0 g/dL Final   RDW 36/64/4034 13.2  11.5 - 15.5 % Final   Platelets 05/10/2022 250  150 - 400 K/uL Final  nRBC 05/10/2022 0.0  0.0 - 0.2 % Final   Neutrophils Relative % 05/10/2022 49  % Final   Neutro Abs 05/10/2022 3.2  1.7 - 7.7 K/uL Final   Lymphocytes Relative 05/10/2022 40  % Final   Lymphs Abs 05/10/2022 2.6  0.7 - 4.0 K/uL Final   Monocytes Relative 05/10/2022 8  % Final   Monocytes Absolute 05/10/2022 0.5  0.1 - 1.0 K/uL Final   Eosinophils Relative 05/10/2022 1  % Final   Eosinophils Absolute 05/10/2022 0.0  0.0 - 0.5 K/uL Final   Basophils Relative 05/10/2022 1  % Final   Basophils Absolute 05/10/2022 0.0  0.0 - 0.1 K/uL Final   Immature Granulocytes 05/10/2022 1  % Final   Abs Immature Granulocytes 05/10/2022 0.04  0.00 - 0.07 K/uL Final   Performed at [facility], [place] [address], [place], [place] [address]   Lipase 05/10/2022 34  11 - 51 U/L Final   Performed at [facility], [place] [address], [place], [place] [address]  Admission on 05/08/2022, Discharged on 05/09/2022  Component Date Value Ref Range Status   SARS Coronavirus 2 by RT PCR 05/08/2022 NEGATIVE  NEGATIVE Final   Comment: (NOTE) SARS-CoV-2 target nucleic acids are NOT DETECTED.  The SARS-CoV-2 RNA is generally detectable in upper respiratory specimens during the acute phase of infection. The lowest concentration of SARS-CoV-2 viral copies this assay can detect is 138 copies/mL. A negative result does not preclude SARS-Cov-2 infection and should not be used as the sole basis for treatment or other patient management decisions. A negative result may occur with  improper specimen collection/handling, submission of specimen other than nasopharyngeal swab, presence of viral mutation(s) within the areas targeted by this assay, and inadequate number of viral copies(<138 copies/mL). A negative result must be combined  with clinical observations, patient history, and epidemiological information. The expected result is Negative.  Fact Sheet for Patients:  EntrepreneurPulse.com.au  Fact Sheet for Healthcare Providers:  IncredibleEmployment.be  This test is no                          t yet approved or cleared by the [place] FDA and  has been authorized for detection and/or diagnosis of SARS-CoV-2 by FDA under an Emergency Use Authorization (EUA). This EUA will remain  in effect (meaning this test can be used) for the duration of the COVID-19 declaration under Section 564(b)(1) of the Act, 21 U.S.C.section 360bbb-3(b)(1), unless the authorization is terminated  or revoked sooner.       Influenza A by PCR 05/08/2022 NEGATIVE  NEGATIVE Final   Influenza B by PCR 05/08/2022 NEGATIVE  NEGATIVE Final   Comment: (NOTE) The Xpert Xpress SARS-CoV-2/FLU/RSV plus assay is intended as an aid in the diagnosis of influenza from Nasopharyngeal swab specimens and should not be used as a sole basis for treatment. Nasal washings and aspirates are unacceptable for Xpert Xpress SARS-CoV-2/FLU/RSV testing.  Fact Sheet for Patients: EntrepreneurPulse.com.au  Fact Sheet for Healthcare Providers: IncredibleEmployment.be  This test is not yet approved or cleared by the [place] FDA and has been authorized for detection and/or diagnosis of SARS-CoV-2 by FDA under an Emergency Use Authorization (EUA). This EUA will remain in effect (meaning this test can be used) for the duration of the COVID-19 declaration under Section 564(b)(1) of the Act, 21 U.S.C. section 360bbb-3(b)(1), unless the authorization is terminated or revoked.  Performed at [facility], [place]  [address] [place], [place] [address]    WBC 05/08/2022 6.5  4.0 - 10.5 K/uL Final   RBC 05/08/2022 5.36 (H)  3.87 - 5.11 MIL/uL Final   Hemoglobin 05/08/2022 16.1 (H)   12.0 - 15.0 g/dL Final   HCT 60/45/4098 47.9 (H)  36.0 - 46.0 % Final   MCV 05/08/2022 89.4  80.0 - 100.0 fL Final   MCH 05/08/2022 30.0  26.0 - 34.0 pg Final   MCHC 05/08/2022 33.6  30.0 - 36.0 g/dL Final   RDW 11/91/4782 13.2  11.5 - 15.5 % Final   Platelets 05/08/2022 237  150 - 400 K/uL Final   nRBC 05/08/2022 0.0  0.0 - 0.2 % Final   Neutrophils Relative % 05/08/2022 37  % Final   Neutro Abs 05/08/2022 2.4  1.7 - 7.7 K/uL Final   Lymphocytes Relative 05/08/2022 52  % Final   Lymphs Abs 05/08/2022 3.4  0.7 - 4.0 K/uL Final   Monocytes Relative 05/08/2022 8  % Final   Monocytes Absolute 05/08/2022 0.5  0.1 - 1.0 K/uL Final   Eosinophils Relative 05/08/2022 1  % Final   Eosinophils Absolute 05/08/2022 0.1  0.0 - 0.5 K/uL Final   Basophils Relative 05/08/2022 1  % Final   Basophils Absolute 05/08/2022 0.0  0.0 - 0.1 K/uL Final   Immature Granulocytes 05/08/2022 1  % Final   Abs Immature Granulocytes 05/08/2022 0.05  0.00 - 0.07 K/uL Final   Performed at [facility], [address] [place], [place] [address]   Sodium 05/08/2022 135  135 - 145 mmol/L Final   Potassium 05/08/2022 3.9  3.5 - 5.1 mmol/L Final   Chloride 05/08/2022 100  98 - 111 mmol/L Final   CO2 05/08/2022 27  22 - 32 mmol/L Final   Glucose, Bld 05/08/2022 93  70 - 99 mg/dL Final   Glucose reference range applies only to samples taken after fasting for at least 8 hours.   BUN 05/08/2022 8  6 - 20 mg/dL Final   Creatinine, Ser 05/08/2022 0.68  0.44 - 1.00 mg/dL Final   Calcium 30/86/5784 9.0  8.9 - 10.3 mg/dL Final   Total Protein 69/62/9528 6.6  6.5 - 8.1 g/dL Final   Albumin 41/32/4401 3.7  3.5 - 5.0 g/dL Final   AST 02/72/5366 17  15 - 41 U/L Final   ALT 05/08/2022 17  0 - 44 U/L Final   Alkaline Phosphatase 05/08/2022 111  38 - 126 U/L Final   Total Bilirubin 05/08/2022 0.6  0.3 - 1.2 mg/dL Final   GFR, Estimated 05/08/2022 >60  >60 mL/min Final   Comment: (NOTE) Calculated using the CKD-EPI Creatinine  Equation (2021)    Anion gap 05/08/2022 8  5 - 15 Final   Performed at [facility], [address] [place], [place] [address]   Hgb A1c MFr Bld 05/08/2022 4.9  4.8 - 5.6 % Final   Comment: (NOTE) Pre diabetes:          5.7%-6.4%  Diabetes:              >6.4%  Glycemic control for   <7.0% adults with diabetes    Mean Plasma Glucose 05/08/2022 93.93  mg/dL Final   Performed at [facility], [address] [place], [place] [address]   Preg Test, Ur 05/08/2022 NEGATIVE  NEGATIVE Final   Performed at [facility], [address] [place], [place] [address]   POC Amphetamine UR  05/08/2022 None Detected  NONE DETECTED (Cut Off Level 1000 ng/mL) Final   POC Secobarbital (BAR) 05/08/2022 None Detected  NONE DETECTED (Cut Off Level 300 ng/mL) Final   POC Buprenorphine (BUP) 05/08/2022 None Detected  NONE DETECTED (Cut Off Level 10 ng/mL) Final   POC Oxazepam (BZO) 05/08/2022 None Detected  NONE DETECTED (Cut Off Level 300 ng/mL) Final   POC Cocaine UR 05/08/2022 None Detected  NONE DETECTED (Cut Off Level 300 ng/mL) Final   POC Methamphetamine UR 05/08/2022 Positive (A)  NONE DETECTED (Cut Off Level 1000 ng/mL) Final   POC Morphine 05/08/2022 None Detected  NONE DETECTED (Cut Off Level 300 ng/mL) Final   POC Methadone UR 05/08/2022 None Detected  NONE DETECTED (Cut Off Level 300 ng/mL) Final   POC Oxycodone UR 05/08/2022 None Detected  NONE DETECTED (Cut Off Level 100 ng/mL) Final   POC Marijuana UR 05/08/2022 None Detected  NONE DETECTED (Cut Off Level 50 ng/mL) Final   SARSCOV2ONAVIRUS 2 AG 05/08/2022 NEGATIVE  NEGATIVE Final   Comment: (NOTE) SARS-CoV-2 antigen NOT DETECTED.   Negative results are presumptive.  Negative results do not preclude SARS-CoV-2 infection and should not be used as the sole basis for treatment or other patient management decisions, including infection  control decisions, particularly in the presence of clinical signs and  symptoms consistent  with COVID-19, or in those who have been in contact with the virus.  Negative results must be combined with clinical observations, patient history, and epidemiological information. The expected result is Negative.  Fact Sheet for Patients: https://www.jennings-kim.com/  Fact Sheet for Healthcare Providers: https://alexander-rogers.biz/  This test is not yet approved or cleared by the [place] FDA and  has been authorized for detection and/or diagnosis of SARS-CoV-2 by FDA under an Emergency Use Authorization (EUA).  This EUA will remain in effect (meaning this test can be used) for the duration of  the COV                          ID-19 declaration under Section 564(b)(1) of the Act, 21 U.S.C. section 360bbb-3(b)(1), unless the authorization is terminated or revoked sooner.     Preg Test, Ur 05/08/2022 NEGATIVE  NEGATIVE Final   Comment:        THE SENSITIVITY OF THIS METHODOLOGY IS >24 mIU/mL    Cholesterol 05/08/2022 187  0 - 200 mg/dL Final   Triglycerides 16/05/9603 117  <150 mg/dL Final   HDL 54/04/8118 67  >40 mg/dL Final   Total CHOL/HDL Ratio 05/08/2022 2.8  RATIO Final   VLDL 05/08/2022 23  0 - 40 mg/dL Final   LDL Cholesterol 05/08/2022 97  0 - 99 mg/dL Final   Comment:        Total Cholesterol/HDL:CHD Risk Coronary Heart Disease Risk Table                     Men   Women  1/2 Average Risk   3.4   3.3  Average Risk       5.0   4.4  2 X Average Risk   9.6   7.1  3 X Average Risk  23.4   11.0        Use the calculated Patient Ratio above and the CHD Risk Table to determine the patient's CHD Risk.        ATP III CLASSIFICATION (LDL):  <100     mg/dL   Optimal  147-829  mg/dL  Near or Above                    Optimal  130-159  mg/dL   Borderline  [id number]  mg/dL   High  >629>190     mg/dL   Very High Performed at [facility], [address] [place], [place] [address]    TSH 05/08/2022 3.264  0.350 - 4.500 uIU/mL Final    Comment: Performed by a 3rd Generation assay with a functional sensitivity of <=0.01 uIU/mL. Performed at [facility], [address] [place], [place] [address]   There may be more visits with results that are not included.    Blood Alcohol level:  Lab Results  Component Value Date   ETH <10 08/20/2022   ETH 37 (H) 07/17/2022    Metabolic Disorder Labs: Lab Results  Component Value Date   HGBA1C 5.4 08/20/2022   MPG 108.28 08/20/2022   MPG 93.93 05/08/2022   No results found for: "PROLACTIN" Lab Results  Component Value Date   CHOL 180 08/20/2022   TRIG 181 (H) 08/20/2022   HDL 77 08/20/2022   CHOLHDL 2.3 08/20/2022   VLDL 36 08/20/2022   LDLCALC 67 08/20/2022   LDLCALC 97 05/08/2022    Therapeutic Lab Levels: No results found for: "LITHIUM" No results found for: "VALPROATE" No results found for: "CBMZ"  Physical Findings   AIMS    Flowsheet Row ED to Hosp-Admission (Dis[facility]  AIMS Total Score 0      AUDIT    Flowsheet Row ED to Hosp-Admission (Discharged) [facility]  Alcohol Use Disorder Identification Test Final Score (AUDIT) 32      CAGE-AID    Flowsheet Row [facility]  CAGE-AID Score 1      GAD-7    Flowsheet Row [facility]  Total GAD-7 Score 20      PHQ2-9    Flowsheet Row [facility]  PHQ-2 Total Score 6 6 2 5   PHQ-9 Total Score 22 22 11 22       Flowsheet Row ED [facility] from [facility]  from [facility]  C-SSRS RISK CATEGORY No Risk Low Risk No Risk        Musculoskeletal  Strength & Muscle Tone: within normal limits Gait & Station: normal Patient leans: N/A  Psychiatric Specialty Exam  Presentation  General Appearance:  Appropriate for Environment; Casual  Eye Contact: Fair  Speech: Clear and Coherent  Speech Volume: Normal  Handedness: Right   Mood and Affect  Mood: Dysphoric  Affect: Congruent   Thought Process  Thought Processes: Linear  Descriptions of Associations:Intact  Orientation:Full (Time, Place and Person)  Thought Content:Logical  Diagnosis of Schizophrenia or Schizoaffective disorder in past: Yes  Duration of Psychotic Symptoms: Greater than six months   Hallucinations:Hallucinations: None  Ideas of Reference:Paranoia  Suicidal Thoughts:Suicidal Thoughts: No  Homicidal Thoughts:Homicidal Thoughts: No   Sensorium  Memory: Immediate Fair  Judgment: Impaired  Insight: Shallow  Executive Functions  Concentration: Fair  Attention Span: Fair  Recall: [employer] of Knowledge: Fair  Language: Fair   Psychomotor Activity  Psychomotor Activity: Psychomotor Activity: Decreased   Assets  Assets: Desire for Improvement; Resilience   Sleep  Sleep: Sleep: Good   No data recorded  Physical Exam  Physical Exam HENT:     Head: Normocephalic and atraumatic.  Pulmonary:     Effort: Pulmonary effort is normal.  Neurological:     Mental Status: She is alert and oriented to person, place, and time.    Review of Systems  Psychiatric/Behavioral:  Positive for depression. Negative for hallucinations and suicidal ideas. The patient does not have insomnia.    Blood pressure 114/74, pulse 79, temperature (!) 97.4 F (36.3 C), temperature source Oral, resp. rate 16, height 5\' 3"  (1.6 m), weight 110 lb (49.9 kg), SpO2 100 %. Body mass index  is 19.49 kg/m.  Treatment Plan Summary: Daily contact with patient to assess and evaluate symptoms and progress in treatment and Medication management  Based on assessment there is some concern for malingering. Patient is homeless at baseline and has [facility] before and is fairly vague with her symptoms today. Patient also endorses the same things as her previous stays. Patient Etoh is at admission was < 10 which does not match what she stated during her assessment. However, patient also consistently comes with fairly low Etoh levels. Patient concern about her Xarelto is likely a barrier to her getting into a rehab program. However, EMR notes indicate that patient should be on lifelong Xarelto.   Stimulant use disorder, severe - Continue to monitor  Hx of bipolar disorder - Continue Abilify 10mg  daily - Continue Lexapro 10mg  daily  Hx of Etoh use disorder - Modified Librium taper starting at 25mg  BID - CIWA, last CIEA 1/20- "0"  Hx of saddle Pulm embolus and DVT - Continue Xarelto 20mg  - Consult to pharmacy about medication assistance  PGY-3 Bobbye Morton, MD 08/23/2022 12:08 PM

## 2022-08-23 NOTE — ED Notes (Signed)
Received patient this PM. Patient in her bed sleeping. Patient respirations are even and unlabored. Will continue to monitor for safety.  

## 2022-08-23 NOTE — ED Notes (Signed)
Pt resting in bed. A&O x4, calm and cooperative. Denies current SI/HI/AVH. No signs of distress noted. Monitoring for safety. 

## 2022-08-23 NOTE — ED Notes (Signed)
Awake and alert this morning.  Sitting in dayroom waiting for breakfast watching tv without issue or complaint.

## 2022-08-23 NOTE — Progress Notes (Signed)
Patient in dayroom eating lunch at this time.  No complaints or distress.  No evidence of withdrawal at this time.  Patient is calm and pleasant on approach.  Will monitor and provide a safe environment.

## 2022-08-24 DIAGNOSIS — Z1152 Encounter for screening for COVID-19: Secondary | ICD-10-CM | POA: Diagnosis not present

## 2022-08-24 DIAGNOSIS — Z5900 Homelessness unspecified: Secondary | ICD-10-CM | POA: Diagnosis not present

## 2022-08-24 DIAGNOSIS — F1094 Alcohol use, unspecified with alcohol-induced mood disorder: Secondary | ICD-10-CM | POA: Diagnosis not present

## 2022-08-24 DIAGNOSIS — F1910 Other psychoactive substance abuse, uncomplicated: Secondary | ICD-10-CM | POA: Diagnosis not present

## 2022-08-24 MED ORDER — BENZTROPINE MESYLATE 0.5 MG PO TABS
0.5000 mg | ORAL_TABLET | Freq: Every day | ORAL | Status: DC
  Administered 2022-08-24 – 2022-08-27 (×4): 0.5 mg via ORAL
  Filled 2022-08-24 (×4): qty 1
  Filled 2022-08-24: qty 14

## 2022-08-24 MED ORDER — HYDROXYZINE HCL 10 MG PO TABS
10.0000 mg | ORAL_TABLET | Freq: Three times a day (TID) | ORAL | Status: DC
  Administered 2022-08-24 – 2022-08-27 (×11): 10 mg via ORAL
  Filled 2022-08-24 (×6): qty 1
  Filled 2022-08-24: qty 42
  Filled 2022-08-24 (×4): qty 1
  Filled 2022-08-24: qty 15
  Filled 2022-08-24: qty 1

## 2022-08-24 NOTE — ED Notes (Signed)
Pt asleep in bed. Respirations even and unlabored. Monitoring for safety. 

## 2022-08-24 NOTE — ED Notes (Addendum)
Pt. Did not attend group tonight.

## 2022-08-24 NOTE — ED Notes (Signed)
Patient is asleep in bed without issue or complaint.  Patient remains dysphoric with sad mood and constricted affect.  Patient able to make needs known to staff.  Denies avh shi or plan.  Will continue to provide support and safety while on unit.  No evidence of withdrawal.

## 2022-08-24 NOTE — ED Notes (Signed)
Pt resting in bed. A&O x4, calm and cooperative. Denies current SI/HI/AVH. No signs of distress noted. Monitoring for safety. 

## 2022-08-24 NOTE — ED Provider Notes (Signed)
Behavioral Health Progress Note  Date and Time: 08/24/2022 2:17 PM Name: Kristina Owens MRN:  [id number]  Subjective:  The patient is a 57 year old female well-known to this service line. She has a history of cocaine, alcohol, and methamphetamine use disorders. She has been homeless for some time. [facility] The last 1 was in December, when she was [facility]. She reported to LCSW that she was denied because she did not have her Xarelto. On initial interview the patient appeared to have physical symptoms, possibly of methamphetamine withdrawal (the only substance for which she was positive in her UDS). Started on Librium taper due to concern for alcohol withdrawal.   ON assessment today patient reports that she does not feel well rested, but did get sleep. Patient reports that her mood is still not great and endorses significant anxiety. Patient reports that she has a restless feeling as well. Patient reports that she is hearing voices, the same 4 that she normally hears. Patient reports that the voices are bothering her now, because they are telling her that people are out to get her and that she needs to avoid interactions or places. Patient reports that this is why she prefers to keep to herself. Patient reports that she is also seeing things, including "faces ain chairs and food." Patient reports that this is scary for her. Patient denies SI and HI.  Patient ask provider about being on Haldol, despite having never been on this in her life. Patient reports she heard about it because her GMA who has schizophrenia took this. Patient also asks about Geodon because she has a cousin on this. Provider reports that while both Abilify and Haldol have LAI forms, that would be optimal form of treatment for patient, she is currently endorsing possible akathisia, which should be addressed first as the chances of this or other EPS  symptoms is higher with Haldol. Provider also spoke with patient about her poor compliance contributing to why she has AVH and paranoia.    Diagnosis:  Final diagnoses:  Alcohol use disorder, severe, dependence (HCC)  Alcohol-induced mood disorder with depressive symptoms (HCC)  Polysubstance abuse (HCC)  Homelessness    Total Time spent with patient: 20 minutes  Past Psychiatric History: Per above Past Medical History:  Past Medical History:  Diagnosis Date   Anxiety    Depression    DVT (deep vein thrombosis) in pregnancy    GERD (gastroesophageal reflux disease)    Ovarian cyst     Past Surgical History:  Procedure Laterality Date   APPENDECTOMY  age 57   IR PTA VENOUS EXCEPT DIALYSIS CIRCUIT  01/16/2021   IR RADIOLOGIST EVAL & MGMT  03/19/2021   IR THROMBECT VENO MECH MOD SED  01/16/2021   IR US GUIDE VASC ACCESS LEFT  01/16/2021   IR VENO/EXT/UNI LEFT  01/16/2021   IR VENOCAVAGRAM IVC  01/16/2021   TONSILLECTOMY Bilateral age 57   Family History:  Family History  Problem Relation Age of Onset   Asthma Mother    COPD Mother    Cancer Father        thyroid cancer   Family Psychiatric  History:  GMA: schizophrenia Cousin: ON Geodon Social History:  Social History   Substance and Sexual Activity  Alcohol Use Yes     Social History   Substance and Sexual Activity  Drug Use Yes   Types: "Crack" cocaine, Cocaine, Methamphetamines   Comment:  last used 4/31/22    Social History   Socioeconomic History   Marital status: Divorced    Spouse name: Not on file   Number of children: Not on file   Years of education: Not on file   Highest education level: Not on file  Occupational History   Not on file  Tobacco Use   Smoking status: Every Day    Packs/day: 0.50    Years: 35.00    Total pack years: 17.50    Types: Cigarettes   Smokeless tobacco: Never  Vaping Use   Vaping Use: Never used  Substance and Sexual Activity   Alcohol use: Yes   Drug use: Yes     Types: "Crack" cocaine, Cocaine, Methamphetamines    Comment: last used 4/31/22   Sexual activity: Not on file  Other Topics Concern   Not on file  Social History Narrative   Not on file   Social Determinants of Health   Financial Resource Strain: Not on file  Food Insecurity: Not on file  Transportation Needs: Not on file  Physical Activity: Not on file  Stress: Not on file  Social Connections: Not on file   SDOH:  SDOH Screenings   Alcohol Screen: High Risk (03/10/2022)  Depression (PHQ2-9): High Risk (08/24/2022)  Tobacco Use: High Risk (08/20/2022)   Additional Social History:    Pain Medications: See MAR Prescriptions: See MAR Over the Counter: See MAR History of alcohol / drug use?: Yes Longest period of sobriety (when/how long): 2 months Negative Consequences of Use: Legal Withdrawal Symptoms: Nausea / Vomiting, Tremors, Fever / Chills, Sweats, Other (Comment) (Headache) Name of Substance 1: Alcohol 1 - Age of First Use: 16 1 - Amount (size/oz): 1 pint 1 - Frequency: daily 1 - Duration: ongoing 1 - Last Use / Amount: 2 hours ago 1 - Method of Aquiring: purchase 1- Route of Use: drinking Name of Substance 2: Methamphetamine 2 - Age of First Use: 47 2 - Amount (size/oz): $20 worth 2 - Frequency: Weekly 2 - Duration: Ongoing 2 - Last Use / Amount: 5 days ago 2 - Method of Aquiring: UTA 2 - Route of Substance Use: intervenous                Sleep: Fair  Appetite:  Fair  Current Medications:  Current Facility-Administered Medications  Medication Dose Route Frequency Provider Last Rate Last Admin   acetaminophen (TYLENOL) tablet 650 mg  650 mg Oral Q6H PRN Rankin, Shuvon B, NP   650 mg at 08/22/22 0920   alum & mag hydroxide-simeth (MAALOX/MYLANTA) 200-200-20 MG/5ML suspension 30 mL  30 mL Oral Q4H PRN Rankin, Shuvon B, NP       ARIPiprazole (ABILIFY) tablet 10 mg  10 mg Oral Daily Rankin, Shuvon B, NP   10 mg at 08/24/22 0929   benztropine (COGENTIN)  tablet 0.5 mg  0.5 mg Oral Daily Eliseo Gum B, MD       escitalopram (LEXAPRO) tablet 10 mg  10 mg Oral Daily Rankin, Shuvon B, NP   10 mg at 08/24/22 0929   fluticasone (FLONASE) 50 MCG/ACT nasal spray 1 spray  1 spray Each Nare Daily Carlyn Reichert, MD   1 spray at 08/24/22 0929   hydrOXYzine (ATARAX) tablet 10 mg  10 mg Oral TID Bobbye Morton, MD       magnesium hydroxide (MILK OF MAGNESIA) suspension 30 mL  30 mL Oral Daily PRN Rankin, Shuvon B, NP  multivitamin with minerals tablet 1 tablet  1 tablet Oral Daily Rankin, Shuvon B, NP   1 tablet at 08/24/22 0929   nicotine (NICODERM CQ - dosed in mg/24 hours) patch 21 mg  21 mg Transdermal Daily Rankin, Shuvon B, NP   21 mg at 08/24/22 1610   rivaroxaban (XARELTO) tablet 20 mg  20 mg Oral Daily Rankin, Shuvon B, NP   20 mg at 08/24/22 9604   thiamine (VITAMIN B1) tablet 100 mg  100 mg Oral Daily Carlyn Reichert, MD   100 mg at 08/24/22 0929   traZODone (DESYREL) tablet 50 mg  50 mg Oral QHS PRN Rankin, Shuvon B, NP   50 mg at 08/22/22 2132   Current Outpatient Medications  Medication Sig Dispense Refill   ARIPiprazole (ABILIFY) 10 MG tablet Take 1 tablet (10 mg total) by mouth daily. 30 tablet 0   escitalopram (LEXAPRO) 10 MG tablet Take 1 tablet (10 mg total) by mouth daily. 30 tablet 0   hydrOXYzine (ATARAX) 25 MG tablet Take 1 tablet (25 mg total) by mouth 3 (three) times daily as needed for anxiety. 30 tablet 0   RIVAROXABAN (XARELTO) VTE STARTER PACK (15 & 20 MG) Follow package directions: Take one 15mg  tablet by mouth twice a day. On day 22, switch to one 20mg  tablet once a day. Take with food. (Patient taking differently: Take 15-20 mg by mouth See admin instructions. Follow package directions: Take one 15mg  tablet by mouth twice a day. On day 22, switch to one 20mg  tablet once a day. Take with food.) 51 each 0    Labs  Lab Results:  Admission on 08/20/2022  Component Date Value Ref Range Status   SARS Coronavirus 2 by RT  PCR 08/20/2022 NEGATIVE  NEGATIVE Final   Comment: (NOTE) SARS-CoV-2 target nucleic acids are NOT DETECTED.  The SARS-CoV-2 RNA is generally detectable in upper respiratory specimens during the acute phase of infection. The lowest concentration of SARS-CoV-2 viral copies this assay can detect is 138 copies/mL. A negative result does not preclude SARS-Cov-2 infection and should not be used as the sole basis for treatment or other patient management decisions. A negative result may occur with  improper specimen collection/handling, submission of specimen other than nasopharyngeal swab, presence of viral mutation(s) within the areas targeted by this assay, and inadequate number of viral copies(<138 copies/mL). A negative result must be combined with clinical observations, patient history, and epidemiological information. The expected result is Negative.  Fact Sheet for Patients:  BloggerCourse.com  Fact Sheet for Healthcare Providers:  SeriousBroker.it  This test is no                          t yet approved or cleared by the [place] FDA and  has been authorized for detection and/or diagnosis of SARS-CoV-2 by FDA under an Emergency Use Authorization (EUA). This EUA will remain  in effect (meaning this test can be used) for the duration of the COVID-19 declaration under Section 564(b)(1) of the Act, 21 U.S.C.section 360bbb-3(b)(1), unless the authorization is terminated  or revoked sooner.       Influenza A by PCR 08/20/2022 NEGATIVE  NEGATIVE Final   Influenza B by PCR 08/20/2022 NEGATIVE  NEGATIVE Final   Comment: (NOTE) The Xpert Xpress SARS-CoV-2/FLU/RSV plus assay is intended as an aid in the diagnosis of influenza from Nasopharyngeal swab specimens and should not be used as a sole basis for treatment. Nasal washings and  aspirates are unacceptable for Xpert Xpress SARS-CoV-2/FLU/RSV testing.  Fact Sheet for  Patients: EntrepreneurPulse.com.au  Fact Sheet for Healthcare Providers: IncredibleEmployment.be  This test is not yet approved or cleared by the [place] FDA and has been authorized for detection and/or diagnosis of SARS-CoV-2 by FDA under an Emergency Use Authorization (EUA). This EUA will remain in effect (meaning this test can be used) for the duration of the COVID-19 declaration under Section 564(b)(1) of the Act, 21 U.S.C. section 360bbb-3(b)(1), unless the authorization is terminated or revoked.     Resp Syncytial Virus by PCR 08/20/2022 NEGATIVE  NEGATIVE Final   Comment: (NOTE) Fact Sheet for Patients: EntrepreneurPulse.com.au  Fact Sheet for Healthcare Providers: IncredibleEmployment.be  This test is not yet approved or cleared by the [place] FDA and has been authorized for detection and/or diagnosis of SARS-CoV-2 by FDA under an Emergency Use Authorization (EUA). This EUA will remain in effect (meaning this test can be used) for the duration of the COVID-19 declaration under Section 564(b)(1) of the Act, 21 U.S.C. section 360bbb-3(b)(1), unless the authorization is terminated or revoked.  Performed at [facility], [place] [address], [place], [place] [address]    WBC 08/20/2022 5.9  4.0 - 10.5 K/uL Final   RBC 08/20/2022 4.89  3.87 - 5.11 MIL/uL Final   Hemoglobin 08/20/2022 15.2 (H)  12.0 - 15.0 g/dL Final   HCT 08/20/2022 44.4  36.0 - 46.0 % Final   MCV 08/20/2022 90.8  80.0 - 100.0 fL Final   MCH 08/20/2022 31.1  26.0 - 34.0 pg Final   MCHC 08/20/2022 34.2  30.0 - 36.0 g/dL Final   RDW 08/20/2022 13.5  11.5 - 15.5 % Final   Platelets 08/20/2022 294  150 - 400 K/uL Final   nRBC 08/20/2022 0.0  0.0 - 0.2 % Final   Neutrophils Relative % 08/20/2022 54  % Final   Neutro Abs 08/20/2022 3.2  1.7 - 7.7 K/uL Final   Lymphocytes Relative 08/20/2022 34  % Final   Lymphs Abs  08/20/2022 2.0  0.7 - 4.0 K/uL Final   Monocytes Relative 08/20/2022 8  % Final   Monocytes Absolute 08/20/2022 0.5  0.1 - 1.0 K/uL Final   Eosinophils Relative 08/20/2022 2  % Final   Eosinophils Absolute 08/20/2022 0.1  0.0 - 0.5 K/uL Final   Basophils Relative 08/20/2022 1  % Final   Basophils Absolute 08/20/2022 0.1  0.0 - 0.1 K/uL Final   Immature Granulocytes 08/20/2022 1  % Final   Abs Immature Granulocytes 08/20/2022 0.04  0.00 - 0.07 K/uL Final   Performed at [facility], [place] [address] [place], [place] [address]   Sodium 08/20/2022 140  135 - 145 mmol/L Final   Potassium 08/20/2022 3.9  3.5 - 5.1 mmol/L Final   Chloride 08/20/2022 105  98 - 111 mmol/L Final   CO2 08/20/2022 24  22 - 32 mmol/L Final   Glucose, Bld 08/20/2022 84  70 - 99 mg/dL Final   Glucose reference range applies only to samples taken after fasting for at least 8 hours.   BUN 08/20/2022 5 (L)  6 - 20 mg/dL Final   Creatinine, Ser 08/20/2022 0.60  0.44 - 1.00 mg/dL Final   Calcium 08/20/2022 9.3  8.9 - 10.3 mg/dL Final   Total Protein 08/20/2022 6.8  6.5 - 8.1 g/dL Final   Albumin 08/20/2022 3.9  3.5 - 5.0 g/dL Final   AST 08/20/2022 16  15 - 41 U/L Final  ALT 08/20/2022 15  0 - 44 U/L Final   Alkaline Phosphatase 08/20/2022 91  38 - 126 U/L Final   Total Bilirubin 08/20/2022 0.2 (L)  0.3 - 1.2 mg/dL Final   GFR, Estimated 08/20/2022 >60  >60 mL/min Final   Comment: (NOTE) Calculated using the CKD-EPI Creatinine Equation (2021)    Anion gap 08/20/2022 11  5 - 15 Final   Performed at [facility], [address], [place], [place] [address]   Hgb A1c MFr Bld 08/20/2022 5.4  4.8 - 5.6 % Final   Comment: (NOTE) Pre diabetes:          5.7%-6.4%  Diabetes:              >6.4%  Glycemic control for   <7.0% adults with diabetes    Mean Plasma Glucose 08/20/2022 108.28  mg/dL Final   Performed at [facility], [address] [place], [place] [address]   Magnesium 08/20/2022 1.9  1.7 -  2.4 mg/dL Final   Performed at [facility], [address], [place], [place] [address]   Alcohol, Ethyl (B) 08/20/2022 <10  <10 mg/dL Final   Comment: (NOTE) Lowest detectable limit for serum alcohol is 10 mg/dL.  For medical purposes only. Performed at [facility], [address] [place], [place] [address]    Cholesterol 08/20/2022 180  0 - 200 mg/dL Final   Triglycerides 29/56/2130 181 (H)  <150 mg/dL Final   HDL 86/57/8469 77  >40 mg/dL Final   Total CHOL/HDL Ratio 08/20/2022 2.3  RATIO Final   VLDL 08/20/2022 36  0 - 40 mg/dL Final   LDL Cholesterol 08/20/2022 67  0 - 99 mg/dL Final   Comment:        Total Cholesterol/HDL:CHD Risk Coronary Heart Disease Risk Table                     Men   Women  1/2 Average Risk   3.4   3.3  Average Risk       5.0   4.4  2 X Average Risk   9.6   7.1  3 X Average Risk  23.4   11.0        Use the calculated Patient Ratio above and the CHD Risk Table to determine the patient's CHD Risk.        ATP III CLASSIFICATION (LDL):  <100     mg/dL   Optimal  629-528  mg/dL   Near or Above                    Optimal  130-159  mg/dL   Borderline  413-244  mg/dL   High  >010     mg/dL   Very High Performed at [facility], [address], [place], [place] [address]    Color, Urine 08/20/2022 YELLOW  YELLOW Final   APPearance 08/20/2022 CLEAR  CLEAR Final   Specific Gravity, Urine 08/20/2022 1.020  1.005 - 1.030 Final   pH 08/20/2022 5.0  5.0 - 8.0 Final   Glucose, UA 08/20/2022 NEGATIVE  NEGATIVE mg/dL Final   Hgb urine dipstick 08/20/2022 NEGATIVE  NEGATIVE Final   Bilirubin Urine 08/20/2022 NEGATIVE  NEGATIVE Final   Ketones, ur 08/20/2022 NEGATIVE  NEGATIVE mg/dL Final   Protein, ur 66/44/0347 NEGATIVE  NEGATIVE mg/dL Final   Nitrite 42/59/5638 NEGATIVE  NEGATIVE Final   Leukocytes,Ua 08/20/2022 NEGATIVE  NEGATIVE Final  Performed at [facility], [address] [place], [place] [address]   Preg Test, Ur  08/20/2022 Negative  Negative Final   POC Amphetamine UR 08/20/2022 None Detected  NONE DETECTED (Cut Off Level 1000 ng/mL) Final   POC Secobarbital (BAR) 08/20/2022 None Detected  NONE DETECTED (Cut Off Level 300 ng/mL) Final   POC Buprenorphine (BUP) 08/20/2022 None Detected  NONE DETECTED (Cut Off Level 10 ng/mL) Final   POC Oxazepam (BZO) 08/20/2022 None Detected  NONE DETECTED (Cut Off Level 300 ng/mL) Final   POC Cocaine UR 08/20/2022 None Detected  NONE DETECTED (Cut Off Level 300 ng/mL) Final   POC Methamphetamine UR 08/20/2022 Positive (A)  NONE DETECTED (Cut Off Level 1000 ng/mL) Final   POC Morphine 08/20/2022 None Detected  NONE DETECTED (Cut Off Level 300 ng/mL) Final   POC Methadone UR 08/20/2022 None Detected  NONE DETECTED (Cut Off Level 300 ng/mL) Final   POC Oxycodone UR 08/20/2022 None Detected  NONE DETECTED (Cut Off Level 100 ng/mL) Final   POC Marijuana UR 08/20/2022 None Detected  NONE DETECTED (Cut Off Level 50 ng/mL) Final   HIV Screen 4th Generation wRfx 08/20/2022 Non Reactive  Non Reactive Final   Performed at [facility], [address] [place], [place] [address]   SARSCOV2ONAVIRUS 2 AG 08/20/2022 NEGATIVE  NEGATIVE Final   Comment: (NOTE) SARS-CoV-2 antigen NOT DETECTED.   Negative results are presumptive.  Negative results do not preclude SARS-CoV-2 infection and should not be used as the sole basis for treatment or other patient management decisions, including infection  control decisions, particularly in the presence of clinical signs and  symptoms consistent with COVID-19, or in those who have been in contact with the virus.  Negative results must be combined with clinical observations, patient history, and epidemiological information. The expected result is Negative.  Fact Sheet for Patients: https://www.jennings-kim.com/  Fact Sheet for Healthcare Providers: https://alexander-rogers.biz/  This test is not yet approved  or cleared by the [place] FDA and  has been authorized for detection and/or diagnosis of SARS-CoV-2 by FDA under an Emergency Use Authorization (EUA).  This EUA will remain in effect (meaning this test can be used) for the duration of  the COV                          ID-19 declaration under Section 564(b)(1) of the Act, 21 U.S.C. section 360bbb-3(b)(1), unless the authorization is terminated or revoked sooner.     Preg Test, Ur 08/20/2022 NEGATIVE  NEGATIVE Final   Comment:        THE SENSITIVITY OF THIS METHODOLOGY IS >24 mIU/mL    SARS Coronavirus 2 by RT PCR 08/22/2022 NEGATIVE  NEGATIVE Final   Comment: (NOTE) SARS-CoV-2 target nucleic acids are NOT DETECTED.  The SARS-CoV-2 RNA is generally detectable in upper respiratory specimens during the acute phase of infection. The lowest concentration of SARS-CoV-2 viral copies this assay can detect is 138 copies/mL. A negative result does not preclude SARS-Cov-2 infection and should not be used as the sole basis for treatment or other patient management decisions. A negative result may occur with  improper specimen collection/handling, submission of specimen other than nasopharyngeal swab, presence of viral mutation(s) within the areas targeted by this assay, and inadequate number of viral copies(<138 copies/mL). A negative result must be combined with clinical observations, patient history, and epidemiological information. The expected result is Negative.  Fact Sheet for Patients:  BloggerCourse.com  Fact Sheet for Healthcare Providers:  SeriousBroker.it  This test is no                          t yet approved or cleared by the [place] FDA and  has been authorized for detection and/or diagnosis of SARS-CoV-2 by FDA under an Emergency Use Authorization (EUA). This EUA will remain  in effect (meaning this test can be used) for the duration of the COVID-19 declaration  under Section 564(b)(1) of the Act, 21 U.S.C.section 360bbb-3(b)(1), unless the authorization is terminated  or revoked sooner.       Influenza A by PCR 08/22/2022 NEGATIVE  NEGATIVE Final   Influenza B by PCR 08/22/2022 NEGATIVE  NEGATIVE Final   Comment: (NOTE) The Xpert Xpress SARS-CoV-2/FLU/RSV plus assay is intended as an aid in the diagnosis of influenza from Nasopharyngeal swab specimens and should not be used as a sole basis for treatment. Nasal washings and aspirates are unacceptable for Xpert Xpress SARS-CoV-2/FLU/RSV testing.  Fact Sheet for Patients: BloggerCourse.com  Fact Sheet for Healthcare Providers: SeriousBroker.it  This test is not yet approved or cleared by the [place] FDA and has been authorized for detection and/or diagnosis of SARS-CoV-2 by FDA under an Emergency Use Authorization (EUA). This EUA will remain in effect (meaning this test can be used) for the duration of the COVID-19 declaration under Section 564(b)(1) of the Act, 21 U.S.C. section 360bbb-3(b)(1), unless the authorization is terminated or revoked.     Resp Syncytial Virus by PCR 08/22/2022 NEGATIVE  NEGATIVE Final   Comment: (NOTE) Fact Sheet for Patients: BloggerCourse.com  Fact Sheet for Healthcare Providers: SeriousBroker.it  This test is not yet approved or cleared by the [place] FDA and has been authorized for detection and/or diagnosis of SARS-CoV-2 by FDA under an Emergency Use Authorization (EUA). This EUA will remain in effect (meaning this test can be used) for the duration of the COVID-19 declaration under Section 564(b)(1) of the Act, 21 U.S.C. section 360bbb-3(b)(1), unless the authorization is terminated or revoked.  Performed at [facility], [address] [place], [place] [address]   Admission on 07/22/2022, Discharged on 07/23/2022  Component  Date Value Ref Range Status   SARS Coronavirus 2 by RT PCR 07/22/2022 NEGATIVE  NEGATIVE Final   Comment: (NOTE) SARS-CoV-2 target nucleic acids are NOT DETECTED.  The SARS-CoV-2 RNA is generally detectable in upper respiratory specimens during the acute phase of infection. The lowest concentration of SARS-CoV-2 viral copies this assay can detect is 138 copies/mL. A negative result does not preclude SARS-Cov-2 infection and should not be used as the sole basis for treatment or other patient management decisions. A negative result may occur with  improper specimen collection/handling, submission of specimen other than nasopharyngeal swab, presence of viral mutation(s) within the areas targeted by this assay, and inadequate number of viral copies(<138 copies/mL). A negative result must be combined with clinical observations, patient history, and epidemiological information. The expected result is Negative.  Fact Sheet for Patients:  BloggerCourse.com  Fact Sheet for Healthcare Providers:  SeriousBroker.it  This test is no                          t yet approved or cleared by the [place] FDA and  has been authorized for detection and/or diagnosis of SARS-CoV-2 by FDA under an Emergency Use Authorization (EUA). This EUA will remain  in effect (meaning this test can be used) for the duration of the COVID-19 declaration under Section 564(b)(1) of the Act, 21 U.S.C.section 360bbb-3(b)(1), unless the authorization is terminated  or revoked sooner.       Influenza A by PCR 07/22/2022 NEGATIVE  NEGATIVE Final   Influenza B by PCR 07/22/2022 NEGATIVE  NEGATIVE Final   Comment: (NOTE) The Xpert Xpress SARS-CoV-2/FLU/RSV plus assay is intended as an aid in the diagnosis of influenza from Nasopharyngeal swab specimens and should not be used as a sole basis for treatment. Nasal washings and aspirates are unacceptable for Xpert Xpress  SARS-CoV-2/FLU/RSV testing.  Fact Sheet for Patients: BloggerCourse.com  Fact Sheet for Healthcare Providers: SeriousBroker.it  This test is not yet approved or cleared by the [place] FDA and has been authorized for detection and/or diagnosis of SARS-CoV-2 by FDA under an Emergency Use Authorization (EUA). This EUA will remain in effect (meaning this test can be used) for the duration of the COVID-19 declaration under Section 564(b)(1) of the Act, 21 U.S.C. section 360bbb-3(b)(1), unless the authorization is terminated or revoked.     Resp Syncytial Virus by PCR 07/22/2022 NEGATIVE  NEGATIVE Final   Comment: (NOTE) Fact Sheet for Patients: BloggerCourse.com  Fact Sheet for Healthcare Providers: SeriousBroker.it  This test is not yet approved or cleared by the [place] FDA and has been authorized for detection and/or diagnosis of SARS-CoV-2 by FDA under an Emergency Use Authorization (EUA). This EUA will remain in effect (meaning this test can be used) for the duration of the COVID-19 declaration under Section 564(b)(1) of the Act, 21 U.S.C. section 360bbb-3(b)(1), unless the authorization is terminated or revoked.  Performed at [facility], [address] [place], [place] [address]    Sodium 07/22/2022 136  135 - 145 mmol/L Final   Potassium 07/22/2022 4.5  3.5 - 5.1 mmol/L Final   Chloride 07/22/2022 106  98 - 111 mmol/L Final   CO2 07/22/2022 22  22 - 32 mmol/L Final   Glucose, Bld 07/22/2022 87  70 - 99 mg/dL Final   Glucose reference range applies only to samples taken after fasting for at least 8 hours.   BUN 07/22/2022 18  6 - 20 mg/dL Final   Creatinine, Ser 07/22/2022 0.61  0.44 - 1.00 mg/dL Final   Calcium 93/81/8299 9.3  8.9 - 10.3 mg/dL Final   Total Protein 37/16/9678 7.1  6.5 - 8.1 g/dL Final   Albumin 93/81/0175 4.0  3.5 - 5.0 g/dL Final    AST 05/28/8526 21  15 - 41 U/L Final   ALT 07/22/2022 18  0 - 44 U/L Final   Alkaline Phosphatase 07/22/2022 67  38 - 126 U/L Final   Total Bilirubin 07/22/2022 0.1 (L)  0.3 - 1.2 mg/dL Final   GFR, Estimated 07/22/2022 >60  >60 mL/min Final   Comment: (NOTE) Calculated using the CKD-EPI Creatinine Equation (2021)    Anion gap 07/22/2022 8  5 - 15 Final   Performed at [facility], [address], [place], [place] [address]   WBC 07/22/2022 7.2  4.0 - 10.5 K/uL Final   RBC 07/22/2022 5.27 (H)  3.87 - 5.11 MIL/uL Final   Hemoglobin 07/22/2022 15.8 (H)  12.0 - 15.0 g/dL Final   HCT 35/36/1443 49.0 (H)  36.0 - 46.0 % Final   MCV 07/22/2022 93.0  80.0 - 100.0 fL Final   MCH 07/22/2022 30.0  26.0 - 34.0 pg Final   MCHC 07/22/2022 32.2  30.0 - 36.0 g/dL Final   RDW 16/05/9603 13.7  11.5 - 15.5 % Final   Platelets 07/22/2022 276  150 - 400 K/uL Final   nRBC 07/22/2022 0.0  0.0 - 0.2 % Final   Performed at [facility], [address], [place], [place] [address]   I-stat hCG, quantitative 07/22/2022 <5.0  <5 mIU/mL Final   Comment 3 07/22/2022          Final   Comment:   GEST. AGE      CONC.  (mIU/mL)   <=1 WEEK        5 - 50     2 WEEKS       50 - 500     3 WEEKS       100 - 10,000     4 WEEKS     1,000 - 30,000        FEMALE AND NON-PREGNANT FEMALE:     LESS THAN 5 mIU/mL   Admission on 07/17/2022, Discharged on 07/22/2022  Component Date Value Ref Range Status   SARS Coronavirus 2 by RT PCR 07/17/2022 NEGATIVE  NEGATIVE Final   Comment: (NOTE) SARS-CoV-2 target nucleic acids are NOT DETECTED.  The SARS-CoV-2 RNA is generally detectable in upper respiratory specimens during the acute phase of infection. The lowest concentration of SARS-CoV-2 viral copies this assay can detect is 138 copies/mL. A negative result does not preclude SARS-Cov-2 infection and should not be used as the sole basis for treatment or other patient management decisions. A negative result may occur  with  improper specimen collection/handling, submission of specimen other than nasopharyngeal swab, presence of viral mutation(s) within the areas targeted by this assay, and inadequate number of viral copies(<138 copies/mL). A negative result must be combined with clinical observations, patient history, and epidemiological information. The expected result is Negative.  Fact Sheet for Patients:  BloggerCourse.com  Fact Sheet for Healthcare Providers:  SeriousBroker.it  This test is no                          t yet approved or cleared by the [place] FDA and  has been authorized for detection and/or diagnosis of SARS-CoV-2 by FDA under an Emergency Use Authorization (EUA). This EUA will remain  in effect (meaning this test can be used) for the duration of the COVID-19 declaration under Section 564(b)(1) of the Act, 21 U.S.C.section 360bbb-3(b)(1), unless the authorization is terminated  or revoked sooner.       Influenza A by PCR 07/17/2022 NEGATIVE  NEGATIVE Final   Influenza B by PCR 07/17/2022 NEGATIVE  NEGATIVE Final   Comment: (NOTE) The Xpert Xpress SARS-CoV-2/FLU/RSV plus assay is intended as an aid in the diagnosis of influenza from Nasopharyngeal swab specimens and should not be used as a sole basis for treatment. Nasal washings and aspirates are unacceptable for Xpert Xpress SARS-CoV-2/FLU/RSV testing.  Fact Sheet for Patients: BloggerCourse.com  Fact Sheet for Healthcare Providers: SeriousBroker.it  This test is not yet approved or cleared by the [place] FDA and has been authorized for detection and/or diagnosis of SARS-CoV-2 by FDA under an Emergency Use Authorization (EUA). This EUA will remain in effect (meaning this test can be used) for the duration of the COVID-19 declaration under Section 564(b)(1) of the Act, 21 U.S.C. section 360bbb-3(b)(1),  unless the authorization is terminated or revoked.     Resp Syncytial Virus by PCR 07/17/2022 NEGATIVE  NEGATIVE Final  Comment: (NOTE) Fact Sheet for Patients: EntrepreneurPulse.com.au  Fact Sheet for Healthcare Providers: IncredibleEmployment.be  This test is not yet approved or cleared by the [place] FDA and has been authorized for detection and/or diagnosis of SARS-CoV-2 by FDA under an Emergency Use Authorization (EUA). This EUA will remain in effect (meaning this test can be used) for the duration of the COVID-19 declaration under Section 564(b)(1) of the Act, 21 U.S.C. section 360bbb-3(b)(1), unless the authorization is terminated or revoked.  Performed at [facility], [place] [address], [place], [place] [address]    WBC 07/17/2022 8.9  4.0 - 10.5 K/uL Final   RBC 07/17/2022 4.64  3.87 - 5.11 MIL/uL Final   Hemoglobin 07/17/2022 14.2  12.0 - 15.0 g/dL Final   HCT 07/17/2022 41.6  36.0 - 46.0 % Final   MCV 07/17/2022 89.7  80.0 - 100.0 fL Final   MCH 07/17/2022 30.6  26.0 - 34.0 pg Final   MCHC 07/17/2022 34.1  30.0 - 36.0 g/dL Final   RDW 07/17/2022 13.7  11.5 - 15.5 % Final   Platelets 07/17/2022 306  150 - 400 K/uL Final   nRBC 07/17/2022 0.0  0.0 - 0.2 % Final   Neutrophils Relative % 07/17/2022 59  % Final   Neutro Abs 07/17/2022 5.2  1.7 - 7.7 K/uL Final   Lymphocytes Relative 07/17/2022 35  % Final   Lymphs Abs 07/17/2022 3.1  0.7 - 4.0 K/uL Final   Monocytes Relative 07/17/2022 5  % Final   Monocytes Absolute 07/17/2022 0.5  0.1 - 1.0 K/uL Final   Eosinophils Relative 07/17/2022 1  % Final   Eosinophils Absolute 07/17/2022 0.1  0.0 - 0.5 K/uL Final   Basophils Relative 07/17/2022 0  % Final   Basophils Absolute 07/17/2022 0.0  0.0 - 0.1 K/uL Final   Immature Granulocytes 07/17/2022 0  % Final   Abs Immature Granulocytes 07/17/2022 0.04  0.00 - 0.07 K/uL Final   Performed at [facility], [place] [address], [place], [place] [address]   Sodium 07/17/2022 139  135 - 145 mmol/L Final   Potassium 07/17/2022 3.7  3.5 - 5.1 mmol/L Final   Chloride 07/17/2022 104  98 - 111 mmol/L Final   CO2 07/17/2022 24  22 - 32 mmol/L Final   Glucose, Bld 07/17/2022 51 (L)  70 - 99 mg/dL Final   Glucose reference range applies only to samples taken after fasting for at least 8 hours.   BUN 07/17/2022 8  6 - 20 mg/dL Final   Creatinine, Ser 07/17/2022 0.69  0.44 - 1.00 mg/dL Final   Calcium 07/17/2022 8.8 (L)  8.9 - 10.3 mg/dL Final   Total Protein 07/17/2022 6.2 (L)  6.5 - 8.1 g/dL Final   Albumin 07/17/2022 3.6  3.5 - 5.0 g/dL Final   AST 07/17/2022 23  15 - 41 U/L Final   ALT 07/17/2022 19  0 - 44 U/L Final   Alkaline Phosphatase 07/17/2022 82  38 - 126 U/L Final   Total Bilirubin 07/17/2022 0.6  0.3 - 1.2 mg/dL Final   GFR, Estimated 07/17/2022 >60  >60 mL/min Final   Comment: (NOTE) Calculated using the CKD-EPI Creatinine Equation (2021)    Anion gap 07/17/2022 11  5 - 15 Final   Performed at [place] [address], [place], [place] [address]   Alcohol, Ethyl (B) 07/17/2022 37 (H)  <10 mg/dL Final   Comment: (NOTE) Lowest detectable limit for serum alcohol  is 10 mg/dL.  For medical purposes only. Performed at [facility], [address], [place], [place] [address]    Neisseria Gonorrhea 07/17/2022 Negative   Final   Chlamydia 07/17/2022 Positive (A)   Final   Comment 07/17/2022 Normal Reference Ranger Chlamydia - Negative   Final   Comment 07/17/2022 Normal Reference Range Neisseria Gonorrhea - Negative   Final   POC Amphetamine UR 07/17/2022 None Detected  NONE DETECTED (Cut Off Level 1000 ng/mL) Final   POC Secobarbital (BAR) 07/17/2022 None Detected  NONE DETECTED (Cut Off Level 300 ng/mL) Final   POC Buprenorphine (BUP) 07/17/2022 None Detected  NONE DETECTED (Cut Off Level 10 ng/mL) Final   POC Oxazepam (BZO) 07/17/2022 Positive (A)  NONE DETECTED (Cut Off Level 300 ng/mL)  Final   POC Cocaine UR 07/17/2022 None Detected  NONE DETECTED (Cut Off Level 300 ng/mL) Final   POC Methamphetamine UR 07/17/2022 None Detected  NONE DETECTED (Cut Off Level 1000 ng/mL) Final   POC Morphine 07/17/2022 None Detected  NONE DETECTED (Cut Off Level 300 ng/mL) Final   POC Methadone UR 07/17/2022 None Detected  NONE DETECTED (Cut Off Level 300 ng/mL) Final   POC Oxycodone UR 07/17/2022 None Detected  NONE DETECTED (Cut Off Level 100 ng/mL) Final   POC Marijuana UR 07/17/2022 None Detected  NONE DETECTED (Cut Off Level 50 ng/mL) Final   Preg Test, Ur 07/17/2022 Negative  Negative Final   RPR Ser Ql 07/17/2022 NON REACTIVE  NON REACTIVE Final   Performed at [facility], [address] [place], [place] [address]   HIV Screen 4th Generation wRfx 07/17/2022 Non Reactive  Non Reactive Final   Performed at [facility], [address], [place], [place] [address]   SARSCOV2ONAVIRUS 2 AG 07/17/2022 NEGATIVE  NEGATIVE Final   Comment: (NOTE) SARS-CoV-2 antigen NOT DETECTED.   Negative results are presumptive.  Negative results do not preclude SARS-CoV-2 infection and should not be used as the sole basis for treatment or other patient management decisions, including infection  control decisions, particularly in the presence of clinical signs and  symptoms consistent with COVID-19, or in those who have been in contact with the virus.  Negative results must be combined with clinical observations, patient history, and epidemiological information. The expected result is Negative.  Fact Sheet for Patients: https://www.jennings-kim.com/  Fact Sheet for Healthcare Providers: https://alexander-rogers.biz/  This test is not yet approved or cleared by the [place] FDA and  has been authorized for detection and/or diagnosis of SARS-CoV-2 by FDA under an Emergency Use Authorization (EUA).  This EUA will remain in effect (meaning this test can be used)  for the duration of  the COV                          ID-19 declaration under Section 564(b)(1) of the Act, 21 U.S.C. section 360bbb-3(b)(1), unless the authorization is terminated or revoked sooner.     Preg Test, Ur 07/17/2022 NEGATIVE  NEGATIVE Final   Comment:        THE SENSITIVITY OF THIS METHODOLOGY IS >24 mIU/mL   Admission on 07/14/2022, Discharged on 07/14/2022  Component Date Value Ref Range Status   SARS Coronavirus 2 by RT PCR 07/14/2022 NEGATIVE  NEGATIVE Final   Comment: (NOTE) SARS-CoV-2 target nucleic acids are NOT DETECTED.  The SARS-CoV-2 RNA is generally detectable in upper respiratory specimens during the acute phase of infection. The lowest concentration  of SARS-CoV-2 viral copies this assay can detect is 138 copies/mL. A negative result does not preclude SARS-Cov-2 infection and should not be used as the sole basis for treatment or other patient management decisions. A negative result may occur with  improper specimen collection/handling, submission of specimen other than nasopharyngeal swab, presence of viral mutation(s) within the areas targeted by this assay, and inadequate number of viral copies(<138 copies/mL). A negative result must be combined with clinical observations, patient history, and epidemiological information. The expected result is Negative.  Fact Sheet for Patients:  BloggerCourse.com  Fact Sheet for Healthcare Providers:  SeriousBroker.it  This test is no                          t yet approved or cleared by the [place] FDA and  has been authorized for detection and/or diagnosis of SARS-CoV-2 by FDA under an Emergency Use Authorization (EUA). This EUA will remain  in effect (meaning this test can be used) for the duration of the COVID-19 declaration under Section 564(b)(1) of the Act, 21 U.S.C.section 360bbb-3(b)(1), unless the authorization is terminated  or revoked sooner.        Influenza A by PCR 07/14/2022 NEGATIVE  NEGATIVE Final   Influenza B by PCR 07/14/2022 NEGATIVE  NEGATIVE Final   Comment: (NOTE) The Xpert Xpress SARS-CoV-2/FLU/RSV plus assay is intended as an aid in the diagnosis of influenza from Nasopharyngeal swab specimens and should not be used as a sole basis for treatment. Nasal washings and aspirates are unacceptable for Xpert Xpress SARS-CoV-2/FLU/RSV testing.  Fact Sheet for Patients: BloggerCourse.com  Fact Sheet for Healthcare Providers: SeriousBroker.it  This test is not yet approved or cleared by the [place] FDA and has been authorized for detection and/or diagnosis of SARS-CoV-2 by FDA under an Emergency Use Authorization (EUA). This EUA will remain in effect (meaning this test can be used) for the duration of the COVID-19 declaration under Section 564(b)(1) of the Act, 21 U.S.C. section 360bbb-3(b)(1), unless the authorization is terminated or revoked.  Performed at [facility], [address], [place], [place] [address]   Admission on 07/11/2022, Discharged on 07/11/2022  Component Date Value Ref Range Status   WBC 07/11/2022 9.7  4.0 - 10.5 K/uL Final   RBC 07/11/2022 5.26 (H)  3.87 - 5.11 MIL/uL Final   Hemoglobin 07/11/2022 15.9 (H)  12.0 - 15.0 g/dL Final   HCT 60/45/4098 48.3 (H)  36.0 - 46.0 % Final   MCV 07/11/2022 91.8  80.0 - 100.0 fL Final   MCH 07/11/2022 30.2  26.0 - 34.0 pg Final   MCHC 07/11/2022 32.9  30.0 - 36.0 g/dL Final   RDW 11/91/4782 13.5  11.5 - 15.5 % Final   Platelets 07/11/2022 282  150 - 400 K/uL Final   nRBC 07/11/2022 0.0  0.0 - 0.2 % Final   Neutrophils Relative % 07/11/2022 63  % Final   Neutro Abs 07/11/2022 6.1  1.7 - 7.7 K/uL Final   Lymphocytes Relative 07/11/2022 29  % Final   Lymphs Abs 07/11/2022 2.8  0.7 - 4.0 K/uL Final   Monocytes Relative 07/11/2022 7  % Final   Monocytes Absolute 07/11/2022 0.7   0.1 - 1.0 K/uL Final   Eosinophils Relative 07/11/2022 1  % Final   Eosinophils Absolute 07/11/2022 0.1  0.0 - 0.5 K/uL Final   Basophils Relative 07/11/2022 0  % Final   Basophils Absolute 07/11/2022 0.0  0.0 -  0.1 K/uL Final   Immature Granulocytes 07/11/2022 0  % Final   Abs Immature Granulocytes 07/11/2022 0.03  0.00 - 0.07 K/uL Final   Performed at [facility], [address], [place], [place] [address]   Sodium 07/11/2022 139  135 - 145 mmol/L Final   Potassium 07/11/2022 3.8  3.5 - 5.1 mmol/L Final   Chloride 07/11/2022 105  98 - 111 mmol/L Final   CO2 07/11/2022 24  22 - 32 mmol/L Final   Glucose, Bld 07/11/2022 90  70 - 99 mg/dL Final   Glucose reference range applies only to samples taken after fasting for at least 8 hours.   BUN 07/11/2022 9  6 - 20 mg/dL Final   Creatinine, Ser 07/11/2022 0.57  0.44 - 1.00 mg/dL Final   Calcium 19/14/7829 8.5 (L)  8.9 - 10.3 mg/dL Final   Total Protein 56/21/3086 6.3 (L)  6.5 - 8.1 g/dL Final   Albumin 57/84/6962 3.6  3.5 - 5.0 g/dL Final   AST 95/28/4132 19  15 - 41 U/L Final   ALT 07/11/2022 16  0 - 44 U/L Final   Alkaline Phosphatase 07/11/2022 78  38 - 126 U/L Final   Total Bilirubin 07/11/2022 0.4  0.3 - 1.2 mg/dL Final   GFR, Estimated 07/11/2022 >60  >60 mL/min Final   Comment: (NOTE) Calculated using the CKD-EPI Creatinine Equation (2021)    Anion gap 07/11/2022 10  5 - 15 Final   Performed at [facility], [address], [place], [place] [address]   Troponin I (High Sensitivity) 07/11/2022 <2  <18 ng/L Final   Comment: (NOTE) Elevated high sensitivity troponin I (hsTnI) values and significant  changes across serial measurements may suggest ACS but many other  chronic and acute conditions are known to elevate hsTnI results.  Refer to the "Links" section for chest pain algorithms and additional  guidance. Performed at [facility], [address] [place], [place] [address]    Lipase 07/11/2022 35  11 - 51  U/L Final   Performed at [facility], [address] [place], [place] [address]   D-Dimer, Quant 07/11/2022 <0.27  0.00 - 0.50 ug/mL-FEU Final   Comment: (NOTE) At the manufacturer cut-off value of 0.5 g/mL FEU, this assay has a negative predictive value of 95-100%.This assay is intended for use in conjunction with a clinical pretest probability (PTP) assessment model to exclude pulmonary embolism (PE) and deep venous thrombosis (DVT) in outpatients suspected of PE or DVT. Results should be correlated with clinical presentation. Performed at [facility], [address] [place], [place] [address]    Color, Urine 07/11/2022 YELLOW  YELLOW Final   APPearance 07/11/2022 HAZY (A)  CLEAR Final   Specific Gravity, Urine 07/11/2022 1.024  1.005 - 1.030 Final   pH 07/11/2022 6.0  5.0 - 8.0 Final   Glucose, UA 07/11/2022 NEGATIVE  NEGATIVE mg/dL Final   Hgb urine dipstick 07/11/2022 NEGATIVE  NEGATIVE Final   Bilirubin Urine 07/11/2022 NEGATIVE  NEGATIVE Final   Ketones, ur 07/11/2022 NEGATIVE  NEGATIVE mg/dL Final   Protein, ur 59/56/3875 NEGATIVE  NEGATIVE mg/dL Final   Nitrite 64/33/2951 NEGATIVE  NEGATIVE Final   Leukocytes,Ua 07/11/2022 NEGATIVE  NEGATIVE Final   Performed at [facility], [address] [place], [place] [address]   SARS Coronavirus 2 by RT PCR 07/11/2022 NEGATIVE  NEGATIVE Final   Comment: (NOTE) SARS-CoV-2 target nucleic acids are NOT DETECTED.  The SARS-CoV-2 RNA is generally detectable in upper respiratory specimens during the acute phase of infection. The lowest concentration of SARS-CoV-2 viral copies this assay can detect is 138 copies/mL. A negative result does not preclude SARS-Cov-2 infection and should not be used as the sole basis for treatment or other patient management decisions. A negative result may occur with  improper specimen collection/handling, submission of specimen other than nasopharyngeal swab, presence of viral  mutation(s) within the areas targeted by this assay, and inadequate number of viral copies(<138 copies/mL). A negative result must be combined with clinical observations, patient history, and epidemiological information. The expected result is Negative.  Fact Sheet for Patients:  BloggerCourse.com  Fact Sheet for Healthcare Providers:  SeriousBroker.it  This test is no                          t yet approved or cleared by the [place] FDA and  has been authorized for detection and/or diagnosis of SARS-CoV-2 by FDA under an Emergency Use Authorization (EUA). This EUA will remain  in effect (meaning this test can be used) for the duration of the COVID-19 declaration under Section 564(b)(1) of the Act, 21 U.S.C.section 360bbb-3(b)(1), unless the authorization is terminated  or revoked sooner.       Influenza A by PCR 07/11/2022 NEGATIVE  NEGATIVE Final   Influenza B by PCR 07/11/2022 NEGATIVE  NEGATIVE Final   Comment: (NOTE) The Xpert Xpress SARS-CoV-2/FLU/RSV plus assay is intended as an aid in the diagnosis of influenza from Nasopharyngeal swab specimens and should not be used as a sole basis for treatment. Nasal washings and aspirates are unacceptable for Xpert Xpress SARS-CoV-2/FLU/RSV testing.  Fact Sheet for Patients: BloggerCourse.com  Fact Sheet for Healthcare Providers: SeriousBroker.it  This test is not yet approved or cleared by the [place] FDA and has been authorized for detection and/or diagnosis of SARS-CoV-2 by FDA under an Emergency Use Authorization (EUA). This EUA will remain in effect (meaning this test can be used) for the duration of the COVID-19 declaration under Section 564(b)(1) of the Act, 21 U.S.C. section 360bbb-3(b)(1), unless the authorization is terminated or revoked.  Performed at [facility], [address], [place],  [place] [address]    I-stat hCG, quantitative 07/11/2022 <5.0  <5 mIU/mL Final   Comment 3 07/11/2022          Final   Comment:   GEST. AGE      CONC.  (mIU/mL)   <=1 WEEK        5 - 50     2 WEEKS       50 - 500     3 WEEKS       100 - 10,000     4 WEEKS     1,000 - 30,000        FEMALE AND NON-PREGNANT FEMALE:     LESS THAN 5 mIU/mL    Troponin I (High Sensitivity) 07/11/2022 2  <18 ng/L Final   Comment: (NOTE) Elevated high sensitivity troponin I (hsTnI) values and significant  changes across serial measurements may suggest ACS but many other  chronic and acute conditions are known to elevate hsTnI results.  Refer to the "Links" section for chest pain algorithms and additional  guidance. Performed at [facility], [address], [place], [place] [address]   Admission on 07/02/2022, Discharged on 07/02/2022  Component Date Value Ref Range Status   Color,  Urine 07/02/2022 YELLOW  YELLOW Final   APPearance 07/02/2022 HAZY (A)  CLEAR Final   Specific Gravity, Urine 07/02/2022 1.014  1.005 - 1.030 Final   pH 07/02/2022 5.0  5.0 - 8.0 Final   Glucose, UA 07/02/2022 NEGATIVE  NEGATIVE mg/dL Final   Hgb urine dipstick 07/02/2022 SMALL (A)  NEGATIVE Final   Bilirubin Urine 07/02/2022 NEGATIVE  NEGATIVE Final   Ketones, ur 07/02/2022 NEGATIVE  NEGATIVE mg/dL Final   Protein, ur 40/98/1191 NEGATIVE  NEGATIVE mg/dL Final   Nitrite 47/82/9562 POSITIVE (A)  NEGATIVE Final   Leukocytes,Ua 07/02/2022 LARGE (A)  NEGATIVE Final   RBC / HPF 07/02/2022 6-10  0 - 5 RBC/hpf Final   WBC, UA 07/02/2022 >50 (H)  0 - 5 WBC/hpf Final   Bacteria, UA 07/02/2022 FEW (A)  NONE SEEN Final   Squamous Epithelial / HPF 07/02/2022 6-10  0 - 5 Final   Mucus 07/02/2022 PRESENT   Final   Non Squamous Epithelial 07/02/2022 0-5 (A)  NONE SEEN Final   Performed at [facility], [address] [place], [place] [address]   WBC 07/02/2022 9.9  4.0 - 10.5 K/uL Final   RBC 07/02/2022 5.18 (H)  3.87  - 5.11 MIL/uL Final   Hemoglobin 07/02/2022 15.8 (H)  12.0 - 15.0 g/dL Final   HCT 57/84/6962 48.5 (H)  36.0 - 46.0 % Final   MCV 07/02/2022 93.6  80.0 - 100.0 fL Final   MCH 07/02/2022 30.5  26.0 - 34.0 pg Final   MCHC 07/02/2022 32.6  30.0 - 36.0 g/dL Final   RDW 95/28/4132 13.5  11.5 - 15.5 % Final   Platelets 07/02/2022 249  150 - 400 K/uL Final   nRBC 07/02/2022 0.0  0.0 - 0.2 % Final   Performed at [facility], [address] [place], [place] [address]   Sodium 07/02/2022 138  135 - 145 mmol/L Final   Potassium 07/02/2022 3.9  3.5 - 5.1 mmol/L Final   Chloride 07/02/2022 105  98 - 111 mmol/L Final   CO2 07/02/2022 26  22 - 32 mmol/L Final   Glucose, Bld 07/02/2022 114 (H)  70 - 99 mg/dL Final   Glucose reference range applies only to samples taken after fasting for at least 8 hours.   BUN 07/02/2022 9  6 - 20 mg/dL Final   Creatinine, Ser 07/02/2022 0.64  0.44 - 1.00 mg/dL Final   Calcium 27/25/3664 8.9  8.9 - 10.3 mg/dL Final   GFR, Estimated 07/02/2022 >60  >60 mL/min Final   Comment: (NOTE) Calculated using the CKD-EPI Creatinine Equation (2021)    Anion gap 07/02/2022 7  5 - 15 Final   Performed at [facility], [address] [place], [place] [address]   Lipase 07/02/2022 35  11 - 51 U/L Final   Performed at [facility], [address], [place], [place] [address]   Total Protein 07/02/2022 6.9  6.5 - 8.1 g/dL Final   Albumin 63/87/5643 3.8  3.5 - 5.0 g/dL Final   AST 32/95/1884 17  15 - 41 U/L Final   ALT 07/02/2022 16  0 - 44 U/L Final   Alkaline Phosphatase 07/02/2022 91  38 - 126 U/L Final   Total Bilirubin 07/02/2022 0.9  0.3 - 1.2 mg/dL Final   Bilirubin, Direct 07/02/2022 0.1  0.0 - 0.2 mg/dL Final   Indirect Bilirubin 07/02/2022 0.8  0.3 - 0.9 mg/dL Final   Performed at [facility], [address] [place], [place] [id number]  Admission on 06/06/2022, Discharged on 06/06/2022   Component Date Value Ref Range Status   Sodium 06/06/2022 140  135 - 145 mmol/L Final   Potassium 06/06/2022 3.2 (L)  3.5 - 5.1 mmol/L Final   Chloride 06/06/2022 103  98 - 111 mmol/L Final   CO2 06/06/2022 26  22 - 32 mmol/L Final   Glucose, Bld 06/06/2022 97  70 - 99 mg/dL Final   Glucose reference range applies only to samples taken after fasting for at least 8 hours.   BUN 06/06/2022 6  6 - 20 mg/dL Final   Creatinine, Ser 06/06/2022 0.54  0.44 - 1.00 mg/dL Final   Calcium 19/14/782911/10/2021 9.0  8.9 - 10.3 mg/dL Final   Total Protein 56/21/308611/10/2021 6.5  6.5 - 8.1 g/dL Final   Albumin 57/84/696211/10/2021 3.6  3.5 - 5.0 g/dL Final   AST 95/28/413211/10/2021 20  15 - 41 U/L Final   ALT 06/06/2022 17  0 - 44 U/L Final   Alkaline Phosphatase 06/06/2022 111  38 - 126 U/L Final   Total Bilirubin 06/06/2022 0.3  0.3 - 1.2 mg/dL Final   GFR, Estimated 06/06/2022 >60  >60 mL/min Final   Comment: (NOTE) Calculated using the CKD-EPI Creatinine Equation (2021)    Anion gap 06/06/2022 11  5 - 15 Final   Performed at [facility], [address] [place], [place] [address]   Alcohol, Ethyl (B) 06/06/2022 18 (H)  <10 mg/dL Final   Comment: (NOTE) Lowest detectable limit for serum alcohol is 10 mg/dL.  For medical purposes only. Performed at [facility], [address] [place], [place] [address]    WBC 06/06/2022 6.0  4.0 - 10.5 K/uL Final   RBC 06/06/2022 4.76  3.87 - 5.11 MIL/uL Final   Hemoglobin 06/06/2022 14.5  12.0 - 15.0 g/dL Final   HCT 66/44/034711/10/2021 43.9  36.0 - 46.0 % Final   MCV 06/06/2022 92.2  80.0 - 100.0 fL Final   MCH 06/06/2022 30.5  26.0 - 34.0 pg Final   MCHC 06/06/2022 33.0  30.0 - 36.0 g/dL Final   RDW 42/59/563811/10/2021 13.5  11.5 - 15.5 % Final   Platelets 06/06/2022 224  150 - 400 K/uL Final   nRBC 06/06/2022 0.0  0.0 - 0.2 % Final   Performed at [facility], [address] [place], [place] [address]   I-stat hCG, quantitative 06/06/2022 <5.0  <5 mIU/mL Final   Comment 3  06/06/2022          Final   Comment:   GEST. AGE      CONC.  (mIU/mL)   <=1 WEEK        5 - 50     2 WEEKS       50 - 500     3 WEEKS       100 - 10,000     4 WEEKS     1,000 - 30,000        FEMALE AND NON-PREGNANT FEMALE:     LESS THAN 5 mIU/mL   Admission on 06/05/2022, Discharged on 06/05/2022  Component Date Value Ref Range Status   Sodium 06/05/2022 139  135 - 145 mmol/L Final   Potassium 06/05/2022 3.7  3.5 - 5.1 mmol/L Final   Chloride 06/05/2022 105  98 - 111 mmol/L Final   CO2 06/05/2022 26  22 - 32 mmol/L Final   Glucose, Bld 06/05/2022  114 (H)  70 - 99 mg/dL Final   Glucose reference range applies only to samples taken after fasting for at least 8 hours.   BUN 06/05/2022 8  6 - 20 mg/dL Final   Creatinine, Ser 06/05/2022 0.50  0.44 - 1.00 mg/dL Final   Calcium 03/47/425911/09/2021 8.8 (L)  8.9 - 10.3 mg/dL Final   Total Protein 56/38/756411/09/2021 7.1  6.5 - 8.1 g/dL Final   Albumin 33/29/518811/09/2021 3.9  3.5 - 5.0 g/dL Final   AST 41/66/063011/09/2021 20  15 - 41 U/L Final   ALT 06/05/2022 17  0 - 44 U/L Final   Alkaline Phosphatase 06/05/2022 120  38 - 126 U/L Final   Total Bilirubin 06/05/2022 0.5  0.3 - 1.2 mg/dL Final   GFR, Estimated 06/05/2022 >60  >60 mL/min Final   Comment: (NOTE) Calculated using the CKD-EPI Creatinine Equation (2021)    Anion gap 06/05/2022 8  5 - 15 Final   Performed at [facility], [address] [place], [place] [address]   WBC 06/05/2022 4.9  4.0 - 10.5 K/uL Final   RBC 06/05/2022 5.00  3.87 - 5.11 MIL/uL Final   Hemoglobin 06/05/2022 15.1 (H)  12.0 - 15.0 g/dL Final   HCT 93/23/557311/09/2021 46.0  36.0 - 46.0 % Final   MCV 06/05/2022 92.0  80.0 - 100.0 fL Final   MCH 06/05/2022 30.2  26.0 - 34.0 pg Final   MCHC 06/05/2022 32.8  30.0 - 36.0 g/dL Final   RDW 22/02/542711/09/2021 13.6  11.5 - 15.5 % Final   Platelets 06/05/2022 248  150 - 400 K/uL Final   nRBC 06/05/2022 0.0  0.0 - 0.2 % Final   Performed at [facility], [address]  [place], [place] [address]   Opiates 06/05/2022 NONE DETECTED  NONE DETECTED Final   Cocaine 06/05/2022 NONE DETECTED  NONE DETECTED Final   Benzodiazepines 06/05/2022 NONE DETECTED  NONE DETECTED Final   Amphetamines 06/05/2022 POSITIVE (A)  NONE DETECTED Final   Tetrahydrocannabinol 06/05/2022 NONE DETECTED  NONE DETECTED Final   Barbiturates 06/05/2022 NONE DETECTED  NONE DETECTED Final   Comment: (NOTE) DRUG SCREEN FOR MEDICAL PURPOSES ONLY.  IF CONFIRMATION IS NEEDED FOR ANY PURPOSE, NOTIFY LAB WITHIN 5 DAYS.  LOWEST DETECTABLE LIMITS FOR URINE DRUG SCREEN Drug Class                     Cutoff (ng/mL) Amphetamine and metabolites    1000 Barbiturate and metabolites    200 Benzodiazepine                 200 Opiates and metabolites        300 Cocaine and metabolites        300 THC                            50 Performed at [facility], [address] [place], [place] [address]    Alcohol, Ethyl (B) 06/05/2022 <10  <10 mg/dL Final   Comment: (NOTE) Lowest detectable limit for serum alcohol is 10 mg/dL.  For medical purposes only. Performed at [facility], [address] [place], [place] [address]   Admission on 05/10/2022, Discharged on 05/10/2022  Component Date Value Ref Range Status   Opiates 05/10/2022 NONE DETECTED  NONE DETECTED Final   Cocaine 05/10/2022 POSITIVE (A)  NONE DETECTED Final   Benzodiazepines 05/10/2022 NONE DETECTED  NONE DETECTED Final   Amphetamines 05/10/2022 NONE DETECTED  NONE DETECTED Final   Tetrahydrocannabinol 05/10/2022 NONE DETECTED  NONE DETECTED Final   Barbiturates 05/10/2022 NONE DETECTED  NONE DETECTED Final   Comment: (NOTE) DRUG SCREEN FOR MEDICAL PURPOSES ONLY.  IF CONFIRMATION IS NEEDED FOR ANY PURPOSE, NOTIFY LAB WITHIN 5 DAYS.  LOWEST DETECTABLE LIMITS FOR URINE DRUG SCREEN Drug Class                     Cutoff (ng/mL) Amphetamine and metabolites    1000 Barbiturate and metabolites     200 Benzodiazepine                 200 Tricyclics and metabolites     300 Opiates and metabolites        300 Cocaine and metabolites        300 THC                            50 Performed at [facility], [address] [place], [place] [address]    Alcohol, Ethyl (B) 05/10/2022 <10  <10 mg/dL Final   Comment: (NOTE) Lowest detectable limit for serum alcohol is 10 mg/dL.  For medical purposes only. Performed at [facility], [address] [place], [place] [address]    Sodium 05/10/2022 137  135 - 145 mmol/L Final   Potassium 05/10/2022 4.2  3.5 - 5.1 mmol/L Final   Chloride 05/10/2022 102  98 - 111 mmol/L Final   CO2 05/10/2022 29  22 - 32 mmol/L Final   Glucose, Bld 05/10/2022 90  70 - 99 mg/dL Final   Glucose reference range applies only to samples taken after fasting for at least 8 hours.   BUN 05/10/2022 11  6 - 20 mg/dL Final   Creatinine, Ser 05/10/2022 0.57  0.44 - 1.00 mg/dL Final   Calcium 09/81/1914 8.7 (L)  8.9 - 10.3 mg/dL Final   Total Protein 78/29/5621 7.2  6.5 - 8.1 g/dL Final   Albumin 30/86/5784 3.7  3.5 - 5.0 g/dL Final   AST 69/62/9528 18  15 - 41 U/L Final   ALT 05/10/2022 16  0 - 44 U/L Final   Alkaline Phosphatase 05/10/2022 97  38 - 126 U/L Final   Total Bilirubin 05/10/2022 0.5  0.3 - 1.2 mg/dL Final   GFR, Estimated 05/10/2022 >60  >60 mL/min Final   Comment: (NOTE) Calculated using the CKD-EPI Creatinine Equation (2021)    Anion gap 05/10/2022 6  5 - 15 Final   Performed at [facility], [address], [place], [place] [address]   WBC 05/10/2022 6.4  4.0 - 10.5 K/uL Final   RBC 05/10/2022 5.56 (H)  3.87 - 5.11 MIL/uL Final   Hemoglobin 05/10/2022 16.3 (H)  12.0 - 15.0 g/dL Final   HCT 40/05/2724 50.7 (H)  36.0 - 46.0 % Final   MCV 05/10/2022 91.2  80.0 - 100.0 fL Final   MCH 05/10/2022 29.3  26.0 - 34.0 pg Final   MCHC 05/10/2022 32.1  30.0 - 36.0 g/dL Final   RDW 36/64/4034 13.2  11.5  - 15.5 % Final   Platelets 05/10/2022 250  150 - 400 K/uL Final   nRBC 05/10/2022 0.0  0.0 - 0.2 % Final   Neutrophils Relative % 05/10/2022 49  % Final   Neutro Abs 05/10/2022 3.2  1.7 - 7.7 K/uL Final  Lymphocytes Relative 05/10/2022 40  % Final   Lymphs Abs 05/10/2022 2.6  0.7 - 4.0 K/uL Final   Monocytes Relative 05/10/2022 8  % Final   Monocytes Absolute 05/10/2022 0.5  0.1 - 1.0 K/uL Final   Eosinophils Relative 05/10/2022 1  % Final   Eosinophils Absolute 05/10/2022 0.0  0.0 - 0.5 K/uL Final   Basophils Relative 05/10/2022 1  % Final   Basophils Absolute 05/10/2022 0.0  0.0 - 0.1 K/uL Final   Immature Granulocytes 05/10/2022 1  % Final   Abs Immature Granulocytes 05/10/2022 0.04  0.00 - 0.07 K/uL Final   Performed at [facility], [address], [place], [place] [address]   Lipase 05/10/2022 34  11 - 51 U/L Final   Performed at [facility], [address], [place], [place] [address]  Admission on 05/08/2022, Discharged on 05/09/2022  Component Date Value Ref Range Status   SARS Coronavirus 2 by RT PCR 05/08/2022 NEGATIVE  NEGATIVE Final   Comment: (NOTE) SARS-CoV-2 target nucleic acids are NOT DETECTED.  The SARS-CoV-2 RNA is generally detectable in upper respiratory specimens during the acute phase of infection. The lowest concentration of SARS-CoV-2 viral copies this assay can detect is 138 copies/mL. A negative result does not preclude SARS-Cov-2 infection and should not be used as the sole basis for treatment or other patient management decisions. A negative result may occur with  improper specimen collection/handling, submission of specimen other than nasopharyngeal swab, presence of viral mutation(s) within the areas targeted by this assay, and inadequate number of viral copies(<138 copies/mL). A negative result must be combined with clinical observations, patient history, and epidemiological information. The expected result is  Negative.  Fact Sheet for Patients:  BloggerCourse.com  Fact Sheet for Healthcare Providers:  SeriousBroker.it  This test is no                          t yet approved or cleared by the [place] FDA and  has been authorized for detection and/or diagnosis of SARS-CoV-2 by FDA under an Emergency Use Authorization (EUA). This EUA will remain  in effect (meaning this test can be used) for the duration of the COVID-19 declaration under Section 564(b)(1) of the Act, 21 U.S.C.section 360bbb-3(b)(1), unless the authorization is terminated  or revoked sooner.       Influenza A by PCR 05/08/2022 NEGATIVE  NEGATIVE Final   Influenza B by PCR 05/08/2022 NEGATIVE  NEGATIVE Final   Comment: (NOTE) The Xpert Xpress SARS-CoV-2/FLU/RSV plus assay is intended as an aid in the diagnosis of influenza from Nasopharyngeal swab specimens and should not be used as a sole basis for treatment. Nasal washings and aspirates are unacceptable for Xpert Xpress SARS-CoV-2/FLU/RSV testing.  Fact Sheet for Patients: BloggerCourse.com  Fact Sheet for Healthcare Providers: SeriousBroker.it  This test is not yet approved or cleared by the [place] FDA and has been authorized for detection and/or diagnosis of SARS-CoV-2 by FDA under an Emergency Use Authorization (EUA). This EUA will remain in effect (meaning this test can be used) for the duration of the COVID-19 declaration under Section 564(b)(1) of the Act, 21 U.S.C. section 360bbb-3(b)(1), unless the authorization is terminated or revoked.  Performed at [facility], [address] [place], [place] [address]    WBC 05/08/2022 6.5  4.0 - 10.5 K/uL Final   RBC 05/08/2022 5.36 (H)  3.87 - 5.11 MIL/uL Final   Hemoglobin 05/08/2022  16.1 (H)  12.0 - 15.0 g/dL Final   HCT 16/05/9603 47.9 (H)  36.0 - 46.0 % Final   MCV 05/08/2022 89.4  80.0 -  100.0 fL Final   MCH 05/08/2022 30.0  26.0 - 34.0 pg Final   MCHC 05/08/2022 33.6  30.0 - 36.0 g/dL Final   RDW 54/04/8118 13.2  11.5 - 15.5 % Final   Platelets 05/08/2022 237  150 - 400 K/uL Final   nRBC 05/08/2022 0.0  0.0 - 0.2 % Final   Neutrophils Relative % 05/08/2022 37  % Final   Neutro Abs 05/08/2022 2.4  1.7 - 7.7 K/uL Final   Lymphocytes Relative 05/08/2022 52  % Final   Lymphs Abs 05/08/2022 3.4  0.7 - 4.0 K/uL Final   Monocytes Relative 05/08/2022 8  % Final   Monocytes Absolute 05/08/2022 0.5  0.1 - 1.0 K/uL Final   Eosinophils Relative 05/08/2022 1  % Final   Eosinophils Absolute 05/08/2022 0.1  0.0 - 0.5 K/uL Final   Basophils Relative 05/08/2022 1  % Final   Basophils Absolute 05/08/2022 0.0  0.0 - 0.1 K/uL Final   Immature Granulocytes 05/08/2022 1  % Final   Abs Immature Granulocytes 05/08/2022 0.05  0.00 - 0.07 K/uL Final   Performed at [facility], [address] [place], [place] [address]   Sodium 05/08/2022 135  135 - 145 mmol/L Final   Potassium 05/08/2022 3.9  3.5 - 5.1 mmol/L Final   Chloride 05/08/2022 100  98 - 111 mmol/L Final   CO2 05/08/2022 27  22 - 32 mmol/L Final   Glucose, Bld 05/08/2022 93  70 - 99 mg/dL Final   Glucose reference range applies only to samples taken after fasting for at least 8 hours.   BUN 05/08/2022 8  6 - 20 mg/dL Final   Creatinine, Ser 05/08/2022 0.68  0.44 - 1.00 mg/dL Final   Calcium 95/62/1308 9.0  8.9 - 10.3 mg/dL Final   Total Protein 65/78/4696 6.6  6.5 - 8.1 g/dL Final   Albumin 29/52/8413 3.7  3.5 - 5.0 g/dL Final   AST 24/40/1027 17  15 - 41 U/L Final   ALT 05/08/2022 17  0 - 44 U/L Final   Alkaline Phosphatase 05/08/2022 111  38 - 126 U/L Final   Total Bilirubin 05/08/2022 0.6  0.3 - 1.2 mg/dL Final   GFR, Estimated 05/08/2022 >60  >60 mL/min Final   Comment: (NOTE) Calculated using the CKD-EPI Creatinine Equation (2021)    Anion gap 05/08/2022 8  5 - 15 Final   Performed at [facility], [address], [place], [place] [address]   Hgb A1c MFr Bld 05/08/2022 4.9  4.8 - 5.6 % Final   Comment: (NOTE) Pre diabetes:          5.7%-6.4%  Diabetes:              >6.4%  Glycemic control for   <7.0% adults with diabetes    Mean Plasma Glucose 05/08/2022 93.93  mg/dL Final   Performed at [facility], [address] [place], [place] [address]   Preg Test, Ur 05/08/2022 NEGATIVE  NEGATIVE Final   Performed at [facility], [address], [place], [place] [address]   POC Amphetamine UR 05/08/2022 None Detected  NONE DETECTED (Cut Off Level 1000 ng/mL) Final   POC Secobarbital (BAR) 05/08/2022 None Detected  NONE DETECTED (Cut Off Level 300 ng/mL) Final   POC Buprenorphine (  BUP) 05/08/2022 None Detected  NONE DETECTED (Cut Off Level 10 ng/mL) Final   POC Oxazepam (BZO) 05/08/2022 None Detected  NONE DETECTED (Cut Off Level 300 ng/mL) Final   POC Cocaine UR 05/08/2022 None Detected  NONE DETECTED (Cut Off Level 300 ng/mL) Final   POC Methamphetamine UR 05/08/2022 Positive (A)  NONE DETECTED (Cut Off Level 1000 ng/mL) Final   POC Morphine 05/08/2022 None Detected  NONE DETECTED (Cut Off Level 300 ng/mL) Final   POC Methadone UR 05/08/2022 None Detected  NONE DETECTED (Cut Off Level 300 ng/mL) Final   POC Oxycodone UR 05/08/2022 None Detected  NONE DETECTED (Cut Off Level 100 ng/mL) Final   POC Marijuana UR 05/08/2022 None Detected  NONE DETECTED (Cut Off Level 50 ng/mL) Final   SARSCOV2ONAVIRUS 2 AG 05/08/2022 NEGATIVE  NEGATIVE Final   Comment: (NOTE) SARS-CoV-2 antigen NOT DETECTED.   Negative results are presumptive.  Negative results do not preclude SARS-CoV-2 infection and should not be used as the sole basis for treatment or other patient management decisions, including infection  control decisions, particularly in the presence of clinical signs and  symptoms consistent with COVID-19, or in those who have been in contact with the virus.  Negative results must be combined  with clinical observations, patient history, and epidemiological information. The expected result is Negative.  Fact Sheet for Patients: https://www.jennings-kim.com/https://www.fda.gov/media/141569/download  Fact Sheet for Healthcare Providers: https://alexander-rogers.biz/https://www.fda.gov/media/141568/download  This test is not yet approved or cleared by the [place] FDA and  has been authorized for detection and/or diagnosis of SARS-CoV-2 by FDA under an Emergency Use Authorization (EUA).  This EUA will remain in effect (meaning this test can be used) for the duration of  the COV                          ID-19 declaration under Section 564(b)(1) of the Act, 21 U.S.C. section 360bbb-3(b)(1), unless the authorization is terminated or revoked sooner.     Preg Test, Ur 05/08/2022 NEGATIVE  NEGATIVE Final   Comment:        THE SENSITIVITY OF THIS METHODOLOGY IS >24 mIU/mL    Cholesterol 05/08/2022 187  0 - 200 mg/dL Final   Triglycerides 40/98/119110/12/2021 117  <150 mg/dL Final   HDL 47/82/956210/12/2021 67  >40 mg/dL Final   Total CHOL/HDL Ratio 05/08/2022 2.8  RATIO Final   VLDL 05/08/2022 23  0 - 40 mg/dL Final   LDL Cholesterol 05/08/2022 97  0 - 99 mg/dL Final   Comment:        Total Cholesterol/HDL:CHD Risk Coronary Heart Disease Risk Table                     Men   Women  1/2 Average Risk   3.4   3.3  Average Risk       5.0   4.4  2 X Average Risk   9.6   7.1  3 X Average Risk  23.4   11.0        Use the calculated Patient Ratio above and the CHD Risk Table to determine the patient's CHD Risk.        ATP III CLASSIFICATION (LDL):  <100     mg/dL   Optimal  130-865100-129  mg/dL   Near or Above                    Optimal  130-159  mg/dL   Borderline  160-189  mg/dL   High  >161     mg/dL   Very High Performed at [facility], [address], [place], [place] [address]    TSH 05/08/2022 3.264  0.350 - 4.500 uIU/mL Final   Comment: Performed by a 3rd Generation assay with a functional sensitivity of <=0.01 uIU/mL. Performed at  [facility], [address], [place], [place] [address]   There may be more visits with results that are not included.    Blood Alcohol level:  Lab Results  Component Value Date   ETH <10 08/20/2022   ETH 37 (H) 07/17/2022    Metabolic Disorder Labs: Lab Results  Component Value Date   HGBA1C 5.4 08/20/2022   MPG 108.28 08/20/2022   MPG 93.93 05/08/2022   No results found for: "PROLACTIN" Lab Results  Component Value Date   CHOL 180 08/20/2022   TRIG 181 (H) 08/20/2022   HDL 77 08/20/2022   CHOLHDL 2.3 08/20/2022   VLDL 36 08/20/2022   LDLCALC 67 08/20/2022   LDLCALC 97 05/08/2022    Therapeutic Lab Levels: No results found for: "LITHIUM" No results found for: "VALPROATE" No results found for: "CBMZ"  Physical Findings   AIMS    Flowsheet Row ED to [facility]osp[facility] ([facility]  AIMS Total Score 0      AUDIT    Flowsheet Row ED to Hosp-Admission (Discharged) [facility]  Alcohol Use Disorder Identification Test Final Score (AUDIT) 32      CAGE-AID    Flowsheet Row ED from 01/17/2022 in [facility] at [facility]  CAGE-AID Score 1      GAD-7    Flowsheet Row Office Visit from 03/12/2021 in [facility]  Total GAD-7 Score 20      PHQ2-9    Flowsheet Row ED from 08/20/2022 in [facility] ED from 07/17/2022 in [facility] ED from 05/08/2022 in [facility] Office Visit from 03/12/2021 in [facility] 1  PHQ-2 Total Score PHQ-9 Total Score Flowsheet Row ED from 08/20/2022 in [facility] ED from 07/23/2022 in [facility] ED from 07/22/2022 in [facility] Emergency Department at [facility]  C-SSRS RISK CATEGORY No Risk  Low Risk No Risk        Musculoskeletal  Strength & Muscle Tone: within normal limits Gait & Station: normal Patient leans: N/A  Psychiatric Specialty Exam  Presentation  General Appearance:  Appropriate for Environment; Casual  Eye Contact: Fair  Speech: Clear and Coherent  Speech Volume: Normal  Handedness: Right   Mood and Affect  Mood: Anxious  Affect: Depressed   Thought Process  Thought Processes: Goal Directed  Descriptions of Associations:Intact  Orientation:Full (Time, Place and Person)  Thought Content:Perseveration  Diagnosis of Schizophrenia or Schizoaffective disorder in past: Yes  Duration of Psychotic Symptoms: Greater than six months   Hallucinations:Hallucinations: Visual; Auditory Description of Auditory Hallucinations: : i hear people talking about me and i also hear 4 voices in my head, they don't alays say comforting things." Description of Visual Hallucinations: sees faces in food and objects  Ideas of Reference:Paranoia  Suicidal Thoughts:Suicidal Thoughts: No  Homicidal Thoughts:Homicidal Thoughts: No   Sensorium  Memory: Immediate Fair; Recent  Fair  Judgment: Fair  Insight: None   Chartered certified accountant: Fair  Attention Span: Poor  Recall: [employer] of Knowledge: Poor  Language: Fair   Lexicographer Activity: Psychomotor Activity: Decreased   Assets  Assets: Resilience   Sleep  Sleep: Sleep: Poor   No data recorded  Physical Exam  Physical Exam HENT:     Head: Normocephalic and atraumatic.  Pulmonary:     Effort: Pulmonary effort is normal.  Neurological:     Mental Status: She is alert and oriented to person, place, and time.    Review of Systems  Neurological:  Negative for tremors.  Psychiatric/Behavioral:  Positive for depression and hallucinations. Negative for suicidal ideas. The patient is nervous/anxious and has insomnia.    Blood  pressure 101/76, pulse 74, temperature 98 F (36.7 C), temperature source Oral, resp. rate 17, height 5\' 3"  (1.6 m), weight 110 lb (49.9 kg), SpO2 100 %. Body mass index is 19.49 kg/m.  Treatment Plan Summary: Daily contact with patient to assess and evaluate symptoms and progress in treatment and Medication management   At this time patient is not endorsing withdrawal symptoms and appears to be more endorsing psychosis symptoms related to a schizoaffective disorder and poor med compliance. Patient does have insight and communicates that her substance use is generally attempts to self-medicate to get rid of the AH.  Would continue Abilify at current dose and start Cogentin for possible akathisia. There is still concern for malingering as patient also constantly endorses low mood and directly relates this to her homelessness and uncertainty.    Stimulant use disorder, severe - Continue to monitor   Hx of bipolar disorder - Continue Abilify 10mg  daily - Continue Lexapro 10mg  daily   Hx of Etoh use disorder - Modified Librium taper starting at 25mg  BID - CIWA, last CIWA "0" 1/21  Hx of saddle Pulm embolus and DVT - Continue Xarelto 20mg  - Consult to pharmacy about medication assistance   PGY-3 , MD 08/24/2022 2:17 PM

## 2022-08-25 ENCOUNTER — Other Ambulatory Visit (HOSPITAL_COMMUNITY): Payer: Self-pay

## 2022-08-25 DIAGNOSIS — Z5900 Homelessness unspecified: Secondary | ICD-10-CM | POA: Diagnosis not present

## 2022-08-25 DIAGNOSIS — F1094 Alcohol use, unspecified with alcohol-induced mood disorder: Secondary | ICD-10-CM | POA: Diagnosis not present

## 2022-08-25 DIAGNOSIS — Z1152 Encounter for screening for COVID-19: Secondary | ICD-10-CM | POA: Diagnosis not present

## 2022-08-25 DIAGNOSIS — F1910 Other psychoactive substance abuse, uncomplicated: Secondary | ICD-10-CM | POA: Diagnosis not present

## 2022-08-25 NOTE — ED Notes (Signed)
Pt is in the bed sleeping. Respirations are even and unlabored. No acute distress noted. Will continue to monitor for safety. 

## 2022-08-25 NOTE — ED Notes (Signed)
Pt sleeping in no acute distress. RR even and unlabored. Environment secured. Will continue to monitor for safety. 

## 2022-08-25 NOTE — ED Notes (Signed)
Pt asleep in bed. Respirations even and unlabored. Monitoring for safety. 
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
## 2022-08-25 NOTE — ED Notes (Signed)
Patient A&Ox4. Denies intent to harm self/others when asked. Denies A/VH. Patient denies any physical complaints when asked. No acute distress noted. Routine safety checks conducted according to facility protocol. Encouraged patient to notify staff if thoughts of harm toward self or others arise. Patient verbalize understanding and agreement. Will continue to monitor for safety.    

## 2022-08-25 NOTE — ED Notes (Signed)
In AA group meeting 

## 2022-08-25 NOTE — ED Provider Notes (Signed)
Behavioral Health Progress Note  Date and Time: 08/25/2022 2:06 PM Name: Kristina Owens MRN:  [id number]  Subjective:   The patient is a 57 year old female well-known to this service line.  She has a history of cocaine, alcohol, and methamphetamine use disorders.  She has been homeless for some time.  [facility]  The last 1 was in December, when she was discharged [facility].  She reported to LCSW that she was denied because she did not have her Xarelto.  On initial interview the patient appeared to have physical symptoms, possibly of methamphetamine withdrawal (the only substance for which she was positive in her UDS).  Started on Librium taper due to concern for alcohol withdrawal.  Patient accepted at [facility] in [place].  Discharge date is Thursday morning.  Patient will need 14-day supply of medication and paper scripts.  Her sister is going to provide her transportation, getting a ride share at 7:30 AM so the patient can arrive by 10 AM.   The patient denies auditory/visual hallucinations and first rank symptoms. They deny suicidal and homicidal thoughts. The patient denies side effects from their medications.  Review of systems as below.    Diagnosis:  Final diagnoses:  Alcohol use disorder, severe, dependence (HCC)  Alcohol-induced mood disorder with depressive symptoms (HCC)  Polysubstance abuse (HCC)  Homelessness    Total Time spent with patient: 20 minutes  Past Psychiatric History: as above Past Medical History:  Past Medical History:  Diagnosis Date   Anxiety    Depression    DVT (deep vein thrombosis) in pregnancy    GERD (gastroesophageal reflux disease)    Ovarian cyst     Past Surgical History:  Procedure Laterality Date   APPENDECTOMY  age 57   IR PTA VENOUS EXCEPT DIALYSIS CIRCUIT  01/16/2021   IR RADIOLOGIST EVAL & MGMT  03/19/2021   IR THROMBECT VENO MECH MOD SED  01/16/2021   IR US  GUIDE VASC ACCESS LEFT  01/16/2021   IR VENO/EXT/UNI LEFT  01/16/2021   IR VENOCAVAGRAM IVC  01/16/2021   TONSILLECTOMY Bilateral age 57   Family History:  Family History  Problem Relation Age of Onset   Asthma Mother    COPD Mother    Cancer Father        thyroid cancer   Family Psychiatric  History: none Social History:  Social History   Substance and Sexual Activity  Alcohol Use Yes     Social History   Substance and Sexual Activity  Drug Use Yes   Types: "Crack" cocaine, Cocaine, Methamphetamines   Comment: last used 4/31/22    Social History   Socioeconomic History   Marital status: Divorced    Spouse name: Not on file   Number of children: Not on file   Years of education: Not on file   Highest education level: Not on file  Occupational History   Not on file  Tobacco Use   Smoking status: Every Day    Packs/day: 0.50    Years: 35.00    Total pack years: 17.50    Types: Cigarettes   Smokeless tobacco: Never  Vaping Use   Vaping Use: Never used  Substance and Sexual Activity   Alcohol use: Yes   Drug use: Yes    Types: "Crack" cocaine, Cocaine, Methamphetamines    Comment: last used 4/31/22   Sexual activity: Not on file  Other Topics Concern  Not on file  Social History Narrative   Not on file   Social Determinants of Health   Financial Resource Strain: Not on file  Food Insecurity: Not on file  Transportation Needs: Not on file  Physical Activity: Not on file  Stress: Not on file  Social Connections: Not on file   SDOH:  SDOH Screenings   Alcohol Screen: High Risk (03/10/2022)  Depression (PHQ2-9): High Risk (08/24/2022)  Tobacco Use: High Risk (08/20/2022)   Additional Social History:    Pain Medications: See MAR Prescriptions: See MAR Over the Counter: See MAR History of alcohol / drug use?: Yes Longest period of sobriety (when/how long): 2 months Negative Consequences of Use: Legal Withdrawal Symptoms: Nausea / Vomiting, Tremors,  Fever / Chills, Sweats, Other (Comment) (Headache) Name of Substance 1: Alcohol 1 - Age of First Use: 16 1 - Amount (size/oz): 1 pint 1 - Frequency: daily 1 - Duration: ongoing 1 - Last Use / Amount: 2 hours ago 1 - Method of Aquiring: purchase 1- Route of Use: drinking Name of Substance 2: Methamphetamine 2 - Age of First Use: 47 2 - Amount (size/oz): $20 worth 2 - Frequency: Weekly 2 - Duration: Ongoing 2 - Last Use / Amount: 5 days ago 2 - Method of Aquiring: UTA 2 - Route of Substance Use: intervenous                Sleep: Fair  Appetite:  Fair  Current Medications:  Current Facility-Administered Medications  Medication Dose Route Frequency Provider Last Rate Last Admin   acetaminophen (TYLENOL) tablet 650 mg  650 mg Oral Q6H PRN Rankin, Shuvon B, NP   650 mg at 08/22/22 0920   alum & mag hydroxide-simeth (MAALOX/MYLANTA) 200-200-20 MG/5ML suspension 30 mL  30 mL Oral Q4H PRN Rankin, Shuvon B, NP       ARIPiprazole (ABILIFY) tablet 10 mg  10 mg Oral Daily Rankin, Shuvon B, NP   10 mg at 08/25/22 0917   benztropine (COGENTIN) tablet 0.5 mg  0.5 mg Oral Daily Eliseo GumMcQuilla, Jai B, MD   0.5 mg at 08/25/22 0916   escitalopram (LEXAPRO) tablet 10 mg  10 mg Oral Daily Rankin, Shuvon B, NP   10 mg at 08/25/22 0917   fluticasone (FLONASE) 50 MCG/ACT nasal spray 1 spray  1 spray Each Nare Daily Carlyn ReichertGabrielle, Simar Pothier, MD   1 spray at 08/25/22 0919   hydrOXYzine (ATARAX) tablet 10 mg  10 mg Oral TID Eliseo GumMcQuilla, Jai B, MD   10 mg at 08/25/22 [id number]   magnesium hydroxide (MILK OF MAGNESIA) suspension 30 mL  30 mL Oral Daily PRN Rankin, Shuvon B, NP       multivitamin with minerals tablet 1 tablet  1 tablet Oral Daily Rankin, Shuvon B, NP   1 tablet at 08/25/22 0917   nicotine (NICODERM CQ - dosed in mg/24 hours) patch 21 mg  21 mg Transdermal Daily Rankin, Shuvon B, NP   21 mg at 08/25/22 [id number]   rivaroxaban (XARELTO) tablet 20 mg  20 mg Oral Daily Rankin, Shuvon B, NP   20 mg at 08/25/22 0916    thiamine (VITAMIN B1) tablet 100 mg  100 mg Oral Daily Carlyn ReichertGabrielle, Arita Severtson, MD   100 mg at 08/25/22 0916   traZODone (DESYREL) tablet 50 mg  50 mg Oral QHS PRN Rankin, Shuvon B, NP   50 mg at 08/22/22 2132   Current Outpatient Medications  Medication Sig Dispense Refill   ARIPiprazole (ABILIFY) 10 MG tablet Take  1 tablet (10 mg total) by mouth daily. 30 tablet 0   escitalopram (LEXAPRO) 10 MG tablet Take 1 tablet (10 mg total) by mouth daily. 30 tablet 0   hydrOXYzine (ATARAX) 25 MG tablet Take 1 tablet (25 mg total) by mouth 3 (three) times daily as needed for anxiety. 30 tablet 0   RIVAROXABAN (XARELTO) VTE STARTER PACK (15 & 20 MG) Follow package directions: Take one 15mg  tablet by mouth twice a day. On day 22, switch to one 20mg  tablet once a day. Take with food. (Patient taking differently: Take 15-20 mg by mouth See admin instructions. Follow package directions: Take one 15mg  tablet by mouth twice a day. On day 22, switch to one 20mg  tablet once a day. Take with food.) 51 each 0    Labs  Lab Results:  Admission on 08/20/2022  Component Date Value Ref Range Status   SARS Coronavirus 2 by RT PCR 08/20/2022 NEGATIVE  NEGATIVE Final   Comment: (NOTE) SARS-CoV-2 target nucleic acids are NOT DETECTED.  The SARS-CoV-2 RNA is generally detectable in upper respiratory specimens during the acute phase of infection. The lowest concentration of SARS-CoV-2 viral copies this assay can detect is 138 copies/mL. A negative result does not preclude SARS-Cov-2 infection and should not be used as the sole basis for treatment or other patient management decisions. A negative result may occur with  improper specimen collection/handling, submission of specimen other than nasopharyngeal swab, presence of viral mutation(s) within the areas targeted by this assay, and inadequate number of viral copies(<138 copies/mL). A negative result must be combined with clinical observations, patient history, and  epidemiological information. The expected result is Negative.  Fact Sheet for Patients:   Fact Sheet for Healthcare Providers:   This test is no                          t yet approved or cleared by the 08/22/2022 FDA and  has been authorized for detection and/or diagnosis of SARS-CoV-2 by FDA under an Emergency Use Authorization (EUA). This EUA will remain  in effect (meaning this test can be used) for the duration of the COVID-19 declaration under Section 564(b)(1) of the Act, 21 U.S.C.section 360bbb-3(b)(1), unless the authorization is terminated  or revoked sooner.       Influenza A by PCR 08/20/2022 NEGATIVE  NEGATIVE Final   Influenza B by PCR 08/20/2022 NEGATIVE  NEGATIVE Final   Comment: (NOTE) The Xpert Xpress SARS-CoV-2/FLU/RSV plus assay is intended as an aid in the diagnosis of influenza from Nasopharyngeal swab specimens and should not be used as a sole basis for treatment. Nasal washings and aspirates are unacceptable for Xpert Xpress SARS-CoV-2/FLU/RSV testing.  Fact Sheet for Patients: SeriousBroker.it  Fact Sheet for Healthcare Providers: [place]  This test is not yet approved or cleared by the 08/22/2022 FDA and has been authorized for detection and/or diagnosis of SARS-CoV-2 by FDA under an Emergency Use Authorization (EUA). This EUA will remain in effect (meaning this test can be used) for the duration of the COVID-19 declaration under Section 564(b)(1) of the Act, 21 U.S.C. section 360bbb-3(b)(1), unless the authorization is terminated or revoked.     Resp Syncytial Virus by PCR 08/20/2022 NEGATIVE  NEGATIVE Final   Comment: (NOTE) Fact Sheet for Patients: BloggerCourse.com  Fact Sheet for Healthcare Providers: SeriousBroker.it  This test is not yet  approved or cleared by the [place] and has been authorized for  detection and/or diagnosis of SARS-CoV-2 by FDA under an Emergency Use Authorization (EUA). This EUA will remain in effect (meaning this test can be used) for the duration of the COVID-19 declaration under Section 564(b)(1) of the Act, 21 U.S.C. section 360bbb-3(b)(1), unless the authorization is terminated or revoked.  Performed at [facility], [address] [place], [place] [address]    WBC 08/20/2022 5.9  4.0 - 10.5 K/uL Final   RBC 08/20/2022 4.89  3.87 - 5.11 MIL/uL Final   Hemoglobin 08/20/2022 15.2 (H)  12.0 - 15.0 g/dL Final   HCT 60/45/4098 44.4  36.0 - 46.0 % Final   MCV 08/20/2022 90.8  80.0 - 100.0 fL Final   MCH 08/20/2022 31.1  26.0 - 34.0 pg Final   MCHC 08/20/2022 34.2  30.0 - 36.0 g/dL Final   RDW 11/91/4782 13.5  11.5 - 15.5 % Final   Platelets 08/20/2022 294  150 - 400 K/uL Final   nRBC 08/20/2022 0.0  0.0 - 0.2 % Final   Neutrophils Relative % 08/20/2022 54  % Final   Neutro Abs 08/20/2022 3.2  1.7 - 7.7 K/uL Final   Lymphocytes Relative 08/20/2022 34  % Final   Lymphs Abs 08/20/2022 2.0  0.7 - 4.0 K/uL Final   Monocytes Relative 08/20/2022 8  % Final   Monocytes Absolute 08/20/2022 0.5  0.1 - 1.0 K/uL Final   Eosinophils Relative 08/20/2022 2  % Final   Eosinophils Absolute 08/20/2022 0.1  0.0 - 0.5 K/uL Final   Basophils Relative 08/20/2022 1  % Final   Basophils Absolute 08/20/2022 0.1  0.0 - 0.1 K/uL Final   Immature Granulocytes 08/20/2022 1  % Final   Abs Immature Granulocytes 08/20/2022 0.04  0.00 - 0.07 K/uL Final   Performed at [facility], [address], [place], [place] [address]   Sodium 08/20/2022 140  135 - 145 mmol/L Final   Potassium 08/20/2022 3.9  3.5 - 5.1 mmol/L Final   Chloride 08/20/2022 105  98 - 111 mmol/L Final   CO2 08/20/2022 24  22 - 32 mmol/L Final   Glucose, Bld 08/20/2022 84  70 - 99 mg/dL Final   Glucose reference range applies only to  samples taken after fasting for at least 8 hours.   BUN 08/20/2022 5 (L)  6 - 20 mg/dL Final   Creatinine, Ser 08/20/2022 0.60  0.44 - 1.00 mg/dL Final   Calcium 30/86/5784 9.3  8.9 - 10.3 mg/dL Final   Total Protein 69/62/9528 6.8  6.5 - 8.1 g/dL Final   Albumin 41/32/4401 3.9  3.5 - 5.0 g/dL Final   AST 02/72/5366 16  15 - 41 U/L Final   ALT 08/20/2022 15  0 - 44 U/L Final   Alkaline Phosphatase 08/20/2022 91  38 - 126 U/L Final   Total Bilirubin 08/20/2022 0.2 (L)  0.3 - 1.2 mg/dL Final   GFR, Estimated 08/20/2022 >60  >60 mL/min Final   Comment: (NOTE) Calculated using the CKD-EPI Creatinine Equation (2021)    Anion gap 08/20/2022 11  5 - 15 Final   Performed at [facility], [address], [place], [place] [address]   Hgb A1c MFr Bld 08/20/2022 5.4  4.8 - 5.6 % Final   Comment: (NOTE) Pre diabetes:          5.7%-6.4%  Diabetes:              >6.4%  Glycemic control for   <7.0% adults with  diabetes    Mean Plasma Glucose 08/20/2022 108.28  mg/dL Final   Performed at [facility], [address] [place], [place] [address]   Magnesium 08/20/2022 1.9  1.7 - 2.4 mg/dL Final   Performed at [facility], [address], [place], [place] [address]   Alcohol, Ethyl (B) 08/20/2022 <10  <10 mg/dL Final   Comment: (NOTE) Lowest detectable limit for serum alcohol is 10 mg/dL.  For medical purposes only. Performed at [facility], [address], [place], [place] [address]    Cholesterol 08/20/2022 180  0 - 200 mg/dL Final   Triglycerides 19/41/7408 181 (H)  <150 mg/dL Final   HDL 14/48/1856 77  >40 mg/dL Final   Total CHOL/HDL Ratio 08/20/2022 2.3  RATIO Final   VLDL 08/20/2022 36  0 - 40 mg/dL Final   LDL Cholesterol 08/20/2022 67  0 - 99 mg/dL Final   Comment:        Total Cholesterol/HDL:CHD Risk Coronary Heart Disease Risk Table                     Men   Women  1/2 Average Risk   3.4   3.3  Average Risk       5.0   4.4  2 X Average Risk   9.6    7.1  3 X Average Risk  23.4   11.0        Use the calculated Patient Ratio above and the CHD Risk Table to determine the patient's CHD Risk.        ATP III CLASSIFICATION (LDL):  <100     mg/dL   Optimal  314-970  mg/dL   Near or Above                    Optimal  130-159  mg/dL   Borderline  263-785  mg/dL   High  >885     mg/dL   Very High Performed at [facility], [address], [place], [place] [address]    Color, Urine 08/20/2022 YELLOW  YELLOW Final   APPearance 08/20/2022 CLEAR  CLEAR Final   Specific Gravity, Urine 08/20/2022 1.020  1.005 - 1.030 Final   pH 08/20/2022 5.0  5.0 - 8.0 Final   Glucose, UA 08/20/2022 NEGATIVE  NEGATIVE mg/dL Final   Hgb urine dipstick 08/20/2022 NEGATIVE  NEGATIVE Final   Bilirubin Urine 08/20/2022 NEGATIVE  NEGATIVE Final   Ketones, ur 08/20/2022 NEGATIVE  NEGATIVE mg/dL Final   Protein, ur 12/87/8676 NEGATIVE  NEGATIVE mg/dL Final   Nitrite 72/04/4708 NEGATIVE  NEGATIVE Final   Leukocytes,Ua 08/20/2022 NEGATIVE  NEGATIVE Final   Performed at [facility], [address], [place], [place] [address]   Preg Test, Ur 08/20/2022 Negative  Negative Final   POC Amphetamine UR 08/20/2022 None Detected  NONE DETECTED (Cut Off Level 1000 ng/mL) Final   POC Secobarbital (BAR) 08/20/2022 None Detected  NONE DETECTED (Cut Off Level 300 ng/mL) Final   POC Buprenorphine (BUP) 08/20/2022 None Detected  NONE DETECTED (Cut Off Level 10 ng/mL) Final   POC Oxazepam (BZO) 08/20/2022 None Detected  NONE DETECTED (Cut Off Level 300 ng/mL) Final   POC Cocaine UR 08/20/2022 None Detected  NONE DETECTED (Cut Off Level 300 ng/mL) Final   POC Methamphetamine UR 08/20/2022 Positive (A)  NONE DETECTED (Cut Off Level 1000 ng/mL) Final   POC Morphine 08/20/2022 None Detected  NONE DETECTED (  Cut Off Level 300 ng/mL) Final   POC Methadone UR 08/20/2022 None Detected  NONE DETECTED (Cut Off Level 300 ng/mL) Final   POC Oxycodone UR 08/20/2022 None Detected  NONE  DETECTED (Cut Off Level 100 ng/mL) Final   POC Marijuana UR 08/20/2022 None Detected  NONE DETECTED (Cut Off Level 50 ng/mL) Final   HIV Screen 4th Generation wRfx 08/20/2022 Non Reactive  Non Reactive Final   Performed at [facility], [address] [place], [place] [address]   SARSCOV2ONAVIRUS 2 AG 08/20/2022 NEGATIVE  NEGATIVE Final   Comment: (NOTE) SARS-CoV-2 antigen NOT DETECTED.   Negative results are presumptive.  Negative results do not preclude SARS-CoV-2 infection and should not be used as the sole basis for treatment or other patient management decisions, including infection  control decisions, particularly in the presence of clinical signs and  symptoms consistent with COVID-19, or in those who have been in contact with the virus.  Negative results must be combined with clinical observations, patient history, and epidemiological information. The expected result is Negative.  Fact Sheet for Patients: https://www.jennings-kim.com/  Fact Sheet for Healthcare Providers: https://alexander-rogers.biz/  This test is not yet approved or cleared by the [place] FDA and  has been authorized for detection and/or diagnosis of SARS-CoV-2 by FDA under an Emergency Use Authorization (EUA).  This EUA will remain in effect (meaning this test can be used) for the duration of  the COV                          ID-19 declaration under Section 564(b)(1) of the Act, 21 U.S.C. section 360bbb-3(b)(1), unless the authorization is terminated or revoked sooner.     Preg Test, Ur 08/20/2022 NEGATIVE  NEGATIVE Final   Comment:        THE SENSITIVITY OF THIS METHODOLOGY IS >24 mIU/mL    SARS Coronavirus 2 by RT PCR 08/22/2022 NEGATIVE  NEGATIVE Final   Comment: (NOTE) SARS-CoV-2 target nucleic acids are NOT DETECTED.  The SARS-CoV-2 RNA is generally detectable in upper respiratory specimens during the acute phase of infection. The lowest concentration of  SARS-CoV-2 viral copies this assay can detect is 138 copies/mL. A negative result does not preclude SARS-Cov-2 infection and should not be used as the sole basis for treatment or other patient management decisions. A negative result may occur with  improper specimen collection/handling, submission of specimen other than nasopharyngeal swab, presence of viral mutation(s) within the areas targeted by this assay, and inadequate number of viral copies(<138 copies/mL). A negative result must be combined with clinical observations, patient history, and epidemiological information. The expected result is Negative.  Fact Sheet for Patients:  BloggerCourse.com  Fact Sheet for Healthcare Providers:  SeriousBroker.it  This test is no                          t yet approved or cleared by the [place] FDA and  has been authorized for detection and/or diagnosis of SARS-CoV-2 by FDA under an Emergency Use Authorization (EUA). This EUA will remain  in effect (meaning this test can be used) for the duration of the COVID-19 declaration under Section 564(b)(1) of the Act, 21 U.S.C.section 360bbb-3(b)(1), unless the authorization is terminated  or revoked sooner.       Influenza A by PCR 08/22/2022 NEGATIVE  NEGATIVE Final   Influenza B by PCR 08/22/2022 NEGATIVE  NEGATIVE Final   Comment: (  NOTE) The Xpert Xpress SARS-CoV-2/FLU/RSV plus assay is intended as an aid in the diagnosis of influenza from Nasopharyngeal swab specimens and should not be used as a sole basis for treatment. Nasal washings and aspirates are unacceptable for Xpert Xpress SARS-CoV-2/FLU/RSV testing.  Fact Sheet for Patients: BloggerCourse.com  Fact Sheet for Healthcare Providers: SeriousBroker.it  This test is not yet approved or cleared by the [place] FDA and has been authorized for detection and/or diagnosis of  SARS-CoV-2 by FDA under an Emergency Use Authorization (EUA). This EUA will remain in effect (meaning this test can be used) for the duration of the COVID-19 declaration under Section 564(b)(1) of the Act, 21 U.S.C. section 360bbb-3(b)(1), unless the authorization is terminated or revoked.     Resp Syncytial Virus by PCR 08/22/2022 NEGATIVE  NEGATIVE Final   Comment: (NOTE) Fact Sheet for Patients: BloggerCourse.com  Fact Sheet for Healthcare Providers: SeriousBroker.it  This test is not yet approved or cleared by the [place] FDA and has been authorized for detection and/or diagnosis of SARS-CoV-2 by FDA under an Emergency Use Authorization (EUA). This EUA will remain in effect (meaning this test can be used) for the duration of the COVID-19 declaration under Section 564(b)(1) of the Act, 21 U.S.C. section 360bbb-3(b)(1), unless the authorization is terminated or revoked.  Performed at [facility], [address] [place], [place] [address]   Admission on 07/22/2022, Discharged on 07/23/2022  Component Date Value Ref Range Status   SARS Coronavirus 2 by RT PCR 07/22/2022 NEGATIVE  NEGATIVE Final   Comment: (NOTE) SARS-CoV-2 target nucleic acids are NOT DETECTED.  The SARS-CoV-2 RNA is generally detectable in upper respiratory specimens during the acute phase of infection. The lowest concentration of SARS-CoV-2 viral copies this assay can detect is 138 copies/mL. A negative result does not preclude SARS-Cov-2 infection and should not be used as the sole basis for treatment or other patient management decisions. A negative result may occur with  improper specimen collection/handling, submission of specimen other than nasopharyngeal swab, presence of viral mutation(s) within the areas targeted by this assay, and inadequate number of viral copies(<138 copies/mL). A negative result must be combined with clinical  observations, patient history, and epidemiological information. The expected result is Negative.  Fact Sheet for Patients:  BloggerCourse.com  Fact Sheet for Healthcare Providers:  SeriousBroker.it  This test is no                          t yet approved or cleared by the [place] FDA and  has been authorized for detection and/or diagnosis of SARS-CoV-2 by FDA under an Emergency Use Authorization (EUA). This EUA will remain  in effect (meaning this test can be used) for the duration of the COVID-19 declaration under Section 564(b)(1) of the Act, 21 U.S.C.section 360bbb-3(b)(1), unless the authorization is terminated  or revoked sooner.       Influenza A by PCR 07/22/2022 NEGATIVE  NEGATIVE Final   Influenza B by PCR 07/22/2022 NEGATIVE  NEGATIVE Final   Comment: (NOTE) The Xpert Xpress SARS-CoV-2/FLU/RSV plus assay is intended as an aid in the diagnosis of influenza from Nasopharyngeal swab specimens and should not be used as a sole basis for treatment. Nasal washings and aspirates are unacceptable for Xpert Xpress SARS-CoV-2/FLU/RSV testing.  Fact Sheet for Patients: BloggerCourse.com  Fact Sheet for Healthcare Providers: SeriousBroker.it  This test is not yet approved or cleared by the [place] FDA and has been  authorized for detection and/or diagnosis of SARS-CoV-2 by FDA under an Emergency Use Authorization (EUA). This EUA will remain in effect (meaning this test can be used) for the duration of the COVID-19 declaration under Section 564(b)(1) of the Act, 21 U.S.C. section 360bbb-3(b)(1), unless the authorization is terminated or revoked.     Resp Syncytial Virus by PCR 07/22/2022 NEGATIVE  NEGATIVE Final   Comment: (NOTE) Fact Sheet for Patients: BloggerCourse.com  Fact Sheet for Healthcare  Providers: SeriousBroker.it  This test is not yet approved or cleared by the [place] FDA and has been authorized for detection and/or diagnosis of SARS-CoV-2 by FDA under an Emergency Use Authorization (EUA). This EUA will remain in effect (meaning this test can be used) for the duration of the COVID-19 declaration under Section 564(b)(1) of the Act, 21 U.S.C. section 360bbb-3(b)(1), unless the authorization is terminated or revoked.  Performed at [facility], [address], [place], [place] [address]    Sodium 07/22/2022 136  135 - 145 mmol/L Final   Potassium 07/22/2022 4.5  3.5 - 5.1 mmol/L Final   Chloride 07/22/2022 106  98 - 111 mmol/L Final   CO2 07/22/2022 22  22 - 32 mmol/L Final   Glucose, Bld 07/22/2022 87  70 - 99 mg/dL Final   Glucose reference range applies only to samples taken after fasting for at least 8 hours.   BUN 07/22/2022 18  6 - 20 mg/dL Final   Creatinine, Ser 07/22/2022 0.61  0.44 - 1.00 mg/dL Final   Calcium 60/45/4098 9.3  8.9 - 10.3 mg/dL Final   Total Protein 11/91/4782 7.1  6.5 - 8.1 g/dL Final   Albumin 95/62/1308 4.0  3.5 - 5.0 g/dL Final   AST 65/78/4696 21  15 - 41 U/L Final   ALT 07/22/2022 18  0 - 44 U/L Final   Alkaline Phosphatase 07/22/2022 67  38 - 126 U/L Final   Total Bilirubin 07/22/2022 0.1 (L)  0.3 - 1.2 mg/dL Final   GFR, Estimated 07/22/2022 >60  >60 mL/min Final   Comment: (NOTE) Calculated using the CKD-EPI Creatinine Equation (2021)    Anion gap 07/22/2022 8  5 - 15 Final   Performed at [facility], [address], [place], [place] [address]   WBC 07/22/2022 7.2  4.0 - 10.5 K/uL Final   RBC 07/22/2022 5.27 (H)  3.87 - 5.11 MIL/uL Final   Hemoglobin 07/22/2022 15.8 (H)  12.0 - 15.0 g/dL Final   HCT 41/32/4401 49.0 (H)  36.0 - 46.0 % Final   MCV 07/22/2022 93.0  80.0 - 100.0 fL Final   MCH 07/22/2022 30.0  26.0 - 34.0 pg Final   MCHC 07/22/2022 32.2  30.0 - 36.0 g/dL Final   RDW  02/72/5366 13.7  11.5 - 15.5 % Final   Platelets 07/22/2022 276  150 - 400 K/uL Final   nRBC 07/22/2022 0.0  0.0 - 0.2 % Final   Performed at [facility], [address] [place], [place] [address]   I-stat hCG, quantitative 07/22/2022 <5.0  <5 mIU/mL Final   Comment 3 07/22/2022          Final   Comment:   GEST. AGE      CONC.  (mIU/mL)   <=1 WEEK        5 - 50     2 WEEKS       50 - 500     3 WEEKS  100 - 10,000     4 WEEKS     1,000 - 30,000        FEMALE AND NON-PREGNANT FEMALE:     LESS THAN 5 mIU/mL   Admission on 07/17/2022, Discharged on 07/22/2022  Component Date Value Ref Range Status   SARS Coronavirus 2 by RT PCR 07/17/2022 NEGATIVE  NEGATIVE Final   Comment: (NOTE) SARS-CoV-2 target nucleic acids are NOT DETECTED.  The SARS-CoV-2 RNA is generally detectable in upper respiratory specimens during the acute phase of infection. The lowest concentration of SARS-CoV-2 viral copies this assay can detect is 138 copies/mL. A negative result does not preclude SARS-Cov-2 infection and should not be used as the sole basis for treatment or other patient management decisions. A negative result may occur with  improper specimen collection/handling, submission of specimen other than nasopharyngeal swab, presence of viral mutation(s) within the areas targeted by this assay, and inadequate number of viral copies(<138 copies/mL). A negative result must be combined with clinical observations, patient history, and epidemiological information. The expected result is Negative.  Fact Sheet for Patients:  BloggerCourse.comhttps://www.fda.gov/media/152166/download  Fact Sheet for Healthcare Providers:  SeriousBroker.ithttps://www.fda.gov/media/152162/download  This test is no                          t yet approved or cleared by the [place] FDA and  has been authorized for detection and/or diagnosis of SARS-CoV-2 by FDA under an Emergency Use Authorization (EUA). This EUA will remain  in effect  (meaning this test can be used) for the duration of the COVID-19 declaration under Section 564(b)(1) of the Act, 21 U.S.C.section 360bbb-3(b)(1), unless the authorization is terminated  or revoked sooner.       Influenza A by PCR 07/17/2022 NEGATIVE  NEGATIVE Final   Influenza B by PCR 07/17/2022 NEGATIVE  NEGATIVE Final   Comment: (NOTE) The Xpert Xpress SARS-CoV-2/FLU/RSV plus assay is intended as an aid in the diagnosis of influenza from Nasopharyngeal swab specimens and should not be used as a sole basis for treatment. Nasal washings and aspirates are unacceptable for Xpert Xpress SARS-CoV-2/FLU/RSV testing.  Fact Sheet for Patients: BloggerCourse.comhttps://www.fda.gov/media/152166/download  Fact Sheet for Healthcare Providers: SeriousBroker.ithttps://www.fda.gov/media/152162/download  This test is not yet approved or cleared by the [place] FDA and has been authorized for detection and/or diagnosis of SARS-CoV-2 by FDA under an Emergency Use Authorization (EUA). This EUA will remain in effect (meaning this test can be used) for the duration of the COVID-19 declaration under Section 564(b)(1) of the Act, 21 U.S.C. section 360bbb-3(b)(1), unless the authorization is terminated or revoked.     Resp Syncytial Virus by PCR 07/17/2022 NEGATIVE  NEGATIVE Final   Comment: (NOTE) Fact Sheet for Patients: BloggerCourse.comhttps://www.fda.gov/media/152166/download  Fact Sheet for Healthcare Providers: SeriousBroker.ithttps://www.fda.gov/media/152162/download  This test is not yet approved or cleared by the [place] FDA and has been authorized for detection and/or diagnosis of SARS-CoV-2 by FDA under an Emergency Use Authorization (EUA). This EUA will remain in effect (meaning this test can be used) for the duration of the COVID-19 declaration under Section 564(b)(1) of the Act, 21 U.S.C. section 360bbb-3(b)(1), unless the authorization is terminated or revoked.  Performed at [facility], [address] [place],  [place] [id number]    WBC 07/17/2022 8.9  4.0 - 10.5 K/uL Final   RBC 07/17/2022 4.64  3.87 - 5.11 MIL/uL Final   Hemoglobin 07/17/2022 14.2  12.0 - 15.0 g/dL Final   HCT 19/14/782912/14/2023  41.6  36.0 - 46.0 % Final   MCV 07/17/2022 89.7  80.0 - 100.0 fL Final   MCH 07/17/2022 30.6  26.0 - 34.0 pg Final   MCHC 07/17/2022 34.1  30.0 - 36.0 g/dL Final   RDW 40/98/1191 13.7  11.5 - 15.5 % Final   Platelets 07/17/2022 306  150 - 400 K/uL Final   nRBC 07/17/2022 0.0  0.0 - 0.2 % Final   Neutrophils Relative % 07/17/2022 59  % Final   Neutro Abs 07/17/2022 5.2  1.7 - 7.7 K/uL Final   Lymphocytes Relative 07/17/2022 35  % Final   Lymphs Abs 07/17/2022 3.1  0.7 - 4.0 K/uL Final   Monocytes Relative 07/17/2022 5  % Final   Monocytes Absolute 07/17/2022 0.5  0.1 - 1.0 K/uL Final   Eosinophils Relative 07/17/2022 1  % Final   Eosinophils Absolute 07/17/2022 0.1  0.0 - 0.5 K/uL Final   Basophils Relative 07/17/2022 0  % Final   Basophils Absolute 07/17/2022 0.0  0.0 - 0.1 K/uL Final   Immature Granulocytes 07/17/2022 0  % Final   Abs Immature Granulocytes 07/17/2022 0.04  0.00 - 0.07 K/uL Final   Performed at [facility], [address] [place], [place] [address]   Sodium 07/17/2022 139  135 - 145 mmol/L Final   Potassium 07/17/2022 3.7  3.5 - 5.1 mmol/L Final   Chloride 07/17/2022 104  98 - 111 mmol/L Final   CO2 07/17/2022 24  22 - 32 mmol/L Final   Glucose, Bld 07/17/2022 51 (L)  70 - 99 mg/dL Final   Glucose reference range applies only to samples taken after fasting for at least 8 hours.   BUN 07/17/2022 8  6 - 20 mg/dL Final   Creatinine, Ser 07/17/2022 0.69  0.44 - 1.00 mg/dL Final   Calcium 56/21/3086 8.8 (L)  8.9 - 10.3 mg/dL Final   Total Protein 57/84/6962 6.2 (L)  6.5 - 8.1 g/dL Final   Albumin 95/28/4132 3.6  3.5 - 5.0 g/dL Final   AST 44/08/270 23  15 - 41 U/L Final   ALT 07/17/2022 19  0 - 44 U/L Final   Alkaline Phosphatase 07/17/2022 82  38 - 126 U/L Final   Total Bilirubin  07/17/2022 0.6  0.3 - 1.2 mg/dL Final   GFR, Estimated 07/17/2022 >60  >60 mL/min Final   Comment: (NOTE) Calculated using the CKD-EPI Creatinine Equation (2021)    Anion gap 07/17/2022 11  5 - 15 Final   Performed at [facility], [address] [place], [place] [address]   Alcohol, Ethyl (B) 07/17/2022 37 (H)  <10 mg/dL Final   Comment: (NOTE) Lowest detectable limit for serum alcohol is 10 mg/dL.  For medical purposes only. Performed at [facility], [address], [place], [place] [address]    Neisseria Gonorrhea 07/17/2022 Negative   Final   Chlamydia 07/17/2022 Positive (A)   Final   Comment 07/17/2022 Normal Reference Ranger Chlamydia - Negative   Final   Comment 07/17/2022 Normal Reference Range Neisseria Gonorrhea - Negative   Final   POC Amphetamine UR 07/17/2022 None Detected  NONE DETECTED (Cut Off Level 1000 ng/mL) Final   POC Secobarbital (BAR) 07/17/2022 None Detected  NONE DETECTED (Cut Off Level 300 ng/mL) Final   POC Buprenorphine (BUP) 07/17/2022 None Detected  NONE DETECTED (Cut Off Level 10 ng/mL) Final   POC Oxazepam (BZO) 07/17/2022 Positive (A)  NONE DETECTED (Cut Off Level 300 ng/mL)  Final   POC Cocaine UR 07/17/2022 None Detected  NONE DETECTED (Cut Off Level 300 ng/mL) Final   POC Methamphetamine UR 07/17/2022 None Detected  NONE DETECTED (Cut Off Level 1000 ng/mL) Final   POC Morphine 07/17/2022 None Detected  NONE DETECTED (Cut Off Level 300 ng/mL) Final   POC Methadone UR 07/17/2022 None Detected  NONE DETECTED (Cut Off Level 300 ng/mL) Final   POC Oxycodone UR 07/17/2022 None Detected  NONE DETECTED (Cut Off Level 100 ng/mL) Final   POC Marijuana UR 07/17/2022 None Detected  NONE DETECTED (Cut Off Level 50 ng/mL) Final   Preg Test, Ur 07/17/2022 Negative  Negative Final   RPR Ser Ql 07/17/2022 NON REACTIVE  NON REACTIVE Final   Performed at [facility], [address] [place], [place] [address]   HIV Screen 4th Generation wRfx  07/17/2022 Non Reactive  Non Reactive Final   Performed at [facility], [address] [place], [place] [address]   SARSCOV2ONAVIRUS 2 AG 07/17/2022 NEGATIVE  NEGATIVE Final   Comment: (NOTE) SARS-CoV-2 antigen NOT DETECTED.   Negative results are presumptive.  Negative results do not preclude SARS-CoV-2 infection and should not be used as the sole basis for treatment or other patient management decisions, including infection  control decisions, particularly in the presence of clinical signs and  symptoms consistent with COVID-19, or in those who have been in contact with the virus.  Negative results must be combined with clinical observations, patient history, and epidemiological information. The expected result is Negative.  Fact Sheet for Patients: https://www.jennings-kim.com/https://www.fda.gov/media/141569/download  Fact Sheet for Healthcare Providers: https://alexander-rogers.biz/https://www.fda.gov/media/141568/download  This test is not yet approved or cleared by the [place] FDA and  has been authorized for detection and/or diagnosis of SARS-CoV-2 by FDA under an Emergency Use Authorization (EUA).  This EUA will remain in effect (meaning this test can be used) for the duration of  the COV                          ID-19 declaration under Section 564(b)(1) of the Act, 21 U.S.C. section 360bbb-3(b)(1), unless the authorization is terminated or revoked sooner.     Preg Test, Ur 07/17/2022 NEGATIVE  NEGATIVE Final   Comment:        THE SENSITIVITY OF THIS METHODOLOGY IS >24 mIU/mL   Admission on 07/14/2022, Discharged on 07/14/2022  Component Date Value Ref Range Status   SARS Coronavirus 2 by RT PCR 07/14/2022 NEGATIVE  NEGATIVE Final   Comment: (NOTE) SARS-CoV-2 target nucleic acids are NOT DETECTED.  The SARS-CoV-2 RNA is generally detectable in upper respiratory specimens during the acute phase of infection. The lowest concentration of SARS-CoV-2 viral copies this assay can detect is 138 copies/mL. A  negative result does not preclude SARS-Cov-2 infection and should not be used as the sole basis for treatment or other patient management decisions. A negative result may occur with  improper specimen collection/handling, submission of specimen other than nasopharyngeal swab, presence of viral mutation(s) within the areas targeted by this assay, and inadequate number of viral copies(<138 copies/mL). A negative result must be combined with clinical observations, patient history, and epidemiological information. The expected result is Negative.  Fact Sheet for Patients:  BloggerCourse.comhttps://www.fda.gov/media/152166/download  Fact Sheet for Healthcare Providers:  SeriousBroker.ithttps://www.fda.gov/media/152162/download  This test is no  t yet approved or cleared by the [place] and  has been authorized for detection and/or diagnosis of SARS-CoV-2 by FDA under an Emergency Use Authorization (EUA). This EUA will remain  in effect (meaning this test can be used) for the duration of the COVID-19 declaration under Section 564(b)(1) of the Act, 21 U.S.C.section 360bbb-3(b)(1), unless the authorization is terminated  or revoked sooner.       Influenza A by PCR 07/14/2022 NEGATIVE  NEGATIVE Final   Influenza B by PCR 07/14/2022 NEGATIVE  NEGATIVE Final   Comment: (NOTE) The Xpert Xpress SARS-CoV-2/FLU/RSV plus assay is intended as an aid in the diagnosis of influenza from Nasopharyngeal swab specimens and should not be used as a sole basis for treatment. Nasal washings and aspirates are unacceptable for Xpert Xpress SARS-CoV-2/FLU/RSV testing.  Fact Sheet for Patients: BloggerCourse.com  Fact Sheet for Healthcare Providers: SeriousBroker.it  This test is not yet approved or cleared by the [place] FDA and has been authorized for detection and/or diagnosis of SARS-CoV-2 by FDA under an Emergency Use Authorization (EUA). This  EUA will remain in effect (meaning this test can be used) for the duration of the COVID-19 declaration under Section 564(b)(1) of the Act, 21 U.S.C. section 360bbb-3(b)(1), unless the authorization is terminated or revoked.  Performed at [facility], [address] [place], [place] [address]   Admission on 07/11/2022, Discharged on 07/11/2022  Component Date Value Ref Range Status   WBC 07/11/2022 9.7  4.0 - 10.5 K/uL Final   RBC 07/11/2022 5.26 (H)  3.87 - 5.11 MIL/uL Final   Hemoglobin 07/11/2022 15.9 (H)  12.0 - 15.0 g/dL Final   HCT 60/45/4098 48.3 (H)  36.0 - 46.0 % Final   MCV 07/11/2022 91.8  80.0 - 100.0 fL Final   MCH 07/11/2022 30.2  26.0 - 34.0 pg Final   MCHC 07/11/2022 32.9  30.0 - 36.0 g/dL Final   RDW 11/91/4782 13.5  11.5 - 15.5 % Final   Platelets 07/11/2022 282  150 - 400 K/uL Final   nRBC 07/11/2022 0.0  0.0 - 0.2 % Final   Neutrophils Relative % 07/11/2022 63  % Final   Neutro Abs 07/11/2022 6.1  1.7 - 7.7 K/uL Final   Lymphocytes Relative 07/11/2022 29  % Final   Lymphs Abs 07/11/2022 2.8  0.7 - 4.0 K/uL Final   Monocytes Relative 07/11/2022 7  % Final   Monocytes Absolute 07/11/2022 0.7  0.1 - 1.0 K/uL Final   Eosinophils Relative 07/11/2022 1  % Final   Eosinophils Absolute 07/11/2022 0.1  0.0 - 0.5 K/uL Final   Basophils Relative 07/11/2022 0  % Final   Basophils Absolute 07/11/2022 0.0  0.0 - 0.1 K/uL Final   Immature Granulocytes 07/11/2022 0  % Final   Abs Immature Granulocytes 07/11/2022 0.03  0.00 - 0.07 K/uL Final   Performed at [facility], [address], [place], [place] [address]   Sodium 07/11/2022 139  135 - 145 mmol/L Final   Potassium 07/11/2022 3.8  3.5 - 5.1 mmol/L Final   Chloride 07/11/2022 105  98 - 111 mmol/L Final   CO2 07/11/2022 24  22 - 32 mmol/L Final   Glucose, Bld 07/11/2022 90  70 - 99 mg/dL Final   Glucose reference range applies only to samples taken after fasting for at least 8 hours.   BUN  07/11/2022 9  6 - 20 mg/dL Final   Creatinine, Ser 07/11/2022 0.57  0.44 -  1.00 mg/dL Final   Calcium 86/57/8469 8.5 (L)  8.9 - 10.3 mg/dL Final   Total Protein 62/95/2841 6.3 (L)  6.5 - 8.1 g/dL Final   Albumin 32/44/0102 3.6  3.5 - 5.0 g/dL Final   AST 72/53/6644 19  15 - 41 U/L Final   ALT 07/11/2022 16  0 - 44 U/L Final   Alkaline Phosphatase 07/11/2022 78  38 - 126 U/L Final   Total Bilirubin 07/11/2022 0.4  0.3 - 1.2 mg/dL Final   GFR, Estimated 07/11/2022 >60  >60 mL/min Final   Comment: (NOTE) Calculated using the CKD-EPI Creatinine Equation (2021)    Anion gap 07/11/2022 10  5 - 15 Final   Performed at [facility], [address], [place], [place] [address]   Troponin I (High Sensitivity) 07/11/2022 <2  <18 ng/L Final   Comment: (NOTE) Elevated high sensitivity troponin I (hsTnI) values and significant  changes across serial measurements may suggest ACS but many other  chronic and acute conditions are known to elevate hsTnI results.  Refer to the "Links" section for chest pain algorithms and additional  guidance. Performed at [facility], [address] [place], [place] [address]    Lipase 07/11/2022 35  11 - 51 U/L Final   Performed at [facility], [address], [place], [place] [address]   D-Dimer, Quant 07/11/2022 <0.27  0.00 - 0.50 ug/mL-FEU Final   Comment: (NOTE) At the manufacturer cut-off value of 0.5 g/mL FEU, this assay has a negative predictive value of 95-100%.This assay is intended for use in conjunction with a clinical pretest probability (PTP) assessment model to exclude pulmonary embolism (PE) and deep venous thrombosis (DVT) in outpatients suspected of PE or DVT. Results should be correlated with clinical presentation. Performed at [facility], [address] [place], [place] [address]    Color, Urine 07/11/2022 YELLOW  YELLOW Final   APPearance 07/11/2022 HAZY (A)  CLEAR Final   Specific Gravity, Urine 07/11/2022  1.024  1.005 - 1.030 Final   pH 07/11/2022 6.0  5.0 - 8.0 Final   Glucose, UA 07/11/2022 NEGATIVE  NEGATIVE mg/dL Final   Hgb urine dipstick 07/11/2022 NEGATIVE  NEGATIVE Final   Bilirubin Urine 07/11/2022 NEGATIVE  NEGATIVE Final   Ketones, ur 07/11/2022 NEGATIVE  NEGATIVE mg/dL Final   Protein, ur 51/88/4166 NEGATIVE  NEGATIVE mg/dL Final   Nitrite 02/01/1600 NEGATIVE  NEGATIVE Final   Leukocytes,Ua 07/11/2022 NEGATIVE  NEGATIVE Final   Performed at [facility], [address], [place], [place] [address]   SARS Coronavirus 2 by RT PCR 07/11/2022 NEGATIVE  NEGATIVE Final   Comment: (NOTE) SARS-CoV-2 target nucleic acids are NOT DETECTED.  The SARS-CoV-2 RNA is generally detectable in upper respiratory specimens during the acute phase of infection. The lowest concentration of SARS-CoV-2 viral copies this assay can detect is 138 copies/mL. A negative result does not preclude SARS-Cov-2 infection and should not be used as the sole basis for treatment or other patient management decisions. A negative result may occur with  improper specimen collection/handling, submission of specimen other than nasopharyngeal swab, presence of viral mutation(s) within the areas targeted by this assay, and inadequate number of viral copies(<138 copies/mL). A negative result must be combined with clinical observations, patient history, and epidemiological information. The expected result is Negative.  Fact Sheet for Patients:  BloggerCourse.com  Fact Sheet for Healthcare Providers:  SeriousBroker.it  This test is no  t yet approved or cleared by the [place] FDA and  has been authorized for detection and/or diagnosis of SARS-CoV-2 by FDA under an Emergency Use Authorization (EUA). This EUA will remain  in effect (meaning this test can be used) for the duration of the COVID-19 declaration under Section 564(b)(1) of  the Act, 21 U.S.C.section 360bbb-3(b)(1), unless the authorization is terminated  or revoked sooner.       Influenza A by PCR 07/11/2022 NEGATIVE  NEGATIVE Final   Influenza B by PCR 07/11/2022 NEGATIVE  NEGATIVE Final   Comment: (NOTE) The Xpert Xpress SARS-CoV-2/FLU/RSV plus assay is intended as an aid in the diagnosis of influenza from Nasopharyngeal swab specimens and should not be used as a sole basis for treatment. Nasal washings and aspirates are unacceptable for Xpert Xpress SARS-CoV-2/FLU/RSV testing.  Fact Sheet for Patients: BloggerCourse.comhttps://www.fda.gov/media/152166/download  Fact Sheet for Healthcare Providers: SeriousBroker.ithttps://www.fda.gov/media/152162/download  This test is not yet approved or cleared by the [place] FDA and has been authorized for detection and/or diagnosis of SARS-CoV-2 by FDA under an Emergency Use Authorization (EUA). This EUA will remain in effect (meaning this test can be used) for the duration of the COVID-19 declaration under Section 564(b)(1) of the Act, 21 U.S.C. section 360bbb-3(b)(1), unless the authorization is terminated or revoked.  Performed at [facility], [address] [place], [place] [address]    I-stat hCG, quantitative 07/11/2022 <5.0  <5 mIU/mL Final   Comment 3 07/11/2022          Final   Comment:   GEST. AGE      CONC.  (mIU/mL)   <=1 WEEK        5 - 50     2 WEEKS       50 - 500     3 WEEKS       100 - 10,000     4 WEEKS     1,000 - 30,000        FEMALE AND NON-PREGNANT FEMALE:     LESS THAN 5 mIU/mL    Troponin I (High Sensitivity) 07/11/2022 2  <18 ng/L Final   Comment: (NOTE) Elevated high sensitivity troponin I (hsTnI) values and significant  changes across serial measurements may suggest ACS but many other  chronic and acute conditions are known to elevate hsTnI results.  Refer to the "Links" section for chest pain algorithms and additional  guidance. Performed at [facility], [address]  [place], [place] [address]   Admission on 07/02/2022, Discharged on 07/02/2022  Component Date Value Ref Range Status   Color, Urine 07/02/2022 YELLOW  YELLOW Final   APPearance 07/02/2022 HAZY (A)  CLEAR Final   Specific Gravity, Urine 07/02/2022 1.014  1.005 - 1.030 Final   pH 07/02/2022 5.0  5.0 - 8.0 Final   Glucose, UA 07/02/2022 NEGATIVE  NEGATIVE mg/dL Final   Hgb urine dipstick 07/02/2022 SMALL (A)  NEGATIVE Final   Bilirubin Urine 07/02/2022 NEGATIVE  NEGATIVE Final   Ketones, ur 07/02/2022 NEGATIVE  NEGATIVE mg/dL Final   Protein, ur 09/81/191411/29/2023 NEGATIVE  NEGATIVE mg/dL Final   Nitrite 78/29/562111/29/2023 POSITIVE (A)  NEGATIVE Final   Leukocytes,Ua 07/02/2022 LARGE (A)  NEGATIVE Final   RBC / HPF 07/02/2022 6-10  0 - 5 RBC/hpf Final   WBC, UA 07/02/2022 >50 (H)  0 - 5 WBC/hpf Final   Bacteria, UA 07/02/2022 FEW (A)  NONE SEEN Final   Squamous Epithelial / HPF 07/02/2022 6-10  0 -  5 Final   Mucus 07/02/2022 PRESENT   Final   Non Squamous Epithelial 07/02/2022 0-5 (A)  NONE SEEN Final   Performed at [facility], [address] [place], [place] [address]   WBC 07/02/2022 9.9  4.0 - 10.5 K/uL Final   RBC 07/02/2022 5.18 (H)  3.87 - 5.11 MIL/uL Final   Hemoglobin 07/02/2022 15.8 (H)  12.0 - 15.0 g/dL Final   HCT 29/93/7169 48.5 (H)  36.0 - 46.0 % Final   MCV 07/02/2022 93.6  80.0 - 100.0 fL Final   MCH 07/02/2022 30.5  26.0 - 34.0 pg Final   MCHC 07/02/2022 32.6  30.0 - 36.0 g/dL Final   RDW 67/89/3810 13.5  11.5 - 15.5 % Final   Platelets 07/02/2022 249  150 - 400 K/uL Final   nRBC 07/02/2022 0.0  0.0 - 0.2 % Final   Performed at [facility] [address], [place], [place] [address]   Sodium 07/02/2022 138  135 - 145 mmol/L Final   Potassium 07/02/2022 3.9  3.5 - 5.1 mmol/L Final   Chloride 07/02/2022 105  98 - 111 mmol/L Final   CO2 07/02/2022 26  22 - 32 mmol/L Final   Glucose, Bld 07/02/2022 114 (H)  70 - 99 mg/dL Final   Glucose reference  range applies only to samples taken after fasting for at least 8 hours.   BUN 07/02/2022 9  6 - 20 mg/dL Final   Creatinine, Ser 07/02/2022 0.64  0.44 - 1.00 mg/dL Final   Calcium 25/85/2778 8.9  8.9 - 10.3 mg/dL Final   GFR, Estimated 07/02/2022 >60  >60 mL/min Final   Comment: (NOTE) Calculated using the CKD-EPI Creatinine Equation (2021)    Anion gap 07/02/2022 7  5 - 15 Final   Performed at [facility], [address] [place], [place] [address]   Lipase 07/02/2022 35  11 - 51 U/L Final   Performed at [facility], [address], [place], [place] [address]   Total Protein 07/02/2022 6.9  6.5 - 8.1 g/dL Final   Albumin 31/54/0086 3.8  3.5 - 5.0 g/dL Final   AST 76/19/5093 17  15 - 41 U/L Final   ALT 07/02/2022 16  0 - 44 U/L Final   Alkaline Phosphatase 07/02/2022 91  38 - 126 U/L Final   Total Bilirubin 07/02/2022 0.9  0.3 - 1.2 mg/dL Final   Bilirubin, Direct 07/02/2022 0.1  0.0 - 0.2 mg/dL Final   Indirect Bilirubin 07/02/2022 0.8  0.3 - 0.9 mg/dL Final   Performed at [facility], [address] [place], [place] [address]  Admission on 06/06/2022, Discharged on 06/06/2022  Component Date Value Ref Range Status   Sodium 06/06/2022 140  135 - 145 mmol/L Final   Potassium 06/06/2022 3.2 (L)  3.5 - 5.1 mmol/L Final   Chloride 06/06/2022 103  98 - 111 mmol/L Final   CO2 06/06/2022 26  22 - 32 mmol/L Final   Glucose, Bld 06/06/2022 97  70 - 99 mg/dL Final   Glucose reference range applies only to samples taken after fasting for at least 8 hours.   BUN 06/06/2022 6  6 - 20 mg/dL Final   Creatinine, Ser 06/06/2022 0.54  0.44 - 1.00 mg/dL Final   Calcium 45/80/9983 9.0  8.9 - 10.3 mg/dL Final   Total Protein 38/25/0539 6.5  6.5 - 8.1 g/dL Final   Albumin 76/73/4193 3.6  3.5 - 5.0 g/dL  Final   AST 06/06/2022 20  15 - 41 U/L Final   ALT 06/06/2022 17  0 - 44 U/L Final   Alkaline Phosphatase 06/06/2022 111  38 - 126 U/L Final    Total Bilirubin 06/06/2022 0.3  0.3 - 1.2 mg/dL Final   GFR, Estimated 06/06/2022 >60  >60 mL/min Final   Comment: (NOTE) Calculated using the CKD-EPI Creatinine Equation (2021)    Anion gap 06/06/2022 11  5 - 15 Final   Performed at [facility], [place] [address] [place], [place] [address]   Alcohol, Ethyl (B) 06/06/2022 18 (H)  <10 mg/dL Final   Comment: (NOTE) Lowest detectable limit for serum alcohol is 10 mg/dL.  For medical purposes only. Performed at [facility], [place] [address], [place], [place] [address]    WBC 06/06/2022 6.0  4.0 - 10.5 K/uL Final   RBC 06/06/2022 4.76  3.87 - 5.11 MIL/uL Final   Hemoglobin 06/06/2022 14.5  12.0 - 15.0 g/dL Final   HCT 06/06/2022 43.9  36.0 - 46.0 % Final   MCV 06/06/2022 92.2  80.0 - 100.0 fL Final   MCH 06/06/2022 30.5  26.0 - 34.0 pg Final   MCHC 06/06/2022 33.0  30.0 - 36.0 g/dL Final   RDW 06/06/2022 13.5  11.5 - 15.5 % Final   Platelets 06/06/2022 224  150 - 400 K/uL Final   nRBC 06/06/2022 0.0  0.0 - 0.2 % Final   Performed at [facility], [place] [address], [place], [place] [address]   I-stat hCG, quantitative 06/06/2022 <5.0  <5 mIU/mL Final   Comment 3 06/06/2022          Final   Comment:   GEST. AGE      CONC.  (mIU/mL)   <=1 WEEK        5 - 50     2 WEEKS       50 - 500     3 WEEKS       100 - 10,000     4 WEEKS     1,000 - 30,000        FEMALE AND NON-PREGNANT FEMALE:     LESS THAN 5 mIU/mL   Admission on 06/05/2022, Discharged on 06/05/2022  Component Date Value Ref Range Status   Sodium 06/05/2022 139  135 - 145 mmol/L Final   Potassium 06/05/2022 3.7  3.5 - 5.1 mmol/L Final   Chloride 06/05/2022 105  98 - 111 mmol/L Final   CO2 06/05/2022 26  22 - 32 mmol/L Final   Glucose, Bld 06/05/2022 114 (H)  70 - 99 mg/dL Final   Glucose reference range applies only to samples taken after fasting for at least 8 hours.   BUN 06/05/2022 8  6 - 20 mg/dL Final   Creatinine, Ser 06/05/2022 0.50  0.44 - 1.00  mg/dL Final   Calcium 06/05/2022 8.8 (L)  8.9 - 10.3 mg/dL Final   Total Protein 06/05/2022 7.1  6.5 - 8.1 g/dL Final   Albumin 06/05/2022 3.9  3.5 - 5.0 g/dL Final   AST 06/05/2022 20  15 - 41 U/L Final   ALT 06/05/2022 17  0 - 44 U/L Final   Alkaline Phosphatase 06/05/2022 120  38 - 126 U/L Final   Total Bilirubin 06/05/2022 0.5  0.3 - 1.2 mg/dL Final   GFR, Estimated 06/05/2022 >60  >60 mL/min Final   Comment: (NOTE) Calculated using the CKD-EPI Creatinine Equation (2021)  Anion gap 06/05/2022 8  5 - 15 Final   Performed at [facility], [place] [address] [place], [place] [address]   WBC 06/05/2022 4.9  4.0 - 10.5 K/uL Final   RBC 06/05/2022 5.00  3.87 - 5.11 MIL/uL Final   Hemoglobin 06/05/2022 15.1 (H)  12.0 - 15.0 g/dL Final   HCT 06/05/2022 46.0  36.0 - 46.0 % Final   MCV 06/05/2022 92.0  80.0 - 100.0 fL Final   MCH 06/05/2022 30.2  26.0 - 34.0 pg Final   MCHC 06/05/2022 32.8  30.0 - 36.0 g/dL Final   RDW 06/05/2022 13.6  11.5 - 15.5 % Final   Platelets 06/05/2022 248  150 - 400 K/uL Final   nRBC 06/05/2022 0.0  0.0 - 0.2 % Final   Performed at [facility], [place] [address] [place], [place] [address]   Opiates 06/05/2022 NONE DETECTED  NONE DETECTED Final   Cocaine 06/05/2022 NONE DETECTED  NONE DETECTED Final   Benzodiazepines 06/05/2022 NONE DETECTED  NONE DETECTED Final   Amphetamines 06/05/2022 POSITIVE (A)  NONE DETECTED Final   Tetrahydrocannabinol 06/05/2022 NONE DETECTED  NONE DETECTED Final   Barbiturates 06/05/2022 NONE DETECTED  NONE DETECTED Final   Comment: (NOTE) DRUG SCREEN FOR MEDICAL PURPOSES ONLY.  IF CONFIRMATION IS NEEDED FOR ANY PURPOSE, NOTIFY LAB WITHIN 5 DAYS.  LOWEST DETECTABLE LIMITS FOR URINE DRUG SCREEN Drug Class                     Cutoff (ng/mL) Amphetamine and metabolites    1000 Barbiturate and metabolites    200 Benzodiazepine                 200 Opiates and metabolites        300 Cocaine and  metabolites        300 THC                            50 Performed at [facility], [place] [address], [place], [place] [address]    Alcohol, Ethyl (B) 06/05/2022 <10  <10 mg/dL Final   Comment: (NOTE) Lowest detectable limit for serum alcohol is 10 mg/dL.  For medical purposes only. Performed at [facility], [place] [address], [place], [place] [address]   Admission on 05/10/2022, Discharged on 05/10/2022  Component Date Value Ref Range Status   Opiates 05/10/2022 NONE DETECTED  NONE DETECTED Final   Cocaine 05/10/2022 POSITIVE (A)  NONE DETECTED Final   Benzodiazepines 05/10/2022 NONE DETECTED  NONE DETECTED Final   Amphetamines 05/10/2022 NONE DETECTED  NONE DETECTED Final   Tetrahydrocannabinol 05/10/2022 NONE DETECTED  NONE DETECTED Final   Barbiturates 05/10/2022 NONE DETECTED  NONE DETECTED Final   Comment: (NOTE) DRUG SCREEN FOR MEDICAL PURPOSES ONLY.  IF CONFIRMATION IS NEEDED FOR ANY PURPOSE, NOTIFY LAB WITHIN 5 DAYS.  LOWEST DETECTABLE LIMITS FOR URINE DRUG SCREEN Drug Class                     Cutoff (ng/mL) Amphetamine and metabolites    1000 Barbiturate and metabolites    200 Benzodiazepine                 127 Tricyclics and metabolites     300 Opiates and metabolites        300 Cocaine and metabolites        300 THC  50 Performed at [facility], [address] [place], [place] [address]    Alcohol, Ethyl (B) 05/10/2022 <10  <10 mg/dL Final   Comment: (NOTE) Lowest detectable limit for serum alcohol is 10 mg/dL.  For medical purposes only. Performed at [facility], [address] [place], [place] [address]    Sodium 05/10/2022 137  135 - 145 mmol/L Final   Potassium 05/10/2022 4.2  3.5 - 5.1 mmol/L Final   Chloride 05/10/2022 102  98 - 111 mmol/L Final   CO2 05/10/2022 29  22 - 32 mmol/L Final   Glucose, Bld 05/10/2022 90  70 - 99 mg/dL Final   Glucose  reference range applies only to samples taken after fasting for at least 8 hours.   BUN 05/10/2022 11  6 - 20 mg/dL Final   Creatinine, Ser 05/10/2022 0.57  0.44 - 1.00 mg/dL Final   Calcium 09/81/1914 8.7 (L)  8.9 - 10.3 mg/dL Final   Total Protein 78/29/5621 7.2  6.5 - 8.1 g/dL Final   Albumin 30/86/5784 3.7  3.5 - 5.0 g/dL Final   AST 69/62/9528 18  15 - 41 U/L Final   ALT 05/10/2022 16  0 - 44 U/L Final   Alkaline Phosphatase 05/10/2022 97  38 - 126 U/L Final   Total Bilirubin 05/10/2022 0.5  0.3 - 1.2 mg/dL Final   GFR, Estimated 05/10/2022 >60  >60 mL/min Final   Comment: (NOTE) Calculated using the CKD-EPI Creatinine Equation (2021)    Anion gap 05/10/2022 6  5 - 15 Final   Performed at [facility], [address] [place], [place] [address]   WBC 05/10/2022 6.4  4.0 - 10.5 K/uL Final   RBC 05/10/2022 5.56 (H)  3.87 - 5.11 MIL/uL Final   Hemoglobin 05/10/2022 16.3 (H)  12.0 - 15.0 g/dL Final   HCT 40/05/2724 50.7 (H)  36.0 - 46.0 % Final   MCV 05/10/2022 91.2  80.0 - 100.0 fL Final   MCH 05/10/2022 29.3  26.0 - 34.0 pg Final   MCHC 05/10/2022 32.1  30.0 - 36.0 g/dL Final   RDW 36/64/4034 13.2  11.5 - 15.5 % Final   Platelets 05/10/2022 250  150 - 400 K/uL Final   nRBC 05/10/2022 0.0  0.0 - 0.2 % Final   Neutrophils Relative % 05/10/2022 49  % Final   Neutro Abs 05/10/2022 3.2  1.7 - 7.7 K/uL Final   Lymphocytes Relative 05/10/2022 40  % Final   Lymphs Abs 05/10/2022 2.6  0.7 - 4.0 K/uL Final   Monocytes Relative 05/10/2022 8  % Final   Monocytes Absolute 05/10/2022 0.5  0.1 - 1.0 K/uL Final   Eosinophils Relative 05/10/2022 1  % Final   Eosinophils Absolute 05/10/2022 0.0  0.0 - 0.5 K/uL Final   Basophils Relative 05/10/2022 1  % Final   Basophils Absolute 05/10/2022 0.0  0.0 - 0.1 K/uL Final   Immature Granulocytes 05/10/2022 1  % Final   Abs Immature Granulocytes 05/10/2022 0.04  0.00 - 0.07 K/uL Final   Performed at [facility],  [address], [place], [place] [address]   Lipase 05/10/2022 34  11 - 51 U/L Final   Performed at [facility], [address] [place], [place] [address]  Admission on 05/08/2022, Discharged on 05/09/2022  Component Date Value Ref Range Status   SARS Coronavirus 2 by RT PCR 05/08/2022 NEGATIVE  NEGATIVE Final   Comment: (NOTE) SARS-CoV-2 target  nucleic acids are NOT DETECTED.  The SARS-CoV-2 RNA is generally detectable in upper respiratory specimens during the acute phase of infection. The lowest concentration of SARS-CoV-2 viral copies this assay can detect is 138 copies/mL. A negative result does not preclude SARS-Cov-2 infection and should not be used as the sole basis for treatment or other patient management decisions. A negative result may occur with  improper specimen collection/handling, submission of specimen other than nasopharyngeal swab, presence of viral mutation(s) within the areas targeted by this assay, and inadequate number of viral copies(<138 copies/mL). A negative result must be combined with clinical observations, patient history, and epidemiological information. The expected result is Negative.  Fact Sheet for Patients:  BloggerCourse.com  Fact Sheet for Healthcare Providers:  SeriousBroker.it  This test is no                          t yet approved or cleared by the [place] FDA and  has been authorized for detection and/or diagnosis of SARS-CoV-2 by FDA under an Emergency Use Authorization (EUA). This EUA will remain  in effect (meaning this test can be used) for the duration of the COVID-19 declaration under Section 564(b)(1) of the Act, 21 U.S.C.section 360bbb-3(b)(1), unless the authorization is terminated  or revoked sooner.       Influenza A by PCR 05/08/2022 NEGATIVE  NEGATIVE Final   Influenza B by PCR 05/08/2022 NEGATIVE  NEGATIVE Final   Comment: (NOTE) The Xpert  Xpress SARS-CoV-2/FLU/RSV plus assay is intended as an aid in the diagnosis of influenza from Nasopharyngeal swab specimens and should not be used as a sole basis for treatment. Nasal washings and aspirates are unacceptable for Xpert Xpress SARS-CoV-2/FLU/RSV testing.  Fact Sheet for Patients: BloggerCourse.com  Fact Sheet for Healthcare Providers: SeriousBroker.it  This test is not yet approved or cleared by the [place] FDA and has been authorized for detection and/or diagnosis of SARS-CoV-2 by FDA under an Emergency Use Authorization (EUA). This EUA will remain in effect (meaning this test can be used) for the duration of the COVID-19 declaration under Section 564(b)(1) of the Act, 21 U.S.C. section 360bbb-3(b)(1), unless the authorization is terminated or revoked.  Performed at [facility], [address] [place], [place] [address]    WBC 05/08/2022 6.5  4.0 - 10.5 K/uL Final   RBC 05/08/2022 5.36 (H)  3.87 - 5.11 MIL/uL Final   Hemoglobin 05/08/2022 16.1 (H)  12.0 - 15.0 g/dL Final   HCT 19/14/7829 47.9 (H)  36.0 - 46.0 % Final   MCV 05/08/2022 89.4  80.0 - 100.0 fL Final   MCH 05/08/2022 30.0  26.0 - 34.0 pg Final   MCHC 05/08/2022 33.6  30.0 - 36.0 g/dL Final   RDW 56/21/3086 13.2  11.5 - 15.5 % Final   Platelets 05/08/2022 237  150 - 400 K/uL Final   nRBC 05/08/2022 0.0  0.0 - 0.2 % Final   Neutrophils Relative % 05/08/2022 37  % Final   Neutro Abs 05/08/2022 2.4  1.7 - 7.7 K/uL Final   Lymphocytes Relative 05/08/2022 52  % Final   Lymphs Abs 05/08/2022 3.4  0.7 - 4.0 K/uL Final   Monocytes Relative 05/08/2022 8  % Final   Monocytes Absolute 05/08/2022 0.5  0.1 - 1.0 K/uL Final   Eosinophils Relative 05/08/2022 1  % Final   Eosinophils Absolute 05/08/2022 0.1  0.0 - 0.5 K/uL Final   Basophils Relative 05/08/2022 1  %  Final   Basophils Absolute 05/08/2022 0.0  0.0 - 0.1 K/uL Final   Immature Granulocytes  05/08/2022 1  % Final   Abs Immature Granulocytes 05/08/2022 0.05  0.00 - 0.07 K/uL Final   Performed at [facility], [place] [address], [place], [place] [address]   Sodium 05/08/2022 135  135 - 145 mmol/L Final   Potassium 05/08/2022 3.9  3.5 - 5.1 mmol/L Final   Chloride 05/08/2022 100  98 - 111 mmol/L Final   CO2 05/08/2022 27  22 - 32 mmol/L Final   Glucose, Bld 05/08/2022 93  70 - 99 mg/dL Final   Glucose reference range applies only to samples taken after fasting for at least 8 hours.   BUN 05/08/2022 8  6 - 20 mg/dL Final   Creatinine, Ser 05/08/2022 0.68  0.44 - 1.00 mg/dL Final   Calcium 05/08/2022 9.0  8.9 - 10.3 mg/dL Final   Total Protein 05/08/2022 6.6  6.5 - 8.1 g/dL Final   Albumin 05/08/2022 3.7  3.5 - 5.0 g/dL Final   AST 05/08/2022 17  15 - 41 U/L Final   ALT 05/08/2022 17  0 - 44 U/L Final   Alkaline Phosphatase 05/08/2022 111  38 - 126 U/L Final   Total Bilirubin 05/08/2022 0.6  0.3 - 1.2 mg/dL Final   GFR, Estimated 05/08/2022 >60  >60 mL/min Final   Comment: (NOTE) Calculated using the CKD-EPI Creatinine Equation (2021)    Anion gap 05/08/2022 8  5 - 15 Final   Performed at [place] [address], [place], [place] [address]   Hgb A1c MFr Bld 05/08/2022 4.9  4.8 - 5.6 % Final   Comment: (NOTE) Pre diabetes:          5.7%-6.4%  Diabetes:              >6.4%  Glycemic control for   <7.0% adults with diabetes    Mean Plasma Glucose 05/08/2022 93.93  mg/dL Final   Performed at [facility], [place] [address] [place], [place] [address]   Preg Test, Ur 05/08/2022 NEGATIVE  NEGATIVE Final   Performed at [facility], [place] [address] [place], [place] [address]   POC Amphetamine UR 05/08/2022 None Detected  NONE DETECTED (Cut Off Level 1000 ng/mL) Final   POC Secobarbital (BAR) 05/08/2022 None Detected  NONE DETECTED (Cut Off Level 300 ng/mL) Final   POC Buprenorphine (BUP) 05/08/2022 None Detected  NONE DETECTED (Cut Off Level 10 ng/mL)  Final   POC Oxazepam (BZO) 05/08/2022 None Detected  NONE DETECTED (Cut Off Level 300 ng/mL) Final   POC Cocaine UR 05/08/2022 None Detected  NONE DETECTED (Cut Off Level 300 ng/mL) Final   POC Methamphetamine UR 05/08/2022 Positive (A)  NONE DETECTED (Cut Off Level 1000 ng/mL) Final   POC Morphine 05/08/2022 None Detected  NONE DETECTED (Cut Off Level 300 ng/mL) Final   POC Methadone UR 05/08/2022 None Detected  NONE DETECTED (Cut Off Level 300 ng/mL) Final   POC Oxycodone UR 05/08/2022 None Detected  NONE DETECTED (Cut Off Level 100 ng/mL) Final   POC Marijuana UR 05/08/2022 None Detected  NONE DETECTED (Cut Off Level 50 ng/mL) Final   SARSCOV2ONAVIRUS 2 AG 05/08/2022 NEGATIVE  NEGATIVE Final   Comment: (NOTE) SARS-CoV-2 antigen NOT DETECTED.   Negative results are presumptive.  Negative results do not preclude SARS-CoV-2 infection and should not be used as the sole basis for treatment or other patient management decisions,  including infection  control decisions, particularly in the presence of clinical signs and  symptoms consistent with COVID-19, or in those who have been in contact with the virus.  Negative results must be combined with clinical observations, patient history, and epidemiological information. The expected result is Negative.  Fact Sheet for Patients: https://www.jennings-kim.com/https://www.fda.gov/media/141569/download  Fact Sheet for Healthcare Providers: https://alexander-rogers.biz/https://www.fda.gov/media/141568/download  This test is not yet approved or cleared by the [place] FDA and  has been authorized for detection and/or diagnosis of SARS-CoV-2 by FDA under an Emergency Use Authorization (EUA).  This EUA will remain in effect (meaning this test can be used) for the duration of  the COV                          ID-19 declaration under Section 564(b)(1) of the Act, 21 U.S.C. section 360bbb-3(b)(1), unless the authorization is terminated or revoked sooner.     Preg Test, Ur 05/08/2022 NEGATIVE  NEGATIVE  Final   Comment:        THE SENSITIVITY OF THIS METHODOLOGY IS >24 mIU/mL    Cholesterol 05/08/2022 187  0 - 200 mg/dL Final   Triglycerides 11/91/478210/12/2021 117  <150 mg/dL Final   HDL 95/62/130810/12/2021 67  >40 mg/dL Final   Total CHOL/HDL Ratio 05/08/2022 2.8  RATIO Final   VLDL 05/08/2022 23  0 - 40 mg/dL Final   LDL Cholesterol 05/08/2022 97  0 - 99 mg/dL Final   Comment:        Total Cholesterol/HDL:CHD Risk Coronary Heart Disease Risk Table                     Men   Women  1/2 Average Risk   3.4   3.3  Average Risk       5.0   4.4  2 X Average Risk   9.6   7.1  3 X Average Risk  23.4   11.0        Use the calculated Patient Ratio above and the CHD Risk Table to determine the patient's CHD Risk.        ATP III CLASSIFICATION (LDL):  <100     mg/dL   Optimal  [phone]  mg/dL   Near or Above                    Optimal  130-159  mg/dL   Borderline  [phone]  mg/dL   High  >841>190     mg/dL   Very High Performed at [facility], [address] [place], [place] [address]    TSH 05/08/2022 3.264  0.350 - 4.500 uIU/mL Final   Comment: Performed by a 3rd Generation assay with a functional sensitivity of <=0.01 uIU/mL. Performed at [facility], [address] [place], [place] [address]   There may be more visits with results that are not included.    Blood Alcohol level:  Lab Results  Component Value Date   ETH <10 08/20/2022   ETH 37 (H) 07/17/2022    Metabolic Disorder Labs: Lab Results  Component Value Date   HGBA1C 5.4 08/20/2022   MPG 108.28 08/20/2022   MPG 93.93 05/08/2022   No results found for: "PROLACTIN" Lab Results  Component Value Date   CHOL 180 08/20/2022   TRIG 181 (H) 08/20/2022   HDL 77 08/20/2022   CHOLHDL 2.3 08/20/2022   VLDL 36 08/20/2022   LDLCALC 67 08/20/2022  LDLCALC 97 05/08/2022    Therapeutic Lab Levels: No results found for: "LITHIUM" No results found for: "VALPROATE" No results found for: "CBMZ"  Physical Findings    AIMS    Flowsheet Row ED to Hosp-Ad[facility] ([facility]  AIMS Total Score 0      AUDIT    Flowsheet Row ED to Hosp-Admission (Discharged) [facility]  Alcohol Use Disorder Identification Test Final Score (AUDIT) 32      CAGE-AID    Flowsheet Row ED from 01/17/2022 in [facility]  CAGE-AID Score 1      GAD-7    Flowsheet Row Office Visit from 03/12/2021 in [facility]  Total GAD-7 Score 20      PHQ2-9    Flowsheet Row ED from 08/20/2022 in [facility] ED from 07/17/2022 in [facility] ED from 05/08/2022 in [facility] Office Visit from 03/12/2021 in [facility]  PHQ-2 Total Score 5 6 2 5   PHQ-9 Total Score 24 22 11 22       Flowsheet Row ED from 08/20/2022 in [facility] ED from 07/23/2022 in [facility] ED from 07/22/2022 in [facility] Department at [facility]  C-SSRS RISK CATEGORY No Risk Low Risk No Risk      Psychiatric Specialty Exam: Physical Exam Constitutional:      Appearance: the patient is not toxic-appearing.  Pulmonary:     Effort: Pulmonary effort is normal.  Neurological:     General: No focal deficit present.     Mental Status: the patient is alert and oriented to person, place, and time.   Review of Systems  Respiratory:  Negative for shortness of breath.   Cardiovascular:  Negative for chest pain.  Gastrointestinal:  Negative for abdominal pain, constipation, diarrhea, nausea and vomiting.  Neurological:  Negative for headaches.      BP 106/73 (BP Location: Right Arm)   Pulse 62   Temp 98.7 F (37.1 C) (Oral)   Resp 16   Ht 5\' 3"  (1.6 m)   Wt 110 lb (49.9 kg)   SpO2 100%   BMI 19.49 kg/m   General  Appearance: disheveled  Eye Contact:  Good  Speech:  Clear and Coherent  Volume:  Normal  Mood:  Euthymic  Affect:  Congruent  Thought Process:  Coherent  Orientation:  Full (Time, Place, and Person)  Thought Content: Logical   Suicidal Thoughts:  No  Homicidal Thoughts:  No  Memory:  Immediate;   Good  Judgement:  fair  Insight:  fair  Psychomotor Activity:  Normal  Concentration:  Concentration: Good  Recall:  Good  Fund of Knowledge: Good  Language: Good  Akathisia:  No  Handed:    AIMS (if indicated): not done  Assets:  Communication Skills Desire for Improvement Leisure Time Physical Health  ADL's:  Intact  Cognition: WNL  Sleep:  Fair     Treatment Plan Summary: Daily contact with patient to assess and evaluate symptoms and progress in treatment and Medication management  Status: Voluntary, no acute safety concerns  Methamphetamine use disorder, alcohol use disorder - Symptomatic management of withdrawal symptoms from methamphetamine - Librium taper due to concern for alcohol withdrawal - Pursue residential rehab placement  Continue home medications as ordered: - Abilify 10 mg  daily - Lexapro 10 mg daily - 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  She had a large PE previously and failed apixaban.  Note from Dr. Marin Olp, oncologist, from 2022 states "she really needs to be on lifelong anticoagulation at this point".  At that time she was anticoagulated with warfarin but seems to switched to Xarelto more recently.  Given this information we will continue Xarelto indefinitely until she can follow-up with primary medical team.  Medical: - Urine pregnancy test negative - Labs otherwise unremarkable   Corky Sox, MD 08/25/2022 2:06 PM

## 2022-08-26 DIAGNOSIS — Z1152 Encounter for screening for COVID-19: Secondary | ICD-10-CM | POA: Diagnosis not present

## 2022-08-26 DIAGNOSIS — Z5900 Homelessness unspecified: Secondary | ICD-10-CM | POA: Diagnosis not present

## 2022-08-26 DIAGNOSIS — F1094 Alcohol use, unspecified with alcohol-induced mood disorder: Secondary | ICD-10-CM | POA: Diagnosis not present

## 2022-08-26 DIAGNOSIS — F1910 Other psychoactive substance abuse, uncomplicated: Secondary | ICD-10-CM | POA: Diagnosis not present

## 2022-08-26 NOTE — ED Provider Notes (Signed)
Behavioral Health Progress Note  Date and Time: 08/26/2022 1:55 PM Name: Kristina Owens MRN:  [id number]  Subjective:   The patient is a 57 year old female well-known to this service line.  She has a history of cocaine, alcohol, and methamphetamine use disorders.  She has been homeless for some time.  She has had many stays at the [facility] facility base crisis.  The last 1 was in December, when she was discharged to [facility] residential.  She reported to LCSW that she was denied because she did not have her Xarelto.  On initial interview the patient appeared to have physical symptoms, possibly of methamphetamine withdrawal (the only substance for which she was positive in her UDS).  Started on Librium taper due to concern for alcohol withdrawal.  Patient accepted at [facility] in [place].  Discharge date is Thursday morning.  Patient will need 14-day supply of medication and paper scripts.  Her sister is going to provide her transportation, getting a ride share at 7:30 AM so the patient can arrive by 10 AM.  The patient denies auditory/visual hallucinations and first rank symptoms. They deny suicidal and homicidal thoughts. The patient denies side effects from their medications.  Review of systems as below.    Diagnosis:  Final diagnoses:  Alcohol use disorder, severe, dependence (HCC)  Alcohol-induced mood disorder with depressive symptoms (HCC)  Polysubstance abuse (HCC)  Homelessness    Total Time spent with patient: 20 minutes  Past Psychiatric History: as above Past Medical History:  Past Medical History:  Diagnosis Date   Anxiety    Depression    DVT (deep vein thrombosis) in pregnancy    GERD (gastroesophageal reflux disease)    Ovarian cyst     Past Surgical History:  Procedure Laterality Date   APPENDECTOMY  age 37   IR PTA VENOUS EXCEPT DIALYSIS CIRCUIT  01/16/2021   IR RADIOLOGIST EVAL & MGMT  03/19/2021   IR THROMBECT VENO MECH MOD SED  01/16/2021   IR US  GUIDE VASC ACCESS LEFT  01/16/2021   IR VENO/EXT/UNI LEFT  01/16/2021   IR VENOCAVAGRAM IVC  01/16/2021   TONSILLECTOMY Bilateral age 51   Family History:  Family History  Problem Relation Age of Onset   Asthma Mother    COPD Mother    Cancer Father        thyroid cancer   Family Psychiatric  History: none Social History:  Social History   Substance and Sexual Activity  Alcohol Use Yes     Social History   Substance and Sexual Activity  Drug Use Yes   Types: "Crack" cocaine, Cocaine, Methamphetamines   Comment: last used 4/31/22    Social History   Socioeconomic History   Marital status: Divorced    Spouse name: Not on file   Number of children: Not on file   Years of education: Not on file   Highest education level: Not on file  Occupational History   Not on file  Tobacco Use   Smoking status: Every Day    Packs/day: 0.50    Years: 35.00    Total pack years: 17.50    Types: Cigarettes   Smokeless tobacco: Never  Vaping Use   Vaping Use: Never used  Substance and Sexual Activity   Alcohol use: Yes   Drug use: Yes    Types: "Crack" cocaine, Cocaine, Methamphetamines    Comment: last used 4/31/22   Sexual activity: Not on file  Other Topics Concern   Not  on file  Social History Narrative   Not on file   Social Determinants of Health   Financial Resource Strain: Not on file  Food Insecurity: Not on file  Transportation Needs: Not on file  Physical Activity: Not on file  Stress: Not on file  Social Connections: Not on file   SDOH:  SDOH Screenings   Alcohol Screen: High Risk (03/10/2022)  Depression (PHQ2-9): High Risk (08/24/2022)  Tobacco Use: High Risk (08/20/2022)   Additional Social History:    Pain Medications: See MAR Prescriptions: See MAR Over the Counter: See MAR History of alcohol / drug use?: Yes Longest period of sobriety (when/how long): 2 months Negative Consequences of Use: Legal Withdrawal Symptoms: Nausea / Vomiting, Tremors,  Fever / Chills, Sweats, Other (Comment) (Headache) Name of Substance 1: Alcohol 1 - Age of First Use: 16 1 - Amount (size/oz): 1 pint 1 - Frequency: daily 1 - Duration: ongoing 1 - Last Use / Amount: 2 hours ago 1 - Method of Aquiring: purchase 1- Route of Use: drinking Name of Substance 2: Methamphetamine 2 - Age of First Use: 47 2 - Amount (size/oz): $20 worth 2 - Frequency: Weekly 2 - Duration: Ongoing 2 - Last Use / Amount: 5 days ago 2 - Method of Aquiring: UTA 2 - Route of Substance Use: intervenous                Sleep: Fair  Appetite:  Fair  Current Medications:  Current Facility-Administered Medications  Medication Dose Route Frequency Provider Last Rate Last Admin   acetaminophen (TYLENOL) tablet 650 mg  650 mg Oral Q6H PRN Rankin, Shuvon B, NP   650 mg at 08/22/22 0920   alum & mag hydroxide-simeth (MAALOX/MYLANTA) 200-200-20 MG/5ML suspension 30 mL  30 mL Oral Q4H PRN Rankin, Shuvon B, NP       ARIPiprazole (ABILIFY) tablet 10 mg  10 mg Oral Daily Rankin, Shuvon B, NP   10 mg at 08/26/22 0915   benztropine (COGENTIN) tablet 0.5 mg  0.5 mg Oral Daily McQuilla, Gerlean RenJai B, MD   0.5 mg at 08/26/22 0915   escitalopram (LEXAPRO) tablet 10 mg  10 mg Oral Daily Rankin, Shuvon B, NP   10 mg at 08/26/22 0915   fluticasone (FLONASE) 50 MCG/ACT nasal spray 1 spray  1 spray Each Nare Daily Carlyn ReichertGabrielle, Lysander Calixte, MD   1 spray at 08/26/22 0915   hydrOXYzine (ATARAX) tablet 10 mg  10 mg Oral TID Eliseo GumMcQuilla, Jai B, MD   10 mg at 08/26/22 0915   magnesium hydroxide (MILK OF MAGNESIA) suspension 30 mL  30 mL Oral Daily PRN Rankin, Shuvon B, NP       multivitamin with minerals tablet 1 tablet  1 tablet Oral Daily Rankin, Shuvon B, NP   1 tablet at 08/26/22 0916   nicotine (NICODERM CQ - dosed in mg/24 hours) patch 21 mg  21 mg Transdermal Daily Rankin, Shuvon B, NP   21 mg at 08/26/22 0915   rivaroxaban (XARELTO) tablet 20 mg  20 mg Oral Daily Rankin, Shuvon B, NP   20 mg at 08/26/22 0915    thiamine (VITAMIN B1) tablet 100 mg  100 mg Oral Daily Carlyn ReichertGabrielle, Tanessa Tidd, MD   100 mg at 08/26/22 0915   traZODone (DESYREL) tablet 50 mg  50 mg Oral QHS PRN Rankin, Shuvon B, NP   50 mg at 08/22/22 2132   Current Outpatient Medications  Medication Sig Dispense Refill   ARIPiprazole (ABILIFY) 10 MG tablet Take 1  tablet (10 mg total) by mouth daily. 30 tablet 0   escitalopram (LEXAPRO) 10 MG tablet Take 1 tablet (10 mg total) by mouth daily. 30 tablet 0   hydrOXYzine (ATARAX) 25 MG tablet Take 1 tablet (25 mg total) by mouth 3 (three) times daily as needed for anxiety. 30 tablet 0   RIVAROXABAN (XARELTO) VTE STARTER PACK (15 & 20 MG) Follow package directions: Take one 15mg  tablet by mouth twice a day. On day 22, switch to one 20mg  tablet once a day. Take with food. (Patient taking differently: Take 15-20 mg by mouth See admin instructions. Follow package directions: Take one 15mg  tablet by mouth twice a day. On day 22, switch to one 20mg  tablet once a day. Take with food.) 51 each 0    Labs  Lab Results:  Admission on 08/20/2022  Component Date Value Ref Range Status   SARS Coronavirus 2 by RT PCR 08/20/2022 NEGATIVE  NEGATIVE Final   Comment: (NOTE) SARS-CoV-2 target nucleic acids are NOT DETECTED.  The SARS-CoV-2 RNA is generally detectable in upper respiratory specimens during the acute phase of infection. The lowest concentration of SARS-CoV-2 viral copies this assay can detect is 138 copies/mL. A negative result does not preclude SARS-Cov-2 infection and should not be used as the sole basis for treatment or other patient management decisions. A negative result may occur with  improper specimen collection/handling, submission of specimen other than nasopharyngeal swab, presence of viral mutation(s) within the areas targeted by this assay, and inadequate number of viral copies(<138 copies/mL). A negative result must be combined with clinical observations, patient history, and  epidemiological information. The expected result is Negative.  Fact Sheet for Patients:  BloggerCourse.com  Fact Sheet for Healthcare Providers:  SeriousBroker.it  This test is no                          t yet approved or cleared by the [place] FDA and  has been authorized for detection and/or diagnosis of SARS-CoV-2 by FDA under an Emergency Use Authorization (EUA). This EUA will remain  in effect (meaning this test can be used) for the duration of the COVID-19 declaration under Section 564(b)(1) of the Act, 21 U.S.C.section 360bbb-3(b)(1), unless the authorization is terminated  or revoked sooner.       Influenza A by PCR 08/20/2022 NEGATIVE  NEGATIVE Final   Influenza B by PCR 08/20/2022 NEGATIVE  NEGATIVE Final   Comment: (NOTE) The Xpert Xpress SARS-CoV-2/FLU/RSV plus assay is intended as an aid in the diagnosis of influenza from Nasopharyngeal swab specimens and should not be used as a sole basis for treatment. Nasal washings and aspirates are unacceptable for Xpert Xpress SARS-CoV-2/FLU/RSV testing.  Fact Sheet for Patients: BloggerCourse.com  Fact Sheet for Healthcare Providers: SeriousBroker.it  This test is not yet approved or cleared by the [place] FDA and has been authorized for detection and/or diagnosis of SARS-CoV-2 by FDA under an Emergency Use Authorization (EUA). This EUA will remain in effect (meaning this test can be used) for the duration of the COVID-19 declaration under Section 564(b)(1) of the Act, 21 U.S.C. section 360bbb-3(b)(1), unless the authorization is terminated or revoked.     Resp Syncytial Virus by PCR 08/20/2022 NEGATIVE  NEGATIVE Final   Comment: (NOTE) Fact Sheet for Patients: BloggerCourse.com  Fact Sheet for Healthcare Providers: SeriousBroker.it  This test is not yet  approved or cleared by the [place] FDA and has been authorized for detection  and/or diagnosis of SARS-CoV-2 by FDA under an Emergency Use Authorization (EUA). This EUA will remain in effect (meaning this test can be used) for the duration of the COVID-19 declaration under Section 564(b)(1) of the Act, 21 U.S.C. section 360bbb-3(b)(1), unless the authorization is terminated or revoked.  Performed at [facility], [address], [place], [place] [address]    WBC 08/20/2022 5.9  4.0 - 10.5 K/uL Final   RBC 08/20/2022 4.89  3.87 - 5.11 MIL/uL Final   Hemoglobin 08/20/2022 15.2 (H)  12.0 - 15.0 g/dL Final   HCT 62/13/0865 44.4  36.0 - 46.0 % Final   MCV 08/20/2022 90.8  80.0 - 100.0 fL Final   MCH 08/20/2022 31.1  26.0 - 34.0 pg Final   MCHC 08/20/2022 34.2  30.0 - 36.0 g/dL Final   RDW 78/46/9629 13.5  11.5 - 15.5 % Final   Platelets 08/20/2022 294  150 - 400 K/uL Final   nRBC 08/20/2022 0.0  0.0 - 0.2 % Final   Neutrophils Relative % 08/20/2022 54  % Final   Neutro Abs 08/20/2022 3.2  1.7 - 7.7 K/uL Final   Lymphocytes Relative 08/20/2022 34  % Final   Lymphs Abs 08/20/2022 2.0  0.7 - 4.0 K/uL Final   Monocytes Relative 08/20/2022 8  % Final   Monocytes Absolute 08/20/2022 0.5  0.1 - 1.0 K/uL Final   Eosinophils Relative 08/20/2022 2  % Final   Eosinophils Absolute 08/20/2022 0.1  0.0 - 0.5 K/uL Final   Basophils Relative 08/20/2022 1  % Final   Basophils Absolute 08/20/2022 0.1  0.0 - 0.1 K/uL Final   Immature Granulocytes 08/20/2022 1  % Final   Abs Immature Granulocytes 08/20/2022 0.04  0.00 - 0.07 K/uL Final   Performed at [facility], [address] [place], [place] [address]   Sodium 08/20/2022 140  135 - 145 mmol/L Final   Potassium 08/20/2022 3.9  3.5 - 5.1 mmol/L Final   Chloride 08/20/2022 105  98 - 111 mmol/L Final   CO2 08/20/2022 24  22 - 32 mmol/L Final   Glucose, Bld 08/20/2022 84  70 - 99 mg/dL Final   Glucose reference range applies only to  samples taken after fasting for at least 8 hours.   BUN 08/20/2022 5 (L)  6 - 20 mg/dL Final   Creatinine, Ser 08/20/2022 0.60  0.44 - 1.00 mg/dL Final   Calcium 32/44/0102 9.3  8.9 - 10.3 mg/dL Final   Total Protein 72/53/6644 6.8  6.5 - 8.1 g/dL Final   Albumin 03/47/4259 3.9  3.5 - 5.0 g/dL Final   AST 56/38/7564 16  15 - 41 U/L Final   ALT 08/20/2022 15  0 - 44 U/L Final   Alkaline Phosphatase 08/20/2022 91  38 - 126 U/L Final   Total Bilirubin 08/20/2022 0.2 (L)  0.3 - 1.2 mg/dL Final   GFR, Estimated 08/20/2022 >60  >60 mL/min Final   Comment: (NOTE) Calculated using the CKD-EPI Creatinine Equation (2021)    Anion gap 08/20/2022 11  5 - 15 Final   Performed at [facility], [address], [place], [place] [address]   Hgb A1c MFr Bld 08/20/2022 5.4  4.8 - 5.6 % Final   Comment: (NOTE) Pre diabetes:          5.7%-6.4%  Diabetes:              >6.4%  Glycemic control for   <7.0% adults with diabetes  Mean Plasma Glucose 08/20/2022 108.28  mg/dL Final   Performed at [facility], [address] [place], [place] [address]   Magnesium 08/20/2022 1.9  1.7 - 2.4 mg/dL Final   Performed at [facility], [address], [place], [place] [address]   Alcohol, Ethyl (B) 08/20/2022 <10  <10 mg/dL Final   Comment: (NOTE) Lowest detectable limit for serum alcohol is 10 mg/dL.  For medical purposes only. Performed at [facility], [address] [place], [place] [address]    Cholesterol 08/20/2022 180  0 - 200 mg/dL Final   Triglycerides 91/47/8295 181 (H)  <150 mg/dL Final   HDL 62/13/0865 77  >40 mg/dL Final   Total CHOL/HDL Ratio 08/20/2022 2.3  RATIO Final   VLDL 08/20/2022 36  0 - 40 mg/dL Final   LDL Cholesterol 08/20/2022 67  0 - 99 mg/dL Final   Comment:        Total Cholesterol/HDL:CHD Risk Coronary Heart Disease Risk Table                     Men   Women  1/2 Average Risk   3.4   3.3  Average Risk       5.0   4.4  2 X Average Risk   9.6    7.1  3 X Average Risk  23.4   11.0        Use the calculated Patient Ratio above and the CHD Risk Table to determine the patient's CHD Risk.        ATP III CLASSIFICATION (LDL):  <100     mg/dL   Optimal  784-696  mg/dL   Near or Above                    Optimal  130-159  mg/dL   Borderline  295-284  mg/dL   High  >132     mg/dL   Very High Performed at [facility], [address] [place], [place] [address]    Color, Urine 08/20/2022 YELLOW  YELLOW Final   APPearance 08/20/2022 CLEAR  CLEAR Final   Specific Gravity, Urine 08/20/2022 1.020  1.005 - 1.030 Final   pH 08/20/2022 5.0  5.0 - 8.0 Final   Glucose, UA 08/20/2022 NEGATIVE  NEGATIVE mg/dL Final   Hgb urine dipstick 08/20/2022 NEGATIVE  NEGATIVE Final   Bilirubin Urine 08/20/2022 NEGATIVE  NEGATIVE Final   Ketones, ur 08/20/2022 NEGATIVE  NEGATIVE mg/dL Final   Protein, ur 27/25/3664 NEGATIVE  NEGATIVE mg/dL Final   Nitrite 40/34/7425 NEGATIVE  NEGATIVE Final   Leukocytes,Ua 08/20/2022 NEGATIVE  NEGATIVE Final   Performed at [facility], [address] [place], [place] [address]   Preg Test, Ur 08/20/2022 Negative  Negative Final   POC Amphetamine UR 08/20/2022 None Detected  NONE DETECTED (Cut Off Level 1000 ng/mL) Final   POC Secobarbital (BAR) 08/20/2022 None Detected  NONE DETECTED (Cut Off Level 300 ng/mL) Final   POC Buprenorphine (BUP) 08/20/2022 None Detected  NONE DETECTED (Cut Off Level 10 ng/mL) Final   POC Oxazepam (BZO) 08/20/2022 None Detected  NONE DETECTED (Cut Off Level 300 ng/mL) Final   POC Cocaine UR 08/20/2022 None Detected  NONE DETECTED (Cut Off Level 300 ng/mL) Final   POC Methamphetamine UR 08/20/2022 Positive (A)  NONE DETECTED (Cut Off Level 1000 ng/mL) Final   POC Morphine 08/20/2022 None Detected  NONE DETECTED (Cut Off Level 300  ng/mL) Final   POC Methadone UR 08/20/2022 None Detected  NONE DETECTED (Cut Off Level 300 ng/mL) Final   POC Oxycodone UR 08/20/2022 None Detected  NONE  DETECTED (Cut Off Level 100 ng/mL) Final   POC Marijuana UR 08/20/2022 None Detected  NONE DETECTED (Cut Off Level 50 ng/mL) Final   HIV Screen 4th Generation wRfx 08/20/2022 Non Reactive  Non Reactive Final   Performed at [facility], [address] [place], [place] [address]   SARSCOV2ONAVIRUS 2 AG 08/20/2022 NEGATIVE  NEGATIVE Final   Comment: (NOTE) SARS-CoV-2 antigen NOT DETECTED.   Negative results are presumptive.  Negative results do not preclude SARS-CoV-2 infection and should not be used as the sole basis for treatment or other patient management decisions, including infection  control decisions, particularly in the presence of clinical signs and  symptoms consistent with COVID-19, or in those who have been in contact with the virus.  Negative results must be combined with clinical observations, patient history, and epidemiological information. The expected result is Negative.  Fact Sheet for Patients: https://www.jennings-kim.com/https://www.fda.gov/media/141569/download  Fact Sheet for Healthcare Providers: https://alexander-rogers.biz/https://www.fda.gov/media/141568/download  This test is not yet approved or cleared by the [place] FDA and  has been authorized for detection and/or diagnosis of SARS-CoV-2 by FDA under an Emergency Use Authorization (EUA).  This EUA will remain in effect (meaning this test can be used) for the duration of  the COV                          ID-19 declaration under Section 564(b)(1) of the Act, 21 U.S.C. section 360bbb-3(b)(1), unless the authorization is terminated or revoked sooner.     Preg Test, Ur 08/20/2022 NEGATIVE  NEGATIVE Final   Comment:        THE SENSITIVITY OF THIS METHODOLOGY IS >24 mIU/mL    SARS Coronavirus 2 by RT PCR 08/22/2022 NEGATIVE  NEGATIVE Final   Comment: (NOTE) SARS-CoV-2 target nucleic acids are NOT DETECTED.  The SARS-CoV-2 RNA is generally detectable in upper respiratory specimens during the acute phase of infection. The lowest concentration of  SARS-CoV-2 viral copies this assay can detect is 138 copies/mL. A negative result does not preclude SARS-Cov-2 infection and should not be used as the sole basis for treatment or other patient management decisions. A negative result may occur with  improper specimen collection/handling, submission of specimen other than nasopharyngeal swab, presence of viral mutation(s) within the areas targeted by this assay, and inadequate number of viral copies(<138 copies/mL). A negative result must be combined with clinical observations, patient history, and epidemiological information. The expected result is Negative.  Fact Sheet for Patients:  BloggerCourse.comhttps://www.fda.gov/media/152166/download  Fact Sheet for Healthcare Providers:  SeriousBroker.ithttps://www.fda.gov/media/152162/download  This test is no                          t yet approved or cleared by the [place] FDA and  has been authorized for detection and/or diagnosis of SARS-CoV-2 by FDA under an Emergency Use Authorization (EUA). This EUA will remain  in effect (meaning this test can be used) for the duration of the COVID-19 declaration under Section 564(b)(1) of the Act, 21 U.S.C.section 360bbb-3(b)(1), unless the authorization is terminated  or revoked sooner.       Influenza A by PCR 08/22/2022 NEGATIVE  NEGATIVE Final   Influenza B by PCR 08/22/2022 NEGATIVE  NEGATIVE Final   Comment: (NOTE) The Xpert Xpress  SARS-CoV-2/FLU/RSV plus assay is intended as an aid in the diagnosis of influenza from Nasopharyngeal swab specimens and should not be used as a sole basis for treatment. Nasal washings and aspirates are unacceptable for Xpert Xpress SARS-CoV-2/FLU/RSV testing.  Fact Sheet for Patients: BloggerCourse.com  Fact Sheet for Healthcare Providers: SeriousBroker.it  This test is not yet approved or cleared by the [place] FDA and has been authorized for detection and/or diagnosis of  SARS-CoV-2 by FDA under an Emergency Use Authorization (EUA). This EUA will remain in effect (meaning this test can be used) for the duration of the COVID-19 declaration under Section 564(b)(1) of the Act, 21 U.S.C. section 360bbb-3(b)(1), unless the authorization is terminated or revoked.     Resp Syncytial Virus by PCR 08/22/2022 NEGATIVE  NEGATIVE Final   Comment: (NOTE) Fact Sheet for Patients: BloggerCourse.com  Fact Sheet for Healthcare Providers: SeriousBroker.it  This test is not yet approved or cleared by the [place] FDA and has been authorized for detection and/or diagnosis of SARS-CoV-2 by FDA under an Emergency Use Authorization (EUA). This EUA will remain in effect (meaning this test can be used) for the duration of the COVID-19 declaration under Section 564(b)(1) of the Act, 21 U.S.C. section 360bbb-3(b)(1), unless the authorization is terminated or revoked.  Performed at [facility], [address] [place], [place] [address]   Admission on 07/22/2022, Discharged on 07/23/2022  Component Date Value Ref Range Status   SARS Coronavirus 2 by RT PCR 07/22/2022 NEGATIVE  NEGATIVE Final   Comment: (NOTE) SARS-CoV-2 target nucleic acids are NOT DETECTED.  The SARS-CoV-2 RNA is generally detectable in upper respiratory specimens during the acute phase of infection. The lowest concentration of SARS-CoV-2 viral copies this assay can detect is 138 copies/mL. A negative result does not preclude SARS-Cov-2 infection and should not be used as the sole basis for treatment or other patient management decisions. A negative result may occur with  improper specimen collection/handling, submission of specimen other than nasopharyngeal swab, presence of viral mutation(s) within the areas targeted by this assay, and inadequate number of viral copies(<138 copies/mL). A negative result must be combined with clinical  observations, patient history, and epidemiological information. The expected result is Negative.  Fact Sheet for Patients:  BloggerCourse.com  Fact Sheet for Healthcare Providers:  SeriousBroker.it  This test is no                          t yet approved or cleared by the [place] FDA and  has been authorized for detection and/or diagnosis of SARS-CoV-2 by FDA under an Emergency Use Authorization (EUA). This EUA will remain  in effect (meaning this test can be used) for the duration of the COVID-19 declaration under Section 564(b)(1) of the Act, 21 U.S.C.section 360bbb-3(b)(1), unless the authorization is terminated  or revoked sooner.       Influenza A by PCR 07/22/2022 NEGATIVE  NEGATIVE Final   Influenza B by PCR 07/22/2022 NEGATIVE  NEGATIVE Final   Comment: (NOTE) The Xpert Xpress SARS-CoV-2/FLU/RSV plus assay is intended as an aid in the diagnosis of influenza from Nasopharyngeal swab specimens and should not be used as a sole basis for treatment. Nasal washings and aspirates are unacceptable for Xpert Xpress SARS-CoV-2/FLU/RSV testing.  Fact Sheet for Patients: BloggerCourse.com  Fact Sheet for Healthcare Providers: SeriousBroker.it  This test is not yet approved or cleared by the [place] FDA and has been authorized for detection and/or  diagnosis of SARS-CoV-2 by FDA under an Emergency Use Authorization (EUA). This EUA will remain in effect (meaning this test can be used) for the duration of the COVID-19 declaration under Section 564(b)(1) of the Act, 21 U.S.C. section 360bbb-3(b)(1), unless the authorization is terminated or revoked.     Resp Syncytial Virus by PCR 07/22/2022 NEGATIVE  NEGATIVE Final   Comment: (NOTE) Fact Sheet for Patients: BloggerCourse.com  Fact Sheet for Healthcare  Providers: SeriousBroker.it  This test is not yet approved or cleared by the [place] FDA and has been authorized for detection and/or diagnosis of SARS-CoV-2 by FDA under an Emergency Use Authorization (EUA). This EUA will remain in effect (meaning this test can be used) for the duration of the COVID-19 declaration under Section 564(b)(1) of the Act, 21 U.S.C. section 360bbb-3(b)(1), unless the authorization is terminated or revoked.  Performed at [facility], [address] [place], [place] [address]    Sodium 07/22/2022 136  135 - 145 mmol/L Final   Potassium 07/22/2022 4.5  3.5 - 5.1 mmol/L Final   Chloride 07/22/2022 106  98 - 111 mmol/L Final   CO2 07/22/2022 22  22 - 32 mmol/L Final   Glucose, Bld 07/22/2022 87  70 - 99 mg/dL Final   Glucose reference range applies only to samples taken after fasting for at least 8 hours.   BUN 07/22/2022 18  6 - 20 mg/dL Final   Creatinine, Ser 07/22/2022 0.61  0.44 - 1.00 mg/dL Final   Calcium 60/45/4098 9.3  8.9 - 10.3 mg/dL Final   Total Protein 11/91/4782 7.1  6.5 - 8.1 g/dL Final   Albumin 95/62/1308 4.0  3.5 - 5.0 g/dL Final   AST 65/78/4696 21  15 - 41 U/L Final   ALT 07/22/2022 18  0 - 44 U/L Final   Alkaline Phosphatase 07/22/2022 67  38 - 126 U/L Final   Total Bilirubin 07/22/2022 0.1 (L)  0.3 - 1.2 mg/dL Final   GFR, Estimated 07/22/2022 >60  >60 mL/min Final   Comment: (NOTE) Calculated using the CKD-EPI Creatinine Equation (2021)    Anion gap 07/22/2022 8  5 - 15 Final   Performed at [facility], [address] [place], [place] [address]   WBC 07/22/2022 7.2  4.0 - 10.5 K/uL Final   RBC 07/22/2022 5.27 (H)  3.87 - 5.11 MIL/uL Final   Hemoglobin 07/22/2022 15.8 (H)  12.0 - 15.0 g/dL Final   HCT 41/32/4401 49.0 (H)  36.0 - 46.0 % Final   MCV 07/22/2022 93.0  80.0 - 100.0 fL Final   MCH 07/22/2022 30.0  26.0 - 34.0 pg Final   MCHC 07/22/2022 32.2  30.0 - 36.0 g/dL Final   RDW  02/72/5366 13.7  11.5 - 15.5 % Final   Platelets 07/22/2022 276  150 - 400 K/uL Final   nRBC 07/22/2022 0.0  0.0 - 0.2 % Final   Performed at [facility], [address], [place], [place] [address]   I-stat hCG, quantitative 07/22/2022 <5.0  <5 mIU/mL Final   Comment 3 07/22/2022          Final   Comment:   GEST. AGE      CONC.  (mIU/mL)   <=1 WEEK        5 - 50     2 WEEKS       50 - 500     3 WEEKS       100 - 10,000  4 WEEKS     1,000 - 30,000        FEMALE AND NON-PREGNANT FEMALE:     LESS THAN 5 mIU/mL   Admission on 07/17/2022, Discharged on 07/22/2022  Component Date Value Ref Range Status   SARS Coronavirus 2 by RT PCR 07/17/2022 NEGATIVE  NEGATIVE Final   Comment: (NOTE) SARS-CoV-2 target nucleic acids are NOT DETECTED.  The SARS-CoV-2 RNA is generally detectable in upper respiratory specimens during the acute phase of infection. The lowest concentration of SARS-CoV-2 viral copies this assay can detect is 138 copies/mL. A negative result does not preclude SARS-Cov-2 infection and should not be used as the sole basis for treatment or other patient management decisions. A negative result may occur with  improper specimen collection/handling, submission of specimen other than nasopharyngeal swab, presence of viral mutation(s) within the areas targeted by this assay, and inadequate number of viral copies(<138 copies/mL). A negative result must be combined with clinical observations, patient history, and epidemiological information. The expected result is Negative.  Fact Sheet for Patients:  BloggerCourse.com  Fact Sheet for Healthcare Providers:  SeriousBroker.it  This test is no                          t yet approved or cleared by the [place] FDA and  has been authorized for detection and/or diagnosis of SARS-CoV-2 by FDA under an Emergency Use Authorization (EUA). This EUA will remain  in effect  (meaning this test can be used) for the duration of the COVID-19 declaration under Section 564(b)(1) of the Act, 21 U.S.C.section 360bbb-3(b)(1), unless the authorization is terminated  or revoked sooner.       Influenza A by PCR 07/17/2022 NEGATIVE  NEGATIVE Final   Influenza B by PCR 07/17/2022 NEGATIVE  NEGATIVE Final   Comment: (NOTE) The Xpert Xpress SARS-CoV-2/FLU/RSV plus assay is intended as an aid in the diagnosis of influenza from Nasopharyngeal swab specimens and should not be used as a sole basis for treatment. Nasal washings and aspirates are unacceptable for Xpert Xpress SARS-CoV-2/FLU/RSV testing.  Fact Sheet for Patients: BloggerCourse.com  Fact Sheet for Healthcare Providers: SeriousBroker.it  This test is not yet approved or cleared by the [place] FDA and has been authorized for detection and/or diagnosis of SARS-CoV-2 by FDA under an Emergency Use Authorization (EUA). This EUA will remain in effect (meaning this test can be used) for the duration of the COVID-19 declaration under Section 564(b)(1) of the Act, 21 U.S.C. section 360bbb-3(b)(1), unless the authorization is terminated or revoked.     Resp Syncytial Virus by PCR 07/17/2022 NEGATIVE  NEGATIVE Final   Comment: (NOTE) Fact Sheet for Patients: BloggerCourse.com  Fact Sheet for Healthcare Providers: SeriousBroker.it  This test is not yet approved or cleared by the [place] FDA and has been authorized for detection and/or diagnosis of SARS-CoV-2 by FDA under an Emergency Use Authorization (EUA). This EUA will remain in effect (meaning this test can be used) for the duration of the COVID-19 declaration under Section 564(b)(1) of the Act, 21 U.S.C. section 360bbb-3(b)(1), unless the authorization is terminated or revoked.  Performed at [facility], [address] [place],  [place] [address]    WBC 07/17/2022 8.9  4.0 - 10.5 K/uL Final   RBC 07/17/2022 4.64  3.87 - 5.11 MIL/uL Final   Hemoglobin 07/17/2022 14.2  12.0 - 15.0 g/dL Final   HCT 60/45/4098 41.6  36.0 - 46.0 % Final  MCV 07/17/2022 89.7  80.0 - 100.0 fL Final   MCH 07/17/2022 30.6  26.0 - 34.0 pg Final   MCHC 07/17/2022 34.1  30.0 - 36.0 g/dL Final   RDW 91/47/8295 13.7  11.5 - 15.5 % Final   Platelets 07/17/2022 306  150 - 400 K/uL Final   nRBC 07/17/2022 0.0  0.0 - 0.2 % Final   Neutrophils Relative % 07/17/2022 59  % Final   Neutro Abs 07/17/2022 5.2  1.7 - 7.7 K/uL Final   Lymphocytes Relative 07/17/2022 35  % Final   Lymphs Abs 07/17/2022 3.1  0.7 - 4.0 K/uL Final   Monocytes Relative 07/17/2022 5  % Final   Monocytes Absolute 07/17/2022 0.5  0.1 - 1.0 K/uL Final   Eosinophils Relative 07/17/2022 1  % Final   Eosinophils Absolute 07/17/2022 0.1  0.0 - 0.5 K/uL Final   Basophils Relative 07/17/2022 0  % Final   Basophils Absolute 07/17/2022 0.0  0.0 - 0.1 K/uL Final   Immature Granulocytes 07/17/2022 0  % Final   Abs Immature Granulocytes 07/17/2022 0.04  0.00 - 0.07 K/uL Final   Performed at [facility], [address], [place], [place] [address]   Sodium 07/17/2022 139  135 - 145 mmol/L Final   Potassium 07/17/2022 3.7  3.5 - 5.1 mmol/L Final   Chloride 07/17/2022 104  98 - 111 mmol/L Final   CO2 07/17/2022 24  22 - 32 mmol/L Final   Glucose, Bld 07/17/2022 51 (L)  70 - 99 mg/dL Final   Glucose reference range applies only to samples taken after fasting for at least 8 hours.   BUN 07/17/2022 8  6 - 20 mg/dL Final   Creatinine, Ser 07/17/2022 0.69  0.44 - 1.00 mg/dL Final   Calcium 86/57/8469 8.8 (L)  8.9 - 10.3 mg/dL Final   Total Protein 62/95/2841 6.2 (L)  6.5 - 8.1 g/dL Final   Albumin 32/44/0102 3.6  3.5 - 5.0 g/dL Final   AST 72/53/6644 23  15 - 41 U/L Final   ALT 07/17/2022 19  0 - 44 U/L Final   Alkaline Phosphatase 07/17/2022 82  38 - 126 U/L Final   Total Bilirubin  07/17/2022 0.6  0.3 - 1.2 mg/dL Final   GFR, Estimated 07/17/2022 >60  >60 mL/min Final   Comment: (NOTE) Calculated using the CKD-EPI Creatinine Equation (2021)    Anion gap 07/17/2022 11  5 - 15 Final   Performed at [facility], [address] [place], [place] [address]   Alcohol, Ethyl (B) 07/17/2022 37 (H)  <10 mg/dL Final   Comment: (NOTE) Lowest detectable limit for serum alcohol is 10 mg/dL.  For medical purposes only. Performed at [facility], [address], [place], [place] [address]    Neisseria Gonorrhea 07/17/2022 Negative   Final   Chlamydia 07/17/2022 Positive (A)   Final   Comment 07/17/2022 Normal Reference Ranger Chlamydia - Negative   Final   Comment 07/17/2022 Normal Reference Range Neisseria Gonorrhea - Negative   Final   POC Amphetamine UR 07/17/2022 None Detected  NONE DETECTED (Cut Off Level 1000 ng/mL) Final   POC Secobarbital (BAR) 07/17/2022 None Detected  NONE DETECTED (Cut Off Level 300 ng/mL) Final   POC Buprenorphine (BUP) 07/17/2022 None Detected  NONE DETECTED (Cut Off Level 10 ng/mL) Final   POC Oxazepam (BZO) 07/17/2022 Positive (A)  NONE DETECTED (Cut Off Level 300 ng/mL) Final   POC Cocaine UR 07/17/2022 None Detected  NONE DETECTED (Cut Off Level 300 ng/mL) Final   POC Methamphetamine UR 07/17/2022 None Detected  NONE DETECTED (Cut Off Level 1000 ng/mL) Final   POC Morphine 07/17/2022 None Detected  NONE DETECTED (Cut Off Level 300 ng/mL) Final   POC Methadone UR 07/17/2022 None Detected  NONE DETECTED (Cut Off Level 300 ng/mL) Final   POC Oxycodone UR 07/17/2022 None Detected  NONE DETECTED (Cut Off Level 100 ng/mL) Final   POC Marijuana UR 07/17/2022 None Detected  NONE DETECTED (Cut Off Level 50 ng/mL) Final   Preg Test, Ur 07/17/2022 Negative  Negative Final   RPR Ser Ql 07/17/2022 NON REACTIVE  NON REACTIVE Final   Performed at [facility], [address] [place], [place] [address]   HIV Screen 4th Generation wRfx  07/17/2022 Non Reactive  Non Reactive Final   Performed at [facility], [address] [place], [place] [address]   SARSCOV2ONAVIRUS 2 AG 07/17/2022 NEGATIVE  NEGATIVE Final   Comment: (NOTE) SARS-CoV-2 antigen NOT DETECTED.   Negative results are presumptive.  Negative results do not preclude SARS-CoV-2 infection and should not be used as the sole basis for treatment or other patient management decisions, including infection  control decisions, particularly in the presence of clinical signs and  symptoms consistent with COVID-19, or in those who have been in contact with the virus.  Negative results must be combined with clinical observations, patient history, and epidemiological information. The expected result is Negative.  Fact Sheet for Patients: https://www.jennings-kim.com/  Fact Sheet for Healthcare Providers: https://alexander-rogers.biz/  This test is not yet approved or cleared by the [place] FDA and  has been authorized for detection and/or diagnosis of SARS-CoV-2 by FDA under an Emergency Use Authorization (EUA).  This EUA will remain in effect (meaning this test can be used) for the duration of  the COV                          ID-19 declaration under Section 564(b)(1) of the Act, 21 U.S.C. section 360bbb-3(b)(1), unless the authorization is terminated or revoked sooner.     Preg Test, Ur 07/17/2022 NEGATIVE  NEGATIVE Final   Comment:        THE SENSITIVITY OF THIS METHODOLOGY IS >24 mIU/mL   Admission on 07/14/2022, Discharged on 07/14/2022  Component Date Value Ref Range Status   SARS Coronavirus 2 by RT PCR 07/14/2022 NEGATIVE  NEGATIVE Final   Comment: (NOTE) SARS-CoV-2 target nucleic acids are NOT DETECTED.  The SARS-CoV-2 RNA is generally detectable in upper respiratory specimens during the acute phase of infection. The lowest concentration of SARS-CoV-2 viral copies this assay can detect is 138 copies/mL. A  negative result does not preclude SARS-Cov-2 infection and should not be used as the sole basis for treatment or other patient management decisions. A negative result may occur with  improper specimen collection/handling, submission of specimen other than nasopharyngeal swab, presence of viral mutation(s) within the areas targeted by this assay, and inadequate number of viral copies(<138 copies/mL). A negative result must be combined with clinical observations, patient history, and epidemiological information. The expected result is Negative.  Fact Sheet for Patients:  BloggerCourse.com  Fact Sheet for Healthcare Providers:  SeriousBroker.it  This test is no                          t yet approved or cleared by the [place] FDA  and  has been authorized for detection and/or diagnosis of SARS-CoV-2 by FDA under an Emergency Use Authorization (EUA). This EUA will remain  in effect (meaning this test can be used) for the duration of the COVID-19 declaration under Section 564(b)(1) of the Act, 21 U.S.C.section 360bbb-3(b)(1), unless the authorization is terminated  or revoked sooner.       Influenza A by PCR 07/14/2022 NEGATIVE  NEGATIVE Final   Influenza B by PCR 07/14/2022 NEGATIVE  NEGATIVE Final   Comment: (NOTE) The Xpert Xpress SARS-CoV-2/FLU/RSV plus assay is intended as an aid in the diagnosis of influenza from Nasopharyngeal swab specimens and should not be used as a sole basis for treatment. Nasal washings and aspirates are unacceptable for Xpert Xpress SARS-CoV-2/FLU/RSV testing.  Fact Sheet for Patients: BloggerCourse.com  Fact Sheet for Healthcare Providers: SeriousBroker.it  This test is not yet approved or cleared by the [place] FDA and has been authorized for detection and/or diagnosis of SARS-CoV-2 by FDA under an Emergency Use Authorization (EUA). This  EUA will remain in effect (meaning this test can be used) for the duration of the COVID-19 declaration under Section 564(b)(1) of the Act, 21 U.S.C. section 360bbb-3(b)(1), unless the authorization is terminated or revoked.  Performed at [facility], [address], [place], [place] [address]   Admission on 07/11/2022, Discharged on 07/11/2022  Component Date Value Ref Range Status   WBC 07/11/2022 9.7  4.0 - 10.5 K/uL Final   RBC 07/11/2022 5.26 (H)  3.87 - 5.11 MIL/uL Final   Hemoglobin 07/11/2022 15.9 (H)  12.0 - 15.0 g/dL Final   HCT 40/98/1191 48.3 (H)  36.0 - 46.0 % Final   MCV 07/11/2022 91.8  80.0 - 100.0 fL Final   MCH 07/11/2022 30.2  26.0 - 34.0 pg Final   MCHC 07/11/2022 32.9  30.0 - 36.0 g/dL Final   RDW 47/82/9562 13.5  11.5 - 15.5 % Final   Platelets 07/11/2022 282  150 - 400 K/uL Final   nRBC 07/11/2022 0.0  0.0 - 0.2 % Final   Neutrophils Relative % 07/11/2022 63  % Final   Neutro Abs 07/11/2022 6.1  1.7 - 7.7 K/uL Final   Lymphocytes Relative 07/11/2022 29  % Final   Lymphs Abs 07/11/2022 2.8  0.7 - 4.0 K/uL Final   Monocytes Relative 07/11/2022 7  % Final   Monocytes Absolute 07/11/2022 0.7  0.1 - 1.0 K/uL Final   Eosinophils Relative 07/11/2022 1  % Final   Eosinophils Absolute 07/11/2022 0.1  0.0 - 0.5 K/uL Final   Basophils Relative 07/11/2022 0  % Final   Basophils Absolute 07/11/2022 0.0  0.0 - 0.1 K/uL Final   Immature Granulocytes 07/11/2022 0  % Final   Abs Immature Granulocytes 07/11/2022 0.03  0.00 - 0.07 K/uL Final   Performed at [facility], [address] [place], [place] [address]   Sodium 07/11/2022 139  135 - 145 mmol/L Final   Potassium 07/11/2022 3.8  3.5 - 5.1 mmol/L Final   Chloride 07/11/2022 105  98 - 111 mmol/L Final   CO2 07/11/2022 24  22 - 32 mmol/L Final   Glucose, Bld 07/11/2022 90  70 - 99 mg/dL Final   Glucose reference range applies only to samples taken after fasting for at least 8 hours.   BUN  07/11/2022 9  6 - 20 mg/dL Final   Creatinine, Ser 07/11/2022 0.57  0.44 - 1.00 mg/dL Final   Calcium 57/84/6962 8.5 (L)  8.9 - 10.3 mg/dL Final   Total Protein 45/40/9811 6.3 (L)  6.5 - 8.1 g/dL Final   Albumin 91/47/8295 3.6  3.5 - 5.0 g/dL Final   AST 62/13/0865 19  15 - 41 U/L Final   ALT 07/11/2022 16  0 - 44 U/L Final   Alkaline Phosphatase 07/11/2022 78  38 - 126 U/L Final   Total Bilirubin 07/11/2022 0.4  0.3 - 1.2 mg/dL Final   GFR, Estimated 07/11/2022 >60  >60 mL/min Final   Comment: (NOTE) Calculated using the CKD-EPI Creatinine Equation (2021)    Anion gap 07/11/2022 10  5 - 15 Final   Performed at [facility], [address] [place], [place] [address]   Troponin I (High Sensitivity) 07/11/2022 <2  <18 ng/L Final   Comment: (NOTE) Elevated high sensitivity troponin I (hsTnI) values and significant  changes across serial measurements may suggest ACS but many other  chronic and acute conditions are known to elevate hsTnI results.  Refer to the "Links" section for chest pain algorithms and additional  guidance. Performed at [facility], [address] [place], [place] [address]    Lipase 07/11/2022 35  11 - 51 U/L Final   Performed at [facility], [address] [place], [place] [address]   D-Dimer, Quant 07/11/2022 <0.27  0.00 - 0.50 ug/mL-FEU Final   Comment: (NOTE) At the manufacturer cut-off value of 0.5 g/mL FEU, this assay has a negative predictive value of 95-100%.This assay is intended for use in conjunction with a clinical pretest probability (PTP) assessment model to exclude pulmonary embolism (PE) and deep venous thrombosis (DVT) in outpatients suspected of PE or DVT. Results should be correlated with clinical presentation. Performed at [facility], [address], [place], [place] [address]    Color, Urine 07/11/2022 YELLOW  YELLOW Final   APPearance 07/11/2022 HAZY (A)  CLEAR Final   Specific Gravity, Urine 07/11/2022  1.024  1.005 - 1.030 Final   pH 07/11/2022 6.0  5.0 - 8.0 Final   Glucose, UA 07/11/2022 NEGATIVE  NEGATIVE mg/dL Final   Hgb urine dipstick 07/11/2022 NEGATIVE  NEGATIVE Final   Bilirubin Urine 07/11/2022 NEGATIVE  NEGATIVE Final   Ketones, ur 07/11/2022 NEGATIVE  NEGATIVE mg/dL Final   Protein, ur 27/25/3664 NEGATIVE  NEGATIVE mg/dL Final   Nitrite 40/34/7425 NEGATIVE  NEGATIVE Final   Leukocytes,Ua 07/11/2022 NEGATIVE  NEGATIVE Final   Performed at [facility], [address], [place], [place] [address]   SARS Coronavirus 2 by RT PCR 07/11/2022 NEGATIVE  NEGATIVE Final   Comment: (NOTE) SARS-CoV-2 target nucleic acids are NOT DETECTED.  The SARS-CoV-2 RNA is generally detectable in upper respiratory specimens during the acute phase of infection. The lowest concentration of SARS-CoV-2 viral copies this assay can detect is 138 copies/mL. A negative result does not preclude SARS-Cov-2 infection and should not be used as the sole basis for treatment or other patient management decisions. A negative result may occur with  improper specimen collection/handling, submission of specimen other than nasopharyngeal swab, presence of viral mutation(s) within the areas targeted by this assay, and inadequate number of viral copies(<138 copies/mL). A negative result must be combined with clinical observations, patient history, and epidemiological information. The expected result is Negative.  Fact Sheet for Patients:  BloggerCourse.com  Fact Sheet for Healthcare Providers:  SeriousBroker.it  This test is no  t yet approved or cleared by the [place] FDA and  has been authorized for detection and/or diagnosis of SARS-CoV-2 by FDA under an Emergency Use Authorization (EUA). This EUA will remain  in effect (meaning this test can be used) for the duration of the COVID-19 declaration under Section 564(b)(1) of  the Act, 21 U.S.C.section 360bbb-3(b)(1), unless the authorization is terminated  or revoked sooner.       Influenza A by PCR 07/11/2022 NEGATIVE  NEGATIVE Final   Influenza B by PCR 07/11/2022 NEGATIVE  NEGATIVE Final   Comment: (NOTE) The Xpert Xpress SARS-CoV-2/FLU/RSV plus assay is intended as an aid in the diagnosis of influenza from Nasopharyngeal swab specimens and should not be used as a sole basis for treatment. Nasal washings and aspirates are unacceptable for Xpert Xpress SARS-CoV-2/FLU/RSV testing.  Fact Sheet for Patients: BloggerCourse.comhttps://www.fda.gov/media/152166/download  Fact Sheet for Healthcare Providers: SeriousBroker.ithttps://www.fda.gov/media/152162/download  This test is not yet approved or cleared by the [place] FDA and has been authorized for detection and/or diagnosis of SARS-CoV-2 by FDA under an Emergency Use Authorization (EUA). This EUA will remain in effect (meaning this test can be used) for the duration of the COVID-19 declaration under Section 564(b)(1) of the Act, 21 U.S.C. section 360bbb-3(b)(1), unless the authorization is terminated or revoked.  Performed at [facility], [address] [place], [place] [address]    I-stat hCG, quantitative 07/11/2022 <5.0  <5 mIU/mL Final   Comment 3 07/11/2022          Final   Comment:   GEST. AGE      CONC.  (mIU/mL)   <=1 WEEK        5 - 50     2 WEEKS       50 - 500     3 WEEKS       100 - 10,000     4 WEEKS     1,000 - 30,000        FEMALE AND NON-PREGNANT FEMALE:     LESS THAN 5 mIU/mL    Troponin I (High Sensitivity) 07/11/2022 2  <18 ng/L Final   Comment: (NOTE) Elevated high sensitivity troponin I (hsTnI) values and significant  changes across serial measurements may suggest ACS but many other  chronic and acute conditions are known to elevate hsTnI results.  Refer to the "Links" section for chest pain algorithms and additional  guidance. Performed at [facility], [address]  [place], [place] [address]   Admission on 07/02/2022, Discharged on 07/02/2022  Component Date Value Ref Range Status   Color, Urine 07/02/2022 YELLOW  YELLOW Final   APPearance 07/02/2022 HAZY (A)  CLEAR Final   Specific Gravity, Urine 07/02/2022 1.014  1.005 - 1.030 Final   pH 07/02/2022 5.0  5.0 - 8.0 Final   Glucose, UA 07/02/2022 NEGATIVE  NEGATIVE mg/dL Final   Hgb urine dipstick 07/02/2022 SMALL (A)  NEGATIVE Final   Bilirubin Urine 07/02/2022 NEGATIVE  NEGATIVE Final   Ketones, ur 07/02/2022 NEGATIVE  NEGATIVE mg/dL Final   Protein, ur 09/81/191411/29/2023 NEGATIVE  NEGATIVE mg/dL Final   Nitrite 78/29/562111/29/2023 POSITIVE (A)  NEGATIVE Final   Leukocytes,Ua 07/02/2022 LARGE (A)  NEGATIVE Final   RBC / HPF 07/02/2022 6-10  0 - 5 RBC/hpf Final   WBC, UA 07/02/2022 >50 (H)  0 - 5 WBC/hpf Final   Bacteria, UA 07/02/2022 FEW (A)  NONE SEEN Final   Squamous Epithelial / HPF 07/02/2022 6-10  0 -  5 Final   Mucus 07/02/2022 PRESENT   Final   Non Squamous Epithelial 07/02/2022 0-5 (A)  NONE SEEN Final   Performed at [facility], [address] [place], [place] [address]   WBC 07/02/2022 9.9  4.0 - 10.5 K/uL Final   RBC 07/02/2022 5.18 (H)  3.87 - 5.11 MIL/uL Final   Hemoglobin 07/02/2022 15.8 (H)  12.0 - 15.0 g/dL Final   HCT 29/93/7169 48.5 (H)  36.0 - 46.0 % Final   MCV 07/02/2022 93.6  80.0 - 100.0 fL Final   MCH 07/02/2022 30.5  26.0 - 34.0 pg Final   MCHC 07/02/2022 32.6  30.0 - 36.0 g/dL Final   RDW 67/89/3810 13.5  11.5 - 15.5 % Final   Platelets 07/02/2022 249  150 - 400 K/uL Final   nRBC 07/02/2022 0.0  0.0 - 0.2 % Final   Performed at [facility] [address], [place], [place] [address]   Sodium 07/02/2022 138  135 - 145 mmol/L Final   Potassium 07/02/2022 3.9  3.5 - 5.1 mmol/L Final   Chloride 07/02/2022 105  98 - 111 mmol/L Final   CO2 07/02/2022 26  22 - 32 mmol/L Final   Glucose, Bld 07/02/2022 114 (H)  70 - 99 mg/dL Final   Glucose reference  range applies only to samples taken after fasting for at least 8 hours.   BUN 07/02/2022 9  6 - 20 mg/dL Final   Creatinine, Ser 07/02/2022 0.64  0.44 - 1.00 mg/dL Final   Calcium 25/85/2778 8.9  8.9 - 10.3 mg/dL Final   GFR, Estimated 07/02/2022 >60  >60 mL/min Final   Comment: (NOTE) Calculated using the CKD-EPI Creatinine Equation (2021)    Anion gap 07/02/2022 7  5 - 15 Final   Performed at [facility], [address] [place], [place] [address]   Lipase 07/02/2022 35  11 - 51 U/L Final   Performed at [facility], [address], [place], [place] [address]   Total Protein 07/02/2022 6.9  6.5 - 8.1 g/dL Final   Albumin 31/54/0086 3.8  3.5 - 5.0 g/dL Final   AST 76/19/5093 17  15 - 41 U/L Final   ALT 07/02/2022 16  0 - 44 U/L Final   Alkaline Phosphatase 07/02/2022 91  38 - 126 U/L Final   Total Bilirubin 07/02/2022 0.9  0.3 - 1.2 mg/dL Final   Bilirubin, Direct 07/02/2022 0.1  0.0 - 0.2 mg/dL Final   Indirect Bilirubin 07/02/2022 0.8  0.3 - 0.9 mg/dL Final   Performed at [facility], [address] [place], [place] [address]  Admission on 06/06/2022, Discharged on 06/06/2022  Component Date Value Ref Range Status   Sodium 06/06/2022 140  135 - 145 mmol/L Final   Potassium 06/06/2022 3.2 (L)  3.5 - 5.1 mmol/L Final   Chloride 06/06/2022 103  98 - 111 mmol/L Final   CO2 06/06/2022 26  22 - 32 mmol/L Final   Glucose, Bld 06/06/2022 97  70 - 99 mg/dL Final   Glucose reference range applies only to samples taken after fasting for at least 8 hours.   BUN 06/06/2022 6  6 - 20 mg/dL Final   Creatinine, Ser 06/06/2022 0.54  0.44 - 1.00 mg/dL Final   Calcium 45/80/9983 9.0  8.9 - 10.3 mg/dL Final   Total Protein 38/25/0539 6.5  6.5 - 8.1 g/dL Final   Albumin 76/73/4193 3.6  3.5 - 5.0 g/dL  Final   AST 06/06/2022 20  15 - 41 U/L Final   ALT 06/06/2022 17  0 - 44 U/L Final   Alkaline Phosphatase 06/06/2022 111  38 - 126 U/L Final    Total Bilirubin 06/06/2022 0.3  0.3 - 1.2 mg/dL Final   GFR, Estimated 06/06/2022 >60  >60 mL/min Final   Comment: (NOTE) Calculated using the CKD-EPI Creatinine Equation (2021)    Anion gap 06/06/2022 11  5 - 15 Final   Performed at [facility], [place] [address] [place], [place] [address]   Alcohol, Ethyl (B) 06/06/2022 18 (H)  <10 mg/dL Final   Comment: (NOTE) Lowest detectable limit for serum alcohol is 10 mg/dL.  For medical purposes only. Performed at [facility], [place] [address], [place], [place] [address]    WBC 06/06/2022 6.0  4.0 - 10.5 K/uL Final   RBC 06/06/2022 4.76  3.87 - 5.11 MIL/uL Final   Hemoglobin 06/06/2022 14.5  12.0 - 15.0 g/dL Final   HCT 06/06/2022 43.9  36.0 - 46.0 % Final   MCV 06/06/2022 92.2  80.0 - 100.0 fL Final   MCH 06/06/2022 30.5  26.0 - 34.0 pg Final   MCHC 06/06/2022 33.0  30.0 - 36.0 g/dL Final   RDW 06/06/2022 13.5  11.5 - 15.5 % Final   Platelets 06/06/2022 224  150 - 400 K/uL Final   nRBC 06/06/2022 0.0  0.0 - 0.2 % Final   Performed at [facility], [place] [address], [place], [place] [address]   I-stat hCG, quantitative 06/06/2022 <5.0  <5 mIU/mL Final   Comment 3 06/06/2022          Final   Comment:   GEST. AGE      CONC.  (mIU/mL)   <=1 WEEK        5 - 50     2 WEEKS       50 - 500     3 WEEKS       100 - 10,000     4 WEEKS     1,000 - 30,000        FEMALE AND NON-PREGNANT FEMALE:     LESS THAN 5 mIU/mL   Admission on 06/05/2022, Discharged on 06/05/2022  Component Date Value Ref Range Status   Sodium 06/05/2022 139  135 - 145 mmol/L Final   Potassium 06/05/2022 3.7  3.5 - 5.1 mmol/L Final   Chloride 06/05/2022 105  98 - 111 mmol/L Final   CO2 06/05/2022 26  22 - 32 mmol/L Final   Glucose, Bld 06/05/2022 114 (H)  70 - 99 mg/dL Final   Glucose reference range applies only to samples taken after fasting for at least 8 hours.   BUN 06/05/2022 8  6 - 20 mg/dL Final   Creatinine, Ser 06/05/2022 0.50  0.44 - 1.00  mg/dL Final   Calcium 06/05/2022 8.8 (L)  8.9 - 10.3 mg/dL Final   Total Protein 06/05/2022 7.1  6.5 - 8.1 g/dL Final   Albumin 06/05/2022 3.9  3.5 - 5.0 g/dL Final   AST 06/05/2022 20  15 - 41 U/L Final   ALT 06/05/2022 17  0 - 44 U/L Final   Alkaline Phosphatase 06/05/2022 120  38 - 126 U/L Final   Total Bilirubin 06/05/2022 0.5  0.3 - 1.2 mg/dL Final   GFR, Estimated 06/05/2022 >60  >60 mL/min Final   Comment: (NOTE) Calculated using the CKD-EPI Creatinine Equation (2021)  Anion gap 06/05/2022 8  5 - 15 Final   Performed at [facility], [place] [address] [place], [place] [address]   WBC 06/05/2022 4.9  4.0 - 10.5 K/uL Final   RBC 06/05/2022 5.00  3.87 - 5.11 MIL/uL Final   Hemoglobin 06/05/2022 15.1 (H)  12.0 - 15.0 g/dL Final   HCT 06/05/2022 46.0  36.0 - 46.0 % Final   MCV 06/05/2022 92.0  80.0 - 100.0 fL Final   MCH 06/05/2022 30.2  26.0 - 34.0 pg Final   MCHC 06/05/2022 32.8  30.0 - 36.0 g/dL Final   RDW 06/05/2022 13.6  11.5 - 15.5 % Final   Platelets 06/05/2022 248  150 - 400 K/uL Final   nRBC 06/05/2022 0.0  0.0 - 0.2 % Final   Performed at [facility], [place] [address] [place], [place] [address]   Opiates 06/05/2022 NONE DETECTED  NONE DETECTED Final   Cocaine 06/05/2022 NONE DETECTED  NONE DETECTED Final   Benzodiazepines 06/05/2022 NONE DETECTED  NONE DETECTED Final   Amphetamines 06/05/2022 POSITIVE (A)  NONE DETECTED Final   Tetrahydrocannabinol 06/05/2022 NONE DETECTED  NONE DETECTED Final   Barbiturates 06/05/2022 NONE DETECTED  NONE DETECTED Final   Comment: (NOTE) DRUG SCREEN FOR MEDICAL PURPOSES ONLY.  IF CONFIRMATION IS NEEDED FOR ANY PURPOSE, NOTIFY LAB WITHIN 5 DAYS.  LOWEST DETECTABLE LIMITS FOR URINE DRUG SCREEN Drug Class                     Cutoff (ng/mL) Amphetamine and metabolites    1000 Barbiturate and metabolites    200 Benzodiazepine                 200 Opiates and metabolites        300 Cocaine and  metabolites        300 THC                            50 Performed at [facility], [place] [address], [place], [place] [address]    Alcohol, Ethyl (B) 06/05/2022 <10  <10 mg/dL Final   Comment: (NOTE) Lowest detectable limit for serum alcohol is 10 mg/dL.  For medical purposes only. Performed at [facility], [place] [address], [place], [place] [address]   Admission on 05/10/2022, Discharged on 05/10/2022  Component Date Value Ref Range Status   Opiates 05/10/2022 NONE DETECTED  NONE DETECTED Final   Cocaine 05/10/2022 POSITIVE (A)  NONE DETECTED Final   Benzodiazepines 05/10/2022 NONE DETECTED  NONE DETECTED Final   Amphetamines 05/10/2022 NONE DETECTED  NONE DETECTED Final   Tetrahydrocannabinol 05/10/2022 NONE DETECTED  NONE DETECTED Final   Barbiturates 05/10/2022 NONE DETECTED  NONE DETECTED Final   Comment: (NOTE) DRUG SCREEN FOR MEDICAL PURPOSES ONLY.  IF CONFIRMATION IS NEEDED FOR ANY PURPOSE, NOTIFY LAB WITHIN 5 DAYS.  LOWEST DETECTABLE LIMITS FOR URINE DRUG SCREEN Drug Class                     Cutoff (ng/mL) Amphetamine and metabolites    1000 Barbiturate and metabolites    200 Benzodiazepine                 127 Tricyclics and metabolites     300 Opiates and metabolites        300 Cocaine and metabolites        300 THC  50 Performed at [facility], [address] [place], [place] [address]    Alcohol, Ethyl (B) 05/10/2022 <10  <10 mg/dL Final   Comment: (NOTE) Lowest detectable limit for serum alcohol is 10 mg/dL.  For medical purposes only. Performed at [facility], [address], [place], [place] [address]    Sodium 05/10/2022 137  135 - 145 mmol/L Final   Potassium 05/10/2022 4.2  3.5 - 5.1 mmol/L Final   Chloride 05/10/2022 102  98 - 111 mmol/L Final   CO2 05/10/2022 29  22 - 32 mmol/L Final   Glucose, Bld 05/10/2022 90  70 - 99 mg/dL Final   Glucose  reference range applies only to samples taken after fasting for at least 8 hours.   BUN 05/10/2022 11  6 - 20 mg/dL Final   Creatinine, Ser 05/10/2022 0.57  0.44 - 1.00 mg/dL Final   Calcium 09/81/1914 8.7 (L)  8.9 - 10.3 mg/dL Final   Total Protein 78/29/5621 7.2  6.5 - 8.1 g/dL Final   Albumin 30/86/5784 3.7  3.5 - 5.0 g/dL Final   AST 69/62/9528 18  15 - 41 U/L Final   ALT 05/10/2022 16  0 - 44 U/L Final   Alkaline Phosphatase 05/10/2022 97  38 - 126 U/L Final   Total Bilirubin 05/10/2022 0.5  0.3 - 1.2 mg/dL Final   GFR, Estimated 05/10/2022 >60  >60 mL/min Final   Comment: (NOTE) Calculated using the CKD-EPI Creatinine Equation (2021)    Anion gap 05/10/2022 6  5 - 15 Final   Performed at [facility] [place], [address] [place], [place] [address]   WBC 05/10/2022 6.4  4.0 - 10.5 K/uL Final   RBC 05/10/2022 5.56 (H)  3.87 - 5.11 MIL/uL Final   Hemoglobin 05/10/2022 16.3 (H)  12.0 - 15.0 g/dL Final   HCT 40/05/2724 50.7 (H)  36.0 - 46.0 % Final   MCV 05/10/2022 91.2  80.0 - 100.0 fL Final   MCH 05/10/2022 29.3  26.0 - 34.0 pg Final   MCHC 05/10/2022 32.1  30.0 - 36.0 g/dL Final   RDW 36/64/4034 13.2  11.5 - 15.5 % Final   Platelets 05/10/2022 250  150 - 400 K/uL Final   nRBC 05/10/2022 0.0  0.0 - 0.2 % Final   Neutrophils Relative % 05/10/2022 49  % Final   Neutro Abs 05/10/2022 3.2  1.7 - 7.7 K/uL Final   Lymphocytes Relative 05/10/2022 40  % Final   Lymphs Abs 05/10/2022 2.6  0.7 - 4.0 K/uL Final   Monocytes Relative 05/10/2022 8  % Final   Monocytes Absolute 05/10/2022 0.5  0.1 - 1.0 K/uL Final   Eosinophils Relative 05/10/2022 1  % Final   Eosinophils Absolute 05/10/2022 0.0  0.0 - 0.5 K/uL Final   Basophils Relative 05/10/2022 1  % Final   Basophils Absolute 05/10/2022 0.0  0.0 - 0.1 K/uL Final   Immature Granulocytes 05/10/2022 1  % Final   Abs Immature Granulocytes 05/10/2022 0.04  0.00 - 0.07 K/uL Final   Performed at [facility],  [address] [place], [place] [address]   Lipase 05/10/2022 34  11 - 51 U/L Final   Performed at [facility], [address], [place], [place] [address]  Admission on 05/08/2022, Discharged on 05/09/2022  Component Date Value Ref Range Status   SARS Coronavirus 2 by RT PCR 05/08/2022 NEGATIVE  NEGATIVE Final   Comment: (NOTE) SARS-CoV-2 target  nucleic acids are NOT DETECTED.  The SARS-CoV-2 RNA is generally detectable in upper respiratory specimens during the acute phase of infection. The lowest concentration of SARS-CoV-2 viral copies this assay can detect is 138 copies/mL. A negative result does not preclude SARS-Cov-2 infection and should not be used as the sole basis for treatment or other patient management decisions. A negative result may occur with  improper specimen collection/handling, submission of specimen other than nasopharyngeal swab, presence of viral mutation(s) within the areas targeted by this assay, and inadequate number of viral copies(<138 copies/mL). A negative result must be combined with clinical observations, patient history, and epidemiological information. The expected result is Negative.  Fact Sheet for Patients:  BloggerCourse.com  Fact Sheet for Healthcare Providers:  SeriousBroker.it  This test is no                          t yet approved or cleared by the [place] FDA and  has been authorized for detection and/or diagnosis of SARS-CoV-2 by FDA under an Emergency Use Authorization (EUA). This EUA will remain  in effect (meaning this test can be used) for the duration of the COVID-19 declaration under Section 564(b)(1) of the Act, 21 U.S.C.section 360bbb-3(b)(1), unless the authorization is terminated  or revoked sooner.       Influenza A by PCR 05/08/2022 NEGATIVE  NEGATIVE Final   Influenza B by PCR 05/08/2022 NEGATIVE  NEGATIVE Final   Comment: (NOTE) The Xpert  Xpress SARS-CoV-2/FLU/RSV plus assay is intended as an aid in the diagnosis of influenza from Nasopharyngeal swab specimens and should not be used as a sole basis for treatment. Nasal washings and aspirates are unacceptable for Xpert Xpress SARS-CoV-2/FLU/RSV testing.  Fact Sheet for Patients: BloggerCourse.com  Fact Sheet for Healthcare Providers: SeriousBroker.it  This test is not yet approved or cleared by the [place] FDA and has been authorized for detection and/or diagnosis of SARS-CoV-2 by FDA under an Emergency Use Authorization (EUA). This EUA will remain in effect (meaning this test can be used) for the duration of the COVID-19 declaration under Section 564(b)(1) of the Act, 21 U.S.C. section 360bbb-3(b)(1), unless the authorization is terminated or revoked.  Performed at [facility], [address] [place], [place] [address]    WBC 05/08/2022 6.5  4.0 - 10.5 K/uL Final   RBC 05/08/2022 5.36 (H)  3.87 - 5.11 MIL/uL Final   Hemoglobin 05/08/2022 16.1 (H)  12.0 - 15.0 g/dL Final   HCT 19/14/7829 47.9 (H)  36.0 - 46.0 % Final   MCV 05/08/2022 89.4  80.0 - 100.0 fL Final   MCH 05/08/2022 30.0  26.0 - 34.0 pg Final   MCHC 05/08/2022 33.6  30.0 - 36.0 g/dL Final   RDW 56/21/3086 13.2  11.5 - 15.5 % Final   Platelets 05/08/2022 237  150 - 400 K/uL Final   nRBC 05/08/2022 0.0  0.0 - 0.2 % Final   Neutrophils Relative % 05/08/2022 37  % Final   Neutro Abs 05/08/2022 2.4  1.7 - 7.7 K/uL Final   Lymphocytes Relative 05/08/2022 52  % Final   Lymphs Abs 05/08/2022 3.4  0.7 - 4.0 K/uL Final   Monocytes Relative 05/08/2022 8  % Final   Monocytes Absolute 05/08/2022 0.5  0.1 - 1.0 K/uL Final   Eosinophils Relative 05/08/2022 1  % Final   Eosinophils Absolute 05/08/2022 0.1  0.0 - 0.5 K/uL Final   Basophils Relative 05/08/2022 1  %  Final   Basophils Absolute 05/08/2022 0.0  0.0 - 0.1 K/uL Final   Immature Granulocytes  05/08/2022 1  % Final   Abs Immature Granulocytes 05/08/2022 0.05  0.00 - 0.07 K/uL Final   Performed at [facility], [place] [address], [place], [place] [address]   Sodium 05/08/2022 135  135 - 145 mmol/L Final   Potassium 05/08/2022 3.9  3.5 - 5.1 mmol/L Final   Chloride 05/08/2022 100  98 - 111 mmol/L Final   CO2 05/08/2022 27  22 - 32 mmol/L Final   Glucose, Bld 05/08/2022 93  70 - 99 mg/dL Final   Glucose reference range applies only to samples taken after fasting for at least 8 hours.   BUN 05/08/2022 8  6 - 20 mg/dL Final   Creatinine, Ser 05/08/2022 0.68  0.44 - 1.00 mg/dL Final   Calcium 05/08/2022 9.0  8.9 - 10.3 mg/dL Final   Total Protein 05/08/2022 6.6  6.5 - 8.1 g/dL Final   Albumin 05/08/2022 3.7  3.5 - 5.0 g/dL Final   AST 05/08/2022 17  15 - 41 U/L Final   ALT 05/08/2022 17  0 - 44 U/L Final   Alkaline Phosphatase 05/08/2022 111  38 - 126 U/L Final   Total Bilirubin 05/08/2022 0.6  0.3 - 1.2 mg/dL Final   GFR, Estimated 05/08/2022 >60  >60 mL/min Final   Comment: (NOTE) Calculated using the CKD-EPI Creatinine Equation (2021)    Anion gap 05/08/2022 8  5 - 15 Final   Performed at [place] [address], [place], [place] [address]   Hgb A1c MFr Bld 05/08/2022 4.9  4.8 - 5.6 % Final   Comment: (NOTE) Pre diabetes:          5.7%-6.4%  Diabetes:              >6.4%  Glycemic control for   <7.0% adults with diabetes    Mean Plasma Glucose 05/08/2022 93.93  mg/dL Final   Performed at [facility], [place] [address] [place], [place] [address]   Preg Test, Ur 05/08/2022 NEGATIVE  NEGATIVE Final   Performed at [facility], [place] [address] [place], [place] [address]   POC Amphetamine UR 05/08/2022 None Detected  NONE DETECTED (Cut Off Level 1000 ng/mL) Final   POC Secobarbital (BAR) 05/08/2022 None Detected  NONE DETECTED (Cut Off Level 300 ng/mL) Final   POC Buprenorphine (BUP) 05/08/2022 None Detected  NONE DETECTED (Cut Off Level 10 ng/mL)  Final   POC Oxazepam (BZO) 05/08/2022 None Detected  NONE DETECTED (Cut Off Level 300 ng/mL) Final   POC Cocaine UR 05/08/2022 None Detected  NONE DETECTED (Cut Off Level 300 ng/mL) Final   POC Methamphetamine UR 05/08/2022 Positive (A)  NONE DETECTED (Cut Off Level 1000 ng/mL) Final   POC Morphine 05/08/2022 None Detected  NONE DETECTED (Cut Off Level 300 ng/mL) Final   POC Methadone UR 05/08/2022 None Detected  NONE DETECTED (Cut Off Level 300 ng/mL) Final   POC Oxycodone UR 05/08/2022 None Detected  NONE DETECTED (Cut Off Level 100 ng/mL) Final   POC Marijuana UR 05/08/2022 None Detected  NONE DETECTED (Cut Off Level 50 ng/mL) Final   SARSCOV2ONAVIRUS 2 AG 05/08/2022 NEGATIVE  NEGATIVE Final   Comment: (NOTE) SARS-CoV-2 antigen NOT DETECTED.   Negative results are presumptive.  Negative results do not preclude SARS-CoV-2 infection and should not be used as the sole basis for treatment or other patient management decisions,  including infection  control decisions, particularly in the presence of clinical signs and  symptoms consistent with COVID-19, or in those who have been in contact with the virus.  Negative results must be combined with clinical observations, patient history, and epidemiological information. The expected result is Negative.  Fact Sheet for Patients: https://www.jennings-kim.com/https://www.fda.gov/media/141569/download  Fact Sheet for Healthcare Providers: https://alexander-rogers.biz/https://www.fda.gov/media/141568/download  This test is not yet approved or cleared by the [place] FDA and  has been authorized for detection and/or diagnosis of SARS-CoV-2 by FDA under an Emergency Use Authorization (EUA).  This EUA will remain in effect (meaning this test can be used) for the duration of  the COV                          ID-19 declaration under Section 564(b)(1) of the Act, 21 U.S.C. section 360bbb-3(b)(1), unless the authorization is terminated or revoked sooner.     Preg Test, Ur 05/08/2022 NEGATIVE  NEGATIVE  Final   Comment:        THE SENSITIVITY OF THIS METHODOLOGY IS >24 mIU/mL    Cholesterol 05/08/2022 187  0 - 200 mg/dL Final   Triglycerides 11/91/478210/12/2021 117  <150 mg/dL Final   HDL 95/62/130810/12/2021 67  >40 mg/dL Final   Total CHOL/HDL Ratio 05/08/2022 2.8  RATIO Final   VLDL 05/08/2022 23  0 - 40 mg/dL Final   LDL Cholesterol 05/08/2022 97  0 - 99 mg/dL Final   Comment:        Total Cholesterol/HDL:CHD Risk Coronary Heart Disease Risk Table                     Men   Women  1/2 Average Risk   3.4   3.3  Average Risk       5.0   4.4  2 X Average Risk   9.6   7.1  3 X Average Risk  23.4   11.0        Use the calculated Patient Ratio above and the CHD Risk Table to determine the patient's CHD Risk.        ATP III CLASSIFICATION (LDL):  <100     mg/dL   Optimal  [phone]  mg/dL   Near or Above                    Optimal  130-159  mg/dL   Borderline  [phone]  mg/dL   High  >841>190     mg/dL   Very High Performed at [facility], [address] [place], [place] [address]    TSH 05/08/2022 3.264  0.350 - 4.500 uIU/mL Final   Comment: Performed by a 3rd Generation assay with a functional sensitivity of <=0.01 uIU/mL. Performed at [facility], [address] [place], [place] [address]   There may be more visits with results that are not included.    Blood Alcohol level:  Lab Results  Component Value Date   ETH <10 08/20/2022   ETH 37 (H) 07/17/2022    Metabolic Disorder Labs: Lab Results  Component Value Date   HGBA1C 5.4 08/20/2022   MPG 108.28 08/20/2022   MPG 93.93 05/08/2022   No results found for: "PROLACTIN" Lab Results  Component Value Date   CHOL 180 08/20/2022   TRIG 181 (H) 08/20/2022   HDL 77 08/20/2022   CHOLHDL 2.3 08/20/2022   VLDL 36 08/20/2022   LDLCALC 67 08/20/2022  LDLCALC 97 05/08/2022    Therapeutic Lab Levels: No results found for: "LITHIUM" No results found for: "VALPROATE" No results found for: "CBMZ"  Physical Findings    AIMS    Flowsheet Row ED to [facility]osp[facility] ([facility]  AIMS Total Score 0      AUDIT    Flowsheet Row ED to Hosp-Admission (Discharged) [facility]  Alcohol Use Disorder Identification Test Final Score (AUDIT) 32      CAGE-AID    Flowsheet Row ED from 01/17/2022 in [facility] Department at [facility]  CAGE-AID Score 1      GAD-7    Flowsheet Row Office Visit from 03/12/2021 in [facility]  Total GAD-7 Score 20      PHQ2-9    Flowsheet Row ED [facility]20[facility]20[facility] from [facility]/17/2022 in [facility]12/[facility]  PHQ-2 Total Score PHQ-9 Total Score Flowsheet Row ED from 08/20/2022 in [facility] ED from 07/23/2022 in [facility] ED from 07/22/2022 in [facility] Department at [facility]  C-SSRS RISK CATEGORY No Risk Low Risk No Risk      Psychiatric Specialty Exam: Physical Exam Constitutional:      Appearance: the patient is not toxic-appearing.  Pulmonary:     Effort: Pulmonary effort is normal.  Neurological:     General: No focal deficit present.     Mental Status: the patient is alert and oriented to person, place, and time.   Review of Systems  Respiratory:  Negative for shortness of breath.   Cardiovascular:  Negative for chest pain.  Gastrointestinal:  Negative for abdominal pain, constipation, diarrhea, nausea and vomiting.  Neurological:  Negative for headaches.      BP 112/84 (BP Location: Right Arm)   Pulse 75   Temp 98.1 F (36.7 C) (Tympanic)   Resp 18   Ht  (1.6 m)   Wt 110 lb (49.9 kg)   SpO2 100%   BMI 19.49 kg/m   General  Appearance: disheveled  Eye Contact:  Good  Speech:  Clear and Coherent  Volume:  Normal  Mood:  Euthymic  Affect:  Congruent  Thought Process:  Coherent  Orientation:  Full (Time, Place, and Person)  Thought Content: Logical   Suicidal Thoughts:  No  Homicidal Thoughts:  No  Memory:  Immediate;   Good  Judgement:  fair  Insight:  fair  Psychomotor Activity:  Normal  Concentration:  Concentration: Good  Recall:  Good  Fund of Knowledge: Good  Language: Good  Akathisia:  No  Handed:    AIMS (if indicated): not done  Assets:  Communication Skills Desire for Improvement Leisure Time Physical Health  ADL's:  Intact  Cognition: WNL  Sleep:  Fair     Treatment Plan Summary: Daily contact with patient to assess and evaluate symptoms and progress in treatment and Medication management  Status: Voluntary, no acute safety concerns  Methamphetamine use disorder, alcohol use disorder - Symptomatic management of withdrawal symptoms from methamphetamine - Librium taper due to concern for alcohol withdrawal - Pursue residential rehab placement  Continue home medications as ordered: - Abilify 10 mg  daily - Lexapro 10 mg daily - 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  She had a large PE previously and failed apixaban.  Note from Dr. Myna Hidalgo, oncologist, from 2022 states "she really needs to be on lifelong anticoagulation at this point".  At that time she was anticoagulated with warfarin but seems to switched to Xarelto more recently.  Given this information we will continue Xarelto indefinitely until she can follow-up with primary medical team.  Medical: - Urine pregnancy test negative - Labs otherwise unremarkable   Carlyn Reichert, MD 08/26/2022 1:55 PM

## 2022-08-26 NOTE — ED Notes (Signed)
Pt is in the bed sleeping. Respirations are even and unlabored. No acute distress noted. Will continue to monitor for safety. 

## 2022-08-26 NOTE — ED Notes (Signed)
Patient A&Ox4. Denies intent to harm self/others when asked. Denies A/VH. Patient denies any physical complaints when asked. No acute distress noted. Pt reports sleeping "good" last night. Bkft eaten. Routine safety checks conducted according to facility protocol. Encouraged patient to notify staff if thoughts of harm toward self or others arise. Patient verbalize understanding and agreement. Will continue to monitor for safety.

## 2022-08-26 NOTE — ED Notes (Signed)
Patient refused to attend group.

## 2022-08-26 NOTE — ED Notes (Signed)
Pt sleeping in no acute distress. RR even and unlabored. Environment secured. Will continue to monitor for safety. 

## 2022-08-27 DIAGNOSIS — F1910 Other psychoactive substance abuse, uncomplicated: Secondary | ICD-10-CM | POA: Diagnosis not present

## 2022-08-27 DIAGNOSIS — Z5900 Homelessness unspecified: Secondary | ICD-10-CM | POA: Diagnosis not present

## 2022-08-27 DIAGNOSIS — F1094 Alcohol use, unspecified with alcohol-induced mood disorder: Secondary | ICD-10-CM | POA: Diagnosis not present

## 2022-08-27 DIAGNOSIS — Z1152 Encounter for screening for COVID-19: Secondary | ICD-10-CM | POA: Diagnosis not present

## 2022-08-27 MED ORDER — HYDROXYZINE HCL 10 MG PO TABS: 10.0000 mg | ORAL_TABLET | Freq: Three times a day (TID) | ORAL | 0 refills | Status: AC

## 2022-08-27 MED ORDER — RIVAROXABAN 20 MG PO TABS: 20.0000 mg | ORAL_TABLET | Freq: Every day | ORAL | 0 refills | Status: AC

## 2022-08-27 MED ORDER — ARIPIPRAZOLE 10 MG PO TABS: 10.0000 mg | ORAL_TABLET | Freq: Every day | ORAL | 0 refills | Status: AC

## 2022-08-27 MED ORDER — FLUTICASONE PROPIONATE 50 MCG/ACT NA SUSP: 1.0000 | Freq: Every day | NASAL | 0 refills | Status: AC

## 2022-08-27 MED ORDER — TRAZODONE HCL 50 MG PO TABS: 50.0000 mg | ORAL_TABLET | ORAL | 0 refills | Status: AC | PRN

## 2022-08-27 MED ORDER — ESCITALOPRAM OXALATE 10 MG PO TABS: 10.0000 mg | ORAL_TABLET | Freq: Every day | ORAL | 0 refills | Status: AC

## 2022-08-27 NOTE — ED Notes (Signed)
Pt is in the bed sleeping. Respirations are even and unlabored. No acute distress noted. Will continue to monitor for safety. 

## 2022-08-27 NOTE — ED Notes (Signed)
Patient is awake and alert this morning sitting in day room eating breakfast and watching the morning news.  Patient is calm, cooperative and without distress.  She makes needs know and is pleasant on approach.  Patient is pending for discharge on Thursday.  Will monitor and provide a safe environment.

## 2022-08-27 NOTE — ED Notes (Signed)
Pt resting in bed. A&O x4, calm and cooperative. Denies current SI/HI/AVH. No signs of distress noted. Monitoring for safety. 

## 2022-08-27 NOTE — Progress Notes (Signed)
Patient is attending a recovery group at this time.  Appears attentive.

## 2022-08-27 NOTE — ED Notes (Signed)
Pt asleep in bed. Respirations even and unlabored. Monitoring for safety. 
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
## 2022-08-27 NOTE — ED Notes (Signed)
Pt refused AA tonight. She is sleeping in her room.

## 2022-08-27 NOTE — ED Notes (Signed)
Patient has been awake and alert on unit.   She has plans for discharge tomorrow morning as she ws accepted to a 12 month treatment program.  Patients sister is supplying an uber for her which will be here at 8am.  Patient is excited about the program and is motivated for change.  Patient has made needs known, has been appropriate, calm and pleasant on approach.  Will monitor and provide support as needed.

## 2022-08-27 NOTE — [facility] Group Notes (Signed)
[facility] LCSW Group Therapy  Date/ Time: 08/27/2022 at 2:15PM  Type of Therapy:  Group Therapy  Participation Level:  Active  Participation Quality:  Appropriate  Affect:  Appropriate  Cognitive:  Appropriate  Insight:  Developing/Improving  Engagement in Therapy:  Developing/Improving  Modes of Intervention:  Activity, Discussion, Rapport Building, Socialization and Support  Summary of Progress/Problems: Patient actively participated in group on today. Group started off with introductions and group rules. Group members participated in a therapeutic activity that required active listening and communication skills. Group members were able to identify similarities and differences within the group. Patient interacted positively with staff and peers. No issues to report.    Kiani Wurtzel, LCSW Clinical Social Worker [place] [facility] Ph: [phone] 

## 2022-08-27 NOTE — ED Provider Notes (Signed)
[facility]BS ASAP Discharge Summary  Date and Time: 08/27/2022 5:27 PM  Name: Kristina Owens  MRN:  [id number]   Discharge Diagnoses:  Final diagnoses:  Alcohol use disorder, severe, dependence (HCC)  Alcohol-induced mood disorder with depressive symptoms (HCC)  Polysubstance abuse (HCC)  Homelessness    Subjective:  The patient is a 57 year old female well-known to this service line.  She has a history of cocaine, alcohol, and methamphetamine use disorders.  She has been homeless for some time.  She has had many stays at the [facility] facility base crisis.  The last 1 was in December, when she was discharged to [facility] residential.  She reported to LCSW that she was denied because she did not have her Xarelto.  On initial interview the patient appeared to have physical symptoms, possibly of methamphetamine withdrawal (the only substance for which she was positive in her UDS).  Started on Librium taper due to concern for alcohol withdrawal.     Stay Summary:  Uneventful detox.  Patient accepted at [facility] rehab in [place], but opted instead for rehab in [place] [facility]).  Patient had her sister arrange a ride share transportation for her to the facility.  Total Time spent with patient: 20 min  Past Psychiatric History: as above Past Medical History: per H and P Family History: none Family Psychiatric History: none Social History: per H and P Tobacco Cessation:  A prescription for an FDA-approved tobacco cessation medication provided at discharge   Current Medications:  Current Facility-Administered Medications  Medication Dose Route Frequency Provider Last Rate Last Admin   acetaminophen (TYLENOL) tablet 650 mg  650 mg Oral Q6H PRN Rankin, Shuvon B, NP   650 mg at 08/22/22 0920   alum & mag hydroxide-simeth (MAALOX/MYLANTA) 200-200-20 MG/5ML suspension 30 mL  30 mL Oral Q4H PRN Rankin, Shuvon B, NP       ARIPiprazole (ABILIFY) tablet 10 mg  10 mg Oral Daily Rankin,  Shuvon B, NP   10 mg at 08/27/22 0920   benztropine (COGENTIN) tablet 0.5 mg  0.5 mg Oral Daily McQuilla, Gerlean Ren B, MD   0.5 mg at 08/27/22 0920   escitalopram (LEXAPRO) tablet 10 mg  10 mg Oral Daily Rankin, Shuvon B, NP   10 mg at 08/27/22 0919   fluticasone (FLONASE) 50 MCG/ACT nasal spray 1 spray  1 spray Each Nare Daily Carlyn Reichert, MD   1 spray at 08/27/22 0920   hydrOXYzine (ATARAX) tablet 10 mg  10 mg Oral TID Eliseo Gum B, MD   10 mg at 08/27/22 1505   magnesium hydroxide (MILK OF MAGNESIA) suspension 30 mL  30 mL Oral Daily PRN Rankin, Shuvon B, NP       multivitamin with minerals tablet 1 tablet  1 tablet Oral Daily Rankin, Shuvon B, NP   1 tablet at 08/27/22 0920   nicotine (NICODERM CQ - dosed in mg/24 hours) patch 21 mg  21 mg Transdermal Daily Rankin, Shuvon B, NP   21 mg at 08/27/22 0919   rivaroxaban (XARELTO) tablet 20 mg  20 mg Oral Daily Rankin, Shuvon B, NP   20 mg at 08/27/22 0919   thiamine (VITAMIN B1) tablet 100 mg  100 mg Oral Daily Carlyn Reichert, MD   100 mg at 08/27/22 0919   traZODone (DESYREL) tablet 50 mg  50 mg Oral QHS PRN Rankin, Shuvon B, NP   50 mg at 08/22/22 2132   Current Outpatient Medications  Medication Sig Dispense Refill   ARIPiprazole (ABILIFY)  10 MG tablet Take 1 tablet (10 mg total) by mouth daily. 30 tablet 0   escitalopram (LEXAPRO) 10 MG tablet Take 1 tablet (10 mg total) by mouth daily. 30 tablet 0   [START ON 08/28/2022] fluticasone (FLONASE) 50 MCG/ACT nasal spray Place 1 spray into both nostrils daily. 11.1 mL 0   hydrOXYzine (ATARAX) 10 MG tablet Take 1 tablet (10 mg total) by mouth 3 (three) times daily. 30 tablet 0   [START ON 08/28/2022] rivaroxaban (XARELTO) 20 MG TABS tablet Take 1 tablet (20 mg total) by mouth daily. 30 tablet 0   traZODone (DESYREL) 50 MG tablet Take 1 tablet (50 mg total) by mouth every other day as needed for sleep. 15 tablet 0    PTA Medications:  Facility Ordered Medications  Medication   acetaminophen  (TYLENOL) tablet 650 mg   alum & mag hydroxide-simeth (MAALOX/MYLANTA) 200-200-20 MG/5ML suspension 30 mL   magnesium hydroxide (MILK OF MAGNESIA) suspension 30 mL   traZODone (DESYREL) tablet 50 mg   multivitamin with minerals tablet 1 tablet   [EXPIRED] chlordiazePOXIDE (LIBRIUM) capsule 25 mg   [EXPIRED] hydrOXYzine (ATARAX) tablet 25 mg   [EXPIRED] ondansetron (ZOFRAN-ODT) disintegrating tablet 4 mg   [COMPLETED] chlordiazePOXIDE (LIBRIUM) capsule 25 mg   Followed by   [COMPLETED] chlordiazePOXIDE (LIBRIUM) capsule 25 mg   Followed by   [COMPLETED] chlordiazePOXIDE (LIBRIUM) capsule 25 mg   Followed by   [COMPLETED] chlordiazePOXIDE (LIBRIUM) capsule 25 mg   escitalopram (LEXAPRO) tablet 10 mg   ARIPiprazole (ABILIFY) tablet 10 mg   nicotine (NICODERM CQ - dosed in mg/24 hours) patch 21 mg   rivaroxaban (XARELTO) tablet 20 mg   fluticasone (FLONASE) 50 MCG/ACT nasal spray 1 spray   [EXPIRED] loperamide (IMODIUM) capsule 2-4 mg   thiamine (VITAMIN B1) tablet 100 mg   benztropine (COGENTIN) tablet 0.5 mg   hydrOXYzine (ATARAX) tablet 10 mg   PTA Medications  Medication Sig   ARIPiprazole (ABILIFY) 10 MG tablet Take 1 tablet (10 mg total) by mouth daily.   escitalopram (LEXAPRO) 10 MG tablet Take 1 tablet (10 mg total) by mouth daily.   [START ON 08/28/2022] fluticasone (FLONASE) 50 MCG/ACT nasal spray Place 1 spray into both nostrils daily.   hydrOXYzine (ATARAX) 10 MG tablet Take 1 tablet (10 mg total) by mouth 3 (three) times daily.   [START ON 08/28/2022] rivaroxaban (XARELTO) 20 MG TABS tablet Take 1 tablet (20 mg total) by mouth daily.   traZODone (DESYREL) 50 MG tablet Take 1 tablet (50 mg total) by mouth every other day as needed for sleep.       08/24/2022   10:47 AM 08/23/2022   11:52 AM 08/21/2022   12:00 PM  Depression screen PHQ 2/9  Decreased Interest 3 3 3   Down, Depressed, Hopeless 2 3 3   PHQ - 2 Score 5 6 6   Altered sleeping 2 2 3   Tired, decreased energy 3 2  3   Change in appetite 3 2 3   Feeling bad or failure about yourself  3 3 3   Trouble concentrating 3 3 3   Moving slowly or fidgety/restless 3 2 3   Suicidal thoughts 2 2 0  PHQ-9 Score 24 22 24   Difficult doing work/chores   Very difficult    Flowsheet Row ED from 08/20/2022 in [facility] ED from 07/23/2022 in [facility] ED from 07/22/2022 in [facility] Emergency Department at [place] No Risk Low Risk No Risk  Musculoskeletal  Strength & Muscle Tone: within normal limits Gait & Station: normal Patient leans: N/A  Psychiatric Specialty Exam: Physical Exam Constitutional:      Appearance: the patient is not toxic-appearing.  Pulmonary:     Effort: Pulmonary effort is normal.  Neurological:     General: No focal deficit present.     Mental Status: the patient is alert and oriented to person, place, and time.   Review of Systems  Respiratory:  Negative for shortness of breath.   Cardiovascular:  Negative for chest pain.  Gastrointestinal:  Negative for abdominal pain, constipation, diarrhea, nausea and vomiting.  Neurological:  Negative for headaches.      BP 101/71 (BP Location: Left Arm)   Pulse 60   Temp (!) 97.4 F (36.3 C) (Oral) Comment: RN Harmeeder informed  Resp 18   Ht 5\' 3"  (1.6 m)   Wt 110 lb (49.9 kg)   SpO2 100%   BMI 19.49 kg/m   General Appearance: Fairly Groomed  Eye Contact:  Good  Speech:  Clear and Coherent  Volume:  Normal  Mood:  Euthymic  Affect:  Congruent  Thought Process:  Coherent  Orientation:  Full (Time, Place, and Person)  Thought Content: Logical   Suicidal Thoughts:  No  Homicidal Thoughts:  No  Memory:  Immediate;   Good  Judgement:  fair  Insight:  fair  Psychomotor Activity:  Normal  Concentration:  Concentration: Good  Recall:  Good  Fund of Knowledge: Good  Language: Good  Akathisia:  No  Handed:    AIMS (if indicated):  not done  Assets:  Communication Skills Desire for Improvement Financial Resources/Insurance Housing Leisure Time Physical Health  ADL's:  Intact  Cognition: WNL  Sleep:  Fair   Demographic Factors:  Low socioeconomic status   Loss Factors: NA   Historical Factors: NA   Risk Reduction Factors:   Positive social support, Positive therapeutic relationship, and Positive coping skills or problem solving skills   Continued Clinical Symptoms:  Alcohol/Substance Abuse/Dependencies   Cognitive Features That Contribute To Risk:  None     Suicide Risk:  Mild: Patient did not present with intentional self-harm behavior.  Has demonstrated good engagement in the treatment plan and future oriented thought process.  Good follow-up plan in place.   Plan Of Care/Follow-up recommendations:  Activity as tolerated. Diet as recommended by PCP. Keep all scheduled follow-up appointments as recommended.   Patient is instructed to take all prescribed medications as recommended. Report any side effects or adverse reactions to your outpatient psychiatrist. Patient is instructed to abstain from alcohol and illegal drugs while on prescription medications. In the event of worsening symptoms, patient is instructed to call the crisis hotline, 911, or go to the nearest emergency department for evaluation and treatment.   Prescriptions given at discharge. Patient agreeable to plan. Given opportunity to ask questions. Appears to feel comfortable with discharge.   Patient is also instructed prior to discharge to: Take all medications as prescribed by mental healthcare provider. Report any adverse effects and or reactions from the medicines to outpatient provider promptly. Patient has been instructed & cautioned: To not engage in alcohol and or illegal drug use while on prescription medicines. In the event of worsening symptoms,  patient is instructed to call the crisis hotline, 911 and or go to the nearest ED  for appropriate evaluation and treatment of symptoms. To follow-up with primary care provider for other medical issues, concerns and or health care needs  The patient was evaluated each day by a clinical provider to ascertain response to treatment. Improvement was noted by the patient's report of decreasing symptoms, improved sleep and appetite, affect, medication tolerance, behavior, and participation in unit programming.  Patient was asked each day to complete a self inventory noting mood, mental status, pain, new symptoms, anxiety and concerns.   Patient responded well to medication and being in a therapeutic and supportive environment. Positive and appropriate behavior was noted and the patient was motivated for recovery. The patient worked closely with the treatment team and case manager to develop a discharge plan with appropriate goals. Coping skills, problem solving as well as relaxation therapies were also part of the unit programming.   By the day of discharge patient was in much improved condition than upon admission.  Symptoms were reported as significantly decreased or resolved completely. The patient was motivated to continue taking medication with a goal of continued improvement in mental health.   Disposition: [facility] in [facility], [place] 08/27/2022, 5:27 PM

## 2022-08-28 DIAGNOSIS — F1910 Other psychoactive substance abuse, uncomplicated: Secondary | ICD-10-CM | POA: Diagnosis not present

## 2022-08-28 DIAGNOSIS — Z5900 Homelessness unspecified: Secondary | ICD-10-CM | POA: Diagnosis not present

## 2022-08-28 DIAGNOSIS — F1094 Alcohol use, unspecified with alcohol-induced mood disorder: Secondary | ICD-10-CM | POA: Diagnosis not present

## 2022-08-28 DIAGNOSIS — Z1152 Encounter for screening for COVID-19: Secondary | ICD-10-CM | POA: Diagnosis not present

## 2022-08-28 NOTE — ED Notes (Signed)
Pt asleep in bed. Respirations even and unlabored. Monitoring for safety. 

## 2023-03-07 IMAGING — CT CT VENOGRAM ABD-PELV
2 of 10 series · 11 of 46 positions shown, 17 images · IV contrast (APPLIED)
Comparison: CT abdomen/pelvis 07/05/2019

CLINICAL DATA: 54-year-old female with pulmonary emboli and acute
left lower extremity DVT. She is having persistent and worsening
left lower extremity pain and swelling despite being on
anticoagulation. Evaluate for evidence of iliac vein compression
syndrome.

EXAM:
CT VENOGRAM ABDOMEN AND PELVIS
TECHNIQUE: CT of the abdomen and pelvis obtained following administration of
intravenous contrast. Vascular timing optimized for venous phase
imaging.
CONTRAST:  100mL OMNIPAQUE IOHEXOL 350 MG/ML SOLN

[Series 4: coronals · coronal · 0.55mm/px · 2 of 138 slices shown]
[im 46/138  soft-tissue]
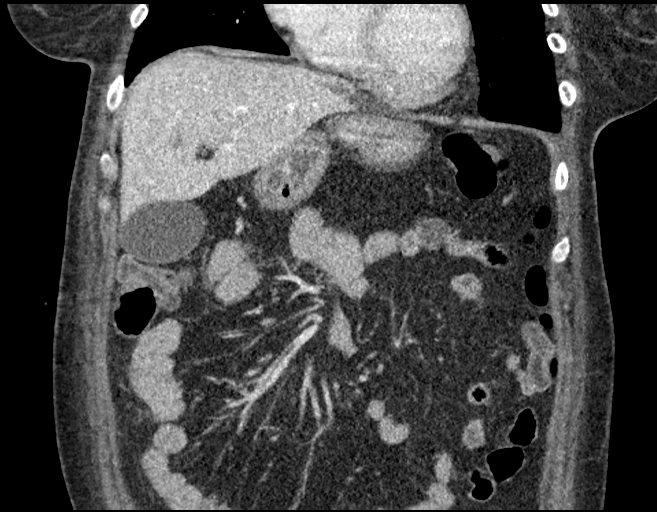
[im 92/138  soft-tissue]
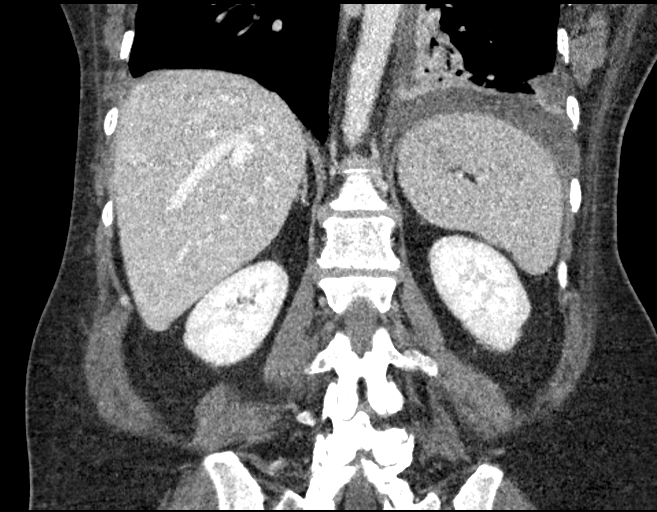

[Series 6: axial venous · axial · portal-venous · 0.73mm/px · z∈[-814,-409]mm · 9 of 103 slices shown, 15 images]
[im 11/103  soft-tissue]
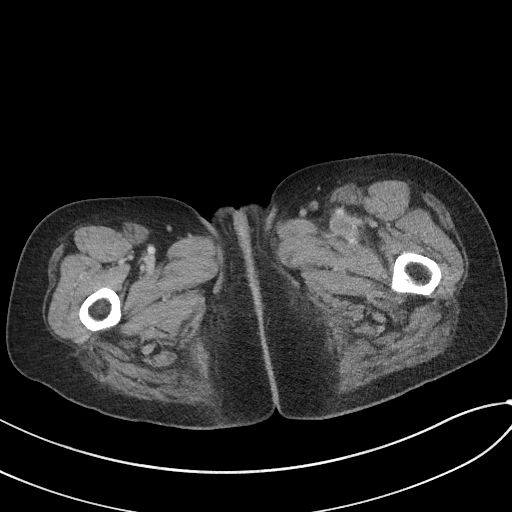
[im 11/103  bone]
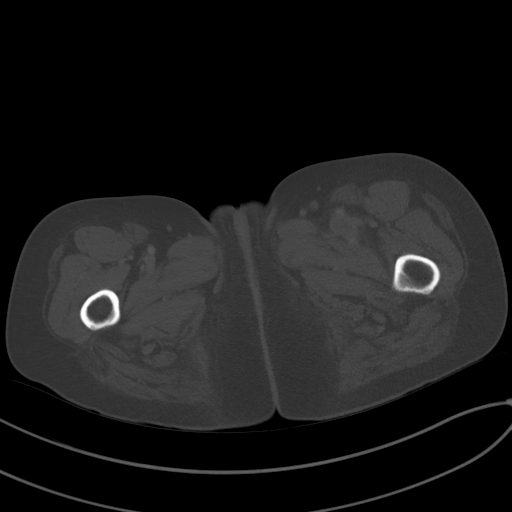
[im 21/103  soft-tissue]
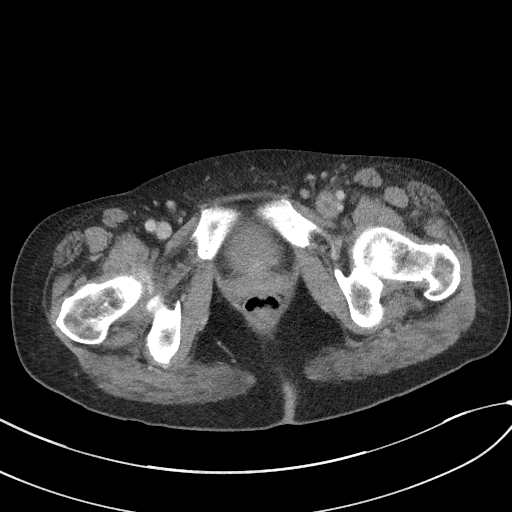
[im 31/103  soft-tissue]
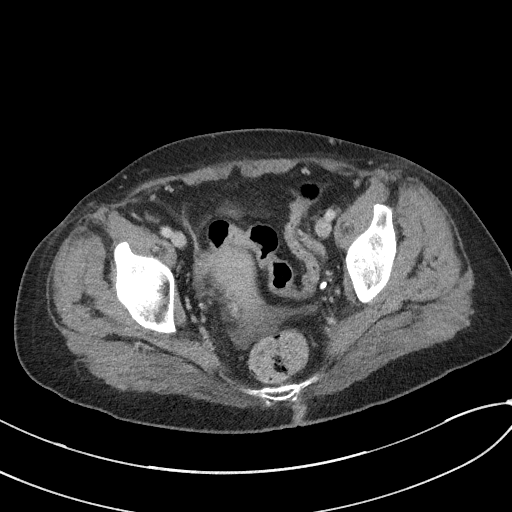
[im 41/103  soft-tissue]
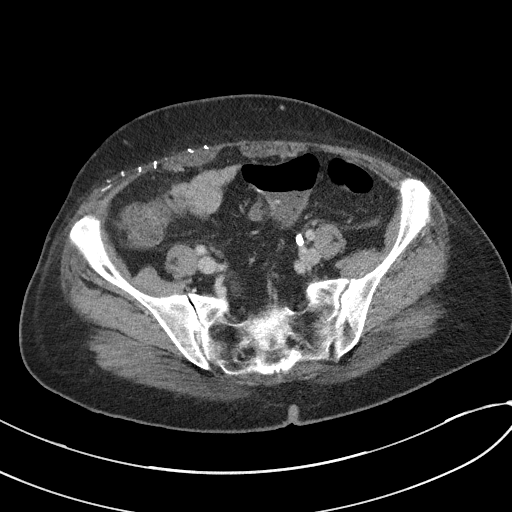
[im 52/103  soft-tissue]
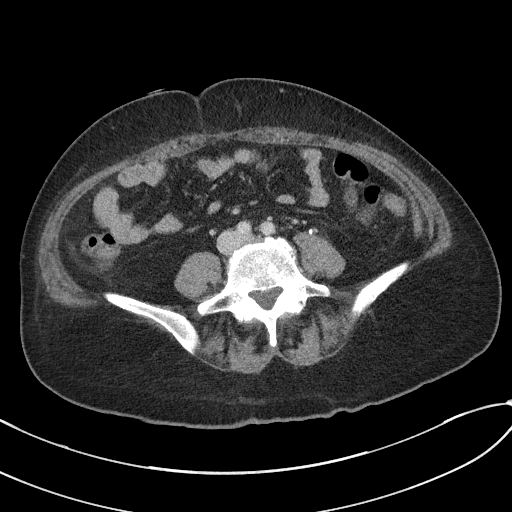
[im 62/103  soft-tissue]
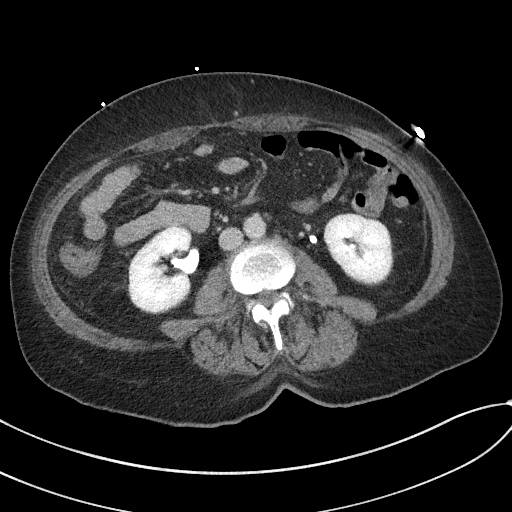
[im 62/103  lung]
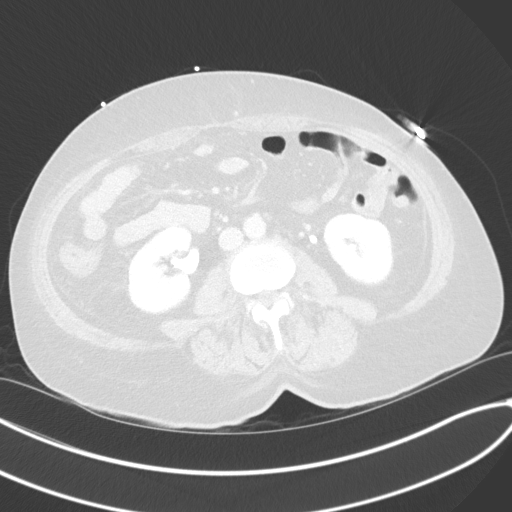
[im 72/103  soft-tissue]
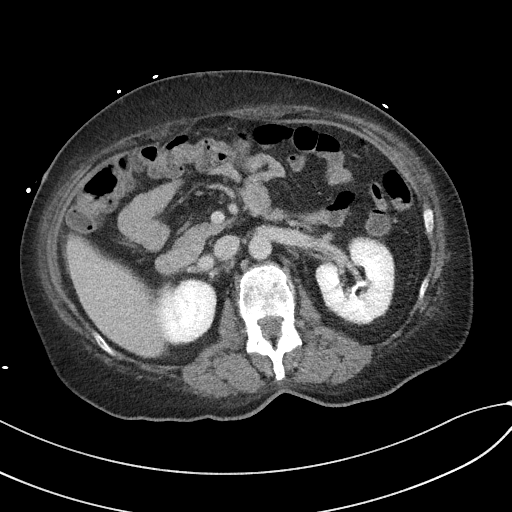
[im 72/103  lung]
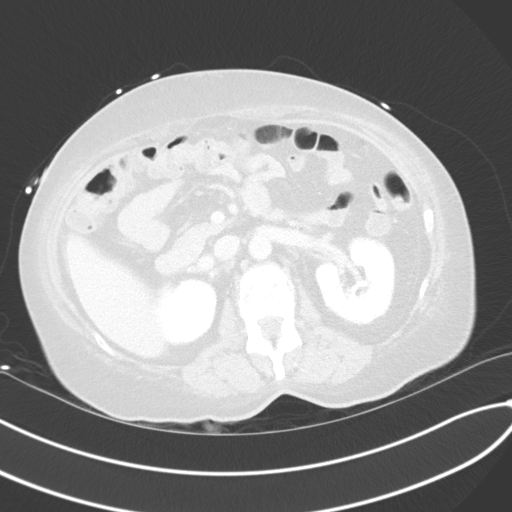
[im 82/103  soft-tissue]
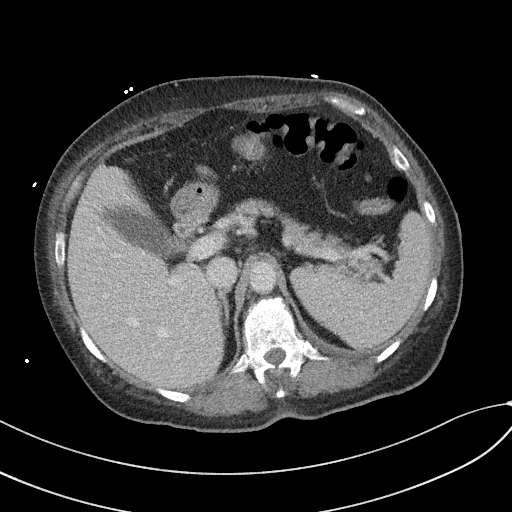
[im 82/103  lung]
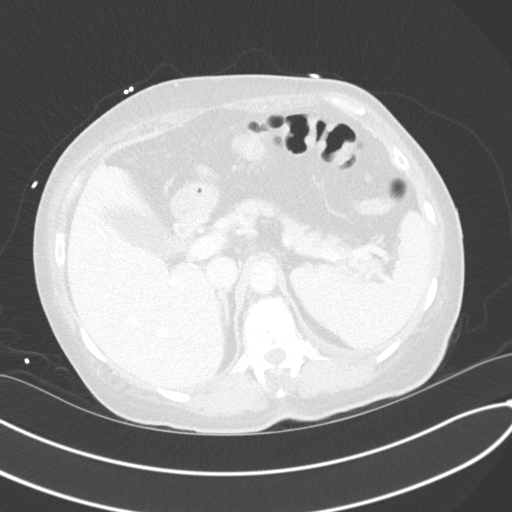
[im 92/103  soft-tissue]
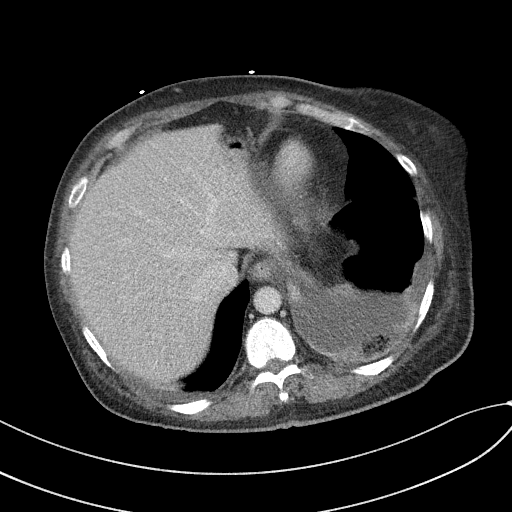
[im 92/103  lung]
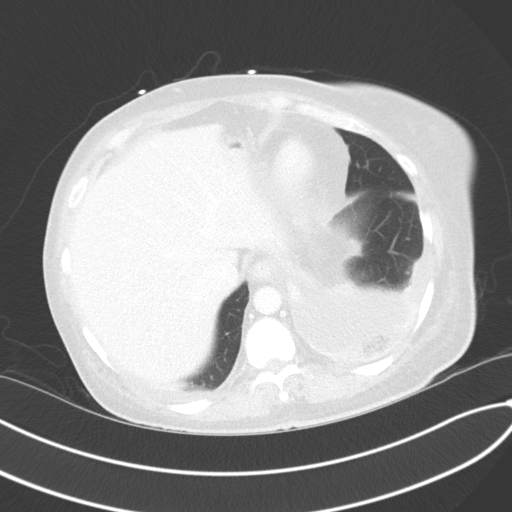
[im 92/103  bone]
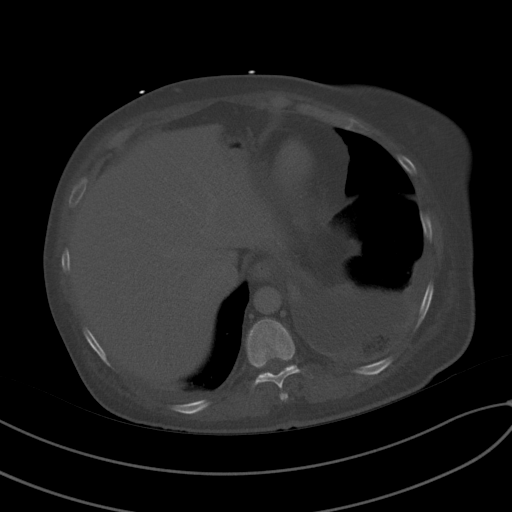

[11 of 46 positions shown; findings below may reference images not displayed]

FINDINGS: VASCULAR

IVC: Widely patent IVC. No evidence of IVC duplication or other
congenital anomaly.

Hepatic/portal veins: Widely patent.

Renal veins: Widely patent.  Normal anatomy.

Visceral veins: IMV and SMV are widely patent and unremarkable.

Iliac veins: Bilateral common, internal and external iliac veins are
widely patent. No evidence of iliac vein compression physiology.

Femoral veins: Marked expansion of the left common femoral vein with
central filling defect consistent with DVT. DVT extends into the
profunda and femoral vein as well as just into the saphenofemoral
junction. There is associated inflammatory stranding within the
perivenous fat. The right femoral veins are patent and unremarkable.

NON VASCULAR

Lower chest: Small left pleural effusion with associated left lower
lobe atelectasis. No evidence of pulmonary infarction. Limited
visualization of the left lower lobe pulmonary arteries demonstrates
some residual linear filling defects consistent with evolving
pulmonary emboli. The heart is normal in size. No evidence of right
heart strain.

Hepatobiliary: Geographic hypoattenuation in the left hemi-liver
adjacent to the fissure for the falciform ligament is nonspecific
but most suggestive of benign focal fatty infiltration. Normal
hepatic contour and morphology. No discrete hepatic lesions. Normal
appearance of the gallbladder. No intra or extrahepatic biliary
ductal dilatation.

Pancreas: No mass or inflammatory changes

Spleen: Normal size and morphology.

Genitourinary: Normal adrenal glands. No evidence of hydronephrosis,
nephrolithiasis or enhancing renal mass. The ureters and kidneys are
also unremarkable. The bladder is normal.

Bowel: No evidence of obstruction or focal bowel wall thickening.
The terminal ileum is unremarkable.

Reproductive: Normal uterus and adnexa.  No adnexal mass.

Other: No significant ascites or abdominal wall hernia. Edema
present in the left upper thigh and inguinal region.

Musculoskeletal: No acute fracture or aggressive appearing lytic or
blastic osseous lesion.
IMPRESSION: 1. No evidence of iliac vein compression physiology or ileo caval
DVT.
2. Positive for occlusive DVT in the left common femoral vein,
femoral vein, profunda femoral vein and saphenofemoral junction with
associated left upper thigh edema.
3. Small left pleural effusion with associated left lower lobe
atelectasis.
4. Expected evolution of pulmonary emboli within the visualized
pulmonary arteries. No evidence of right heart strain.

## 2023-08-02 IMAGING — CR DG CHEST 1V PORT
1 series · 1 of 1 positions shown · non-contrast
Comparison: 03/06/2021.

CLINICAL DATA: Shortness of breath.

EXAM:
PORTABLE CHEST 1 VIEW

[x chest ap]
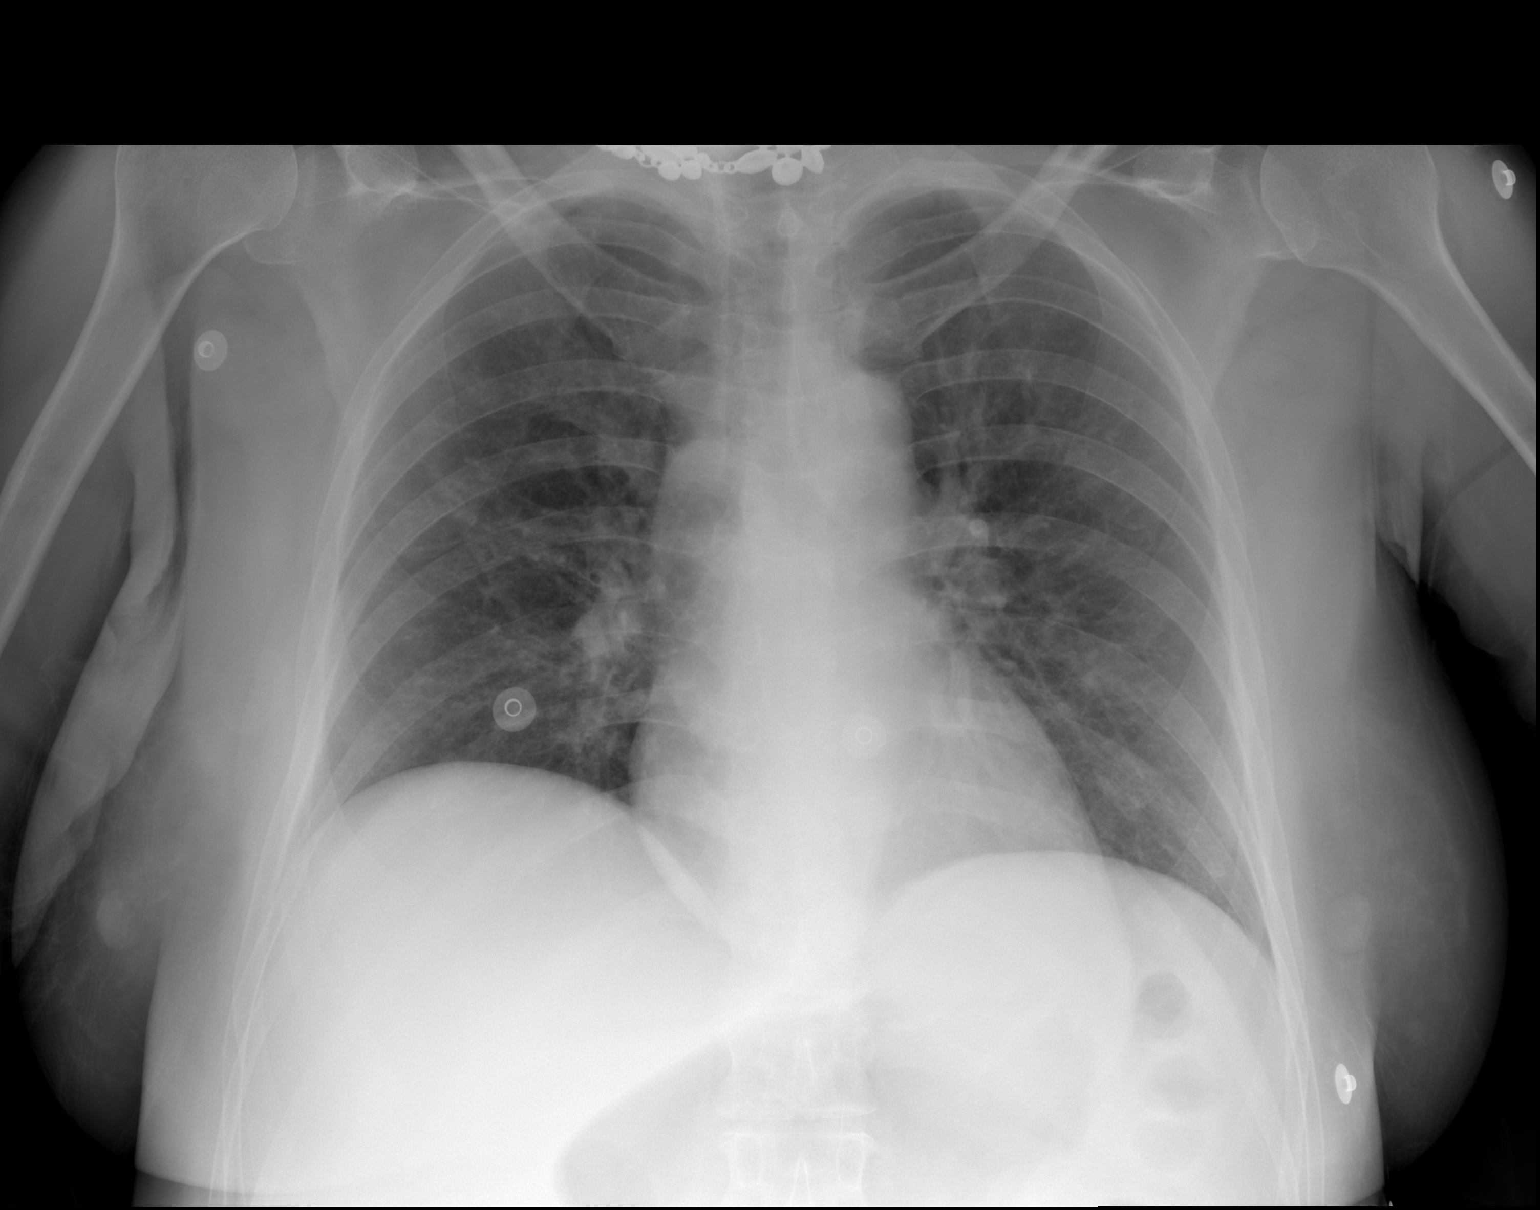

[1 of 1 positions shown; findings below may reference images not displayed]

FINDINGS: The heart size and mediastinal contours are within normal limits.
Both lungs are clear. No acute osseous abnormality.
IMPRESSION: No acute cardiopulmonary process.

## 2024-03-02 IMAGING — CR DG CHEST 2V
2 series · 2 of 2 positions shown · non-contrast
Comparison: 01/06/2022.

CLINICAL DATA: Chest pain.

EXAM:
CHEST - 2 VIEW

[chest pa]
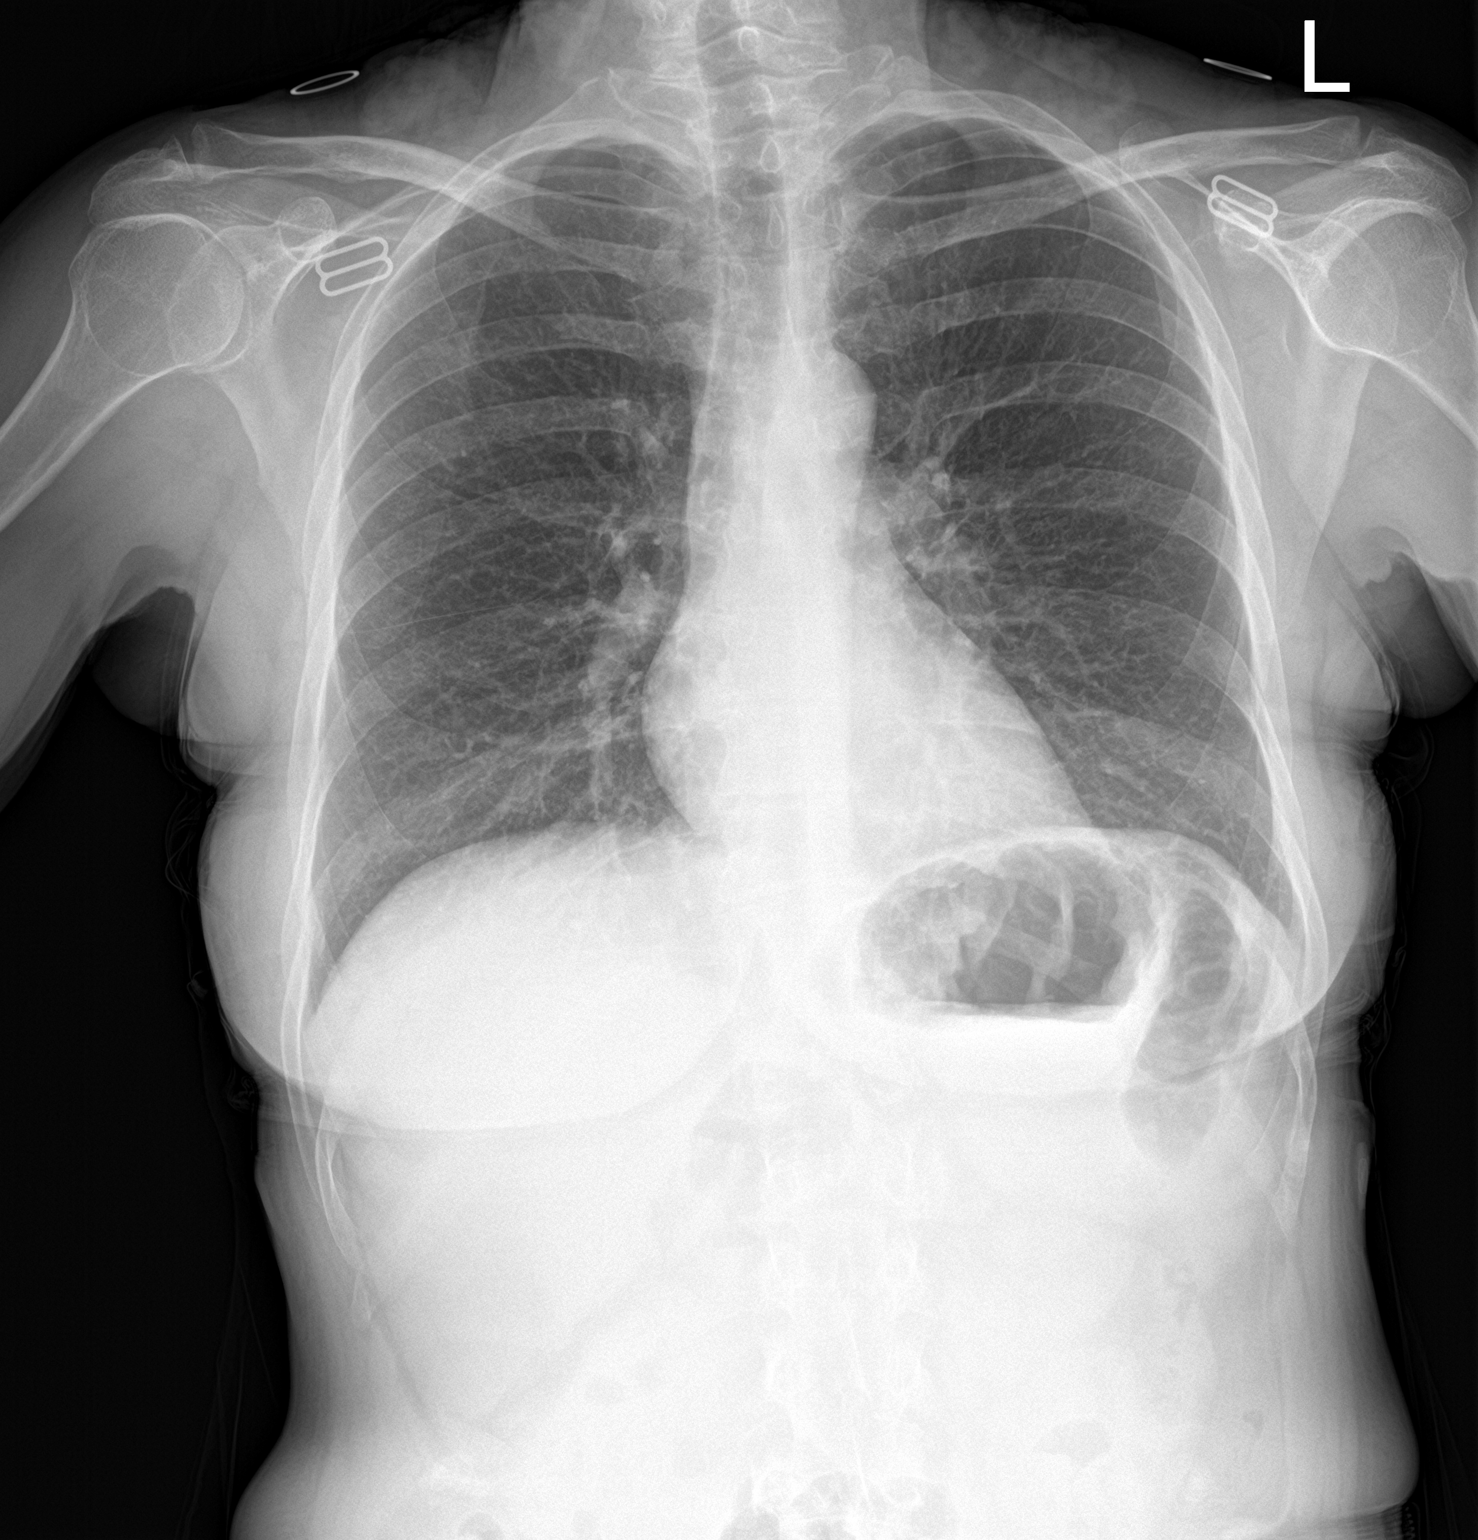

[chest lat]
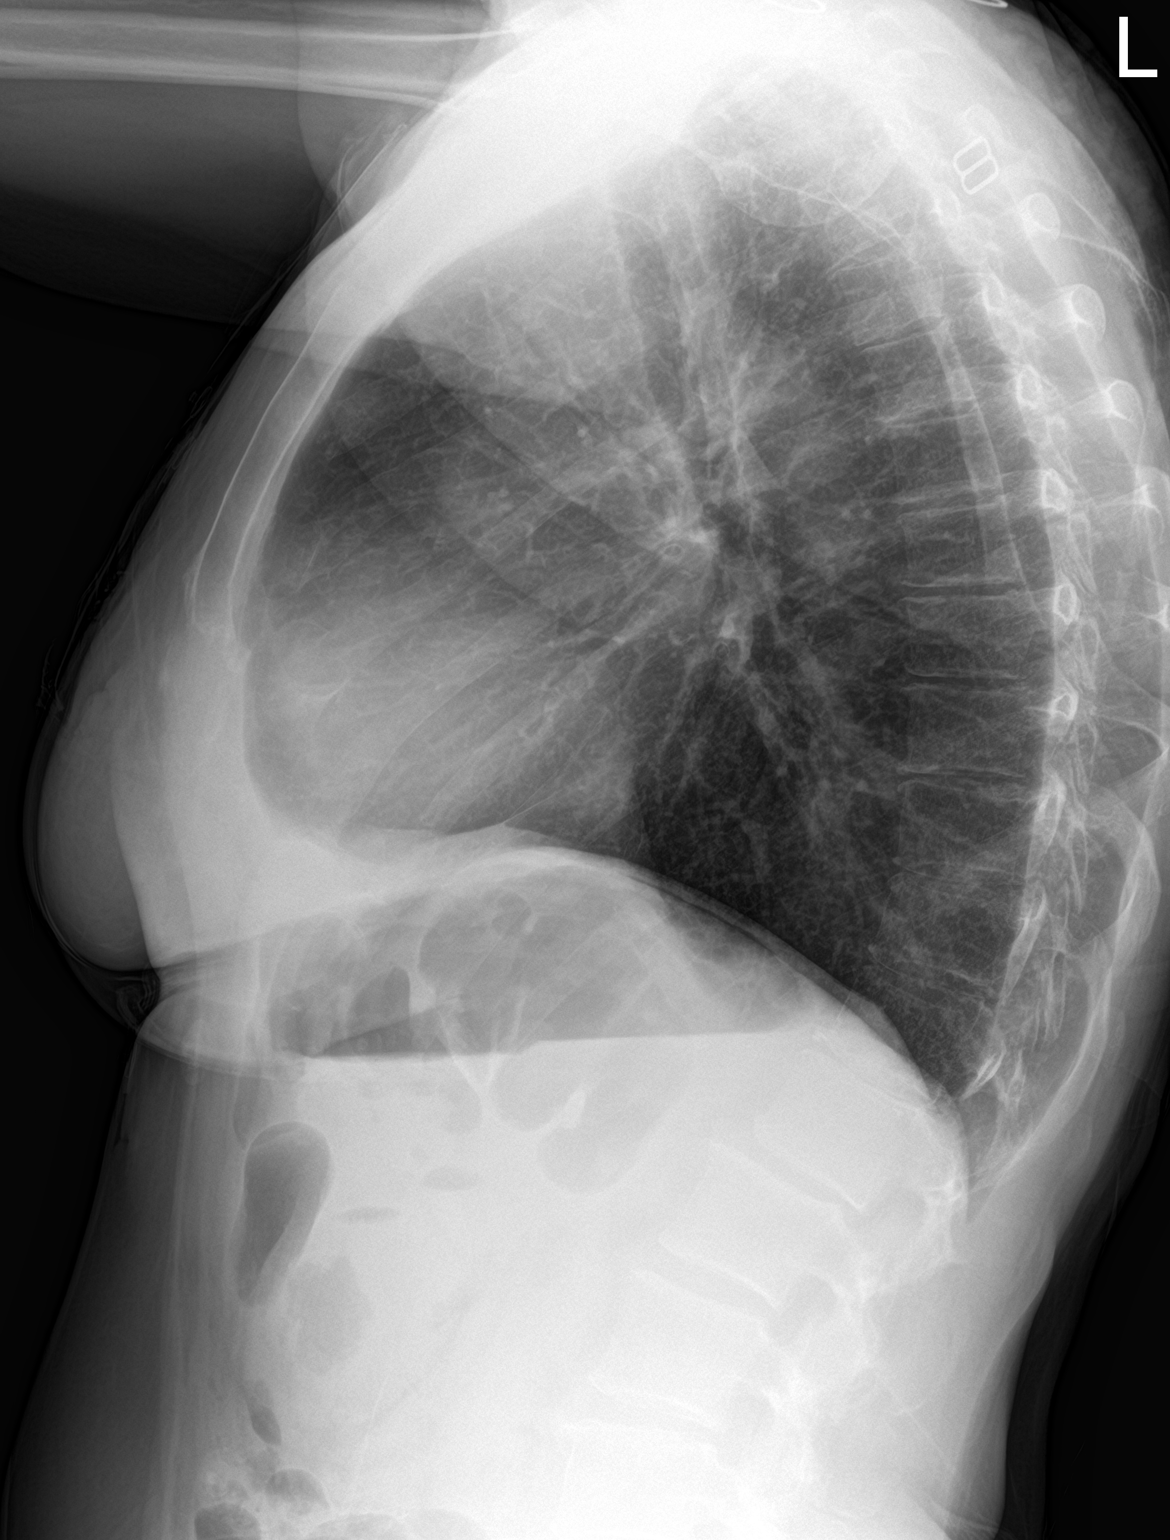

[2 of 2 positions shown; findings below may reference images not displayed]

FINDINGS: The heart size and mediastinal contours are within normal limits.
Mild atherosclerotic calcification of the aorta. No consolidation,
effusion, or pneumothorax. Mild degenerative changes in the thoracic
spine. No acute osseous abnormality.
IMPRESSION: No active cardiopulmonary disease.
